# Patient Record
Sex: Male | Born: 1935 | Race: White | Hispanic: No | Marital: Married | Smoking: Never smoker
Health system: Southern US, Community
[De-identification: ages and names within clinical notes are randomized; demographics above are authoritative.]

## PROBLEM LIST (undated history)

## (undated) DIAGNOSIS — I10 Essential (primary) hypertension: Secondary | ICD-10-CM

## (undated) DIAGNOSIS — N400 Enlarged prostate without lower urinary tract symptoms: Secondary | ICD-10-CM

## (undated) DIAGNOSIS — Z9289 Personal history of other medical treatment: Secondary | ICD-10-CM

## (undated) DIAGNOSIS — R972 Elevated prostate specific antigen [PSA]: Secondary | ICD-10-CM

## (undated) DIAGNOSIS — G40909 Epilepsy, unspecified, not intractable, without status epilepticus: Secondary | ICD-10-CM

## (undated) DIAGNOSIS — E785 Hyperlipidemia, unspecified: Secondary | ICD-10-CM

## (undated) DIAGNOSIS — R739 Hyperglycemia, unspecified: Secondary | ICD-10-CM

## (undated) HISTORY — DX: Hyperlipidemia, unspecified: E78.5

## (undated) HISTORY — DX: Hyperglycemia, unspecified: R73.9

## (undated) HISTORY — DX: Benign prostatic hyperplasia without lower urinary tract symptoms: R97.20

## (undated) HISTORY — DX: Epilepsy, unspecified, not intractable, without status epilepticus: G40.909

## (undated) HISTORY — DX: Personal history of other medical treatment: Z92.89

## (undated) HISTORY — PX: TONSILLECTOMY: SUR1361

## (undated) HISTORY — DX: Essential (primary) hypertension: I10

## (undated) HISTORY — DX: Elevated prostate specific antigen (PSA): N40.0

---

## 1979-12-22 HISTORY — PX: OTHER SURGICAL HISTORY: SHX169

## 1983-12-22 HISTORY — PX: OTHER SURGICAL HISTORY: SHX169

## 1990-12-21 DIAGNOSIS — Z9289 Personal history of other medical treatment: Secondary | ICD-10-CM

## 1990-12-21 HISTORY — DX: Personal history of other medical treatment: Z92.89

## 1996-10-21 ENCOUNTER — Encounter: Payer: Self-pay | Admitting: Family Medicine

## 2002-05-21 ENCOUNTER — Encounter: Payer: Self-pay | Admitting: Family Medicine

## 2003-01-21 ENCOUNTER — Encounter: Payer: Self-pay | Admitting: Family Medicine

## 2003-01-21 LAB — CONVERTED CEMR LAB: PSA: 2.4 ng/mL

## 2004-10-27 ENCOUNTER — Ambulatory Visit: Payer: Self-pay | Admitting: Family Medicine

## 2005-01-21 ENCOUNTER — Encounter: Payer: Self-pay | Admitting: Family Medicine

## 2005-02-12 ENCOUNTER — Ambulatory Visit: Payer: Self-pay | Admitting: Family Medicine

## 2005-02-17 ENCOUNTER — Ambulatory Visit: Payer: Self-pay | Admitting: Family Medicine

## 2005-03-10 ENCOUNTER — Ambulatory Visit: Payer: Self-pay | Admitting: Family Medicine

## 2005-05-07 ENCOUNTER — Ambulatory Visit: Payer: Self-pay | Admitting: Ophthalmology

## 2005-05-13 ENCOUNTER — Ambulatory Visit: Payer: Self-pay | Admitting: Ophthalmology

## 2005-05-21 ENCOUNTER — Ambulatory Visit: Payer: Self-pay | Admitting: Family Medicine

## 2005-07-21 ENCOUNTER — Encounter: Payer: Self-pay | Admitting: Family Medicine

## 2005-07-21 LAB — CONVERTED CEMR LAB: PSA: 4.5 ng/mL

## 2005-08-18 ENCOUNTER — Ambulatory Visit: Payer: Self-pay | Admitting: Family Medicine

## 2005-08-20 ENCOUNTER — Ambulatory Visit: Payer: Self-pay | Admitting: Family Medicine

## 2005-10-21 HISTORY — PX: TRANSURETHRAL RESECTION OF PROSTATE: SHX73

## 2005-10-22 ENCOUNTER — Other Ambulatory Visit: Payer: Self-pay

## 2005-10-22 ENCOUNTER — Ambulatory Visit: Payer: Self-pay | Admitting: Urology

## 2005-10-26 ENCOUNTER — Ambulatory Visit: Payer: Self-pay | Admitting: Urology

## 2005-10-31 ENCOUNTER — Inpatient Hospital Stay: Payer: Self-pay | Admitting: Urology

## 2005-10-31 ENCOUNTER — Other Ambulatory Visit: Payer: Self-pay

## 2005-11-09 ENCOUNTER — Ambulatory Visit: Payer: Self-pay | Admitting: Urology

## 2006-01-21 ENCOUNTER — Encounter: Payer: Self-pay | Admitting: Family Medicine

## 2006-01-21 DIAGNOSIS — R739 Hyperglycemia, unspecified: Secondary | ICD-10-CM

## 2006-01-21 HISTORY — DX: Hyperglycemia, unspecified: R73.9

## 2006-01-21 LAB — CONVERTED CEMR LAB: PSA: 2.91 ng/mL

## 2006-02-08 ENCOUNTER — Ambulatory Visit: Payer: Self-pay | Admitting: Family Medicine

## 2006-02-10 ENCOUNTER — Ambulatory Visit: Payer: Self-pay | Admitting: Family Medicine

## 2006-08-10 ENCOUNTER — Ambulatory Visit: Payer: Self-pay | Admitting: Family Medicine

## 2007-02-11 ENCOUNTER — Ambulatory Visit: Payer: Self-pay | Admitting: Family Medicine

## 2007-02-11 LAB — CONVERTED CEMR LAB
ALT: 23 units/L (ref 0–40)
AST: 25 units/L (ref 0–37)
Alkaline Phosphatase: 114 units/L (ref 39–117)
Basophils Relative: 0.4 % (ref 0.0–1.0)
Bilirubin, Direct: 0.1 mg/dL (ref 0.0–0.3)
Calcium: 8.7 mg/dL (ref 8.4–10.5)
Chloride: 105 meq/L (ref 96–112)
Eosinophils Absolute: 0.1 10*3/uL (ref 0.0–0.6)
Eosinophils Relative: 1.3 % (ref 0.0–5.0)
GFR calc non Af Amer: 58 mL/min
Glucose, Bld: 111 mg/dL — ABNORMAL HIGH (ref 70–99)
MCV: 87 fL (ref 78.0–100.0)
PSA: 4.37 ng/mL
Platelets: 176 10*3/uL (ref 150–400)
RBC: 4.47 M/uL (ref 4.22–5.81)
T3, Free: 2.5 pg/mL (ref 2.3–4.2)
TSH: 0.2 microintl units/mL — ABNORMAL LOW (ref 0.35–5.50)
Total CHOL/HDL Ratio: 3.8
Triglycerides: 92 mg/dL (ref 0–149)
VLDL: 18 mg/dL (ref 0–40)
WBC: 5.3 10*3/uL (ref 4.5–10.5)

## 2007-02-15 ENCOUNTER — Ambulatory Visit: Payer: Self-pay | Admitting: Family Medicine

## 2007-03-15 ENCOUNTER — Ambulatory Visit: Payer: Self-pay | Admitting: Family Medicine

## 2007-05-17 ENCOUNTER — Encounter: Payer: Self-pay | Admitting: Family Medicine

## 2007-05-17 DIAGNOSIS — E785 Hyperlipidemia, unspecified: Secondary | ICD-10-CM | POA: Insufficient documentation

## 2007-05-17 DIAGNOSIS — G250 Essential tremor: Secondary | ICD-10-CM | POA: Insufficient documentation

## 2007-05-17 DIAGNOSIS — R972 Elevated prostate specific antigen [PSA]: Secondary | ICD-10-CM

## 2007-05-17 DIAGNOSIS — M4722 Other spondylosis with radiculopathy, cervical region: Secondary | ICD-10-CM

## 2007-05-17 DIAGNOSIS — I10 Essential (primary) hypertension: Secondary | ICD-10-CM | POA: Insufficient documentation

## 2007-05-17 DIAGNOSIS — G252 Other specified forms of tremor: Secondary | ICD-10-CM

## 2007-05-17 DIAGNOSIS — E041 Nontoxic single thyroid nodule: Secondary | ICD-10-CM | POA: Insufficient documentation

## 2007-05-17 DIAGNOSIS — M4712 Other spondylosis with myelopathy, cervical region: Secondary | ICD-10-CM | POA: Insufficient documentation

## 2007-05-17 DIAGNOSIS — N400 Enlarged prostate without lower urinary tract symptoms: Secondary | ICD-10-CM | POA: Insufficient documentation

## 2007-05-17 DIAGNOSIS — E782 Mixed hyperlipidemia: Secondary | ICD-10-CM | POA: Insufficient documentation

## 2007-05-17 DIAGNOSIS — R7989 Other specified abnormal findings of blood chemistry: Secondary | ICD-10-CM | POA: Insufficient documentation

## 2007-05-17 DIAGNOSIS — G40909 Epilepsy, unspecified, not intractable, without status epilepticus: Secondary | ICD-10-CM | POA: Insufficient documentation

## 2007-05-23 ENCOUNTER — Ambulatory Visit: Payer: Self-pay | Admitting: Family Medicine

## 2007-05-23 LAB — CONVERTED CEMR LAB
CO2: 31 meq/L (ref 19–32)
Calcium: 9.2 mg/dL (ref 8.4–10.5)
Chloride: 110 meq/L (ref 96–112)
Creatinine, Ser: 0.9 mg/dL (ref 0.4–1.5)
Glucose, Bld: 92 mg/dL (ref 70–99)

## 2007-05-24 ENCOUNTER — Ambulatory Visit: Payer: Self-pay | Admitting: Family Medicine

## 2008-01-05 ENCOUNTER — Telehealth (INDEPENDENT_AMBULATORY_CARE_PROVIDER_SITE_OTHER): Payer: Self-pay | Admitting: *Deleted

## 2008-01-19 ENCOUNTER — Encounter: Admission: RE | Admit: 2008-01-19 | Discharge: 2008-01-19 | Payer: Self-pay | Admitting: Family Medicine

## 2008-01-19 ENCOUNTER — Ambulatory Visit: Payer: Self-pay | Admitting: Family Medicine

## 2008-01-19 LAB — CONVERTED CEMR LAB
Albumin: 4.2 g/dL (ref 3.5–5.2)
Basophils Absolute: 0 10*3/uL (ref 0.0–0.1)
Bilirubin, Direct: 0.1 mg/dL (ref 0.0–0.3)
Creatinine, Ser: 1.2 mg/dL (ref 0.4–1.5)
Eosinophils Absolute: 0.1 10*3/uL (ref 0.0–0.6)
Eosinophils Relative: 1.1 % (ref 0.0–5.0)
GFR calc non Af Amer: 64 mL/min
Glucose, Bld: 105 mg/dL — ABNORMAL HIGH (ref 70–99)
HCT: 41.6 % (ref 39.0–52.0)
Hemoglobin: 13.9 g/dL (ref 13.0–17.0)
Lipase: 23 units/L (ref 11.0–59.0)
MCHC: 33.5 g/dL (ref 30.0–36.0)
MCV: 88.2 fL (ref 78.0–100.0)
Monocytes Absolute: 0.7 10*3/uL (ref 0.2–0.7)
Neutrophils Relative %: 67.1 % (ref 43.0–77.0)
Potassium: 4 meq/L (ref 3.5–5.1)
Sodium: 141 meq/L (ref 135–145)
Total Bilirubin: 0.8 mg/dL (ref 0.3–1.2)
WBC: 7.6 10*3/uL (ref 4.5–10.5)

## 2008-01-23 ENCOUNTER — Ambulatory Visit: Payer: Self-pay | Admitting: Family Medicine

## 2008-01-23 DIAGNOSIS — B029 Zoster without complications: Secondary | ICD-10-CM | POA: Insufficient documentation

## 2008-01-25 ENCOUNTER — Encounter: Admission: RE | Admit: 2008-01-25 | Discharge: 2008-01-25 | Payer: Self-pay | Admitting: Family Medicine

## 2008-02-22 ENCOUNTER — Ambulatory Visit: Payer: Self-pay | Admitting: Family Medicine

## 2008-02-23 ENCOUNTER — Ambulatory Visit: Payer: Self-pay | Admitting: Family Medicine

## 2008-02-23 LAB — CONVERTED CEMR LAB
ALT: 20 units/L (ref 0–53)
AST: 19 units/L (ref 0–37)
Basophils Relative: 0.6 % (ref 0.0–1.0)
Bilirubin, Direct: 0.1 mg/dL (ref 0.0–0.3)
CO2: 28 meq/L (ref 19–32)
Calcium: 9.3 mg/dL (ref 8.4–10.5)
Chloride: 102 meq/L (ref 96–112)
Cholesterol: 208 mg/dL (ref 0–200)
Direct LDL: 147.4 mg/dL
Eosinophils Relative: 1.4 % (ref 0.0–5.0)
Glucose, Bld: 98 mg/dL (ref 70–99)
HCT: 41.4 % (ref 39.0–52.0)
Hemoglobin: 13.8 g/dL (ref 13.0–17.0)
MCHC: 33.4 g/dL (ref 30.0–36.0)
MCV: 89.8 fL (ref 78.0–100.0)
Monocytes Relative: 9.4 % (ref 3.0–11.0)
Platelets: 198 10*3/uL (ref 150–400)
RBC: 4.6 M/uL (ref 4.22–5.81)
TSH: 0.2 microintl units/mL — ABNORMAL LOW (ref 0.35–5.50)
Total CHOL/HDL Ratio: 5
Total Protein: 6.5 g/dL (ref 6.0–8.3)

## 2008-03-23 ENCOUNTER — Ambulatory Visit: Payer: Self-pay | Admitting: Family Medicine

## 2008-03-23 LAB — CONVERTED CEMR LAB
OCCULT 2: NEGATIVE
OCCULT 3: NEGATIVE

## 2008-03-26 ENCOUNTER — Encounter (INDEPENDENT_AMBULATORY_CARE_PROVIDER_SITE_OTHER): Payer: Self-pay | Admitting: *Deleted

## 2008-12-03 ENCOUNTER — Telehealth: Payer: Self-pay | Admitting: Family Medicine

## 2009-03-01 ENCOUNTER — Ambulatory Visit: Payer: Self-pay | Admitting: Family Medicine

## 2009-03-01 LAB — CONVERTED CEMR LAB
ALT: 22 units/L (ref 0–53)
AST: 29 units/L (ref 0–37)
Albumin: 3.5 g/dL (ref 3.5–5.2)
BUN: 17 mg/dL (ref 6–23)
Basophils Relative: 0.6 % (ref 0.0–3.0)
CO2: 29 meq/L (ref 19–32)
Chloride: 108 meq/L (ref 96–112)
Creatinine, Ser: 0.8 mg/dL (ref 0.4–1.5)
Creatinine,U: 136.7 mg/dL
Eosinophils Absolute: 0.1 10*3/uL (ref 0.0–0.7)
Eosinophils Relative: 1.9 % (ref 0.0–5.0)
GFR calc non Af Amer: 101 mL/min
Glucose, Bld: 93 mg/dL (ref 70–99)
MCV: 87.1 fL (ref 78.0–100.0)
Monocytes Relative: 8.1 % (ref 3.0–12.0)
Neutrophils Relative %: 67 % (ref 43.0–77.0)
RBC: 4.32 M/uL (ref 4.22–5.81)
TSH: 0.23 microintl units/mL — ABNORMAL LOW (ref 0.35–5.50)
Total Protein: 6.1 g/dL (ref 6.0–8.3)
VLDL: 17 mg/dL (ref 0–40)
WBC: 6.7 10*3/uL (ref 4.5–10.5)

## 2009-03-04 ENCOUNTER — Encounter: Payer: Self-pay | Admitting: Family Medicine

## 2009-03-04 LAB — CONVERTED CEMR LAB
PSA, Free Pct: 32 (ref >25)
PSA, Free: 1.8 ng/mL
PSA: 5.68 ng/mL — ABNORMAL HIGH (ref 0.10–4.00)

## 2009-03-05 ENCOUNTER — Ambulatory Visit: Payer: Self-pay | Admitting: Family Medicine

## 2009-03-05 DIAGNOSIS — R972 Elevated prostate specific antigen [PSA]: Secondary | ICD-10-CM | POA: Insufficient documentation

## 2009-04-03 ENCOUNTER — Ambulatory Visit: Payer: Self-pay | Admitting: Family Medicine

## 2009-04-03 ENCOUNTER — Encounter (INDEPENDENT_AMBULATORY_CARE_PROVIDER_SITE_OTHER): Payer: Self-pay | Admitting: *Deleted

## 2009-04-03 LAB — CONVERTED CEMR LAB
OCCULT 1: NEGATIVE
OCCULT 2: NEGATIVE

## 2009-05-29 ENCOUNTER — Ambulatory Visit: Payer: Self-pay | Admitting: Family Medicine

## 2009-06-27 ENCOUNTER — Encounter: Payer: Self-pay | Admitting: Family Medicine

## 2009-07-22 ENCOUNTER — Ambulatory Visit: Payer: Self-pay | Admitting: Family Medicine

## 2009-08-12 ENCOUNTER — Encounter: Payer: Self-pay | Admitting: Family Medicine

## 2009-08-19 ENCOUNTER — Encounter: Payer: Self-pay | Admitting: Family Medicine

## 2010-03-05 ENCOUNTER — Encounter: Payer: Self-pay | Admitting: Family Medicine

## 2010-03-06 ENCOUNTER — Ambulatory Visit: Payer: Self-pay | Admitting: Family Medicine

## 2010-03-06 LAB — CONVERTED CEMR LAB
ALT: 15 units/L (ref 0–53)
Albumin: 3.8 g/dL (ref 3.5–5.2)
Alkaline Phosphatase: 111 units/L (ref 39–117)
Basophils Relative: 0.4 % (ref 0.0–3.0)
Bilirubin, Direct: 0.1 mg/dL (ref 0.0–0.3)
CO2: 29 meq/L (ref 19–32)
Chloride: 107 meq/L (ref 96–112)
Creatinine,U: 109.1 mg/dL
Eosinophils Absolute: 0.2 10*3/uL (ref 0.0–0.7)
Eosinophils Relative: 3.4 % (ref 0.0–5.0)
Free T4: 0.6 ng/dL (ref 0.6–1.6)
Hemoglobin: 12.7 g/dL — ABNORMAL LOW (ref 13.0–17.0)
LDL Cholesterol: 122 mg/dL — ABNORMAL HIGH (ref 0–99)
Lymphocytes Relative: 24.6 % (ref 12.0–46.0)
MCHC: 34.1 g/dL (ref 30.0–36.0)
MCV: 89.1 fL (ref 78.0–100.0)
Microalb Creat Ratio: 9.2 mg/g (ref 0.0–30.0)
Neutro Abs: 4.1 10*3/uL (ref 1.4–7.7)
Neutrophils Relative %: 63.3 % (ref 43.0–77.0)
RBC: 4.2 M/uL — ABNORMAL LOW (ref 4.22–5.81)
Sodium: 141 meq/L (ref 135–145)
Total CHOL/HDL Ratio: 4
Total Protein: 6.6 g/dL (ref 6.0–8.3)
WBC: 6.3 10*3/uL (ref 4.5–10.5)

## 2010-03-09 LAB — CONVERTED CEMR LAB: Vit D, 25-Hydroxy: 26 ng/mL — ABNORMAL LOW (ref 30–89)

## 2010-03-19 ENCOUNTER — Ambulatory Visit: Payer: Self-pay | Admitting: Family Medicine

## 2010-04-04 ENCOUNTER — Ambulatory Visit: Payer: Self-pay | Admitting: Family Medicine

## 2010-04-04 LAB — CONVERTED CEMR LAB
OCCULT 1: NEGATIVE
OCCULT 2: NEGATIVE
OCCULT 3: NEGATIVE

## 2010-04-07 ENCOUNTER — Encounter (INDEPENDENT_AMBULATORY_CARE_PROVIDER_SITE_OTHER): Payer: Self-pay | Admitting: *Deleted

## 2010-07-24 ENCOUNTER — Encounter (INDEPENDENT_AMBULATORY_CARE_PROVIDER_SITE_OTHER): Payer: Self-pay | Admitting: *Deleted

## 2010-09-03 ENCOUNTER — Encounter: Payer: Self-pay | Admitting: Family Medicine

## 2010-12-21 HISTORY — PX: CATARACT EXTRACTION W/ INTRAOCULAR LENS IMPLANT: SHX1309

## 2011-01-21 NOTE — Letter (Signed)
Summary: Dr.Brian Cope,[facility],Note  Dr.Brian Cope,[facility],Note   Imported By: Beau Fanny 03/10/2010 16:51:59  _____________________________________________________________________  External Attachment:    Type:   Image     Comment:   External Document

## 2011-01-21 NOTE — Letter (Signed)
Summary: [facility]  [facility]   Imported By: Sherian Rein 09/09/2010 14:43:34  _____________________________________________________________________  External Attachment:    Type:   Image     Comment:   External Document  Appended Document: [facility]    Clinical Lists Changes  Observations: Added new observation of PAST MED HX: Hyperlipidemia Hypertension Benign prostatic hypertrophy and elevated PSA per Dr. Achilles Dunk Seizure disorder Elevated glucose- 102 (01/2006)  (09/10/2010 15:33)       Past History:  Past Medical History: Hyperlipidemia Hypertension Benign prostatic hypertrophy and elevated PSA per Dr. Achilles Dunk Seizure disorder Elevated glucose- 102 (01/2006)

## 2011-01-21 NOTE — Letter (Signed)
Summary: Nadara Eaton letter  [place] [facility]  [address] [place], [place] [address]   Phone: [phone]  Fax: [phone]       07/24/2010 MRN: [id number]  JAMIEON LANNEN [address] [place], [place]  [address]  Dear Mr. [facility] [place] [place] announce the retirement of Arta Silence, M.D., from full-time practice at the [facility] office effective June 19, 2010 and his plans of returning part-time.  It is important to Dr. Hetty Ely and to our practice that you understand that [facility] has seven physicians in our office for your health care needs.  We will continue to offer the same exceptional care that you have today.    Dr. Hetty Ely has spoken to many of you about his plans for retirement and returning part-time in the fall.   We will continue to work with you through the transition to schedule appointments for you in the office and meet the high standards that [place] is committed to.   Again, it is with great pleasure that we share the news that Dr. Hetty Ely will return to [facility] at [facility] in October of 2011 with a reduced schedule.    If you have any questions, or would like to request an appointment with one of our physicians, please call us at [phone] and press the option for Scheduling an appointment.  We take pleasure in providing you with excellent patient care and look forward to seeing you at your next office visit.  Our [facility] Physicians are:  Tillman Abide, M.D. Laurita Quint, M.D. Roxy Manns, M.D. Kerby Nora, M.D. Hannah Beat, M.D. Ruthe Mannan, M.D. We proudly welcomed Raechel Ache, M.D. and Eustaquio Boyden, M.D. to the practice in July/August 2011.  Sincerely,  [facility]

## 2011-01-21 NOTE — Assessment & Plan Note (Signed)
Summary: cpx/rbh  R/S FROM 03/10/10   Vital Signs:  Patient profile:   76 year old male Weight:      184.75 pounds Temp:     98.4 degrees F oral Pulse rate:   72 / minute Pulse rhythm:   regular BP sitting:   128 / 70  (left arm) Cuff size:   regular  Vitals Entered By: Sydell Axon LPN (Apr 14, 2010 8:15 AM) CC: 30 Minute checkup, hemoccult cards given to patient   History of Present Illness: Pt here for Comp Exam.  Had a dry cough that is improving with no trmt. He helped feed people and thereafter got a cold again, which is what he is now getting over. He feels well and has no complaints. He has done well with Dr Achilles Dunk and is very pleased with hisw trmt. He is currently on Avodart, shrinking his prostate.   Preventive Screening-Counseling & Management  Alcohol-Tobacco     Alcohol drinks/day: 0     Smoking Status: never     Passive Smoke Exposure: no  Caffeine-Diet-Exercise     Caffeine use/day: 4     Caffeine Counseling: decrease use of caffeine     Does Patient Exercise: yes     Type of exercise: lawnwork and stairs.     Times/week: 7  Problems Prior to Update: 1)  Elevated Prostate Specific Antigen  (ICD-790.93) 2)  Special Screening Malig Neoplasms Other Sites  (ICD-V76.49) 3)  Health Maintenance Exam  (ICD-V70.0) 4)  Herpes Zoster  (ICD-053.9) 5)  Hyperglycemia  (ICD-790.6) 6)  Hyperlipidemia  (ICD-272.4) 7)  Hx of Tremor, Essential  (ICD-333.1) 8)  Hx of Radiculopathy  (ICD-729.2) 9)  Seizure Disorder  (ICD-780.39) 10)  Hx of Thyroid Nodule  (ICD-241.0) 11)  Hx of Erectile Dysfunction Resolved  (ICD-302.72) 12)  Benign Prostatic Hypertrophy  (ICD-600.00) 13)  Hypertension  (ICD-401.9)  Medications Prior to Update: 1)  Dilantin 100 Mg Caps (Phenytoin Sodium Extended) .... Take Two By Mouth Twice A Day 2)  Fish Oil  Caps (Omega-3 Fatty Acids Caps) .... Take By Mouth As Directed 3)  Altace 10 Mg  Caps (Ramipril) .... One Tab By Mouth Once Daily 4)  Terazosin  Hcl 10 Mg  Caps (Terazosin Hcl) .... One Tab By Mouth Daily 5)  Hydrochlorothiazide 12.5 Mg Tabs (Hydrochlorothiazide) .... One Tab By Mouth in Am  Allergies: No Known Drug Allergies  Past History:  Past Medical History: Last updated: 05/17/2007 Hyperlipidemia Hypertension Benign prostatic hypertrophy Seizure disorder Elevated glucose- 102 (01/2006)  Past Surgical History: Last updated: 05/17/2007 Tonsillectomy  HOSP Observation for seizure 1981 CT brain- normal (1992) Elevated PSA (2.4)  10/1996 EEG- normal (1985) Laser TURP  (10/2005)  Family History: Last updated: Apr 14, 2010 Father: died age 51- unknown causes Mother: died age 25- natural causes Sister A 30 (Esytranged)  Febrile seizure  Sister dec 57  lung cancer/smoker  Social History: Last updated: 02/23/2008 Marital Status: Married since 1959 Children: 2 daughters Occupation: Software engineer, retired 2001  Risk Factors: Alcohol Use: 0 (2010-04-14) Caffeine Use: 4 (04-14-2010) Exercise: yes (April 14, 2010)  Risk Factors: Smoking Status: never (04/14/2010) Passive Smoke Exposure: no (Apr 14, 2010)  Family History: Father: died age 59- unknown causes Mother: died age 6- natural causes Sister A 12 (Esytranged)  Febrile seizure  Sister dec 57  lung cancer/smoker  Review of Systems General:  Denies chills, fatigue, fever, sweats, weakness, and weight loss. Eyes:  Complains of blurring; denies discharge, double vision, and eye pain. ENT:  Denies  decreased hearing, earache, hoarseness, and ringing in ears. CV:  Complains of fatigue; denies chest pain or discomfort, fainting, palpitations, shortness of breath with exertion, and swelling of feet; mild more than usual.. Resp:  Denies coughing up blood, shortness of breath, and wheezing. GI:  Denies abdominal pain, bloody stools, change in bowel habits, constipation, dark tarry stools, diarrhea, indigestion, loss of appetite, nausea, vomiting, vomiting blood, and  yellowish skin color. GU:  Complains of nocturia; denies discharge, dysuria, and urinary frequency. MS:  Denies joint pain, low back pain, cramps, muscle weakness, and stiffness. Derm:  Denies dryness, itching, and rash. Neuro:  Denies numbness, poor balance, seizures, tingling, and tremors.  Physical Exam  General:  Well-developed,well-nourished,in no acute distress; alert,appropriate and cooperative throughout examination. Head:  Normocephalic and atraumatic without obvious abnormalities. No apparent alopecia or balding. Sinuses NT. Eyes:  Conjunctiva clear bilaterally.  Ears:  External ear exam shows no significant lesions or deformities.  Otoscopic examination reveals clear canals, tympanic membranes are intact bilaterally without bulging, retraction, inflammation or discharge. Hearing is grossly normal bilaterally. Nose:  External nasal examination shows no deformity or inflammation. Nasal mucosa are pink and moist without lesions or exudates. Mouth:  Oral mucosa and oropharynx without lesions or exudates.  Teeth in good repair. Neck:  No deformities, masses, or tenderness noted. Chest Wall:  No deformities, masses, tenderness or gynecomastia noted. Breasts:  No masses or gynecomastia noted Lungs:  Normal respiratory effort, chest expands symmetrically. Lungs are clear to auscultation, no crackles or wheezes. Heart:  Normal rate and regular rhythm. S1 and S2 normal without gallop, murmur, click, rub or other extra sounds. Abdomen:  Bowel sounds positive,abdomen soft and non-tender without masses, organomegaly or hernias noted. Area of Zoster totally healed and difficult to appreciate. Rectal:  Declined as just done by Dr Achilles Dunk. Genitalia:  Testes bilaterally descended without nodularity, tenderness or masses. No scrotal masses or lesions. No penis lesions or urethral discharge. Prostate:  Declined as just done by Dr Achilles Dunk. Msk:  No deformity or scoliosis noted of thoracic or lumbar spine.    Pulses:  R and L carotid,radial,femoral,dorsalis pedis and posterior tibial pulses are full and equal bilaterally Extremities:  No clubbing, cyanosis, edema, or deformity noted with normal full range of motion of all joints.   Neurologic:  No cranial nerve deficits noted. Station and gait are normal. Sensory, motor and coordinative functions appear intact. Minimal tremor of outstretched hands. Skin:  Intact without suspicious lesions or rashes Cervical Nodes:  No lymphadenopathy noted Inguinal Nodes:  No significant adenopathy Psych:  Cognition and judgment appear intact. Alert and cooperative with normal attention span and concentration. No apparent delusions, illusions, hallucinations   Impression & Recommendations:  Problem # 1:  HEALTH MAINTENANCE EXAM (ICD-V70.0)  Discussed getting Zostavax. Given Td and Pneumonia.  Problem # 2:  ELEVATED PROSTATE SPECIFIC ANTIGEN (ICD-790.93) Assessment: Improved Better today but on Avodart.  Problem # 3:  HERPES ZOSTER (ICD-053.9) Assessment: Improved Resolved. Suggested immun.  Problem # 4:  HYPERGLYCEMIA (ICD-790.6) Assessment: Improved Euglycemic today.  Problem # 5:  HYPERLIPIDEMIA (ICD-272.4) Assessment: Unchanged Acceptable but always want LDL as low as poss. Avoid fatty foods. Labs Reviewed: SGOT: 18 (03/06/2010)   SGPT: 15 (03/06/2010)   HDL:46.70 (03/06/2010), 39.5 (03/01/2009)  LDL:122 (03/06/2010), 126 (03/01/2009)  Chol:200 (03/06/2010), 183 (03/01/2009)  Trig:158.0 (03/06/2010), 87 (03/01/2009)  Problem # 6:  Hx of TREMOR, ESSENTIAL (ICD-333.1) Assessment: Unchanged Stable.  Problem # 7:  SEIZURE DISORDER (ICD-780.39) Assessment: Unchanged Stable...no seizures in many  years. Cont Dilantin. His updated medication list for this problem includes:    Dilantin 100 Mg Caps (Phenytoin sodium extended) .Marland Kitchen... Take two by mouth twice a day  Problem # 8:  BENIGN PROSTATIC HYPERTROPHY (ICD-600.00) Assessment: Improved Sxs  improved.  Problem # 9:  HYPERTENSION (ICD-401.9) Assessment: Unchanged Stable. Cont curr meds. His updated medication list for this problem includes:    Altace 10 Mg Caps (Ramipril) ..... One tab by mouth once daily    Terazosin Hcl 10 Mg Caps (Terazosin hcl) ..... One tab by mouth daily    Hydrochlorothiazide 12.5 Mg Tabs (Hydrochlorothiazide) ..... One tab by mouth in am  BP today: 128/70 Prior BP: 130/64 (07/22/2009)  Labs Reviewed: K+: 4.0 (03/06/2010) Creat: : 1.1 (03/06/2010)   Chol: 200 (03/06/2010)   HDL: 46.70 (03/06/2010)   LDL: 122 (03/06/2010)   TG: 158.0 (03/06/2010)  Complete Medication List: 1)  Dilantin 100 Mg Caps (Phenytoin sodium extended) .... Take two by mouth twice a day 2)  Fish Oil Caps (Omega-3 fatty acids caps) .... Take by mouth as directed 3)  Altace 10 Mg Caps (Ramipril) .... One tab by mouth once daily 4)  Terazosin Hcl 10 Mg Caps (Terazosin hcl) .... One tab by mouth daily 5)  Hydrochlorothiazide 12.5 Mg Tabs (Hydrochlorothiazide) .... One tab by mouth in am 6)  Avodart 0.5 Mg Caps (Dutasteride) .... Take one by mouth every other day  Other Orders: TD Toxoids IM 7 YR + (14782) Admin 1st Vaccine (95621) Pneumococcal Vaccine (30865) Admin of Any Addtl Vaccine (78469)  Patient Instructions: 1)  Please ask Shirlee Limerick about an Training and development officer for pt in [place].  Prescriptions: ALTACE 10 MG  CAPS (RAMIPRIL) one tab by mouth once daily  #90 x 03   Entered and Authorized by:   Shaune Leeks MD   Signed by:   Shaune Leeks MD on 03/19/2010   Method used:   Print then Give to Patient   RxID:   [id number] HYDROCHLOROTHIAZIDE 12.5 MG TABS (HYDROCHLOROTHIAZIDE) one tab by mouth in AM  #90 x 3   Entered by:   Sydell Axon LPN   Authorized by:   Shaune Leeks MD   Signed by:   Sydell Axon LPN on 72/53/6644   Method used:   Print then Give to Patient   RxID:   [id number] TERAZOSIN HCL 10 MG  CAPS (TERAZOSIN HCL) one tab by  mouth daily  #90 x 3   Entered by:   Sydell Axon LPN   Authorized by:   Shaune Leeks MD   Signed by:   Sydell Axon LPN on 33/29/5188   Method used:   Print then Give to Patient   RxID:   [id number] DILANTIN 100 MG CAPS (PHENYTOIN SODIUM EXTENDED) take two by mouth twice a day  #360 x 3   Entered by:   Sydell Axon LPN   Authorized by:   Shaune Leeks MD   Signed by:   Sydell Axon LPN on 35/57/3220   Method used:   Print then Give to Patient   RxID:   [id number]   Current Allergies (reviewed today): No known allergies    Tetanus/Td Vaccine    Vaccine Type: Td    Site: left deltoid    Mfr: Sanofi Pasteur    Dose: 0.5 ml    Route: IM    Given by: Sydell Axon LPN    Exp. Date: 11/05/2011    Lot #: [id number]    VIS given: 11/08/07  version given March 19, 2010.  Pneumovax Vaccine    Vaccine Type: Pneumovax (Medicare)    Site: right deltoid    Mfr: Merck    Dose: 0.5 ml    Route: IM    Given by: Sydell Axon LPN    Exp. Date: 08/04/2011    Lot #: [id number]    VIS given: 07/18/96 version given March 19, 2010.

## 2011-01-21 NOTE — Letter (Signed)
Summary: Results Follow up Letter  [place] [facility]  [address] [place], [place] [address]   Phone: [phone]  Fax: [phone]    04/07/2010 MRN: [id number]    Mark Holmes [address] [place], [place]  [address]    Dear Mr. Holderman,  The following are the results of your recent test(s):  Test         Result    Pap Smear:        Normal _____  Not Normal _____ Comments: ______________________________________________________ Cholesterol: LDL(Bad cholesterol):         Your goal is less than:         HDL (Good cholesterol):       Your goal is more than: Comments:  ______________________________________________________ Mammogram:        Normal _____  Not Normal _____ Comments:  ___________________________________________________________________ Hemoccult:        Normal __X___  Not normal _______ Comments:    Yearly follow up is recommended.   _____________________________________________________________________ Other Tests:    We routinely do not discuss normal results over the telephone.  If you desire a copy of the results, or you have any questions about this information we can discuss them at your next office visit.   Sincerely,    Arta Silence, M.D.  RNS:lsf

## 2011-02-27 ENCOUNTER — Telehealth: Payer: Self-pay | Admitting: Family Medicine

## 2011-03-02 ENCOUNTER — Encounter: Payer: Self-pay | Admitting: Family Medicine

## 2011-03-03 NOTE — Progress Notes (Signed)
Summary: needs written script for dilantin  Phone Note Refill Request Call back at Home Phone [phone] Message from:  Patient  Refills Requested: Medication #1:  DILANTIN 100 MG CAPS take two by mouth twice a day Pt is asking for written script for mail order.  He will pick up, please call when ready.  Initial call taken by: Lowella Petties CMA, AAMA,  February 27, 2011 9:04 AM  Follow-up for Phone Call        please give to patient.  Follow-up by: Crawford Givens MD,  February 27, 2011 10:14 AM  Additional Follow-up for Phone Call Additional follow up Details #1::        Patient Advised. Prescription left at front desk.  Additional Follow-up by: Delilah Shan CMA Nagi Furio Dull),  February 27, 2011 10:35 AM    Prescriptions: DILANTIN 100 MG CAPS (PHENYTOIN SODIUM EXTENDED) take two by mouth twice a day  #360 x 3   Entered and Authorized by:   Crawford Givens MD   Signed by:   Crawford Givens MD on 02/27/2011   Method used:   Print then Give to Patient   RxID:   [id number]

## 2011-03-10 NOTE — Letter (Signed)
Summary: [facility]  [facility]   Imported By: Lanelle Bal 03/05/2011 12:41:15  _____________________________________________________________________  External Attachment:    Type:   Image     Comment:   External Document

## 2011-04-11 ENCOUNTER — Encounter: Payer: Self-pay | Admitting: Family Medicine

## 2011-05-07 ENCOUNTER — Other Ambulatory Visit: Payer: Self-pay | Admitting: Family Medicine

## 2011-05-07 DIAGNOSIS — R569 Unspecified convulsions: Secondary | ICD-10-CM

## 2011-05-07 DIAGNOSIS — R7989 Other specified abnormal findings of blood chemistry: Secondary | ICD-10-CM

## 2011-05-07 DIAGNOSIS — E041 Nontoxic single thyroid nodule: Secondary | ICD-10-CM

## 2011-05-07 DIAGNOSIS — E785 Hyperlipidemia, unspecified: Secondary | ICD-10-CM

## 2011-05-07 DIAGNOSIS — R972 Elevated prostate specific antigen [PSA]: Secondary | ICD-10-CM

## 2011-05-07 DIAGNOSIS — I10 Essential (primary) hypertension: Secondary | ICD-10-CM

## 2011-05-11 ENCOUNTER — Other Ambulatory Visit (INDEPENDENT_AMBULATORY_CARE_PROVIDER_SITE_OTHER): Payer: 59 | Admitting: Family Medicine

## 2011-05-11 DIAGNOSIS — E785 Hyperlipidemia, unspecified: Secondary | ICD-10-CM

## 2011-05-11 DIAGNOSIS — I10 Essential (primary) hypertension: Secondary | ICD-10-CM

## 2011-05-11 DIAGNOSIS — E041 Nontoxic single thyroid nodule: Secondary | ICD-10-CM

## 2011-05-11 DIAGNOSIS — R569 Unspecified convulsions: Secondary | ICD-10-CM

## 2011-05-11 DIAGNOSIS — R7989 Other specified abnormal findings of blood chemistry: Secondary | ICD-10-CM

## 2011-05-11 DIAGNOSIS — R972 Elevated prostate specific antigen [PSA]: Secondary | ICD-10-CM

## 2011-05-11 LAB — CBC WITH DIFFERENTIAL/PLATELET
Basophils Relative: 0.4 % (ref 0.0–3.0)
Eosinophils Relative: 1.2 % (ref 0.0–5.0)
HCT: 37.5 % — ABNORMAL LOW (ref 39.0–52.0)
Hemoglobin: 12.9 g/dL — ABNORMAL LOW (ref 13.0–17.0)
Lymphs Abs: 1.4 10*3/uL (ref 0.7–4.0)
MCV: 90.6 fl (ref 78.0–100.0)
Monocytes Absolute: 0.5 10*3/uL (ref 0.1–1.0)
Monocytes Relative: 7.6 % (ref 3.0–12.0)
Platelets: 212 10*3/uL (ref 150.0–400.0)
RBC: 4.14 Mil/uL — ABNORMAL LOW (ref 4.22–5.81)
WBC: 6.9 10*3/uL (ref 4.5–10.5)

## 2011-05-11 LAB — HEPATIC FUNCTION PANEL
Alkaline Phosphatase: 92 U/L (ref 39–117)
Bilirubin, Direct: 0 mg/dL (ref 0.0–0.3)
Total Bilirubin: 0.5 mg/dL (ref 0.3–1.2)
Total Protein: 6.1 g/dL (ref 6.0–8.3)

## 2011-05-11 LAB — LIPID PANEL
LDL Cholesterol: 101 mg/dL — ABNORMAL HIGH (ref 0–99)
Total CHOL/HDL Ratio: 4
VLDL: 29.8 mg/dL (ref 0.0–40.0)

## 2011-05-11 LAB — RENAL FUNCTION PANEL
Albumin: 3.7 g/dL (ref 3.5–5.2)
CO2: 26 mEq/L (ref 19–32)
Calcium: 9.1 mg/dL (ref 8.4–10.5)
Creatinine, Ser: 1 mg/dL (ref 0.4–1.5)
Sodium: 139 mEq/L (ref 135–145)

## 2011-05-12 LAB — DIGOXIN LEVEL: Digoxin Level: 0.2 ng/mL — ABNORMAL LOW (ref 0.8–2.0)

## 2011-05-14 ENCOUNTER — Encounter: Payer: Self-pay | Admitting: Family Medicine

## 2011-05-14 ENCOUNTER — Ambulatory Visit (INDEPENDENT_AMBULATORY_CARE_PROVIDER_SITE_OTHER): Payer: 59 | Admitting: Family Medicine

## 2011-05-14 DIAGNOSIS — E785 Hyperlipidemia, unspecified: Secondary | ICD-10-CM

## 2011-05-14 DIAGNOSIS — G252 Other specified forms of tremor: Secondary | ICD-10-CM

## 2011-05-14 DIAGNOSIS — R7989 Other specified abnormal findings of blood chemistry: Secondary | ICD-10-CM

## 2011-05-14 DIAGNOSIS — R972 Elevated prostate specific antigen [PSA]: Secondary | ICD-10-CM

## 2011-05-14 DIAGNOSIS — E041 Nontoxic single thyroid nodule: Secondary | ICD-10-CM

## 2011-05-14 DIAGNOSIS — I10 Essential (primary) hypertension: Secondary | ICD-10-CM

## 2011-05-14 DIAGNOSIS — G250 Essential tremor: Secondary | ICD-10-CM

## 2011-05-14 DIAGNOSIS — N400 Enlarged prostate without lower urinary tract symptoms: Secondary | ICD-10-CM

## 2011-05-14 MED ORDER — RAMIPRIL 10 MG PO CAPS: 10.0000 mg | ORAL_CAPSULE | Freq: Every day | ORAL | Status: DC

## 2011-05-14 MED ORDER — HYDROCHLOROTHIAZIDE 12.5 MG PO CAPS: 12.5000 mg | ORAL_CAPSULE | ORAL | Status: DC

## 2011-05-14 MED ORDER — TERAZOSIN HCL 10 MG PO CAPS: 10.0000 mg | ORAL_CAPSULE | Freq: Every day | ORAL | Status: DC

## 2011-05-14 NOTE — Assessment & Plan Note (Signed)
Stable

## 2011-05-14 NOTE — Assessment & Plan Note (Signed)
Euglycemic today. 

## 2011-05-14 NOTE — Assessment & Plan Note (Signed)
Followed by Dr Achilles Dunk. PSA doubled is stable.

## 2011-05-14 NOTE — Assessment & Plan Note (Signed)
Mild elevation today. He monitors at home and is typically lower. He has congestion today and has been taking cough medication regularly. Will cont to observe.

## 2011-05-14 NOTE — Assessment & Plan Note (Signed)
Good nos. Cont curr med. Lab Results  Component Value Date   CHOL 171 05/11/2011   CHOL 200 03/06/2010   CHOL 183 03/01/2009   Lab Results  Component Value Date   HDL 40.00 05/11/2011   HDL 16.10 03/06/2010   HDL 96.0 03/01/2009   Lab Results  Component Value Date   LDLCALC 101* 05/11/2011   LDLCALC 122* 03/06/2010   LDLCALC 126* 03/01/2009   Lab Results  Component Value Date   TRIG 149.0 05/11/2011   TRIG 158.0* 03/06/2010   TRIG 87 03/01/2009   Lab Results  Component Value Date   CHOLHDL 4 05/11/2011   CHOLHDL 4 03/06/2010   CHOLHDL 4.6 CALC 03/01/2009   Lab Results  Component Value Date   LDLDIRECT 147.4 02/22/2008

## 2011-05-14 NOTE — Assessment & Plan Note (Signed)
Stable. Will cont to monitor.

## 2011-05-14 NOTE — Progress Notes (Signed)
  Subjective:    Patient ID: Mark Holmes, male    DOB: 19-Oct-1935, 76 y.o.   MRN: [id number]  HPI Pt here for Comp Exam. He feels well and has no complaints. He is congested and has a cough his daughter brought with her.    Review of Systems  Constitutional: Negative for fever, chills, diaphoresis, appetite change, fatigue and unexpected weight change.  HENT: Negative for hearing loss, ear pain, tinnitus and ear discharge.   Eyes: Negative for pain, discharge and visual disturbance.  Respiratory: Negative for cough, shortness of breath and wheezing.   Cardiovascular: Negative for chest pain and palpitations.       No SOB w/ exertion  Gastrointestinal: Negative for nausea, vomiting, abdominal pain, diarrhea, constipation and blood in stool.       No heartburn or swallowing problems.  Genitourinary: Negative for dysuria, frequency and difficulty urinating.       No nocturia Weak stream.  Musculoskeletal: Negative for myalgias, back pain and arthralgias.  Skin: Negative for rash.       No itching or dryness.  Neurological: Negative for tremors and numbness.       No tingling or balance problems.  Hematological: Negative for adenopathy. Does not bruise/bleed easily.  Psychiatric/Behavioral: Negative for dysphoric mood and agitation.       Objective:   Physical Exam  Constitutional: He is oriented to person, place, and time. He appears well-developed and well-nourished. No distress.  HENT:  Head: Normocephalic and atraumatic.  Right Ear: External ear normal.  Left Ear: External ear normal.  Nose: Nose normal.  Mouth/Throat: Oropharynx is clear and moist.  Eyes: Conjunctivae and EOM are normal. Pupils are equal, round, and reactive to light. Right eye exhibits no discharge. Left eye exhibits no discharge. No scleral icterus.  Neck: Normal range of motion. Neck supple. No thyromegaly present.  Cardiovascular: Normal rate, regular rhythm, normal heart sounds and intact distal  pulses.   No murmur heard. Pulmonary/Chest: Effort normal and breath sounds normal. No respiratory distress. He has no wheezes.  Abdominal: Soft. Bowel sounds are normal. He exhibits no distension and no mass. There is no tenderness. There is no rebound and no guarding.  Genitourinary:       Done by Dr Achilles Dunk, [facility].  Musculoskeletal: Normal range of motion. He exhibits no edema.  Lymphadenopathy:    He has no cervical adenopathy.  Neurological: He is alert and oriented to person, place, and time. Coordination normal.  Skin: Skin is warm and dry. No rash noted. He is not diaphoretic.  Psychiatric: He has a normal mood and affect. His behavior is normal. Judgment and thought content normal.          Assessment & Plan:  HMPE  I have personally reviewed the Medicare Annual Wellness questionnaire and have noted 1. The patient's medical and social history 2. Their use of alcohol, tobacco or illicit drugs 3. Their current medications and supplements 4. The patient's functional ability including ADL's, fall risks, home safety risks and hearing or visual             impairment. 5. Diet and physical activities 6. Evidence for depression or mood disorders

## 2011-05-14 NOTE — Patient Instructions (Addendum)
Take Guaifenesin (400mg ), take 11/2 tabs by mouth AM and NOON. Get GUAIFENESIN by  [facility] to [facility] or [facility] and getting MUCOUS RELIEF EXPECTORANT/CONGESTION. DO NOT GET MUCINEX (Timed Release Guaifenesin) RTRC as needed, otherwise one year. Needs iFOB when next seen.

## 2011-05-15 NOTE — Progress Notes (Signed)
Addended by: Baldomero Lamy on: 05/15/2011 07:14 AM   Modules accepted: Orders

## 2012-02-01 ENCOUNTER — Other Ambulatory Visit: Payer: Self-pay | Admitting: *Deleted

## 2012-02-01 MED ORDER — PHENYTOIN SODIUM EXTENDED 100 MG PO CAPS: ORAL_CAPSULE | ORAL | Status: DC

## 2012-02-01 MED ORDER — HYDROCHLOROTHIAZIDE 12.5 MG PO CAPS: 12.5000 mg | ORAL_CAPSULE | ORAL | Status: DC

## 2012-02-01 MED ORDER — TERAZOSIN HCL 10 MG PO CAPS: 10.0000 mg | ORAL_CAPSULE | Freq: Every day | ORAL | Status: DC

## 2012-02-01 MED ORDER — RAMIPRIL 10 MG PO CAPS: 10.0000 mg | ORAL_CAPSULE | Freq: Every day | ORAL | Status: DC

## 2012-02-01 NOTE — Telephone Encounter (Signed)
Ready. Placed in kim's box.

## 2012-02-01 NOTE — Telephone Encounter (Signed)
Patient would like written scripts for Dilantin,Altace,and Terazosin for mail order. He also would like a written script for HCTZ for local fill at [facility]. I advised we could send everything electronically and he still wanted the written scripts. Please notify patient when ready for pickup.

## 2012-02-02 NOTE — Telephone Encounter (Signed)
Patient notified and Rx placed up front for pick up. 

## 2012-03-13 ENCOUNTER — Encounter: Payer: Self-pay | Admitting: Family Medicine

## 2012-05-20 ENCOUNTER — Other Ambulatory Visit: Payer: Self-pay | Admitting: Family Medicine

## 2012-05-20 DIAGNOSIS — R972 Elevated prostate specific antigen [PSA]: Secondary | ICD-10-CM

## 2012-05-20 DIAGNOSIS — E785 Hyperlipidemia, unspecified: Secondary | ICD-10-CM

## 2012-05-20 DIAGNOSIS — R569 Unspecified convulsions: Secondary | ICD-10-CM

## 2012-05-23 ENCOUNTER — Other Ambulatory Visit (INDEPENDENT_AMBULATORY_CARE_PROVIDER_SITE_OTHER): Payer: 59

## 2012-05-23 DIAGNOSIS — R569 Unspecified convulsions: Secondary | ICD-10-CM

## 2012-05-23 DIAGNOSIS — I10 Essential (primary) hypertension: Secondary | ICD-10-CM

## 2012-05-23 DIAGNOSIS — R972 Elevated prostate specific antigen [PSA]: Secondary | ICD-10-CM

## 2012-05-23 DIAGNOSIS — E785 Hyperlipidemia, unspecified: Secondary | ICD-10-CM

## 2012-05-23 LAB — LIPID PANEL
Cholesterol: 173 mg/dL (ref 0–200)
HDL: 38.4 mg/dL — ABNORMAL LOW (ref >39.00)
LDL Cholesterol: 114 mg/dL — ABNORMAL HIGH (ref 0–99)
Total CHOL/HDL Ratio: 5
Triglycerides: 102 mg/dL (ref 0.0–149.0)
VLDL: 20.4 mg/dL (ref 0.0–40.0)

## 2012-05-23 LAB — CBC WITH DIFFERENTIAL/PLATELET
Basophils Absolute: 0 10*3/uL (ref 0.0–0.1)
Eosinophils Absolute: 0.1 10*3/uL (ref 0.0–0.7)
HCT: 37.9 % — ABNORMAL LOW (ref 39.0–52.0)
Hemoglobin: 12.7 g/dL — ABNORMAL LOW (ref 13.0–17.0)
Lymphs Abs: 1.3 10*3/uL (ref 0.7–4.0)
MCHC: 33.4 g/dL (ref 30.0–36.0)
MCV: 89.8 fl (ref 78.0–100.0)
Monocytes Absolute: 0.4 10*3/uL (ref 0.1–1.0)
Monocytes Relative: 7.3 % (ref 3.0–12.0)
Neutro Abs: 3.9 10*3/uL (ref 1.4–7.7)
Platelets: 221 10*3/uL (ref 150.0–400.0)
RDW: 13.7 % (ref 11.5–14.6)

## 2012-05-23 LAB — COMPREHENSIVE METABOLIC PANEL
ALT: 15 U/L (ref 0–53)
AST: 15 U/L (ref 0–37)
Albumin: 3.6 g/dL (ref 3.5–5.2)
Alkaline Phosphatase: 99 U/L (ref 39–117)
BUN: 26 mg/dL — ABNORMAL HIGH (ref 6–23)
CO2: 28 mEq/L (ref 19–32)
Calcium: 9.2 mg/dL (ref 8.4–10.5)
Chloride: 105 mEq/L (ref 96–112)
Creatinine, Ser: 1.2 mg/dL (ref 0.4–1.5)
GFR: 65.97 mL/min (ref >60.00)
Glucose, Bld: 88 mg/dL (ref 70–99)
Potassium: 4.4 mEq/L (ref 3.5–5.1)
Sodium: 140 mEq/L (ref 135–145)
Total Bilirubin: 0.5 mg/dL (ref 0.3–1.2)
Total Protein: 6.3 g/dL (ref 6.0–8.3)

## 2012-05-23 LAB — PSA: PSA: 2.63 ng/mL (ref 0.10–4.00)

## 2012-05-26 ENCOUNTER — Encounter: Payer: 59 | Admitting: Family Medicine

## 2012-05-26 LAB — PHENYTOIN LEVEL, FREE AND TOTAL: Phenytoin Bound: 8.7 mg/L

## 2012-05-31 ENCOUNTER — Encounter: Payer: Self-pay | Admitting: Family Medicine

## 2012-05-31 ENCOUNTER — Ambulatory Visit (INDEPENDENT_AMBULATORY_CARE_PROVIDER_SITE_OTHER): Payer: 59 | Admitting: Family Medicine

## 2012-05-31 VITALS — BP 118/64 | HR 76 | Temp 97.9°F | Ht 71.0 in | Wt 175.0 lb

## 2012-05-31 DIAGNOSIS — R972 Elevated prostate specific antigen [PSA]: Secondary | ICD-10-CM

## 2012-05-31 DIAGNOSIS — R569 Unspecified convulsions: Secondary | ICD-10-CM

## 2012-05-31 DIAGNOSIS — E041 Nontoxic single thyroid nodule: Secondary | ICD-10-CM

## 2012-05-31 DIAGNOSIS — Z1211 Encounter for screening for malignant neoplasm of colon: Secondary | ICD-10-CM

## 2012-05-31 DIAGNOSIS — I10 Essential (primary) hypertension: Secondary | ICD-10-CM

## 2012-05-31 DIAGNOSIS — Z0000 Encounter for general adult medical examination without abnormal findings: Secondary | ICD-10-CM | POA: Insufficient documentation

## 2012-05-31 NOTE — Patient Instructions (Signed)
Stool kit today. If chest discomfort returns, please return to see [place]. Good to see you today, call us with questions.

## 2012-05-31 NOTE — Assessment & Plan Note (Signed)
Stable PSA.  DRE deferred as to see Cope at end of this month.

## 2012-05-31 NOTE — Assessment & Plan Note (Addendum)
I have personally reviewed the Medicare Annual Wellness questionnaire and have noted 1. The patient's medical and social history 2. Their use of alcohol, tobacco or illicit drugs 3. Their current medications and supplements 4. The patient's functional ability including ADL's, fall risks, home safety risks and hearing or visual impairment. 5. Diet and physical activity 6. Evidence for depression or mood disorders The patients weight, height, BMI have been recorded in the chart.  Hearing and vision has been addressed. I have made referrals, counseling and provided education to the patient based review of the above and I have provided the pt with a written personalized care plan for preventive services. See scanned questionairre. Advanced directives discussed: has at home, Mark Holmes is wife  Reviewed preventative protocols and updated unless pt declined. Will consider shingles shot. Discussed healthy diet, lifestyle. rec schedule vision exam, to find new ophthalmologist.  Denies depression, falls. iFOB today.  Has never had colonoscopy.  Encouraged complete iFOB.  Some anemia, longstanding.

## 2012-05-31 NOTE — Assessment & Plan Note (Signed)
Stable on current regimen, continue. 

## 2012-05-31 NOTE — Progress Notes (Signed)
Subjective:    Patient ID: Mark Holmes, male    DOB: 1935-11-30, 77 y.o.   MRN: [id number]  HPI CC: subsequent medicare wellness visit  Here for annual visit. Some trouble with right eye - due for vision screen. Takes excedrin bid pretty regularly.  Body mass index is 24.41 kg/(m^2).   Wt Readings from Last 3 Encounters:  05/31/12 175 lb (79.379 kg)  05/14/11 182 lb 8 oz (82.781 kg)  03/19/10 184 lb 12 oz (83.802 kg)   Chest ache on drive to [place].  Did take frequent breaks.  No problems in last month.  No presure/tightness.  No SOB.  No leg swelling.  No sxs with exhertion (walking, swimming).    Preventative: Prostate - followed by uro (Dr. Achilles Dunk), last seen 02/2012 with PSA 2.7, stable.  H/o BPH on avodart. Colon - has not had colonoscopy.  Wants iFOB. Pneumonia shot 2011 - completed. Tetanus shot Td 2011. Shingles shot - has had shingles.  Will think about it Sunscreen use discussed. Advanced directives: has living will at home.  HCPOA would be wife.  Denies anhedonia, depression.   No falls in last year.  Caffeine: 1 cup/day, 2 excedrin/day Married since 1959 2 daughters Occupation: retired, was Government social research officer) Activity: yardwork, stays active with grandson (swimming) Diet: avoids carbs, good water, fruits/vegetables daily  Medications and allergies reviewed and updated in chart.  Past histories reviewed and updated if relevant as below. Patient Active Problem List  Diagnoses  . HERPES ZOSTER  . THYROID NODULE  . HYPERLIPIDEMIA  . TREMOR, ESSENTIAL  . HYPERTENSION  . BENIGN PROSTATIC HYPERTROPHY  . RADICULOPATHY  . SEIZURE DISORDER  . HYPERGLYCEMIA  . ELEVATED PROSTATE SPECIFIC ANTIGEN   Past Medical History  Diagnosis Date  . Hyperlipemia   . Hypertension   . Benign prostatic hypertrophy with elevated PSA     per Dr. Achilles Dunk  . Seizure disorder   . Elevated glucose 01/2006    102  . History of CT scan of brain 1992    normal    . Elevated PSA 10/1996    2.4 ([facility])   Past Surgical History  Procedure Date  . Tonsillectomy   . Hospitalization 1981    observation for seizuare  . History of eeg 1985    normal  . Transurethral resection of prostate 10/2005    [name]   History  Substance Use Topics  . Smoking status: Never Smoker   . Smokeless tobacco: Never Used  . Alcohol Use: No   Family History  Problem Relation Age of Onset  . Febrile seizures Sister   . Cancer Sister     lung/smoker   Allergies  Allergen Reactions  . Dilaudid (Hydromorphone Hcl) Other (See Comments)    hallucinations   Current Outpatient Prescriptions on File Prior to Visit  Medication Sig Dispense Refill  . Cholecalciferol (VITAMIN D3) 1000 UNITS CAPS Take by mouth daily.        . Cyanocobalamin (B-12) 500 MCG TABS Take by mouth daily.        Marland Kitchen dutasteride (AVODART) 0.5 MG capsule Take one by mouth every three days      . fish oil-omega-3 fatty acids 1000 MG capsule Take 2 g by mouth daily. As directed       . hydrochlorothiazide (MICROZIDE) 12.5 MG capsule Take 1 capsule (12.5 mg total) by mouth every morning.  90 capsule  3  . phenytoin (DILANTIN) 100 MG ER capsule Take two by mouth twice  a day  360 capsule  3  . ramipril (ALTACE) 10 MG capsule Take 1 capsule (10 mg total) by mouth daily.  90 capsule  3  . terazosin (HYTRIN) 10 MG capsule Take 1 capsule (10 mg total) by mouth daily.  90 capsule  3     Review of Systems  Constitutional: Negative for fever, chills, activity change, appetite change, fatigue and unexpected weight change.  HENT: Negative for hearing loss and neck pain.   Eyes: Negative for visual disturbance.  Respiratory: Positive for cough (recent uri). Negative for chest tightness, shortness of breath and wheezing.   Cardiovascular: Negative for chest pain, palpitations and leg swelling.  Gastrointestinal: Negative for nausea, vomiting, abdominal pain, diarrhea, constipation, blood in stool and abdominal  distention.  Genitourinary: Negative for hematuria and difficulty urinating.  Musculoskeletal: Negative for myalgias and arthralgias.  Skin: Negative for rash.  Neurological: Negative for dizziness, seizures, syncope and headaches.  Hematological: Does not bruise/bleed easily.  Psychiatric/Behavioral: Negative for dysphoric mood. The patient is not nervous/anxious.        Objective:   Physical Exam  Nursing note and vitals reviewed. Constitutional: He is oriented to person, place, and time. He appears well-developed and well-nourished. No distress.  HENT:  Head: Normocephalic and atraumatic.  Right Ear: Hearing, tympanic membrane, external ear and ear canal normal.  Left Ear: Hearing, tympanic membrane, external ear and ear canal normal.  Nose: Nose normal.  Mouth/Throat: Oropharynx is clear and moist. No oropharyngeal exudate.  Eyes: Conjunctivae and EOM are normal. Pupils are equal, round, and reactive to light. No scleral icterus.  Neck: Normal range of motion. Neck supple. Carotid bruit is not present. No thyromegaly (L nodule) present.  Cardiovascular: Normal rate, regular rhythm, normal heart sounds and intact distal pulses.   No murmur heard. Pulses:      Radial pulses are 2+ on the right side, and 2+ on the left side.  Pulmonary/Chest: Effort normal and breath sounds normal. No respiratory distress. He has no wheezes. He has no rales.  Abdominal: Soft. Bowel sounds are normal. He exhibits no distension and no mass. There is no tenderness. There is no rebound and no guarding.  Genitourinary:       Deferred (per urology)  Musculoskeletal: Normal range of motion. He exhibits no edema.  Lymphadenopathy:    He has no cervical adenopathy.  Neurological: He is alert and oriented to person, place, and time.       CN grossly intact, station and gait intact  Skin: Skin is warm and dry. No rash noted.  Psychiatric: He has a normal mood and affect. His behavior is normal. Judgment and  thought content normal.       Assessment & Plan:  Discussed if chest ache returned or continued, to return for further eval.  If worsening to seek urgent care.

## 2012-05-31 NOTE — Assessment & Plan Note (Signed)
Free phenytoin normal range.

## 2012-12-09 ENCOUNTER — Other Ambulatory Visit: Payer: Self-pay

## 2012-12-09 MED ORDER — PHENYTOIN SODIUM EXTENDED 100 MG PO CAPS: ORAL_CAPSULE | ORAL | Status: DC

## 2012-12-09 NOTE — Telephone Encounter (Signed)
Patient notified and Rx placed up front for pick up. 

## 2012-12-09 NOTE — Telephone Encounter (Signed)
plz notify ready to pick up - placed in Kim's box. 

## 2012-12-09 NOTE — Telephone Encounter (Signed)
Pt left note requesting 90 day supply with 4 refills written rx for phenytoin. Pt would like to pick up on 12/12/12. Call when ready for pick up.

## 2012-12-28 ENCOUNTER — Ambulatory Visit (INDEPENDENT_AMBULATORY_CARE_PROVIDER_SITE_OTHER): Payer: Medicare Other | Admitting: Family Medicine

## 2012-12-28 ENCOUNTER — Encounter: Payer: Self-pay | Admitting: Family Medicine

## 2012-12-28 ENCOUNTER — Telehealth: Payer: Self-pay | Admitting: Radiology

## 2012-12-28 VITALS — BP 136/80 | HR 76 | Temp 98.1°F | Wt 178.0 lb

## 2012-12-28 DIAGNOSIS — M79609 Pain in unspecified limb: Secondary | ICD-10-CM

## 2012-12-28 DIAGNOSIS — M79602 Pain in left arm: Secondary | ICD-10-CM

## 2012-12-28 DIAGNOSIS — R0789 Other chest pain: Secondary | ICD-10-CM

## 2012-12-28 MED ORDER — NITROGLYCERIN 0.4 MG SL SUBL: 0.4000 mg | SUBLINGUAL_TABLET | SUBLINGUAL | Status: DC | PRN

## 2012-12-28 MED ORDER — ATORVASTATIN CALCIUM 10 MG PO TABS: 10.0000 mg | ORAL_TABLET | Freq: Every day | ORAL | Status: DC

## 2012-12-28 MED ORDER — CYCLOBENZAPRINE HCL 5 MG PO TABS: 5.0000 mg | ORAL_TABLET | Freq: Two times a day (BID) | ORAL | Status: DC | PRN

## 2012-12-28 NOTE — Progress Notes (Signed)
  Subjective:    Patient ID: Mark Holmes, male    DOB: 03/13/1935, 78 y.o.   MRN: [id number]  HPI CC: chest discomfort  Pleasant 77yo with h/o HTN, HLD presents with 1 yr h/o nonspecific chest discomfort described as heavy ache under L axilla and left shoulder blade, also with L arm ache.  Recently pain extending to wrist, as well as numbness of L medial hand.  Occasional ache substernally, pressure in chest.  Over last year very intermittent symptoms, over last 2 wks becoming more frequent.  Chest discomfort not exertional.  No recent exercise.  No chest pain with walking dog.  Not relieved by rest.  No SOB, diaphoresis. No neck pain or back pain.  When turns neck to right, able to reproduce pain down L arm.  No fmhx CAD. Never smoker. No recent EKG in system.  Will pull old chart.  Takes excedrin 4 pills daily (1000mg  aspirin daily).  Takes this for aches/pains.  BP Readings from Last 3 Encounters:  12/28/12 136/80  05/31/12 118/64  05/14/11 142/78    Wt Readings from Last 3 Encounters:  12/28/12 178 lb (80.74 kg)  05/31/12 175 lb (79.379 kg)  05/14/11 182 lb 8 oz (82.781 kg)   Lipid Panel     Component Value Date/Time   CHOL 173 05/23/2012 0859   TRIG 102.0 05/23/2012 0859   HDL 38.40* 05/23/2012 0859   CHOLHDL 5 05/23/2012 0859   VLDL 20.4 05/23/2012 0859   LDLCALC 114* 05/23/2012 0859    Past Medical History  Diagnosis Date  . Hyperlipemia   . Hypertension   . Benign prostatic hypertrophy with elevated PSA     per Dr. Achilles Dunk  . Seizure disorder   . Elevated glucose 01/2006    102  . History of CT scan of brain 1992    normal  . Elevated PSA 10/1996    2.4 ([facility])   Family History  Problem Relation Age of Onset  . Febrile seizures Sister   . Cancer Sister     lung/smoker  . CAD Neg Hx     Review of Systems Per HPI    Objective:   Physical Exam  Nursing note and vitals reviewed. Constitutional: He appears well-developed and well-nourished. No distress.    HENT:  Mouth/Throat: Oropharynx is clear and moist. No oropharyngeal exudate.  Eyes: Conjunctivae normal and EOM are normal. Pupils are equal, round, and reactive to light.  Neck: Normal range of motion. Neck supple.  Cardiovascular: Normal rate, regular rhythm, normal heart sounds and intact distal pulses.   No murmur heard. Pulmonary/Chest: Effort normal and breath sounds normal. No respiratory distress. He has no wheezes. He has no rales.  Musculoskeletal: He exhibits no edema.       No midline spine tenderness  Neurological: He displays no atrophy. A sensory deficit is present. He exhibits abnormal muscle tone.       diminshed strength noted of left 5th digit intrinsics - with abduction/adduction.  5/5 strength rest of BUE. Some decreased sensation endorsed along medial hand. Neg spurling.  Psychiatric: He has a normal mood and affect.       Assessment & Plan:

## 2012-12-28 NOTE — Patient Instructions (Addendum)
EKG today. Pass by Marion's office to expedite referral to heart doctor. Use nitro under tongue if return of chest discomfort while we get you to see heart doctor. Start cholesterol medication while we get you to see heart doctor. May also try muscle relaxant to see if will help left arm symptoms. Blood work today as well. If heart stable, we will further evaluate arm symptoms.

## 2012-12-28 NOTE — Telephone Encounter (Signed)
Opened in error

## 2012-12-28 NOTE — Assessment & Plan Note (Addendum)
Overall atypical chest pain however given significant risk factors in form of age and hyperlipidemia, recommended further cardiac evaluation.  H/o R ankle pain, but thinks would be able to undergo treadmill stress test. Check TnI today. Will start pt on SL nitro and statin while we expedite referral to cardiologist. If any worsening sxs, knows to seek ER evaluation.  EKG today - normal sinus at rate of ~70, normal axis, intervals, nonspecific p/t inversion V2

## 2012-12-28 NOTE — Telephone Encounter (Signed)
[facility] called STAT results -Troponin I <0.01, results given to Dr Para March

## 2012-12-28 NOTE — Telephone Encounter (Signed)
It's neg, so I'll defer to Dr. Reece Agar.

## 2012-12-28 NOTE — Assessment & Plan Note (Addendum)
If cards w/u returns without evidence of ischemia, will further eval L arm sxs - consider NCS to eval neuropathy leading to numbness/weakness of left 5th digit. Neck exam benign today. I also prescribed trial of flexeril for left arm pain - discussed sedation precautions.

## 2012-12-29 NOTE — Telephone Encounter (Signed)
Patient notified

## 2012-12-29 NOTE — Telephone Encounter (Signed)
Plz notify heart blood test returned normal. Will await cardiology eval.

## 2013-01-10 ENCOUNTER — Ambulatory Visit (INDEPENDENT_AMBULATORY_CARE_PROVIDER_SITE_OTHER): Payer: Medicare Other | Admitting: Cardiovascular Disease

## 2013-01-10 VITALS — BP 140/74 | HR 77 | Ht 71.0 in | Wt 175.0 lb

## 2013-01-10 DIAGNOSIS — E785 Hyperlipidemia, unspecified: Secondary | ICD-10-CM

## 2013-01-10 DIAGNOSIS — R079 Chest pain, unspecified: Secondary | ICD-10-CM

## 2013-01-10 DIAGNOSIS — R0789 Other chest pain: Secondary | ICD-10-CM

## 2013-01-10 DIAGNOSIS — I10 Essential (primary) hypertension: Secondary | ICD-10-CM

## 2013-01-10 NOTE — Assessment & Plan Note (Signed)
Well controlled.  Continue current medications and low sodium Dash type diet.    

## 2013-01-10 NOTE — Patient Instructions (Addendum)
Your physician recommends that you schedule a follow-up appointment in: AS NEEDED  Your physician recommends that you continue on your current medications as directed. Please refer to the Current Medication list given to you today.  Your physician has requested that you have a stress echocardiogram. For further information please visit [email or web address] Please follow instruction sheet as given. DX CHEST PAIN

## 2013-01-10 NOTE — Progress Notes (Signed)
Patient ID: Mark Holmes, male   DOB: 1935-07-30, 78 y.o.   MRN: [id number] Pleasant 77yo with h/o HTN, HLD presents with 1 yr h/o nonspecific chest discomfort described as heavy ache under L axilla and left shoulder blade, also with L arm ache. Recently pain extending to wrist, as well as numbness of L medial hand. Occasional ache substernally, pressure in chest. Over last year very intermittent symptoms, over last 2 wks becoming more frequent.  Chest discomfort not exertional. No recent exercise. No chest pain with walking dog. Not relieved by rest. No SOB, diaphoresis.  No neck pain or back pain. When turns neck to right, able to reproduce pain down L arm.   No fmhx CAD.  Never smoker.   Takes excedrin 4 pills daily (1000mg  aspirin daily). Takes this for aches/pains.  Has some left ankle pain but thinks he can walk on treadmill Had pain in the left axilla and pectoral area and ECG had no changes in office today  ROS: Denies fever, malais, weight loss, blurry vision, decreased visual acuity, cough, sputum, SOB, hemoptysis, pleuritic pain, palpitaitons, heartburn, abdominal pain, melena, lower extremity edema, claudication, or rash.  All other systems reviewed and negative   General: Affect appropriate Healthy:  appears stated age HEENT: normal Neck supple with no adenopathy JVP normal no bruits no thyromegaly Lungs clear with no wheezing and good diaphragmatic motion Heart:  S1/S2 no murmur,rub, gallop or click PMI normal Abdomen: benighn, BS positve, no tenderness, no AAA no bruit.  No HSM or HJR Distal pulses intact with no bruits No edema Neuro non-focal Skin warm and dry No muscular weakness  Medications Current Outpatient Prescriptions  Medication Sig Dispense Refill  . aspirin-acetaminophen-caffeine (EXCEDRIN MIGRAINE) 250-250-65 MG per tablet Take 1 tablet by mouth 2 (two) times daily.      Marland Kitchen atorvastatin (LIPITOR) 10 MG tablet Take 1 tablet (10 mg total) by mouth  daily.  30 tablet  3  . Cholecalciferol (VITAMIN D3) 1000 UNITS CAPS Take by mouth daily.        . Cyanocobalamin (B-12) 500 MCG TABS Take by mouth daily.        . cyclobenzaprine (FLEXERIL) 5 MG tablet Take 1 tablet (5 mg total) by mouth 2 (two) times daily as needed for muscle spasms (sedation precautions).  30 tablet  1  . dutasteride (AVODART) 0.5 MG capsule Take one by mouth every three days      . fish oil-omega-3 fatty acids 1000 MG capsule Take 2 g by mouth daily. As directed       . hydrochlorothiazide (MICROZIDE) 12.5 MG capsule Take 1 capsule (12.5 mg total) by mouth every morning.  90 capsule  3  . nitroGLYCERIN (NITROSTAT) 0.4 MG SL tablet Place 1 tablet (0.4 mg total) under the tongue every 5 (five) minutes as needed for chest pain.  50 tablet  3  . phenytoin (DILANTIN) 100 MG ER capsule Take two by mouth twice a day  360 capsule  3  . ramipril (ALTACE) 10 MG capsule Take 1 capsule (10 mg total) by mouth daily.  90 capsule  3  . terazosin (HYTRIN) 10 MG capsule Take 1 capsule (10 mg total) by mouth daily.  90 capsule  3    Allergies Dilaudid  Family History: Family History  Problem Relation Age of Onset  . Febrile seizures Sister   . Cancer Sister     lung/smoker  . CAD Neg Hx     Social History: History  Social History  . Marital Status: Married    Spouse Name: N/A    Number of Children: 2  . Years of Education: N/A   Occupational History  . 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 Smoking status: Never Smoker   . Smokeless tobacco: Never Used  . Alcohol Use: No  . Drug Use: No  . Sexually Active: No   Other Topics Concern  . 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

## 2013-01-10 NOTE — Assessment & Plan Note (Signed)
Very atypical ECG ok F/U stress echo

## 2013-01-10 NOTE — Assessment & Plan Note (Signed)
Cholesterol is at goal.  Continue current dose of statin and diet Rx.  No myalgias or side effects.  F/U  LFT's in 6 months. Lab Results  Component Value Date   LDLCALC 114* 05/23/2012

## 2013-01-18 ENCOUNTER — Other Ambulatory Visit (HOSPITAL_COMMUNITY): Payer: Medicare Other

## 2013-01-18 ENCOUNTER — Ambulatory Visit (HOSPITAL_COMMUNITY): Payer: Medicare Other | Attending: Cardiovascular Disease | Admitting: Radiology

## 2013-01-18 ENCOUNTER — Encounter: Payer: Self-pay | Admitting: Cardiovascular Disease

## 2013-01-18 ENCOUNTER — Ambulatory Visit (HOSPITAL_COMMUNITY): Payer: Medicare Other | Attending: Cardiology

## 2013-01-18 DIAGNOSIS — R079 Chest pain, unspecified: Secondary | ICD-10-CM

## 2013-01-18 DIAGNOSIS — I10 Essential (primary) hypertension: Secondary | ICD-10-CM | POA: Insufficient documentation

## 2013-01-18 DIAGNOSIS — R072 Precordial pain: Secondary | ICD-10-CM | POA: Insufficient documentation

## 2013-01-18 DIAGNOSIS — R0989 Other specified symptoms and signs involving the circulatory and respiratory systems: Secondary | ICD-10-CM

## 2013-01-18 NOTE — Progress Notes (Signed)
Stress Echocardiogram performed.  

## 2013-02-17 ENCOUNTER — Other Ambulatory Visit: Payer: Self-pay | Admitting: Family Medicine

## 2013-02-17 ENCOUNTER — Telehealth: Payer: Self-pay

## 2013-02-17 NOTE — Telephone Encounter (Signed)
Pt request status of HCTZ refill; spoke with [facility] [address] and rx is ready for pick up. Pt notified.

## 2013-03-27 ENCOUNTER — Other Ambulatory Visit: Payer: Self-pay

## 2013-03-27 MED ORDER — RAMIPRIL 10 MG PO CAPS: 10.0000 mg | ORAL_CAPSULE | Freq: Every day | ORAL | Status: DC

## 2013-03-27 MED ORDER — TERAZOSIN HCL 10 MG PO CAPS: 10.0000 mg | ORAL_CAPSULE | Freq: Every day | ORAL | Status: DC

## 2013-03-27 NOTE — Telephone Encounter (Signed)
Printed and placed in Kim's box. 

## 2013-03-27 NOTE — Telephone Encounter (Signed)
Pt left v/m requesting written rx for ramipril and Hytrin for mail order pharmacy.pt request call back when rx ready for pickup.

## 2013-03-28 NOTE — Telephone Encounter (Signed)
Patient notified and Rx's placed up front for pick up. 

## 2013-05-20 ENCOUNTER — Other Ambulatory Visit: Payer: Self-pay | Admitting: Family Medicine

## 2013-05-20 DIAGNOSIS — E785 Hyperlipidemia, unspecified: Secondary | ICD-10-CM

## 2013-05-20 DIAGNOSIS — R972 Elevated prostate specific antigen [PSA]: Secondary | ICD-10-CM

## 2013-05-20 DIAGNOSIS — R569 Unspecified convulsions: Secondary | ICD-10-CM

## 2013-05-20 DIAGNOSIS — I10 Essential (primary) hypertension: Secondary | ICD-10-CM

## 2013-05-20 DIAGNOSIS — N400 Enlarged prostate without lower urinary tract symptoms: Secondary | ICD-10-CM

## 2013-05-20 DIAGNOSIS — E041 Nontoxic single thyroid nodule: Secondary | ICD-10-CM

## 2013-05-20 DIAGNOSIS — E559 Vitamin D deficiency, unspecified: Secondary | ICD-10-CM

## 2013-05-29 ENCOUNTER — Other Ambulatory Visit: Payer: 59

## 2013-06-01 ENCOUNTER — Encounter: Payer: 59 | Admitting: Family Medicine

## 2013-06-05 ENCOUNTER — Other Ambulatory Visit (INDEPENDENT_AMBULATORY_CARE_PROVIDER_SITE_OTHER): Payer: Medicare Other

## 2013-06-05 DIAGNOSIS — E559 Vitamin D deficiency, unspecified: Secondary | ICD-10-CM

## 2013-06-05 DIAGNOSIS — R972 Elevated prostate specific antigen [PSA]: Secondary | ICD-10-CM

## 2013-06-05 DIAGNOSIS — R569 Unspecified convulsions: Secondary | ICD-10-CM

## 2013-06-05 DIAGNOSIS — E785 Hyperlipidemia, unspecified: Secondary | ICD-10-CM

## 2013-06-05 DIAGNOSIS — N400 Enlarged prostate without lower urinary tract symptoms: Secondary | ICD-10-CM

## 2013-06-05 DIAGNOSIS — E041 Nontoxic single thyroid nodule: Secondary | ICD-10-CM

## 2013-06-05 LAB — CBC WITH DIFFERENTIAL/PLATELET
Basophils Absolute: 0 10*3/uL (ref 0.0–0.1)
HCT: 39.2 % (ref 39.0–52.0)
Hemoglobin: 13.4 g/dL (ref 13.0–17.0)
Lymphs Abs: 1.6 10*3/uL (ref 0.7–4.0)
MCHC: 34.1 g/dL (ref 30.0–36.0)
MCV: 90.2 fl (ref 78.0–100.0)
Monocytes Absolute: 0.6 10*3/uL (ref 0.1–1.0)
Monocytes Relative: 6.9 % (ref 3.0–12.0)
Neutro Abs: 5.7 10*3/uL (ref 1.4–7.7)
Platelets: 232 10*3/uL (ref 150.0–400.0)
RDW: 14 % (ref 11.5–14.6)

## 2013-06-05 LAB — COMPREHENSIVE METABOLIC PANEL
ALT: 19 U/L (ref 0–53)
AST: 19 U/L (ref 0–37)
Alkaline Phosphatase: 96 U/L (ref 39–117)
CO2: 27 mEq/L (ref 19–32)
Creatinine, Ser: 1.1 mg/dL (ref 0.4–1.5)
GFR: 73.06 mL/min (ref >60.00)
Sodium: 141 mEq/L (ref 135–145)
Total Bilirubin: 0.4 mg/dL (ref 0.3–1.2)
Total Protein: 6.5 g/dL (ref 6.0–8.3)

## 2013-06-05 LAB — PSA: PSA: 2.53 ng/mL (ref 0.10–4.00)

## 2013-06-05 LAB — LIPID PANEL
Cholesterol: 170 mg/dL (ref 0–200)
LDL Cholesterol: 112 mg/dL — ABNORMAL HIGH (ref 0–99)
Total CHOL/HDL Ratio: 4
VLDL: 14 mg/dL (ref 0.0–40.0)

## 2013-06-05 LAB — TSH: TSH: 0.37 u[IU]/mL (ref 0.35–5.50)

## 2013-06-06 LAB — VITAMIN D 25 HYDROXY (VIT D DEFICIENCY, FRACTURES): Vit D, 25-Hydroxy: 45 ng/mL (ref 30–89)

## 2013-06-07 DIAGNOSIS — R339 Retention of urine, unspecified: Secondary | ICD-10-CM | POA: Insufficient documentation

## 2013-06-08 ENCOUNTER — Ambulatory Visit (INDEPENDENT_AMBULATORY_CARE_PROVIDER_SITE_OTHER): Payer: Medicare Other | Admitting: Family Medicine

## 2013-06-08 ENCOUNTER — Encounter: Payer: Self-pay | Admitting: Family Medicine

## 2013-06-08 VITALS — BP 132/68 | HR 60 | Temp 97.9°F | Ht 71.0 in | Wt 176.2 lb

## 2013-06-08 DIAGNOSIS — Z0000 Encounter for general adult medical examination without abnormal findings: Secondary | ICD-10-CM

## 2013-06-08 DIAGNOSIS — N400 Enlarged prostate without lower urinary tract symptoms: Secondary | ICD-10-CM

## 2013-06-08 DIAGNOSIS — R7989 Other specified abnormal findings of blood chemistry: Secondary | ICD-10-CM

## 2013-06-08 DIAGNOSIS — I10 Essential (primary) hypertension: Secondary | ICD-10-CM

## 2013-06-08 DIAGNOSIS — E785 Hyperlipidemia, unspecified: Secondary | ICD-10-CM

## 2013-06-08 DIAGNOSIS — E041 Nontoxic single thyroid nodule: Secondary | ICD-10-CM

## 2013-06-08 DIAGNOSIS — Z1211 Encounter for screening for malignant neoplasm of colon: Secondary | ICD-10-CM

## 2013-06-08 NOTE — Assessment & Plan Note (Signed)
To see Dr. Achilles Dunk today.

## 2013-06-08 NOTE — Assessment & Plan Note (Signed)
Chronic, stable. Continue meds. 

## 2013-06-08 NOTE — Assessment & Plan Note (Signed)
Borderline low TSH - may need rpt thyroid US in future to monitor nodule.

## 2013-06-08 NOTE — Assessment & Plan Note (Signed)
I have personally reviewed the Medicare Annual Wellness questionnaire and have noted 1. The patient's medical and social history 2. Their use of alcohol, tobacco or illicit drugs 3. Their current medications and supplements 4. The patient's functional ability including ADL's, fall risks, home safety risks and hearing or visual impairment. 5. Diet and physical activity 6. Evidence for depression or mood disorders The patients weight, height, BMI have been recorded in the chart.  Hearing and vision has been addressed. I have made referrals, counseling and provided education to the patient based review of the above and I have provided the pt with a written personalized care plan for preventive services. See scanned questionairre. Advanced directives discussed: packet provided today.  Reviewed preventative protocols and updated unless pt declined. Will check on zostavax. iFOB - encouraged to submit today. PSA/DRE at urologist's

## 2013-06-08 NOTE — Progress Notes (Signed)
Subjective:    Patient ID: Mark Holmes, male    DOB: [birth year], 78 y.o.   MRN: [id number]  HPI CC: medicare wellness  Persistent left arm paresthesias for several years, not recently worsening.  Flexeril did not significantly help. HLD - off lipitor for last several months - ran out. BPH - followed by Dr. Achilles Dunk.  Sees yearly.  On terazosin Had stress echo 12/2012 - overall normal echo, hypertensive response to exercise and below anticipated exercise tolerance capacity.  Preventative:  Prostate - followed by uro (Dr. Achilles Dunk), last seen 02/2012 with PSA 2.7, stable. H/o BPH on avodart. To see Dr. Achilles Dunk today. Colon cancer screening - has not had colonoscopy. Wants iFOB - never returned last year's stool kit.  Pneumonia shot 2011 - completed.  Tetanus shot Td 2011.  Shingles shot - has had shingles. Will think about shot Advanced directives: has living will at home. HCPOA would be wife. Requests packet today.  Sunscreen use discussed.   Passes hearing and vision screens today. Denies anhedonia, depression.  No falls in last year.   Caffeine: 1 cup/day, 2 excedrin/day  Married since 1959  2 daughters  Occupation: retired, was Government social research officer)  Activity: yardwork, walking dog, stays active with grandson (swimming)  Diet: avoids carbs, good water, fruits/vegetables daily  Medications and allergies reviewed and updated in chart.  Past histories reviewed and updated if relevant as below. Patient Active Problem List   Diagnosis Date Noted  . Vitamin D deficiency 05/20/2013  . Chest discomfort 12/28/2012  . Left arm pain 12/28/2012  . Medicare annual wellness visit, subsequent 05/31/2012  . ELEVATED PROSTATE SPECIFIC ANTIGEN 03/05/2009  . HERPES ZOSTER 01/23/2008  . THYROID NODULE 05/17/2007  . HYPERLIPIDEMIA 05/17/2007  . TREMOR, ESSENTIAL 05/17/2007  . HYPERTENSION 05/17/2007  . BENIGN PROSTATIC HYPERTROPHY 05/17/2007  . RADICULOPATHY 05/17/2007  . SEIZURE  DISORDER 05/17/2007  . HYPERGLYCEMIA 05/17/2007   Past Medical History  Diagnosis Date  . Hyperlipemia   . Hypertension   . Benign prostatic hypertrophy with elevated PSA     per Dr. Achilles Dunk  . Seizure disorder   . Elevated glucose 01/2006    102  . History of CT scan of brain 1992    normal  . Elevated PSA 10/1996    2.4 ([facility])   Past Surgical History  Procedure Laterality Date  . Tonsillectomy    . Hospitalization  1981    observation for seizuare  . History of eeg  1985    normal  . Transurethral resection of prostate  10/2005    [name]   History  Substance Use Topics  . Smoking status: Never Smoker   . Smokeless tobacco: Never Used  . Alcohol Use: No   Family History  Problem Relation Age of Onset  . Febrile seizures Sister   . Cancer Sister     lung/smoker  . CAD Neg Hx    Allergies  Allergen Reactions  . Dilaudid (Hydromorphone Hcl) Other (See Comments)    hallucinations   Current Outpatient Prescriptions on File Prior to Visit  Medication Sig Dispense Refill  . aspirin-acetaminophen-caffeine (EXCEDRIN MIGRAINE) 250-250-65 MG per tablet Take 1 tablet by mouth 2 (two) times daily.      . Cholecalciferol (VITAMIN D3) 1000 UNITS CAPS Take by mouth daily.        . Cyanocobalamin (B-12) 500 MCG TABS Take by mouth daily.        Marland Kitchen dutasteride (AVODART) 0.5 MG capsule Take one by mouth every  three days      . fish oil-omega-3 fatty acids 1000 MG capsule Take 2 g by mouth daily. As directed       . hydrochlorothiazide (MICROZIDE) 12.5 MG capsule TAKE ONE CAPSULE BY MOUTH EVERY MORNING  90 capsule  3  . phenytoin (DILANTIN) 100 MG ER capsule Take two by mouth twice a day  360 capsule  3  . ramipril (ALTACE) 10 MG capsule Take 1 capsule (10 mg total) by mouth daily.  90 capsule  3  . terazosin (HYTRIN) 10 MG capsule Take 1 capsule (10 mg total) by mouth daily.  90 capsule  3  . atorvastatin (LIPITOR) 10 MG tablet Take 1 tablet (10 mg total) by mouth daily.  30 tablet  3   . cyclobenzaprine (FLEXERIL) 5 MG tablet Take 1 tablet (5 mg total) by mouth 2 (two) times daily as needed for muscle spasms (sedation precautions).  30 tablet  1  . nitroGLYCERIN (NITROSTAT) 0.4 MG SL tablet Place 1 tablet (0.4 mg total) under the tongue every 5 (five) minutes as needed for chest pain.  50 tablet  3   No current facility-administered medications on file prior to visit.     Review of Systems  Constitutional: Negative for fever, chills, activity change, appetite change, fatigue and unexpected weight change.  HENT: Negative for hearing loss and neck pain.   Eyes: Negative for visual disturbance.  Respiratory: Negative for cough, chest tightness, shortness of breath and wheezing.   Cardiovascular: Negative for chest pain, palpitations and leg swelling.  Gastrointestinal: Negative for nausea, vomiting, abdominal pain, diarrhea, constipation, blood in stool and abdominal distention.  Genitourinary: Negative for hematuria and difficulty urinating.  Musculoskeletal: Negative for myalgias and arthralgias.  Skin: Negative for rash.  Neurological: Negative for dizziness, seizures, syncope and headaches.  Hematological: Negative for adenopathy. Does not bruise/bleed easily.  Psychiatric/Behavioral: Negative for dysphoric mood. The patient is not nervous/anxious.        Objective:   Physical Exam  Nursing note and vitals reviewed. Constitutional: He is oriented to person, place, and time. He appears well-developed and well-nourished. No distress.  HENT:  Head: Normocephalic and atraumatic.  Right Ear: External ear normal.  Left Ear: External ear normal.  Nose: Nose normal.  Mouth/Throat: Oropharynx is clear and moist. No oropharyngeal exudate.  Eyes: Conjunctivae and EOM are normal. Pupils are equal, round, and reactive to light. No scleral icterus.  Neck: Normal range of motion. Neck supple. Carotid bruit is not present.  Cardiovascular: Normal rate, regular rhythm, normal  heart sounds and intact distal pulses.   No murmur heard. Pulses:      Radial pulses are 2+ on the right side, and 2+ on the left side.  Pulmonary/Chest: Effort normal and breath sounds normal. No respiratory distress. He has no wheezes. He has no rales.  Abdominal: Soft. Bowel sounds are normal. He exhibits no distension and no mass. There is no tenderness. There is no rebound and no guarding.  Genitourinary:  Deferred as done at uro  Musculoskeletal: Normal range of motion. He exhibits no edema.  Lymphadenopathy:    He has no cervical adenopathy.  Neurological: He is alert and oriented to person, place, and time.  CN grossly intact, station and gait intact  Skin: Skin is warm and dry. No rash noted.  Psychiatric: He has a normal mood and affect. His behavior is normal. Judgment and thought content normal.       Assessment & Plan:

## 2013-06-08 NOTE — Assessment & Plan Note (Signed)
Chronic, stable.  Pt stopped lipitor.  Desires not to continue. Stress echo WNL

## 2013-06-08 NOTE — Patient Instructions (Addendum)
Stool kit today.  Call your insurance about the shingles shot to see if it is covered or how much it would cost and where is cheaper (here or pharmacy).  If you want to receive here, call for nurse visit.  Bring me copy of your living will. Advanced directive packet provided today. Good to see you today, call us with questions.

## 2013-06-08 NOTE — Assessment & Plan Note (Signed)
Stable today.

## 2013-06-10 LAB — PHENYTOIN LEVEL, FREE AND TOTAL: Phenytoin, Total: 11.7 mg/L (ref 10.0–20.0)

## 2013-06-12 ENCOUNTER — Encounter: Payer: Self-pay | Admitting: *Deleted

## 2013-10-25 ENCOUNTER — Other Ambulatory Visit: Payer: Self-pay

## 2013-10-25 NOTE — Telephone Encounter (Signed)
Printed and placed in Kim's box. 

## 2013-10-25 NOTE — Telephone Encounter (Signed)
Pt left note requesting written rx for Phenytoin 100 mg. Pt request cb when ready for pick up.

## 2013-10-26 MED ORDER — PHENYTOIN SODIUM EXTENDED 100 MG PO CAPS: ORAL_CAPSULE | ORAL | Status: DC

## 2013-10-26 NOTE — Telephone Encounter (Signed)
Patient's wife notified and Rx placed up front for pick up. 

## 2013-10-26 NOTE — Addendum Note (Signed)
Addended by: Eustaquio Boyden on: 10/26/2013 06:40 AM   Modules accepted: Orders

## 2014-02-16 ENCOUNTER — Other Ambulatory Visit: Payer: Self-pay

## 2014-02-16 MED ORDER — HYDROCHLOROTHIAZIDE 12.5 MG PO CAPS: ORAL_CAPSULE | ORAL | Status: DC

## 2014-02-16 NOTE — Telephone Encounter (Signed)
Pt request refill HCTZ to [facility] [address]. Advised pt done.

## 2014-04-12 ENCOUNTER — Ambulatory Visit (INDEPENDENT_AMBULATORY_CARE_PROVIDER_SITE_OTHER): Payer: Medicare Other | Admitting: Internal Medicine

## 2014-04-12 ENCOUNTER — Encounter: Payer: Self-pay | Admitting: Internal Medicine

## 2014-04-12 VITALS — BP 162/88 | HR 74 | Temp 99.5°F | Wt 171.0 lb

## 2014-04-12 DIAGNOSIS — J209 Acute bronchitis, unspecified: Secondary | ICD-10-CM

## 2014-04-12 MED ORDER — AZITHROMYCIN 250 MG PO TABS: ORAL_TABLET | ORAL | Status: DC

## 2014-04-12 MED ORDER — HYDROCODONE-HOMATROPINE 5-1.5 MG/5ML PO SYRP: 5.0000 mL | ORAL_SOLUTION | Freq: Three times a day (TID) | ORAL | Status: DC | PRN

## 2014-04-12 NOTE — Progress Notes (Signed)
Pre visit review using our clinic review tool, if applicable. No additional management support is needed unless otherwise documented below in the visit note. 

## 2014-04-12 NOTE — Progress Notes (Signed)
HPI  Pt presents to the clinic today with c/o cough and chest congestion. This started 5 days ago. The cough is dry, hacking and unproductive. He can not get anything up. He also c/o sore throat. He is unsure if he has had fevers but does reports chills and body aches. He has no history of allergies or asthma. He has had sick contacts. He has tried cough syrup OTC without any relief.  Review of Systems      Past Medical History  Diagnosis Date  . Hyperlipemia   . Hypertension   . Benign prostatic hypertrophy with elevated PSA     per Dr. Jacqlyn Larsen  . Seizure disorder   . Hyperglycemia 01/2006    102  . History of CT scan of brain 1992    normal  . Elevated PSA 10/1996    2.4 ([facility])    Family History  Problem Relation Age of Onset  . Febrile seizures Sister   . Cancer Sister     lung/smoker  . CAD Neg Hx     History   Social History  . Marital Status: Married    Spouse Name: N/A    Number of Children: 2  . Years of Education: N/A   Occupational History  . [employer] 2001     Information systems manager   Social History Main Topics  . Smoking status: Never Smoker   . Smokeless tobacco: Never Used  . Alcohol Use: No  . Drug Use: No  . Sexual Activity: No   Other Topics Concern  . Not on file   Social History Narrative   Caffeine: 1 cup/day, 2 excedrin/day   Married since 1959   2 daughters   Occupation: retired, was Banker)   Activity: yardwork, stays active with grandson (swimming)   Diet: avoids carbs, good water, fruits/vegetables daily    Allergies  Allergen Reactions  . Dilaudid [Hydromorphone Hcl] Other (See Comments)    hallucinations     Constitutional: Positive headache, fatigue and fever. Denies fever or abrupt weight changes.  HEENT:  Positive sore throat. Denies eye redness, eye pain, pressure behind the eyes, facial pain, nasal congestion, ear pain, ringing in the ears, wax buildup, runny nose or bloody nose. Respiratory: Positive  cough. Denies difficulty breathing or shortness of breath.  Cardiovascular: Denies chest pain, chest tightness, palpitations or swelling in the hands or feet.   No other specific complaints in a complete review of systems (except as listed in HPI above).  Objective:   BP 162/88  Pulse 74  Temp(Src) 99.5 F (37.5 C) (Oral)  Wt 171 lb (77.565 kg)  SpO2 95% Wt Readings from Last 3 Encounters:  04/12/14 171 lb (77.565 kg)  06/08/13 176 lb 4 oz (79.946 kg)  01/10/13 175 lb (79.379 kg)     General: Appears his stated age, well developed, well nourished in NAD. HEENT: Head: normal shape and size; Eyes: sclera white, no icterus, conjunctiva pink, PERRLA and EOMs intact; Ears: Tm's gray and intact, normal light reflex; Nose: mucosa pink and moist, septum midline; Throat/Mouth: + PND. Teeth present, mucosa erythematous and moist, no exudate noted, no lesions or ulcerations noted.  Neck: Mild cervical lymphadenopathy. Neck supple, trachea midline. No massses, lumps or thyromegaly present.  Cardiovascular: Normal rate and rhythm. S1,S2 noted.  No murmur, rubs or gallops noted. No JVD or BLE edema. No carotid bruits noted. Pulmonary/Chest: Normal effort and scattered rhonchi throughout. No respiratory distress. No wheezes, rales noted.  Assessment & Plan:   Acute Bronchitis:  Get some rest and drink plenty of water Do salt water gargles for the sore throat eRx for Azithromax x 5 days eRx for Hycodan cough syrup  RTC as needed or if symptoms persist.

## 2014-04-12 NOTE — Patient Instructions (Addendum)
Acute Bronchitis Bronchitis is inflammation of the airways that extend from the windpipe into the lungs (bronchi). The inflammation often causes mucus to develop. This leads to a cough, which is the most common symptom of bronchitis.  In acute bronchitis, the condition usually develops suddenly and goes away over time, usually in a couple weeks. Smoking, allergies, and asthma can make bronchitis worse. Repeated episodes of bronchitis may cause further lung problems.  CAUSES Acute bronchitis is most often caused by the same virus that causes a cold. The virus can spread from person to person (contagious).  SIGNS AND SYMPTOMS   Cough.   Fever.   Coughing up mucus.   Body aches.   Chest congestion.   Chills.   Shortness of breath.   Sore throat.  DIAGNOSIS  Acute bronchitis is usually diagnosed through a physical exam. Tests, such as chest X-rays, are sometimes done to rule out other conditions.  TREATMENT  Acute bronchitis usually goes away in a couple weeks. Often times, no medical treatment is necessary. Medicines are sometimes given for relief of fever or cough. Antibiotics are usually not needed but may be prescribed in certain situations. In some cases, an inhaler may be recommended to help reduce shortness of breath and control the cough. A cool mist vaporizer may also be used to help thin bronchial secretions and make it easier to clear the chest.  HOME CARE INSTRUCTIONS  Get plenty of rest.   Drink enough fluids to keep your urine clear or pale yellow (unless you have a medical condition that requires fluid restriction). Increasing fluids may help thin your secretions and will prevent dehydration.   Only take over-the-counter or prescription medicines as directed by your health care provider.   Avoid smoking and secondhand smoke. Exposure to cigarette smoke or irritating chemicals will make bronchitis worse. If you are a smoker, consider using nicotine gum or skin  patches to help control withdrawal symptoms. Quitting smoking will help your lungs heal faster.   Reduce the chances of another bout of acute bronchitis by washing your hands frequently, avoiding people with cold symptoms, and trying not to touch your hands to your mouth, nose, or eyes.   Follow up with your health care provider as directed.  SEEK MEDICAL CARE IF: Your symptoms do not improve after 1 week of treatment.  SEEK IMMEDIATE MEDICAL CARE IF:  You develop an increased fever or chills.   You have chest pain.   You have severe shortness of breath.  You have bloody sputum.   You develop dehydration.  You develop fainting.  You develop repeated vomiting.  You develop a severe headache. MAKE SURE YOU:   Understand these instructions.  Will watch your condition.  Will get help right away if you are not doing well or get worse. Document Released: 01/14/2005 Document Revised: 08/09/2013 Document Reviewed: 05/30/2013 [employer] Patient Information 2014 [employer].  

## 2014-04-18 ENCOUNTER — Other Ambulatory Visit: Payer: Self-pay

## 2014-04-18 MED ORDER — TERAZOSIN HCL 10 MG PO CAPS: 10.0000 mg | ORAL_CAPSULE | Freq: Every day | ORAL | Status: DC

## 2014-04-18 MED ORDER — RAMIPRIL 10 MG PO CAPS: 10.0000 mg | ORAL_CAPSULE | Freq: Every day | ORAL | Status: DC

## 2014-04-18 NOTE — Telephone Encounter (Signed)
Pt left note [facility] Advised pts wife done. Pt has CPX scheduled 06/18/14.

## 2014-06-06 ENCOUNTER — Other Ambulatory Visit: Payer: Self-pay | Admitting: Family Medicine

## 2014-06-06 DIAGNOSIS — I10 Essential (primary) hypertension: Secondary | ICD-10-CM

## 2014-06-06 DIAGNOSIS — E785 Hyperlipidemia, unspecified: Secondary | ICD-10-CM

## 2014-06-06 DIAGNOSIS — N400 Enlarged prostate without lower urinary tract symptoms: Secondary | ICD-10-CM

## 2014-06-06 DIAGNOSIS — R7989 Other specified abnormal findings of blood chemistry: Secondary | ICD-10-CM

## 2014-06-08 ENCOUNTER — Encounter: Payer: Self-pay | Admitting: *Deleted

## 2014-06-08 ENCOUNTER — Other Ambulatory Visit (INDEPENDENT_AMBULATORY_CARE_PROVIDER_SITE_OTHER): Payer: Medicare Other

## 2014-06-08 DIAGNOSIS — N400 Enlarged prostate without lower urinary tract symptoms: Secondary | ICD-10-CM

## 2014-06-08 DIAGNOSIS — E785 Hyperlipidemia, unspecified: Secondary | ICD-10-CM

## 2014-06-08 DIAGNOSIS — G252 Other specified forms of tremor: Secondary | ICD-10-CM

## 2014-06-08 DIAGNOSIS — I10 Essential (primary) hypertension: Secondary | ICD-10-CM

## 2014-06-08 DIAGNOSIS — R972 Elevated prostate specific antigen [PSA]: Secondary | ICD-10-CM

## 2014-06-08 DIAGNOSIS — G250 Essential tremor: Secondary | ICD-10-CM

## 2014-06-08 DIAGNOSIS — R7989 Other specified abnormal findings of blood chemistry: Secondary | ICD-10-CM

## 2014-06-08 LAB — BASIC METABOLIC PANEL
BUN: 28 mg/dL — ABNORMAL HIGH (ref 6–23)
CALCIUM: 9.1 mg/dL (ref 8.4–10.5)
CO2: 25 mEq/L (ref 19–32)
Chloride: 109 mEq/L (ref 96–112)
Creatinine, Ser: 1 mg/dL (ref 0.4–1.5)
GFR: 77.99 mL/min (ref >60.00)
GLUCOSE: 101 mg/dL — AB (ref 70–99)
POTASSIUM: 3.6 meq/L (ref 3.5–5.1)
SODIUM: 141 meq/L (ref 135–145)

## 2014-06-08 LAB — LIPID PANEL
CHOLESTEROL: 186 mg/dL (ref 0–200)
HDL: 54.9 mg/dL (ref >39.00)
LDL Cholesterol: 119 mg/dL — ABNORMAL HIGH (ref 0–99)
NONHDL: 131.1
Total CHOL/HDL Ratio: 3
Triglycerides: 60 mg/dL (ref 0.0–149.0)
VLDL: 12 mg/dL (ref 0.0–40.0)

## 2014-06-08 LAB — TSH: TSH: 0.49 u[IU]/mL (ref 0.35–4.50)

## 2014-06-08 LAB — PSA: PSA: 3.7 ng/mL (ref 0.10–4.00)

## 2014-06-08 LAB — T4, FREE: FREE T4: 0.57 ng/dL — AB (ref 0.60–1.60)

## 2014-06-18 ENCOUNTER — Ambulatory Visit (INDEPENDENT_AMBULATORY_CARE_PROVIDER_SITE_OTHER)
Admission: RE | Admit: 2014-06-18 | Discharge: 2014-06-18 | Disposition: A | Discharge status: Home / Self Care | Payer: Medicare Other | Source: Ambulatory Visit | Attending: Family Medicine | Admitting: Family Medicine

## 2014-06-18 ENCOUNTER — Ambulatory Visit (INDEPENDENT_AMBULATORY_CARE_PROVIDER_SITE_OTHER): Payer: Medicare Other | Admitting: Family Medicine

## 2014-06-18 ENCOUNTER — Other Ambulatory Visit (INDEPENDENT_AMBULATORY_CARE_PROVIDER_SITE_OTHER): Payer: Medicare Other

## 2014-06-18 ENCOUNTER — Encounter: Payer: Self-pay | Admitting: Family Medicine

## 2014-06-18 VITALS — BP 126/62 | HR 84 | Temp 98.4°F | Ht 71.0 in | Wt 174.8 lb

## 2014-06-18 DIAGNOSIS — G8929 Other chronic pain: Secondary | ICD-10-CM

## 2014-06-18 DIAGNOSIS — M25571 Pain in right ankle and joints of right foot: Secondary | ICD-10-CM

## 2014-06-18 DIAGNOSIS — Z0000 Encounter for general adult medical examination without abnormal findings: Secondary | ICD-10-CM | POA: Insufficient documentation

## 2014-06-18 DIAGNOSIS — M25579 Pain in unspecified ankle and joints of unspecified foot: Secondary | ICD-10-CM

## 2014-06-18 DIAGNOSIS — Z1211 Encounter for screening for malignant neoplasm of colon: Secondary | ICD-10-CM

## 2014-06-18 DIAGNOSIS — I10 Essential (primary) hypertension: Secondary | ICD-10-CM

## 2014-06-18 DIAGNOSIS — Z0001 Encounter for general adult medical examination with abnormal findings: Secondary | ICD-10-CM | POA: Insufficient documentation

## 2014-06-18 DIAGNOSIS — Z23 Encounter for immunization: Secondary | ICD-10-CM

## 2014-06-18 DIAGNOSIS — E785 Hyperlipidemia, unspecified: Secondary | ICD-10-CM

## 2014-06-18 DIAGNOSIS — R972 Elevated prostate specific antigen [PSA]: Secondary | ICD-10-CM

## 2014-06-18 DIAGNOSIS — R569 Unspecified convulsions: Secondary | ICD-10-CM

## 2014-06-18 LAB — FECAL OCCULT BLOOD, GUAIAC: Fecal Occult Blood: NEGATIVE

## 2014-06-18 LAB — FECAL OCCULT BLOOD, IMMUNOCHEMICAL: Fecal Occult Bld: NEGATIVE

## 2014-06-18 MED ORDER — HYDROCHLOROTHIAZIDE 12.5 MG PO CAPS: ORAL_CAPSULE | ORAL | Status: DC

## 2014-06-18 MED ORDER — PHENYTOIN SODIUM EXTENDED 100 MG PO CAPS: ORAL_CAPSULE | ORAL | Status: DC

## 2014-06-18 MED ORDER — TERAZOSIN HCL 10 MG PO CAPS: 10.0000 mg | ORAL_CAPSULE | Freq: Every day | ORAL | Status: DC

## 2014-06-18 MED ORDER — RAMIPRIL 10 MG PO CAPS: 10.0000 mg | ORAL_CAPSULE | Freq: Every day | ORAL | Status: DC

## 2014-06-18 NOTE — Assessment & Plan Note (Signed)
Chronic R ankle pain after suffered fall out of tree 1999. Noted chronic swelling of ankle. Check xrays for baseline - eval for old fracture. Anticipate arthritis. Placed in [place] as pt states this has helped his pain in past.

## 2014-06-18 NOTE — Progress Notes (Addendum)
BP 126/62  Pulse 84  Temp(Src) 98.4 F (36.9 C) (Oral)  Ht 5\' 11"  (1.803 m)  Wt 174 lb 12 oz (79.266 kg)  BMI 24.38 kg/m2   CC: medicare wellness visit  Subjective:    Patient ID: Mark Holmes, male    DOB: 12/25/1934, 79 y.o.   MRN: [id number]  HPI: Mark Holmes is a 79 y.o. male presenting on 06/18/2014 for Annual Exam   Sees Dr. Jacqlyn Larsen every 6 mo for BPH with elevated PSA, on avodart and terazosin.  Had stress echo 12/2012 - overall normal echo, hypertensive response to exercise and below anticipated exercise tolerance capacity.  R ankle pain - longstanding issue since at least 1999 after fall out of [place] tree, landed on his right ankle. Has treated with air cast brace in the past which helped. Requests another air cast brace. Chronic ache since then. Takes excedrin regularly for this.  Sunscreen use discussed. Passes hearing and vision screens today. Denies anhedonia, depression. No falls in last year.  Preventative: Prostate - followed by uro (Dr. Jacqlyn Larsen), last PSA 3.7, stable. H/o BPH on avodart. To see Dr. Jacqlyn Larsen 08/2014. Colon cancer screening - has not had colonoscopy. Turned in last year's ifob today. Flu shot - does not receive. Pneumovax 2011, prevnar today. Tetanus shot Td 2011.  Shingles shot - has had shingles. Will think about shot  Advanced directives: has living will at home. HCPOA would be daughter. Does not want copy in our chart - states daughter will be available.  Caffeine: 1 cup/day, 2 excedrin/day  Married since 1959  2 daughters  Occupation: retired, was Banker)  Activity: yardwork, walking dog, stays active with grandson (swimming)  Diet: avoids carbs, good water, fruits/vegetables daily   Relevant past medical, surgical, family and social history reviewed and updated as indicated.  Allergies and medications reviewed and updated. Current Outpatient Prescriptions on File Prior to Visit  Medication Sig  .  aspirin-acetaminophen-caffeine (EXCEDRIN MIGRAINE) 250-250-65 MG per tablet Take 1 tablet by mouth 2 (two) times daily as needed.   . Cholecalciferol (VITAMIN D3) 1000 UNITS CAPS Take by mouth daily.    . Cyanocobalamin (B-12) 500 MCG TABS Take by mouth daily.    Marland Kitchen dutasteride (AVODART) 0.5 MG capsule Take 0.5 mg by mouth as directed. Every 3 days  . fish oil-omega-3 fatty acids 1000 MG capsule Take 2 g by mouth daily. As directed   . Pyridoxine HCl (VITAMIN B-6 PO) Take 100 mg by mouth daily.  . nitroGLYCERIN (NITROSTAT) 0.4 MG SL tablet Place 1 tablet (0.4 mg total) under the tongue every 5 (five) minutes as needed for chest pain.   No current facility-administered medications on file prior to visit.    Review of Systems  Constitutional: Negative for fever, chills, activity change, appetite change, fatigue and unexpected weight change.  HENT: Negative for hearing loss.   Eyes: Negative for visual disturbance.  Respiratory: Negative for cough, chest tightness, shortness of breath and wheezing.   Cardiovascular: Negative for chest pain, palpitations and leg swelling.  Gastrointestinal: Negative for nausea, vomiting, abdominal pain, diarrhea, constipation, blood in stool and abdominal distention.  Genitourinary: Negative for hematuria and difficulty urinating.  Musculoskeletal: Negative for arthralgias, myalgias and neck pain.  Skin: Negative for rash.  Neurological: Negative for dizziness, seizures, syncope and headaches.  Hematological: Negative for adenopathy. Does not bruise/bleed easily.  Psychiatric/Behavioral: Negative for dysphoric mood. The patient is not nervous/anxious.    Per HPI unless specifically indicated above  Objective:    BP 126/62  Pulse 84  Temp(Src) 98.4 F (36.9 C) (Oral)  Ht 5\' 11"  (1.803 m)  Wt 174 lb 12 oz (79.266 kg)  BMI 24.38 kg/m2  Physical Exam  Nursing note and vitals reviewed. Constitutional: He is oriented to person, place, and time. He  appears well-developed and well-nourished. No distress.  HENT:  Head: Normocephalic and atraumatic.  Right Ear: Hearing, tympanic membrane, external ear and ear canal normal.  Left Ear: Hearing, tympanic membrane, external ear and ear canal normal.  Nose: Nose normal.  Mouth/Throat: Uvula is midline, oropharynx is clear and moist and mucous membranes are normal. No oropharyngeal exudate, posterior oropharyngeal edema or posterior oropharyngeal erythema.  Eyes: Conjunctivae and EOM are normal. Pupils are equal, round, and reactive to light. No scleral icterus.  Neck: Normal range of motion. Neck supple. Carotid bruit is not present. No thyromegaly present.  Cardiovascular: Normal rate, regular rhythm, normal heart sounds and intact distal pulses.   No murmur heard. Pulses:      Radial pulses are 2+ on the right side, and 2+ on the left side.  Pulmonary/Chest: Effort normal and breath sounds normal. No respiratory distress. He has no wheezes. He has no rales.  Abdominal: Soft. Bowel sounds are normal. He exhibits no distension and no mass. There is no tenderness. There is no rebound and no guarding.  Musculoskeletal: Normal range of motion. He exhibits no edema.  2+ DP L, 2+ PT L 1+ DP R, 2+ PT L Mild discomfort to palpation lateral ankle ligaments on right, but no ligament laxity or other pain at ankle landmarks. Evident chronic swelling R ankle compared to left  Lymphadenopathy:    He has no cervical adenopathy.  Neurological: He is alert and oriented to person, place, and time.  CN grossly intact, station and gait intact Recall 3/3 Calculation 5/5 serial 7s  Skin: Skin is warm and dry. No rash noted.  Psychiatric: He has a normal mood and affect. His behavior is normal. Judgment and thought content normal.   Results for orders placed in visit on 06/08/14  PSA      Result Value Ref Range   PSA 3.70  0.10 - 4.00 ng/mL  LIPID PANEL      Result Value Ref Range   Cholesterol 186  0 -  200 mg/dL   Triglycerides 60.0  0.0 - 149.0 mg/dL   HDL 54.90  >39.00 mg/dL   VLDL 12.0  0.0 - 40.0 mg/dL   LDL Cholesterol 119 (*) 0 - 99 mg/dL   Total CHOL/HDL Ratio 3     NonHDL 161.09    BASIC METABOLIC PANEL      Result Value Ref Range   Sodium 141  135 - 145 mEq/L   Potassium 3.6  3.5 - 5.1 mEq/L   Chloride 109  96 - 112 mEq/L   CO2 25  19 - 32 mEq/L   Glucose, Bld 101 (*) 70 - 99 mg/dL   BUN 28 (*) 6 - 23 mg/dL   Creatinine, Ser 1.0  0.4 - 1.5 mg/dL   Calcium 9.1  8.4 - 10.5 mg/dL   GFR 77.99  >60.00 mL/min  TSH      Result Value Ref Range   TSH 0.49  0.35 - 4.50 uIU/mL  T4, FREE      Result Value Ref Range   Free T4 0.57 (*) 0.60 - 1.60 ng/dL      Assessment & Plan:   Problem List Items  Addressed This Visit   SEIZURE DISORDER     Phenytoin refilled.    Relevant Medications      phenytoin (DILANTIN) ER capsule   Medicare annual wellness visit, subsequent - Primary     I have personally reviewed the Medicare Annual Wellness questionnaire and have noted 1. The patient's medical and social history 2. Their use of alcohol, tobacco or illicit drugs 3. Their current medications and supplements 4. The patient's functional ability including ADL's, fall risks, home safety risks and hearing or visual impairment. 5. Diet and physical activity 6. Evidence for depression or mood disorders The patients weight, height, BMI have been recorded in the chart.  Hearing and vision has been addressed. I have made referrals, counseling and provided education to the patient based review of the above and I have provided the pt with a written personalized care plan for preventive services. See scanned questionairre. Advanced directives discussed:   Reviewed preventative protocols and updated unless pt declined.    HYPERTENSION     Chronic, stable. Continue meds.    Relevant Medications      hydrochlorothiazide (MICROZIDE) 12.5 MG capsule      ramipril (ALTACE) capsule       terazosin (HYTRIN) capsule   HYPERLIPIDEMIA     Reviewed #s with patient, off med. Stress echo WNL.    Relevant Medications      hydrochlorothiazide (MICROZIDE) 12.5 MG capsule      ramipril (ALTACE) capsule      terazosin (HYTRIN) capsule   Health maintenance examination     Preventative protocols reviewed and updated unless pt declined. Discussed healthy diet and lifestyle.     ELEVATED PROSTATE SPECIFIC ANTIGEN     Continue f/u with urologist Dr. Jacqlyn Larsen.    Chronic ankle pain     Chronic R ankle pain after suffered fall out of tree 1999. Noted chronic swelling of ankle. Check xrays for baseline - eval for old fracture. Anticipate arthritis. Placed in [place] as pt states this has helped his pain in past.    Relevant Orders      DG Ankle Complete Right       Follow up plan: Return in about 1 year (around 06/19/2015), or as needed, for annual exam, prior fasting for blood work.

## 2014-06-18 NOTE — Assessment & Plan Note (Signed)
Preventative protocols reviewed and updated unless pt declined. Discussed healthy diet and lifestyle.  

## 2014-06-18 NOTE — Assessment & Plan Note (Signed)
Chronic, stable. Continue meds. 

## 2014-06-18 NOTE — Addendum Note (Signed)
Addended by: Royann Shivers A on: 06/18/2014 09:27 AM   Modules accepted: Orders

## 2014-06-18 NOTE — Assessment & Plan Note (Signed)
Continue f/u with urologist Dr. Jacqlyn Larsen.

## 2014-06-18 NOTE — Assessment & Plan Note (Signed)
I have personally reviewed the Medicare Annual Wellness questionnaire and have noted 1. The patient's medical and social history 2. Their use of alcohol, tobacco or illicit drugs 3. Their current medications and supplements 4. The patient's functional ability including ADL's, fall risks, home safety risks and hearing or visual impairment. 5. Diet and physical activity 6. Evidence for depression or mood disorders The patients weight, height, BMI have been recorded in the chart.  Hearing and vision has been addressed. I have made referrals, counseling and provided education to the patient based review of the above and I have provided the pt with a written personalized care plan for preventive services. See scanned questionairre. Advanced directives discussed:   Reviewed preventative protocols and updated unless pt declined.

## 2014-06-18 NOTE — Assessment & Plan Note (Signed)
Reviewed #s with patient, off med. Stress echo WNL.

## 2014-06-18 NOTE — Addendum Note (Signed)
Addended by: Royann Shivers A on: 06/18/2014 03:03 PM   Modules accepted: Orders

## 2014-06-18 NOTE — Patient Instructions (Addendum)
Call your insurance about the shingles shot to see if it is covered or how much it would cost and where is cheaper (here or pharmacy).  If you want to receive here, call for nurse visit. Prevnar today (second pneumonia shot). Xray today. We will place you in aircast brace for right ankle. Good to see you today, call us with questions.

## 2014-06-18 NOTE — Assessment & Plan Note (Signed)
Phenytoin refilled.

## 2014-06-19 ENCOUNTER — Encounter: Payer: Self-pay | Admitting: *Deleted

## 2014-06-19 ENCOUNTER — Telehealth: Payer: Self-pay | Admitting: Family Medicine

## 2014-06-19 NOTE — Telephone Encounter (Signed)
Relevant patient education assigned to patient using Emmi. ° °

## 2015-06-13 ENCOUNTER — Other Ambulatory Visit: Payer: Self-pay | Admitting: Family Medicine

## 2015-06-13 DIAGNOSIS — I10 Essential (primary) hypertension: Secondary | ICD-10-CM

## 2015-06-13 DIAGNOSIS — N400 Enlarged prostate without lower urinary tract symptoms: Secondary | ICD-10-CM

## 2015-06-13 DIAGNOSIS — E041 Nontoxic single thyroid nodule: Secondary | ICD-10-CM

## 2015-06-13 DIAGNOSIS — E785 Hyperlipidemia, unspecified: Secondary | ICD-10-CM

## 2015-06-14 ENCOUNTER — Other Ambulatory Visit (INDEPENDENT_AMBULATORY_CARE_PROVIDER_SITE_OTHER): Payer: Medicare Other

## 2015-06-14 DIAGNOSIS — I10 Essential (primary) hypertension: Secondary | ICD-10-CM | POA: Diagnosis not present

## 2015-06-14 DIAGNOSIS — N400 Enlarged prostate without lower urinary tract symptoms: Secondary | ICD-10-CM

## 2015-06-14 DIAGNOSIS — E041 Nontoxic single thyroid nodule: Secondary | ICD-10-CM | POA: Diagnosis not present

## 2015-06-14 DIAGNOSIS — E785 Hyperlipidemia, unspecified: Secondary | ICD-10-CM

## 2015-06-14 LAB — LIPID PANEL
CHOL/HDL RATIO: 4
CHOLESTEROL: 180 mg/dL (ref 0–200)
HDL: 44.4 mg/dL (ref >39.00)
LDL CALC: 121 mg/dL — AB (ref 0–99)
NonHDL: 135.6
TRIGLYCERIDES: 72 mg/dL (ref 0.0–149.0)
VLDL: 14.4 mg/dL (ref 0.0–40.0)

## 2015-06-14 LAB — T4, FREE: Free T4: 0.69 ng/dL (ref 0.60–1.60)

## 2015-06-14 LAB — BASIC METABOLIC PANEL
BUN: 27 mg/dL — ABNORMAL HIGH (ref 6–23)
CO2: 29 meq/L (ref 19–32)
CREATININE: 1.18 mg/dL (ref 0.40–1.50)
Calcium: 9.1 mg/dL (ref 8.4–10.5)
Chloride: 105 mEq/L (ref 96–112)
GFR: 63.51 mL/min (ref >60.00)
GLUCOSE: 97 mg/dL (ref 70–99)
Potassium: 4.1 mEq/L (ref 3.5–5.1)
Sodium: 140 mEq/L (ref 135–145)

## 2015-06-14 LAB — PSA: PSA: 3.04 ng/mL (ref 0.10–4.00)

## 2015-06-14 LAB — TSH: TSH: 0.58 u[IU]/mL (ref 0.35–4.50)

## 2015-06-21 ENCOUNTER — Encounter: Payer: Self-pay | Admitting: Family Medicine

## 2015-06-21 ENCOUNTER — Ambulatory Visit (INDEPENDENT_AMBULATORY_CARE_PROVIDER_SITE_OTHER): Payer: Medicare Other | Admitting: Family Medicine

## 2015-06-21 VITALS — BP 118/62 | HR 66 | Temp 98.0°F | Ht 68.0 in | Wt 177.8 lb

## 2015-06-21 DIAGNOSIS — Z7189 Other specified counseling: Secondary | ICD-10-CM | POA: Insufficient documentation

## 2015-06-21 DIAGNOSIS — Z0000 Encounter for general adult medical examination without abnormal findings: Secondary | ICD-10-CM

## 2015-06-21 DIAGNOSIS — Z1211 Encounter for screening for malignant neoplasm of colon: Secondary | ICD-10-CM

## 2015-06-21 DIAGNOSIS — G40909 Epilepsy, unspecified, not intractable, without status epilepticus: Secondary | ICD-10-CM

## 2015-06-21 DIAGNOSIS — I10 Essential (primary) hypertension: Secondary | ICD-10-CM

## 2015-06-21 DIAGNOSIS — N400 Enlarged prostate without lower urinary tract symptoms: Secondary | ICD-10-CM

## 2015-06-21 DIAGNOSIS — E785 Hyperlipidemia, unspecified: Secondary | ICD-10-CM

## 2015-06-21 DIAGNOSIS — E041 Nontoxic single thyroid nodule: Secondary | ICD-10-CM

## 2015-06-21 MED ORDER — TERAZOSIN HCL 10 MG PO CAPS: 10.0000 mg | ORAL_CAPSULE | Freq: Every day | ORAL | Status: DC

## 2015-06-21 MED ORDER — RAMIPRIL 10 MG PO CAPS: 10.0000 mg | ORAL_CAPSULE | Freq: Every day | ORAL | Status: DC

## 2015-06-21 MED ORDER — PHENYTOIN SODIUM EXTENDED 100 MG PO CAPS: ORAL_CAPSULE | ORAL | Status: DC

## 2015-06-21 MED ORDER — HYDROCHLOROTHIAZIDE 12.5 MG PO CAPS: ORAL_CAPSULE | ORAL | Status: DC

## 2015-06-21 NOTE — Progress Notes (Signed)
Pre visit review using our clinic review tool, if applicable. No additional management support is needed unless otherwise documented below in the visit note. 

## 2015-06-21 NOTE — Progress Notes (Signed)
BP 118/62 mmHg  Pulse 66  Temp(Src) 98 F (36.7 C) (Oral)  Ht 5\' 8"  (1.727 m)  Wt 177 lb 12.8 oz (80.65 kg)  BMI 27.04 kg/m2  SpO2 98%   CC: medicare wellness visit  Subjective:    Patient ID: Mark Holmes, male    DOB: 10/15/1935, 80 y.o.   MRN: [id number]  HPI: LIN GLAZIER is a 80 y.o. male presenting on 06/21/2015 for Annual Exam   H/o seizure disorder - on phenytoin daily.  Sees Dr Jacqlyn Larsen Q1 yr regularly for BPH/h/o elevated PSA. On terazosin and dutasteride.  Passes hearing and vision screens today. Denies anhedonia, depression. No falls in last year.  Preventative: Prostate - followed by uro (Dr. Jacqlyn Larsen), last PSA 3.0, stable. H/o BPH on avodart. To see Dr. Jacqlyn Larsen 08/2014. Colon cancer screening - has not had colonoscopy. Requests iFOB. Flu shot - does not receive. Pneumovax 2011, prevnar 2015. Tetanus shot Td 2011.  Shingles shot - has had shingles. Declines.  Advanced directives: has living will at home. HCPOA would be daughter. Does not want copy in our chart - states daughter will be available. Would be ok with CPR and temporary trial of life support if reversible condition. Does not want prolonged life support. Seat belt use discussed. Sunscreen use discussed. No changing moles.  Caffeine: 1 cup/day, 2 excedrin/day  Married since 1959  2 daughters  Occupation: retired, was Banker)  Activity: yardwork, walking dog, stays active with grandson (swimming) Diet: avoids carbs, good water, fruits/vegetables daily  Relevant past medical, surgical, family and social history reviewed and updated as indicated. Interim medical history since our last visit reviewed. Allergies and medications reviewed and updated. Current Outpatient Prescriptions on File Prior to Visit  Medication Sig  . aspirin-acetaminophen-caffeine (EXCEDRIN MIGRAINE) 250-250-65 MG per tablet Take 1 tablet by mouth 2 (two) times daily as needed.   . Cholecalciferol  (VITAMIN D3) 1000 UNITS CAPS Take by mouth daily.    . Cyanocobalamin (B-12) 500 MCG TABS Take by mouth daily.    Marland Kitchen dutasteride (AVODART) 0.5 MG capsule Take 0.5 mg by mouth as directed. Every 3 days  . fish oil-omega-3 fatty acids 1000 MG capsule Take 2 g by mouth daily. As directed   . nitroGLYCERIN (NITROSTAT) 0.4 MG SL tablet Place 1 tablet (0.4 mg total) under the tongue every 5 (five) minutes as needed for chest pain.  Marland Kitchen Pyridoxine HCl (VITAMIN B-6 PO) Take 100 mg by mouth daily.   No current facility-administered medications on file prior to visit.    Review of Systems  Constitutional: Negative for fever, chills, activity change, appetite change, fatigue and unexpected weight change.  HENT: Negative for hearing loss.   Eyes: Negative for visual disturbance.  Respiratory: Negative for cough, chest tightness, shortness of breath and wheezing.   Cardiovascular: Negative for chest pain, palpitations and leg swelling.  Gastrointestinal: Negative for nausea, vomiting, abdominal pain, diarrhea, constipation, blood in stool and abdominal distention.  Genitourinary: Negative for hematuria and difficulty urinating.  Musculoskeletal: Negative for myalgias, arthralgias and neck pain.       R sciatica  Skin: Negative for rash.  Neurological: Negative for dizziness, seizures, syncope and headaches.  Hematological: Negative for adenopathy. Does not bruise/bleed easily.  Psychiatric/Behavioral: Negative for dysphoric mood. The patient is not nervous/anxious.    Per HPI unless specifically indicated above     Objective:    BP 118/62 mmHg  Pulse 66  Temp(Src) 98 F (36.7 C) (Oral)  Ht 5\' 8"  (1.727 m)  Wt 177 lb 12.8 oz (80.65 kg)  BMI 27.04 kg/m2  SpO2 98%  Wt Readings from Last 3 Encounters:  06/21/15 177 lb 12.8 oz (80.65 kg)  06/18/14 174 lb 12 oz (79.266 kg)  04/12/14 171 lb (77.565 kg)    Physical Exam  Constitutional: He is oriented to person, place, and time. He appears  well-developed and well-nourished. No distress.  HENT:  Head: Normocephalic and atraumatic.  Right Ear: Hearing, tympanic membrane, external ear and ear canal normal.  Left Ear: Hearing, tympanic membrane, external ear and ear canal normal.  Nose: Nose normal.  Mouth/Throat: Uvula is midline, oropharynx is clear and moist and mucous membranes are normal. No oropharyngeal exudate, posterior oropharyngeal edema or posterior oropharyngeal erythema.  Eyes: Conjunctivae and EOM are normal. Pupils are equal, round, and reactive to light. No scleral icterus.  Neck: Normal range of motion. Neck supple. Carotid bruit is not present. No thyromegaly present.  Cardiovascular: Normal rate, regular rhythm, normal heart sounds and intact distal pulses.   No murmur heard. Pulses:      Radial pulses are 2+ on the right side, and 2+ on the left side.  Pulmonary/Chest: Effort normal and breath sounds normal. No respiratory distress. He has no wheezes. He has no rales.  Abdominal: Soft. Bowel sounds are normal. He exhibits no distension and no mass. There is no tenderness. There is no rebound and no guarding.  Musculoskeletal: Normal range of motion. He exhibits no edema.  Lymphadenopathy:    He has no cervical adenopathy.  Neurological: He is alert and oriented to person, place, and time.  CN grossly intact, station and gait intact Recall 3/3 calculation 4/5 serial 7s  Skin: Skin is warm and dry. No rash noted.  Psychiatric: He has a normal mood and affect. His behavior is normal. Judgment and thought content normal.  Nursing note and vitals reviewed.  Results for orders placed or performed in visit on 06/14/15  T4, free  Result Value Ref Range   Free T4 0.69 0.60 - 1.60 ng/dL  Lipid panel  Result Value Ref Range   Cholesterol 180 0 - 200 mg/dL   Triglycerides 72.0 0.0 - 149.0 mg/dL   HDL 44.40 >39.00 mg/dL   VLDL 14.4 0.0 - 40.0 mg/dL   LDL Cholesterol 121 (H) 0 - 99 mg/dL   Total CHOL/HDL Ratio 4     NonHDL 135.60   TSH  Result Value Ref Range   TSH 0.58 0.35 - 4.50 uIU/mL  Basic metabolic panel  Result Value Ref Range   Sodium 140 135 - 145 mEq/L   Potassium 4.1 3.5 - 5.1 mEq/L   Chloride 105 96 - 112 mEq/L   CO2 29 19 - 32 mEq/L   Glucose, Bld 97 70 - 99 mg/dL   BUN 27 (H) 6 - 23 mg/dL   Creatinine, Ser 1.18 0.40 - 1.50 mg/dL   Calcium 9.1 8.4 - 10.5 mg/dL   GFR 63.51 >60.00 mL/min  PSA  Result Value Ref Range   PSA 3.04 0.10 - 4.00 ng/mL      Assessment & Plan:   Problem List Items Addressed This Visit    Advanced care planning/counseling discussion    Advanced directives: has living will at home. HCPOA would be daughter. Does not want copy in our chart - states daughter will be available. Would be ok with CPR and temporary trial of life support if reversible condition. Does not want prolonged life support.  BPH (benign prostatic hypertrophy)    PSA stable. Followed by Dr Jacqlyn Larsen.      Essential hypertension    Chronic, stable. Continue current regimen.      Relevant Medications   terazosin (HYTRIN) 10 MG capsule   ramipril (ALTACE) 10 MG capsule   hydrochlorothiazide (MICROZIDE) 12.5 MG capsule   Health maintenance examination    Preventative protocols reviewed and updated unless pt declined. Discussed healthy diet and lifestyle.       HLD (hyperlipidemia)    Chronic, stable off meds. Prior stress echo WNL.      Relevant Medications   terazosin (HYTRIN) 10 MG capsule   ramipril (ALTACE) 10 MG capsule   hydrochlorothiazide (MICROZIDE) 12.5 MG capsule   Medicare annual wellness visit, subsequent - Primary    I have personally reviewed the Medicare Annual Wellness questionnaire and have noted 1. The patient's medical and social history 2. Their use of alcohol, tobacco or illicit drugs 3. Their current medications and supplements 4. The patient's functional ability including ADL's, fall risks, home safety risks and hearing or visual impairment.  Cognitive function has been assessed and addressed as indicated.  5. Diet and physical activity 6. Evidence for depression or mood disorders The patients weight, height, BMI have been recorded in the chart. I have made referrals, counseling and provided education to the patient based on review of the above and I have provided the pt with a written personalized care plan for preventive services. Provider list updated.. See scanned questionairre as needed for further documentation. Reviewed preventative protocols and updated unless pt declined.       Seizure disorder    Stable on long term phenytoin. Continue. Check level next blood work.      THYROID NODULE    Not appreciated on exam today.       Other Visit Diagnoses    Special screening for malignant neoplasms, colon        Relevant Orders    Fecal occult blood, imunochemical        Follow up plan: Return in about 1 year (around 06/20/2016), or as needed, for medicare wellness.

## 2015-06-21 NOTE — Assessment & Plan Note (Signed)
Stable on long term phenytoin. Continue. Check level next blood work.

## 2015-06-21 NOTE — Assessment & Plan Note (Signed)
Chronic, stable. Continue current regimen. 

## 2015-06-21 NOTE — Assessment & Plan Note (Signed)
Not appreciated on exam today

## 2015-06-21 NOTE — Assessment & Plan Note (Addendum)
PSA stable. Followed by Dr Jacqlyn Larsen.

## 2015-06-21 NOTE — Assessment & Plan Note (Signed)
Chronic, stable off meds. Prior stress echo WNL.

## 2015-06-21 NOTE — Assessment & Plan Note (Signed)
Preventative protocols reviewed and updated unless pt declined. Discussed healthy diet and lifestyle.  

## 2015-06-21 NOTE — Assessment & Plan Note (Signed)

## 2015-06-21 NOTE — Assessment & Plan Note (Addendum)
Advanced directives: has living will at home. HCPOA would be daughter. Does not want copy in our chart - states daughter will be available. Would be ok with CPR and temporary trial of life support if reversible condition. Does not want prolonged life support.

## 2015-06-21 NOTE — Patient Instructions (Addendum)
Pass by lab to pick up stool kit. Return as needed or in 1 year for next medicare wellness visit. Good to see you today. You are doing well.

## 2015-08-06 ENCOUNTER — Encounter: Payer: Self-pay | Admitting: Family Medicine

## 2015-08-19 ENCOUNTER — Other Ambulatory Visit: Payer: Self-pay | Admitting: Family Medicine

## 2015-12-13 ENCOUNTER — Ambulatory Visit (INDEPENDENT_AMBULATORY_CARE_PROVIDER_SITE_OTHER): Payer: Medicare Other | Admitting: Family Medicine

## 2015-12-13 ENCOUNTER — Encounter: Payer: Self-pay | Admitting: Family Medicine

## 2015-12-13 VITALS — BP 160/82 | HR 76 | Temp 98.0°F | Wt 179.0 lb

## 2015-12-13 DIAGNOSIS — B9789 Other viral agents as the cause of diseases classified elsewhere: Principal | ICD-10-CM

## 2015-12-13 DIAGNOSIS — I10 Essential (primary) hypertension: Secondary | ICD-10-CM

## 2015-12-13 DIAGNOSIS — J069 Acute upper respiratory infection, unspecified: Secondary | ICD-10-CM | POA: Diagnosis not present

## 2015-12-13 MED ORDER — GUAIFENESIN-CODEINE 100-10 MG/5ML PO SYRP: 5.0000 mL | ORAL_SOLUTION | Freq: Every evening | ORAL | Status: DC | PRN

## 2015-12-13 NOTE — Patient Instructions (Addendum)
I think you have upper respiratory infection, possibly early bronchitis but likely just viral. Should improve with time. Cough may be the last to go away and may last a few weeks. Antibiotics are not needed for this.  Viral infections usually take 7-10 days to resolve.  Use medication as prescribed: codeine cough syrup.  Push fluids and plenty of rest. Plain mucinex with plenty of water to help mobilize mucous.  If fever >101, worsening productive cough, or symptoms worsen after initial improvement, let me know. Or if not improving into next week, let me know.  Call clinic with questions.  Good to see you today. I hope you start feeling better soon.

## 2015-12-13 NOTE — Assessment & Plan Note (Signed)
Anticipate viral uri given short duration.  Cheratussin for cough as needed.  Push fluids and rest. No need for abx at this time. Update if not improving with treatment. Pt agrees with plan.

## 2015-12-13 NOTE — Progress Notes (Signed)
BP 160/82 mmHg  Pulse 76  Temp(Src) 98 F (36.7 C) (Oral)  Wt 179 lb (81.194 kg)  SpO2 98%   CC: cough  Subjective:    Patient ID: TAITEN BELISLE, male    DOB: 1935-07-18, 80 y.o.   MRN: [id number]  HPI: DUWAINE JASMIN is a 80 y.o. male presenting on 12/13/2015 for Cough   4d h/o productive cough worse at night time with fits that impede ability to sleep, wake him up at night. + initial ST and head>chest congestion.   No fevers/chills, ear or tooth pain, headache, PNdrainage. No dyspnea.  So far has tried OTC cough syrup, nyquil and dayquil, cough drops.  + grandchildren sick at home. No smokers at home. No h/o asthma.   Relevant past medical, surgical, family and social history reviewed and updated as indicated. Interim medical history since our last visit reviewed. Allergies and medications reviewed and updated. Current Outpatient Prescriptions on File Prior to Visit  Medication Sig  . aspirin-acetaminophen-caffeine (EXCEDRIN MIGRAINE) 250-250-65 MG per tablet Take 1 tablet by mouth 2 (two) times daily as needed.   . Cholecalciferol (VITAMIN D3) 1000 UNITS CAPS Take by mouth daily.    . Cyanocobalamin (B-12) 500 MCG TABS Take by mouth daily.    Marland Kitchen dutasteride (AVODART) 0.5 MG capsule Take 0.5 mg by mouth as directed. Every 3 days  . ferrous fumarate (HEMOCYTE - 106 MG FE) 325 (106 FE) MG TABS tablet Take 1 tablet by mouth daily.  . fish oil-omega-3 fatty acids 1000 MG capsule Take 2 g by mouth daily. As directed   . hydrochlorothiazide (MICROZIDE) 12.5 MG capsule TAKE ONE CAPSULE BY MOUTH EVERY MORNING  . nitroGLYCERIN (NITROSTAT) 0.4 MG SL tablet Place 1 tablet (0.4 mg total) under the tongue every 5 (five) minutes as needed for chest pain.  . phenytoin (DILANTIN) 100 MG ER capsule TAKE 2 CAPSULES TWICE A DAY  . Pyridoxine HCl (VITAMIN B-6 PO) Take 100 mg by mouth daily.  . ramipril (ALTACE) 10 MG capsule Take 1 capsule (10 mg total) by mouth daily.  Marland Kitchen  terazosin (HYTRIN) 10 MG capsule Take 1 capsule (10 mg total) by mouth daily.   No current facility-administered medications on file prior to visit.    Review of Systems Per HPI unless specifically indicated in ROS section     Objective:    BP 160/82 mmHg  Pulse 76  Temp(Src) 98 F (36.7 C) (Oral)  Wt 179 lb (81.194 kg)  SpO2 98%  Wt Readings from Last 3 Encounters:  12/13/15 179 lb (81.194 kg)  06/21/15 177 lb 12.8 oz (80.65 kg)  06/18/14 174 lb 12 oz (79.266 kg)    Physical Exam  Constitutional: He appears well-developed and well-nourished. No distress.  HENT:  Head: Normocephalic and atraumatic.  Right Ear: Hearing, tympanic membrane, external ear and ear canal normal.  Left Ear: Hearing, tympanic membrane, external ear and ear canal normal.  Nose: Mucosal edema (L>R) present. No rhinorrhea. Right sinus exhibits no maxillary sinus tenderness and no frontal sinus tenderness. Left sinus exhibits no maxillary sinus tenderness and no frontal sinus tenderness.  Mouth/Throat: Uvula is midline and mucous membranes are normal. Posterior oropharyngeal erythema present. No oropharyngeal exudate, posterior oropharyngeal edema or tonsillar abscesses.  Eyes: Conjunctivae and EOM are normal. Pupils are equal, round, and reactive to light. No scleral icterus.  Neck: Normal range of motion. Neck supple. No thyromegaly present.  Cardiovascular: Normal rate, regular rhythm, normal heart sounds and intact distal pulses.  No murmur heard. Pulmonary/Chest: Effort normal and breath sounds normal. No respiratory distress. He has no wheezes. He has no rales.  Lymphadenopathy:    He has no cervical adenopathy.  Skin: Skin is warm and dry. No rash noted.  Nursing note and vitals reviewed.     Assessment & Plan:   Problem List Items Addressed This Visit    Viral URI with cough - Primary    Anticipate viral uri given short duration.  Cheratussin for cough as needed.  Push fluids and rest. No  need for abx at this time. Update if not improving with treatment. Pt agrees with plan.      Essential hypertension    Bp elevation noted today - but prior office visits better (when feeling better). Discussed avoiding decongestants (dayquil has some).           Follow up plan: Return if symptoms worsen or fail to improve.

## 2015-12-13 NOTE — Assessment & Plan Note (Signed)
Bp elevation noted today - but prior office visits better (when feeling better). Discussed avoiding decongestants (dayquil has some).

## 2016-04-26 ENCOUNTER — Other Ambulatory Visit: Payer: Self-pay | Admitting: Family Medicine

## 2016-04-27 NOTE — Telephone Encounter (Signed)
Pt called to ck status of dilantin refill; pt advised [facility] ; pt voiced understanding.

## 2016-06-14 ENCOUNTER — Other Ambulatory Visit: Payer: Self-pay | Admitting: Family Medicine

## 2016-06-14 DIAGNOSIS — G40909 Epilepsy, unspecified, not intractable, without status epilepticus: Secondary | ICD-10-CM

## 2016-06-14 DIAGNOSIS — E559 Vitamin D deficiency, unspecified: Secondary | ICD-10-CM

## 2016-06-14 DIAGNOSIS — E041 Nontoxic single thyroid nodule: Secondary | ICD-10-CM

## 2016-06-14 DIAGNOSIS — E785 Hyperlipidemia, unspecified: Secondary | ICD-10-CM

## 2016-06-14 DIAGNOSIS — I10 Essential (primary) hypertension: Secondary | ICD-10-CM

## 2016-06-15 ENCOUNTER — Other Ambulatory Visit (INDEPENDENT_AMBULATORY_CARE_PROVIDER_SITE_OTHER): Payer: Medicare Other

## 2016-06-15 DIAGNOSIS — E785 Hyperlipidemia, unspecified: Secondary | ICD-10-CM

## 2016-06-15 DIAGNOSIS — E559 Vitamin D deficiency, unspecified: Secondary | ICD-10-CM | POA: Diagnosis not present

## 2016-06-15 DIAGNOSIS — G40909 Epilepsy, unspecified, not intractable, without status epilepticus: Secondary | ICD-10-CM | POA: Diagnosis not present

## 2016-06-15 DIAGNOSIS — E041 Nontoxic single thyroid nodule: Secondary | ICD-10-CM | POA: Diagnosis not present

## 2016-06-15 LAB — TSH: TSH: 0.47 u[IU]/mL (ref 0.35–4.50)

## 2016-06-15 LAB — LIPID PANEL
CHOLESTEROL: 159 mg/dL (ref 0–200)
HDL: 39.8 mg/dL (ref >39.00)
LDL CALC: 97 mg/dL (ref 0–99)
NonHDL: 119.25
Total CHOL/HDL Ratio: 4
Triglycerides: 110 mg/dL (ref 0.0–149.0)
VLDL: 22 mg/dL (ref 0.0–40.0)

## 2016-06-15 LAB — CBC WITH DIFFERENTIAL/PLATELET
BASOS ABS: 0 10*3/uL (ref 0.0–0.1)
Basophils Relative: 0.4 % (ref 0.0–3.0)
EOS ABS: 0.2 10*3/uL (ref 0.0–0.7)
Eosinophils Relative: 2.9 % (ref 0.0–5.0)
HCT: 38.3 % — ABNORMAL LOW (ref 39.0–52.0)
HEMOGLOBIN: 12.9 g/dL — AB (ref 13.0–17.0)
LYMPHS PCT: 19 % (ref 12.0–46.0)
Lymphs Abs: 1.3 10*3/uL (ref 0.7–4.0)
MCHC: 33.6 g/dL (ref 30.0–36.0)
MCV: 87.4 fl (ref 78.0–100.0)
MONO ABS: 0.5 10*3/uL (ref 0.1–1.0)
Monocytes Relative: 6.9 % (ref 3.0–12.0)
NEUTROS ABS: 4.9 10*3/uL (ref 1.4–7.7)
Neutrophils Relative %: 70.8 % (ref 43.0–77.0)
PLATELETS: 253 10*3/uL (ref 150.0–400.0)
RBC: 4.39 Mil/uL (ref 4.22–5.81)
RDW: 14 % (ref 11.5–15.5)
WBC: 7 10*3/uL (ref 4.0–10.5)

## 2016-06-15 LAB — VITAMIN D 25 HYDROXY (VIT D DEFICIENCY, FRACTURES): VITD: 41.78 ng/mL (ref 30.00–100.00)

## 2016-06-15 LAB — COMPREHENSIVE METABOLIC PANEL
ALBUMIN: 3.8 g/dL (ref 3.5–5.2)
ALK PHOS: 75 U/L (ref 39–117)
ALT: 13 U/L (ref 0–53)
AST: 13 U/L (ref 0–37)
BUN: 22 mg/dL (ref 6–23)
CALCIUM: 9.2 mg/dL (ref 8.4–10.5)
CHLORIDE: 106 meq/L (ref 96–112)
CO2: 28 mEq/L (ref 19–32)
Creatinine, Ser: 1.13 mg/dL (ref 0.40–1.50)
GFR: 66.6 mL/min (ref >60.00)
Glucose, Bld: 107 mg/dL — ABNORMAL HIGH (ref 70–99)
POTASSIUM: 3.7 meq/L (ref 3.5–5.1)
SODIUM: 141 meq/L (ref 135–145)
TOTAL PROTEIN: 6.3 g/dL (ref 6.0–8.3)
Total Bilirubin: 0.3 mg/dL (ref 0.2–1.2)

## 2016-06-17 LAB — PHENYTOIN LEVEL, FREE: Phenytoin, Free: 1.3 ug/mL (ref 1.0–2.0)

## 2016-06-22 ENCOUNTER — Encounter: Payer: Medicare Other | Admitting: Family Medicine

## 2016-06-29 ENCOUNTER — Encounter: Payer: Self-pay | Admitting: Family Medicine

## 2016-06-29 ENCOUNTER — Ambulatory Visit (INDEPENDENT_AMBULATORY_CARE_PROVIDER_SITE_OTHER): Payer: Medicare Other | Admitting: Family Medicine

## 2016-06-29 VITALS — BP 140/70 | HR 64 | Temp 98.2°F | Ht 67.75 in | Wt 176.2 lb

## 2016-06-29 DIAGNOSIS — M25571 Pain in right ankle and joints of right foot: Secondary | ICD-10-CM | POA: Diagnosis not present

## 2016-06-29 DIAGNOSIS — R972 Elevated prostate specific antigen [PSA]: Secondary | ICD-10-CM

## 2016-06-29 DIAGNOSIS — G8929 Other chronic pain: Secondary | ICD-10-CM | POA: Diagnosis not present

## 2016-06-29 DIAGNOSIS — Z7189 Other specified counseling: Secondary | ICD-10-CM

## 2016-06-29 DIAGNOSIS — E559 Vitamin D deficiency, unspecified: Secondary | ICD-10-CM

## 2016-06-29 DIAGNOSIS — M7918 Myalgia, other site: Secondary | ICD-10-CM | POA: Insufficient documentation

## 2016-06-29 DIAGNOSIS — Z0101 Encounter for examination of eyes and vision with abnormal findings: Secondary | ICD-10-CM | POA: Insufficient documentation

## 2016-06-29 DIAGNOSIS — N400 Enlarged prostate without lower urinary tract symptoms: Secondary | ICD-10-CM

## 2016-06-29 DIAGNOSIS — Z0000 Encounter for general adult medical examination without abnormal findings: Secondary | ICD-10-CM

## 2016-06-29 DIAGNOSIS — E041 Nontoxic single thyroid nodule: Secondary | ICD-10-CM

## 2016-06-29 DIAGNOSIS — G40909 Epilepsy, unspecified, not intractable, without status epilepticus: Secondary | ICD-10-CM

## 2016-06-29 DIAGNOSIS — I10 Essential (primary) hypertension: Secondary | ICD-10-CM

## 2016-06-29 DIAGNOSIS — E785 Hyperlipidemia, unspecified: Secondary | ICD-10-CM

## 2016-06-29 MED ORDER — HYDROCHLOROTHIAZIDE 12.5 MG PO CAPS: ORAL_CAPSULE | ORAL | Status: DC

## 2016-06-29 MED ORDER — PHENYTOIN SODIUM EXTENDED 100 MG PO CAPS: 200.0000 mg | ORAL_CAPSULE | Freq: Two times a day (BID) | ORAL | Status: DC

## 2016-06-29 MED ORDER — RAMIPRIL 10 MG PO CAPS: 10.0000 mg | ORAL_CAPSULE | Freq: Every day | ORAL | Status: DC

## 2016-06-29 MED ORDER — TERAZOSIN HCL 10 MG PO CAPS: 10.0000 mg | ORAL_CAPSULE | Freq: Every day | ORAL | Status: DC

## 2016-06-29 NOTE — Patient Instructions (Addendum)
Pass by lab to pick up stool kit. R aircast brace provided today.  I think you have piriformis syndrome or irritation of sciatic nerve as it crosses the piriformis muscle - treat with exercises provided today.  We will send lab results to Dr Jacqlyn Larsen. Watch added sugars in the diet.  Schedule follow up with eye doctor for vision change. Return as needed or in 1 year for next wellness visit  Health Maintenance, Male A healthy lifestyle and preventative care can promote health and wellness.  Maintain regular health, dental, and eye exams.  Eat a healthy diet. Foods like vegetables, fruits, whole grains, low-fat dairy products, and lean protein foods contain the nutrients you need and are low in calories. Decrease your intake of foods high in solid fats, added sugars, and salt. Get information about a proper diet from your health care provider, if necessary.  Regular physical exercise is one of the most important things you can do for your health. Most adults should get at least 150 minutes of moderate-intensity exercise (any activity that increases your heart rate and causes you to sweat) each week. In addition, most adults need muscle-strengthening exercises on 2 or more days a week.   Maintain a healthy weight. The body mass index (BMI) is a screening tool to identify possible weight problems. It provides an estimate of body fat based on height and weight. Your health care provider can find your BMI and can help you achieve or maintain a healthy weight. For males 20 years and older:  A BMI below 18.5 is considered underweight.  A BMI of 18.5 to 24.9 is normal.  A BMI of 25 to 29.9 is considered overweight.  A BMI of 30 and above is considered obese.  Maintain normal blood lipids and cholesterol by exercising and minimizing your intake of saturated fat. Eat a balanced diet with plenty of fruits and vegetables. Blood tests for lipids and cholesterol should begin at age 45 and be repeated every  5 years. If your lipid or cholesterol levels are high, you are over age 62, or you are at high risk for heart disease, you may need your cholesterol levels checked more frequently.Ongoing high lipid and cholesterol levels should be treated with medicines if diet and exercise are not working.  If you smoke, find out from your health care provider how to quit. If you do not use tobacco, do not start.  Lung cancer screening is recommended for adults aged 39-80 years who are at high risk for developing lung cancer because of a history of smoking. A yearly low-dose CT scan of the lungs is recommended for people who have at least a 30-pack-year history of smoking and are current smokers or have quit within the past 15 years. A pack year of smoking is smoking an average of 1 pack of cigarettes a day for 1 year (for example, a 30-pack-year history of smoking could mean smoking 1 pack a day for 30 years or 2 packs a day for 15 years). Yearly screening should continue until the smoker has stopped smoking for at least 15 years. Yearly screening should be stopped for people who develop a health problem that would prevent them from having lung cancer treatment.  If you choose to drink alcohol, do not have more than 2 drinks per day. One drink is considered to be 12 oz (360 mL) of beer, 5 oz (150 mL) of wine, or 1.5 oz (45 mL) of liquor.  Avoid the use of street  drugs. Do not share needles with anyone. Ask for help if you need support or instructions about stopping the use of drugs.  High blood pressure causes heart disease and increases the risk of stroke. High blood pressure is more likely to develop in:  People who have blood pressure in the end of the normal range (100-139/85-89 mm Hg).  People who are overweight or obese.  People who are African American.  If you are 18-42 years of age, have your blood pressure checked every 3-5 years. If you are 70 years of age or older, have your blood pressure checked  every year. You should have your blood pressure measured twice--once when you are at a hospital or clinic, and once when you are not at a hospital or clinic. Record the average of the two measurements. To check your blood pressure when you are not at a hospital or clinic, you can use:  An automated blood pressure machine at a pharmacy.  A home blood pressure monitor.  If you are 53-75 years old, ask your health care provider if you should take aspirin to prevent heart disease.  Diabetes screening involves taking a blood sample to check your fasting blood sugar level. This should be done once every 3 years after age 64 if you are at a normal weight and without risk factors for diabetes. Testing should be considered at a younger age or be carried out more frequently if you are overweight and have at least 1 risk factor for diabetes.  Colorectal cancer can be detected and often prevented. Most routine colorectal cancer screening begins at the age of 16 and continues through age 9. However, your health care provider may recommend screening at an earlier age if you have risk factors for colon cancer. On a yearly basis, your health care provider may provide home test kits to check for hidden blood in the stool. A small camera at the end of a tube may be used to directly examine the colon (sigmoidoscopy or colonoscopy) to detect the earliest forms of colorectal cancer. Talk to your health care provider about this at age 76 when routine screening begins. A direct exam of the colon should be repeated every 5-10 years through age 80, unless early forms of precancerous polyps or small growths are found.  People who are at an increased risk for hepatitis B should be screened for this virus. You are considered at high risk for hepatitis B if:  You were born in a country where hepatitis B occurs often. Talk with your health care provider about which countries are considered high risk.  Your parents were born in a  high-risk country and you have not received a shot to protect against hepatitis B (hepatitis B vaccine).  You have HIV or AIDS.  You use needles to inject street drugs.  You live with, or have sex with, someone who has hepatitis B.  You are a man who has sex with other men (MSM).  You get hemodialysis treatment.  You take certain medicines for conditions like cancer, organ transplantation, and autoimmune conditions.  Hepatitis C blood testing is recommended for all people born from [birth year] through 1963 and any individual with known risk factors for hepatitis C.  Healthy men should no longer receive prostate-specific antigen (PSA) blood tests as part of routine cancer screening. Talk to your health care provider about prostate cancer screening.  Testicular cancer screening is not recommended for adolescents or adult males who have no symptoms. Screening includes  self-exam, a health care provider exam, and other screening tests. Consult with your health care provider about any symptoms you have or any concerns you have about testicular cancer.  Practice safe sex. Use condoms and avoid high-risk sexual practices to reduce the spread of sexually transmitted infections (STIs).  You should be screened for STIs, including gonorrhea and chlamydia if:  You are sexually active and are younger than 24 years.  You are older than 24 years, and your health care provider tells you that you are at risk for this type of infection.  Your sexual activity has changed since you were last screened, and you are at an increased risk for chlamydia or gonorrhea. Ask your health care provider if you are at risk.  If you are at risk of being infected with HIV, it is recommended that you take a prescription medicine daily to prevent HIV infection. This is called pre-exposure prophylaxis (PrEP). You are considered at risk if:  You are a man who has sex with other men (MSM).  You are a heterosexual man who is  sexually active with multiple partners.  You take drugs by injection.  You are sexually active with a partner who has HIV.  Talk with your health care provider about whether you are at high risk of being infected with HIV. If you choose to begin PrEP, you should first be tested for HIV. You should then be tested every 3 months for as long as you are taking PrEP.  Use sunscreen. Apply sunscreen liberally and repeatedly throughout the day. You should seek shade when your shadow is shorter than you. Protect yourself by wearing long sleeves, pants, a wide-brimmed hat, and sunglasses year round whenever you are outdoors.  Tell your health care provider of new moles or changes in moles, especially if there is a change in shape or color. Also, tell your health care provider if a mole is larger than the size of a pencil eraser.  A one-time screening for abdominal aortic aneurysm (AAA) and surgical repair of large AAAs by ultrasound is recommended for men aged 88-75 years who are current or former smokers.  Stay current with your vaccines (immunizations).   This information is not intended to replace advice given to you by your health care provider. Make sure you discuss any questions you have with your health care provider.   Document Released: 06/04/2008 Document Revised: 12/28/2014 Document Reviewed: 05/04/2011 Elsevier Interactive Patient Education [employer].

## 2016-06-29 NOTE — Assessment & Plan Note (Signed)
Continue phenytoin. No recent seizure.

## 2016-06-29 NOTE — Assessment & Plan Note (Signed)

## 2016-06-29 NOTE — Assessment & Plan Note (Signed)
Not appreciated today.  

## 2016-06-29 NOTE — Assessment & Plan Note (Addendum)
To reschedule appt with ophtho.

## 2016-06-29 NOTE — Assessment & Plan Note (Signed)
Chronic, stable. Continue current regimen. 

## 2016-06-29 NOTE — Progress Notes (Signed)
BP 140/70 mmHg  Pulse 64  Temp(Src) 98.2 F (36.8 C) (Oral)  Ht 5' 7.75" (1.721 m)  Wt 176 lb 4 oz (79.946 kg)  BMI 26.99 kg/m2   CC: medicare wellness visit/CPE Subjective:    Patient ID: Mark Holmes, male    DOB: November 04, 1935, 81 y.o.   MRN: [id number]  HPI: ARIAM LAMPER is a 81 y.o. male presenting on 06/29/2016 for Annual Exam   Sees Dr Jacqlyn Larsen Q1 yr regularly for BPH/h/o elevated PSA. On terazosin and dutasteride.   Using air cast brace for ankle pain. Started after fall out of cherry tree 1999. xrays 11 showed old trauma and degenerative arthritis changes. Requests new air cast brace which is very helpful.   Also with ongoing R hip pain. No shooting pain down leg. No groin pain. No back pain.   Passes hearing and vision screens today (failed R eye). To schedule f/u with ophtho.  Denies anhedonia, depression. No falls in last year.  Preventative: Prostate - followed by uro (Dr. Jacqlyn Larsen), last PSA 3.0, stable. H/o BPH on avodart and terazosin. Does get PSA checked.  Colon cancer screening - has not had colonoscopy. Reassuring iFOB previously. Flu shot - does not receive. Pneumovax 2011, prevnar 2015. Tetanus shot Td 2011.  Shingles shot - has had shingles infection. Declines.  Advanced directives: has living will at home. HCPOA would be daughter. Would be ok with CPR and temporary trial of life support if reversible condition. Does not want prolonged life support. Does not want copy in our chart at this time - states daughter will be available. Seat belt use discussed. Sunscreen use discussed. No changing moles.   Caffeine: 1 cup/day, 2 excedrin/day  Married since 1959  2 daughters  Occupation: retired, was Banker)  Activity: yardwork, walking dog, stays active with grandson (swimming) Diet: avoids carbs, good water, fruits/vegetables daily  Relevant past medical, surgical, family and social history reviewed and updated as  indicated. Interim medical history since our last visit reviewed. Allergies and medications reviewed and updated. Current Outpatient Prescriptions on File Prior to Visit  Medication Sig  . aspirin-acetaminophen-caffeine (EXCEDRIN MIGRAINE) 250-250-65 MG per tablet Take 1 tablet by mouth 2 (two) times daily as needed.   . Cholecalciferol (VITAMIN D3) 1000 UNITS CAPS Take by mouth daily.    . Cyanocobalamin (B-12) 500 MCG TABS Take by mouth daily.    Marland Kitchen dutasteride (AVODART) 0.5 MG capsule Take 0.5 mg by mouth as directed. Every 3 days  . ferrous fumarate (HEMOCYTE - 106 MG FE) 325 (106 FE) MG TABS tablet Take 1 tablet by mouth daily.  . fish oil-omega-3 fatty acids 1000 MG capsule Take 2 g by mouth daily. As directed   . nitroGLYCERIN (NITROSTAT) 0.4 MG SL tablet Place 1 tablet (0.4 mg total) under the tongue every 5 (five) minutes as needed for chest pain.  Marland Kitchen Pyridoxine HCl (VITAMIN B-6 PO) Take 100 mg by mouth daily.   No current facility-administered medications on file prior to visit.    Review of Systems  Constitutional: Negative for fever, chills, activity change, appetite change, fatigue and unexpected weight change.  HENT: Negative for hearing loss.   Eyes: Positive for visual disturbance.  Respiratory: Negative for cough, chest tightness, shortness of breath and wheezing.   Cardiovascular: Negative for chest pain, palpitations and leg swelling.  Gastrointestinal: Negative for nausea, vomiting, abdominal pain, diarrhea, constipation, blood in stool and abdominal distention.  Genitourinary: Negative for hematuria and difficulty urinating.  Musculoskeletal: Negative for myalgias, arthralgias and neck pain.  Skin: Negative for rash.  Neurological: Positive for dizziness. Negative for seizures, syncope and headaches.  Hematological: Negative for adenopathy. Does not bruise/bleed easily.  Psychiatric/Behavioral: Negative for dysphoric mood. The patient is not nervous/anxious.    Per  HPI unless specifically indicated in ROS section     Objective:    BP 140/70 mmHg  Pulse 64  Temp(Src) 98.2 F (36.8 C) (Oral)  Ht 5' 7.75" (1.721 m)  Wt 176 lb 4 oz (79.946 kg)  BMI 26.99 kg/m2  Wt Readings from Last 3 Encounters:  06/29/16 176 lb 4 oz (79.946 kg)  12/13/15 179 lb (81.194 kg)  06/21/15 177 lb 12.8 oz (80.65 kg)    Physical Exam  Constitutional: He is oriented to person, place, and time. He appears well-developed and well-nourished. No distress.  HENT:  Head: Normocephalic and atraumatic.  Right Ear: Hearing, tympanic membrane, external ear and ear canal normal.  Left Ear: Hearing, tympanic membrane, external ear and ear canal normal.  Nose: Nose normal.  Mouth/Throat: Uvula is midline, oropharynx is clear and moist and mucous membranes are normal. No oropharyngeal exudate, posterior oropharyngeal edema or posterior oropharyngeal erythema.  Eyes: Conjunctivae and EOM are normal. Pupils are equal, round, and reactive to light. No scleral icterus.  Neck: Normal range of motion. Neck supple. Carotid bruit is not present. No thyromegaly present.  Cardiovascular: Normal rate, regular rhythm, normal heart sounds and intact distal pulses.   No murmur heard. Pulses:      Radial pulses are 2+ on the right side, and 2+ on the left side.  Pulmonary/Chest: Effort normal and breath sounds normal. No respiratory distress. He has no wheezes. He has no rales.  Abdominal: Soft. Bowel sounds are normal. He exhibits no distension and no mass. There is no tenderness. There is no rebound and no guarding.  Musculoskeletal: Normal range of motion. He exhibits no edema.  Lymphadenopathy:    He has no cervical adenopathy.  Neurological: He is alert and oriented to person, place, and time.  CN grossly intact, station and gait intact Recall 3/3 Calculation 4/5 serial 7s  Skin: Skin is warm and dry. No rash noted.  Psychiatric: He has a normal mood and affect. His behavior is normal.  Judgment and thought content normal.  Nursing note and vitals reviewed.  Results for orders placed or performed in visit on 06/15/16  Lipid panel  Result Value Ref Range   Cholesterol 159 0 - 200 mg/dL   Triglycerides 110.0 0.0 - 149.0 mg/dL   HDL 39.80 >39.00 mg/dL   VLDL 22.0 0.0 - 40.0 mg/dL   LDL Cholesterol 97 0 - 99 mg/dL   Total CHOL/HDL Ratio 4    NonHDL 119.25   Comprehensive metabolic panel  Result Value Ref Range   Sodium 141 135 - 145 mEq/L   Potassium 3.7 3.5 - 5.1 mEq/L   Chloride 106 96 - 112 mEq/L   CO2 28 19 - 32 mEq/L   Glucose, Bld 107 (H) 70 - 99 mg/dL   BUN 22 6 - 23 mg/dL   Creatinine, Ser 1.13 0.40 - 1.50 mg/dL   Total Bilirubin 0.3 0.2 - 1.2 mg/dL   Alkaline Phosphatase 75 39 - 117 U/L   AST 13 0 - 37 U/L   ALT 13 0 - 53 U/L   Total Protein 6.3 6.0 - 8.3 g/dL   Albumin 3.8 3.5 - 5.2 g/dL   Calcium 9.2 8.4 - 10.5 mg/dL  GFR 66.60 >60.00 mL/min  TSH  Result Value Ref Range   TSH 0.47 0.35 - 4.50 uIU/mL  CBC with Differential/Platelet  Result Value Ref Range   WBC 7.0 4.0 - 10.5 K/uL   RBC 4.39 4.22 - 5.81 Mil/uL   Hemoglobin 12.9 (L) 13.0 - 17.0 g/dL   HCT 38.3 (L) 39.0 - 52.0 %   MCV 87.4 78.0 - 100.0 fl   MCHC 33.6 30.0 - 36.0 g/dL   RDW 14.0 11.5 - 15.5 %   Platelets 253.0 150.0 - 400.0 K/uL   Neutrophils Relative % 70.8 43.0 - 77.0 %   Lymphocytes Relative 19.0 12.0 - 46.0 %   Monocytes Relative 6.9 3.0 - 12.0 %   Eosinophils Relative 2.9 0.0 - 5.0 %   Basophils Relative 0.4 0.0 - 3.0 %   Neutro Abs 4.9 1.4 - 7.7 K/uL   Lymphs Abs 1.3 0.7 - 4.0 K/uL   Monocytes Absolute 0.5 0.1 - 1.0 K/uL   Eosinophils Absolute 0.2 0.0 - 0.7 K/uL   Basophils Absolute 0.0 0.0 - 0.1 K/uL  VITAMIN D 25 Hydroxy (Vit-D Deficiency, Fractures)  Result Value Ref Range   VITD 41.78 30.00 - 100.00 ng/mL  Phenytoin level, free  Result Value Ref Range   Phenytoin, Free 1.3 1.0 - 2.0 ug/mL      Assessment & Plan:   Problem List Items Addressed This Visit      THYROID NODULE    Not appreciated today.       HLD (hyperlipidemia)    Chronic, stable off meds.       Relevant Medications   terazosin (HYTRIN) 10 MG capsule   ramipril (ALTACE) 10 MG capsule   hydrochlorothiazide (MICROZIDE) 12.5 MG capsule   Essential hypertension    Chronic, stable. Continue current regimen.       Relevant Medications   terazosin (HYTRIN) 10 MG capsule   ramipril (ALTACE) 10 MG capsule   hydrochlorothiazide (MICROZIDE) 12.5 MG capsule   BPH with elevated PSA    Continue f/u with Dr Jacqlyn Larsen.      Seizure disorder (HCC)    Continue phenytoin. No recent seizure.      Vitamin D deficiency    S/p adequate replacement.       Medicare annual wellness visit, subsequent - Primary    I have personally reviewed the Medicare Annual Wellness questionnaire and have noted 1. The patient's medical and social history 2. Their use of alcohol, tobacco or illicit drugs 3. Their current medications and supplements 4. The patient's functional ability including ADL's, fall risks, home safety risks and hearing or visual impairment. Cognitive function has been assessed and addressed as indicated.  5. Diet and physical activity 6. Evidence for depression or mood disorders The patients weight, height, BMI have been recorded in the chart. I have made referrals, counseling and provided education to the patient based on review of the above and I have provided the pt with a written personalized care plan for preventive services. Provider list updated.. See scanned questionairre as needed for further documentation. Reviewed preventative protocols and updated unless pt declined.       Health maintenance examination    Preventative protocols reviewed and updated unless pt declined. Discussed healthy diet and lifestyle.       Chronic ankle pain    Chronic R ankle pain after remote trauma after fall out of cherry tree. Air cast has been helpful. Will provide with new cast.  Advanced care planning/counseling discussion    Advanced directives: has living will at home. HCPOA would be daughter. Would be ok with CPR and temporary trial of life support if reversible condition. Does not want prolonged life support. Does not want copy in our chart at this time - states daughter will be available.      Right buttock pain    Anticipate piriformis syndrome - treat with stretching exercises provided today      Failed vision screen    To reschedule appt with ophtho.           Follow up plan: Return in about 1 year (around 06/29/2017), or as needed, for medicare wellness visit.  Ria Bush, MD

## 2016-06-29 NOTE — Assessment & Plan Note (Signed)
Continue f/u with Dr Cope. 

## 2016-06-29 NOTE — Assessment & Plan Note (Signed)
Preventative protocols reviewed and updated unless pt declined. Discussed healthy diet and lifestyle.  

## 2016-06-29 NOTE — Assessment & Plan Note (Signed)
Anticipate piriformis syndrome - treat with stretching exercises provided today

## 2016-06-29 NOTE — Assessment & Plan Note (Signed)
Chronic, stable off meds.  

## 2016-06-29 NOTE — Assessment & Plan Note (Signed)
S/p adequate replacement.

## 2016-06-29 NOTE — Progress Notes (Signed)
Pre visit review using our clinic review tool, if applicable. No additional management support is needed unless otherwise documented below in the visit note. 

## 2016-06-29 NOTE — Assessment & Plan Note (Signed)
Advanced directives: has living will at home. HCPOA would be daughter. Would be ok with CPR and temporary trial of life support if reversible condition. Does not want prolonged life support. Does not want copy in our chart at this time - states daughter will be available. 

## 2016-06-29 NOTE — Assessment & Plan Note (Signed)
Chronic R ankle pain after remote trauma after fall out of cherry tree. Air cast has been helpful. Will provide with new cast.

## 2016-07-06 ENCOUNTER — Other Ambulatory Visit: Payer: Medicare Other

## 2016-07-06 DIAGNOSIS — Z1211 Encounter for screening for malignant neoplasm of colon: Secondary | ICD-10-CM

## 2016-07-06 LAB — FECAL OCCULT BLOOD, GUAIAC: Fecal Occult Blood: NEGATIVE

## 2016-07-07 ENCOUNTER — Encounter: Payer: Self-pay | Admitting: *Deleted

## 2016-07-07 LAB — FECAL OCCULT BLOOD, IMMUNOCHEMICAL: Fecal Occult Bld: NEGATIVE

## 2017-06-25 ENCOUNTER — Other Ambulatory Visit: Payer: Self-pay

## 2017-06-25 MED ORDER — PHENYTOIN SODIUM EXTENDED 100 MG PO CAPS: 200.0000 mg | ORAL_CAPSULE | Freq: Two times a day (BID) | ORAL | 3 refills | Status: DC

## 2017-06-25 NOTE — Telephone Encounter (Signed)
Pt left v/m requesting [facility] day refill [facility]pt last seen annual exam and rx last refilled # [phone] x 3 on 06/29/16. Pt request refill to be done 06/25/17. Pt has CPX scheduled no 07/02/17.

## 2017-06-26 ENCOUNTER — Other Ambulatory Visit: Payer: Self-pay | Admitting: Family Medicine

## 2017-06-26 DIAGNOSIS — N400 Enlarged prostate without lower urinary tract symptoms: Secondary | ICD-10-CM

## 2017-06-26 DIAGNOSIS — R972 Elevated prostate specific antigen [PSA]: Secondary | ICD-10-CM

## 2017-06-26 DIAGNOSIS — D649 Anemia, unspecified: Secondary | ICD-10-CM

## 2017-06-26 DIAGNOSIS — I10 Essential (primary) hypertension: Secondary | ICD-10-CM

## 2017-06-26 DIAGNOSIS — E559 Vitamin D deficiency, unspecified: Secondary | ICD-10-CM

## 2017-06-26 DIAGNOSIS — E785 Hyperlipidemia, unspecified: Secondary | ICD-10-CM

## 2017-06-26 DIAGNOSIS — G40909 Epilepsy, unspecified, not intractable, without status epilepticus: Secondary | ICD-10-CM

## 2017-06-28 ENCOUNTER — Other Ambulatory Visit (INDEPENDENT_AMBULATORY_CARE_PROVIDER_SITE_OTHER): Payer: Medicare Other

## 2017-06-28 DIAGNOSIS — E559 Vitamin D deficiency, unspecified: Secondary | ICD-10-CM | POA: Diagnosis not present

## 2017-06-28 DIAGNOSIS — I10 Essential (primary) hypertension: Secondary | ICD-10-CM | POA: Diagnosis not present

## 2017-06-28 DIAGNOSIS — D649 Anemia, unspecified: Secondary | ICD-10-CM | POA: Diagnosis not present

## 2017-06-28 DIAGNOSIS — E785 Hyperlipidemia, unspecified: Secondary | ICD-10-CM | POA: Diagnosis not present

## 2017-06-28 DIAGNOSIS — R972 Elevated prostate specific antigen [PSA]: Secondary | ICD-10-CM

## 2017-06-28 DIAGNOSIS — N400 Enlarged prostate without lower urinary tract symptoms: Secondary | ICD-10-CM | POA: Diagnosis not present

## 2017-06-28 LAB — CBC WITH DIFFERENTIAL/PLATELET
BASOS PCT: 0.9 % (ref 0.0–3.0)
Basophils Absolute: 0.1 10*3/uL (ref 0.0–0.1)
EOS PCT: 2.2 % (ref 0.0–5.0)
Eosinophils Absolute: 0.2 10*3/uL (ref 0.0–0.7)
HCT: 38.8 % — ABNORMAL LOW (ref 39.0–52.0)
Hemoglobin: 13.6 g/dL (ref 13.0–17.0)
LYMPHS ABS: 1.7 10*3/uL (ref 0.7–4.0)
Lymphocytes Relative: 21.7 % (ref 12.0–46.0)
MCHC: 35 g/dL (ref 30.0–36.0)
MCV: 87.5 fl (ref 78.0–100.0)
MONO ABS: 0.6 10*3/uL (ref 0.1–1.0)
MONOS PCT: 7.3 % (ref 3.0–12.0)
NEUTROS PCT: 67.9 % (ref 43.0–77.0)
Neutro Abs: 5.3 10*3/uL (ref 1.4–7.7)
Platelets: 253 10*3/uL (ref 150.0–400.0)
RBC: 4.43 Mil/uL (ref 4.22–5.81)
RDW: 13.9 % (ref 11.5–15.5)
WBC: 7.8 10*3/uL (ref 4.0–10.5)

## 2017-06-28 LAB — LIPID PANEL
CHOLESTEROL: 189 mg/dL (ref 0–200)
HDL: 46.4 mg/dL (ref >39.00)
LDL CALC: 118 mg/dL — AB (ref 0–99)
NONHDL: 142.88
Total CHOL/HDL Ratio: 4
Triglycerides: 125 mg/dL (ref 0.0–149.0)
VLDL: 25 mg/dL (ref 0.0–40.0)

## 2017-06-28 LAB — BASIC METABOLIC PANEL
BUN: 19 mg/dL (ref 6–23)
CALCIUM: 9.7 mg/dL (ref 8.4–10.5)
CO2: 29 mEq/L (ref 19–32)
CREATININE: 1.07 mg/dL (ref 0.40–1.50)
Chloride: 102 mEq/L (ref 96–112)
GFR: 70.73 mL/min (ref >60.00)
GLUCOSE: 98 mg/dL (ref 70–99)
Potassium: 3.7 mEq/L (ref 3.5–5.1)
SODIUM: 138 meq/L (ref 135–145)

## 2017-06-28 LAB — FERRITIN: Ferritin: 97.1 ng/mL (ref 22.0–322.0)

## 2017-06-28 LAB — VITAMIN B12: Vitamin B-12: >1500 pg/mL — ABNORMAL HIGH (ref 211–911)

## 2017-06-28 LAB — VITAMIN D 25 HYDROXY (VIT D DEFICIENCY, FRACTURES): VITD: 31.91 ng/mL (ref 30.00–100.00)

## 2017-06-28 LAB — TSH: TSH: 0.52 u[IU]/mL (ref 0.35–4.50)

## 2017-06-28 LAB — PSA: PSA: 4.55 ng/mL — ABNORMAL HIGH (ref 0.10–4.00)

## 2017-07-02 ENCOUNTER — Ambulatory Visit (INDEPENDENT_AMBULATORY_CARE_PROVIDER_SITE_OTHER): Payer: Medicare Other | Admitting: Family Medicine

## 2017-07-02 ENCOUNTER — Encounter: Payer: Self-pay | Admitting: Family Medicine

## 2017-07-02 VITALS — BP 120/70 | HR 82 | Ht 67.0 in | Wt 169.0 lb

## 2017-07-02 DIAGNOSIS — R972 Elevated prostate specific antigen [PSA]: Secondary | ICD-10-CM

## 2017-07-02 DIAGNOSIS — Z0000 Encounter for general adult medical examination without abnormal findings: Secondary | ICD-10-CM | POA: Diagnosis not present

## 2017-07-02 DIAGNOSIS — E785 Hyperlipidemia, unspecified: Secondary | ICD-10-CM

## 2017-07-02 DIAGNOSIS — G40909 Epilepsy, unspecified, not intractable, without status epilepticus: Secondary | ICD-10-CM

## 2017-07-02 DIAGNOSIS — N400 Enlarged prostate without lower urinary tract symptoms: Secondary | ICD-10-CM

## 2017-07-02 DIAGNOSIS — I10 Essential (primary) hypertension: Secondary | ICD-10-CM

## 2017-07-02 DIAGNOSIS — M7918 Myalgia, other site: Secondary | ICD-10-CM

## 2017-07-02 DIAGNOSIS — Z7189 Other specified counseling: Secondary | ICD-10-CM

## 2017-07-02 MED ORDER — TERAZOSIN HCL 10 MG PO CAPS: 10.0000 mg | ORAL_CAPSULE | Freq: Every day | ORAL | 3 refills | Status: DC

## 2017-07-02 MED ORDER — HYDROCHLOROTHIAZIDE 12.5 MG PO CAPS: ORAL_CAPSULE | ORAL | 3 refills | Status: DC

## 2017-07-02 MED ORDER — RAMIPRIL 10 MG PO CAPS: 10.0000 mg | ORAL_CAPSULE | Freq: Every day | ORAL | 3 refills | Status: DC

## 2017-07-02 NOTE — Assessment & Plan Note (Signed)

## 2017-07-02 NOTE — Assessment & Plan Note (Signed)
Continue dilantin. Will need updated phenytoin levels next labs.

## 2017-07-02 NOTE — Patient Instructions (Addendum)
Pass by lab to pick up stool kit. Return as needed or in 1 year for next physical and medicare wellness visit.  Hip joints look ok as does spine. I wonder if this is dysfunction from weakness of pelvic/hip muscles - start lateral hip raises - both laying down and standing. Let us know if not improving.   Health Maintenance, Male A healthy lifestyle and preventive care is important for your health and wellness. Ask your health care provider about what schedule of regular examinations is right for you. What should I know about weight and diet? Eat a Healthy Diet  Eat plenty of vegetables, fruits, whole grains, low-fat dairy products, and lean protein.  Do not eat a lot of foods high in solid fats, added sugars, or salt.  Maintain a Healthy Weight Regular exercise can help you achieve or maintain a healthy weight. You should:  Do at least 150 minutes of exercise each week. The exercise should increase your heart rate and make you sweat (moderate-intensity exercise).  Do strength-training exercises at least twice a week.  Watch Your Levels of Cholesterol and Blood Lipids  Have your blood tested for lipids and cholesterol every 5 years starting at 80 years of age. If you are at high risk for heart disease, you should start having your blood tested when you are 80 years old. You may need to have your cholesterol levels checked more often if: ? Your lipid or cholesterol levels are high. ? You are older than 80 years of age. ? You are at high risk for heart disease.  What should I know about cancer screening? Many types of cancers can be detected early and may often be prevented. Lung Cancer  You should be screened every year for lung cancer if: ? You are a current smoker who has smoked for at least 30 years. ? You are a former smoker who has quit within the past 15 years.  Talk to your health care provider about your screening options, when you should start screening, and how often you  should be screened.  Colorectal Cancer  Routine colorectal cancer screening usually begins at 80 years of age and should be repeated every 5-10 years until you are 80 years old. You may need to be screened more often if early forms of precancerous polyps or small growths are found. Your health care provider may recommend screening at an earlier age if you have risk factors for colon cancer.  Your health care provider may recommend using home test kits to check for hidden blood in the stool.  A small camera at the end of a tube can be used to examine your colon (sigmoidoscopy or colonoscopy). This checks for the earliest forms of colorectal cancer.  Prostate and Testicular Cancer  Depending on your age and overall health, your health care provider may do certain tests to screen for prostate and testicular cancer.  Talk to your health care provider about any symptoms or concerns you have about testicular or prostate cancer.  Skin Cancer  Check your skin from head to toe regularly.  Tell your health care provider about any new moles or changes in moles, especially if: ? There is a change in a mole's size, shape, or color. ? You have a mole that is larger than a pencil eraser.  Always use sunscreen. Apply sunscreen liberally and repeat throughout the day.  Protect yourself by wearing long sleeves, pants, a wide-brimmed hat, and sunglasses when outside.  What should I  know about heart disease, diabetes, and high blood pressure?  If you are 20-46 years of age, have your blood pressure checked every 3-5 years. If you are 58 years of age or older, have your blood pressure checked every year. You should have your blood pressure measured twice-once when you are at a hospital or clinic, and once when you are not at a hospital or clinic. Record the average of the two measurements. To check your blood pressure when you are not at a hospital or clinic, you can use: ? An automated blood pressure  machine at a pharmacy. ? A home blood pressure monitor.  Talk to your health care provider about your target blood pressure.  If you are between 21-56 years old, ask your health care provider if you should take aspirin to prevent heart disease.  Have regular diabetes screenings by checking your fasting blood sugar level. ? If you are at a normal weight and have a low risk for diabetes, have this test once every three years after the age of 53. ? If you are overweight and have a high risk for diabetes, consider being tested at a younger age or more often.  A one-time screening for abdominal aortic aneurysm (AAA) by ultrasound is recommended for men aged 46-75 years who are current or former smokers. What should I know about preventing infection? Hepatitis B If you have a higher risk for hepatitis B, you should be screened for this virus. Talk with your health care provider to find out if you are at risk for hepatitis B infection. Hepatitis C Blood testing is recommended for:  Everyone born from [birth year] through 1963.  Anyone with known risk factors for hepatitis C.  Sexually Transmitted Diseases (STDs)  You should be screened each year for STDs including gonorrhea and chlamydia if: ? You are sexually active and are younger than 80 years of age. ? You are older than 80 years of age and your health care provider tells you that you are at risk for this type of infection. ? Your sexual activity has changed since you were last screened and you are at an increased risk for chlamydia or gonorrhea. Ask your health care provider if you are at risk.  Talk with your health care provider about whether you are at high risk of being infected with HIV. Your health care provider may recommend a prescription medicine to help prevent HIV infection.  What else can I do?  Schedule regular health, dental, and eye exams.  Stay current with your vaccines (immunizations).  Do not use any tobacco products,  such as cigarettes, chewing tobacco, and e-cigarettes. If you need help quitting, ask your health care provider.  Limit alcohol intake to no more than 2 drinks per day. One drink equals 12 ounces of beer, 5 ounces of wine, or 1 ounces of hard liquor.  Do not use street drugs.  Do not share needles.  Ask your health care provider for help if you need support or information about quitting drugs.  Tell your health care provider if you often feel depressed.  Tell your health care provider if you have ever been abused or do not feel safe at home. This information is not intended to replace advice given to you by your health care provider. Make sure you discuss any questions you have with your health care provider. Document Released: 06/04/2008 Document Revised: 08/05/2016 Document Reviewed: 09/10/2015 Elsevier Interactive Patient Education  [employer].

## 2017-07-02 NOTE — Progress Notes (Signed)
BP 120/70   Pulse 82   Ht 5\' 7"  (1.702 m)   Wt 169 lb (76.7 kg)   SpO2 96%   BMI 26.47 kg/m    CC: medicare wellness visit Subjective:    Patient ID: Mark Holmes, male    DOB: [birth year], 82 y.o.   MRN: [id number]  HPI: Mark Holmes is a 82 y.o. male presenting on 07/02/2017 for Medicare Wellness   Ongoing joint aches, now noticing bilateral hips.   Passed fall screen Depression screen passed Some trouble left hearing but pt has not noticed this Saw eye doctor this year.  Preventative: Prostate - followed by uro (Dr. Jacqlyn Larsen). H/o BPH on avodart and terazosin. Does get PSA checked.  Colon cancer screening - has not had colonoscopy. Reassuring iFOB previously.  Flu shot - does not receive. Pneumovax 2011, prevnar 2015. Tetanus shot Td 2011.  Shingles shot - has had shingles infection. Declines.  Advanced directives: has living will at home. HCPOA would be daughter. Would be ok with CPR and temporary trial of life support if reversible condition. Does not want prolonged life support. Does not want copy in our chart at this time - states daughter will be available. Seat belt use discussed.  Sunscreen use discussed. No changing moles.  Non smoker Alcohol - rare  Caffeine: 1 cup/day, 2 excedrin/day  Married since 1959  2 daughters  Occupation: retired, was Banker)  Activity: walking dog, stays active with grandson (swimming) Diet: avoids carbs, good water, fruits/vegetables daily  Relevant past medical, surgical, family and social history reviewed and updated as indicated. Interim medical history since our last visit reviewed. Allergies and medications reviewed and updated. Outpatient Medications Prior to Visit  Medication Sig Dispense Refill  . aspirin-acetaminophen-caffeine (EXCEDRIN MIGRAINE) 250-250-65 MG per tablet Take 1 tablet by mouth 2 (two) times daily as needed.     . Cholecalciferol (VITAMIN D3) 1000 UNITS CAPS Take by  mouth daily.      . Cyanocobalamin (B-12) 500 MCG TABS Take by mouth daily.      Marland Kitchen dutasteride (AVODART) 0.5 MG capsule Take 0.5 mg by mouth as directed. Every 3 days    . ferrous fumarate (HEMOCYTE - 106 MG FE) 325 (106 FE) MG TABS tablet Take 1 tablet by mouth daily.    . fish oil-omega-3 fatty acids 1000 MG capsule Take 2 g by mouth daily. As directed     . nitroGLYCERIN (NITROSTAT) 0.4 MG SL tablet Place 1 tablet (0.4 mg total) under the tongue every 5 (five) minutes as needed for chest pain. 50 tablet 3  . phenytoin (DILANTIN) 100 MG ER capsule Take 2 capsules (200 mg total) by mouth 2 (two) times daily. 360 capsule 3  . Pyridoxine HCl (VITAMIN B-6 PO) Take 100 mg by mouth daily.    . hydrochlorothiazide (MICROZIDE) 12.5 MG capsule TAKE ONE CAPSULE BY MOUTH EVERY MORNING 90 capsule 3  . ramipril (ALTACE) 10 MG capsule Take 1 capsule (10 mg total) by mouth daily. 90 capsule 3  . terazosin (HYTRIN) 10 MG capsule Take 1 capsule (10 mg total) by mouth daily. 90 capsule 3   No facility-administered medications prior to visit.      Per HPI unless specifically indicated in ROS section below Review of Systems  Constitutional: Negative for activity change, appetite change, chills, fatigue, fever and unexpected weight change.  HENT: Negative for hearing loss.   Eyes: Negative for visual disturbance.  Respiratory: Negative for cough, chest tightness, shortness  of breath and wheezing.   Cardiovascular: Negative for chest pain, palpitations and leg swelling.  Gastrointestinal: Negative for abdominal distention, abdominal pain, blood in stool, constipation, diarrhea, nausea and vomiting.  Genitourinary: Negative for difficulty urinating and hematuria.  Musculoskeletal: Negative for arthralgias, myalgias and neck pain.  Skin: Negative for rash.  Neurological: Negative for dizziness, seizures, syncope and headaches.  Hematological: Negative for adenopathy. Does not bruise/bleed easily.    Psychiatric/Behavioral: Negative for dysphoric mood. The patient is not nervous/anxious.        Objective:    BP 120/70   Pulse 82   Ht 5\' 7"  (1.702 m)   Wt 169 lb (76.7 kg)   SpO2 96%   BMI 26.47 kg/m   Wt Readings from Last 3 Encounters:  07/02/17 169 lb (76.7 kg)  06/29/16 176 lb 4 oz (79.9 kg)  12/13/15 179 lb (81.2 kg)    Physical Exam  Constitutional: He is oriented to person, place, and time. He appears well-developed and well-nourished. No distress.  HENT:  Head: Normocephalic and atraumatic.  Right Ear: Hearing, tympanic membrane, external ear and ear canal normal.  Left Ear: Hearing, tympanic membrane, external ear and ear canal normal.  Nose: Nose normal.  Mouth/Throat: Uvula is midline, oropharynx is clear and moist and mucous membranes are normal. No oropharyngeal exudate, posterior oropharyngeal edema or posterior oropharyngeal erythema.  Eyes: Pupils are equal, round, and reactive to light. Conjunctivae and EOM are normal. No scleral icterus.  Neck: Normal range of motion. Neck supple. Carotid bruit is not present. No thyromegaly present.  Cardiovascular: Normal rate, regular rhythm, normal heart sounds and intact distal pulses.   No murmur heard. Pulses:      Radial pulses are 2+ on the right side, and 2+ on the left side.  Pulmonary/Chest: Effort normal and breath sounds normal. No respiratory distress. He has no wheezes. He has no rales.  Abdominal: Soft. Bowel sounds are normal. He exhibits no distension and no mass. There is no tenderness. There is no rebound and no guarding.  Musculoskeletal: Normal range of motion. He exhibits no edema.  No pain midline spine No paraspinous mm tenderness Neg SLR bilaterally. No pain with int/ext rotation at hip. Neg FABER. No pain at SIJ, GTB or sciatic notch bilaterally.   Lymphadenopathy:    He has no cervical adenopathy.  Neurological: He is alert and oriented to person, place, and time.  CN grossly intact,  station and gait intact Recall 3/3 Calculation 5/5 serial 7s  Skin: Skin is warm and dry. No rash noted.  Psychiatric: He has a normal mood and affect. His behavior is normal. Judgment and thought content normal.  Nursing note and vitals reviewed.  Results for orders placed or performed in visit on 06/28/17  VITAMIN D 25 Hydroxy (Vit-D Deficiency, Fractures)  Result Value Ref Range   VITD 31.91 30.00 - 100.00 ng/mL  CBC with Differential/Platelet  Result Value Ref Range   WBC 7.8 4.0 - 10.5 K/uL   RBC 4.43 4.22 - 5.81 Mil/uL   Hemoglobin 13.6 13.0 - 17.0 g/dL   HCT 38.8 (L) 39.0 - 52.0 %   MCV 87.5 78.0 - 100.0 fl   MCHC 35.0 30.0 - 36.0 g/dL   RDW 13.9 11.5 - 15.5 %   Platelets 253.0 150.0 - 400.0 K/uL   Neutrophils Relative % 67.9 43.0 - 77.0 %   Lymphocytes Relative 21.7 12.0 - 46.0 %   Monocytes Relative 7.3 3.0 - 12.0 %   Eosinophils Relative  2.2 0.0 - 5.0 %   Basophils Relative 0.9 0.0 - 3.0 %   Neutro Abs 5.3 1.4 - 7.7 K/uL   Lymphs Abs 1.7 0.7 - 4.0 K/uL   Monocytes Absolute 0.6 0.1 - 1.0 K/uL   Eosinophils Absolute 0.2 0.0 - 0.7 K/uL   Basophils Absolute 0.1 0.0 - 0.1 K/uL  Basic metabolic panel  Result Value Ref Range   Sodium 138 135 - 145 mEq/L   Potassium 3.7 3.5 - 5.1 mEq/L   Chloride 102 96 - 112 mEq/L   CO2 29 19 - 32 mEq/L   Glucose, Bld 98 70 - 99 mg/dL   BUN 19 6 - 23 mg/dL   Creatinine, Ser 1.07 0.40 - 1.50 mg/dL   Calcium 9.7 8.4 - 10.5 mg/dL   GFR 70.73 >60.00 mL/min  Lipid panel  Result Value Ref Range   Cholesterol 189 0 - 200 mg/dL   Triglycerides 125.0 0.0 - 149.0 mg/dL   HDL 46.40 >39.00 mg/dL   VLDL 25.0 0.0 - 40.0 mg/dL   LDL Cholesterol 118 (H) 0 - 99 mg/dL   Total CHOL/HDL Ratio 4    NonHDL 142.88   TSH  Result Value Ref Range   TSH 0.52 0.35 - 4.50 uIU/mL  PSA  Result Value Ref Range   PSA 4.55 (H) 0.10 - 4.00 ng/mL  Vitamin B12  Result Value Ref Range   Vitamin B-12 >1500 (H) 211 - 911 pg/mL  Ferritin  Result Value Ref  Range   Ferritin 97.1 22.0 - 322.0 ng/mL      Assessment & Plan:   Problem List Items Addressed This Visit    Advanced care planning/counseling discussion    Advanced directives: has living will at home. HCPOA would be daughter. Would be ok with CPR and temporary trial of life support if reversible condition. Does not want prolonged life support. Does not want copy in our chart at this time - states daughter will be available.      Bilateral buttock pain    Not consistent with hip OA or sciatica. ?hip and pelvic muscle weakness and dysfunction. Reviewed lateral leg raise exercises and recommended routine to strengthen hip abductors.       BPH with elevated PSA    Will fax latest labs attn Dr Jacqlyn Larsen      Essential hypertension    Chronic, stable. Continue current regimen.       Relevant Medications   terazosin (HYTRIN) 10 MG capsule   ramipril (ALTACE) 10 MG capsule   hydrochlorothiazide (MICROZIDE) 12.5 MG capsule   Health maintenance examination    Preventative protocols reviewed and updated unless pt declined. Discussed healthy diet and lifestyle.       HLD (hyperlipidemia)    Chronic, only on fish oil. Reviewed lipid levels with patient.  The 10-year ASCVD risk score Mikey Bussing DC Brooke Bonito., et al., 2013) is: 32.4%   Values used to calculate the score:     Age: 38 years     Sex: Male     Is Non-Hispanic African American: No     Diabetic: No     Tobacco smoker: No     Systolic Blood Pressure: 063 mmHg     Is BP treated: Yes     HDL Cholesterol: 46.4 mg/dL     Total Cholesterol: 189 mg/dL       Relevant Medications   terazosin (HYTRIN) 10 MG capsule   ramipril (ALTACE) 10 MG capsule   hydrochlorothiazide (MICROZIDE) 12.5 MG capsule  Medicare annual wellness visit, subsequent - Primary    I have personally reviewed the Medicare Annual Wellness questionnaire and have noted 1. The patient's medical and social history 2. Their use of alcohol, tobacco or illicit drugs 3. Their  current medications and supplements 4. The patient's functional ability including ADL's, fall risks, home safety risks and hearing or visual impairment. Cognitive function has been assessed and addressed as indicated.  5. Diet and physical activity 6. Evidence for depression or mood disorders The patients weight, height, BMI have been recorded in the chart. I have made referrals, counseling and provided education to the patient based on review of the above and I have provided the pt with a written personalized care plan for preventive services. Provider list updated.. See scanned questionairre as needed for further documentation. Reviewed preventative protocols and updated unless pt declined.       Seizure disorder (HCC)    Continue dilantin. Will need updated phenytoin levels next labs.           Follow up plan: Return in about 1 year (around 07/02/2018) for annual exam, prior fasting for blood work, medicare wellness visit.  Ria Bush, MD

## 2017-07-02 NOTE — Assessment & Plan Note (Signed)
Chronic, only on fish oil. Reviewed lipid levels with patient.  The 10-year ASCVD risk score Mikey Bussing DC Brooke Bonito., et al., 2013) is: 32.4%   Values used to calculate the score:     Age: 82 years     Sex: Male     Is Non-Hispanic African American: No     Diabetic: No     Tobacco smoker: No     Systolic Blood Pressure: 116 mmHg     Is BP treated: Yes     HDL Cholesterol: 46.4 mg/dL     Total Cholesterol: 189 mg/dL

## 2017-07-02 NOTE — Assessment & Plan Note (Signed)
Preventative protocols reviewed and updated unless pt declined. Discussed healthy diet and lifestyle.  

## 2017-07-02 NOTE — Assessment & Plan Note (Signed)
Chronic, stable. Continue current regimen. 

## 2017-07-02 NOTE — Addendum Note (Signed)
Addended by: Ria Bush on: 07/02/2017 09:19 AM   Modules accepted: Orders

## 2017-07-02 NOTE — Progress Notes (Signed)
Pre visit review using our clinic review tool, if applicable. No additional management support is needed unless otherwise documented below in the visit note. 

## 2017-07-02 NOTE — Assessment & Plan Note (Signed)
Will fax latest labs attn Dr Jacqlyn Larsen

## 2017-07-02 NOTE — Assessment & Plan Note (Signed)
Advanced directives: has living will at home. HCPOA would be daughter. Would be ok with CPR and temporary trial of life support if reversible condition. Does not want prolonged life support. Does not want copy in our chart at this time - states daughter will be available.

## 2017-07-02 NOTE — Assessment & Plan Note (Signed)
Not consistent with hip OA or sciatica. ?hip and pelvic muscle weakness and dysfunction. Reviewed lateral leg raise exercises and recommended routine to strengthen hip abductors.

## 2017-08-05 ENCOUNTER — Other Ambulatory Visit (INDEPENDENT_AMBULATORY_CARE_PROVIDER_SITE_OTHER): Payer: Medicare Other

## 2017-08-05 DIAGNOSIS — Z0000 Encounter for general adult medical examination without abnormal findings: Secondary | ICD-10-CM | POA: Diagnosis not present

## 2017-08-05 LAB — FECAL OCCULT BLOOD, IMMUNOCHEMICAL: Fecal Occult Bld: NEGATIVE

## 2017-08-06 ENCOUNTER — Encounter: Payer: Self-pay | Admitting: *Deleted

## 2017-08-21 HISTORY — PX: TRANSURETHRAL RESECTION OF PROSTATE: SHX73

## 2017-09-07 ENCOUNTER — Encounter: Payer: Self-pay | Admitting: Family Medicine

## 2017-12-06 ENCOUNTER — Other Ambulatory Visit: Payer: Self-pay

## 2017-12-06 MED ORDER — PHENYTOIN SODIUM EXTENDED 100 MG PO CAPS: 200.0000 mg | ORAL_CAPSULE | Freq: Two times a day (BID) | ORAL | 1 refills | Status: DC

## 2017-12-06 NOTE — Telephone Encounter (Signed)
[facility]; [facility] at [facility] and [facility]/17 rx was transferred to [facility] [address]. Pt no longer wants to get at [facility]; I spoke with April at [facility] [address] and she cancelled refills for dilantin. Will send # [phone] to [facility]. Pt appreciative and voiced understanding.

## 2017-12-07 ENCOUNTER — Other Ambulatory Visit: Payer: Self-pay

## 2018-01-26 ENCOUNTER — Ambulatory Visit: Payer: Medicare Other | Admitting: Family Medicine

## 2018-01-26 ENCOUNTER — Encounter: Payer: Self-pay | Admitting: Family Medicine

## 2018-01-26 VITALS — BP 122/64 | HR 67 | Temp 97.9°F | Wt 174.0 lb

## 2018-01-26 DIAGNOSIS — G629 Polyneuropathy, unspecified: Secondary | ICD-10-CM | POA: Insufficient documentation

## 2018-01-26 DIAGNOSIS — G6289 Other specified polyneuropathies: Secondary | ICD-10-CM

## 2018-01-26 LAB — COMPREHENSIVE METABOLIC PANEL
ALBUMIN: 4.2 g/dL (ref 3.5–5.2)
ALT: 18 U/L (ref 0–53)
AST: 19 U/L (ref 0–37)
Alkaline Phosphatase: 94 U/L (ref 39–117)
BILIRUBIN TOTAL: 0.3 mg/dL (ref 0.2–1.2)
BUN: 21 mg/dL (ref 6–23)
CALCIUM: 9.4 mg/dL (ref 8.4–10.5)
CO2: 32 meq/L (ref 19–32)
CREATININE: 1.08 mg/dL (ref 0.40–1.50)
Chloride: 102 mEq/L (ref 96–112)
GFR: 69.88 mL/min (ref >60.00)
Glucose, Bld: 101 mg/dL — ABNORMAL HIGH (ref 70–99)
Potassium: 4.3 mEq/L (ref 3.5–5.1)
SODIUM: 140 meq/L (ref 135–145)
Total Protein: 7 g/dL (ref 6.0–8.3)

## 2018-01-26 LAB — CBC WITH DIFFERENTIAL/PLATELET
Basophils Absolute: 0.1 10*3/uL (ref 0.0–0.1)
Basophils Relative: 0.7 % (ref 0.0–3.0)
EOS PCT: 1.4 % (ref 0.0–5.0)
Eosinophils Absolute: 0.1 10*3/uL (ref 0.0–0.7)
HCT: 41.2 % (ref 39.0–52.0)
Hemoglobin: 14.1 g/dL (ref 13.0–17.0)
LYMPHS ABS: 1.5 10*3/uL (ref 0.7–4.0)
Lymphocytes Relative: 17.4 % (ref 12.0–46.0)
MCHC: 34.3 g/dL (ref 30.0–36.0)
MCV: 88.4 fl (ref 78.0–100.0)
MONO ABS: 0.7 10*3/uL (ref 0.1–1.0)
Monocytes Relative: 8 % (ref 3.0–12.0)
NEUTROS ABS: 6.1 10*3/uL (ref 1.4–7.7)
NEUTROS PCT: 72.5 % (ref 43.0–77.0)
PLATELETS: 268 10*3/uL (ref 150.0–400.0)
RBC: 4.65 Mil/uL (ref 4.22–5.81)
RDW: 13.4 % (ref 11.5–15.5)
WBC: 8.4 10*3/uL (ref 4.0–10.5)

## 2018-01-26 LAB — VITAMIN B12: Vitamin B-12: 1384 pg/mL — ABNORMAL HIGH (ref 211–911)

## 2018-01-26 LAB — TSH: TSH: 0.65 u[IU]/mL (ref 0.35–4.50)

## 2018-01-26 NOTE — Assessment & Plan Note (Signed)
Progressive R>L upper extremities, denies significant trouble with lower extremity.  Not consistent with CTS. ?cervical etiology.  Check labs, refer for NCS. Further plan pending results.  Pt agrees with plan. For now, continue B12, B6.

## 2018-01-26 NOTE — Progress Notes (Signed)
BP 122/64 (BP Location: Left Arm, Patient Position: Sitting, Cuff Size: Normal)   Pulse 67   Temp 97.9 F (36.6 C) (Oral)   Wt 174 lb (78.9 kg)   SpO2 96%   BMI 27.25 kg/m    CC: numbness Subjective:    Patient ID: Mark Holmes, male    DOB: October 06, 1935, 83 y.o.   MRN: [id number]  HPI: Mark Holmes is a 82 y.o. male presenting on 01/26/2018 for Numbness (Right hand numbness. Started 6-8 mos ago, gradually worsening. Now has some numbness in left hand. 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   Over the past month noticing worsening R hand numbness starting at thumb and progressing throughout entire hand, now L hand numbness is developing. "grainy" sensation with paresthesia. Endorses some heaviness in arms as well - trouble raising arms past 90 degrees. Mild neck ache but no radiculopathy. Also noticing some L foot paresthesias. Chronic R foot numbness in h/o lower back pain. Some dizziness upon standing as well.   Having trouble buttoning shirt with hands.  He regularly takes B12 and B6 vitamins.   Denies fevers/chills, abdominal pain, nausea, chest pain, shortness of breath. No fatigue with upward gaze. No slurred speech or dysarthria.   No alcohol.   Recent successful TURP.   Relevant past medical, surgical, family and social history reviewed and updated as indicated. Interim medical history since our last visit reviewed. Allergies and medications reviewed and updated. Outpatient Medications Prior to Visit  Medication Sig Dispense Refill  . aspirin-acetaminophen-caffeine (EXCEDRIN MIGRAINE) 250-250-65 MG per tablet Take 1 tablet by mouth 2 (two) times daily as needed.     . Cholecalciferol (VITAMIN D3) 1000 UNITS CAPS Take by mouth daily.      . Cyanocobalamin (B-12) 500 MCG TABS Take by mouth daily.      Marland Kitchen dutasteride (AVODART) 0.5 MG capsule Take 0.5 mg by mouth as directed. Every 3 days    . ferrous fumarate  (HEMOCYTE - 106 MG FE) 325 (106 FE) MG TABS tablet Take 1 tablet by mouth daily.    . fish oil-omega-3 fatty acids 1000 MG capsule Take 2 g by mouth daily. As directed     . hydrochlorothiazide (MICROZIDE) 12.5 MG capsule TAKE ONE CAPSULE BY MOUTH EVERY MORNING 90 capsule 3  . nitroGLYCERIN (NITROSTAT) 0.4 MG SL tablet Place 1 tablet (0.4 mg total) under the tongue every 5 (five) minutes as needed for chest pain. 50 tablet 3  . phenytoin (DILANTIN) 100 MG ER capsule Take 2 capsules (200 mg total) by mouth 2 (two) times daily. 360 capsule 1  . Pyridoxine HCl (VITAMIN B-6 PO) Take 100 mg by mouth daily.    . ramipril (ALTACE) 10 MG capsule Take 1 capsule (10 mg total) by mouth daily. 90 capsule 3  . terazosin (HYTRIN) 10 MG capsule Take 1 capsule (10 mg total) by mouth daily. 90 capsule 3   No facility-administered medications prior to visit.      Per HPI unless specifically indicated in ROS section below Review of Systems     Objective:    BP 122/64 (BP Location: Left Arm, Patient Position: Sitting, Cuff Size: Normal)   Pulse 67   Temp 97.9 F (36.6 C) (Oral)   Wt 174 lb (78.9 kg)   SpO2 96%   BMI 27.25 kg/m   Wt Readings from Last 3 Encounters:  01/26/18 174 lb (78.9 kg)  07/02/17 169  lb (76.7 kg)  06/29/16 176 lb 4 oz (79.9 kg)    Physical Exam  Constitutional: He is oriented to person, place, and time. He appears well-developed and well-nourished. No distress.  Musculoskeletal: He exhibits no edema.  FROM at neck No midline cervical tenderness  Neurological: He is alert and oriented to person, place, and time. He has normal strength. No sensory deficit. Coordination and gait normal.  Reflex Scores:      Bicep reflexes are 2+ on the right side and 2+ on the left side.      Brachioradialis reflexes are 2+ on the right side and 2+ on the left side. Neg phalen and tinel Neg spurling Grip strength intact 5/5 strength BUE  Skin: Skin is warm and dry. No rash noted. No  erythema.  Nursing note and vitals reviewed.  Lab Results  Component Value Date   CREATININE 1.07 06/28/2017   BUN 19 06/28/2017   NA 138 06/28/2017   K 3.7 06/28/2017   CL 102 06/28/2017   CO2 29 06/28/2017    Lab Results  Component Value Date   WBC 7.8 06/28/2017   HGB 13.6 06/28/2017   HCT 38.8 (L) 06/28/2017   MCV 87.5 06/28/2017   PLT 253.0 06/28/2017       Assessment & Plan:   Problem List Items Addressed This Visit    Peripheral neuropathy - Primary    Progressive R>L upper extremities, denies significant trouble with lower extremity.  Not consistent with CTS. ?cervical etiology.  Check labs, refer for NCS. Further plan pending results.  Pt agrees with plan. For now, continue B12, B6.       Relevant Orders   Comprehensive metabolic panel   TSH   Vitamin B12   Nerve conduction test   CBC with Differential/Platelet       Follow up plan: No Follow-up on file.  Mark Bush, MD

## 2018-01-26 NOTE — Patient Instructions (Addendum)
For progressive neuropathy let's check labs and refer you for nerve conduction study.  Continue medicines for now.

## 2018-01-26 NOTE — Addendum Note (Signed)
Addended by: Ria Bush on: 01/26/2018 11:17 AM   Modules accepted: Orders

## 2018-02-02 DIAGNOSIS — G5601 Carpal tunnel syndrome, right upper limb: Secondary | ICD-10-CM | POA: Insufficient documentation

## 2018-02-02 DIAGNOSIS — G5622 Lesion of ulnar nerve, left upper limb: Secondary | ICD-10-CM | POA: Insufficient documentation

## 2018-05-16 ENCOUNTER — Encounter: Payer: Self-pay | Admitting: Family Medicine

## 2018-05-19 ENCOUNTER — Telehealth: Payer: Self-pay

## 2018-05-19 NOTE — Telephone Encounter (Signed)
Spoke with pt informing him that [facility] is requesting his medical info.  So we need him to come by the office to sign a medical release form giving [place] permission to fax requested info. Says he will come by tomorrow morning.

## 2018-05-19 NOTE — Telephone Encounter (Signed)
Copied from [place] [id number]. Topic: Quick Communication - See Telephone Encounter >> May 19, 2018 10:15 AM Antonieta Iba C wrote: CRM for notification. See Telephone encounter for: 05/19/18.  Adonis Brook called in [facility]. They are seeing pt next week and would like to have pt's most recent ov notes, labs, EKG    (fax) [phone]   (phone) [phone]

## 2018-05-20 ENCOUNTER — Telehealth: Payer: Self-pay | Admitting: Family Medicine

## 2018-05-20 NOTE — Telephone Encounter (Signed)
Patient wanted to let Dr. Darnell Level know he'll be having surgery done on 05/24/18 at [facility].  Patient said they are doing an ACDF on his C3 and C4.  Patient said he's been having numbness in his hands and weakness.

## 2018-05-20 NOTE — Telephone Encounter (Signed)
Noted  

## 2018-05-20 NOTE — Telephone Encounter (Signed)
Copied from [place] [id number]. Topic: Quick Communication - See Telephone Encounter >> May 19, 2018 10:15 AM Antonieta Iba C wrote: CRM for notification. See Telephone encounter for: 05/19/18.  Adonis Brook called in [facility]. They are seeing pt next week and would like to have pt's most recent ov notes, labs, EKG    (fax) [phone]   (phone) [phone]  >> May 20, 2018  9:04 AM Yvette Rack wrote: Hassan Rowan from [facility]. Calling back to get pt recent labs OV EKG CPE pt has surgery on 05-24-18   (F) [phone]   (P) [phone] need this faxed over today

## 2018-05-20 NOTE — Telephone Encounter (Signed)
Patient came by and signed records release form.  01/26/18 office note,lab report and EKG faxed to [facility].

## 2018-05-21 HISTORY — PX: ANTERIOR CERVICAL DECOMP/DISCECTOMY FUSION: SHX1161

## 2018-05-30 ENCOUNTER — Encounter: Payer: Self-pay | Admitting: Family Medicine

## 2018-05-30 NOTE — Telephone Encounter (Signed)
noted 

## 2018-05-31 ENCOUNTER — Other Ambulatory Visit: Payer: Self-pay | Admitting: Family Medicine

## 2018-05-31 NOTE — Telephone Encounter (Signed)
Copied from [place] [phone]. Topic: Quick Communication - Rx Refill/Question >> May 31, 2018  3:01 PM Carolyn Stare wrote: Medication phenytoin (DILANTIN) 100 MG ER capsule     Pt has no more refills    Has the patient contacted their pharmacy yes    Preferred Pharmacy  [facility]   Agent: Please be advised that RX refills may take up to 3 business days. We ask that you follow-up with your pharmacy.

## 2018-06-01 NOTE — Telephone Encounter (Signed)
Rx refill request: Dilantin 100 mg ER    Last filled: 12/06/17 1 RF  LOV: 01/26/18- next visit- 07/04/18  PCP: [place]: [facility]

## 2018-06-03 MED ORDER — PHENYTOIN SODIUM EXTENDED 100 MG PO CAPS: 200.0000 mg | ORAL_CAPSULE | Freq: Two times a day (BID) | ORAL | 1 refills | Status: DC

## 2018-06-03 NOTE — Telephone Encounter (Signed)
Patient called and states that he is about to run out of his medication soon and would like for Dr. Danise Mina to send the RX phenytoin (DILANTIN) 100 MG ER capsule to [facility] .

## 2018-06-03 NOTE — Telephone Encounter (Signed)
Spoke with pt notifying him Dr. Darnell Level sent a refill to [facility].  States he does not need rx sent to local pharmacy.  Expresses his thanks.

## 2018-06-03 NOTE — Telephone Encounter (Signed)
plz notify this was sent in. Check to see if we need to send in short course to local pharmacy. Thanks.

## 2018-06-12 ENCOUNTER — Encounter: Payer: Self-pay | Admitting: Family Medicine

## 2018-06-30 ENCOUNTER — Other Ambulatory Visit: Payer: Self-pay | Admitting: Family Medicine

## 2018-06-30 DIAGNOSIS — N400 Enlarged prostate without lower urinary tract symptoms: Secondary | ICD-10-CM

## 2018-06-30 DIAGNOSIS — R972 Elevated prostate specific antigen [PSA]: Secondary | ICD-10-CM

## 2018-06-30 DIAGNOSIS — G6289 Other specified polyneuropathies: Secondary | ICD-10-CM

## 2018-06-30 DIAGNOSIS — G40909 Epilepsy, unspecified, not intractable, without status epilepticus: Secondary | ICD-10-CM

## 2018-06-30 DIAGNOSIS — E559 Vitamin D deficiency, unspecified: Secondary | ICD-10-CM

## 2018-06-30 DIAGNOSIS — E785 Hyperlipidemia, unspecified: Secondary | ICD-10-CM

## 2018-06-30 DIAGNOSIS — E041 Nontoxic single thyroid nodule: Secondary | ICD-10-CM

## 2018-07-01 ENCOUNTER — Other Ambulatory Visit (INDEPENDENT_AMBULATORY_CARE_PROVIDER_SITE_OTHER): Payer: Medicare Other

## 2018-07-01 DIAGNOSIS — R972 Elevated prostate specific antigen [PSA]: Secondary | ICD-10-CM | POA: Diagnosis not present

## 2018-07-01 DIAGNOSIS — E559 Vitamin D deficiency, unspecified: Secondary | ICD-10-CM | POA: Diagnosis not present

## 2018-07-01 DIAGNOSIS — N400 Enlarged prostate without lower urinary tract symptoms: Secondary | ICD-10-CM | POA: Diagnosis not present

## 2018-07-01 DIAGNOSIS — G6289 Other specified polyneuropathies: Secondary | ICD-10-CM

## 2018-07-01 DIAGNOSIS — E041 Nontoxic single thyroid nodule: Secondary | ICD-10-CM | POA: Diagnosis not present

## 2018-07-01 DIAGNOSIS — G40909 Epilepsy, unspecified, not intractable, without status epilepticus: Secondary | ICD-10-CM | POA: Diagnosis not present

## 2018-07-01 LAB — LIPID PANEL
Cholesterol: 200 mg/dL (ref 0–200)
HDL: 45.3 mg/dL (ref >39.00)
LDL CALC: 122 mg/dL — AB (ref 0–99)
NONHDL: 155.17
Total CHOL/HDL Ratio: 4
Triglycerides: 166 mg/dL — ABNORMAL HIGH (ref 0.0–149.0)
VLDL: 33.2 mg/dL (ref 0.0–40.0)

## 2018-07-01 LAB — COMPREHENSIVE METABOLIC PANEL
ALT: 18 U/L (ref 0–53)
AST: 17 U/L (ref 0–37)
Albumin: 4.2 g/dL (ref 3.5–5.2)
Alkaline Phosphatase: 98 U/L (ref 39–117)
BUN: 23 mg/dL (ref 6–23)
CO2: 29 meq/L (ref 19–32)
Calcium: 9.4 mg/dL (ref 8.4–10.5)
Chloride: 103 mEq/L (ref 96–112)
Creatinine, Ser: 1.01 mg/dL (ref 0.40–1.50)
GFR: 75.41 mL/min (ref >60.00)
GLUCOSE: 106 mg/dL — AB (ref 70–99)
POTASSIUM: 3.9 meq/L (ref 3.5–5.1)
SODIUM: 141 meq/L (ref 135–145)
Total Bilirubin: 0.4 mg/dL (ref 0.2–1.2)
Total Protein: 6.6 g/dL (ref 6.0–8.3)

## 2018-07-01 LAB — CBC WITH DIFFERENTIAL/PLATELET
BASOS ABS: 0.1 10*3/uL (ref 0.0–0.1)
Basophils Relative: 0.7 % (ref 0.0–3.0)
EOS ABS: 0.2 10*3/uL (ref 0.0–0.7)
Eosinophils Relative: 2.2 % (ref 0.0–5.0)
HCT: 38.8 % — ABNORMAL LOW (ref 39.0–52.0)
Hemoglobin: 13.5 g/dL (ref 13.0–17.0)
LYMPHS PCT: 23.1 % (ref 12.0–46.0)
Lymphs Abs: 1.6 10*3/uL (ref 0.7–4.0)
MCHC: 34.8 g/dL (ref 30.0–36.0)
MCV: 87.1 fl (ref 78.0–100.0)
Monocytes Absolute: 0.6 10*3/uL (ref 0.1–1.0)
Monocytes Relative: 8.4 % (ref 3.0–12.0)
NEUTROS ABS: 4.6 10*3/uL (ref 1.4–7.7)
NEUTROS PCT: 65.6 % (ref 43.0–77.0)
PLATELETS: 246 10*3/uL (ref 150.0–400.0)
RBC: 4.45 Mil/uL (ref 4.22–5.81)
RDW: 13.6 % (ref 11.5–15.5)
WBC: 7 10*3/uL (ref 4.0–10.5)

## 2018-07-01 LAB — TSH: TSH: 0.49 u[IU]/mL (ref 0.35–4.50)

## 2018-07-01 LAB — VITAMIN B12: Vitamin B-12: 1211 pg/mL — ABNORMAL HIGH (ref 211–911)

## 2018-07-01 LAB — PSA: PSA: 0.81 ng/mL (ref 0.10–4.00)

## 2018-07-01 LAB — VITAMIN D 25 HYDROXY (VIT D DEFICIENCY, FRACTURES): VITD: 12.24 ng/mL — AB (ref 30.00–100.00)

## 2018-07-04 ENCOUNTER — Ambulatory Visit (INDEPENDENT_AMBULATORY_CARE_PROVIDER_SITE_OTHER): Payer: Medicare Other | Admitting: Family Medicine

## 2018-07-04 ENCOUNTER — Encounter: Payer: Self-pay | Admitting: Family Medicine

## 2018-07-04 VITALS — BP 130/78 | HR 76 | Temp 98.1°F | Ht 67.0 in | Wt 171.5 lb

## 2018-07-04 DIAGNOSIS — R011 Cardiac murmur, unspecified: Secondary | ICD-10-CM | POA: Insufficient documentation

## 2018-07-04 DIAGNOSIS — Z0000 Encounter for general adult medical examination without abnormal findings: Secondary | ICD-10-CM

## 2018-07-04 DIAGNOSIS — G40909 Epilepsy, unspecified, not intractable, without status epilepticus: Secondary | ICD-10-CM

## 2018-07-04 DIAGNOSIS — M4722 Other spondylosis with radiculopathy, cervical region: Secondary | ICD-10-CM

## 2018-07-04 DIAGNOSIS — I10 Essential (primary) hypertension: Secondary | ICD-10-CM

## 2018-07-04 DIAGNOSIS — E559 Vitamin D deficiency, unspecified: Secondary | ICD-10-CM

## 2018-07-04 DIAGNOSIS — R972 Elevated prostate specific antigen [PSA]: Secondary | ICD-10-CM

## 2018-07-04 DIAGNOSIS — G8929 Other chronic pain: Secondary | ICD-10-CM

## 2018-07-04 DIAGNOSIS — E785 Hyperlipidemia, unspecified: Secondary | ICD-10-CM

## 2018-07-04 DIAGNOSIS — N400 Enlarged prostate without lower urinary tract symptoms: Secondary | ICD-10-CM

## 2018-07-04 DIAGNOSIS — M25571 Pain in right ankle and joints of right foot: Secondary | ICD-10-CM

## 2018-07-04 DIAGNOSIS — Z7189 Other specified counseling: Secondary | ICD-10-CM

## 2018-07-04 DIAGNOSIS — M4712 Other spondylosis with myelopathy, cervical region: Secondary | ICD-10-CM

## 2018-07-04 LAB — PHENYTOIN LEVEL, FREE: Phenytoin, Free: 0.8 ug/mL — ABNORMAL LOW (ref 1.0–2.0)

## 2018-07-04 MED ORDER — RAMIPRIL 10 MG PO CAPS: 10.0000 mg | ORAL_CAPSULE | Freq: Every day | ORAL | 3 refills | Status: DC

## 2018-07-04 MED ORDER — PHENYTOIN SODIUM EXTENDED 100 MG PO CAPS: 200.0000 mg | ORAL_CAPSULE | Freq: Two times a day (BID) | ORAL | 3 refills | Status: DC

## 2018-07-04 MED ORDER — B-12 500 MCG PO TABS: 1.0000 | ORAL_TABLET | ORAL | Status: DC

## 2018-07-04 MED ORDER — HYDROCHLOROTHIAZIDE 12.5 MG PO CAPS: ORAL_CAPSULE | ORAL | 3 refills | Status: DC

## 2018-07-04 MED ORDER — TERAZOSIN HCL 10 MG PO CAPS: 10.0000 mg | ORAL_CAPSULE | Freq: Every day | ORAL | 3 refills | Status: DC

## 2018-07-04 NOTE — Assessment & Plan Note (Signed)
Low off med - encouraged restart vit D 1000 IU daily.

## 2018-07-04 NOTE — Assessment & Plan Note (Signed)
Mild heard today, asxs. Will watch for now.

## 2018-07-04 NOTE — Assessment & Plan Note (Signed)
Chronic, stable off statin. Only on fish oil.  The ASCVD Risk score Mikey Bussing DC Jr., et al., 2013) failed to calculate for the following reasons:   The 2013 ASCVD risk score is only valid for ages 109 to 30

## 2018-07-04 NOTE — Assessment & Plan Note (Addendum)
Chronic, stable. Continue hctz and ramipril.

## 2018-07-04 NOTE — Assessment & Plan Note (Signed)
Seizure free on dilantin.

## 2018-07-04 NOTE — Assessment & Plan Note (Signed)
Preventative protocols reviewed and updated unless pt declined. Discussed healthy diet and lifestyle.  

## 2018-07-04 NOTE — Progress Notes (Signed)
BP 130/78 (BP Location: Right Arm, Patient Position: Sitting, Cuff Size: Normal)   Pulse 76   Temp 98.1 F (36.7 C) (Oral)   Ht 5\' 7"  (1.702 m)   Wt 171 lb 8 oz (77.8 kg)   SpO2 96%   BMI 26.86 kg/m    CC: AMW Subjective:    Patient ID: Mark Holmes, male    DOB: 1935/11/12, 83 y.o.   MRN: [id number]  HPI: Mark Holmes is a 83 y.o. male presenting on 07/04/2018 for Medicare Wellness (Requests printed rx for Dilantan. Also, requests rx for new left ankle brace. )   Passed fall screen Depression screen passed  Hearing Screening   125Hz  250Hz  500Hz  1000Hz  2000Hz  3000Hz  4000Hz  6000Hz  8000Hz   Right ear:   40 40 40  0    Left ear:   25 25 40  0      Visual Acuity Screening   Right eye Left eye Both eyes  Without correction:     With correction: 20/50 20/25 20/30   eyes watering.   Worsening periph neuropathy - last visit we referred for NCS - this was completed through [facility]. Saw ortho instead - s/p ACDF C3/4 05/24/2018 by Mark Holmes. Ongoing hand paresthesias but better ROM at arms/shoulder, and better balance. Wrist brace at night hasn't really helped. R>L symptoms.   Requests new air cast brace.   Preventative: Prostate - has seen uro (Mark. Jacqlyn Holmes at [facility] who has left) - would like to establish locally (Mark Holmes). H/o BPH s/p TURP on avodart and terazosin. Does get PSA checked by them. Requests referral today.  Colon cancer screening - has not had colonoscopy before. Reassuring iFOB previously. Decides to age out.  Flu shot - does not receive. Pneumovax 2011, prevnar 2015. Tetanus shot Td 2011.  Shingrix - h/o shingles. Declines.  Advanced directives: has living will at home. HCPOA would be daughter. Would be ok with CPR and temporary trial of life support if reversible condition. Does not want prolonged life support. Does not want copy in our chart at this time - states daughter will be available. Seat belt use discussed.  Sunscreen use discussed.  No changing moles.  Non smoker Alcohol - rare Dentist - yearly Eye exam - yearly  Caffeine: 1 cup/day, 2 excedrin/day  Married since 1959  2 daughters  Occupation: retired, was Banker)  Activity: walking dog, stays active with grandson (swimming)  Diet: avoids carbs, good water, fruits/vegetables daily   Relevant past medical, surgical, family and social history reviewed and updated as indicated. Interim medical history since our last visit reviewed. Allergies and medications reviewed and updated. Outpatient Medications Prior to Visit  Medication Sig Dispense Refill  . aspirin-acetaminophen-caffeine (EXCEDRIN MIGRAINE) 250-250-65 MG per tablet Take 1 tablet by mouth 2 (two) times daily as needed.     . Cholecalciferol (VITAMIN D3) 1000 UNITS CAPS Take by mouth daily.      Marland Kitchen dutasteride (AVODART) 0.5 MG capsule Take 0.5 mg by mouth as directed. Every 3 days    . fish oil-omega-3 fatty acids 1000 MG capsule Take 2 g by mouth daily. As directed     . nitroGLYCERIN (NITROSTAT) 0.4 MG SL tablet Place 1 tablet (0.4 mg total) under the tongue every 5 (five) minutes as needed for chest pain. 50 tablet 3  . Pyridoxine HCl (VITAMIN B-6 PO) Take 100 mg by mouth daily.    . Cyanocobalamin (B-12) 500 MCG TABS Take by mouth daily.      Marland Kitchen  hydrochlorothiazide (MICROZIDE) 12.5 MG capsule TAKE ONE CAPSULE BY MOUTH EVERY MORNING 90 capsule 3  . phenytoin (DILANTIN) 100 MG ER capsule Take 2 capsules (200 mg total) by mouth 2 (two) times daily. 360 capsule 1  . ramipril (ALTACE) 10 MG capsule Take 1 capsule (10 mg total) by mouth daily. 90 capsule 3  . terazosin (HYTRIN) 10 MG capsule Take 1 capsule (10 mg total) by mouth daily. 90 capsule 3  . ferrous fumarate (HEMOCYTE - 106 MG FE) 325 (106 FE) MG TABS tablet Take 1 tablet by mouth daily.     No facility-administered medications prior to visit.      Per HPI unless specifically indicated in ROS section below Review of Systems    Constitutional: Negative for activity change, appetite change, chills, fatigue, fever and unexpected weight change.  HENT: Negative for hearing loss.   Eyes: Negative for visual disturbance.  Respiratory: Negative for cough, chest tightness, shortness of breath and wheezing.   Cardiovascular: Negative for chest pain, palpitations and leg swelling.  Gastrointestinal: Negative for abdominal distention, abdominal pain, blood in stool, constipation, diarrhea, nausea and vomiting.  Genitourinary: Negative for difficulty urinating and hematuria.  Musculoskeletal: Negative for arthralgias, myalgias and neck pain.  Skin: Negative for rash.  Neurological: Negative for dizziness, seizures, syncope and headaches.  Hematological: Negative for adenopathy. Does not bruise/bleed easily.  Psychiatric/Behavioral: Negative for dysphoric mood. The patient is not nervous/anxious.        Objective:    BP 130/78 (BP Location: Right Arm, Patient Position: Sitting, Cuff Size: Normal)   Pulse 76   Temp 98.1 F (36.7 C) (Oral)   Ht 5\' 7"  (1.702 m)   Wt 171 lb 8 oz (77.8 kg)   SpO2 96%   BMI 26.86 kg/m   Wt Readings from Last 3 Encounters:  07/04/18 171 lb 8 oz (77.8 kg)  01/26/18 174 lb (78.9 kg)  07/02/17 169 lb (76.7 kg)    Physical Exam  Constitutional: He is oriented to person, place, and time. He appears well-developed and well-nourished. No distress.  HENT:  Head: Normocephalic and atraumatic.  Right Ear: Hearing, tympanic membrane, external ear and ear canal normal.  Left Ear: Hearing, tympanic membrane, external ear and ear canal normal.  Nose: Nose normal.  Mouth/Throat: Uvula is midline, oropharynx is clear and moist and mucous membranes are normal. No oropharyngeal exudate, posterior oropharyngeal edema or posterior oropharyngeal erythema.  Eyes: Pupils are equal, round, and reactive to light. Conjunctivae and EOM are normal. No scleral icterus.  Neck: Normal range of motion. Neck supple.  Carotid bruit is not present. No thyromegaly present.  Cardiovascular: Normal rate, regular rhythm and intact distal pulses.  Murmur (2/6 systolic RUSB) heard. Pulses:      Radial pulses are 2+ on the right side, and 2+ on the left side.  Pulmonary/Chest: Effort normal and breath sounds normal. No respiratory distress. He has no wheezes. He has no rales.  Abdominal: Soft. Bowel sounds are normal. He exhibits no distension and no mass. There is no tenderness. There is no rebound and no guarding.  Musculoskeletal: Normal range of motion. He exhibits no edema.  Lymphadenopathy:    He has no cervical adenopathy.  Neurological: He is alert and oriented to person, place, and time.  CN grossly intact, station and gait intact Recall 3/3 Calculation 5/5 serial 3s  Skin: Skin is warm and dry. No rash noted.  Psychiatric: He has a normal mood and affect. His behavior is normal. Judgment and  thought content normal.  Nursing note and vitals reviewed.  Results for orders placed or performed in visit on 07/01/18  VITAMIN D 25 Hydroxy (Vit-D Deficiency, Fractures)  Result Value Ref Range   VITD 12.24 (L) 30.00 - 100.00 ng/mL  Vitamin B12  Result Value Ref Range   Vitamin B-12 1,211 (H) 211 - 911 pg/mL  CBC with Differential/Platelet  Result Value Ref Range   WBC 7.0 4.0 - 10.5 K/uL   RBC 4.45 4.22 - 5.81 Mil/uL   Hemoglobin 13.5 13.0 - 17.0 g/dL   HCT 38.8 (L) 39.0 - 52.0 %   MCV 87.1 78.0 - 100.0 fl   MCHC 34.8 30.0 - 36.0 g/dL   RDW 13.6 11.5 - 15.5 %   Platelets 246.0 150.0 - 400.0 K/uL   Neutrophils Relative % 65.6 43.0 - 77.0 %   Lymphocytes Relative 23.1 12.0 - 46.0 %   Monocytes Relative 8.4 3.0 - 12.0 %   Eosinophils Relative 2.2 0.0 - 5.0 %   Basophils Relative 0.7 0.0 - 3.0 %   Neutro Abs 4.6 1.4 - 7.7 K/uL   Lymphs Abs 1.6 0.7 - 4.0 K/uL   Monocytes Absolute 0.6 0.1 - 1.0 K/uL   Eosinophils Absolute 0.2 0.0 - 0.7 K/uL   Basophils Absolute 0.1 0.0 - 0.1 K/uL  PSA  Result Value  Ref Range   PSA 0.81 0.10 - 4.00 ng/mL  TSH  Result Value Ref Range   TSH 0.49 0.35 - 4.50 uIU/mL  Comprehensive metabolic panel  Result Value Ref Range   Sodium 141 135 - 145 mEq/L   Potassium 3.9 3.5 - 5.1 mEq/L   Chloride 103 96 - 112 mEq/L   CO2 29 19 - 32 mEq/L   Glucose, Bld 106 (H) 70 - 99 mg/dL   BUN 23 6 - 23 mg/dL   Creatinine, Ser 1.01 0.40 - 1.50 mg/dL   Total Bilirubin 0.4 0.2 - 1.2 mg/dL   Alkaline Phosphatase 98 39 - 117 U/L   AST 17 0 - 37 U/L   ALT 18 0 - 53 U/L   Total Protein 6.6 6.0 - 8.3 g/dL   Albumin 4.2 3.5 - 5.2 g/dL   Calcium 9.4 8.4 - 10.5 mg/dL   GFR 75.41 >60.00 mL/min  Lipid panel  Result Value Ref Range   Cholesterol 200 0 - 200 mg/dL   Triglycerides 166.0 (H) 0.0 - 149.0 mg/dL   HDL 45.30 >39.00 mg/dL   VLDL 33.2 0.0 - 40.0 mg/dL   LDL Cholesterol 122 (H) 0 - 99 mg/dL   Total CHOL/HDL Ratio 4    NonHDL 155.17       Assessment & Plan:   Problem List Items Addressed This Visit    Vitamin D deficiency    Low off med - encouraged restart vit D 1000 IU daily.       Systolic murmur    Mild heard today, asxs. Will watch for now.       Seizure disorder ([place])    Seizure free on dilantin.       Relevant Medications   phenytoin (DILANTIN) 100 MG ER capsule   Medicare annual wellness visit, subsequent - Primary    I have personally reviewed the Medicare Annual Wellness questionnaire and have noted 1. The patient's medical and social history 2. Their use of alcohol, tobacco or illicit drugs 3. Their current medications and supplements 4. The patient's functional ability including ADL's, fall risks, home safety risks and hearing or visual impairment.  Cognitive function has been assessed and addressed as indicated.  5. Diet and physical activity 6. Evidence for depression or mood disorders The patients weight, height, BMI have been recorded in the chart. I have made referrals, counseling and provided education to the patient based on review  of the above and I have provided the pt with a written personalized care plan for preventive services. Provider list updated.. See scanned questionairre as needed for further documentation. Reviewed preventative protocols and updated unless pt declined.       HLD (hyperlipidemia)    Chronic, stable off statin. Only on fish oil.  The ASCVD Risk score Mikey Bussing DC Jr., et al., 2013) failed to calculate for the following reasons:   The 2013 ASCVD risk score is only valid for ages 36 to 40       Relevant Medications   ramipril (ALTACE) 10 MG capsule   terazosin (HYTRIN) 10 MG capsule   hydrochlorothiazide (MICROZIDE) 12.5 MG capsule   Health maintenance examination    Preventative protocols reviewed and updated unless pt declined. Discussed healthy diet and lifestyle.       Essential hypertension    Chronic, stable. Continue hctz and ramipril.       Relevant Medications   ramipril (ALTACE) 10 MG capsule   terazosin (HYTRIN) 10 MG capsule   hydrochlorothiazide (MICROZIDE) 12.5 MG capsule   Chronic ankle pain    Ongoing discomfort after fall off cherry tree - air cast brace very helpful - requests new cast, provided today.       Cervical spondylosis with myelopathy and radiculopathy    S/p ACDF, continues f/u with ortho. Appreciate their care.       BPH with elevated PSA    S/p TURP 08/2017 Will refer to local urology per pt request.       Relevant Orders   Ambulatory referral to Urology   Advanced care planning/counseling discussion    Advanced directives: has living will at home. HCPOA would be daughter. Would be ok with CPR and temporary trial of life support if reversible condition. Does not want prolonged life support.           Meds ordered this encounter  Medications  . ramipril (ALTACE) 10 MG capsule    Sig: Take 1 capsule (10 mg total) by mouth daily.    Dispense:  90 capsule    Refill:  3  . terazosin (HYTRIN) 10 MG capsule    Sig: Take 1 capsule (10 mg total)  by mouth daily.    Dispense:  90 capsule    Refill:  3  . hydrochlorothiazide (MICROZIDE) 12.5 MG capsule    Sig: TAKE ONE CAPSULE BY MOUTH EVERY MORNING    Dispense:  90 capsule    Refill:  3  . phenytoin (DILANTIN) 100 MG ER capsule    Sig: Take 2 capsules (200 mg total) by mouth 2 (two) times daily.    Dispense:  360 capsule    Refill:  3  . Cyanocobalamin (B-12) 500 MCG TABS    Sig: Take 1 tablet by mouth every Monday, Wednesday, and Friday.    Dispense:  30 tablet   Orders Placed This Encounter  Procedures  . Ambulatory referral to Urology    Referral Priority:   Routine    Referral Type:   Consultation    Referral Reason:   Specialty Services Required    Requested Specialty:   Urology    Number of Visits Requested:   1  Follow up plan: Return in about 1 year (around 07/05/2019) for annual exam, prior fasting for blood work, medicare wellness visit.  Ria Bush, MD

## 2018-07-04 NOTE — Assessment & Plan Note (Signed)

## 2018-07-04 NOTE — Assessment & Plan Note (Signed)
S/p ACDF, continues f/u with ortho. Appreciate their care.

## 2018-07-04 NOTE — Assessment & Plan Note (Addendum)
Ongoing discomfort after fall off cherry tree - air cast brace very helpful - requests new cast, provided today.

## 2018-07-04 NOTE — Assessment & Plan Note (Addendum)
S/p TURP 08/2017 Will refer to local urology per pt request.

## 2018-07-04 NOTE — Assessment & Plan Note (Signed)
Advanced directives: has living will at home. HCPOA would be daughter. Would be ok with CPR and temporary trial of life support if reversible condition. Does not want prolonged life support.

## 2018-07-04 NOTE — Patient Instructions (Addendum)
Try lubricating eye drops (systane, hypotear, refresh).  If interested, check with pharmacy about new 2 shot shingles series (shingrix).  Change b12 vitamin to M-W-F.  Restart vit D daily.  You are doing well today. Return as needed or in 1 year for next wellness visit.  Health Maintenance, Male A healthy lifestyle and preventive care is important for your health and wellness. Ask your health care provider about what schedule of regular examinations is right for you. What should I know about weight and diet? Eat a Healthy Diet  Eat plenty of vegetables, fruits, whole grains, low-fat dairy products, and lean protein.  Do not eat a lot of foods high in solid fats, added sugars, or salt.  Maintain a Healthy Weight Regular exercise can help you achieve or maintain a healthy weight. You should:  Do at least 150 minutes of exercise each week. The exercise should increase your heart rate and make you sweat (moderate-intensity exercise).  Do strength-training exercises at least twice a week.  Watch Your Levels of Cholesterol and Blood Lipids  Have your blood tested for lipids and cholesterol every 5 years starting at 81 years of age. If you are at high risk for heart disease, you should start having your blood tested when you are 81 years old. You may need to have your cholesterol levels checked more often if: ? Your lipid or cholesterol levels are high. ? You are older than 81 years of age. ? You are at high risk for heart disease.  What should I know about cancer screening? Many types of cancers can be detected early and may often be prevented. Lung Cancer  You should be screened every year for lung cancer if: ? You are a current smoker who has smoked for at least 30 years. ? You are a former smoker who has quit within the past 15 years.  Talk to your health care provider about your screening options, when you should start screening, and how often you should be screened.  Colorectal  Cancer  Routine colorectal cancer screening usually begins at 81 years of age and should be repeated every 5-10 years until you are 81 years old. You may need to be screened more often if early forms of precancerous polyps or small growths are found. Your health care provider may recommend screening at an earlier age if you have risk factors for colon cancer.  Your health care provider may recommend using home test kits to check for hidden blood in the stool.  A small camera at the end of a tube can be used to examine your colon (sigmoidoscopy or colonoscopy). This checks for the earliest forms of colorectal cancer.  Prostate and Testicular Cancer  Depending on your age and overall health, your health care provider may do certain tests to screen for prostate and testicular cancer.  Talk to your health care provider about any symptoms or concerns you have about testicular or prostate cancer.  Skin Cancer  Check your skin from head to toe regularly.  Tell your health care provider about any new moles or changes in moles, especially if: ? There is a change in a mole's size, shape, or color. ? You have a mole that is larger than a pencil eraser.  Always use sunscreen. Apply sunscreen liberally and repeat throughout the day.  Protect yourself by wearing long sleeves, pants, a wide-brimmed hat, and sunglasses when outside.  What should I know about heart disease, diabetes, and high blood pressure?  If  you are 15-63 years of age, have your blood pressure checked every 3-5 years. If you are 3 years of age or older, have your blood pressure checked every year. You should have your blood pressure measured twice-once when you are at a hospital or clinic, and once when you are not at a hospital or clinic. Record the average of the two measurements. To check your blood pressure when you are not at a hospital or clinic, you can use: ? An automated blood pressure machine at a pharmacy. ? A home blood  pressure monitor.  Talk to your health care provider about your target blood pressure.  If you are between 60-73 years old, ask your health care provider if you should take aspirin to prevent heart disease.  Have regular diabetes screenings by checking your fasting blood sugar level. ? If you are at a normal weight and have a low risk for diabetes, have this test once every three years after the age of 79. ? If you are overweight and have a high risk for diabetes, consider being tested at a younger age or more often.  A one-time screening for abdominal aortic aneurysm (AAA) by ultrasound is recommended for men aged 91-75 years who are current or former smokers. What should I know about preventing infection? Hepatitis B If you have a higher risk for hepatitis B, you should be screened for this virus. Talk with your health care provider to find out if you are at risk for hepatitis B infection. Hepatitis C Blood testing is recommended for:  Everyone born from [birth year] through 1963.  Anyone with known risk factors for hepatitis C.  Sexually Transmitted Diseases (STDs)  You should be screened each year for STDs including gonorrhea and chlamydia if: ? You are sexually active and are younger than 81 years of age. ? You are older than 81 years of age and your health care provider tells you that you are at risk for this type of infection. ? Your sexual activity has changed since you were last screened and you are at an increased risk for chlamydia or gonorrhea. Ask your health care provider if you are at risk.  Talk with your health care provider about whether you are at high risk of being infected with HIV. Your health care provider may recommend a prescription medicine to help prevent HIV infection.  What else can I do?  Schedule regular health, dental, and eye exams.  Stay current with your vaccines (immunizations).  Do not use any tobacco products, such as cigarettes, chewing tobacco, and  e-cigarettes. If you need help quitting, ask your health care provider.  Limit alcohol intake to no more than 2 drinks per day. One drink equals 12 ounces of beer, 5 ounces of wine, or 1 ounces of hard liquor.  Do not use street drugs.  Do not share needles.  Ask your health care provider for help if you need support or information about quitting drugs.  Tell your health care provider if you often feel depressed.  Tell your health care provider if you have ever been abused or do not feel safe at home. This information is not intended to replace advice given to you by your health care provider. Make sure you discuss any questions you have with your health care provider. Document Released: 06/04/2008 Document Revised: 08/05/2016 Document Reviewed: 09/10/2015 Elsevier Interactive Patient Education  [employer].

## 2018-07-06 ENCOUNTER — Telehealth: Payer: Self-pay | Admitting: Family Medicine

## 2018-07-06 DIAGNOSIS — M4712 Other spondylosis with myelopathy, cervical region: Secondary | ICD-10-CM

## 2018-07-06 DIAGNOSIS — M4722 Other spondylosis with radiculopathy, cervical region: Principal | ICD-10-CM

## 2018-07-06 NOTE — Telephone Encounter (Signed)
Results given to Dr. Darnell Level.

## 2018-07-06 NOTE — Telephone Encounter (Signed)
Patient stopped by and dropped off a copy of his nerve conduction test results.  Patient said Dr.G didn't get a copy of the results when it was done. Results in rx tower.

## 2018-07-31 NOTE — Telephone Encounter (Addendum)
Reviewed, chart updated NCS: mild R mid cervical radiculopathy, very mild R CTS, mild L ulnar neuropathy (01/2018)

## 2018-08-30 ENCOUNTER — Encounter: Payer: Self-pay | Admitting: Family Medicine

## 2019-01-09 ENCOUNTER — Ambulatory Visit: Payer: Self-pay | Admitting: Urology

## 2019-01-17 ENCOUNTER — Ambulatory Visit (INDEPENDENT_AMBULATORY_CARE_PROVIDER_SITE_OTHER): Payer: Medicare Other | Admitting: Urology

## 2019-01-17 ENCOUNTER — Encounter: Payer: Self-pay | Admitting: Urology

## 2019-01-17 VITALS — BP 150/68 | HR 85 | Ht 67.0 in | Wt 170.3 lb

## 2019-01-17 DIAGNOSIS — R972 Elevated prostate specific antigen [PSA]: Secondary | ICD-10-CM | POA: Diagnosis not present

## 2019-01-17 DIAGNOSIS — N401 Enlarged prostate with lower urinary tract symptoms: Secondary | ICD-10-CM

## 2019-01-17 DIAGNOSIS — N400 Enlarged prostate without lower urinary tract symptoms: Secondary | ICD-10-CM | POA: Insufficient documentation

## 2019-01-17 DIAGNOSIS — Z9889 Other specified postprocedural states: Secondary | ICD-10-CM | POA: Insufficient documentation

## 2019-01-17 DIAGNOSIS — M542 Cervicalgia: Secondary | ICD-10-CM | POA: Insufficient documentation

## 2019-01-17 DIAGNOSIS — R29898 Other symptoms and signs involving the musculoskeletal system: Secondary | ICD-10-CM | POA: Insufficient documentation

## 2019-01-17 DIAGNOSIS — N138 Other obstructive and reflux uropathy: Secondary | ICD-10-CM | POA: Diagnosis not present

## 2019-01-17 LAB — MICROSCOPIC EXAMINATION
Bacteria, UA: NONE SEEN
EPITHELIAL CELLS (NON RENAL): NONE SEEN /HPF (ref 0–10)
WBC UA: NONE SEEN /HPF (ref 0–5)

## 2019-01-17 LAB — URINALYSIS, COMPLETE
Bilirubin, UA: NEGATIVE
Glucose, UA: NEGATIVE
Ketones, UA: NEGATIVE
Leukocytes, UA: NEGATIVE
NITRITE UA: NEGATIVE
PH UA: 5.5 (ref 5.0–7.5)
Protein, UA: NEGATIVE
RBC, UA: NEGATIVE
Specific Gravity, UA: 1.02 (ref 1.005–1.030)
UUROB: 0.2 mg/dL (ref 0.2–1.0)

## 2019-01-17 LAB — BLADDER SCAN AMB NON-IMAGING

## 2019-01-17 NOTE — Patient Instructions (Signed)

## 2019-01-17 NOTE — Progress Notes (Signed)
01/17/2019 1:34 PM   Mark Holmes 1935-04-05 [id number]  Referring provider: Ria Bush, MD [address] [place], [place] [address]  Chief Complaint  Patient presents with  . Benign Prostatic Hypertrophy   Urologic history: 1.  BPH with lower urinary tract symptoms  - PVP 2006 Dr. [name]  -TURP 08/2017 Dr. Jacqlyn Larsen at [facility]  2.  Elevated PSA  -Prior prostate biopsy; benign with 94 cc prostate  -Exact date unknown   HPI: 84 year old male presents to establish local urologic care.  He has most recently been followed by Dr. Jacqlyn Larsen since at least 2014.  TURP performed at [facility] in September 2018.  He states he is currently voiding much better than preoperatively though not as well as immediately after surgery.  He does have some decreased force and caliber of urinary stream with occasional urinary hesitancy.  IPSS completed today was 11/2.  Denies dysuria, gross hematuria or flank/abdominal/pelvic/scrotal pain.  He was not taken off dutasteride and terazosin postoperatively and continues these medications.  He had a PSA in July 2019 with Dr. Danise Mina which was 0.8.   PMH: Past Medical History:  Diagnosis Date  . Benign prostatic hypertrophy with elevated PSA    per Dr. Jacqlyn Larsen  . History of CT scan of brain 1992   normal  . Hyperglycemia 01/2006   102  . Hyperlipemia   . Hypertension   . Seizure disorder [facility])     Surgical History: Past Surgical History:  Procedure Laterality Date  . ANTERIOR CERVICAL DECOMP/DISCECTOMY FUSION  05/2018   for myelopathy -C3/4 ([place] @ [facility])  . CATARACT EXTRACTION W/ INTRAOCULAR LENS IMPLANT Right 2012   [facility]  . History of EEG  1985   normal  . hospitalization  1981   observation for seizuare  . TONSILLECTOMY    . TRANSURETHRAL RESECTION OF PROSTATE  10/2005   [name]  . TRANSURETHRAL RESECTION OF PROSTATE  08/2017   (rpt) [facility]    Home Medications:  Allergies as of 01/17/2019      Reactions   Dilaudid  [hydromorphone Hcl] Other (See Comments)   hallucinations      Medication List       Accurate as of January 17, 2019  1:34 PM. Always use your most recent med list.        aspirin-acetaminophen-caffeine 250-250-65 MG tablet Commonly known as:  EXCEDRIN MIGRAINE Take 1 tablet by mouth 2 (two) times daily as needed.   B-12 500 MCG Tabs Take 1 tablet by mouth every Monday, Wednesday, and Friday.   dutasteride 0.5 MG capsule Commonly known as:  AVODART Take 0.5 mg by mouth as directed. Every 3 days   fish oil-omega-3 fatty acids 1000 MG capsule Take 2 g by mouth daily. As directed   hydrochlorothiazide 12.5 MG capsule Commonly known as:  MICROZIDE TAKE ONE CAPSULE BY MOUTH EVERY MORNING   nitroGLYCERIN 0.4 MG SL tablet Commonly known as:  NITROSTAT Place 1 tablet (0.4 mg total) under the tongue every 5 (five) minutes as needed for chest pain.   oxyCODONE 5 MG immediate release tablet Commonly known as:  Oxy IR/ROXICODONE oxycodone 5 mg tablet   phenytoin 100 MG ER capsule Commonly known as:  DILANTIN Take 2 capsules (200 mg total) by mouth 2 (two) times daily.   ramipril 10 MG capsule Commonly known as:  ALTACE Take 1 capsule (10 mg total) by mouth daily.   terazosin 10 MG capsule Commonly known as:  HYTRIN Take 1 capsule (10  mg total) by mouth daily.   VITAMIN B-6 PO Take 100 mg by mouth daily.   Vitamin D3 25 MCG (1000 UT) Caps Take by mouth daily.       Allergies:  Allergies  Allergen Reactions  . Dilaudid [Hydromorphone Hcl] Other (See Comments)    hallucinations    Family History: Family History  Problem Relation Age of Onset  . Febrile seizures Sister   . Cancer Sister        lung/smoker  . CAD Neg Hx     Social History:  reports that he has never smoked. He has never used smokeless tobacco. He reports that he does not drink alcohol or use drugs.  ROS: UROLOGY Frequent Urination?: No Hard to postpone urination?: No Burning/pain with  urination?: No Get up at night to urinate?: No Leakage of urine?: No Urine stream starts and stops?: No Trouble starting stream?: No Do you have to strain to urinate?: No Blood in urine?: No Urinary tract infection?: No Sexually transmitted disease?: No Injury to kidneys or bladder?: No Painful intercourse?: No Weak stream?: No Erection problems?: No Penile pain?: No  Gastrointestinal Nausea?: No Vomiting?: No Indigestion/heartburn?: No Diarrhea?: No Constipation?: No  Constitutional Fever: No Night sweats?: No Weight loss?: No Fatigue?: No  Skin Skin rash/lesions?: No Itching?: No  Eyes Blurred vision?: No Double vision?: No  Ears/Nose/Throat Sore throat?: No Sinus problems?: No  Hematologic/Lymphatic Swollen glands?: No Easy bruising?: No  Cardiovascular Leg swelling?: No Chest pain?: No  Respiratory Cough?: No Shortness of breath?: No  Endocrine Excessive thirst?: No  Musculoskeletal Back pain?: Yes Joint pain?: Yes  Neurological Headaches?: No Dizziness?: No  Psychologic Depression?: No Anxiety?: No  Physical Exam: BP (!) 150/68 (BP Location: Left Arm, Patient Position: Sitting, Cuff Size: Normal)   Pulse 85   Ht 5\' 7"  (1.702 m)   Wt 170 lb 4.8 oz (77.2 kg)   BMI 26.67 kg/m   Constitutional:  Alert and oriented, No acute distress. HEENT: [place] AT, moist mucus membranes.  Trachea midline, no masses. Cardiovascular: No clubbing, cyanosis, or edema. Respiratory: Normal respiratory effort, no increased work of breathing. Skin: No rashes, bruises or suspicious lesions. Neurologic: Grossly intact, no focal deficits, moving all 4 extremities. Psychiatric: Normal mood and affect.  Laboratory Data:  Urinalysis Dipstick/microscopy negative  Assessment & Plan:   84 year old male with BPH and history of an elevated PSA.  He is currently voiding without problems.  PVR by bladder scan today was 0 mL.  Recommend annual follow-up and he was  instructed to call earlier for any significant change in his voiding pattern.   Abbie Sons, [place] [address], [place], [place] [address] [phone]

## 2019-06-19 ENCOUNTER — Other Ambulatory Visit: Payer: Self-pay

## 2019-06-19 MED ORDER — RAMIPRIL 10 MG PO CAPS: 10.0000 mg | ORAL_CAPSULE | Freq: Every day | ORAL | 3 refills | Status: DC

## 2019-06-19 MED ORDER — TERAZOSIN HCL 10 MG PO CAPS: 10.0000 mg | ORAL_CAPSULE | Freq: Every day | ORAL | 3 refills | Status: DC

## 2019-06-19 MED ORDER — HYDROCHLOROTHIAZIDE 12.5 MG PO CAPS: ORAL_CAPSULE | ORAL | 3 refills | Status: DC

## 2019-06-19 NOTE — Telephone Encounter (Signed)
Pt requesting 1 yr refill on ramipril,terazosin [facility]; pt has CPx scheduled on 07/10/19.Dr Darnell Level oked 1 yr refill on each. Pt voiced understanding and very appreciative.

## 2019-06-21 ENCOUNTER — Encounter: Payer: Self-pay | Admitting: Family Medicine

## 2019-06-21 HISTORY — PX: CARPAL TUNNEL RELEASE: SHX101

## 2019-07-05 ENCOUNTER — Other Ambulatory Visit: Payer: Self-pay | Admitting: Family Medicine

## 2019-07-05 DIAGNOSIS — E041 Nontoxic single thyroid nodule: Secondary | ICD-10-CM

## 2019-07-05 DIAGNOSIS — N138 Other obstructive and reflux uropathy: Secondary | ICD-10-CM

## 2019-07-05 DIAGNOSIS — E559 Vitamin D deficiency, unspecified: Secondary | ICD-10-CM

## 2019-07-05 DIAGNOSIS — G40909 Epilepsy, unspecified, not intractable, without status epilepticus: Secondary | ICD-10-CM

## 2019-07-05 DIAGNOSIS — N401 Enlarged prostate with lower urinary tract symptoms: Secondary | ICD-10-CM

## 2019-07-05 DIAGNOSIS — E785 Hyperlipidemia, unspecified: Secondary | ICD-10-CM

## 2019-07-06 ENCOUNTER — Other Ambulatory Visit: Payer: Self-pay

## 2019-07-06 ENCOUNTER — Other Ambulatory Visit (INDEPENDENT_AMBULATORY_CARE_PROVIDER_SITE_OTHER): Payer: Medicare Other

## 2019-07-06 DIAGNOSIS — N138 Other obstructive and reflux uropathy: Secondary | ICD-10-CM

## 2019-07-06 DIAGNOSIS — N401 Enlarged prostate with lower urinary tract symptoms: Secondary | ICD-10-CM | POA: Diagnosis not present

## 2019-07-06 DIAGNOSIS — E785 Hyperlipidemia, unspecified: Secondary | ICD-10-CM

## 2019-07-06 DIAGNOSIS — E041 Nontoxic single thyroid nodule: Secondary | ICD-10-CM | POA: Diagnosis not present

## 2019-07-06 DIAGNOSIS — E559 Vitamin D deficiency, unspecified: Secondary | ICD-10-CM | POA: Diagnosis not present

## 2019-07-06 DIAGNOSIS — G40909 Epilepsy, unspecified, not intractable, without status epilepticus: Secondary | ICD-10-CM | POA: Diagnosis not present

## 2019-07-06 LAB — COMPREHENSIVE METABOLIC PANEL
ALT: 13 U/L (ref 0–53)
AST: 14 U/L (ref 0–37)
Albumin: 4.2 g/dL (ref 3.5–5.2)
Alkaline Phosphatase: 105 U/L (ref 39–117)
BUN: 24 mg/dL — ABNORMAL HIGH (ref 6–23)
CO2: 27 mEq/L (ref 19–32)
Calcium: 9.2 mg/dL (ref 8.4–10.5)
Chloride: 104 mEq/L (ref 96–112)
Creatinine, Ser: 1.09 mg/dL (ref 0.40–1.50)
GFR: 64.81 mL/min (ref >60.00)
Glucose, Bld: 93 mg/dL (ref 70–99)
Potassium: 3.7 mEq/L (ref 3.5–5.1)
Sodium: 140 mEq/L (ref 135–145)
Total Bilirubin: 0.4 mg/dL (ref 0.2–1.2)
Total Protein: 6.3 g/dL (ref 6.0–8.3)

## 2019-07-06 LAB — LIPID PANEL
Cholesterol: 175 mg/dL (ref 0–200)
HDL: 41.9 mg/dL (ref >39.00)
LDL Cholesterol: 107 mg/dL — ABNORMAL HIGH (ref 0–99)
NonHDL: 133.44
Total CHOL/HDL Ratio: 4
Triglycerides: 132 mg/dL (ref 0.0–149.0)
VLDL: 26.4 mg/dL (ref 0.0–40.0)

## 2019-07-06 LAB — TSH: TSH: 0.67 u[IU]/mL (ref 0.35–4.50)

## 2019-07-06 LAB — PSA: PSA: 0.51 ng/mL (ref 0.10–4.00)

## 2019-07-06 LAB — VITAMIN D 25 HYDROXY (VIT D DEFICIENCY, FRACTURES): VITD: 34.98 ng/mL (ref 30.00–100.00)

## 2019-07-07 LAB — PHENYTOIN LEVEL, FREE: Phenytoin, Free: 0.7 ug/mL — ABNORMAL LOW (ref 1.0–2.0)

## 2019-07-10 ENCOUNTER — Encounter: Payer: Self-pay | Admitting: Family Medicine

## 2019-07-10 ENCOUNTER — Ambulatory Visit (INDEPENDENT_AMBULATORY_CARE_PROVIDER_SITE_OTHER): Payer: Medicare Other | Admitting: Family Medicine

## 2019-07-10 ENCOUNTER — Other Ambulatory Visit: Payer: Self-pay

## 2019-07-10 VITALS — BP 124/62 | HR 75 | Temp 98.3°F | Ht 67.0 in | Wt 169.3 lb

## 2019-07-10 DIAGNOSIS — I10 Essential (primary) hypertension: Secondary | ICD-10-CM

## 2019-07-10 DIAGNOSIS — G40909 Epilepsy, unspecified, not intractable, without status epilepticus: Secondary | ICD-10-CM

## 2019-07-10 DIAGNOSIS — E785 Hyperlipidemia, unspecified: Secondary | ICD-10-CM

## 2019-07-10 DIAGNOSIS — R011 Cardiac murmur, unspecified: Secondary | ICD-10-CM

## 2019-07-10 DIAGNOSIS — G8929 Other chronic pain: Secondary | ICD-10-CM

## 2019-07-10 DIAGNOSIS — N138 Other obstructive and reflux uropathy: Secondary | ICD-10-CM

## 2019-07-10 DIAGNOSIS — N401 Enlarged prostate with lower urinary tract symptoms: Secondary | ICD-10-CM

## 2019-07-10 DIAGNOSIS — E559 Vitamin D deficiency, unspecified: Secondary | ICD-10-CM

## 2019-07-10 DIAGNOSIS — Z7189 Other specified counseling: Secondary | ICD-10-CM

## 2019-07-10 DIAGNOSIS — M4712 Other spondylosis with myelopathy, cervical region: Secondary | ICD-10-CM

## 2019-07-10 DIAGNOSIS — Z0000 Encounter for general adult medical examination without abnormal findings: Secondary | ICD-10-CM

## 2019-07-10 DIAGNOSIS — M25571 Pain in right ankle and joints of right foot: Secondary | ICD-10-CM

## 2019-07-10 MED ORDER — PHENYTOIN SODIUM EXTENDED 100 MG PO CAPS: ORAL_CAPSULE | ORAL | 3 refills | Status: DC

## 2019-07-10 MED ORDER — DUTASTERIDE 0.5 MG PO CAPS: 0.5000 mg | ORAL_CAPSULE | ORAL | 3 refills | Status: DC

## 2019-07-10 MED ORDER — PHENYTOIN SODIUM EXTENDED 100 MG PO CAPS: 200.0000 mg | ORAL_CAPSULE | Freq: Two times a day (BID) | ORAL | 3 refills | Status: DC

## 2019-07-10 NOTE — Assessment & Plan Note (Signed)
Preventative protocols reviewed and updated unless pt declined. Discussed healthy diet and lifestyle.  

## 2019-07-10 NOTE — Progress Notes (Signed)
This visit was conducted in person.  BP 124/62 (BP Location: Left Arm, Patient Position: Sitting, Cuff Size: Normal)    Pulse 75    Temp 98.3 F (36.8 C) (Temporal)    Ht 5\' 7"  (1.702 m)    Wt 169 lb 4.8 oz (76.8 kg)    SpO2 95%    BMI 26.52 kg/m    CC: AMW  Subjective:    Patient ID: Mark Holmes, male    DOB: Aug 18, 1935, 84 y.o.   MRN: [id number]  HPI: Mark Holmes is a 84 y.o. male presenting on 07/10/2019 for Medicare Wellness (no new concerns - Dil[facility]in refill [facility]. Pt is no longer seeing a Urologist, wanting our office to take over Rx refills of his Avodart)   Did not see health coach this year.   No exam data present  Passes depression and fall risk screens  S/p ACDF C3/4 05/24/2018 by Dr Lynford Humphrey S/p R CTS release surgery 06/21/2019 - with some persistent hand paresthesias. Started OT.  Rare amnesia events but no frank seizure. Remote seizures, latest 19.   Preventative: Colon cancer screening - has not had colonoscopy before. Reassuring iFOB previously. Decides to age out.  Prostate - has seen uro (Dr. Jacqlyn Larsen at [facility] who has left) - last year established locally (). H/o BPH with elevated PSA s/p TURP on avodart and terazosin. Does get PSA checked by them. Saw uro - may see another provider next year.  Flu shot - does not receive. Pneumovax 2011, prevnar 2015. Tetanus shot Td 2011.  Shingrix - h/o shingles. Declines.  Advanced directives: has living will at home. HCPOA would be daughter. Would be ok with CPR and temporary trial of life support if reversible condition. Does not want prolonged life support. Does not want copy in our chart at this time - states daughter will be available. Seat belt use discussed.  Sunscreen use discussed. No changing moles. Non smoker Alcohol - rare Dentist - yearly Eye exam - yearly Bowel - no constipation Bladder - no incontinence  Caffeine: 1 cup/day, 2 excedrin/day  Married since 1959  2  daughters  Occupation: retired, was Banker)  Activity: walking dog, stays active with grandson (swimming)  Diet: avoids carbs, good water, fruits/vegetables daily      Relevant past medical, surgical, family and social history reviewed and updated as indicated. Interim medical history since our last visit reviewed. Allergies and medications reviewed and updated. Outpatient Medications Prior to Visit  Medication Sig Dispense Refill   aspirin-acetaminophen-caffeine (EXCEDRIN MIGRAINE) 250-250-65 MG per tablet Take 1 tablet by mouth 2 (two) times daily as needed.      Cholecalciferol (VITAMIN D3) 1000 UNITS CAPS Take by mouth daily.       Cyanocobalamin (B-12) 500 MCG TABS Take 1 tablet by mouth every Monday, Wednesday, and Friday. 30 tablet    fish oil-omega-3 fatty acids 1000 MG capsule Take 2 g by mouth daily. As directed      hydrochlorothiazide (MICROZIDE) 12.5 MG capsule TAKE ONE CAPSULE BY MOUTH EVERY MORNING 90 capsule 3   nitroGLYCERIN (NITROSTAT) 0.4 MG SL tablet Place 1 tablet (0.4 mg total) under the tongue every 5 (five) minutes as needed for chest pain. 50 tablet 3   oxyCODONE (OXY IR/ROXICODONE) 5 MG immediate release tablet oxycodone 5 mg tablet     Pyridoxine HCl (VITAMIN B-6 PO) Take 100 mg by mouth daily.     ramipril (ALTACE) 10 MG capsule Take 1 capsule (10  mg total) by mouth daily. 90 capsule 3   terazosin (HYTRIN) 10 MG capsule Take 1 capsule (10 mg total) by mouth daily. 90 capsule 3   dutasteride (AVODART) 0.5 MG capsule Take 0.5 mg by mouth as directed. Every 3 days     phenytoin (DILANTIN) 100 MG ER capsule Take 2 capsules (200 mg total) by mouth 2 (two) times daily. 360 capsule 3   No facility-administered medications prior to visit.      Per HPI unless specifically indicated in ROS section below Review of Systems  Constitutional: Negative for activity change, appetite change, chills, fatigue, fever and unexpected weight change.    HENT: Negative for hearing loss.   Eyes: Negative for visual disturbance.  Respiratory: Negative for cough, chest tightness, shortness of breath and wheezing.   Cardiovascular: Negative for chest pain, palpitations and leg swelling.  Gastrointestinal: Negative for abdominal distention, abdominal pain, blood in stool, constipation, diarrhea, nausea and vomiting.  Genitourinary: Negative for difficulty urinating and hematuria.  Musculoskeletal: Negative for arthralgias, myalgias and neck pain.  Skin: Negative for rash.  Neurological: Negative for dizziness, seizures, syncope and headaches.  Hematological: Negative for adenopathy. Does not bruise/bleed easily.  Psychiatric/Behavioral: Negative for dysphoric mood. The patient is not nervous/anxious.    Objective:    BP 124/62 (BP Location: Left Arm, Patient Position: Sitting, Cuff Size: Normal)    Pulse 75    Temp 98.3 F (36.8 C) (Temporal)    Ht 5\' 7"  (1.702 m)    Wt 169 lb 4.8 oz (76.8 kg)    SpO2 95%    BMI 26.52 kg/m   Wt Readings from Last 3 Encounters:  07/10/19 169 lb 4.8 oz (76.8 kg)  01/17/19 170 lb 4.8 oz (77.2 kg)  07/04/18 171 lb 8 oz (77.8 kg)     Physical Exam Vitals signs and nursing note reviewed.  Constitutional:      General: He is not in acute distress.    Appearance: Normal appearance. He is well-developed.  HENT:     Head: Normocephalic and atraumatic.     Right Ear: Hearing, tympanic membrane, ear canal and external ear normal.     Left Ear: Hearing, tympanic membrane, ear canal and external ear normal.     Nose: Nose normal.     Mouth/Throat:     Mouth: Mucous membranes are moist.     Pharynx: Uvula midline. No oropharyngeal exudate or posterior oropharyngeal erythema.  Eyes:     General: No scleral icterus.    Extraocular Movements: Extraocular movements intact.     Conjunctiva/sclera: Conjunctivae normal.     Pupils: Pupils are equal, round, and reactive to light.  Neck:     Musculoskeletal: Normal  range of motion and neck supple.  Cardiovascular:     Rate and Rhythm: Normal rate and regular rhythm.     Pulses: Normal pulses.          Radial pulses are 2+ on the right side and 2+ on the left side.     Heart sounds: Normal heart sounds. No murmur.  Pulmonary:     Effort: Pulmonary effort is normal. No respiratory distress.     Breath sounds: Normal breath sounds. No wheezing, rhonchi or rales.  Abdominal:     General: Abdomen is flat. Bowel sounds are normal. There is no distension.     Palpations: Abdomen is soft. There is no mass.     Tenderness: There is no abdominal tenderness. There is no guarding or rebound.  Hernia: No hernia is present.  Lymphadenopathy:     Cervical: No cervical adenopathy.  Skin:    General: Skin is warm and dry.     Findings: No rash.  Neurological:     General: No focal deficit present.     Mental Status: He is alert and oriented to person, place, and time.     Comments:  CN grossly intact, station and gait intact Recall 3/3 Calculation D-L-[id number]-W  Psychiatric:        Mood and Affect: Mood normal.        Behavior: Behavior normal.        Thought Content: Thought content normal.        Judgment: Judgment normal.       Results for orders placed or performed in visit on 07/06/19  Phenytoin level, free  Result Value Ref Range   Phenytoin, Free 0.7 (L) 1.0 - 2.0 ug/mL  VITAMIN D 25 Hydroxy (Vit-D Deficiency, Fractures)  Result Value Ref Range   VITD 34.98 30.00 - 100.00 ng/mL  PSA  Result Value Ref Range   PSA 0.51 0.10 - 4.00 ng/mL  TSH  Result Value Ref Range   TSH 0.67 0.35 - 4.50 uIU/mL  Comprehensive metabolic panel  Result Value Ref Range   Sodium 140 135 - 145 mEq/L   Potassium 3.7 3.5 - 5.1 mEq/L   Chloride 104 96 - 112 mEq/L   CO2 27 19 - 32 mEq/L   Glucose, Bld 93 70 - 99 mg/dL   BUN 24 (H) 6 - 23 mg/dL   Creatinine, Ser 1.09 0.40 - 1.50 mg/dL   Total Bilirubin 0.4 0.2 - 1.2 mg/dL   Alkaline Phosphatase 105 39 - 117 U/L    AST 14 0 - 37 U/L   ALT 13 0 - 53 U/L   Total Protein 6.3 6.0 - 8.3 g/dL   Albumin 4.2 3.5 - 5.2 g/dL   Calcium 9.2 8.4 - 10.5 mg/dL   GFR 64.81 >60.00 mL/min  Lipid panel  Result Value Ref Range   Cholesterol 175 0 - 200 mg/dL   Triglycerides 132.0 0.0 - 149.0 mg/dL   HDL 41.90 >39.00 mg/dL   VLDL 26.4 0.0 - 40.0 mg/dL   LDL Cholesterol 107 (H) 0 - 99 mg/dL   Total CHOL/HDL Ratio 4    NonHDL 133.44    Assessment & Plan:   Problem List Items Addressed This Visit    Vitamin D deficiency    Levels now normal.       Systolic murmur    Not appreciated today.       Seizure disorder [facility])    Denies recent seizures but he has had some "amnestic" events where his wife gives him an extra phenytoin. Levels today low. Declines neurology referral. Reasonable to increase dosing by 1 cap/day. Advised if recurrent amnesia once normal levels, or any repeat seizure, will need neuro f/u.       Relevant Medications   phenytoin (DILANTIN) 100 MG ER capsule   Medicare annual wellness visit, subsequent - Primary    I have personally reviewed the Medicare Annual Wellness questionnaire and have noted 1. The patient's medical and social history 2. Their use of alcohol, tobacco or illicit drugs 3. Their current medications and supplements 4. The patient's functional ability including ADL's, fall risks, home safety risks and hearing or visual impairment. Cognitive function has been assessed and addressed as indicated.  5. Diet and physical activity 6. Evidence for depression or mood  disorders The patients weight, height, BMI have been recorded in the chart. I have made referrals, counseling and provided education to the patient based on review of the above and I have provided the pt with a written personalized care plan for preventive services. Provider list updated.. See scanned questionairre as needed for further documentation. Reviewed preventative protocols and updated unless pt declined.        HLD (hyperlipidemia)    Chronic, off statin. No known vascular disease so will not start at this time given age.  The ASCVD Risk score Mikey Bussing DC Jr., et al., 2013) failed to calculate for the following reasons:   The 2013 ASCVD risk score is only valid for ages 65 to 68       Health maintenance examination    Preventative protocols reviewed and updated unless pt declined. Discussed healthy diet and lifestyle.       Essential hypertension    Chronic, stable. Continue current regimen.       Chronic ankle pain    Regularly uses air cast brace with benefit for chronic pain after remote fall out of cherry tree. Will let me know if desires ortho evaluation.       Relevant Medications   phenytoin (DILANTIN) 100 MG ER capsule   Cervical spondylosis with myelopathy and radiculopathy    Appreciate ortho care.       Benign prostatic hyperplasia with urinary obstruction    avodart refilled today per pt request. Will reschedule uro appt.       Relevant Medications   dutasteride (AVODART) 0.5 MG capsule   Advanced care planning/counseling discussion       Meds ordered this encounter  Medications   dutasteride (AVODART) 0.5 MG capsule    Sig: Take 1 capsule (0.5 mg total) by mouth every other day.    Dispense:  45 capsule    Refill:  3   DISCONTD: phenytoin (DILANTIN) 100 MG ER capsule    Sig: Take 2 capsules (200 mg total) by mouth 2 (two) times daily.    Dispense:  360 capsule    Refill:  3   phenytoin (DILANTIN) 100 MG ER capsule    Sig: Take 2 capsules (200mg ) in the morning and 3 (300mg ) at night    Dispense:  450 capsule    Refill:  3    Note new sig   No orders of the defined types were placed in this encounter.   Follow up plan: Return in about 1 year (around 07/09/2020) for annual exam, prior fasting for blood work, medicare wellness visit.  Ria Bush, MD

## 2019-07-10 NOTE — Patient Instructions (Addendum)
Increase dilantin by 1 capsule daily. Let us know if ongoing trouble. You are doing well today Return in 1 year for next wellness visit.   Health Maintenance After Age 82 After age 48, you are at a higher risk for certain long-term diseases and infections as well as injuries from falls. Falls are a major cause of broken bones and head injuries in people who are older than age 9. Getting regular preventive care can help to keep you healthy and well. Preventive care includes getting regular testing and making lifestyle changes as recommended by your health care provider. Talk with your health care provider about:  Which screenings and tests you should have. A screening is a test that checks for a disease when you have no symptoms.  A diet and exercise plan that is right for you. What should I know about screenings and tests to prevent falls? Screening and testing are the best ways to find a health problem early. Early diagnosis and treatment give you the best chance of managing medical conditions that are common after age 24. Certain conditions and lifestyle choices may make you more likely to have a fall. Your health care provider may recommend:  Regular vision checks. Poor vision and conditions such as cataracts can make you more likely to have a fall. If you wear glasses, make sure to get your prescription updated if your vision changes.  Medicine review. Work with your health care provider to regularly review all of the medicines you are taking, including over-the-counter medicines. Ask your health care provider about any side effects that may make you more likely to have a fall. Tell your health care provider if any medicines that you take make you feel dizzy or sleepy.  Osteoporosis screening. Osteoporosis is a condition that causes the bones to get weaker. This can make the bones weak and cause them to break more easily.  Blood pressure screening. Blood pressure changes and medicines to  control blood pressure can make you feel dizzy.  Strength and balance checks. Your health care provider may recommend certain tests to check your strength and balance while standing, walking, or changing positions.  Foot health exam. Foot pain and numbness, as well as not wearing proper footwear, can make you more likely to have a fall.  Depression screening. You may be more likely to have a fall if you have a fear of falling, feel emotionally low, or feel unable to do activities that you used to do.  Alcohol use screening. Using too much alcohol can affect your balance and may make you more likely to have a fall. What actions can I take to lower my risk of falls? General instructions  Talk with your health care provider about your risks for falling. Tell your health care provider if: ? You fall. Be sure to tell your health care provider about all falls, even ones that seem minor. ? You feel dizzy, sleepy, or off-balance.  Take over-the-counter and prescription medicines only as told by your health care provider. These include any supplements.  Eat a healthy diet and maintain a healthy weight. A healthy diet includes low-fat dairy products, low-fat (lean) meats, and fiber from whole grains, beans, and lots of fruits and vegetables. Home safety  Remove any tripping hazards, such as rugs, cords, and clutter.  Install safety equipment such as grab bars in bathrooms and safety rails on stairs.  Keep rooms and walkways well-lit. Activity   Follow a regular exercise program to stay fit.  This will help you maintain your balance. Ask your health care provider what types of exercise are appropriate for you.  If you need a cane or walker, use it as recommended by your health care provider.  Wear supportive shoes that have nonskid soles. Lifestyle  Do not drink alcohol if your health care provider tells you not to drink.  If you drink alcohol, limit how much you have: ? 0-1 drink a day  for women. ? 0-2 drinks a day for men.  Be aware of how much alcohol is in your drink. In the [place] one drink equals one typical bottle of beer (12 oz), one-half glass of wine (5 oz), or one shot of hard liquor (1 oz).  Do not use any products that contain nicotine or tobacco, such as cigarettes and e-cigarettes. If you need help quitting, ask your health care provider. Summary  Having a healthy lifestyle and getting preventive care can help to protect your health and wellness after age 31.  Screening and testing are the best way to find a health problem early and help you avoid having a fall. Early diagnosis and treatment give you the best chance for managing medical conditions that are more common for people who are older than age 52.  Falls are a major cause of broken bones and head injuries in people who are older than age 58. Take precautions to prevent a fall at home.  Work with your health care provider to learn what changes you can make to improve your health and wellness and to prevent falls. This information is not intended to replace advice given to you by your health care provider. Make sure you discuss any questions you have with your health care provider. Document Released: 10/20/2017 Document Revised: 03/30/2019 Document Reviewed: 10/20/2017 Elsevier Patient Education  2020 [employer].

## 2019-07-10 NOTE — Assessment & Plan Note (Signed)
Regularly uses air cast brace with benefit for chronic pain after remote fall out of cherry tree. Will let me know if desires ortho evaluation.

## 2019-07-10 NOTE — Assessment & Plan Note (Signed)
avodart refilled today per pt request. Will reschedule uro appt.

## 2019-07-10 NOTE — Assessment & Plan Note (Signed)
Appreciate ortho care.

## 2019-07-10 NOTE — Assessment & Plan Note (Signed)
Not appreciated today.  

## 2019-07-10 NOTE — Assessment & Plan Note (Signed)
Chronic, off statin. No known vascular disease so will not start at this time given age.  The ASCVD Risk score Mikey Bussing DC Jr., et al., 2013) failed to calculate for the following reasons:   The 2013 ASCVD risk score is only valid for ages 40 to 36

## 2019-07-10 NOTE — Assessment & Plan Note (Signed)
Chronic, stable. Continue current regimen. 

## 2019-07-10 NOTE — Assessment & Plan Note (Signed)

## 2019-07-10 NOTE — Assessment & Plan Note (Signed)
Levels now normal.

## 2019-07-10 NOTE — Assessment & Plan Note (Signed)
Denies recent seizures but he has had some "amnestic" events where his wife gives him an extra phenytoin. Levels today low. Declines neurology referral. Reasonable to increase dosing by 1 cap/day. Advised if recurrent amnesia once normal levels, or any repeat seizure, will need neuro f/u.

## 2019-10-23 ENCOUNTER — Other Ambulatory Visit: Payer: Self-pay | Admitting: Urology

## 2019-10-23 NOTE — Telephone Encounter (Signed)
Pt needs a new RX for Avodart sent to [facility].  RX was originally sent by Dr Jacqlyn Larsen.  Please call pt and let him know when this is done.  He is out of meds.

## 2019-10-24 MED ORDER — DUTASTERIDE 0.5 MG PO CAPS: 0.5000 mg | ORAL_CAPSULE | Freq: Every day | ORAL | 0 refills | Status: DC

## 2019-11-07 ENCOUNTER — Ambulatory Visit (INDEPENDENT_AMBULATORY_CARE_PROVIDER_SITE_OTHER): Payer: Medicare Other | Admitting: Family Medicine

## 2019-11-07 ENCOUNTER — Encounter: Payer: Self-pay | Admitting: Family Medicine

## 2019-11-07 ENCOUNTER — Other Ambulatory Visit: Payer: Self-pay

## 2019-11-07 VITALS — BP 116/68 | HR 78 | Temp 97.8°F | Ht 67.0 in | Wt 160.6 lb

## 2019-11-07 DIAGNOSIS — R270 Ataxia, unspecified: Secondary | ICD-10-CM

## 2019-11-07 DIAGNOSIS — G6289 Other specified polyneuropathies: Secondary | ICD-10-CM

## 2019-11-07 DIAGNOSIS — M4722 Other spondylosis with radiculopathy, cervical region: Secondary | ICD-10-CM

## 2019-11-07 DIAGNOSIS — M4712 Other spondylosis with myelopathy, cervical region: Secondary | ICD-10-CM | POA: Diagnosis not present

## 2019-11-07 LAB — COMPREHENSIVE METABOLIC PANEL
ALT: 12 U/L (ref 0–53)
AST: 13 U/L (ref 0–37)
Albumin: 4.3 g/dL (ref 3.5–5.2)
Alkaline Phosphatase: 105 U/L (ref 39–117)
BUN: 25 mg/dL — ABNORMAL HIGH (ref 6–23)
CO2: 30 mEq/L (ref 19–32)
Calcium: 9.4 mg/dL (ref 8.4–10.5)
Chloride: 101 mEq/L (ref 96–112)
Creatinine, Ser: 1.16 mg/dL (ref 0.40–1.50)
GFR: 60.27 mL/min (ref >60.00)
Glucose, Bld: 104 mg/dL — ABNORMAL HIGH (ref 70–99)
Potassium: 3.9 mEq/L (ref 3.5–5.1)
Sodium: 139 mEq/L (ref 135–145)
Total Bilirubin: 0.3 mg/dL (ref 0.2–1.2)
Total Protein: 6.7 g/dL (ref 6.0–8.3)

## 2019-11-07 LAB — SEDIMENTATION RATE: Sed Rate: 3 mm/hr (ref 0–20)

## 2019-11-07 LAB — CBC WITH DIFFERENTIAL/PLATELET
Basophils Absolute: 0.1 10*3/uL (ref 0.0–0.1)
Basophils Relative: 0.7 % (ref 0.0–3.0)
Eosinophils Absolute: 0.1 10*3/uL (ref 0.0–0.7)
Eosinophils Relative: 1 % (ref 0.0–5.0)
HCT: 37.6 % — ABNORMAL LOW (ref 39.0–52.0)
Hemoglobin: 13 g/dL (ref 13.0–17.0)
Lymphocytes Relative: 18.7 % (ref 12.0–46.0)
Lymphs Abs: 1.6 10*3/uL (ref 0.7–4.0)
MCHC: 34.7 g/dL (ref 30.0–36.0)
MCV: 87.2 fl (ref 78.0–100.0)
Monocytes Absolute: 0.6 10*3/uL (ref 0.1–1.0)
Monocytes Relative: 7.3 % (ref 3.0–12.0)
Neutro Abs: 6.2 10*3/uL (ref 1.4–7.7)
Neutrophils Relative %: 72.3 % (ref 43.0–77.0)
Platelets: 289 10*3/uL (ref 150.0–400.0)
RBC: 4.31 Mil/uL (ref 4.22–5.81)
RDW: 13.3 % (ref 11.5–15.5)
WBC: 8.6 10*3/uL (ref 4.0–10.5)

## 2019-11-07 LAB — FOLATE: Folate: 8 ng/mL (ref >5.9)

## 2019-11-07 LAB — VITAMIN B12: Vitamin B-12: 506 pg/mL (ref 211–911)

## 2019-11-07 NOTE — Patient Instructions (Addendum)
Labs today  For worsening unsteadiness, we will check neck MRI with contrast and refer you to neurology.  Restart B12 supplement Monday Wednesday and Friday Let us know if any worsening symptoms.

## 2019-11-07 NOTE — Progress Notes (Signed)
This visit was conducted in person.  BP 116/68 (BP Location: Left Arm, Patient Position: Sitting, Cuff Size: Normal)   Pulse 78   Temp 97.8 F (36.6 C) (Temporal)   Ht 5\' 7"  (1.702 m)   Wt 160 lb 9 oz (72.8 kg)   SpO2 96%   BMI 25.15 kg/m    CC: weakness, imbalance Subjective:    Patient ID: Mark Holmes, male    DOB: 1935/05/08, 84 y.o.   MRN: [id number]  HPI: Mark Holmes is a 84 y.o. male presenting on 11/07/2019 for Weakness (C/o bilateral knee weakness radiating up into right shoulder.  Started 3-4 mos ago. States it affects his gait/balance. )   3-4 mo h/o B knee weakness when he stands associated with some some stiffness/tightness but no pain. Sometimes has arm weakness but not as noticeable as leg weakness. Feels this causes balance to be off - has to walk with wider gait to feel balanced. Denies significant lower back pain, denies inciting trauma/injury or falls. He has had some falls at night due to unsteadiness. No new joint pains. Denies significant hand/foot paresthesias. More trouble walking barefoot.   Has had 3 episodes of heat sensation rushing from shoulders down arms - this has happened while watching TV.   S/p R sided CTS release 06/2019 Astrid Divine).  S/p ACDF for myelopathy C3/4 ([facility]) (05/2018) - [facility] in [place].   Off b12 for a while (h/o elevated levels previously).      Relevant past medical, surgical, family and social history reviewed and updated as indicated. Interim medical history since our last visit reviewed. Allergies and medications reviewed and updated. Outpatient Medications Prior to Visit  Medication Sig Dispense Refill  . aspirin-acetaminophen-caffeine (EXCEDRIN MIGRAINE) 250-250-65 MG per tablet Take 1 tablet by mouth 2 (two) times daily as needed.     . Cholecalciferol (VITAMIN D3) 1000 UNITS CAPS Take by mouth daily.      . Cyanocobalamin (B-12) 500 MCG TABS Take 1 tablet by mouth every Monday, Wednesday, and  Friday. 30 tablet   . dutasteride (AVODART) 0.5 MG capsule Take 1 capsule (0.5 mg total) by mouth daily. 90 capsule 0  . fish oil-omega-3 fatty acids 1000 MG capsule Take 2 g by mouth daily. As directed     . hydrochlorothiazide (MICROZIDE) 12.5 MG capsule TAKE ONE CAPSULE BY MOUTH EVERY MORNING 90 capsule 3  . nitroGLYCERIN (NITROSTAT) 0.4 MG SL tablet Place 1 tablet (0.4 mg total) under the tongue every 5 (five) minutes as needed for chest pain. 50 tablet 3  . oxyCODONE (OXY IR/ROXICODONE) 5 MG immediate release tablet oxycodone 5 mg tablet    . phenytoin (DILANTIN) 100 MG ER capsule Take 2 capsules (200mg ) in the morning and 3 (300mg ) at night 450 capsule 3  . Pyridoxine HCl (VITAMIN B-6 PO) Take 100 mg by mouth daily.    . ramipril (ALTACE) 10 MG capsule Take 1 capsule (10 mg total) by mouth daily. 90 capsule 3  . terazosin (HYTRIN) 10 MG capsule Take 1 capsule (10 mg total) by mouth daily. 90 capsule 3   No facility-administered medications prior to visit.     Past Surgical History:  Procedure Laterality Date  . ANTERIOR CERVICAL DECOMP/DISCECTOMY FUSION  05/2018   for myelopathy -C3/4 ([facility] @ [facility])  . CATARACT EXTRACTION W/ INTRAOCULAR LENS IMPLANT Right 2012   [facility]  . History of EEG  1985   normal  . hospitalization  1981   observation for seizuare  .  TONSILLECTOMY    . TRANSURETHRAL RESECTION OF PROSTATE  10/2005   [name]  . TRANSURETHRAL RESECTION OF PROSTATE  08/2017   (rpt) [facility]    Per HPI unless specifically indicated in ROS section below Review of Systems Objective:    BP 116/68 (BP Location: Left Arm, Patient Position: Sitting, Cuff Size: Normal)   Pulse 78   Temp 97.8 F (36.6 C) (Temporal)   Ht 5\' 7"  (1.702 m)   Wt 160 lb 9 oz (72.8 kg)   SpO2 96%   BMI 25.15 kg/m   Wt Readings from Last 3 Encounters:  11/07/19 160 lb 9 oz (72.8 kg)  07/10/19 169 lb 4.8 oz (76.8 kg)  01/17/19 170 lb 4.8 oz (77.2 kg)    Physical Exam Vitals signs and  nursing note reviewed.  Constitutional:      General: He is not in acute distress.    Appearance: Normal appearance. He is not ill-appearing.  Neck:     Musculoskeletal: Normal range of motion and neck supple. No neck rigidity or muscular tenderness.     Comments: No midline cervical or paracervical mm tenderness Musculoskeletal: Normal range of motion.     Right lower leg: No edema.     Left lower leg: No edema.  Skin:    General: Skin is warm and dry.     Capillary Refill: Capillary refill takes less than 2 seconds.  Neurological:     Mental Status: He is alert.     Cranial Nerves: Cranial nerves are intact.     Sensory: Sensation is intact.     Motor: Weakness (mild weakness BLE) present.     Coordination: Romberg sign positive. Coordination abnormal. Finger-Nose-Finger Test normal.     Gait: Gait abnormal and tandem walk abnormal.     Deep Tendon Reflexes:     Reflex Scores:      Bicep reflexes are 3+ on the right side and 3+ on the left side.      Patellar reflexes are 2+ on the right side and 2+ on the left side.    Comments:  5/5 BUE strength  4/5 BLE strength Unsteady with romberg Unable to stand steady with feet together Wide based gait       Results for orders placed or performed in visit on 11/07/19  CBC with Differential  Result Value Ref Range   WBC 8.6 4.0 - 10.5 K/uL   RBC 4.31 4.22 - 5.81 Mil/uL   Hemoglobin 13.0 13.0 - 17.0 g/dL   HCT 37.6 (L) 39.0 - 52.0 %   MCV 87.2 78.0 - 100.0 fl   MCHC 34.7 30.0 - 36.0 g/dL   RDW 13.3 11.5 - 15.5 %   Platelets 289.0 150.0 - 400.0 K/uL   Neutrophils Relative % 72.3 43.0 - 77.0 %   Lymphocytes Relative 18.7 12.0 - 46.0 %   Monocytes Relative 7.3 3.0 - 12.0 %   Eosinophils Relative 1.0 0.0 - 5.0 %   Basophils Relative 0.7 0.0 - 3.0 %   Neutro Abs 6.2 1.4 - 7.7 K/uL   Lymphs Abs 1.6 0.7 - 4.0 K/uL   Monocytes Absolute 0.6 0.1 - 1.0 K/uL   Eosinophils Absolute 0.1 0.0 - 0.7 K/uL   Basophils Absolute 0.1 0.0 - 0.1  K/uL  Vitamin B12  Result Value Ref Range   Vitamin B-12 506 211 - 911 pg/mL  Sedimentation rate  Result Value Ref Range   Sed Rate 3 0 - 20 mm/hr  Folate  Result  Value Ref Range   Folate 8.0 >5.9 ng/mL  Comprehensive metabolic panel  Result Value Ref Range   Sodium 139 135 - 145 mEq/L   Potassium 3.9 3.5 - 5.1 mEq/L   Chloride 101 96 - 112 mEq/L   CO2 30 19 - 32 mEq/L   Glucose, Bld 104 (H) 70 - 99 mg/dL   BUN 25 (H) 6 - 23 mg/dL   Creatinine, Ser 1.16 0.40 - 1.50 mg/dL   Total Bilirubin 0.3 0.2 - 1.2 mg/dL   Alkaline Phosphatase 105 39 - 117 U/L   AST 13 0 - 37 U/L   ALT 12 0 - 53 U/L   Total Protein 6.7 6.0 - 8.3 g/dL   Albumin 4.3 3.5 - 5.2 g/dL   GFR 60.27 >60.00 mL/min   Calcium 9.4 8.4 - 10.5 mg/dL   Lab Results  Component Value Date   VITAMINB12 506 11/07/2019    Assessment & Plan:  Declines flu shot today Problem List Items Addressed This Visit    Peripheral neuropathy   Cervical spondylosis with myelopathy and radiculopathy    S/p ACDF. Concern for recurrence given symptoms - check MRI. If no evidence of compression, will refer to neurology.       Relevant Orders   MR CERVICAL SPINE W CONTRAST   Ataxia - Primary    Subjective lower extremity weakness with abnormal gait/coordination on exam in h/o myelopathy s/p ACDF 2019. Concern for new cervical impingement - will check contrasted cervical MRI. He has been off b12 - update levels, advised restart MWF dosin. Consider neurology referral pending results.       Relevant Orders   CBC with Differential (Completed)   Vitamin B12 (Completed)   Sedimentation rate (Completed)   Folate (Completed)   Comprehensive metabolic panel (Completed)   MR CERVICAL SPINE W CONTRAST       No orders of the defined types were placed in this encounter.  Orders Placed This Encounter  Procedures  . MR CERVICAL SPINE W CONTRAST    Standing Status:   Future    Standing Expiration Date:   01/07/2021    Order Specific  Question:   ** REASON FOR EXAM (FREE TEXT)    Answer:   progressive ataxia in h/o cervical myelopathy and ACDF    Order Specific Question:   If indicated for the ordered procedure, I authorize the administration of contrast media per Radiology protocol    Answer:   Yes    Order Specific Question:   What is the patient's sedation requirement?    Answer:   No Sedation    Order Specific Question:   Does the patient have a pacemaker or implanted devices?    Answer:   No    Order Specific Question:   Radiology Contrast Protocol - do NOT remove file path    Answer:   [email or web address]    Order Specific Question:   Preferred imaging location?    Answer:   [facility] (table limit-550lbs)  . CBC with Differential  . Vitamin B12  . Sedimentation rate  . Folate  . Comprehensive metabolic panel   Patient Instructions  Labs today  For worsening unsteadiness, we will check neck MRI with contrast and refer you to neurology.  Restart B12 supplement Monday Wednesday and Friday Let us know if any worsening symptoms.    Follow up plan: No follow-ups on file.  Ria Bush, MD

## 2019-11-08 ENCOUNTER — Telehealth: Payer: Self-pay | Admitting: Family Medicine

## 2019-11-08 DIAGNOSIS — R2681 Unsteadiness on feet: Secondary | ICD-10-CM | POA: Insufficient documentation

## 2019-11-08 DIAGNOSIS — G959 Disease of spinal cord, unspecified: Secondary | ICD-10-CM

## 2019-11-08 DIAGNOSIS — G3281 Cerebellar ataxia in diseases classified elsewhere: Secondary | ICD-10-CM

## 2019-11-08 DIAGNOSIS — R270 Ataxia, unspecified: Secondary | ICD-10-CM | POA: Insufficient documentation

## 2019-11-08 NOTE — Assessment & Plan Note (Addendum)
S/p ACDF. Concern for recurrence given symptoms - check MRI. If no evidence of compression, will refer to neurology.

## 2019-11-08 NOTE — Telephone Encounter (Signed)
Recommend we start with neck MRI and if unrevealing will proceed with neurology evaluation.

## 2019-11-08 NOTE — Assessment & Plan Note (Signed)
Subjective lower extremity weakness with abnormal gait/coordination on exam in h/o myelopathy s/p ACDF 2019. Concern for new cervical impingement - will check contrasted cervical MRI. He has been off b12 - update levels, advised restart MWF dosin. Consider neurology referral pending results.

## 2019-11-08 NOTE — Telephone Encounter (Signed)
Patient stated that a referral was place for him to have a MRI of his neck He would like to know if he could also have an MRI of his head while he is there having his neck done    Patient is requesting a call back if this can or cannot be done

## 2019-11-08 NOTE — Addendum Note (Signed)
Addended by: Ria Bush on: 11/08/2019 11:16 AM   Modules accepted: Orders

## 2019-11-08 NOTE — Telephone Encounter (Signed)
Spoke with patient and he is ok with just C Spine MRI. MRI order needs to be changed. They either do a C Spine MRI with and without contrast or just a C Spine MRI without. Please change the MRI order and choose [facility] as the location.

## 2019-11-08 NOTE — Telephone Encounter (Signed)
Changed.

## 2019-11-21 ENCOUNTER — Other Ambulatory Visit: Payer: Self-pay

## 2019-11-21 ENCOUNTER — Ambulatory Visit
Admission: RE | Admit: 2019-11-21 | Discharge: 2019-11-21 | Disposition: A | Discharge status: Home / Self Care | Payer: Medicare Other | Source: Ambulatory Visit | Attending: Family Medicine | Admitting: Family Medicine

## 2019-11-21 DIAGNOSIS — G3281 Cerebellar ataxia in diseases classified elsewhere: Secondary | ICD-10-CM | POA: Diagnosis not present

## 2019-11-21 DIAGNOSIS — G959 Disease of spinal cord, unspecified: Secondary | ICD-10-CM | POA: Diagnosis present

## 2019-11-21 MED ORDER — GADOBUTROL 1 MMOL/ML IV SOLN
7.0000 mL | Freq: Once | INTRAVENOUS | Status: AC | PRN
  Administered 2019-11-21: 7 mL via INTRAVENOUS

## 2019-11-22 ENCOUNTER — Encounter: Payer: Self-pay | Admitting: Family Medicine

## 2019-11-22 DIAGNOSIS — D492 Neoplasm of unspecified behavior of bone, soft tissue, and skin: Secondary | ICD-10-CM | POA: Insufficient documentation

## 2019-12-13 ENCOUNTER — Telehealth: Payer: Self-pay

## 2019-12-13 NOTE — Telephone Encounter (Signed)
Spoke with pt's wife, Inez Catalina (on dpr), relaying Dr. Synthia Innocent message.  Verbalizes understanding and will inform pt.

## 2019-12-13 NOTE — Telephone Encounter (Signed)
I sent Dr Lynford Humphrey all the information including latest MRI, didn't hear back. Encourage pt to call [facility]'s office to ensure he received MRI and see if he wants to see patient sooner.

## 2019-12-13 NOTE — Telephone Encounter (Signed)
Pt left v/m wanting to know if Dr Darnell Level and Dr Lynford Humphrey ever discussed MRI results. Pt request cb.

## 2019-12-14 ENCOUNTER — Telehealth: Payer: Self-pay | Admitting: Family Medicine

## 2019-12-14 DIAGNOSIS — D492 Neoplasm of unspecified behavior of bone, soft tissue, and skin: Secondary | ICD-10-CM

## 2019-12-14 MED ORDER — PREDNISONE 20 MG PO TABS: ORAL_TABLET | ORAL | 0 refills | Status: DC

## 2019-12-14 NOTE — Telephone Encounter (Signed)
[facility] Called in regards to the patient . Dr Melven Sartorius stated that the patient did not need an ortho evaluation after the MRI.  Patient needed to be referred over to neurology for a mass on his spine   [facility] # [phone]  If there are any further questions

## 2019-12-14 NOTE — Telephone Encounter (Signed)
Spoke with patient. Ongoing leg weakness most noticeable with prolonged sitting. No leg numbness.  Will Rx course of prednisone Will refer to neurosurgery.

## 2020-01-19 ENCOUNTER — Ambulatory Visit: Payer: Medicare Other | Admitting: Urology

## 2020-01-22 ENCOUNTER — Encounter: Payer: Self-pay | Admitting: Urology

## 2020-01-22 ENCOUNTER — Other Ambulatory Visit: Payer: Self-pay

## 2020-01-22 ENCOUNTER — Ambulatory Visit (INDEPENDENT_AMBULATORY_CARE_PROVIDER_SITE_OTHER): Payer: Medicare Other | Admitting: Urology

## 2020-01-22 VITALS — BP 138/82 | HR 88 | Ht 67.0 in | Wt 170.0 lb

## 2020-01-22 DIAGNOSIS — R339 Retention of urine, unspecified: Secondary | ICD-10-CM | POA: Diagnosis not present

## 2020-01-22 DIAGNOSIS — N401 Enlarged prostate with lower urinary tract symptoms: Secondary | ICD-10-CM

## 2020-01-22 DIAGNOSIS — N138 Other obstructive and reflux uropathy: Secondary | ICD-10-CM | POA: Diagnosis not present

## 2020-01-22 LAB — BLADDER SCAN AMB NON-IMAGING

## 2020-01-22 MED ORDER — DUTASTERIDE 0.5 MG PO CAPS: 0.5000 mg | ORAL_CAPSULE | Freq: Every day | ORAL | 3 refills | Status: DC

## 2020-01-22 NOTE — Progress Notes (Signed)
01/22/2020 9:31 AM   Mark Holmes 1935-07-15 [id number]  Referring provider: Ria Bush, MD [address] [place],  [place] [address]  Chief Complaint  Patient presents with  . Follow-up    HPI: Patient saw Dr. Chauncey Cruel 1 year ago for voiding dysfunction and had a TURP in 2018.  A prior biopsy for elevated PSA was normal.  In July 2019 PSA was 0.8.  BSA was 4.552 years ago and in July 2016 was 0.51  His main complaint is slow flow and he presses down with his abdominal muscles to strain.  He gets up 3 times a night.  He has no ankle edema.  He voids infrequently during the day.  He is still on Avodart.  Clinically not infected  50 g benign prostate with hemorrhoid and mild anal stenosis  PMH: Past Medical History:  Diagnosis Date  . Benign prostatic hypertrophy with elevated PSA    per Dr. Jacqlyn Larsen  . History of CT scan of brain 1992   normal  . Hyperglycemia 01/2006   89  . Hyperlipemia   . Hypertension   . Seizure disorder [facility])     Surgical History: Past Surgical History:  Procedure Laterality Date  . ANTERIOR CERVICAL DECOMP/DISCECTOMY FUSION  05/2018   for myelopathy -C3/4 ([facility] @ [facility])  . CATARACT EXTRACTION W/ INTRAOCULAR LENS IMPLANT Right 2012   [facility]  . History of EEG  1985   normal  . hospitalization  1981   observation for seizuare  . TONSILLECTOMY    . TRANSURETHRAL RESECTION OF PROSTATE  10/2005   [name]  . TRANSURETHRAL RESECTION OF PROSTATE  08/2017   (rpt) [facility]    Home Medications:  Allergies as of 01/22/2020      Reactions   Dilaudid [hydromorphone Hcl] Other (See Comments)   hallucinations      Medication List       Accurate as of January 22, 2020  9:31 AM. If you have any questions, ask your nurse or doctor.        aspirin-acetaminophen-caffeine 250-250-65 MG tablet Commonly known as: EXCEDRIN MIGRAINE Take 1 tablet by mouth 2 (two) times daily as needed.   B-12 500 MCG Tabs Take 1 tablet by mouth  every Monday, Wednesday, and Friday.   dutasteride 0.5 MG capsule Commonly known as: AVODART Take 1 capsule (0.5 mg total) by mouth daily.   fish oil-omega-3 fatty acids 1000 MG capsule Take 2 g by mouth daily. As directed   hydrochlorothiazide 12.5 MG capsule Commonly known as: MICROZIDE TAKE ONE CAPSULE BY MOUTH EVERY MORNING   nitroGLYCERIN 0.4 MG SL tablet Commonly known as: NITROSTAT Place 1 tablet (0.4 mg total) under the tongue every 5 (five) minutes as needed for chest pain.   oxyCODONE 5 MG immediate release tablet Commonly known as: Oxy IR/ROXICODONE oxycodone 5 mg tablet   phenytoin 100 MG ER capsule Commonly known as: DILANTIN Take 2 capsules (200mg ) in the morning and 3 (300mg ) at night   predniSONE 20 MG tablet Commonly known as: DELTASONE Take two tablets daily for 4 days followed by one tablet daily for 4 days   ramipril 10 MG capsule Commonly known as: Altace Take 1 capsule (10 mg total) by mouth daily.   terazosin 10 MG capsule Commonly known as: HYTRIN Take 1 capsule (10 mg total) by mouth daily.   VITAMIN B-6 PO Take 100 mg by mouth daily.   Vitamin D3 25 MCG (1000 UT) Caps Take by mouth daily.  Allergies:  Allergies  Allergen Reactions  . Dilaudid [Hydromorphone Hcl] Other (See Comments)    hallucinations    Family History: Family History  Problem Relation Age of Onset  . Febrile seizures Sister   . Cancer Sister        lung/smoker  . CAD Neg Hx     Social History:  reports that he has never smoked. He has never used smokeless tobacco. He reports that he does not drink alcohol or use drugs.  ROS: UROLOGY Frequent Urination?: Yes Hard to postpone urination?: No Burning/pain with urination?: No Get up at night to urinate?: No Leakage of urine?: No Urine stream starts and stops?: No Trouble starting stream?: Yes Do you have to strain to urinate?: Yes Blood in urine?: No Urinary tract infection?: No Sexually transmitted  disease?: No Injury to kidneys or bladder?: No Painful intercourse?: No Weak stream?: No Erection problems?: No Penile pain?: No  Gastrointestinal Nausea?: No Vomiting?: No Indigestion/heartburn?: No Diarrhea?: No Constipation?: No  Constitutional Fever: No Night sweats?: No Weight loss?: No Fatigue?: No  Skin Skin rash/lesions?: No Itching?: No  Eyes Blurred vision?: No Double vision?: No  Ears/Nose/Throat Sore throat?: No Sinus problems?: No  Hematologic/Lymphatic Swollen glands?: No Easy bruising?: No  Cardiovascular Leg swelling?: No Chest pain?: No  Respiratory Cough?: No Shortness of breath?: No  Endocrine Excessive thirst?: No  Musculoskeletal Back pain?: Yes Joint pain?: No  Neurological Headaches?: No Dizziness?: No  Psychologic Depression?: No Anxiety?: No  Physical Exam: BP 138/82   Pulse 88   Ht 5\' 7"  (1.702 m)   Wt 170 lb (77.1 kg)   BMI 26.63 kg/m     Laboratory Data: Lab Results  Component Value Date   WBC 8.6 11/07/2019   HGB 13.0 11/07/2019   HCT 37.6 (L) 11/07/2019   MCV 87.2 11/07/2019   PLT 289.0 11/07/2019    Lab Results  Component Value Date   CREATININE 1.16 11/07/2019    Lab Results  Component Value Date   PSA 0.51 07/06/2019   PSA 0.81 07/01/2018   PSA 4.55 (H) 06/28/2017    No results found for: TESTOSTERONE  No results found for: HGBA1C  Urinalysis    Component Value Date/Time   APPEARANCEUR Clear 01/17/2019 1320   GLUCOSEU Negative 01/17/2019 1320   BILIRUBINUR Negative 01/17/2019 1320   PROTEINUR Negative 01/17/2019 1320   NITRITE Negative 01/17/2019 1320   LEUKOCYTESUR Negative 01/17/2019 1320    Pertinent Imaging:   Assessment & Plan: Patient will continue to get his PSA by primary care though we talked about its usefulness in his age group.  I will represcribe the Avodart.  There is really nothing else that we can do for his poor flow and unlocked in order urodynamics.  He said  2 prostate procedures over the years.  We talked about the use of an overactive bladder medication for nighttime frequency but it could theoretically worsen his flow  Avodart 90 tablets and 3 refills sent and see in 1 year.  Unfortunately the patient lost his dog Johnny in November  1. Benign prostatic hyperplasia with urinary obstruction  - Bladder Scan (Post Void Residual) in office   No follow-ups on file.  Reece Packer, MD  [facility] [address] [place], [place] [address] [phone]

## 2020-02-14 ENCOUNTER — Encounter: Payer: Self-pay | Admitting: Family Medicine

## 2020-02-19 ENCOUNTER — Other Ambulatory Visit: Payer: Self-pay | Admitting: Family Medicine

## 2020-04-11 ENCOUNTER — Other Ambulatory Visit: Payer: Self-pay | Admitting: Family Medicine

## 2020-04-11 NOTE — Telephone Encounter (Signed)
Patient called today to requesting a new script. He stated that he is now using [facility] for his prescriptions and he is needing phenytoin (DILANTIN) 100 MG ER capsule sent to them  He was not sure if they sent over a requests for this so he wanted to check in Patient would like a call back once this has been sent

## 2020-04-11 NOTE — Telephone Encounter (Signed)
Dilantin Last rx:  07/10/19, #450 Last OV:  11/07/19, acute carpal tunnel, knee weakness Next OV:  07/11/20, AWV prt 2

## 2020-04-14 MED ORDER — PHENYTOIN SODIUM EXTENDED 100 MG PO CAPS: ORAL_CAPSULE | ORAL | 3 refills | Status: DC

## 2020-04-14 NOTE — Telephone Encounter (Signed)
plz notify this was sent in. 

## 2020-04-15 NOTE — Telephone Encounter (Signed)
Pt notified as instructed, by phn.  Expresses his thanks.

## 2020-05-22 ENCOUNTER — Other Ambulatory Visit: Payer: Self-pay | Admitting: Family Medicine

## 2020-05-31 ENCOUNTER — Telehealth: Payer: Self-pay

## 2020-05-31 NOTE — Telephone Encounter (Signed)
Pt called the office wanting to know if he could come up to the office to get another aircast that was given to him from our office about 6 months ago for right ankle... Being that it has been awhile, I wanted to make sure you did not want to have pt come into the office to follow up or if this was approriate...Marland Kitchen please advise

## 2020-05-31 NOTE — Telephone Encounter (Addendum)
He can come if desired for aircast ankle brace.  Don't know if insurance will cover or he will need to pay for it.  Indication - chronic ankle pain M25.579, G89.29.

## 2020-06-03 NOTE — Telephone Encounter (Signed)
Pt left v/m requesting cb about getting another air cast for ankle; pt's v/m said the last air cast has fallen apart and he is going to [place] on 06/05/20 and would like to pick up air cast by 06/04/20.

## 2020-06-04 NOTE — Telephone Encounter (Signed)
See my message from 4 days ago in this phone note.

## 2020-06-04 NOTE — Telephone Encounter (Addendum)
Spoke with pt relaying Dr. Synthia Innocent message.  Pt verbalizes understanding and wants to come by office today to pick up Aircast ankle brace.    After some research, we will not be able to provide pt the Aircast.  Dr. Darnell Level has written an rx for pt to take to a pharmacy to pick up cast.  Tried to call pt to notify him, no answer.

## 2020-06-04 NOTE — Telephone Encounter (Addendum)
Pt came by office to pick up Aircast.  Explained that we would due to his date of service being last year.  And that is the reason a written rx is being provided.  However, pt declines to take the rx at this time due to him leaving going out of town tomorrow.

## 2020-07-02 ENCOUNTER — Other Ambulatory Visit: Payer: Self-pay | Admitting: Family Medicine

## 2020-07-02 DIAGNOSIS — E559 Vitamin D deficiency, unspecified: Secondary | ICD-10-CM

## 2020-07-02 DIAGNOSIS — E785 Hyperlipidemia, unspecified: Secondary | ICD-10-CM

## 2020-07-02 DIAGNOSIS — N138 Other obstructive and reflux uropathy: Secondary | ICD-10-CM

## 2020-07-04 ENCOUNTER — Other Ambulatory Visit (INDEPENDENT_AMBULATORY_CARE_PROVIDER_SITE_OTHER): Payer: Medicare Other

## 2020-07-04 ENCOUNTER — Other Ambulatory Visit: Payer: Self-pay

## 2020-07-04 DIAGNOSIS — E785 Hyperlipidemia, unspecified: Secondary | ICD-10-CM | POA: Diagnosis not present

## 2020-07-04 DIAGNOSIS — N401 Enlarged prostate with lower urinary tract symptoms: Secondary | ICD-10-CM | POA: Diagnosis not present

## 2020-07-04 DIAGNOSIS — E559 Vitamin D deficiency, unspecified: Secondary | ICD-10-CM | POA: Diagnosis not present

## 2020-07-04 DIAGNOSIS — N138 Other obstructive and reflux uropathy: Secondary | ICD-10-CM

## 2020-07-04 LAB — LIPID PANEL
Cholesterol: 182 mg/dL (ref 0–200)
HDL: 38.7 mg/dL — ABNORMAL LOW (ref >39.00)
LDL Cholesterol: 109 mg/dL — ABNORMAL HIGH (ref 0–99)
NonHDL: 143.51
Total CHOL/HDL Ratio: 5
Triglycerides: 175 mg/dL — ABNORMAL HIGH (ref 0.0–149.0)
VLDL: 35 mg/dL (ref 0.0–40.0)

## 2020-07-04 LAB — COMPREHENSIVE METABOLIC PANEL
ALT: 20 U/L (ref 0–53)
AST: 18 U/L (ref 0–37)
Albumin: 4.2 g/dL (ref 3.5–5.2)
Alkaline Phosphatase: 99 U/L (ref 39–117)
BUN: 24 mg/dL — ABNORMAL HIGH (ref 6–23)
CO2: 29 mEq/L (ref 19–32)
Calcium: 9.2 mg/dL (ref 8.4–10.5)
Chloride: 102 mEq/L (ref 96–112)
Creatinine, Ser: 1.17 mg/dL (ref 0.40–1.50)
GFR: 59.58 mL/min — ABNORMAL LOW (ref >60.00)
Glucose, Bld: 98 mg/dL (ref 70–99)
Potassium: 4 mEq/L (ref 3.5–5.1)
Sodium: 139 mEq/L (ref 135–145)
Total Bilirubin: 0.4 mg/dL (ref 0.2–1.2)
Total Protein: 6.4 g/dL (ref 6.0–8.3)

## 2020-07-04 LAB — PSA: PSA: 0.62 ng/mL (ref 0.10–4.00)

## 2020-07-04 LAB — VITAMIN D 25 HYDROXY (VIT D DEFICIENCY, FRACTURES): VITD: 38.05 ng/mL (ref 30.00–100.00)

## 2020-07-10 ENCOUNTER — Telehealth: Payer: Self-pay | Admitting: *Deleted

## 2020-07-10 ENCOUNTER — Emergency Department: Payer: Medicare Other

## 2020-07-10 ENCOUNTER — Observation Stay: Payer: Medicare Other

## 2020-07-10 ENCOUNTER — Other Ambulatory Visit: Payer: Self-pay

## 2020-07-10 ENCOUNTER — Encounter: Payer: Self-pay | Admitting: Emergency Medicine

## 2020-07-10 ENCOUNTER — Observation Stay
Admission: EM | Admit: 2020-07-10 | Discharge: 2020-07-11 | Disposition: A | Discharge status: Home / Self Care | Payer: Medicare Other | Attending: Internal Medicine | Admitting: Internal Medicine

## 2020-07-10 DIAGNOSIS — R0789 Other chest pain: Secondary | ICD-10-CM | POA: Diagnosis not present

## 2020-07-10 DIAGNOSIS — Z79899 Other long term (current) drug therapy: Secondary | ICD-10-CM | POA: Diagnosis not present

## 2020-07-10 DIAGNOSIS — R011 Cardiac murmur, unspecified: Secondary | ICD-10-CM | POA: Diagnosis present

## 2020-07-10 DIAGNOSIS — R270 Ataxia, unspecified: Secondary | ICD-10-CM | POA: Diagnosis not present

## 2020-07-10 DIAGNOSIS — R600 Localized edema: Secondary | ICD-10-CM | POA: Diagnosis not present

## 2020-07-10 DIAGNOSIS — R079 Chest pain, unspecified: Secondary | ICD-10-CM

## 2020-07-10 DIAGNOSIS — N401 Enlarged prostate with lower urinary tract symptoms: Secondary | ICD-10-CM | POA: Diagnosis not present

## 2020-07-10 DIAGNOSIS — Z7982 Long term (current) use of aspirin: Secondary | ICD-10-CM | POA: Insufficient documentation

## 2020-07-10 DIAGNOSIS — G40909 Epilepsy, unspecified, not intractable, without status epilepticus: Secondary | ICD-10-CM

## 2020-07-10 DIAGNOSIS — Z20822 Contact with and (suspected) exposure to covid-19: Secondary | ICD-10-CM | POA: Diagnosis not present

## 2020-07-10 DIAGNOSIS — N138 Other obstructive and reflux uropathy: Secondary | ICD-10-CM | POA: Diagnosis present

## 2020-07-10 DIAGNOSIS — I10 Essential (primary) hypertension: Secondary | ICD-10-CM | POA: Diagnosis not present

## 2020-07-10 DIAGNOSIS — R2681 Unsteadiness on feet: Secondary | ICD-10-CM

## 2020-07-10 DIAGNOSIS — E782 Mixed hyperlipidemia: Secondary | ICD-10-CM | POA: Diagnosis present

## 2020-07-10 DIAGNOSIS — N400 Enlarged prostate without lower urinary tract symptoms: Secondary | ICD-10-CM | POA: Diagnosis present

## 2020-07-10 DIAGNOSIS — R0600 Dyspnea, unspecified: Secondary | ICD-10-CM

## 2020-07-10 DIAGNOSIS — E785 Hyperlipidemia, unspecified: Secondary | ICD-10-CM | POA: Diagnosis present

## 2020-07-10 DIAGNOSIS — R0609 Other forms of dyspnea: Secondary | ICD-10-CM

## 2020-07-10 DIAGNOSIS — R069 Unspecified abnormalities of breathing: Secondary | ICD-10-CM | POA: Diagnosis not present

## 2020-07-10 LAB — BASIC METABOLIC PANEL
Anion gap: 8 (ref 5–15)
BUN: 27 mg/dL — ABNORMAL HIGH (ref 8–23)
CO2: 26 mmol/L (ref 22–32)
Calcium: 9.2 mg/dL (ref 8.9–10.3)
Chloride: 105 mmol/L (ref 98–111)
Creatinine, Ser: 1.17 mg/dL (ref 0.61–1.24)
GFR calc Af Amer: >60 mL/min (ref >60)
GFR calc non Af Amer: 58 mL/min — ABNORMAL LOW (ref >60)
Glucose, Bld: 97 mg/dL (ref 70–99)
Potassium: 3.9 mmol/L (ref 3.5–5.1)
Sodium: 139 mmol/L (ref 135–145)

## 2020-07-10 LAB — CBC
HCT: 34 % — ABNORMAL LOW (ref 39.0–52.0)
Hemoglobin: 11.6 g/dL — ABNORMAL LOW (ref 13.0–17.0)
MCH: 29.8 pg (ref 26.0–34.0)
MCHC: 34.1 g/dL (ref 30.0–36.0)
MCV: 87.4 fL (ref 80.0–100.0)
Platelets: 236 10*3/uL (ref 150–400)
RBC: 3.89 MIL/uL — ABNORMAL LOW (ref 4.22–5.81)
RDW: 13.6 % (ref 11.5–15.5)
WBC: 7.1 10*3/uL (ref 4.0–10.5)
nRBC: 0 % (ref 0.0–0.2)

## 2020-07-10 LAB — HEPATIC FUNCTION PANEL
ALT: 18 U/L (ref 0–44)
AST: 20 U/L (ref 15–41)
Albumin: 3.9 g/dL (ref 3.5–5.0)
Alkaline Phosphatase: 90 U/L (ref 38–126)
Bilirubin, Direct: <0.1 mg/dL (ref 0.0–0.2)
Total Bilirubin: 0.7 mg/dL (ref 0.3–1.2)
Total Protein: 6.7 g/dL (ref 6.5–8.1)

## 2020-07-10 LAB — SARS CORONAVIRUS 2 BY RT PCR (HOSPITAL ORDER, PERFORMED IN ~~LOC~~ HOSPITAL LAB): SARS Coronavirus 2: NEGATIVE

## 2020-07-10 LAB — TROPONIN I (HIGH SENSITIVITY)
Troponin I (High Sensitivity): 4 ng/L (ref <18)
Troponin I (High Sensitivity): 3 ng/L (ref <18)

## 2020-07-10 LAB — BRAIN NATRIURETIC PEPTIDE: B Natriuretic Peptide: 42.8 pg/mL (ref 0.0–100.0)

## 2020-07-10 LAB — FIBRIN DERIVATIVES D-DIMER (ARMC ONLY): Fibrin derivatives D-dimer (ARMC): 751.97 ng/mL (FEU) — ABNORMAL HIGH (ref 0.00–499.00)

## 2020-07-10 MED ORDER — DUTASTERIDE 0.5 MG PO CAPS
0.5000 mg | ORAL_CAPSULE | Freq: Every day | ORAL | Status: DC
  Administered 2020-07-11: 0.5 mg via ORAL
  Filled 2020-07-10 (×3): qty 1

## 2020-07-10 MED ORDER — DOCUSATE SODIUM 100 MG PO CAPS
100.0000 mg | ORAL_CAPSULE | Freq: Two times a day (BID) | ORAL | Status: DC
  Administered 2020-07-10 – 2020-07-11 (×2): 100 mg via ORAL
  Filled 2020-07-10 (×3): qty 1

## 2020-07-10 MED ORDER — ONDANSETRON HCL 4 MG PO TABS: 4.0000 mg | ORAL_TABLET | Freq: Four times a day (QID) | ORAL | Status: DC | PRN

## 2020-07-10 MED ORDER — SODIUM CHLORIDE 0.9% FLUSH: 3.0000 mL | INTRAVENOUS | Status: DC | PRN

## 2020-07-10 MED ORDER — SODIUM CHLORIDE 0.9 % IV SOLN: 250.0000 mL | INTRAVENOUS | Status: DC | PRN

## 2020-07-10 MED ORDER — ACETAMINOPHEN 650 MG RE SUPP: 650.0000 mg | Freq: Four times a day (QID) | RECTAL | Status: DC | PRN

## 2020-07-10 MED ORDER — SODIUM CHLORIDE 0.9% FLUSH
3.0000 mL | Freq: Two times a day (BID) | INTRAVENOUS | Status: DC
  Administered 2020-07-10 – 2020-07-11 (×2): 3 mL via INTRAVENOUS

## 2020-07-10 MED ORDER — PHENYTOIN SODIUM EXTENDED 100 MG PO CAPS
300.0000 mg | ORAL_CAPSULE | Freq: Every day | ORAL | Status: DC
  Administered 2020-07-10: 300 mg via ORAL
  Filled 2020-07-10 (×2): qty 3

## 2020-07-10 MED ORDER — ENOXAPARIN SODIUM 40 MG/0.4ML ~~LOC~~ SOLN
40.0000 mg | SUBCUTANEOUS | Status: DC
  Administered 2020-07-10: 40 mg via SUBCUTANEOUS
  Filled 2020-07-10 (×2): qty 0.4

## 2020-07-10 MED ORDER — PHENYTOIN SODIUM EXTENDED 100 MG PO CAPS
200.0000 mg | ORAL_CAPSULE | Freq: Every day | ORAL | Status: DC
  Administered 2020-07-11: 11:00:00 200 mg via ORAL
  Filled 2020-07-10 (×2): qty 2

## 2020-07-10 MED ORDER — ALUM & MAG HYDROXIDE-SIMETH 200-200-20 MG/5ML PO SUSP
30.0000 mL | Freq: Once | ORAL | Status: AC
  Administered 2020-07-10: 30 mL via ORAL
  Filled 2020-07-10: qty 30

## 2020-07-10 MED ORDER — ATORVASTATIN CALCIUM 20 MG PO TABS
40.0000 mg | ORAL_TABLET | Freq: Every day | ORAL | Status: DC
  Administered 2020-07-11: 40 mg via ORAL
  Filled 2020-07-10: qty 2

## 2020-07-10 MED ORDER — PHENYTOIN SODIUM EXTENDED 100 MG PO CAPS
200.0000 mg | ORAL_CAPSULE | Freq: Two times a day (BID) | ORAL | Status: DC
  Filled 2020-07-10 (×2): qty 3

## 2020-07-10 MED ORDER — RAMIPRIL 10 MG PO CAPS
10.0000 mg | ORAL_CAPSULE | Freq: Every day | ORAL | Status: DC
  Administered 2020-07-11: 11:00:00 10 mg via ORAL
  Filled 2020-07-10 (×3): qty 1

## 2020-07-10 MED ORDER — MORPHINE SULFATE (PF) 2 MG/ML IV SOLN
1.0000 mg | INTRAVENOUS | Status: DC | PRN
  Administered 2020-07-11: 03:00:00 1 mg via INTRAVENOUS
  Filled 2020-07-10: qty 1

## 2020-07-10 MED ORDER — DUTASTERIDE 0.5 MG PO CAPS: 0.5000 mg | ORAL_CAPSULE | Freq: Every day | ORAL | Status: DC

## 2020-07-10 MED ORDER — ONDANSETRON HCL 4 MG/2ML IJ SOLN: 4.0000 mg | Freq: Four times a day (QID) | INTRAMUSCULAR | Status: DC | PRN

## 2020-07-10 MED ORDER — IOHEXOL 350 MG/ML SOLN
75.0000 mL | Freq: Once | INTRAVENOUS | Status: AC | PRN
  Administered 2020-07-10: 75 mL via INTRAVENOUS
  Filled 2020-07-10: qty 75

## 2020-07-10 MED ORDER — TERAZOSIN HCL 5 MG PO CAPS
10.0000 mg | ORAL_CAPSULE | Freq: Every day | ORAL | Status: DC
  Administered 2020-07-11: 10 mg via ORAL
  Filled 2020-07-10 (×2): qty 2

## 2020-07-10 MED ORDER — PANTOPRAZOLE SODIUM 40 MG PO TBEC
40.0000 mg | DELAYED_RELEASE_TABLET | Freq: Every day | ORAL | Status: DC
  Administered 2020-07-11: 13:00:00 40 mg via ORAL
  Filled 2020-07-10: qty 1

## 2020-07-10 MED ORDER — ACETAMINOPHEN 325 MG PO TABS
650.0000 mg | ORAL_TABLET | Freq: Four times a day (QID) | ORAL | Status: DC | PRN
  Administered 2020-07-11: 02:00:00 650 mg via ORAL
  Filled 2020-07-10: qty 2

## 2020-07-10 NOTE — H&P (Signed)
Mark Holmes LKT:[id number] DOB: 1935-03-21 DOA: 07/10/2020     PCP: Ria Bush, MD   Outpatient Specialists:       NEurology  Arvil Chaco, DO [facility]   Patient arrived to ER on 07/10/20 at 1107 Referred by Attending Nena Polio, MD   Patient coming from: home Lives  With family    Chief Complaint:   Chief Complaint  Patient presents with  . Leg Swelling  . Chest Pain    HPI: Mark Holmes is a 85 y.o. 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  He was seen by Neurology fo r routine visit and mentioned CP. Neurology was concerned and rec for patient to come to ER for further cardiac work up  Pain is on the left side of the chest or central non pleuritic, worse when he puts his head forward  SOB progressing over past weeks. No exertional CP    Infectious risk factors:  Reports  shortness of breath,   chest pain,  Has been fully vaccinated against COVID    Initial COVID TEST  NEGATIVE   Lab Results  Component Value Date   [place] NEGATIVE 07/10/2020     Regarding pertinent Chronic problems:     Hyperlipidemia -  Not on statins Lipid Panel     Component Value Date/Time   CHOL 182 07/04/2020 0755   TRIG 175.0 (H) 07/04/2020 0755   HDL 38.70 (L) 07/04/2020 0755   CHOLHDL 5 07/04/2020 0755   VLDL 35.0 07/04/2020 0755   LDLCALC 109 (H) 07/04/2020 0755   LDLDIRECT 147.4 02/22/2008 1144     HTN on HCTZ, ramipril, Hytrin   Seizure Do on Dilantin      BPH - on Avodart?      While in ER: CXR unremarkable ECG unchanged trop wnl   ER Provider Called: cardiology    Dr.McLean  They Recommend admit to medicine   Will see in AM ,cycle CE  Hospitalist was called for  admission for Chest Pain evaluation   The following Work up has been ordered so far:  Orders Placed This Encounter  Procedures  . SARS Coronavirus 2 by RT PCR (hospital order, performed in [facility] hospital lab) Nasopharyngeal Nasopharyngeal Swab  . DG Chest 2 View  . Basic metabolic panel  . CBC  . Brain natriuretic peptide  . Hepatic function panel  . Cardiac monitoring  . Document Height and Actual Weight  . Saline Lock IV, Maintain IV access  . Consult to hospitalist  ALL PATIENTS BEING ADMITTED/HAVING PROCEDURES NEED COVID-19 SCREENING discussed patient with cardiologist, Dr. Aundra Dubin.  He recommends inpatient hobs for chest pain evaluation as the patient is 85 years old.  . Pulse oximetry, continuous  . EKG 12-Lead  . ED EKG     Following Medications were ordered in ER: Medications - No data to display      Consult Orders  (From admission, onward)         Start     Ordered   07/10/20 1751  Consult to hospitalist  ALL PATIENTS BEING ADMITTED/HAVING PROCEDURES NEED COVID-19 SCREENING discussed patient with cardiologist, Dr. Aundra Dubin.  He recommends inpatient hobs for chest pain evaluation as the patient  is 85 years old.  Once       Comments: ALL PATIENTS BEING ADMITTED/HAVING PROCEDURES NEED COVID-19 SCREENING discussed patient with cardiologist, Dr. Aundra Dubin.  He recommends inpatient hobs for chest pain evaluation as the patient is 85 years old.  Provider:  (Not yet assigned)  Question Answer Comment  Place call to: Hospitalist   Reason for Consult Admit   Diagnosis/Clinical Info for Consult: Sent from neurologist office with diagnosis of unstable angina      07/10/20 1751           Significant initial  Findings: Abnormal Labs Reviewed  BASIC METABOLIC PANEL - Abnormal; Notable for the following components:      Result Value   BUN 27 (*)    GFR calc non Af Amer 58 (*)    All other components within normal limits  CBC - Abnormal; Notable for the following  components:   RBC 3.89 (*)    Hemoglobin 11.6 (*)    HCT 34.0 (*)    All other components within normal limits  FIBRIN DERIVATIVES D-DIMER ([facility] ONLY) - Abnormal; Notable for the following components:   Fibrin derivatives D-dimer [facility]) 751.97 (*)    All other components within normal limits    Otherwise labs showing:    Recent Labs  Lab 07/04/20 0755 07/10/20 1152  NA 139 139  K 4.0 3.9  CO2 29 26  GLUCOSE 98 97  BUN 24* 27*  CREATININE 1.17 1.17  CALCIUM 9.2 9.2    Cr    Stable,  Lab Results  Component Value Date   CREATININE 1.17 07/10/2020   CREATININE 1.17 07/04/2020   CREATININE 1.16 11/07/2019    Recent Labs  Lab 07/04/20 0755  AST 18  ALT 20  ALKPHOS 99  BILITOT 0.4  PROT 6.4  ALBUMIN 4.2   Lab Results  Component Value Date   CALCIUM 9.2 07/10/2020   PHOS 2.8 05/11/2011     WBC      Component Value Date/Time   WBC 7.1 07/10/2020 1152   ANC    Component Value Date/Time   NEUTROABS 6.2 11/07/2019 1159   ALC No components found for: LYMPHAB    Plt: Lab Results  Component Value Date   PLT 236 07/10/2020      HG/HCT  stable,      Component Value Date/Time   HGB 11.6 (L) 07/10/2020 1152   HCT 34.0 (L) 07/10/2020 1152    No results for input(s): LIPASE, AMYLASE in the last 168 hours. No results for input(s): AMMONIA in the last 168 hours.    Troponin 4 - 3     ECG: Ordered Personally reviewed by me showing: HR :63 Rhythm:  NSR,   nonspecific T wave flattening unchanged from prior  QTC 473   BNP (last 3 results) Recent Labs    07/10/20 1152  BNP 42.8     UA ordered      Ordered     CXR -  NON acute   CTA chest -  nonacute, no PE, bilateral atelectasis although could not exclude atypical infection Bilateral lower extremity ultrasound negative    ED Triage Vitals  Enc Vitals Group     BP 07/10/20 1139 118/62     Pulse Rate 07/10/20 1139 67     Resp 07/10/20 1139 18     Temp 07/10/20 1139 99 F (37.2 C)      Temp Source 07/10/20 1139 Oral     SpO2 07/10/20 1139 96 %  Weight 07/10/20 1146 169 lb 15.6 oz (77.1 kg)     Height 07/10/20 1146 5\' 7"  (1.702 m)     Head Circumference --      Peak Flow --      Pain Score 07/10/20 1139 3     Pain Loc --      Pain Edu? --      Excl. in [place]? --   TMAX(24)@       Latest  Blood pressure (!) 157/82, pulse 67, temperature 99 F (37.2 C), temperature source Oral, resp. rate 16, height 5\' 7"  (1.702 m), weight 77.1 kg, SpO2 97 %.      Review of Systems:    Pertinent positives include:   chest pain  shortness of breath at rest.  Bilateral lower extremity swelling  Constitutional:  No weight loss, night sweats, Fevers, chills, fatigue, weight loss  HEENT:  No headaches, Difficulty swallowing,Tooth/dental problems,Sore throat,  No sneezing, itching, ear ache, nasal congestion, post nasal drip,  Cardio-vascular:  No , Orthopnea, PND, anasarca, dizziness, palpitations.no GI:  No heartburn, indigestion, abdominal pain, nausea, vomiting, diarrhea, change in bowel habits, loss of appetite, melena, blood in stool, hematemesis Resp:  no No dyspnea on exertion, No excess mucus, no productive cough, No non-productive cough, No coughing up of blood.No change in color of mucus.No wheezing. Skin:  no rash or lesions. No jaundice GU:  no dysuria, change in color of urine, no urgency or frequency. No straining to urinate.  No flank pain.  Musculoskeletal:  No joint pain or no joint swelling. No decreased range of motion. No back pain.  Psych:  No change in mood or affect. No depression or anxiety. No memory loss.  Neuro: no localizing neurological complaints, no tingling, no weakness, no double vision, no gait abnormality, no slurred speech, no confusion  All systems reviewed and apart from [place] all are negative  Past Medical History:   Past Medical History:  Diagnosis Date  . Benign prostatic hypertrophy with elevated PSA    per Dr. Jacqlyn Larsen  . History of  CT scan of brain 1992   normal  . Hyperglycemia 01/2006   102  . Hyperlipemia   . Hypertension   . Seizure disorder [facility])       Past Surgical History:  Procedure Laterality Date  . ANTERIOR CERVICAL DECOMP/DISCECTOMY FUSION  05/2018   for myelopathy -C3/4 ([place] @ [facility])  . CARPAL TUNNEL RELEASE Right 06/2019   ([facility])  . CATARACT EXTRACTION W/ INTRAOCULAR LENS IMPLANT Right 2012   [facility]  . History of EEG  1985   normal  . hospitalization  1981   observation for seizuare  . TONSILLECTOMY    . TRANSURETHRAL RESECTION OF PROSTATE  10/2005   [name]  . TRANSURETHRAL RESECTION OF PROSTATE  08/2017   (rpt) [place]    Social History:  Ambulatory   independently       reports that he has never smoked. He has never used smokeless tobacco. He reports that he does not drink alcohol and does not use drugs.     Family History:   Family History  Problem Relation Age of Onset  . Febrile seizures Sister   . Cancer Sister        lung/smoker  . CAD Neg Hx     Allergies: Allergies  Allergen Reactions  . Dilaudid [Hydromorphone Hcl] Other (See Comments)    hallucinations     Prior to Admission medications   Medication Sig Start Date End  Date Taking? Authorizing Provider  aspirin-acetaminophen-caffeine (EXCEDRIN MIGRAINE) [phone] MG per tablet Take 1 tablet by mouth 2 (two) times daily as needed.     [provider]  docusate sodium (DOK) 100 MG capsule Take 100 mg by mouth 2 (two) times daily.    [provider]  dutasteride (AVODART) 0.5 MG capsule Take 1 capsule (0.5 mg total) by mouth daily. 01/22/20   MacDiarmid, Nicki Reaper, MD  hydrochlorothiazide (MICROZIDE) 12.5 MG capsule TAKE 1 CAPSULE BY MOUTH  EVERY MORNING Patient taking differently: Take 12.5 mg by mouth daily. TAKE 1 CAPSULE BY MOUTH  EVERY MORNING 05/23/20   Ria Bush, MD  phenytoin (DILANTIN) 100 MG ER capsule Take 2 capsules (200mg ) in the morning and 3 (300mg ) at night  04/14/20   Ria Bush, MD  Pyridoxine HCl (VITAMIN B-6 PO) Take 100 mg by mouth daily.    [provider]  ramipril (ALTACE) 10 MG capsule TAKE 1 CAPSULE BY MOUTH  DAILY Patient taking differently: Take 10 mg by mouth daily.  05/23/20   Ria Bush, MD  terazosin (HYTRIN) 10 MG capsule TAKE 1 CAPSULE BY MOUTH  DAILY Patient taking differently: Take 10 mg by mouth daily at 6 (six) AM.  05/23/20   Ria Bush, MD   Physical Exam: Vitals with BMI 07/10/2020 07/10/2020 07/10/2020  Height - 5\' 7"  -  Weight - 170 lbs -  BMI - 54.09 -  Systolic 811 - 914  Diastolic 82 - 62  Pulse 67 - 67     1. General:  in No  Acute distress   Chronically ill  -appearing 2. Psychological: Alert and  Oriented 3. Head/ENT:    Dry Mucous Membranes                          Head Non traumatic, neck supple                           Poor Dentition 4. SKIN: normal   Skin turgor,  Skin clean Dry and intact no rash 5. Heart: Regular rate and rhythm  systolic Murmur, no Rub or gallop 6. Lungs:  no wheezes or crackles   7. Abdomen: Soft, non-tender, Non distended  bowel sounds present 8. Lower extremities: no clubbing, cyanosis,   Trace edema 9. Neurologically Grossly intact, moving all 4 extremities equally  10. MSK: Normal range of motion   All other LABS:     Recent Labs  Lab 07/10/20 1152  WBC 7.1  HGB 11.6*  HCT 34.0*  MCV 87.4  PLT 236     Recent Labs  Lab 07/04/20 0755 07/10/20 1152  NA 139 139  K 4.0 3.9  CL 102 105  CO2 29 26  GLUCOSE 98 97  BUN 24* 27*  CREATININE 1.17 1.17  CALCIUM 9.2 9.2     Recent Labs  Lab 07/04/20 0755  AST 18  ALT 20  ALKPHOS 99  BILITOT 0.4  PROT 6.4  ALBUMIN 4.2       Cultures: No results found for: SDES, SPECREQUEST, CULT, REPTSTATUS   Radiological Exams on Admission: DG Chest 2 View  Result Date: 07/10/2020 CLINICAL DATA:  Chest pain EXAM: CHEST - 2 VIEW COMPARISON:  None. FINDINGS: Cardiomediastinal silhouette within  normal limits. Aortic arch atherosclerotic calcifications. Mildly hypoinflated lungs. Minimal left basilar atelectasis. No pneumothorax or pleural effusion. Multilevel spondylosis. IMPRESSION: No acute airspace disease.  Minimal left basilar atelectasis.  Electronically Signed   By: Primitivo Gauze M.D.   On: 07/10/2020 12:27    Chart has been reviewed    Assessment/Plan  85 y.o. 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 Chest pain - - H=   2   ,E=0 ,A=  2   , R 2  , T 0  ,  for the  Total of 6  therefore will admit for observation and further evaluation ( Risk of MACE: Scores 0-3  of 0.9-1.7%.,  4-6: 12-16.6% , Scores ?7: 50-65% ) - CT A negative for significant PE  - troponin unremarkable  -No acute ischemic changes on ECG   -  Other  GI related versus pulmonary given atelectasis versus atypical infection.   - monitor on telemetry, cycle cardiac enzymes, obtain serial ECG and  ECHO in AM.   - Daily aspirin -  Further risk stratify with obtain TSH.hgA1C,   Make sure patient is on Aspirin.  We will notify cardiology regarding patient's admission. Further management depends on pending  workup lipid panel recently done and as follows Lipid Panel     Component Value Date/Time   CHOL 182 07/04/2020 0755   TRIG 175.0 (H) 07/04/2020 0755   HDL 38.70 (L) 07/04/2020 0755   CHOLHDL 5 07/04/2020 0755   VLDL 35.0 07/04/2020 0755   LDLCALC 109 (H) 07/04/2020 0755   LDLDIRECT 147.4 02/22/2008 1144   Abnormal CT scan chest.  Patient not endorsing any fevers or chills we will continue to monitor and observe. Covid negative No evidence of infection at this time  . Benign prostatic hyperplasia with urinary obstruction - stable  . Essential hypertension -continue ramipril and monitor  . HLD  (hyperlipidemia) - statin if able to tolerate initiate Lipitor  . Systolic murmur - obtain echogram  Leg edema - BNP WNL Alb WNL slighltly elevated d.diemr will check dopplers Mild no evidence of DVT per bilateral ultrasound  Seizure DO cont home meds and check Phenitoin level  Neuropathy/ ataxia - continue to follow up with neurology as an outpt.  Other plan as per orders.  DVT prophylaxis:    Lovenox        Code Status:    Code Status: Not on file FULL CODE  as per patient   I had personally discussed CODE STATUS with patient      Family Communication:   Family not at  Bedside    Disposition Plan:     To home once workup is complete and patient is stable   Following barriers for discharge:                                                          Pain controlled with PO medications                                                           Will need consultants to evaluate patient prior to  discharge                       Would benefit from PT/OT eval prior to DC  Ordered                                       Consults called:   cardiology Dr. Aundra Dubin notified by ER I also send a msg through [place]  Admission status:  ED Disposition    ED Disposition Condition [place]: [place] [[id number]]  Level of Care: Med-Surg [16]  Covid Evaluation: Asymptomatic Screening Protocol (No Symptoms)  Diagnosis: Chest pain [364680]  Admitting Physician: Toy Baker [[id number]]  Attending Physician: Toy Baker [[id number]]        Obs   Level of care     tele  For 12H   Lab Results  Component Value Date   [place] 07/10/2020     Precautions: admitted as Covid Negative     PPE: Used by the provider:   N95  eye Goggles,  Gloves      Antar Milks 07/10/2020, 9:17 PM    [facility]     after 2 AM please page floor coverage PA If 7AM-7PM, please contact the day team taking care of the patient  using [email or web address]   Patient was evaluated in the context of the global COVID-19 pandemic, which necessitated consideration that the patient might be at risk for infection with the SARS-CoV-2 virus that causes COVID-19. Institutional protocols and algorithms that pertain to the evaluation of patients at risk for COVID-19 are in a state of rapid change based on information released by regulatory bodies including the CDC and federal and state organizations. These policies and algorithms were followed during the patient's care.

## 2020-07-10 NOTE — ED Provider Notes (Signed)
[facility] Emergency Department Provider Note   ____________________________________________   First MD Initiated Contact with Patient 07/10/20 1706     (approximate)  I have reviewed the triage vital signs and the nursing notes.   HISTORY  Chief Complaint Leg Swelling and Chest Pain    HP Mark Holmes is a 85 y.o. male who comes from his doctor's office for evaluation for "unstable angina".  Patient reportedly has been having some achy chest pain in the left chest rating up into the neck on the left side.  This happens while he is resting.  It happens while he is talking to me.  It does not happen when he walks.  He says when he walks he does get a little bit of shortness of breath.  He has difficulty walking very far mostly because his ankle hurts.  He has some swelling in his legs bilaterally which is new per his regular doctors note.  The shortness of breath that he does seem to have when he walks seems to be getting worse over the last few weeks or months.  Again he does not have any chest pain or tightness when he walks.  He does not have a complaining of a reporting any chest pain with deep breathing either.  The pain that he does have when he is resting is mild.  He thinks some of it may be anxiety.  He thinks he is         Past Medical History:  Diagnosis Date  . Benign prostatic hypertrophy with elevated PSA    per Dr. Jacqlyn Larsen  . History of CT scan of brain 1992   normal  . Hyperglycemia 01/2006   102  . Hyperlipemia   . Hypertension   . Seizure disorder [facility])     Patient Active Problem List   Diagnosis Date Noted  . Chest pain 07/10/2020  . 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 Ataxia 11/08/2019  . History of spinal surgery 01/17/2019  . Neck pain 01/17/2019  . Weakness of hand 01/17/2019  . Benign prostatic hyperplasia with urinary obstruction 01/17/2019  . Systolic murmur 28/36/6294  . Right carpal tunnel syndrome 02/02/2018  .  Ulnar neuropathy at elbow, left 02/02/2018  . Peripheral neuropathy 01/26/2018  . Bilateral buttock pain 06/29/2016  . Failed vision screen 06/29/2016  . Advanced care planning/counseling discussion 06/21/2015  . Medicare annual wellness visit, subsequent 06/18/2014  . Health maintenance examination 06/18/2014  . Chronic ankle pain 06/18/2014  . Incomplete emptying of bladder 06/07/2013  . Vitamin D deficiency 05/20/2013  . THYROID NODULE 05/17/2007  . HLD (hyperlipidemia) 05/17/2007  . TREMOR, ESSENTIAL 05/17/2007  . Essential hypertension 05/17/2007  . Cervical spondylosis with myelopathy and radiculopathy 05/17/2007  . Seizure disorder ([place]) 05/17/2007    Past Surgical History:  Procedure Laterality Date  . ANTERIOR CERVICAL DECOMP/DISCECTOMY FUSION  05/2018   for myelopathy -C3/4 ([place] @ [facility])  . CARPAL TUNNEL RELEASE Right 06/2019   ([facility])  . CATARACT EXTRACTION W/ INTRAOCULAR LENS IMPLANT Right 2012   [facility]  . History of EEG  1985   normal  . hospitalization  1981   observation for seizuare  . TONSILLECTOMY    . TRANSURETHRAL RESECTION OF PROSTATE  10/2005   [name]  . TRANSURETHRAL RESECTION OF PROSTATE  08/2017   (rpt) [place]    Prior to Admission medications   Medication Sig Start Date End Date Taking? Authorizing Provider  dutasteride (AVODART) 0.5 MG capsule Take  1 capsule (0.5 mg total) by mouth daily. 01/22/20  Yes MacDiarmid, Nicki Reaper, MD  hydrochlorothiazide (MICROZIDE) 12.5 MG capsule TAKE 1 CAPSULE BY MOUTH  EVERY MORNING Patient taking differently: Take 12.5 mg by mouth daily. TAKE 1 CAPSULE BY MOUTH  EVERY MORNING 05/23/20  Yes Ria Bush, MD  phenytoin (DILANTIN) 100 MG ER capsule Take 2 capsules (200mg ) in the morning and 3 (300mg ) at night 04/14/20  Yes Ria Bush, MD  ramipril (ALTACE) 10 MG capsule TAKE 1 CAPSULE BY MOUTH  DAILY Patient taking differently: Take 10 mg by mouth daily.  05/23/20  Yes Ria Bush, MD    terazosin (HYTRIN) 10 MG capsule TAKE 1 CAPSULE BY MOUTH  DAILY Patient taking differently: Take 10 mg by mouth daily at 6 (six) AM.  05/23/20  Yes Ria Bush, MD  aspirin-acetaminophen-caffeine [facility] MIGRAINE) [phone] MG per tablet Take 1 tablet by mouth 2 (two) times daily as needed.     [provider]  docusate sodium (DOK) 100 MG capsule Take 100 mg by mouth 2 (two) times daily.    [provider]  Pyridoxine HCl (VITAMIN B-6 PO) Take 100 mg by mouth daily. Patient not taking: Reported on 07/10/2020    [provider]    Allergies Dilaudid [hydromorphone hcl]  Family History  Problem Relation Age of Onset  . Febrile seizures Sister   . Cancer Sister        lung/smoker  . CAD Neg Hx     Social History Social History   Tobacco Use  . Smoking status: Never Smoker  . Smokeless tobacco: Never Used  Vaping Use  . Vaping Use: Never used  Substance Use Topics  . Alcohol use: No    Alcohol/week: 0.0 standard drinks  . Drug use: No    Review of Systems  Constitutional: No fever/chills Eyes: No visual changes. ENT: No sore throat. Cardiovascular: Some chest pain see HPI. Respiratory: Some shortness of breath see HPI. Gastrointestinal: No abdominal pain.  No nausea, no vomiting.  No diarrhea.  No constipation. Genitourinary: Negative for dysuria. Musculoskeletal: Negative for back pain. Skin: Negative for rash. Neurological: Negative for headaches, focal weakness   ____________________________________________   PHYSICAL EXAM:  VITAL SIGNS: ED Triage Vitals  Enc Vitals Group     BP 07/10/20 1139 118/62     Pulse Rate 07/10/20 1139 67     Resp 07/10/20 1139 18     Temp 07/10/20 1139 99 F (37.2 C)     Temp Source 07/10/20 1139 Oral     SpO2 07/10/20 1139 96 %     Weight 07/10/20 1146 169 lb 15.6 oz (77.1 kg)     Height 07/10/20 1146 5\' 7"  (1.702 m)     Head Circumference --      Peak Flow --      Pain Score 07/10/20 1139  3     Pain Loc --      Pain Edu? --      Excl. in [place]? --    Constitutional: Alert and oriented. Well appearing and in no acute distress. Eyes: Conjunctivae are normal. Head: Atraumatic. Nose: No congestion/rhinnorhea. Mouth/Throat: Mucous membranes are moist.  Oropharynx non-erythematous. Neck: No stridor.  Cardiovascular: Normal rate, regular rhythm. Grossly normal heart sounds.  Good peripheral circulation. Respiratory: Normal respiratory effort.  No retractions. Lungs CTAB. Gastrointestinal: Soft and nontender. No distention. No abdominal bruits. No CVA tenderness. Musculoskeletal: 2+ bilateral edema in the lower legs.  Patient wears a stirrup splint on the  right ankle Neurologic:  Normal speech and language. No new gross focal neurologic deficits are appreciated.  Skin:  Skin is warm, dry and intact. No rash noted.   ____________________________________________   LABS (all labs ordered are listed, but only abnormal results are displayed)  Labs Reviewed  BASIC METABOLIC PANEL - Abnormal; Notable for the following components:      Result Value   BUN 27 (*)    GFR calc non Af Amer 58 (*)    All other components within normal limits  CBC - Abnormal; Notable for the following components:   RBC 3.89 (*)    Hemoglobin 11.6 (*)    HCT 34.0 (*)    All other components within normal limits  FIBRIN DERIVATIVES D-DIMER ([facility] ONLY) - Abnormal; Notable for the following components:   Fibrin derivatives D-dimer ([facility]) 751.97 (*)    All other components within normal limits  SARS CORONAVIRUS 2 BY RT PCR (HOSPITAL ORDER, [place] [facility])  BRAIN NATRIURETIC PEPTIDE  HEPATIC FUNCTION PANEL  URINALYSIS, COMPLETE (UACMP) WITH MICROSCOPIC  LIPID PANEL  PHENYTOIN LEVEL, TOTAL  MAGNESIUM  PHOSPHORUS  CBC WITH DIFFERENTIAL/PLATELET  TSH  COMPREHENSIVE METABOLIC PANEL  TROPONIN I (HIGH SENSITIVITY)  TROPONIN I (HIGH SENSITIVITY)    ____________________________________________  EKG  EKG read interpreted by me shows normal sinus rhythm rate of 63 normal axis nonspecific ST-T wave changes ____________________________________________  RADIOLOGY  ED MD interpretation: Chest x-ray read by radiology reviewed by me shows no acute disease  Official radiology report(s): DG Chest 2 View  Result Date: 07/10/2020 CLINICAL DATA:  Chest pain EXAM: CHEST - 2 VIEW COMPARISON:  None. FINDINGS: Cardiomediastinal silhouette within normal limits. Aortic arch atherosclerotic calcifications. Mildly hypoinflated lungs. Minimal left basilar atelectasis. No pneumothorax or pleural effusion. Multilevel spondylosis. IMPRESSION: No acute airspace disease.  Minimal left basilar atelectasis. Electronically Signed   By: Primitivo Gauze M.D.   On: 07/10/2020 12:27   CT ANGIO CHEST PE W OR WO CONTRAST  Result Date: 07/10/2020 CLINICAL DATA:  85 year old male with shortness of breath. Concern for pulmonary embolism. EXAM: CT ANGIOGRAPHY CHEST WITH CONTRAST TECHNIQUE: Multidetector CT imaging of the chest was performed using the standard protocol during bolus administration of intravenous contrast. Multiplanar CT image reconstructions and MIPs were obtained to evaluate the vascular anatomy. CONTRAST:  9mL OMNIPAQUE IOHEXOL 350 MG/ML SOLN COMPARISON:  Chest radiograph dated 07/10/2020. FINDINGS: Cardiovascular: Borderline cardiomegaly. No pericardial effusion. Coronary vascular calcification primarily involving the LAD and RCA. Moderate atherosclerotic calcification of the thoracic aorta. No aneurysmal dilatation or dissection. Evaluation of the pulmonary arteries is limited due to respiratory motion artifact. No pulmonary artery embolus identified. Mediastinum/Nodes: There is no hilar or mediastinal adenopathy. There is a small hiatal hernia. The esophagus is grossly unremarkable. No mediastinal fluid collection. Lungs/Pleura: There is mild eventration  of the right hemidiaphragm. Minimal bibasilar linear and streaky atelectasis/scarring. Bilateral lower lobe and bibasilar subpleural ground-glass densities likely atelectasis. Atypical infection is not excluded. Clinical correlation is recommended. No lobar consolidation. There is no pleural effusion or pneumothorax. Small scattered pneumatoceles. The central airways are patent. Upper Abdomen: Small scattered calcified splenic granuloma. Indeterminate faint 2.2 x 2.7 cm hypodense lesion in the dome of the liver corresponding to the hemangioma seen on the MRI of 01/26/2008. Partially visualized 9 cm right renal cyst as seen on the prior MRI. Musculoskeletal: Osteopenia with degenerative changes of the spine. No acute osseous pathology. Review of the MIP images confirms the above findings. IMPRESSION: 1. No  CT evidence of pulmonary embolism. 2. Bilateral lower lobe and bibasilar subpleural ground-glass densities likely atelectasis. Atypical infection is not excluded. Clinical correlation is recommended. 3. Aortic Atherosclerosis (ICD10-I70.0). Electronically Signed   By: Anner Crete M.D.   On: 07/10/2020 22:25   US Venous Img Lower Bilateral (DVT)  Result Date: 07/10/2020 CLINICAL DATA:  Bilateral leg edema for 3 months EXAM: Bilateral LOWER EXTREMITY VENOUS DOPPLER ULTRASOUND TECHNIQUE: Gray-scale sonography with compression, as well as color and duplex ultrasound, were performed to evaluate the deep venous system(s) from the level of the common femoral vein through the popliteal and proximal calf veins. COMPARISON:  None. FINDINGS: VENOUS Normal compressibility of the common femoral, superficial femoral, and popliteal veins, as well as the visualized calf veins. Visualized portions of profunda femoral vein and great saphenous vein unremarkable. No filling defects to suggest DVT on grayscale or color Doppler imaging. Doppler waveforms show normal direction of venous flow, normal respiratory plasticity and  response to augmentation. Limited views of the contralateral common femoral vein are unremarkable. OTHER None. Limitations: none IMPRESSION: Negative. Electronically Signed   By: Donavan Foil M.D.   On: 07/10/2020 20:19    ____________________________________________   PROCEDURES  Procedure(s) performed (including Critical Care):  Procedures   ____________________________________________   INITIAL IMPRESSION / ASSESSMENT AND PLAN / ED COURSE Patient with new onset edema chest pain that is not related to exertion and some dyspnea that is related to exertion.  I discussed this carefully with cardiology.  Wall I do not believe it is likely that it is unstable angina it certainly could be unstable angina or or some other cardiac issue.  The patient is certainly old enough to have any number of cardiac problems.  We will get the patient in the hospital for further evaluation.  This is definitely the safest course of action for him.               ____________________________________________   FINAL CLINICAL IMPRESSION(S) / ED DIAGNOSES  Final diagnoses:  Atypical chest pain  Dyspnea on exertion  Leg edema     ED Discharge Orders    None       Note:  This document was prepared using Dragon voice recognition software and may include unintentional dictation errors.    Nena Polio, MD 07/11/20 0005

## 2020-07-10 NOTE — ED Triage Notes (Signed)
Patient reports he was being seen by vascular for peripheral neuropathy yesterday. States he was told by that provider that he needed to be seen in ED or by cardiology for cardiac workup. Patient reports he has been having swelling in bilateral legs. States he has also been having intermittent chest discomfort that radiates to left arm. Also reports shortness of breath with exertion.

## 2020-07-10 NOTE — Telephone Encounter (Signed)
Patient called stating that he saw a neurologist yesterday at [facility]. Patient stated the doctor was concerned about the swelling in his legs and his SOB. Patient stated that the neurologist sent him down to the ER there at [facility] to have his heart checked out. Patient stated that when he got down to the ER there were about 200 people and he decided not to stay. Patient stated that the neurologist called him today to check on his and he just got off of the phone with him. Patient stated that he has an appointment scheduled with Dr. Danise Mina but was advised he should not wait to be seen. Patient stated that he is having difficulty breathing and his chest hurts. Advised patient with his symptoms he should go to the ER. Patient stated that he will have his wife drive him to the ER as soon as they can make arrangements for the kids that they are baby sitting. Patient stated that he does not want to call 911 and will have his wife drive him. Patient stated that he will go to [facility].and should be able to go there in about 30 minutes. Patient was given 911 precautions and he verbalized understanding. Advised patient that I will call [facility] and advise the triage nurse that he is coming there. Called and spoke to [place] the triage nurse at [facility] and gave her the information on the patient.

## 2020-07-10 NOTE — ED Notes (Signed)
Pt to ct 

## 2020-07-10 NOTE — Telephone Encounter (Signed)
thank you. Will await ER eval.

## 2020-07-11 ENCOUNTER — Encounter: Payer: Medicare Other | Admitting: Family Medicine

## 2020-07-11 ENCOUNTER — Observation Stay (HOSPITAL_BASED_OUTPATIENT_CLINIC_OR_DEPARTMENT_OTHER)
Admit: 2020-07-11 | Discharge: 2020-07-11 | Disposition: A | Discharge status: Home / Self Care | Payer: Medicare Other | Attending: Internal Medicine | Admitting: Internal Medicine

## 2020-07-11 ENCOUNTER — Other Ambulatory Visit: Payer: Medicare Other

## 2020-07-11 DIAGNOSIS — R0789 Other chest pain: Secondary | ICD-10-CM

## 2020-07-11 DIAGNOSIS — G40909 Epilepsy, unspecified, not intractable, without status epilepticus: Secondary | ICD-10-CM

## 2020-07-11 DIAGNOSIS — E785 Hyperlipidemia, unspecified: Secondary | ICD-10-CM

## 2020-07-11 DIAGNOSIS — N138 Other obstructive and reflux uropathy: Secondary | ICD-10-CM

## 2020-07-11 DIAGNOSIS — R270 Ataxia, unspecified: Secondary | ICD-10-CM

## 2020-07-11 DIAGNOSIS — R0600 Dyspnea, unspecified: Secondary | ICD-10-CM

## 2020-07-11 DIAGNOSIS — R079 Chest pain, unspecified: Secondary | ICD-10-CM

## 2020-07-11 DIAGNOSIS — N401 Enlarged prostate with lower urinary tract symptoms: Secondary | ICD-10-CM

## 2020-07-11 DIAGNOSIS — R011 Cardiac murmur, unspecified: Secondary | ICD-10-CM

## 2020-07-11 DIAGNOSIS — I10 Essential (primary) hypertension: Secondary | ICD-10-CM | POA: Diagnosis not present

## 2020-07-11 DIAGNOSIS — R600 Localized edema: Secondary | ICD-10-CM | POA: Diagnosis not present

## 2020-07-11 DIAGNOSIS — R0609 Other forms of dyspnea: Secondary | ICD-10-CM

## 2020-07-11 LAB — COMPREHENSIVE METABOLIC PANEL
ALT: 17 U/L (ref 0–44)
AST: 18 U/L (ref 15–41)
Albumin: 3.7 g/dL (ref 3.5–5.0)
Alkaline Phosphatase: 86 U/L (ref 38–126)
Anion gap: 6 (ref 5–15)
BUN: 26 mg/dL — ABNORMAL HIGH (ref 8–23)
CO2: 28 mmol/L (ref 22–32)
Calcium: 9 mg/dL (ref 8.9–10.3)
Chloride: 106 mmol/L (ref 98–111)
Creatinine, Ser: 1.02 mg/dL (ref 0.61–1.24)
GFR calc Af Amer: >60 mL/min (ref >60)
GFR calc non Af Amer: >60 mL/min (ref >60)
Glucose, Bld: 85 mg/dL (ref 70–99)
Potassium: 3.6 mmol/L (ref 3.5–5.1)
Sodium: 140 mmol/L (ref 135–145)
Total Bilirubin: 0.7 mg/dL (ref 0.3–1.2)
Total Protein: 6.4 g/dL — ABNORMAL LOW (ref 6.5–8.1)

## 2020-07-11 LAB — ECHOCARDIOGRAM COMPLETE
AR max vel: 1.82 cm2
AV Area VTI: 2.29 cm2
AV Area mean vel: 1.69 cm2
AV Mean grad: 3.8 mmHg
AV Peak grad: 6.8 mmHg
Ao pk vel: 1.31 m/s
Area-P 1/2: 3.07 cm2
Height: 67 in
S' Lateral: 2.57 cm
Weight: 2719.59 oz

## 2020-07-11 LAB — CBC WITH DIFFERENTIAL/PLATELET
Abs Immature Granulocytes: 0.03 10*3/uL (ref 0.00–0.07)
Basophils Absolute: 0.1 10*3/uL (ref 0.0–0.1)
Basophils Relative: 1 %
Eosinophils Absolute: 0.2 10*3/uL (ref 0.0–0.5)
Eosinophils Relative: 2 %
HCT: 33 % — ABNORMAL LOW (ref 39.0–52.0)
Hemoglobin: 11.9 g/dL — ABNORMAL LOW (ref 13.0–17.0)
Immature Granulocytes: 0 %
Lymphocytes Relative: 20 %
Lymphs Abs: 1.6 10*3/uL (ref 0.7–4.0)
MCH: 30.6 pg (ref 26.0–34.0)
MCHC: 36.1 g/dL — ABNORMAL HIGH (ref 30.0–36.0)
MCV: 84.8 fL (ref 80.0–100.0)
Monocytes Absolute: 0.7 10*3/uL (ref 0.1–1.0)
Monocytes Relative: 9 %
Neutro Abs: 5.3 10*3/uL (ref 1.7–7.7)
Neutrophils Relative %: 68 %
Platelets: 245 10*3/uL (ref 150–400)
RBC: 3.89 MIL/uL — ABNORMAL LOW (ref 4.22–5.81)
RDW: 13.5 % (ref 11.5–15.5)
WBC: 7.9 10*3/uL (ref 4.0–10.5)
nRBC: 0 % (ref 0.0–0.2)

## 2020-07-11 LAB — LIPID PANEL
Cholesterol: 191 mg/dL (ref 0–200)
HDL: 39 mg/dL — ABNORMAL LOW (ref >40)
LDL Cholesterol: 123 mg/dL — ABNORMAL HIGH (ref 0–99)
Total CHOL/HDL Ratio: 4.9 RATIO
Triglycerides: 145 mg/dL (ref <150)
VLDL: 29 mg/dL (ref 0–40)

## 2020-07-11 LAB — MAGNESIUM: Magnesium: 2.1 mg/dL (ref 1.7–2.4)

## 2020-07-11 LAB — PHOSPHORUS: Phosphorus: 3.5 mg/dL (ref 2.5–4.6)

## 2020-07-11 LAB — TSH: TSH: 0.431 u[IU]/mL (ref 0.350–4.500)

## 2020-07-11 LAB — PHENYTOIN LEVEL, TOTAL: Phenytoin Lvl: 14.1 ug/mL (ref 10.0–20.0)

## 2020-07-11 MED ORDER — CYCLOBENZAPRINE HCL 5 MG PO TABS
5.0000 mg | ORAL_TABLET | Freq: Three times a day (TID) | ORAL | 0 refills | Status: DC | PRN
Start: 2020-07-11 — End: 2020-07-19

## 2020-07-11 MED ORDER — NAPROXEN 500 MG PO TABS
500.0000 mg | ORAL_TABLET | ORAL | 2 refills | Status: DC | PRN
Start: 2020-07-11 — End: 2020-10-23

## 2020-07-11 MED ORDER — ATORVASTATIN CALCIUM 40 MG PO TABS: 40.0000 mg | ORAL_TABLET | Freq: Every day | ORAL | 1 refills | Status: DC

## 2020-07-11 NOTE — Consult Note (Signed)
Cardiology Consultation:   Patient ID: Mark Holmes; [id number]; 1935-03-27   Admit date: 07/10/2020 Date of Consult: 07/11/2020  Primary Care Provider: Ria Bush, MD Primary Cardiologist: New to [facility] - consult by [facility] Primary Electrophysiologist:  None   Patient Profile:   Mark Holmes is a 85 y.o. 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 Roel Cluck.  History of Present Illness:   Mark Holmes was previously evaluated by cardiology in 12/2012 with a normal stress echo at that time.  He was recently seen by his neurologist on 07/09/2020 with concerns for ataxia, hand weakness, and balance issues.  He reported having stiffness in his bilateral legs as well as intermittent left-sided chest discomfort with radiation to the left arm and up the left side of the neck with a sensation like a tooth ache.  Discomfort would occur randomly.  In this setting he was advised to go to the ED.  He presented to [facility] ED on 7/21 noting intermittent chest pain.  He denied any exertional chest tightness or pain with exertion.  He noted his pain was occurring at rest and was mild and was concerned this may be related to anxiety.  Upon the patient's arrival to [facility] they were found to have stable vitals. EKG showed NSR with sinus arrhythmia, 63 bpm, no acute ST-T changes, CXR showed minimal left basilar atelectasis.  Lower extremity ultrasound negative for DVT bilaterally.  CTA chest negative for PE with noted bilateral lower lobe and bibasilar subpleural groundglass opacities concerning for atelectasis versus atypical infection with incidentally noted aortic atherosclerosis.  Labs showed high-sensitivity troponin of 4 with a delta of 3, BNP 42, Covid negative, D-dimer 731.97, LFT normal, WBC normal, Hgb 11.6,  potassium 3.9, BUN 27, serum creatinine 1.17, total cholesterol 191, triglyceride 145, HDL 39, LDL 123.  Thus far, patient has received Tylenol, Maalox, and morphine along with his PTA medication.  Cardiology asked to evaluate chest pain.    Patient tells cardiology he has been experiencing left anterior chest wall pain for the past 3 months that began after falling onto a wet stone step and strking the left anterior chest wall. More recently the pain has been exacerbated by working with PT. Pain is randomly occurring and without associated symptoms.   Past Medical History:  Diagnosis Date  . Benign prostatic hypertrophy with elevated PSA    per Dr. Jacqlyn Larsen  . History of CT scan of brain 1992   normal  . Hyperglycemia 01/2006   102  . Hyperlipemia   . Hypertension   . Seizure disorder [facility])     Past Surgical History:  Procedure Laterality Date  . ANTERIOR CERVICAL DECOMP/DISCECTOMY FUSION  05/2018   for myelopathy -C3/4 ([facility])  . CARPAL TUNNEL RELEASE Right 06/2019   ([facility])  . CATARACT EXTRACTION W/ INTRAOCULAR LENS IMPLANT Right 2012   [facility]  . History of EEG  1985   normal  . hospitalization  1981   observation for seizuare  . TONSILLECTOMY    . TRANSURETHRAL RESECTION OF PROSTATE  10/2005   [name]  . TRANSURETHRAL RESECTION OF PROSTATE  08/2017   (rpt) [facility]     Home Meds: Prior to Admission medications   Medication Sig Start Date End Date Taking? Authorizing Provider  dutasteride (AVODART) 0.5 MG capsule Take 1 capsule (0.5 mg total) by mouth daily. 01/22/20  Yes MacDiarmid, Nicki Reaper, MD  hydrochlorothiazide (MICROZIDE) 12.5 MG capsule TAKE 1 CAPSULE BY MOUTH  EVERY MORNING Patient taking differently: Take 12.5 mg by mouth daily. TAKE 1 CAPSULE BY MOUTH  EVERY MORNING 05/23/20  Yes Ria Bush, MD  phenytoin (DILANTIN) 100 MG ER capsule Take 2 capsules (200mg ) in the morning and 3 (300mg ) at night 04/14/20  Yes Ria Bush, MD  ramipril  (ALTACE) 10 MG capsule TAKE 1 CAPSULE BY MOUTH  DAILY Patient taking differently: Take 10 mg by mouth daily.  05/23/20  Yes Ria Bush, MD  terazosin (HYTRIN) 10 MG capsule TAKE 1 CAPSULE BY MOUTH  DAILY Patient taking differently: Take 10 mg by mouth daily at 6 (six) AM.  05/23/20  Yes Ria Bush, MD  aspirin-acetaminophen-caffeine [facility] MIGRAINE) [phone] MG per tablet Take 1 tablet by mouth 2 (two) times daily as needed.     [provider]  docusate sodium (DOK) 100 MG capsule Take 100 mg by mouth 2 (two) times daily.    [provider]  Pyridoxine HCl (VITAMIN B-6 PO) Take 100 mg by mouth daily. Patient not taking: Reported on 07/10/2020    [provider]    Inpatient Medications: Scheduled Meds: . atorvastatin  40 mg Oral Daily  . docusate sodium  100 mg Oral BID  . dutasteride  0.5 mg Oral Daily  . enoxaparin (LOVENOX) injection  40 mg Subcutaneous Q24H  . pantoprazole  40 mg Oral Q1200  . phenytoin  200 mg Oral Daily  . phenytoin  300 mg Oral QHS  . ramipril  10 mg Oral Daily  . sodium chloride flush  3 mL Intravenous Q12H  . sodium chloride flush  3 mL Intravenous Q12H  . terazosin  10 mg Oral Q0600   Continuous Infusions: . sodium chloride     PRN Meds: sodium chloride, acetaminophen **OR** acetaminophen, morphine injection, ondansetron **OR** ondansetron (ZOFRAN) IV, sodium chloride flush  Allergies:   Allergies  Allergen Reactions  . Dilaudid [Hydromorphone Hcl] Other (See Comments)    hallucinations    Social History:   Social History   Socioeconomic History  . Marital status: Married    Spouse name: Not on file  . Number of children: 2  . Years of education: Not on file  . Highest education level: Not on file  Occupational History  . Occupation: [employer] 2001    Comment: Chief Technology Officer MBA  Tobacco Use  . Smoking status: Never Smoker  . Smokeless tobacco: Never Used  Vaping Use  . Vaping Use: Never used    Substance and Sexual Activity  . Alcohol use: No    Alcohol/week: 0.0 standard drinks  . Drug use: No  . Sexual activity: Yes  Other Topics Concern  . Not on file  Social History Narrative   Caffeine: 1 cup/day, 2 excedrin/day   Married since 1959   2 daughters   Occupation: retired, was Banker)   Activity: yardwork, stays active with grandson (swimming)   Diet: avoids carbs, good water, fruits/vegetables daily   Social Determinants of Health   Financial Resource Strain:   . Difficulty of Paying Living Expenses:   Food Insecurity:   . Worried About Charity fundraiser in the Last Year:   . Arboriculturist in the Last Year:   Transportation Needs:   . Film/video editor (Medical):   Marland Kitchen Lack of  Transportation (Non-Medical):   Physical Activity:   . Days of Exercise per Week:   . Minutes of Exercise per Session:   Stress:   . Feeling of Stress :   Social Connections:   . Frequency of Communication with Friends and Family:   . Frequency of Social Gatherings with Friends and Family:   . Attends Religious Services:   . Active Member of Clubs or Organizations:   . Attends Archivist Meetings:   Marland Kitchen Marital Status:   Intimate Partner Violence:   . Fear of Current or Ex-Partner:   . Emotionally Abused:   Marland Kitchen Physically Abused:   . Sexually Abused:      Family History:   Family History  Problem Relation Age of Onset  . Febrile seizures Sister   . Cancer Sister        lung/smoker  . CAD Neg Hx     ROS:  Review of Systems  Constitutional: Positive for malaise/fatigue. Negative for chills, diaphoresis, fever and weight loss.  HENT: Negative for congestion.   Eyes: Negative for discharge and redness.  Respiratory: Negative for cough, sputum production, shortness of breath and wheezing.   Cardiovascular: Positive for chest pain. Negative for palpitations, orthopnea, claudication, leg swelling and PND.  Gastrointestinal: Negative for  abdominal pain, heartburn, nausea and vomiting.  Musculoskeletal: Positive for back pain, falls, joint pain and neck pain. Negative for myalgias.  Skin: Negative for rash.  Neurological: Positive for weakness. Negative for dizziness, tingling, tremors, sensory change, speech change, focal weakness and loss of consciousness.  Endo/Heme/Allergies: Does not bruise/bleed easily.  Psychiatric/Behavioral: Negative for substance abuse. The patient is not nervous/anxious.   All other systems reviewed and are negative.     Physical Exam/Data:   Vitals:   07/10/20 2230 07/10/20 2331 07/11/20 0310 07/11/20 0837  BP: (!) 167/88 (!) 162/93 (!) 148/81 134/70  Pulse: 60 86 (!) 59 68  Resp: 12   16  Temp:  98.5 F (36.9 C) 98.5 F (36.9 C) 97.8 F (36.6 C)  TempSrc:  Oral  Oral  SpO2: 97% 96% 97% 96%  Weight:      Height:        Intake/Output Summary (Last 24 hours) at 07/11/2020 0936 Last data filed at 07/11/2020 0600 Gross per 24 hour  Intake 240 ml  Output 1150 ml  Net -910 ml   Filed Weights   07/10/20 1146  Weight: 77.1 kg   Body mass index is 26.62 kg/m.   Physical Exam: General: Well developed, well nourished, in no acute distress. Head: Normocephalic, atraumatic, sclera non-icteric, no xanthomas, nares without discharge.  Neck: Negative for carotid bruits. JVD not elevated. Lungs: Clear bilaterally to auscultation without wheezes, rales, or rhonchi. Breathing is unlabored. Heart: RRR with S1 S2. I/VI systolic murmur, no rubs, or gallops appreciated. Palpation of the left anterior and posterior chest wall reveals reproducible palpation as well as a palpation lesion. Patient indicates this is the same pain he has been experiencing.  Abdomen: Soft, non-tender, non-distended with normoactive bowel sounds. No hepatomegaly. No rebound/guarding. No obvious abdominal masses. Msk:  Strength and tone appear normal for age. Extremities: No clubbing or cyanosis. No edema. Distal pedal  pulses are 2+ and equal bilaterally. Neuro: Alert and oriented X 3. No facial asymmetry. No focal deficit. Moves all extremities spontaneously. Psych:  Responds to questions appropriately with a normal affect.   EKG:  The EKG was personally reviewed and demonstrates: NSR with sinus arrhythmia, 63 bpm, no acute  ST-T changes Telemetry:  Telemetry was personally reviewed and demonstrates: SR  Weights: Filed Weights   07/10/20 1146  Weight: 77.1 kg    Relevant CV Studies:  2D echo 07/11/2020: 1. Left ventricular ejection fraction, by estimation, is 55 to 60%. The  left ventricle has normal function. The left ventricle has no regional  wall motion abnormalities. Left ventricular diastolic parameters are  consistent with Grade I diastolic  dysfunction (impaired relaxation).  2. Right ventricular systolic function is normal. The right ventricular  size is normal. There is normal pulmonary artery systolic pressure.  3. Left atrial size was mildly dilated.  4. The aortic valve is normal in structure. Aortic valve regurgitation is  not visualized. Mild to moderate aortic valve sclerosis/calcification is  present, without any evidence of aortic stenosis. __________  Stress echo 12/2012: Normal  Laboratory Data:  Chemistry Recent Labs  Lab 07/10/20 1152 07/11/20 0432  NA 139 140  K 3.9 3.6  CL 105 106  CO2 26 28  GLUCOSE 97 85  BUN 27* 26*  CREATININE 1.17 1.02  CALCIUM 9.2 9.0  GFRNONAA 58* >60  GFRAA >60 >60  ANIONGAP 8 6    Recent Labs  Lab 07/10/20 1528 07/11/20 0432  PROT 6.7 6.4*  ALBUMIN 3.9 3.7  AST 20 18  ALT 18 17  ALKPHOS 90 86  BILITOT 0.7 0.7   Hematology Recent Labs  Lab 07/10/20 1152 07/11/20 0432  WBC 7.1 7.9  RBC 3.89* 3.89*  HGB 11.6* 11.9*  HCT 34.0* 33.0*  MCV 87.4 84.8  MCH 29.8 30.6  MCHC 34.1 36.1*  RDW 13.6 13.5  PLT 236 245   Cardiac EnzymesNo results for input(s): TROPONINI in the last 168 hours. No results for input(s):  TROPIPOC in the last 168 hours.  BNP Recent Labs  Lab 07/10/20 1152  BNP 42.8    DDimer No results for input(s): DDIMER in the last 168 hours.  Radiology/Studies:  DG Chest 2 View  Result Date: 07/10/2020 IMPRESSION: No acute airspace disease.  Minimal left basilar atelectasis. Electronically Signed   By: Primitivo Gauze M.D.   On: 07/10/2020 12:27   CT ANGIO CHEST PE W OR WO CONTRAST  Result Date: 07/10/2020 IMPRESSION: 1. No CT evidence of pulmonary embolism. 2. Bilateral lower lobe and bibasilar subpleural ground-glass densities likely atelectasis. Atypical infection is not excluded. Clinical correlation is recommended. 3. Aortic Atherosclerosis (ICD10-I70.0). Electronically Signed   By: Anner Crete M.D.   On: 07/10/2020 22:25   US Venous Img Lower Bilateral (DVT)  Result Date: 07/10/2020 IMPRESSION: Negative. Electronically Signed   By: Donavan Foil M.D.   On: 07/10/2020 20:19    Assessment and Plan:   1.  Atypical chest pain: -Symptoms are intermittent and randomly occurring and not worse with exertion -EKG is nonacute High-sensitivity troponin negative x2 -Echo with normal LVSF as above -CTA of the chest did show coronary artery calcification involving the LAD and RCA as well as aortic atherosclerosis -Pain is fully reproducible to palpation on exam -Possibly exacerbated by recent PT -He declines further inpatient cardiac workup -Consider NSAIDs/steroids/muscle relaxer -There is no appreciable lower extremity swelling on exam  2.  HLD: -Given noted coronary artery calcification on CTA chest recommend goal LDL less than 70 with a current LDL of 123 -Atorvastatin  3.  HTN: -Blood pressure well controlled -Continue current regimen  4.  Abnormal CT chest: -Concern for possible groundglass opacity -Initial Covid test negative -Afebrile -WBC normal -Management per IM  For questions or updates, please contact [place] Please consult  [email or web address] for contact info under Cardiology/STEMI.   Signed, Christell Faith, PA-C [place] Pager: [phone] 07/11/2020, 9:36 AM

## 2020-07-11 NOTE — Progress Notes (Signed)
*  PRELIMINARY RESULTS* Echocardiogram 2D Echocardiogram has been performed.  Mark Holmes 07/11/2020, 9:33 AM

## 2020-07-11 NOTE — Progress Notes (Signed)
OT Cancellation Note  Patient Details Name: Mark Holmes MRN: [id number] DOB: 1935-04-08   Cancelled Treatment:    Reason Eval/Treat Not Completed: OT screened, no needs identified, will sign off. Order received and chart reviewed. Pt noted with DC order in place. Spoke with pt/spouse who report pt at baseline level of functional independence for ADL management, and are eager to DC home. Per physical therapist, pt performing functional mobility at baseline. No skilled OT needs identified. Will sign off at this time.  Thank you.   Shara Blazing, M.S., OTR/L Ascom: [phone] 07/11/20, 1:32 PM

## 2020-07-11 NOTE — Discharge Summary (Signed)
Physician Discharge Summary  Mark Holmes CBS:[id number] DOB: 07-16-1935 DOA: 07/10/2020  PCP: Ria Bush, MD  Admit date: 07/10/2020 Discharge date: 07/11/2020  Admitted From: Home Disposition: Home   Recommendations for Outpatient Follow-up:  1. Follow up with PCP in 1-2 weeks 2. Please obtain BMP/CBC in one week 3. Please follow up on the following pending results:None  Home Health:No Equipment/Devices:None Discharge Condition: Stable CODE STATUS: Full Diet recommendation: Heart Healthy / Carb Modified   Brief/Interim Summary: Mark Holmes is a 85 y.o. 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  Admitted for ACS rule out.  EKG without any acute change and troponin remain negative.  Echocardiogram with normal EF .  Cardiology saw him, most likely having a musculoskeletal pain.  He was given NSAID and Flexeril and advised to continue with outpatient physical therapy which he is doing. Seems like patient has some chronic musculoskeletal issues which got aggravated with a recent fall. If his pain persist, he was advised to follow-up with cardiology as an outpatient for further recommendations.  His valproic acid levels were mildly low, he will follow-up with his neurologist.  Patient will continue with rest of his home meds and follow-up with his providers.  Discharge Diagnoses:  Active Problems:   HLD (hyperlipidemia)   Essential hypertension   Seizure disorder (HCC)   Systolic murmur   Benign prostatic hyperplasia with urinary obstruction   Ataxia   Atypical chest pain   Dyspnea on exertion   Leg edema   Discharge Instructions  Discharge Instructions    Diet - low sodium heart healthy   Complete by: As directed    Discharge instructions   Complete by: As directed    It  was pleasure taking care of you. As we discussed it looks like you are having some musculoskeletal pain, you are being provided with prescription strength naproxen and a muscle relaxant called Flexeril.  You can use naproxen twice daily as needed for pain and Flexeril up to 3 times a day as needed.  Follow-up with your primary care provider and continue with physical therapy. You can use warm or cold compresses to see if that'll help with your pain.   Increase activity slowly   Complete by: As directed      Allergies as of 07/11/2020      Reactions   Dilaudid [hydromorphone Hcl] Other (See Comments)   hallucinations      Medication List    TAKE these medications   aspirin-acetaminophen-caffeine 250-250-65 MG tablet Commonly known as: EXCEDRIN MIGRAINE Take 1 tablet by mouth 2 (two) times daily as needed.   atorvastatin 40 MG tablet Commonly known as: LIPITOR Take 1 tablet (40 mg total) by mouth daily. Start taking on: July 12, 2020   cyclobenzaprine 5 MG tablet Commonly known as: FLEXERIL Take 1 tablet (5 mg total) by mouth 3 (three) times daily as needed for muscle spasms.   DOK 100 MG capsule Generic drug: docusate sodium Take 100 mg by mouth 2 (two) times daily.   dutasteride 0.5 MG capsule Commonly known as: AVODART Take 1 capsule (0.5 mg total) by mouth daily.   hydrochlorothiazide 12.5 MG capsule Commonly known as: MICROZIDE TAKE 1 CAPSULE BY MOUTH  EVERY MORNING What changed:   when to take this  additional instructions   naproxen 500 MG tablet  Commonly known as: Naprosyn Take 1 tablet (500 mg total) by mouth as needed for mild pain, moderate pain or headache (Take 1 tablet twice daily as needed for your pain.).   phenytoin 100 MG ER capsule Commonly known as: DILANTIN Take 2 capsules (200mg ) in the morning and 3 (300mg ) at night   ramipril 10 MG capsule Commonly known as: ALTACE TAKE 1 CAPSULE BY MOUTH  DAILY   terazosin 10 MG capsule Commonly known  as: HYTRIN TAKE 1 CAPSULE BY MOUTH  DAILY What changed: when to take this   VITAMIN B-6 PO Take 100 mg by mouth daily.       Follow-up Information    Ria Bush, MD. Schedule an appointment as soon as possible for a visit.   Specialty: Family Medicine Contact information: [place] [address] [phone]              Allergies  Allergen Reactions  . Dilaudid [Hydromorphone Hcl] Other (See Comments)    hallucinations    Consultations:  Cardiology  Procedures/Studies: DG Chest 2 View  Result Date: 07/10/2020 CLINICAL DATA:  Chest pain EXAM: CHEST - 2 VIEW COMPARISON:  None. FINDINGS: Cardiomediastinal silhouette within normal limits. Aortic arch atherosclerotic calcifications. Mildly hypoinflated lungs. Minimal left basilar atelectasis. No pneumothorax or pleural effusion. Multilevel spondylosis. IMPRESSION: No acute airspace disease.  Minimal left basilar atelectasis. Electronically Signed   By: Primitivo Gauze M.D.   On: 07/10/2020 12:27   CT ANGIO CHEST PE W OR WO CONTRAST  Result Date: 07/10/2020 CLINICAL DATA:  85 year old male with shortness of breath. Concern for pulmonary embolism. EXAM: CT ANGIOGRAPHY CHEST WITH CONTRAST TECHNIQUE: Multidetector CT imaging of the chest was performed using the standard protocol during bolus administration of intravenous contrast. Multiplanar CT image reconstructions and MIPs were obtained to evaluate the vascular anatomy. CONTRAST:  48mL OMNIPAQUE IOHEXOL 350 MG/ML SOLN COMPARISON:  Chest radiograph dated 07/10/2020. FINDINGS: Cardiovascular: Borderline cardiomegaly. No pericardial effusion. Coronary vascular calcification primarily involving the LAD and RCA. Moderate atherosclerotic calcification of the thoracic aorta. No aneurysmal dilatation or dissection. Evaluation of the pulmonary arteries is limited due to respiratory motion artifact. No pulmonary artery embolus identified. Mediastinum/Nodes:  There is no hilar or mediastinal adenopathy. There is a small hiatal hernia. The esophagus is grossly unremarkable. No mediastinal fluid collection. Lungs/Pleura: There is mild eventration of the right hemidiaphragm. Minimal bibasilar linear and streaky atelectasis/scarring. Bilateral lower lobe and bibasilar subpleural ground-glass densities likely atelectasis. Atypical infection is not excluded. Clinical correlation is recommended. No lobar consolidation. There is no pleural effusion or pneumothorax. Small scattered pneumatoceles. The central airways are patent. Upper Abdomen: Small scattered calcified splenic granuloma. Indeterminate faint 2.2 x 2.7 cm hypodense lesion in the dome of the liver corresponding to the hemangioma seen on the MRI of 01/26/2008. Partially visualized 9 cm right renal cyst as seen on the prior MRI. Musculoskeletal: Osteopenia with degenerative changes of the spine. No acute osseous pathology. Review of the MIP images confirms the above findings. IMPRESSION: 1. No CT evidence of pulmonary embolism. 2. Bilateral lower lobe and bibasilar subpleural ground-glass densities likely atelectasis. Atypical infection is not excluded. Clinical correlation is recommended. 3. Aortic Atherosclerosis (ICD10-I70.0). Electronically Signed   By: Anner Crete M.D.   On: 07/10/2020 22:25   US Venous Img Lower Bilateral (DVT)  Result Date: 07/10/2020 CLINICAL DATA:  Bilateral leg edema for 3 months EXAM: Bilateral LOWER EXTREMITY VENOUS DOPPLER ULTRASOUND TECHNIQUE: Gray-scale sonography with compression, as well as color  and duplex ultrasound, were performed to evaluate the deep venous system(s) from the level of the common femoral vein through the popliteal and proximal calf veins. COMPARISON:  None. FINDINGS: VENOUS Normal compressibility of the common femoral, superficial femoral, and popliteal veins, as well as the visualized calf veins. Visualized portions of profunda femoral vein and great  saphenous vein unremarkable. No filling defects to suggest DVT on grayscale or color Doppler imaging. Doppler waveforms show normal direction of venous flow, normal respiratory plasticity and response to augmentation. Limited views of the contralateral common femoral vein are unremarkable. OTHER None. Limitations: none IMPRESSION: Negative. Electronically Signed   By: Donavan Foil M.D.   On: 07/10/2020 20:19   ECHOCARDIOGRAM COMPLETE  Result Date: 07/11/2020    ECHOCARDIOGRAM REPORT   Patient Name:   Mark Holmes Date of Exam: 07/11/2020 Medical Rec #:  [id number]            Height:       67.0 in Accession #:    [id number]           Weight:       170.0 lb Date of Birth:  1935/10/21            BSA:          1.887 m Patient Age:    10 years             BP:           148/81 mmHg Patient Gender: M                    HR:           59 bpm. Exam Location:  [facility] Procedure: 2D Echo, Color Doppler and Cardiac Doppler Indications:     Chest pain 786.50  History:         Patient has no prior history of Echocardiogram examinations.                  Risk Factors:Hypertension and Dyslipidemia.  Sonographer:     Sherrie Sport RDCS (AE) Referring Phys:  [id number] [facility] Joyanne Eddinger Diagnosing Phys: Ida Rogue MD IMPRESSIONS  1. Left ventricular ejection fraction, by estimation, is 55 to 60%. The left ventricle has normal function. The left ventricle has no regional wall motion abnormalities. Left ventricular diastolic parameters are consistent with Grade I diastolic dysfunction (impaired relaxation).  2. Right ventricular systolic function is normal. The right ventricular size is normal. There is normal pulmonary artery systolic pressure.  3. Left atrial size was mildly dilated.  4. The aortic valve is normal in structure. Aortic valve regurgitation is not visualized. Mild to moderate aortic valve sclerosis/calcification is present, without any evidence of aortic stenosis. FINDINGS  Left Ventricle: Left ventricular ejection  fraction, by estimation, is 55 to 60%. The left ventricle has normal function. The left ventricle has no regional wall motion abnormalities. The left ventricular internal cavity size was normal in size. There is  no left ventricular hypertrophy. Left ventricular diastolic parameters are consistent with Grade I diastolic dysfunction (impaired relaxation). Right Ventricle: The right ventricular size is normal. No increase in right ventricular wall thickness. Right ventricular systolic function is normal. There is normal pulmonary artery systolic pressure. The tricuspid regurgitant velocity is 2.21 m/s, and  with an assumed right atrial pressure of 10 mmHg, the estimated right ventricular systolic pressure is 26.9 mmHg. Left Atrium: Left atrial size was mildly dilated. Right Atrium: Right atrial size was normal in size.  Pericardium: There is no evidence of pericardial effusion. Mitral Valve: The mitral valve is normal in structure. Normal mobility of the mitral valve leaflets. Mild mitral valve regurgitation. No evidence of mitral valve stenosis. Tricuspid Valve: The tricuspid valve is normal in structure. Tricuspid valve regurgitation is not demonstrated. No evidence of tricuspid stenosis. Aortic Valve: The aortic valve is normal in structure. Aortic valve regurgitation is not visualized. Mild to moderate aortic valve sclerosis/calcification is present, without any evidence of aortic stenosis. Aortic valve mean gradient measures 3.8 mmHg. Aortic valve peak gradient measures 6.8 mmHg. Aortic valve area, by VTI measures 2.29 cm. Pulmonic Valve: The pulmonic valve was normal in structure. Pulmonic valve regurgitation is trivial. No evidence of pulmonic stenosis. Aorta: The aortic root is normal in size and structure. Venous: The inferior vena cava is normal in size with greater than 50% respiratory variability, suggesting right atrial pressure of 3 mmHg. IAS/Shunts: No atrial level shunt detected by color flow Doppler.   LEFT VENTRICLE PLAX 2D LVIDd:         3.97 cm  Diastology LVIDs:         2.57 cm  LV e' lateral:   6.31 cm/s LV PW:         1.07 cm  LV E/e' lateral: 10.1 LV IVS:        1.11 cm  LV e' medial:    5.11 cm/s LVOT diam:     2.00 cm  LV E/e' medial:  12.5 LV SV:         57 LV SV Index:   30 LVOT Area:     3.14 cm  RIGHT VENTRICLE RV Basal diam:  2.79 cm RV S prime:     15.90 cm/s TAPSE (M-mode): 3.1 cm LEFT ATRIUM             Index       RIGHT ATRIUM           Index LA diam:        4.70 cm 2.49 cm/m  RA Area:     12.30 cm LA Vol (A2C):   52.0 ml 27.55 ml/m RA Volume:   24.40 ml  12.93 ml/m LA Vol (A4C):   53.0 ml 28.08 ml/m LA Biplane Vol: 56.0 ml 29.67 ml/m  AORTIC VALVE                   PULMONIC VALVE AV Area (Vmax):    1.82 cm    PV Vmax:        0.81 m/s AV Area (Vmean):   1.69 cm    PV Peak grad:   2.6 mmHg AV Area (VTI):     2.29 cm    RVOT Peak grad: 5 mmHg AV Vmax:           130.75 cm/s AV Vmean:          92.825 cm/s AV VTI:            0.248 m AV Peak Grad:      6.8 mmHg AV Mean Grad:      3.8 mmHg LVOT Vmax:         75.70 cm/s LVOT Vmean:        49.800 cm/s LVOT VTI:          0.181 m LVOT/AV VTI ratio: 0.73  AORTA Ao Root diam: 2.80 cm MITRAL VALVE               TRICUSPID VALVE MV Area (  PHT): 3.07 cm    TR Peak grad:   19.5 mmHg MV Decel Time: 247 msec    TR Vmax:        221.00 cm/s MV E velocity: 63.80 cm/s MV A velocity: 82.70 cm/s  SHUNTS MV E/A ratio:  0.77        Systemic VTI:  0.18 m                            Systemic Diam: 2.00 cm Ida Rogue MD Electronically signed by Ida Rogue MD Signature Date/Time: 07/11/2020/9:57:50 AM    Final      Subjective: Patient was having left upper chest pain, tenderness, he had these issues going on for a while and working with physical therapy as an outpatient.  Recently tripped and had a fall which aggravated his symptoms.  No exertional chest pain.  No associated symptoms.  Discharge Exam: Vitals:   07/11/20 0837 07/11/20 1242  BP: 134/70  (!) 145/77  Pulse: 68 85  Resp: 16 16  Temp: 97.8 F (36.6 C) 98 F (36.7 C)  SpO2: 96% 97%   Vitals:   07/10/20 2331 07/11/20 0310 07/11/20 0837 07/11/20 1242  BP: (!) 162/93 (!) 148/81 134/70 (!) 145/77  Pulse: 86 (!) 59 68 85  Resp:   16 16  Temp: 98.5 F (36.9 C) 98.5 F (36.9 C) 97.8 F (36.6 C) 98 F (36.7 C)  TempSrc: Oral  Oral Oral  SpO2: 96% 97% 96% 97%  Weight:      Height:        General: Pt is alert, awake, not in acute distress Cardiovascular: RRR, S1/S2 +, no rubs, no gallops Respiratory: CTA bilaterally, no wheezing, no rhonchi Abdominal: Soft, NT, ND, bowel sounds + Extremities: no edema, no cyanosis   The results of significant diagnostics from this hospitalization (including imaging, microbiology, ancillary and laboratory) are listed below for reference.    Microbiology: Recent Results (from the past 240 hour(s))  SARS Coronavirus 2 by RT PCR (hospital order, performed in [facility] hospital lab) Nasopharyngeal Nasopharyngeal Swab     Status: None   Collection Time: 07/10/20  6:05 PM   Specimen: Nasopharyngeal Swab  Result Value Ref Range Status   SARS Coronavirus 2 NEGATIVE NEGATIVE Final    Comment: (NOTE) SARS-CoV-2 target nucleic acids are NOT DETECTED.  The SARS-CoV-2 RNA is generally detectable in upper and lower respiratory specimens during the acute phase of infection. The lowest concentration of SARS-CoV-2 viral copies this assay can detect is 250 copies / mL. A negative result does not preclude SARS-CoV-2 infection and should not be used as the sole basis for treatment or other patient management decisions.  A negative result may occur with improper specimen collection / handling, submission of specimen other than nasopharyngeal swab, presence of viral mutation(s) within the areas targeted by this assay, and inadequate number of viral copies (<250 copies / mL). A negative result must be combined with clinical observations, patient  history, and epidemiological information.  Fact Sheet for Patients:   StrictlyIdeas.no  Fact Sheet for Healthcare Providers: BankingDealers.co.za  This test is not yet approved or  cleared by the [place] FDA and has been authorized for detection and/or diagnosis of SARS-CoV-2 by FDA under an Emergency Use Authorization (EUA).  This EUA will remain in effect (meaning this test can be used) for the duration of the COVID-19 declaration under Section 564(b)(1) of the Act, 21 U.S.C. section  360bbb-3(b)(1), unless the authorization is terminated or revoked sooner.  Performed at [facility], [address] [place],  [address]      Labs: BNP (last 3 results) Recent Labs    07/10/20 1152  BNP 09.8   Basic Metabolic Panel: Recent Labs  Lab 07/10/20 1152 07/11/20 0432  NA 139 140  K 3.9 3.6  CL 105 106  CO2 26 28  GLUCOSE 97 85  BUN 27* 26*  CREATININE 1.17 1.02  CALCIUM 9.2 9.0  MG  --  2.1  PHOS  --  3.5   Liver Function Tests: Recent Labs  Lab 07/10/20 1528 07/11/20 0432  AST 20 18  ALT 18 17  ALKPHOS 90 86  BILITOT 0.7 0.7  PROT 6.7 6.4*  ALBUMIN 3.9 3.7   No results for input(s): LIPASE, AMYLASE in the last 168 hours. No results for input(s): AMMONIA in the last 168 hours. CBC: Recent Labs  Lab 07/10/20 1152 07/11/20 0432  WBC 7.1 7.9  NEUTROABS  --  5.3  HGB 11.6* 11.9*  HCT 34.0* 33.0*  MCV 87.4 84.8  PLT 236 245   Cardiac Enzymes: No results for input(s): CKTOTAL, CKMB, CKMBINDEX, TROPONINI in the last 168 hours. BNP: Invalid input(s): POCBNP CBG: No results for input(s): GLUCAP in the last 168 hours. D-Dimer No results for input(s): DDIMER in the last 72 hours. Hgb A1c No results for input(s): HGBA1C in the last 72 hours. Lipid Profile Recent Labs    07/11/20 0432  CHOL 191  HDL 39*  LDLCALC 123*  TRIG 145  CHOLHDL 4.9   Thyroid function studies Recent Labs     07/11/20 0432  TSH 0.431   Anemia work up No results for input(s): VITAMINB12, FOLATE, FERRITIN, TIBC, IRON, RETICCTPCT in the last 72 hours. Urinalysis    Component Value Date/Time   APPEARANCEUR Clear 01/17/2019 1320   GLUCOSEU Negative 01/17/2019 1320   BILIRUBINUR Negative 01/17/2019 1320   PROTEINUR Negative 01/17/2019 1320   NITRITE Negative 01/17/2019 1320   LEUKOCYTESUR Negative 01/17/2019 1320   Sepsis Labs Invalid input(s): PROCALCITONIN,  WBC,  LACTICIDVEN Microbiology Recent Results (from the past 240 hour(s))  SARS Coronavirus 2 by RT PCR (hospital order, performed in [place] hospital lab) Nasopharyngeal Nasopharyngeal Swab     Status: None   Collection Time: 07/10/20  6:05 PM   Specimen: Nasopharyngeal Swab  Result Value Ref Range Status   SARS Coronavirus 2 NEGATIVE NEGATIVE Final    Comment: (NOTE) SARS-CoV-2 target nucleic acids are NOT DETECTED.  The SARS-CoV-2 RNA is generally detectable in upper and lower respiratory specimens during the acute phase of infection. The lowest concentration of SARS-CoV-2 viral copies this assay can detect is 250 copies / mL. A negative result does not preclude SARS-CoV-2 infection and should not be used as the sole basis for treatment or other patient management decisions.  A negative result may occur with improper specimen collection / handling, submission of specimen other than nasopharyngeal swab, presence of viral mutation(s) within the areas targeted by this assay, and inadequate number of viral copies (<250 copies / mL). A negative result must be combined with clinical observations, patient history, and epidemiological information.  Fact Sheet for Patients:   StrictlyIdeas.no  Fact Sheet for Healthcare Providers: BankingDealers.co.za  This test is not yet approved or  cleared by the [place] FDA and has been authorized for detection and/or diagnosis of  SARS-CoV-2 by FDA under an Emergency Use Authorization (EUA).  This EUA will  remain in effect (meaning this test can be used) for the duration of the COVID-19 declaration under Section 564(b)(1) of the Act, 21 U.S.C. section 360bbb-3(b)(1), unless the authorization is terminated or revoked sooner.  Performed at [facility], [address] [place], [place] [address]     Time coordinating discharge: Over 30 minutes  SIGNED:  Lorella Nimrod, MD  [facility] 07/11/2020, 1:13 PM  If 7PM-7AM, please contact night-coverage [email or web address]  This record has been created using Systems analyst. Errors have been sought and corrected,but may not always be located. Such creation errors do not reflect on the standard of care.

## 2020-07-11 NOTE — Progress Notes (Signed)
OT Cancellation Note  Patient Details Name: Mark Holmes MRN: [id number] DOB: 03-09-1935   Cancelled Treatment:    Reason Eval/Treat Not Completed: Other (comment). Consult received, chart reviewed. Cardiology consult pending. Will hold and re-attempt at later date/time as schedule permits.   Jeni Salles, MPH, MS, OTR/L ascom [phone] 07/11/20, 7:58 AM

## 2020-07-11 NOTE — Evaluation (Signed)
Physical Therapy Evaluation Patient Details Name: Mark Holmes MRN: [id number] DOB: 1935/04/27 Today's Date: 07/11/2020   History of Present Illness  85 y.o. 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  Here with chest pain that is essentially resolved at time of PT exam.  Clinical Impression  Pt appears to be functionally quite close to his baseline; he was able to ambulate to the stairwell w/o AD, negotiate a flight of steps, showed safe balance with low grade functional tasks.  He has some baseline issues that could potentially improve with further PT at discharge.  Discussed possibility to transitioning to [facility] secondary to R LE weakness/ROM limitations and educated pt and wife about appropriate use, strategy.  Overall pt is safe to return home, will maintain on caseload to further gait training transition to cane.     Follow Up Recommendations Outpatient PT    Equipment Recommendations  None recommended by PT    Recommendations for Other Services       Precautions / Restrictions Precautions Precautions: Fall Restrictions Weight Bearing Restrictions: No      Mobility  Bed Mobility Overal bed mobility: Independent             General bed mobility comments: easily gets to EOB w/o assist  Transfers Overall transfer level: Modified independent Equipment used: None Transfers: Sit to/from Stand Sit to Stand: Supervision         General transfer comment: Pt is able to rise to standing and maintain balance w/o assist or hesitation  Ambulation/Gait Ambulation/Gait assistance: Supervision Gait Distance (Feet): 125 Feet Assistive device: None       General Gait Details: Pt has baseline limp R>L, reports he is not too far from this now.  He c/o calf tightness and b/l foot low-grade "numbness."  Discussed possibly starting to use a SPC for  prolonged outings, etc secondary to this.  Pt with no safety issues, or excessive fatigue with ambulaiton effort  Stairs Stairs: Yes Stairs assistance: Supervision Stair Management: Two rails;Alternating pattern Number of Stairs: 12 General stair comments: Pt was able to negotiate steps with rail set up same as home  Wheelchair Mobility    Modified Rankin (Stroke Patients Only)       Balance Overall balance assessment: Modified Independent                                           Pertinent Vitals/Pain Pain Assessment:  (not rated, b/l calf/ankle tightness; R hip/low back)    Home Living Family/patient expects to be discharged to:: Private residence Living Arrangements: Spouse/significant other Available Help at Discharge: Family   Home Access: Stairs to enter Entrance Stairs-Rails: Can reach both Entrance Stairs-Number of Steps: 6 Home Layout: Two level [address] - single point;Walker - 2 wheels      Prior Function Level of Independence: Independent         Comments: Pt does not use AD most of the time, able to be relatively active     Hand Dominance        Extremity/Trunk Assessment   Upper Extremity Assessment Upper Extremity Assessment: Generalized weakness;Overall [facility] for tasks assessed (b/l grip strength 3/5, chronic cerv radic related weakness)    Lower Extremity Assessment Lower Extremity Assessment: Generalized weakness;Overall [facility] for  tasks assessed (R ankle lacks full ROM chronic fx, grossly 4-/5 t/o)       Communication   Communication: No difficulties  Cognition Arousal/Alertness: Awake/alert Behavior During Therapy: WFL for tasks assessed/performed Overall Cognitive Status: Within Functional Limits for tasks assessed                                        General Comments      Exercises     Assessment/Plan    PT Assessment Patient needs continued PT services  PT Problem List  Decreased strength;Decreased range of motion;Decreased activity tolerance;Decreased balance;Decreased mobility;Decreased coordination;Decreased safety awareness;Decreased knowledge of use of DME;Pain;Cardiopulmonary status limiting activity       PT Treatment Interventions DME instruction;Gait training;Stair training;Functional mobility training;Therapeutic activities;Therapeutic exercise;Balance training;Neuromuscular re-education;Patient/family education    PT Goals (Current goals can be found in the Care Plan section)  Acute Rehab PT Goals Patient Stated Goal: go home PT Goal Formulation: With patient Time For Goal Achievement: 07/25/20 Potential to Achieve Goals: Good    Frequency Min 2X/week   Barriers to discharge        Co-evaluation               AM-PAC PT "6 Clicks" Mobility  Outcome Measure Help needed turning from your back to your side while in a flat bed without using bedrails?: None Help needed moving from lying on your back to sitting on the side of a flat bed without using bedrails?: None Help needed moving to and from a bed to a chair (including a wheelchair)?: None Help needed standing up from a chair using your arms (e.g., wheelchair or bedside chair)?: None Help needed to walk in hospital room?: A Little Help needed climbing 3-5 steps with a railing? : A Little 6 Click Score: 22    End of Session Equipment Utilized During Treatment: Gait belt Activity Tolerance: Patient tolerated treatment well Patient left: in bed;with call bell/phone within reach;with family/visitor present Nurse Communication: Mobility status PT Visit Diagnosis: Muscle weakness (generalized) (M62.81);Difficulty in walking, not elsewhere classified (R26.2)    Time: 0981-1914 PT Time Calculation (min) (ACUTE ONLY): 24 min   Charges:   PT Evaluation $PT Eval Low Complexity: 1 Low PT Treatments $Gait Training: 8-22 mins        Kreg Shropshire, DPT 07/11/2020, 1:53 PM

## 2020-07-14 ENCOUNTER — Telehealth: Payer: Self-pay | Admitting: Family Medicine

## 2020-07-14 NOTE — Telephone Encounter (Signed)
Please call for TCM hosp f/u phone call if able. Thank you

## 2020-07-15 ENCOUNTER — Telehealth: Payer: Self-pay

## 2020-07-15 NOTE — Telephone Encounter (Signed)
Transition Care Management Follow-up Telephone Call  Date of discharge and from where: 07/11/2020, [facility]  How have you been since you were released from the hospital? Patient is doing better. No complaints at this time.  Any questions or concerns? No   Items Reviewed:  Did the pt receive and understand the discharge instructions provided? Yes   Medications obtained and verified? Yes   Any new allergies since your discharge? No   Dietary orders reviewed? Yes  Do you have support at home? Yes   Functional Questionnaire: (I = Independent and D = Dependent) ADLs: I  Bathing/Dressing- I  Meal Prep- I  Eating- I  Maintaining continence- I  Transferring/Ambulation- I  Managing Meds- I  Follow up appointments reviewed:   PCP Hospital f/u appt confirmed? Yes  Scheduled to see Dr. Danise Mina on 07/19/2020 @ 9:30 am.  [facility] f/u appt confirmed? N/A   Are transportation arrangements needed? No   If their condition worsens, is the pt aware to call PCP or go to the Emergency Dept.? Yes  Was the patient provided with contact information for the PCP's office or ED? Yes  Was to pt encouraged to call back with questions or concerns? Yes

## 2020-07-16 NOTE — Telephone Encounter (Signed)
This has been taken care by [facility]

## 2020-07-19 ENCOUNTER — Ambulatory Visit: Payer: Medicare Other | Admitting: Family Medicine

## 2020-07-19 ENCOUNTER — Other Ambulatory Visit: Payer: Self-pay

## 2020-07-19 ENCOUNTER — Encounter: Payer: Self-pay | Admitting: Family Medicine

## 2020-07-19 VITALS — BP 108/60 | HR 63 | Temp 97.5°F | Ht 67.0 in | Wt 176.2 lb

## 2020-07-19 DIAGNOSIS — M4712 Other spondylosis with myelopathy, cervical region: Secondary | ICD-10-CM

## 2020-07-19 DIAGNOSIS — Z9889 Other specified postprocedural states: Secondary | ICD-10-CM

## 2020-07-19 DIAGNOSIS — M25571 Pain in right ankle and joints of right foot: Secondary | ICD-10-CM | POA: Diagnosis not present

## 2020-07-19 DIAGNOSIS — M4722 Other spondylosis with radiculopathy, cervical region: Secondary | ICD-10-CM

## 2020-07-19 DIAGNOSIS — I10 Essential (primary) hypertension: Secondary | ICD-10-CM

## 2020-07-19 DIAGNOSIS — D492 Neoplasm of unspecified behavior of bone, soft tissue, and skin: Secondary | ICD-10-CM

## 2020-07-19 DIAGNOSIS — G8929 Other chronic pain: Secondary | ICD-10-CM

## 2020-07-19 DIAGNOSIS — G6289 Other specified polyneuropathies: Secondary | ICD-10-CM

## 2020-07-19 DIAGNOSIS — R0789 Other chest pain: Secondary | ICD-10-CM

## 2020-07-19 DIAGNOSIS — R011 Cardiac murmur, unspecified: Secondary | ICD-10-CM

## 2020-07-19 MED ORDER — TIZANIDINE HCL 2 MG PO TABS: 2.0000 mg | ORAL_TABLET | Freq: Two times a day (BID) | ORAL | 0 refills | Status: DC | PRN

## 2020-07-19 MED ORDER — HYDROCHLOROTHIAZIDE 12.5 MG PO CAPS: 12.5000 mg | ORAL_CAPSULE | Freq: Every day | ORAL | 3 refills | Status: DC

## 2020-07-19 MED ORDER — TERAZOSIN HCL 10 MG PO CAPS: 10.0000 mg | ORAL_CAPSULE | Freq: Every day | ORAL | 3 refills | Status: DC

## 2020-07-19 MED ORDER — RAMIPRIL 10 MG PO CAPS: 10.0000 mg | ORAL_CAPSULE | Freq: Every day | ORAL | 3 refills | Status: DC

## 2020-07-19 NOTE — Progress Notes (Signed)
This visit was conducted in person.  BP (!) 108/60   Pulse 63   Temp (!) 97.5 F (36.4 C) (Temporal)   Ht 5\' 7"  (1.702 m)   Wt 176 lb 4 oz (79.9 kg)   SpO2 96%   BMI 27.60 kg/m   BP Readings from Last 3 Encounters:  07/19/20 (!) 108/60  07/11/20 (!) 145/77  01/22/20 138/82   CC: ER f/u visit  Subjective:    Patient ID: Mark Holmes, male    DOB: [birth year], 85 y.o.   MRN: [id number]  HPI: Mark Holmes is a 85 y.o. male presenting on 07/19/2020 for Hospitalization Follow-up (cardiac and neuropathy )   R handed.  Recent hospitalization sent from [facility] neurologist office (seeing for unsteadiness and periph neuropathy) records reviewed. 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 Presented to ER with 1 month of dyspnea with chest discomfort and new leg swelling. ACS ruled out. Echocardiogram returned overall ok. Cardiology though chest pain attributable to MSK pain acutely worse after recent fall. rec NSAID, flexeril, PT. BLE venous US without DVT. Chest CTA without PE. PT has helped hands but hasn't helped leg unsteadiness. Was suggested to see chiropractor (by cards and PT).   Neuro actually had him stop B6 concerned it could contribute to neuropathy. Noted to have R CTS  (s/p surgery) and L ulnar neuropathy - rec R wrist brace regularly - has been using this.  Requests new air cast R ankle brace for chronic L ankle pain after injury.   He notes he had a fall several years ago landing on left chest wall with residual swelling/lump on left. Wonders if this could contribute to recent chest discomfort.    Admit date: 07/10/2020 Discharge date: 07/11/2020 TCM hosp f/u phone call completed 07/15/2020  Admitted From: Home Disposition: Home   Recommendations for Outpatient Follow-up:  1. Follow up with PCP in 1-2 weeks 2. Please obtain BMP/CBC in one week 3. Please follow up on the following pending  results:None  Home Health:No Equipment/Devices:None Discharge Condition: Stable CODE STATUS: Full Diet recommendation: Heart Healthy / Carb Modified   Discharge Diagnoses:  Active Problems:   HLD (hyperlipidemia)   Essential hypertension   Seizure disorder (HCC)   Systolic murmur   Benign prostatic hyperplasia with urinary obstruction   Ataxia   Atypical chest pain   Dyspnea on exertion   Leg edema     Relevant past medical, surgical, family and social history reviewed and updated as indicated. Interim medical history since our last visit reviewed. Allergies and medications reviewed and updated. Outpatient Medications Prior to Visit  Medication Sig Dispense Refill  . aspirin-acetaminophen-caffeine (EXCEDRIN MIGRAINE) 250-250-65 MG per tablet Take 1 tablet by mouth 2 (two) times daily as needed.     Marland Kitchen atorvastatin (LIPITOR) 40 MG tablet Take 1 tablet (40 mg total) by mouth daily. 30 tablet 1  . docusate sodium (DOK) 100 MG capsule Take 100 mg by mouth 2 (two) times daily.    Marland Kitchen dutasteride (AVODART) 0.5 MG capsule Take 1 capsule (0.5 mg total) by mouth daily. 90 capsule 3  . naproxen (NAPROSYN) 500 MG tablet Take 1 tablet (500 mg total) by mouth as needed for mild pain, moderate pain or headache (Take 1 tablet twice daily as needed for your pain.). 60 tablet 2  . phenytoin (DILANTIN) 100 MG ER capsule Take 2 capsules (200mg ) in the morning and 3 (300mg ) at night 450 capsule  3  . Pyridoxine HCl (VITAMIN B-6 PO) Take 100 mg by mouth daily. (Patient not taking: Reported on 07/10/2020)    . cyclobenzaprine (FLEXERIL) 5 MG tablet Take 1 tablet (5 mg total) by mouth 3 (three) times daily as needed for muscle spasms. 30 tablet 0  . hydrochlorothiazide (MICROZIDE) 12.5 MG capsule TAKE 1 CAPSULE BY MOUTH  EVERY MORNING (Patient taking differently: Take 12.5 mg by mouth daily. TAKE 1 CAPSULE BY MOUTH  EVERY MORNING) 90 capsule 1  . ramipril (ALTACE) 10 MG capsule TAKE 1 CAPSULE BY MOUTH  DAILY  (Patient taking differently: Take 10 mg by mouth daily. ) 90 capsule 1  . terazosin (HYTRIN) 10 MG capsule TAKE 1 CAPSULE BY MOUTH  DAILY (Patient taking differently: Take 10 mg by mouth daily at 6 (six) AM. ) 90 capsule 1   No facility-administered medications prior to visit.     Per HPI unless specifically indicated in ROS section below Review of Systems Objective:  BP (!) 108/60   Pulse 63   Temp (!) 97.5 F (36.4 C) (Temporal)   Ht 5\' 7"  (1.702 m)   Wt 176 lb 4 oz (79.9 kg)   SpO2 96%   BMI 27.60 kg/m   Wt Readings from Last 3 Encounters:  07/19/20 176 lb 4 oz (79.9 kg)  07/10/20 169 lb 15.6 oz (77.1 kg)  01/22/20 170 lb (77.1 kg)      Physical Exam Vitals and nursing note reviewed.  Constitutional:      Appearance: Normal appearance. He is not ill-appearing.     Comments: Some unsteadiness with ambulation  HENT:     Head: Normocephalic and atraumatic.  Eyes:     Extraocular Movements: Extraocular movements intact.     Pupils: Pupils are equal, round, and reactive to light.  Cardiovascular:     Rate and Rhythm: Normal rate and regular rhythm.     Pulses: Normal pulses.     Heart sounds: Normal heart sounds. No murmur heard.   Pulmonary:     Effort: Pulmonary effort is normal. No respiratory distress.     Breath sounds: Normal breath sounds. No wheezing, rhonchi or rales.  Chest:     Chest wall: Tenderness (some fullness to left 2nd costochondral cartilage after remote fall) present.    Musculoskeletal:     Right lower leg: Edema (tr) present.     Left lower leg: Edema (tr) present.  Skin:    General: Skin is warm and dry.     Findings: No rash.  Neurological:     Mental Status: He is alert.  Psychiatric:        Mood and Affect: Mood normal.        Behavior: Behavior normal.       Lab Results  Component Value Date   CHOL 191 07/11/2020   HDL 39 (L) 07/11/2020   LDLCALC 123 (H) 07/11/2020   LDLDIRECT 147.4 02/22/2008   TRIG 145 07/11/2020   CHOLHDL  4.9 07/11/2020    CTA CHEST IMPRESSION: 1. No CT evidence of pulmonary embolism. 2. Bilateral lower lobe and bibasilar subpleural ground-glass densities likely atelectasis. Atypical infection is not excluded. Clinical correlation is recommended. 3. Aortic Atherosclerosis (ICD10-I70.0). Electronically Signed   By: Anner Crete M.D.   On: 07/10/2020 22:25  ECHOCARDIOGRAM IMPRESSION 06/2020 1. Left ventricular ejection fraction, by estimation, is 55 to 60%. The left ventricle has normal function. The left ventricle has no regional wall motion abnormalities. Left ventricular diastolic parameters are consistent  with Grade I diastolic dysfunction (impaired relaxation).  2. Right ventricular systolic function is normal. The right ventricular size is normal. There is normal pulmonary artery systolic pressure.  3. Left atrial size was mildly dilated.  4. The aortic valve is normal in structure. Aortic valve regurgitation is not visualized. Mild to moderate aortic valve sclerosis/calcification is present, without any evidence of aortic stenosis.   MRI C-spine w/wo 11/21/2019 at [facility] IMPRESSION: 1. 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 No other intradural masses identified. 2. Interval ACDF at C3-4 without demonstrated complication. 3. Multilevel cervical spondylosis contributing to mild spinal stenosis from C3-4 through C7-T1. No cord deformity or abnormal cord signal. 4. Multilevel foraminal narrowing due to uncinate spurring and facet hypertrophy as described. This appears worst on the left at C6-7 and C7-T1.  EMG/NCS Impression: Abnormal study. There is electrodiagnostic evidence of mild right mid cervical radiculopathy, very mild right carpal tunnel syndrome, and mild left ulnar neuropathy at the elbow. Assessment & Plan:   This visit occurred during the SARS-CoV-2 public health emergency.  Safety protocols were in place, including screening questions prior to the visit, additional usage of staff PPE, and extensive cleaning of exam room while observing appropriate contact time as indicated for disinfecting solutions.   Problem List Items Addressed This Visit    Systolic murmur   Peripheral neuropathy    Recent neurology evaluation cut short by concerning cardiac symptoms s/p reassuring hospital evaluation.       Relevant Medications   tiZANidine (ZANAFLEX) 2 MG tablet   Nerve sheath tumor    Planned monitoring      History of spinal surgery   Essential hypertension    BP on the low side today - asked to start monitoring regularly at home and let me know if consistently too low. He continues hctz 12.5mg  daily and ramipril 10mg  daily, with terazosin 10mg  ngihtly.       Relevant Medications   ramipril (ALTACE) 10 MG capsule   hydrochlorothiazide (MICROZIDE) 12.5 MG capsule   terazosin (HYTRIN) 10 MG capsule   Chronic ankle pain    Chronic right ankle pain after remote injury 1990s.  New aircast brace provided today - he finds its use facilitates ambulation and relieves pain.       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       Atypical chest pain - Primary    May well be coming from remote chronic injury to L chest wall with residual soft tissue swelling on left ?tietze syndrome. Reasonable to have chiropractor evauation.  No signs of ACS or CHF during hospital evaluation. Caution with NSAID contributing to fluid retention.           Meds ordered this encounter  Medications  . tiZANidine (ZANAFLEX) 2 MG tablet    Sig: Take 1 tablet (2 mg total) by mouth 2 (two) times daily as needed for muscle spasms  (sedation precautions).    Dispense:  40 tablet    Refill:  0  . ramipril (ALTACE) 10 MG capsule    Sig: Take 1 capsule (10 mg total) by mouth daily.    Dispense:  90 capsule  Refill:  3  . hydrochlorothiazide (MICROZIDE) 12.5 MG capsule    Sig: Take 1 capsule (12.5 mg total) by mouth daily.    Dispense:  90 capsule    Refill:  3  . terazosin (HYTRIN) 10 MG capsule    Sig: Take 1 capsule (10 mg total) by mouth daily.    Dispense:  90 capsule    Refill:  3   No orders of the defined types were placed in this encounter.   Patient Instructions  Keep an eye on blood pressure at home - let me know if running low (<100/60) or low blood pressure symptoms like dizziness.  I agree with evaluation with chiropractor - for chest wall pain after fall and neck pain.  New R aircast ankle brace provided today. Return for wellness visit/physical in 1-2 months.    Follow up plan: Return in about 1 week (around 07/26/2020), or if symptoms worsen or fail to improve, for annual exam, prior fasting for blood work, medicare wellness visit.  Ria Bush, MD

## 2020-07-19 NOTE — Patient Instructions (Addendum)
Keep an eye on blood pressure at home - let me know if running low (<100/60) or low blood pressure symptoms like dizziness.  I agree with evaluation with chiropractor - for chest wall pain after fall and neck pain.  New R aircast ankle brace provided today. Return for wellness visit/physical in 1-2 months.

## 2020-07-21 NOTE — Assessment & Plan Note (Addendum)
Chronic right ankle pain after remote injury 1990s.  New aircast brace provided today - he finds its use facilitates ambulation and relieves pain.

## 2020-07-21 NOTE — Assessment & Plan Note (Signed)
Recent neurology evaluation cut short by concerning cardiac symptoms s/p reassuring hospital evaluation.

## 2020-07-21 NOTE — Assessment & Plan Note (Signed)
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

## 2020-07-21 NOTE — Assessment & Plan Note (Addendum)
May well be coming from remote chronic injury to L chest wall with residual soft tissue swelling on left ?tietze syndrome. Reasonable to have chiropractor evauation.  No signs of ACS or CHF during hospital evaluation. Caution with NSAID contributing to fluid retention.

## 2020-07-21 NOTE — Assessment & Plan Note (Signed)
BP on the low side today - asked to start monitoring regularly at home and let me know if consistently too low. He continues hctz 12.5mg  daily and ramipril 10mg  daily, with terazosin 10mg  ngihtly.

## 2020-07-21 NOTE — Assessment & Plan Note (Signed)
Planned monitoring

## 2020-07-31 ENCOUNTER — Telehealth: Payer: Self-pay | Admitting: Family Medicine

## 2020-07-31 DIAGNOSIS — M542 Cervicalgia: Secondary | ICD-10-CM

## 2020-07-31 DIAGNOSIS — M4712 Other spondylosis with myelopathy, cervical region: Secondary | ICD-10-CM

## 2020-07-31 DIAGNOSIS — M7918 Myalgia, other site: Secondary | ICD-10-CM

## 2020-07-31 DIAGNOSIS — G6289 Other specified polyneuropathies: Secondary | ICD-10-CM

## 2020-07-31 DIAGNOSIS — Z9889 Other specified postprocedural states: Secondary | ICD-10-CM

## 2020-07-31 DIAGNOSIS — D492 Neoplasm of unspecified behavior of bone, soft tissue, and skin: Secondary | ICD-10-CM

## 2020-07-31 DIAGNOSIS — G8929 Other chronic pain: Secondary | ICD-10-CM

## 2020-07-31 DIAGNOSIS — M4722 Other spondylosis with radiculopathy, cervical region: Secondary | ICD-10-CM

## 2020-07-31 NOTE — Telephone Encounter (Signed)
Pt called back and said he would like to go to [facility] at [place] [phone]. He would like a call back to confirm Dr. Darnell Level received this message.

## 2020-07-31 NOTE — Telephone Encounter (Signed)
Patient called.  Patient saw Dr Darnell Level on 07/19/20 and discussed neuropathy. Patient wants to know if he can get a referral for neuropathy. Patient said they, also, discussed seeing a Restaurant manager, fast food.  Patient is requesting referral to a doctor that deals with physical neuropathy and referral to Chiropractor for evaluation.  Patient said he can go to [place] or [place].  Patient prefers a morning appointment.

## 2020-08-02 NOTE — Addendum Note (Signed)
Addended by: Ria Bush on: 08/02/2020 05:10 PM   Modules accepted: Orders

## 2020-08-02 NOTE — Telephone Encounter (Addendum)
Spoke with patient. He already is planning to schedule chiro appt - discussed need to be cautious in his neck history.  Encouraged [facility] neuro f/u for neuropathy. He states he wants to stay local in [place] - new referral placed to [facility].

## 2020-08-07 NOTE — Telephone Encounter (Signed)
Noted  

## 2020-08-19 MED FILL — Morphine Sulfate Inj 2 MG/ML: INTRAMUSCULAR | Qty: 0.5 | Status: AC

## 2020-08-19 MED FILL — Acetaminophen Tab 325 MG: ORAL | Qty: 650 | Status: AC

## 2020-08-26 ENCOUNTER — Other Ambulatory Visit: Payer: Self-pay | Admitting: Family Medicine

## 2020-09-11 ENCOUNTER — Other Ambulatory Visit: Payer: Self-pay | Admitting: Family Medicine

## 2020-10-08 ENCOUNTER — Encounter: Payer: Self-pay | Admitting: Diagnostic Neuroimaging

## 2020-10-08 ENCOUNTER — Ambulatory Visit (INDEPENDENT_AMBULATORY_CARE_PROVIDER_SITE_OTHER): Payer: Medicare Other | Admitting: Diagnostic Neuroimaging

## 2020-10-08 ENCOUNTER — Other Ambulatory Visit: Payer: Self-pay

## 2020-10-08 VITALS — BP 123/73 | HR 82 | Ht 71.75 in | Wt 175.6 lb

## 2020-10-08 DIAGNOSIS — R202 Paresthesia of skin: Secondary | ICD-10-CM

## 2020-10-08 DIAGNOSIS — R200 Anesthesia of skin: Secondary | ICD-10-CM

## 2020-10-08 NOTE — Progress Notes (Signed)
[facility]  PATIENT: Mark Holmes DOB: 12-11-1935  REFERRING CLINICIAN: Ria Bush, MD HISTORY FROM: patient  REASON FOR VISIT: new consult    HISTORICAL  CHIEF COMPLAINT:  Chief Complaint  Patient presents with   Neuropathy    rm 6 New Pt  "hands feel like sandpaper; my lower legs get stiff/tight; had work up at [employer]; is long term use of Dilantin the cause?"    HISTORY OF PRESENT ILLNESS:   85 year old male here for evaluation of numbness, weakness, gait difficulty, history of seizure disorder.  February 2019 patient had onset of abnormal sensation in his right hand finger and thumb. He was having some weakness. He was having gait and balance difficulties. He had MRI of the cervical spine which showed severe spinal stenosis and cord compression and multilevel foraminal stenosis. He underwent ACDF at C3-4 level.  He was diagnosed with right carpal tunnel and left cubital tunnel syndromes. He underwent right carpal tunnel surgery.  Also diagnosed with lumbar spinal stenosis. Also had knee arthritis. He had a chronic right ankle fracture from a fall in 1998.  Also has history of seizures x2 in the 1980s. He has been on Dilantin since that time. He has intermittent memory lapses every 2 to 3 years but no convulsive seizures.  Patient has seen neurologists at [facility] as well as [facility].     REVIEW OF SYSTEMS: Full 14 system review of systems performed and negative with exception of: As per HPI.  ALLERGIES: Allergies  Allergen Reactions   Dilaudid [Hydromorphone Hcl] Other (See Comments)    hallucinations    HOME MEDICATIONS: Outpatient Medications Prior to Visit  Medication Sig Dispense Refill   aspirin-acetaminophen-caffeine (EXCEDRIN MIGRAINE) 250-250-65 MG per tablet Take 1 tablet by mouth 2 (two) times daily as needed.      atorvastatin (LIPITOR) 40 MG tablet Take 1 tablet (40 mg total) by mouth daily. 30  tablet 1   docusate sodium (DOK) 100 MG capsule Take 100 mg by mouth 2 (two) times daily.     dutasteride (AVODART) 0.5 MG capsule TAKE 1 CAPSULE BY MOUTH  EVERY OTHER DAY 45 capsule 0   hydrochlorothiazide (MICROZIDE) 12.5 MG capsule Take 1 capsule (12.5 mg total) by mouth daily. 90 capsule 3   naproxen (NAPROSYN) 500 MG tablet Take 1 tablet (500 mg total) by mouth as needed for mild pain, moderate pain or headache (Take 1 tablet twice daily as needed for your pain.). 60 tablet 2   phenytoin (DILANTIN) 100 MG ER capsule Take 2 capsules (200mg ) in the morning and 3 (300mg ) at night 450 capsule 3   Pyridoxine HCl (VITAMIN B-6 PO) Take 100 mg by mouth daily.      ramipril (ALTACE) 10 MG capsule Take 1 capsule (10 mg total) by mouth daily. 90 capsule 3   terazosin (HYTRIN) 10 MG capsule Take 1 capsule (10 mg total) by mouth daily. 90 capsule 3   tiZANidine (ZANAFLEX) 2 MG tablet Take 1 tablet (2 mg total) by mouth 2 (two) times daily as needed for muscle spasms (sedation precautions). 40 tablet 0   No facility-administered medications prior to visit.    PAST MEDICAL HISTORY: Past Medical History:  Diagnosis Date   Benign prostatic hypertrophy with elevated PSA    per Dr. Jacqlyn Larsen   History of CT scan of brain 1992   normal   Hyperglycemia 01/2006   102   Hyperlipemia    Hypertension    Seizure disorder (  [place])     PAST SURGICAL HISTORY: Past Surgical History:  Procedure Laterality Date   ANTERIOR CERVICAL DECOMP/DISCECTOMY FUSION  05/2018   for myelopathy -C3/4 ([facility])   CARPAL TUNNEL RELEASE Right 06/2019   ([facility])   CATARACT EXTRACTION W/ INTRAOCULAR LENS IMPLANT Right 2012   [facility]   History of EEG  1985   normal   hospitalization  1981   observation for seizuare   TONSILLECTOMY     TRANSURETHRAL RESECTION OF PROSTATE  10/2005   [name]   TRANSURETHRAL RESECTION OF PROSTATE  08/2017   (rpt) [facility]    FAMILY HISTORY: Family History   Problem Relation Age of Onset   Febrile seizures Sister    Cancer Sister        lung/smoker   CAD Neg Hx     SOCIAL HISTORY: Social History   Socioeconomic History   Marital status: Married    Spouse name: Sport and exercise psychologist   Number of children: 2   Years of education: Not on file   Highest education level: Master's degree (e.g., MA, MS, MEng, MEd, MSW, MBA)  Occupational History   Occupation: [employer] 2001    Comment: Information systems manager  Tobacco Use   Smoking status: Never Smoker   Smokeless tobacco: Never Used  Scientific laboratory technician Use: Never used  Substance and Sexual Activity   Alcohol use: No    Alcohol/week: 0.0 standard drinks   Drug use: No   Sexual activity: Yes  Other Topics Concern   Not on file  Social History Narrative   Caffeine: 1 cup/day, 2 excedrin/day   Married since 1959   2 daughters   Occupation: retired, was Banker)   Activity: yardwork, stays active with grandson (swimming)   Diet: avoids carbs, good water, fruits/vegetables daily   Social Determinants of Health   Financial Resource Strain:    Difficulty of Paying Living Expenses: Not on file  Food Insecurity:    Worried About Charity fundraiser in the Last Year: Not on file   [employer] of Food in the Last Year: Not on file  Transportation Needs:    Lack of Transportation (Medical): Not on file   Lack of Transportation (Non-Medical): Not on file  Physical Activity:    Days of Exercise per Week: Not on file   Minutes of Exercise per Session: Not on file  Stress:    Feeling of Stress : Not on file  Social Connections:    Frequency of Communication with Friends and Family: Not on file   Frequency of Social Gatherings with Friends and Family: Not on file   Attends Religious Services: Not on file   Active Member of Clubs or Organizations: Not on file   Attends Archivist Meetings: Not on file   Marital Status: Not on file  Intimate Partner  Violence:    Fear of Current or Ex-Partner: Not on file   Emotionally Abused: Not on file   Physically Abused: Not on file   Sexually Abused: Not on file     PHYSICAL EXAM  GENERAL EXAM/CONSTITUTIONAL: Vitals:  Vitals:   10/08/20 1309  BP: 123/73  Pulse: 82  Weight: 175 lb 9.6 oz (79.7 kg)  Height: 5' 11.75" (1.822 m)     Body mass index is 23.98 kg/m. Wt Readings from Last 3 Encounters:  10/08/20 175 lb 9.6 oz (79.7 kg)  07/19/20 176 lb 4 oz (79.9 kg)  07/10/20 169 lb 15.6 oz (  77.1 kg)     Patient is in no distress; well developed, nourished and groomed; neck is supple  CARDIOVASCULAR:  Examination of carotid arteries is normal; no carotid bruits  Regular rate and rhythm, no murmurs  Examination of peripheral vascular system by observation and palpation is normal  EYES:  Ophthalmoscopic exam of optic discs and posterior segments is normal; no papilledema or hemorrhages  No exam data present  MUSCULOSKELETAL:  Gait, strength, tone, movements noted in Neurologic exam below  NEUROLOGIC: MENTAL STATUS:  No flowsheet data found.  awake, alert, oriented to person, place and time  recent and remote memory intact  normal attention and concentration  language fluent, comprehension intact, naming intact  fund of knowledge appropriate  CRANIAL NERVE:   2nd - no papilledema on fundoscopic exam  2nd, 3rd, 4th, 6th - pupils equal and reactive to light, visual fields full to confrontation, extraocular muscles intact, no nystagmus  5th - facial sensation symmetric  7th - facial strength symmetric  8th - hearing intact  9th - palate elevates symmetrically, uvula midline  11th - shoulder shrug symmetric  12th - tongue protrusion midline  MOTOR:   normal bulk and tone, full strength in the BUE; BLE 4 PROX AND 5 DISTAL  SENSORY:   normal and symmetric to light touch, temperature, vibration; DECR IN FINGERS AND FEET / ANKLES /  KNEES  COORDINATION:   finger-nose-finger, fine finger movements --> MILD DYSMETRIA IN BUE  REFLEXES:   deep tendon reflexes --> BUE 2; BLE TRACE  GAIT/STATION:   narrow based gait; SLOW AND CAUTIOUS     DIAGNOSTIC DATA (LABS, IMAGING, TESTING) - I reviewed patient records, labs, notes, testing and imaging myself where available.  Lab Results  Component Value Date   WBC 7.9 07/11/2020   HGB 11.9 (L) 07/11/2020   HCT 33.0 (L) 07/11/2020   MCV 84.8 07/11/2020   PLT 245 07/11/2020      Component Value Date/Time   NA 140 07/11/2020 0432   K 3.6 07/11/2020 0432   CL 106 07/11/2020 0432   CO2 28 07/11/2020 0432   GLUCOSE 85 07/11/2020 0432   BUN 26 (H) 07/11/2020 0432   CREATININE 1.02 07/11/2020 0432   CALCIUM 9.0 07/11/2020 0432   PROT 6.4 (L) 07/11/2020 0432   ALBUMIN 3.7 07/11/2020 0432   AST 18 07/11/2020 0432   ALT 17 07/11/2020 0432   ALKPHOS 86 07/11/2020 0432   BILITOT 0.7 07/11/2020 0432   GFRNONAA >60 07/11/2020 0432   GFRAA >60 07/11/2020 0432   Lab Results  Component Value Date   CHOL 191 07/11/2020   HDL 39 (L) 07/11/2020   LDLCALC 123 (H) 07/11/2020   LDLDIRECT 147.4 02/22/2008   TRIG 145 07/11/2020   CHOLHDL 4.9 07/11/2020   No results found for: HGBA1C Lab Results  Component Value Date   VITAMINB12 506 11/07/2019   Lab Results  Component Value Date   TSH 0.431 07/11/2020    02/02/18 EMG/NCS Abnormal study. There is electrodiagnostic evidence of mild right mid cervical radiculopathy, very mild right carpal tunnel syndrome, and mild left ulnar neuropathy at the elbow.  06/07/19 EMG/NCS - bilateral mild carpal tunnel syndrome  04/22/18 MRI cervical spine [I reviewed printout of single sagittal image brought by patient, and agree with interpretation. -VRP]  - severe spinal stenosis at C3-4 with increased T2 signal - left C2 nerve sheath tumor  05/04/18 MRI thoracic spine  - moderate disc disease at T7-8, T9-10; no high grade canal or  foraminal stenosis   05/04/18 MRI lumbar spine - moderate-severe degenerative dz at L2-3, L3-4, L4-5 - moderate multi-level canal stenosis - severe right foraminal stenosis at L4-5  11/21/19 MRI cervical spine [I reviewed images myself and agree with interpretation. -VRP]  1. 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 No other intradural masses identified. 2. Interval ACDF at C3-4 without demonstrated complication. 3. Multilevel cervical spondylosis contributing to mild spinal stenosis from C3-4 through C7-T1. No cord deformity or abnormal cord signal. 4. Multilevel foraminal narrowing due to uncinate spurring and facet hypertrophy as described. This appears worst on the left at C6-7 and C7-T1.   ASSESSMENT AND PLAN  85 y.o. year old male here with:  Dx:  1. Bilateral hand numbness   2. Numbness and tingling of both feet       PLAN:  BILATERAL UPPER EXT WEAKNESS / NUMBNESS  - due to cervical radiculopathies (s/p ACDF C3-4) + bilateral carpal tunnel syndrome (s/p right CTS surgery) + left cubital tunnel syndrome - consider neurosurgery evaluation  BILATERAL LOWER EXT NUMBNESS / WEAKNESS - lumbar spine disease + neuropathy (dilantin)  - consider MRI lumbar spine  GAIT DIFFICULTY - cervical myelopathy + lumbar spine dz + neuropathy (dilantin) + cerebellar ataxia (dilantin) - recommend PT evaluation; use cane  SEIZURE DISORDER  - 2 seizures in 1980's; some intermittent memory lapses (?TGA vs petit mal seizures) - on long term dilantin since 1980's; consider to wean off vs change to levetiracetam   RIGHT ANKLE PAIN - consider orthopedic or sports medicine evaluation  Return for pending if symptoms worsen or fail to improve.    Penni Bombard, MD 40/97/3532, 9:92 PM Certified in Neurology,  Neurophysiology and Neuroimaging  [facility] [address] [place], [place] [address] [phone]

## 2020-10-08 NOTE — Patient Instructions (Signed)
  BILATERAL UPPER EXT WEAKNESS / NUMBNESS  - due to cervical radiculopathies (s/p ACDF C3-4) + bilateral carpal tunnel syndrome (s/p right CTS surgery) + left cubital tunnel syndrome - consider neurosurgery evaluation  BILATERAL LOWER EXT NUMBNESS / WEAKNESS - lumbar spine disease + neuropathy (dilantin)  - consider MRI lumbar spine  GAIT DIFFICULTY - cervical myelopathy + lumbar spine dz + neuropathy (dilantin) + cerebellar ataxia (dilantin) - recommend PT evaluation; use cane  SEIZURE DISORDER  - 2 seizures in 1980's; some intermittent memory lapses (?TGA vs petit mal seizures) - on long term dilantin since 1980's; consider to wean off vs change to levetiracetam   RIGHT ANKLE PAIN - consider orthopedic or sports medicine evaluation

## 2020-10-13 ENCOUNTER — Other Ambulatory Visit: Payer: Self-pay | Admitting: Family Medicine

## 2020-10-13 DIAGNOSIS — E559 Vitamin D deficiency, unspecified: Secondary | ICD-10-CM

## 2020-10-13 DIAGNOSIS — G40909 Epilepsy, unspecified, not intractable, without status epilepticus: Secondary | ICD-10-CM

## 2020-10-13 DIAGNOSIS — E785 Hyperlipidemia, unspecified: Secondary | ICD-10-CM

## 2020-10-15 ENCOUNTER — Other Ambulatory Visit: Payer: Medicare Other

## 2020-10-22 ENCOUNTER — Ambulatory Visit (INDEPENDENT_AMBULATORY_CARE_PROVIDER_SITE_OTHER): Payer: Medicare Other | Admitting: Family Medicine

## 2020-10-22 ENCOUNTER — Other Ambulatory Visit: Payer: Self-pay

## 2020-10-22 ENCOUNTER — Encounter: Payer: Self-pay | Admitting: Family Medicine

## 2020-10-22 VITALS — BP 146/70 | HR 80 | Temp 97.5°F | Ht 67.0 in | Wt 176.5 lb

## 2020-10-22 DIAGNOSIS — G40909 Epilepsy, unspecified, not intractable, without status epilepticus: Secondary | ICD-10-CM | POA: Diagnosis not present

## 2020-10-22 DIAGNOSIS — M4722 Other spondylosis with radiculopathy, cervical region: Secondary | ICD-10-CM

## 2020-10-22 DIAGNOSIS — E785 Hyperlipidemia, unspecified: Secondary | ICD-10-CM

## 2020-10-22 DIAGNOSIS — M4712 Other spondylosis with myelopathy, cervical region: Secondary | ICD-10-CM | POA: Diagnosis not present

## 2020-10-22 DIAGNOSIS — D492 Neoplasm of unspecified behavior of bone, soft tissue, and skin: Secondary | ICD-10-CM

## 2020-10-22 DIAGNOSIS — R011 Cardiac murmur, unspecified: Secondary | ICD-10-CM

## 2020-10-22 DIAGNOSIS — N401 Enlarged prostate with lower urinary tract symptoms: Secondary | ICD-10-CM

## 2020-10-22 DIAGNOSIS — Z0000 Encounter for general adult medical examination without abnormal findings: Secondary | ICD-10-CM

## 2020-10-22 DIAGNOSIS — G8929 Other chronic pain: Secondary | ICD-10-CM

## 2020-10-22 DIAGNOSIS — R270 Ataxia, unspecified: Secondary | ICD-10-CM

## 2020-10-22 DIAGNOSIS — Z0101 Encounter for examination of eyes and vision with abnormal findings: Secondary | ICD-10-CM

## 2020-10-22 DIAGNOSIS — M25571 Pain in right ankle and joints of right foot: Secondary | ICD-10-CM

## 2020-10-22 DIAGNOSIS — I10 Essential (primary) hypertension: Secondary | ICD-10-CM

## 2020-10-22 DIAGNOSIS — N138 Other obstructive and reflux uropathy: Secondary | ICD-10-CM

## 2020-10-22 DIAGNOSIS — G6289 Other specified polyneuropathies: Secondary | ICD-10-CM

## 2020-10-22 MED ORDER — TERAZOSIN HCL 10 MG PO CAPS: 10.0000 mg | ORAL_CAPSULE | Freq: Every day | ORAL | 3 refills | Status: DC

## 2020-10-22 MED ORDER — RAMIPRIL 10 MG PO CAPS: 10.0000 mg | ORAL_CAPSULE | Freq: Every day | ORAL | 3 refills | Status: DC

## 2020-10-22 MED ORDER — HYDROCHLOROTHIAZIDE 12.5 MG PO CAPS: 12.5000 mg | ORAL_CAPSULE | Freq: Every day | ORAL | 3 refills | Status: DC

## 2020-10-22 MED ORDER — ATORVASTATIN CALCIUM 20 MG PO TABS: 20.0000 mg | ORAL_TABLET | Freq: Every day | ORAL | 3 refills | Status: DC

## 2020-10-22 NOTE — Patient Instructions (Addendum)
Let's drop atorvastatin dose to 20mg  daily - as you did have plaque in your thoracic aorta and calcifications in your coronary arteries - this will help prevent progression of plaque buildup - continue as long as tolerating medicine ok.  Call to schedule appointment with Dr Lorelei Pont our sports medicine.  We will refer you to neurosurgeon in [place].  Happy early birthday!  Return as needed or in 4 months for follow up visit.   Health Maintenance After Age 17 After age 47, you are at a higher risk for certain long-term diseases and infections as well as injuries from falls. Falls are a major cause of broken bones and head injuries in people who are older than age 46. Getting regular preventive care can help to keep you healthy and well. Preventive care includes getting regular testing and making lifestyle changes as recommended by your health care provider. Talk with your health care provider about:  Which screenings and tests you should have. A screening is a test that checks for a disease when you have no symptoms.  A diet and exercise plan that is right for you. What should I know about screenings and tests to prevent falls? Screening and testing are the best ways to find a health problem early. Early diagnosis and treatment give you the best chance of managing medical conditions that are common after age 86. Certain conditions and lifestyle choices may make you more likely to have a fall. Your health care provider may recommend:  Regular vision checks. Poor vision and conditions such as cataracts can make you more likely to have a fall. If you wear glasses, make sure to get your prescription updated if your vision changes.  Medicine review. Work with your health care provider to regularly review all of the medicines you are taking, including over-the-counter medicines. Ask your health care provider about any side effects that may make you more likely to have a fall. Tell your health care  provider if any medicines that you take make you feel dizzy or sleepy.  Osteoporosis screening. Osteoporosis is a condition that causes the bones to get weaker. This can make the bones weak and cause them to break more easily.  Blood pressure screening. Blood pressure changes and medicines to control blood pressure can make you feel dizzy.  Strength and balance checks. Your health care provider may recommend certain tests to check your strength and balance while standing, walking, or changing positions.  Foot health exam. Foot pain and numbness, as well as not wearing proper footwear, can make you more likely to have a fall.  Depression screening. You may be more likely to have a fall if you have a fear of falling, feel emotionally low, or feel unable to do activities that you used to do.  Alcohol use screening. Using too much alcohol can affect your balance and may make you more likely to have a fall. What actions can I take to lower my risk of falls? General instructions  Talk with your health care provider about your risks for falling. Tell your health care provider if: ? You fall. Be sure to tell your health care provider about all falls, even ones that seem minor. ? You feel dizzy, sleepy, or off-balance.  Take over-the-counter and prescription medicines only as told by your health care provider. These include any supplements.  Eat a healthy diet and maintain a healthy weight. A healthy diet includes low-fat dairy products, low-fat (lean) meats, and fiber from whole grains, beans,  and lots of fruits and vegetables. Home safety  Remove any tripping hazards, such as rugs, cords, and clutter.  Install safety equipment such as grab bars in bathrooms and safety rails on stairs.  Keep rooms and walkways well-lit. Activity   Follow a regular exercise program to stay fit. This will help you maintain your balance. Ask your health care provider what types of exercise are appropriate for  you.  If you need a cane or walker, use it as recommended by your health care provider.  Wear supportive shoes that have nonskid soles. Lifestyle  Do not drink alcohol if your health care provider tells you not to drink.  If you drink alcohol, limit how much you have: ? 0-1 drink a day for women. ? 0-2 drinks a day for men.  Be aware of how much alcohol is in your drink. In the [place] one drink equals one typical bottle of beer (12 oz), one-half glass of wine (5 oz), or one shot of hard liquor (1 oz).  Do not use any products that contain nicotine or tobacco, such as cigarettes and e-cigarettes. If you need help quitting, ask your health care provider. Summary  Having a healthy lifestyle and getting preventive care can help to protect your health and wellness after age 4.  Screening and testing are the best way to find a health problem early and help you avoid having a fall. Early diagnosis and treatment give you the best chance for managing medical conditions that are more common for people who are older than age 73.  Falls are a major cause of broken bones and head injuries in people who are older than age 44. Take precautions to prevent a fall at home.  Work with your health care provider to learn what changes you can make to improve your health and wellness and to prevent falls. This information is not intended to replace advice given to you by your health care provider. Make sure you discuss any questions you have with your health care provider. Document Revised: 03/30/2019 Document Reviewed: 10/20/2017 Elsevier Patient Education  2020 [employer].

## 2020-10-22 NOTE — Progress Notes (Signed)
This visit was conducted in person.  BP (!) 146/70 (BP Location: Left Arm, Patient Position: Sitting, Cuff Size: Normal)   Pulse 80   Temp (!) 97.5 F (36.4 C) (Temporal)   Ht 5\' 7"  (1.702 m)   Wt 176 lb 8 oz (80.1 kg)   SpO2 97%   BMI 27.64 kg/m    CC: AMW/CPE Subjective:    Patient ID: Mark Holmes, male    DOB: March 17, 1935, 85 y.o.   MRN: [id number]  HPI: Mark Holmes is a 85 y.o. male presenting on 10/22/2020 for Medicare Wellness (Wants to discuss atorvastatin. )   Did not see health coach this year.    Hearing Screening   125Hz  250Hz  500Hz  1000Hz  2000Hz  3000Hz  4000Hz  6000Hz  8000Hz   Right ear:   40 40 40  0    Left ear:   20 25 0  0      Visual Acuity Screening   Right eye Left eye Both eyes  Without correction:     With correction: 20/50 20/25 20/25   Denies hearing difficulty. Regularly sees eye doctor.    Office Visit from 10/22/2020 in [facility]  PHQ-2 Total Score 1      Fall Risk  10/22/2020 10/08/2020 07/10/2019 07/04/2018 07/02/2017  Falls in the past year? 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  S/p R CTS release surgery 06/21/2019 - with some persistent hand paresthesias.   Saw neurology ([place]) 09/2020 - suggested neurosurgery evaluation for bilateral upper and lower extremity weakness/numbness, gait difficulty possibly coming from dilantin (neuropathy + cerebellar ataxia) - rec PT and cane use, consider change in dilantin. Patient doesn't feel he needs cane.   Was recommended to see sports med for chronic R ankle pain. Uses air cast brace with benefit. H/o remote R ankle fracture.   Seizure disorder on chronic dilantin.   HTN - hctz 12.5mg  daily, ramipril 10mg  daily, terazosin 10mg  nightly.  HLD - atorvastatin started 06/2020 due to mildly elevated cholesterol levels. No fmhx CAD/stroke. Had cardiac eval at hospitalization over  summer.   Preventative: Colon cancer screening - has not had colonoscopybefore. Reassuring iFOBs previously.Decided to age out. Prostate -has seenuro (Dr. Marti Sleigh who has left)- last year established locally ([place]). H/o BPHwith elevated PSA s/p TURPon avodart and terazosin. Does get PSA checkedby them. Sees urology regularly.  Flu shot - does not receive. Pneumovax 2011, prevnar 2015. Tetanus shot Td 2011.  [place] completed 12/2019, 01/2020  Shingrix - h/o shingles. Declines. Advanced directives: has living will at home. HCPOA would be daughter. Would be ok with CPR and temporary trial of life support if reversible condition. Does not want prolonged life support. Does not want copy in our chart at this time - states daughter will be available Seat belt use discussed.  Sunscreen use discussed. No changing moles. Non smoker Alcohol - rare Dentist - yearly Eye exam - yearly Bowel - no constipation Bladder - no incontinence  Caffeine: 1 cup/day, 2 excedrin/day  Married since 1959  2 daughters  Occupation: retired, was Banker)  Activity: walking dog, stays active with grandson (swimming)  Diet: avoids carbs, good water, fruits/vegetables daily     Relevant past medical, surgical, family and social history reviewed and updated as indicated. Interim medical history since our last visit reviewed. Allergies and medications reviewed and updated. Outpatient Medications Prior  to Visit  Medication Sig Dispense Refill  . aspirin-acetaminophen-caffeine (EXCEDRIN MIGRAINE) 250-250-65 MG per tablet Take 1 tablet by mouth 2 (two) times daily as needed.     . docusate sodium (DOK) 100 MG capsule Take 100 mg by mouth 2 (two) times daily.    Marland Kitchen dutasteride (AVODART) 0.5 MG capsule TAKE 1 CAPSULE BY MOUTH  EVERY OTHER DAY 45 capsule 0  . naproxen (NAPROSYN) 500 MG tablet Take 1 tablet (500 mg total) by mouth as needed for mild pain,  moderate pain or headache (Take 1 tablet twice daily as needed for your pain.). 60 tablet 2  . phenytoin (DILANTIN) 100 MG ER capsule Take 2 capsules (200mg ) in the morning and 3 (300mg ) at night 450 capsule 3  . Pyridoxine HCl (VITAMIN B-6 PO) Take 100 mg by mouth daily.     Marland Kitchen tiZANidine (ZANAFLEX) 2 MG tablet Take 1 tablet (2 mg total) by mouth 2 (two) times daily as needed for muscle spasms (sedation precautions). 40 tablet 0  . atorvastatin (LIPITOR) 40 MG tablet Take 1 tablet (40 mg total) by mouth daily. 30 tablet 1  . hydrochlorothiazide (MICROZIDE) 12.5 MG capsule Take 1 capsule (12.5 mg total) by mouth daily. 90 capsule 3  . ramipril (ALTACE) 10 MG capsule Take 1 capsule (10 mg total) by mouth daily. 90 capsule 3  . terazosin (HYTRIN) 10 MG capsule Take 1 capsule (10 mg total) by mouth daily. 90 capsule 3   No facility-administered medications prior to visit.     Per HPI unless specifically indicated in ROS section below Review of Systems  Constitutional: Negative for activity change, appetite change, chills, fatigue, fever and unexpected weight change.  HENT: Negative for hearing loss.   Eyes: Negative for visual disturbance.  Respiratory: Negative for cough, chest tightness, shortness of breath and wheezing.   Cardiovascular: Positive for leg swelling (in mornings). Negative for chest pain and palpitations.  Gastrointestinal: Negative for abdominal distention, abdominal pain, blood in stool, constipation, diarrhea, nausea and vomiting.  Genitourinary: Negative for difficulty urinating and hematuria.  Musculoskeletal: Negative for arthralgias, myalgias and neck pain.  Skin: Negative for rash.  Neurological: Positive for weakness and numbness. Negative for dizziness, seizures, syncope and headaches.  Hematological: Negative for adenopathy. Does not bruise/bleed easily.  Psychiatric/Behavioral: Negative for dysphoric mood. The patient is not nervous/anxious.    Objective:  BP (!)  146/70 (BP Location: Left Arm, Patient Position: Sitting, Cuff Size: Normal)   Pulse 80   Temp (!) 97.5 F (36.4 C) (Temporal)   Ht 5\' 7"  (1.702 m)   Wt 176 lb 8 oz (80.1 kg)   SpO2 97%   BMI 27.64 kg/m   Wt Readings from Last 3 Encounters:  10/22/20 176 lb 8 oz (80.1 kg)  10/08/20 175 lb 9.6 oz (79.7 kg)  07/19/20 176 lb 4 oz (79.9 kg)      Physical Exam Vitals and nursing note reviewed.  Constitutional:      General: He is not in acute distress.    Appearance: Normal appearance. He is well-developed. He is not ill-appearing.  HENT:     Head: Normocephalic and atraumatic.     Right Ear: Hearing, tympanic membrane, ear canal and external ear normal.     Left Ear: Hearing, tympanic membrane, ear canal and external ear normal.  Eyes:     General: No scleral icterus.    Extraocular Movements: Extraocular movements intact.     Conjunctiva/sclera: Conjunctivae normal.     Pupils: Pupils  are equal, round, and reactive to light.  Neck:     Thyroid: No thyroid mass or thyromegaly.     Vascular: No carotid bruit.  Cardiovascular:     Rate and Rhythm: Normal rate and regular rhythm.     Pulses: Normal pulses.          Radial pulses are 2+ on the right side and 2+ on the left side.     Heart sounds: Murmur (USB - known aortic sclerosis without stenosis) heard.   Pulmonary:     Effort: Pulmonary effort is normal. No respiratory distress.     Breath sounds: Normal breath sounds. No wheezing, rhonchi or rales.  Abdominal:     General: Abdomen is flat. Bowel sounds are normal. There is no distension.     Palpations: Abdomen is soft. There is no mass.     Tenderness: There is no abdominal tenderness. There is no guarding or rebound.     Hernia: No hernia is present.  Musculoskeletal:        General: Normal range of motion.     Cervical back: Normal range of motion and neck supple.     Right lower leg: No edema.     Left lower leg: No edema.  Lymphadenopathy:     Cervical: No  cervical adenopathy.  Skin:    General: Skin is warm and dry.     Capillary Refill: Capillary refill takes less than 2 seconds.     Findings: No rash.  Neurological:     General: No focal deficit present.     Mental Status: He is alert and oriented to person, place, and time.     Comments:  CN grossly intact, station and gait intact Recall 3/3  Calculation 5/5 DLROW  Psychiatric:        Mood and Affect: Mood normal.        Behavior: Behavior normal.        Thought Content: Thought content normal.        Judgment: Judgment normal.       Results for orders placed or performed during the hospital encounter of 07/10/20  SARS Coronavirus 2 by RT PCR (hospital order, performed in [place] hospital lab) Nasopharyngeal Nasopharyngeal Swab   Specimen: Nasopharyngeal Swab  Result Value Ref Range   SARS Coronavirus 2 NEGATIVE NEGATIVE  Basic metabolic panel  Result Value Ref Range   Sodium 139 135 - 145 mmol/L   Potassium 3.9 3.5 - 5.1 mmol/L   Chloride 105 98 - 111 mmol/L   CO2 26 22 - 32 mmol/L   Glucose, Bld 97 70 - 99 mg/dL   BUN 27 (H) 8 - 23 mg/dL   Creatinine, Ser 1.17 0.61 - 1.24 mg/dL   Calcium 9.2 8.9 - 10.3 mg/dL   GFR calc non Af Amer 58 (L) >60 mL/min   GFR calc Af Amer >60 >60 mL/min   Anion gap 8 5 - 15  CBC  Result Value Ref Range   WBC 7.1 4.0 - 10.5 K/uL   RBC 3.89 (L) 4.22 - 5.81 MIL/uL   Hemoglobin 11.6 (L) 13.0 - 17.0 g/dL   HCT 34.0 (L) 39 - 52 %   MCV 87.4 80.0 - 100.0 fL   MCH 29.8 26.0 - 34.0 pg   MCHC 34.1 30.0 - 36.0 g/dL   RDW 13.6 11.5 - 15.5 %   Platelets 236 150 - 400 K/uL   nRBC 0.0 0.0 - 0.2 %  Brain natriuretic peptide  Result Value Ref Range   B Natriuretic Peptide 42.8 0.0 - 100.0 pg/mL  Hepatic function panel  Result Value Ref Range   Total Protein 6.7 6.5 - 8.1 g/dL   Albumin 3.9 3.5 - 5.0 g/dL   AST 20 15 - 41 U/L   ALT 18 0 - 44 U/L   Alkaline Phosphatase 90 38 - 126 U/L   Total Bilirubin 0.7 0.3 - 1.2 mg/dL   Bilirubin,  Direct <0.1 0.0 - 0.2 mg/dL   Indirect Bilirubin NOT CALCULATED 0.3 - 0.9 mg/dL  Lipid panel  Result Value Ref Range   Cholesterol 191 0 - 200 mg/dL   Triglycerides 145 <150 mg/dL   HDL 39 (L) >40 mg/dL   Total CHOL/HDL Ratio 4.9 RATIO   VLDL 29 0 - 40 mg/dL   LDL Cholesterol 123 (H) 0 - 99 mg/dL  Fibrin derivatives D-Dimer ([facility] only)  Result Value Ref Range   Fibrin derivatives D-dimer ([facility]) 751.97 (H) 0.00 - 499.00 ng/mL (FEU)  Phenytoin level, total  Result Value Ref Range   Phenytoin Lvl 14.1 10.0 - 20.0 ug/mL  Magnesium  Result Value Ref Range   Magnesium 2.1 1.7 - 2.4 mg/dL  Phosphorus  Result Value Ref Range   Phosphorus 3.5 2.5 - 4.6 mg/dL  CBC WITH DIFFERENTIAL  Result Value Ref Range   WBC 7.9 4.0 - 10.5 K/uL   RBC 3.89 (L) 4.22 - 5.81 MIL/uL   Hemoglobin 11.9 (L) 13.0 - 17.0 g/dL   HCT 33.0 (L) 39 - 52 %   MCV 84.8 80.0 - 100.0 fL   MCH 30.6 26.0 - 34.0 pg   MCHC 36.1 (H) 30.0 - 36.0 g/dL   RDW 13.5 11.5 - 15.5 %   Platelets 245 150 - 400 K/uL   nRBC 0.0 0.0 - 0.2 %   Neutrophils Relative % 68 %   Neutro Abs 5.3 1.7 - 7.7 K/uL   Lymphocytes Relative 20 %   Lymphs Abs 1.6 0.7 - 4.0 K/uL   Monocytes Relative 9 %   Monocytes Absolute 0.7 0.1 - 1.0 K/uL   Eosinophils Relative 2 %   Eosinophils Absolute 0.2 0.0 - 0.5 K/uL   Basophils Relative 1 %   Basophils Absolute 0.1 0.0 - 0.1 K/uL   Immature Granulocytes 0 %   Abs Immature Granulocytes 0.03 0.00 - 0.07 K/uL  TSH  Result Value Ref Range   TSH 0.431 0.350 - 4.500 uIU/mL  Comprehensive metabolic panel  Result Value Ref Range   Sodium 140 135 - 145 mmol/L   Potassium 3.6 3.5 - 5.1 mmol/L   Chloride 106 98 - 111 mmol/L   CO2 28 22 - 32 mmol/L   Glucose, Bld 85 70 - 99 mg/dL   BUN 26 (H) 8 - 23 mg/dL   Creatinine, Ser 1.02 0.61 - 1.24 mg/dL   Calcium 9.0 8.9 - 10.3 mg/dL   Total Protein 6.4 (L) 6.5 - 8.1 g/dL   Albumin 3.7 3.5 - 5.0 g/dL   AST 18 15 - 41 U/L   ALT 17 0 - 44 U/L   Alkaline  Phosphatase 86 38 - 126 U/L   Total Bilirubin 0.7 0.3 - 1.2 mg/dL   GFR calc non Af Amer >60 >60 mL/min   GFR calc Af Amer >60 >60 mL/min   Anion gap 6 5 - 15  ECHOCARDIOGRAM COMPLETE  Result Value Ref Range   Weight 2,719.59 oz   Height 67 in   BP 134/70 mmHg  Ao pk vel 1.31 m/s   AV Area VTI 2.29 cm2   AR max vel 1.82 cm2   AV Mean grad 3.8 mmHg   AV Peak grad 6.8 mmHg   S' Lateral 2.57 cm   AV Area mean vel 1.69 cm2   Area-P 1/2 3.07 cm2  Troponin I (High Sensitivity)  Result Value Ref Range   Troponin I (High Sensitivity) 4 <18 ng/L  Troponin I (High Sensitivity)  Result Value Ref Range   Troponin I (High Sensitivity) 3 <18 ng/L   Lab Results  Component Value Date   VITAMINB12 506 11/07/2019    Assessment & Plan:  This visit occurred during the SARS-CoV-2 public health emergency.  Safety protocols were in place, including screening questions prior to the visit, additional usage of staff PPE, and extensive cleaning of exam room while observing appropriate contact time as indicated for disinfecting solutions.   Problem List Items Addressed This Visit    Systolic murmur    Known aortic sclerosis murmur on echocardiogram 06/2020.       Seizure disorder ([place])    Remote seizures, chronically on phenytoin. Saw neurology - ?contribution of dilantin to neuropathy and cerebellar ataxia - to consider transition to keppra.       Peripheral neuropathy    Appreciate neurology care.  Saw Dr Leta Baptist who questioned dilantin effect on ataxia and neuropathy.  Will first await neurosurgery eval, then consider AED transition through neurology.       Nerve sheath tumor    Patient saw Dr Scarlette Calico at [facility] 12/2019, at that time plan was rpt cervical MRI 6 mo to monitor schwannoma, but it seems pt was lost to f/u. Will refer back to Dr Ronnald Ramp to re establish care. Will see if pt willing to undergo rpt cervical MRI prior to appt.       Medicare annual wellness visit,  subsequent - Primary    I have personally reviewed the Medicare Annual Wellness questionnaire and have noted 1. The patient's medical and social history 2. Their use of alcohol, tobacco or illicit drugs 3. Their current medications and supplements 4. The patient's functional ability including ADL's, fall risks, home safety risks and hearing or visual impairment. Cognitive function has been assessed and addressed as indicated.  5. Diet and physical activity 6. Evidence for depression or mood disorders The patients weight, height, BMI have been recorded in the chart. I have made referrals, counseling and provided education to the patient based on review of the above and I have provided the pt with a written personalized care plan for preventive services. Provider list updated.. See scanned questionairre as needed for further documentation. Reviewed preventative protocols and updated unless pt declined.       HLD (hyperlipidemia)    Mild HLD in the past. However recent imaging and cards consult during last hospitalization recommended LDL goal <70 given thoracic aorta ATH disease and calcifications in CA's. Discussed benefits/risks of statin - will continue at lower dose of 20mg  daily.       Relevant Medications   ramipril (ALTACE) 10 MG capsule   terazosin (HYTRIN) 10 MG capsule   hydrochlorothiazide (MICROZIDE) 12.5 MG capsule   atorvastatin (LIPITOR) 20 MG tablet   Health maintenance examination    Preventative protocols reviewed and updated unless pt declined. Discussed healthy diet and lifestyle.       Failed vision screen    Recently saw eye doctor, still has trouble with R vision.  Essential hypertension    Mildly elevated. Previously low. No changes made today - continue hctz, ramipril, terazosin.       Relevant Medications   ramipril (ALTACE) 10 MG capsule   terazosin (HYTRIN) 10 MG capsule   hydrochlorothiazide (MICROZIDE) 12.5 MG capsule   atorvastatin (LIPITOR)  20 MG tablet   Chronic ankle pain    Chronic pain after fall out of [place] 1993.  Managed with air cast brace which he feels provides further support/stability. Requests sports medicine evaluation to ensure no better treatment options.       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  Saw neurology, reasonable to refer to neurosurg for second opinion.       Benign prostatic hyperplasia with urinary obstruction    Followed by urology, on avodart and terazosin.       Ataxia    ?dilantin vs myelopathy           Meds ordered this encounter  Medications  . ramipril (ALTACE) 10 MG capsule    Sig: Take 1 capsule (10 mg total) by mouth daily.    Dispense:  90 capsule    Refill:  3  . terazosin (HYTRIN) 10 MG capsule    Sig: Take 1 capsule (10 mg total) by mouth daily.    Dispense:  90 capsule    Refill:  3  . hydrochlorothiazide (MICROZIDE) 12.5 MG capsule    Sig: Take 1 capsule (12.5 mg total) by mouth daily.    Dispense:  90 capsule    Refill:  3  . atorvastatin (LIPITOR) 20 MG tablet    Sig: Take 1 tablet (20 mg total) by mouth daily.    Dispense:  90 tablet    Refill:  3   No orders of the defined types were placed in this encounter.   Patient instructions: Let's drop atorvastatin dose to 20mg  daily - as you did have plaque in your thoracic aorta and calcifications in your coronary arteries - this will help prevent progression of plaque buildup - continue as long as tolerating medicine ok.  Call to schedule appointment with Dr Lorelei Pont our sports medicine.  We will refer you to neurosurgeon in [place].  Happy early birthday!  Return as needed or in 4 months for follow up visit.   Follow up plan: Return in about 4 months (around 02/19/2021) for follow up visit.  Ria Bush, MD

## 2020-10-22 NOTE — Assessment & Plan Note (Signed)

## 2020-10-22 NOTE — Assessment & Plan Note (Signed)
Preventative protocols reviewed and updated unless pt declined. Discussed healthy diet and lifestyle.  

## 2020-10-22 NOTE — Assessment & Plan Note (Signed)
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  Saw neurology, reasonable to refer to neurosurg for second opinion.

## 2020-10-22 NOTE — Assessment & Plan Note (Signed)
Remote seizures, chronically on phenytoin. Saw neurology - ?contribution of dilantin to neuropathy and cerebellar ataxia - to consider transition to keppra.

## 2020-10-22 NOTE — Assessment & Plan Note (Signed)
Mildly elevated. Previously low. No changes made today - continue hctz, ramipril, terazosin.

## 2020-10-22 NOTE — Assessment & Plan Note (Addendum)
Mild HLD in the past. However recent imaging and cards consult during last hospitalization recommended LDL goal <70 given thoracic aorta ATH disease and calcifications in CA's. Discussed benefits/risks of statin - will continue at lower dose of 20mg  daily.

## 2020-10-23 ENCOUNTER — Ambulatory Visit: Payer: Medicare Other | Admitting: Family Medicine

## 2020-10-23 ENCOUNTER — Encounter: Payer: Self-pay | Admitting: Family Medicine

## 2020-10-23 ENCOUNTER — Ambulatory Visit (INDEPENDENT_AMBULATORY_CARE_PROVIDER_SITE_OTHER)
Admission: RE | Admit: 2020-10-23 | Discharge: 2020-10-23 | Disposition: A | Discharge status: Home / Self Care | Payer: Medicare Other | Source: Ambulatory Visit | Attending: Family Medicine | Admitting: Family Medicine

## 2020-10-23 ENCOUNTER — Telehealth: Payer: Self-pay | Admitting: Family Medicine

## 2020-10-23 ENCOUNTER — Other Ambulatory Visit: Payer: Self-pay

## 2020-10-23 VITALS — BP 110/60 | HR 61 | Temp 97.9°F | Ht 67.0 in | Wt 178.2 lb

## 2020-10-23 DIAGNOSIS — G6289 Other specified polyneuropathies: Secondary | ICD-10-CM

## 2020-10-23 DIAGNOSIS — M19171 Post-traumatic osteoarthritis, right ankle and foot: Secondary | ICD-10-CM | POA: Diagnosis not present

## 2020-10-23 DIAGNOSIS — D492 Neoplasm of unspecified behavior of bone, soft tissue, and skin: Secondary | ICD-10-CM

## 2020-10-23 DIAGNOSIS — M25671 Stiffness of right ankle, not elsewhere classified: Secondary | ICD-10-CM | POA: Diagnosis not present

## 2020-10-23 DIAGNOSIS — M4722 Other spondylosis with radiculopathy, cervical region: Secondary | ICD-10-CM

## 2020-10-23 DIAGNOSIS — M4712 Other spondylosis with myelopathy, cervical region: Secondary | ICD-10-CM

## 2020-10-23 MED ORDER — NAPROXEN 500 MG PO TABS: 500.0000 mg | ORAL_TABLET | ORAL | 2 refills | Status: AC | PRN

## 2020-10-23 NOTE — Assessment & Plan Note (Signed)
Patient saw Dr Scarlette Calico at [facility] 12/2019, at that time plan was rpt cervical MRI 6 mo to monitor schwannoma, but it seems pt was lost to f/u. Will refer back to Dr Ronnald Ramp to re establish care. Will see if pt willing to undergo rpt cervical MRI prior to appt.

## 2020-10-23 NOTE — Telephone Encounter (Signed)
plz call pt - I reviewed note from Dr Ronnald Ramp neurosurgeon in [place] when he saw him 12/2019. I have referred back to him. Plan at that time was PT and repeat cervical MRI in 6 months to monitor size of nerve sheath tumor. Is he interested in rpt PT course? Also, I'd like to schedule MRI before his neurosurgery appointment. If he agrees, let me know and I will order. Last cervical MRI was 11/2019.

## 2020-10-23 NOTE — Patient Instructions (Signed)
[email or web address]  Do the calf strain rehab, not the ankle rehab  I want you to get a   [place] - you can get them on [email or web address] for about 20 dollars

## 2020-10-23 NOTE — Assessment & Plan Note (Signed)
Chronic pain after fall out of [place] 1993.  Managed with air cast brace which he feels provides further support/stability. Requests sports medicine evaluation to ensure no better treatment options.

## 2020-10-23 NOTE — Telephone Encounter (Signed)
That's the only neurosurgery office in [place].  Recommend he return to see Dr Ronnald Ramp.  PT referral placed to [facility].  rec start with cervical MRI - ordered.  I spoke with patient regarding above at [place] with Dr Lorelei Pont today.

## 2020-10-23 NOTE — Assessment & Plan Note (Addendum)
Appreciate neurology care.  Saw Dr Leta Baptist who questioned dilantin effect on ataxia and neuropathy.  Will first await neurosurgery eval, then consider AED transition through neurology.

## 2020-10-23 NOTE — Assessment & Plan Note (Signed)
?  dilantin vs myelopathy

## 2020-10-23 NOTE — Assessment & Plan Note (Signed)
Recently saw eye doctor, still has trouble with R vision.

## 2020-10-23 NOTE — Assessment & Plan Note (Signed)
Followed by urology, on avodart and terazosin.

## 2020-10-23 NOTE — Telephone Encounter (Signed)
Spoke with pt relaying Dr. Synthia Innocent message.  Pt verbalizes understanding.  Agrees to another course of PT, but with [facility].  And states he did not like [facility].  He's ok to be seen in [place] but somewhere else for MRI and wants to include lower back.

## 2020-10-23 NOTE — Assessment & Plan Note (Signed)
Known aortic sclerosis murmur on echocardiogram 06/2020.

## 2020-10-23 NOTE — Progress Notes (Signed)
Mark Munter T. Fedrick Cefalu, MD, [place]  [facility] [place] at [facility] [place], [address]  Phone: [phone]  FAX: [phone]  MCKINNON GLICK - 85 y.o. male  MRN [id number]  Date of Birth: May 28, 1935  Date: 10/23/2020  PCP: Ria Bush, MD  Referral: Ria Bush, MD  Chief Complaint  Patient presents with  . Ankle Pain    Right-Ankle Injury back in 1998    This visit occurred during the SARS-CoV-2 public health emergency.  Safety protocols were in place, including screening questions prior to the visit, additional usage of staff PPE, and extensive cleaning of exam room while observing appropriate contact time as indicated for disinfecting solutions.   Subjective:   Mark Holmes is a 85 y.o. very pleasant male patient with Body mass index is 27.92 kg/m. who presents with the following:  Very distant ankle fracture in 1998:  Came back from [place], went to [place].  Had a bucket of cherries, and he went up on a ladder.  Hurt really bad and off and on, and [employer] gave a cam walker.   He had delayed treatment and he tells me that he had some significant pain prior to medical evaluation.  He has had some persistent pain off and on for years, and his ankle is quite stiff. He does wear an Aircast for support, and he is actually been wearing this for many years. He wears this even at sleep. Does feel as if it gives him great pressure.  He has not had any recent falls or other additional injuries.  Review of Systems is noted in the HPI, as appropriate   Objective:   BP 110/60   Pulse 61   Temp 97.9 F (36.6 C) (Temporal)   Ht 5\' 7"  (1.702 m)   Wt 178 lb 4 oz (80.9 kg)   SpO2 95%   BMI 27.92 kg/m    ANKLE: R Echymosis: no Edema: no ROM: Full dorsi and plantar flexion, inversion, eversion: Decidedly impaired loss of motion in all directions, worst in inversion and  eversion. Approximate 40% loss. Pain with all terminal motion. Gait: heel toe, antalgic Lateral Mall: NT Medial Mall: NT Talus: Tender Navicular: NT Cuboid: NT Calcaneous: NT Metatarsals: NT 5th MT: NT Phalanges: NT Achilles: NT Plantar Fascia: NT Fat Pad: NT Peroneals: NT Post Tib: NT Great Toe: Mildly decreased motion Ant Drawer: neg Talar Tilt: Some pain ATFL: NT CFL: NT Deltoid: NT Str: 4+ /5 Other Special tests: none Sensation: intact   Radiology: DG Ankle Complete Right  Result Date: 10/24/2020 CLINICAL DATA:  Remote fracture and posttraumatic fracture. Right ankle. EXAM: RIGHT ANKLE - COMPLETE 3+ VIEW COMPARISON:  X-ray right ankle 06/18/2014. FINDINGS: There is no evidence of acute displaced fracture, dislocation, or joint effusion. Redemonstration of old healed bi malleolar fracture. Associated degenerative changes of the right ankle. No aggressive appearing focal bone abnormality. Soft tissues are unremarkable. Vascular calcifications. IMPRESSION: No acute displaced fracture or dislocation. Electronically Signed   By: Iven Finn M.D.   On: 10/24/2020 23:33     Assessment and Plan:     ICD-10-CM   1. Post-traumatic arthritis of ankle, right  M19.171 DG Ankle Complete Right  2. Ankle stiffness, right  M25.671    Total encounter time: 40 minutes. This includes total time spent on the day of encounter. Chart review, but we had extensive conversations about arthritis, posttraumatic arthritis, and ankle anatomy.  He  has extensive posttraumatic changes at the ankle.  He has fairly dramatic loss of motion. His strength is preserved and sensation is preserved. I think that his using an Aircast for years has not helped with his stiffness and is likely created additional loss of motion.  I am going to have the patient work on dedicated motion exercises daily, and titrate out of his Aircast at least partially when he is at sleep or resting at home.  I think he needs  to follow-up with me only on a as needed basis  I appreciate the opportunity to evaluate this very friendly patient. If you have any question regarding his care or prognosis, do not hesitate to ask.   Meds ordered this encounter  Medications  . naproxen (NAPROSYN) 500 MG tablet    Sig: Take 1 tablet (500 mg total) by mouth as needed for mild pain or moderate pain (Take 1 tablet twice daily as needed for your pain.).    Dispense:  60 tablet    Refill:  2   Medications Discontinued During This Encounter  Medication Reason  . naproxen (NAPROSYN) 500 MG tablet Reorder   Orders Placed This Encounter  Procedures  . DG Ankle Complete Right    Follow-up: No follow-ups on file.  Signed,  Maud Deed. Parul Porcelli, MD   Outpatient Encounter Medications as of 10/23/2020  Medication Sig  . aspirin-acetaminophen-caffeine (EXCEDRIN MIGRAINE) 951-884-16 MG per tablet Take 1 tablet by mouth 2 (two) times daily as needed.   Marland Kitchen atorvastatin (LIPITOR) 20 MG tablet Take 1 tablet (20 mg total) by mouth daily.  Marland Kitchen docusate sodium (DOK) 100 MG capsule Take 100 mg by mouth 2 (two) times daily.  Marland Kitchen dutasteride (AVODART) 0.5 MG capsule TAKE 1 CAPSULE BY MOUTH  EVERY OTHER DAY  . hydrochlorothiazide (MICROZIDE) 12.5 MG capsule Take 1 capsule (12.5 mg total) by mouth daily.  . naproxen (NAPROSYN) 500 MG tablet Take 1 tablet (500 mg total) by mouth as needed for mild pain or moderate pain (Take 1 tablet twice daily as needed for your pain.).  Marland Kitchen phenytoin (DILANTIN) 100 MG ER capsule Take 2 capsules (200mg ) in the morning and 3 (300mg ) at night  . Pyridoxine HCl (VITAMIN B-6 PO) Take 100 mg by mouth daily.   . ramipril (ALTACE) 10 MG capsule Take 1 capsule (10 mg total) by mouth daily.  Marland Kitchen terazosin (HYTRIN) 10 MG capsule Take 1 capsule (10 mg total) by mouth daily.  Marland Kitchen tiZANidine (ZANAFLEX) 2 MG tablet Take 1 tablet (2 mg total) by mouth 2 (two) times daily as needed for muscle spasms (sedation precautions).  .  [DISCONTINUED] naproxen (NAPROSYN) 500 MG tablet Take 1 tablet (500 mg total) by mouth as needed for mild pain, moderate pain or headache (Take 1 tablet twice daily as needed for your pain.).   No facility-administered encounter medications on file as of 10/23/2020.

## 2020-10-23 NOTE — Addendum Note (Signed)
Addended by: Ria Bush on: 10/23/2020 03:31 PM   Modules accepted: Orders

## 2020-10-25 ENCOUNTER — Encounter: Payer: Self-pay | Admitting: Family Medicine

## 2020-10-29 ENCOUNTER — Telehealth: Payer: Self-pay | Admitting: Family Medicine

## 2020-10-29 DIAGNOSIS — G6289 Other specified polyneuropathies: Secondary | ICD-10-CM

## 2020-10-29 DIAGNOSIS — R270 Ataxia, unspecified: Secondary | ICD-10-CM

## 2020-10-29 DIAGNOSIS — M4722 Other spondylosis with radiculopathy, cervical region: Secondary | ICD-10-CM

## 2020-10-29 DIAGNOSIS — D492 Neoplasm of unspecified behavior of bone, soft tissue, and skin: Secondary | ICD-10-CM

## 2020-10-29 DIAGNOSIS — M7918 Myalgia, other site: Secondary | ICD-10-CM

## 2020-10-29 DIAGNOSIS — M4712 Other spondylosis with myelopathy, cervical region: Secondary | ICD-10-CM

## 2020-10-29 NOTE — Telephone Encounter (Signed)
Called patient to schedule his MRI Cervical Spine and he is asking about another scan of his mid-lower back. He feels that his pain in his legs is coming from his lower back - he is requesting a scan of his lower back as well.  He wants to know if this is something that can be done as a whole - total back scan rather than two separate scans.   Pt decided to hold off on the MRI Cervical Spine at this time.   Please advise, thanks.

## 2020-10-30 NOTE — Telephone Encounter (Signed)
I am going to send to Dr. Darnell Level who placed these orders.  I saw him for some complex post-traumatic ankle pain for many years.  I barely touched on his spine issues.

## 2020-11-05 NOTE — Telephone Encounter (Signed)
Please advise Dr Darnell Level regarding change to order  Pt was scheduled 11/18/20 for the cervical spine MRI but this was cancelled bc the patient felt that he needed his "total" back scanned d/t neck AND lower back/leg pain

## 2020-11-07 ENCOUNTER — Other Ambulatory Visit: Payer: Self-pay | Admitting: Family Medicine

## 2020-11-08 NOTE — Telephone Encounter (Signed)
Please advise, thanks.

## 2020-11-12 NOTE — Telephone Encounter (Addendum)
There is not an MRI that looks at the entire spine, need to order separately.  I have reordered mri of cervical spine and will add mri of lumbar spine. Will see if insurance will approve both.

## 2020-11-12 NOTE — Addendum Note (Signed)
Addended by: Ria Bush on: 11/12/2020 06:08 PM   Modules accepted: Orders

## 2020-11-18 ENCOUNTER — Other Ambulatory Visit: Payer: Medicare Other

## 2020-11-28 ENCOUNTER — Ambulatory Visit
Admission: RE | Admit: 2020-11-28 | Discharge: 2020-11-28 | Disposition: A | Discharge status: Home / Self Care | Payer: Medicare Other | Source: Ambulatory Visit | Attending: Family Medicine | Admitting: Family Medicine

## 2020-11-28 ENCOUNTER — Ambulatory Visit: Payer: Medicare Other

## 2020-11-28 ENCOUNTER — Other Ambulatory Visit: Payer: Self-pay

## 2020-11-28 DIAGNOSIS — R270 Ataxia, unspecified: Secondary | ICD-10-CM | POA: Diagnosis present

## 2020-11-28 DIAGNOSIS — D492 Neoplasm of unspecified behavior of bone, soft tissue, and skin: Secondary | ICD-10-CM | POA: Diagnosis present

## 2020-11-28 DIAGNOSIS — G6289 Other specified polyneuropathies: Secondary | ICD-10-CM

## 2020-11-28 DIAGNOSIS — M7918 Myalgia, other site: Secondary | ICD-10-CM

## 2020-11-28 DIAGNOSIS — M4722 Other spondylosis with radiculopathy, cervical region: Secondary | ICD-10-CM | POA: Diagnosis present

## 2020-11-28 DIAGNOSIS — M4712 Other spondylosis with myelopathy, cervical region: Secondary | ICD-10-CM | POA: Diagnosis not present

## 2021-01-02 ENCOUNTER — Encounter: Payer: Self-pay | Admitting: Family Medicine

## 2021-01-02 ENCOUNTER — Telehealth: Payer: Self-pay | Admitting: Family Medicine

## 2021-01-02 DIAGNOSIS — M5136 Other intervertebral disc degeneration, lumbar region: Secondary | ICD-10-CM

## 2021-01-02 DIAGNOSIS — M4712 Other spondylosis with myelopathy, cervical region: Secondary | ICD-10-CM

## 2021-01-02 DIAGNOSIS — M7918 Myalgia, other site: Secondary | ICD-10-CM

## 2021-01-02 NOTE — Telephone Encounter (Signed)
Error

## 2021-01-16 MED ORDER — GABAPENTIN 100 MG PO CAPS
ORAL_CAPSULE | ORAL | 1 refills | Status: DC
Start: 2021-01-16 — End: 2021-02-26

## 2021-01-16 NOTE — Addendum Note (Signed)
Addended by: Ria Bush on: 01/16/2021 11:53 AM   Modules accepted: Orders

## 2021-01-27 ENCOUNTER — Other Ambulatory Visit: Payer: Self-pay

## 2021-01-27 ENCOUNTER — Ambulatory Visit (INDEPENDENT_AMBULATORY_CARE_PROVIDER_SITE_OTHER): Payer: Medicare Other | Admitting: Urology

## 2021-01-27 VITALS — BP 148/86 | HR 84

## 2021-01-27 DIAGNOSIS — R350 Frequency of micturition: Secondary | ICD-10-CM | POA: Diagnosis not present

## 2021-01-27 NOTE — Progress Notes (Signed)
01/27/2021 8:58 AM   Mark Holmes 01-Sep-1935 [id number]  Referring provider: Ria Bush, MD [address] [place],  [place] [address]  Chief Complaint  Patient presents with  . Benign Prostatic Hypertrophy    HPI: Patient saw Dr. Chauncey Cruel 1 year ago for voiding dysfunction and had a TURP in 2018.  A prior biopsy for elevated PSA was normal.  In July 2019 PSA was 0.8.  BSA was 4.552 years ago and in July 2016 was 0.51  His main complaint is slow flow and he presses down with his abdominal muscles to strain.  He gets up 3 times a night.  He has no ankle edema.  He voids infrequently during the day.  He is still on Avodart.  Clinically not infected  50 g benign prostate with hemorrhoid and mild anal stenosis   Patient will continue to get his PSA by primary care though we talked about its usefulness in his age group.  I will represcribe the Avodart.  There is really nothing else that we can do for his poor flow and unlocked in order urodynamics.  He said 2 prostate procedures over the years.  We talked about the use of an overactive bladder medication for nighttime frequency but it could theoretically worsen his flow  Avodart 90 tablets and 3 refills sent and see in 1 year.  Unfortunately the patient lost his dog Johnny in November  Today Frequency stable. Stable with good days and bad days no infection or blood 40 g benign prostate with some mild anal stenosis   PMH: Past Medical History:  Diagnosis Date  . Benign prostatic hypertrophy with elevated PSA    per Dr. Jacqlyn Larsen  . History of CT scan of brain 1992   normal  . Hyperglycemia 01/2006   89  . Hyperlipemia   . Hypertension   . Seizure disorder [facility])     Surgical History: Past Surgical History:  Procedure Laterality Date  . ANTERIOR CERVICAL DECOMP/DISCECTOMY FUSION  05/2018   for myelopathy -C3/4 ([facility])  . CARPAL TUNNEL RELEASE Right 06/2019   ([facility])  . CATARACT  EXTRACTION W/ INTRAOCULAR LENS IMPLANT Right 2012   [facility]  . History of EEG  1985   normal  . hospitalization  1981   observation for seizuare  . TONSILLECTOMY    . TRANSURETHRAL RESECTION OF PROSTATE  10/2005   [name]  . TRANSURETHRAL RESECTION OF PROSTATE  08/2017   (rpt) [facility]    Home Medications:  Allergies as of 01/27/2021      Reactions   Dilaudid [hydromorphone Hcl] Other (See Comments)   hallucinations      Medication List       Accurate as of January 27, 2021  8:58 AM. If you have any questions, ask your nurse or doctor.        STOP taking these medications   DOK 100 MG capsule Generic drug: docusate sodium Stopped by: Reece Packer, MD   tiZANidine 2 MG tablet Commonly known as: ZANAFLEX Stopped by: Reece Packer, MD     TAKE these medications   aspirin-acetaminophen-caffeine 250-250-65 MG tablet Commonly known as: EXCEDRIN MIGRAINE Take 1 tablet by mouth 2 (two) times daily as needed.   atorvastatin 20 MG tablet Commonly known as: LIPITOR Take 1 tablet (20 mg total) by mouth daily.   dutasteride 0.5 MG capsule Commonly known as: AVODART TAKE 1 CAPSULE BY MOUTH  EVERY OTHER DAY   gabapentin 100 MG  capsule Commonly known as: NEURONTIN Take 1 capsule (100 mg total) by mouth at bedtime for 2 days, THEN 2 capsules (200 mg total) at bedtime for 2 days, THEN 3 capsules (300 mg total) at bedtime. Start taking on: January 16, 2021   hydrochlorothiazide 12.5 MG capsule Commonly known as: MICROZIDE Take 1 capsule (12.5 mg total) by mouth daily.   naproxen 500 MG tablet Commonly known as: Naprosyn Take 1 tablet (500 mg total) by mouth as needed for mild pain or moderate pain (Take 1 tablet twice daily as needed for your pain.).   phenytoin 100 MG ER capsule Commonly known as: DILANTIN Take 2 capsules (200mg ) in the morning and 3 (300mg ) at night   ramipril 10 MG capsule Commonly known as: ALTACE Take 1 capsule (10 mg total) by mouth  daily.   terazosin 10 MG capsule Commonly known as: HYTRIN Take 1 capsule (10 mg total) by mouth daily.   VITAMIN B-6 PO Take 100 mg by mouth daily.       Allergies:  Allergies  Allergen Reactions  . Dilaudid [Hydromorphone Hcl] Other (See Comments)    hallucinations    Family History: Family History  Problem Relation Age of Onset  . Febrile seizures Sister   . Cancer Sister        lung/smoker  . CAD Neg Hx     Social History:  reports that he has never smoked. He has never used smokeless tobacco. He reports that he does not drink alcohol and does not use drugs.  ROS:                                        Physical Exam: BP (!) 148/86   Pulse 84   Constitutional:  Alert and oriented, No acute distress. HEENT: [place] AT, moist mucus membranes.  Trachea midline, no masses.   Laboratory Data: Lab Results  Component Value Date   WBC 7.9 07/11/2020   HGB 11.9 (L) 07/11/2020   HCT 33.0 (L) 07/11/2020   MCV 84.8 07/11/2020   PLT 245 07/11/2020    Lab Results  Component Value Date   CREATININE 1.02 07/11/2020    Lab Results  Component Value Date   PSA 0.62 07/04/2020   PSA 0.51 07/06/2019   PSA 0.81 07/01/2018    No results found for: TESTOSTERONE  No results found for: HGBA1C  Urinalysis    Component Value Date/Time   APPEARANCEUR Clear 01/17/2019 1320   GLUCOSEU Negative 01/17/2019 1320   BILIRUBINUR Negative 01/17/2019 1320   PROTEINUR Negative 01/17/2019 1320   NITRITE Negative 01/17/2019 1320   LEUKOCYTESUR Negative 01/17/2019 1320    Pertinent Imaging:   Assessment & Plan: 90x3 finasteride sent to pharmacy and I will see in 1 year  There are no diagnoses linked to this encounter.  No follow-ups on file.  Reece Packer, MD  [place] [place] [address] [place], [place] [address] [phone]

## 2021-02-18 ENCOUNTER — Encounter: Payer: Self-pay | Admitting: Family Medicine

## 2021-02-19 ENCOUNTER — Ambulatory Visit: Payer: Medicare Other | Admitting: Family Medicine

## 2021-02-26 ENCOUNTER — Ambulatory Visit: Payer: Medicare Other | Admitting: Family Medicine

## 2021-02-26 ENCOUNTER — Encounter: Payer: Self-pay | Admitting: Family Medicine

## 2021-02-26 ENCOUNTER — Other Ambulatory Visit: Payer: Self-pay

## 2021-02-26 VITALS — BP 122/60 | HR 69 | Temp 97.8°F | Ht 67.0 in | Wt 181.4 lb

## 2021-02-26 DIAGNOSIS — R2681 Unsteadiness on feet: Secondary | ICD-10-CM

## 2021-02-26 DIAGNOSIS — M4712 Other spondylosis with myelopathy, cervical region: Secondary | ICD-10-CM

## 2021-02-26 DIAGNOSIS — M4722 Other spondylosis with radiculopathy, cervical region: Secondary | ICD-10-CM | POA: Diagnosis not present

## 2021-02-26 DIAGNOSIS — H6123 Impacted cerumen, bilateral: Secondary | ICD-10-CM | POA: Diagnosis not present

## 2021-02-26 MED ORDER — GABAPENTIN 100 MG PO CAPS: ORAL_CAPSULE | ORAL | 3 refills | Status: DC

## 2021-02-26 NOTE — Patient Instructions (Addendum)
Keep upcoming appointments for nerve study, physical therapy, and follow up with PM&R office.  Continue gabapentin 100/100/300mg  daily.  Good to see you today.

## 2021-02-26 NOTE — Progress Notes (Addendum)
Patient ID: Mark Holmes, male    DOB: February 04, 1935, 86 y.o.   MRN: [id number]  This visit was conducted in person.  BP 122/60   Pulse 69   Temp 97.8 F (36.6 C) (Temporal)   Ht 5\' 7"  (1.702 m)   Wt 181 lb 6 oz (82.3 kg)   SpO2 95%   BMI 28.41 kg/m    CC: 4 mo f/u visit  Subjective:   HPI: Mark Holmes is a 86 y.o. male presenting on 02/26/2021 for Follow-up (Here for 4 mo f/u.)   See prior note for details.  Saw PM&R pending BLE NCS and balance training PT for periph neuropathy leading to unsteadiness. Note reviewed. Planning to r/o lumbar radiculitis.  Was on gabapentin 300mg  nightly. Was recommended to increase dosing. He's been taking gabapentin dose to 100/100/300.  New puppy at home - he's been walking more regularly.   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  S/p R CTS release surgery 06/21/2019 - with persistent hand paresthesias.   By the way - noted hearing loss to R ear when he woke up this morning. Exam with cerumen impaction bilaterally.      Relevant past medical, surgical, family and social history reviewed and updated as indicated. Interim medical history since our last visit reviewed. Allergies and medications reviewed and updated. Outpatient Medications Prior to Visit  Medication Sig Dispense Refill  . aspirin-acetaminophen-caffeine (EXCEDRIN MIGRAINE) 250-250-65 MG per tablet Take 1 tablet by mouth 2 (two) times daily as needed.     Marland Kitchen atorvastatin (LIPITOR) 20 MG tablet Take 1 tablet (20 mg total) by mouth daily. 90 tablet 3  . dutasteride (AVODART) 0.5 MG capsule TAKE 1 CAPSULE BY MOUTH  EVERY OTHER DAY 45 capsule 3  . hydrochlorothiazide (MICROZIDE) 12.5 MG capsule Take 1 capsule (12.5 mg total) by mouth daily. 90 capsule 3  . naproxen (NAPROSYN) 500 MG tablet Take 1 tablet (500 mg total) by mouth as needed for mild pain or moderate pain (Take 1 tablet twice daily as needed  for your pain.). 60 tablet 2  . phenytoin (DILANTIN) 100 MG ER capsule Take 2 capsules (200mg ) in the morning and 3 (300mg ) at night 450 capsule 3  . Pyridoxine HCl (VITAMIN B-6 PO) Take 100 mg by mouth daily.     . ramipril (ALTACE) 10 MG capsule Take 1 capsule (10 mg total) by mouth daily. 90 capsule 3  . terazosin (HYTRIN) 10 MG capsule Take 1 capsule (10 mg total) by mouth daily. 90 capsule 3  . gabapentin (NEURONTIN) 100 MG capsule Take 1 capsule (100 mg total) by mouth at bedtime for 2 days, THEN 2 capsules (200 mg total) at bedtime for 2 days, THEN 3 capsules (300 mg total) at bedtime. 90 capsule 1   No facility-administered medications prior to visit.     Per HPI unless specifically indicated in ROS section below Review of Systems Objective:  BP 122/60   Pulse 69   Temp 97.8 F (36.6 C) (Temporal)   Ht 5\' 7"  (1.702 m)   Wt 181 lb 6 oz (82.3 kg)   SpO2 95%   BMI 28.41 kg/m   Wt Readings from Last 3 Encounters:  02/26/21 181 lb 6 oz (82.3 kg)  10/23/20 178 lb 4 oz (80.9 kg)  10/22/20 176 lb 8 oz (80.1 kg)      Physical Exam Vitals and nursing note reviewed.  Constitutional:  Appearance: Normal appearance. He is not ill-appearing.  HENT:     Right Ear: Decreased hearing noted. There is impacted cerumen.     Left Ear: Decreased hearing noted. There is impacted cerumen.     Ears:     Comments:  Cerumen impaction bilaterally Cerumen irrigation performed, pt tolerated well. Pearly grey TM visualized on left  Neurological:     Mental Status: He is alert.  Psychiatric:        Mood and Affect: Mood normal.        Behavior: Behavior normal.       Assessment & Plan:  This visit occurred during the SARS-CoV-2 public health emergency.  Safety protocols were in place, including screening questions prior to the visit, additional usage of staff PPE, and extensive cleaning of exam room while observing appropriate contact time as indicated for disinfecting solutions.    Problem List Items Addressed This Visit    Cervical spondylosis with myelopathy and radiculopathy   Relevant Medications   gabapentin (NEURONTIN) 100 MG capsule   Unsteadiness on feet - Primary    Likely periph neuropathy related, r/o lumbar radiculitis. Pending BLE nerve conduction studies and physical therapy balance training course. He continues gabapentin 100/100/300mg  daily. Appreciate PM&R care.       Hearing loss due to cerumen impaction    S/p bilateral irrigation with benefit.           Meds ordered this encounter  Medications  . gabapentin (NEURONTIN) 100 MG capsule    Sig: Take 1 capsule (100 mg total) by mouth 2 (two) times daily AND 3 capsules (300 mg total) at bedtime.    Dispense:  450 capsule    Refill:  3   No orders of the defined types were placed in this encounter.   Patient Instructions  Keep upcoming appointments for nerve study, physical therapy, and follow up with PM&R office.  Continue gabapentin 100/100/300mg  daily.  Good to see you today.    Follow up plan: Return if symptoms worsen or fail to improve.  Ria Bush, MD

## 2021-03-01 DIAGNOSIS — H6120 Impacted cerumen, unspecified ear: Secondary | ICD-10-CM | POA: Insufficient documentation

## 2021-03-01 NOTE — Assessment & Plan Note (Signed)
S/p bilateral irrigation with benefit.

## 2021-03-01 NOTE — Assessment & Plan Note (Addendum)
Likely periph neuropathy related, r/o lumbar radiculitis. Pending BLE nerve conduction studies and physical therapy balance training course. He continues gabapentin 100/100/300mg  daily. Appreciate PM&R care.

## 2021-03-13 ENCOUNTER — Ambulatory Visit: Payer: Medicare Other | Attending: Family Medicine | Admitting: Physical Therapy

## 2021-03-13 ENCOUNTER — Other Ambulatory Visit: Payer: Self-pay

## 2021-03-13 ENCOUNTER — Encounter: Payer: Self-pay | Admitting: Physical Therapy

## 2021-03-13 DIAGNOSIS — Z9181 History of falling: Secondary | ICD-10-CM | POA: Insufficient documentation

## 2021-03-13 DIAGNOSIS — M545 Low back pain, unspecified: Secondary | ICD-10-CM | POA: Insufficient documentation

## 2021-03-13 DIAGNOSIS — R262 Difficulty in walking, not elsewhere classified: Secondary | ICD-10-CM | POA: Diagnosis present

## 2021-03-13 DIAGNOSIS — G8929 Other chronic pain: Secondary | ICD-10-CM | POA: Insufficient documentation

## 2021-03-13 DIAGNOSIS — R2681 Unsteadiness on feet: Secondary | ICD-10-CM | POA: Insufficient documentation

## 2021-03-13 NOTE — Therapy (Signed)
[facility] [address] [place], [address] Phone: [phone]   Fax:  [phone]  Physical Therapy Evaluation  Patient Details  Name: Mark Holmes MRN: [id number] Date of Birth: 08-27-1935 Referring Provider (PT): Allene Dillon, NP   Encounter Date: 03/13/2021   PT End of Session - 03/13/21 2052    Visit Number 1    Number of Visits 24    Date for PT Re-Evaluation 06/05/21    Authorization Type [employer] MEDICARE    Progress Note Due on Visit 10    PT Start Time 0900    PT Stop Time 0945    PT Time Calculation (min) 45 min    Activity Tolerance Patient tolerated treatment well    Behavior During Therapy [facility] for tasks assessed/performed   verbose          Past Medical History:  Diagnosis Date  . Benign prostatic hypertrophy with elevated PSA    per Dr. Jacqlyn Larsen  . History of CT scan of brain 1992   normal  . Hyperglycemia 01/2006   102  . Hyperlipemia   . Hypertension   . Seizure disorder [facility])     Past Surgical History:  Procedure Laterality Date  . ANTERIOR CERVICAL DECOMP/DISCECTOMY FUSION  05/2018   for myelopathy -C3/4 ([facility])  . CARPAL TUNNEL RELEASE Right 06/2019   ([facility])  . CATARACT EXTRACTION W/ INTRAOCULAR LENS IMPLANT Right 2012   [facility]  . History of EEG  1985   normal  . hospitalization  1981   observation for seizuare  . TONSILLECTOMY    . TRANSURETHRAL RESECTION OF PROSTATE  10/2005   [name]  . TRANSURETHRAL RESECTION OF PROSTATE  08/2017   (rpt) [facility]    There were no vitals filed for this visit.    Subjective Assessment - 03/13/21 0924    Subjective Patient states he has pain across his lower back when he is standing (for example shaving). Another example is he was standing in the hallway looking at the thermostat and his right ankle just "gave out" and he went down (first time for a long time). He reports that his legs feel numb from the  knees down when he is sitting and by late afternoon his feel like they are "going to bust out of my shoes." He also feels like his walking is off and he would like to walk "like normal." States his neurologist said he was a "complicated mess."  He has been told to use a cane, which he does, but he can walk some without it but runs into things sometimes. He stated it all started in his left thumb feeling numb in 01/2018. His wife went to a doctor for "rhumatitis." Patient spoke to a doctor about the numbness he had int he thumb They did a radiograph and MRI of neck and it was "all messed up." An ACDF at C3-4 was performed (05/2018) to keep him out of a wheelchair. He still has some difficulty with his arms but he feels like he can deal with this although he has difficulty tying his shoes due to loss of fine motor skills. He now is more concerned about his legs that hurt and feel numb over his lower legs. This has been going on for over a year. He is to do PT and then nerve conduction study for his legs. He is unsure if his legs are getting better, worse, or staying the same.  He has been talking gabapentin (improves his sleeping at night). States his feet feel hot at night. Has a history of R ankle injury (fracture) when he came back from [place]. R LE seems to have started bothering him before his L LE and he feels it may be a bit weaker and more affected. He first thought his low back pain was causing problems with the R foot, but when it started on the left side he felt that it was the same pain he had in the R. He does have a history of sciatica on the R side a long time ago. He wears a brace on the R ankle for several years because it is painful if he walks on it without that. Back pain is just pain across lower back. When he is sitting at a PC for an hour or two he can feel both of his lower legs numb when he goes to get up. In 1982 he had 2 seizures and has been on dilantin since then and he understands this  medication can cause problems with peripheral neuropathy.    Pertinent History Patient is a 86 y.o. male who presents to outpatient physical therapy with a referral for medical diagnosis difficulty balancing, lumbar DDD (degenerative disc disease) with request for balance therapy. This patient's chief complaints consist of discomfort of the B LE below the knee, pain in the low back with standing, and difficulty balancing in the setting of chronic R ankle pain, B hand neuropathy symptoms and history of cervical spine disorder with ACDF at C3-4 for myelopathy leading to the following functional deficits: cannot do what he wants to outside because he is afraid to fall, taking the puppy out, prolonged standing, household chores, community ambulation, grooming, climbing stairs, yard work, sleeping, fall risk, etc. .  Relevant past medical history and comorbidities include seizure disorder (controlled with meds), hx R ankle fracture (wears an air brace to prevent pain for years), ACDF (05/2018 for myelopathy), R  Carpal tunnel release (06/2019), BPH, symptoms of peripheral neuropathy at all limbs (fine motor skills in B UE affected), CTS, perfuse degenerative changes on lumbar and cervical spine imaging (see chart for details).    Limitations Standing;Walking;Sitting;Other (comment);House hold activities   Functional Limitations: cannot do what he wants to outside because he is afraid to fall, taking the puppy out, prolonged standing, household chores, community ambulation, grooming, climbing stairs, yard work, sleeping.   How long can you sit comfortably? "a long time' (but will feelit when he stands up)    How long can you stand comfortably? can finish the task but gets really uncomfortable in the back    Diagnostic tests Cervical spine MRI report 11/29/2020: " IMPRESSION:  1. Neurofibroma of the left C2 nerve as seen previously, maximal  transverse diameter 11-12 mm.  2. Previous ACDF C3-4. Canal stenosis remains at  this level with AP  diameter of 7 mm, without worsening. No actual cord compression.  3. Spondylosis and facet arthropathy with degenerative  anterolisthesis of C5-6 and C7-T1. Canal narrowing but no cord  compression.  4. Foraminal stenosis primarily due to facet osteophytes and  uncovertebral osteophytes that could cause neural compression  bilateral at C2-3, bilateral at C3-4, bilateral at C4-5, bilateral  but left more than right at C5-6, bilateral but left more than right  at C6-7 and bilateral but left more than right at C7-T1." Lumbar spine MRI report 11/29/2020: "IMPRESSION:  1. Degenerative disc disease and degenerative facet disease  throughout  the lumbar region which could be associated with back  pain. Thoracolumbar scoliotic curvature.  2. L2-3: Moderate central canal stenosis. Bilateral lateral recess  and foraminal stenosis left worse than right. Neural compression  could occur at this level, particularly on the left.  3. L3-4: Bilateral lateral recess and foraminal stenosis, right  worse than left. Neural compression could occur on either side,  particularly on the right.  4. L4-5: Bilateral lateral recess and foraminal stenosis, right more  than left. Neural compression could occur on either side,  particularly the right.  5. L5-S1: Bilateral foraminal narrowing with some potential to  affect either exiting L5 nerve."    Patient Stated Goals he would like to walk "like normal."    Currently in Pain? No/denies    Pain Score 0-No pain   sitting in the morning; W: without brace at R ankle 7-8/10, 6-7/10 neuro-type pain, low back 5-6/10   Pain Location Leg   across low back when standing (not down legs), or in legs after prolonged sitting and in afternoon, R ankle (without brace)   Pain Orientation Right;Left    Pain Descriptors / Indicators Other (Comment)   numb, hot, like knees will give out, feels like feet will pop out of shoes.   Pain Radiating Towards has pain/paresthesia in B feet and  hands in apparent stocking/glove pattern. Unclear if there is radiation from the low back at any time.    Pain Onset More than a month ago    Pain Frequency Intermittent    Aggravating Factors  standing up after prolonged sitting, afternoons,(leg problem); standing up (low back)    Pain Relieving Factors back (sitting down), legs (contatct with cool sheets).    Effect of Pain on Daily Activities Functional Limitations: difficulty with activities that require standing balance including outside activities, taking the puppy out, prolonged standing, household chores, community ambulation, grooming, climbing stairs, yard work, sleeping, fall risk, etc. He states he cannot do what he wants to outside because he is afraid to fall. He can do what he wants down on his knees, but not standing up. He has a new puppy and it is hard to keep his balance when she is pulling him.              [facility] PT Assessment - 03/13/21 1422      Assessment   Medical Diagnosis difficulty balancing, lumbar DDD (degenerative disc disease), request for balance therapy    Referring Provider (PT) Allene Dillon, NP    Onset Date/Surgical Date --   sometime since feb 2019   Prior Therapy reports a few visits in the past for low back pain      Precautions   Precautions None    Required Braces or Orthoses --   wears an air splint on R LE for last several years to decrease pain.     Restrictions   Weight Bearing Restrictions No      Balance Screen   Has the patient fallen in the past 6 months Yes    How many times? 2    Has the patient had a decrease in activity level because of a fear of falling?  Yes    Is the patient reluctant to leave their home because of a fear of falling?  No      Home Environment   Living Environment --   no concerns about getting around home safely     Prior Function   Level of Independence Independent  used to do yard work, work on his home, Social research officer, government.   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 Posture: mild forward flexion at the waist, B genuvarum.  . Tremor: none . Muscle bulk: no gross abnormalities or asymmetries.   . Bed mobility: deferred . Transfers: sit <> stand mod I with mild unsteadiness.  . Gait: Ambulates with SPC and mildly ataxic and antalgic gait favoring L LE, wide stance, staggering occasional with increased frontal plane movement, mildly forward flexed.    NEUROLOGICAL UMN screen Babinski and clonus negative bilaterally Sensation . Sensation at bilateral feet decreased to light touch and pin prick with some regions of both feet inconsistently able to locate position or touch/prick with eyes closed.  Myotomes . L2-S2 appears intact Neurodynamic Tests   . Slump negative bilaterally   SPINE MOTION Lumbar AROM *Indicates pain - Flexion: = fingers to toes, no pain - Extension: = 50% pain in back.   PERIPHERAL JOINT MOTION (in degrees) B LE generally WFL for basic mobility with likely limitations in hip extension. Specific testing deferred to later date as needed.  MUSCLE PERFORMANCE (MMT):  *Indicates pain 03/13/2021 Date Date  Joint/Motion R/L R/L R/L  Shoulder     Hip     Flexion 4+/4+ / /  Extension (knee ext) / / /  Abduction / / /  Knee     Extension 5/5 / /  Flexion 4+/5 / /  Ankle/Foot     Dorsiflexion  4+/5 / /  Great toe extension 4+/4+ / /  Eversion 4+/4+ / /  Plantarflexion 4+/4+ / /   SPECIAL TESTS: Slump negative bilaterally  EDUCATION/COGNITION: Patient is alert and oriented X 4.  Objective measurements completed on examination: See above findings.      PT Education - 03/13/21 2052    Education Details exam purpose/form. Education on diagnosis, prognosis, POC, anatomy and physiology of current condition    Person(s)  Educated Patient    Methods Explanation;Demonstration;Tactile cues;Verbal cues    Comprehension Verbalized understanding;Returned demonstration;Verbal cues required;Tactile cues required;Need further instruction            PT Short Term Goals - 03/13/21 2053      PT SHORT TERM GOAL #1   Title Be independent with initial home exercise program for self-management of symptoms.    Baseline Initial HEP to be established at visit 2 as appropriate (03/13/2021);    Time 3    Period Weeks    Status New    Target Date 04/03/21             PT Long Term Goals - 03/13/21 2054      PT LONG TERM GOAL #1   Title Be independent with a long-term home exercise program for self-management of symptoms.    Baseline Initial HEP to be established at visit 2 as appropriate (03/13/2021);    Time 12    Period Weeks    Status New   TARGET DATE FOR ALL LONG TERM GOALS: 06/05/2021     PT LONG TERM GOAL #2   Title Demonstrate improved FOTO score by 10 units to demonstrate improvement in overall condition and self-reported functional ability.    Baseline to be measured vsiti 2 as appropriate (03/13/2021);  Time 12    Period Weeks    Status New    Target Date 06/05/21      PT LONG TERM GOAL #3   Title Patient will score equal or greater than 25/30 on Functional Gait Assessment to demonstrate reduction in fall risk to low fall risk classification.    Baseline to be measured vsiti 2 as appropriate (03/13/2021);    Time 12    Period Weeks    Status New      PT LONG TERM GOAL #4   Title Patient will ambulate equal or greater than 1000 feet during 6 minute walk test to demonstrate improved community mobility.    Baseline to be measured vsiti 2 as appropriate (03/13/2021);    Time 12    Period Weeks    Status New      PT LONG TERM GOAL #5   Title Complete community, work and/or recreational activities with 50% less limitation due to current condition.    Baseline Functional Limitations: difficulty with  activities that require standing balance including outside activities, taking the puppy out, prolonged standing, household chores, community ambulation, grooming, climbing stairs, yard work, sleeping, fall risk, etc. cannot do what he wants to outside because he is afraid to fall (03/13/2021);    Time 12    Period Weeks    Status New                  Plan - 03/13/21 2111    Clinical Impression Statement Patient is a 86 y.o. male referred to outpatient physical therapy with a medical diagnosis of difficulty balancing, lumbar DDD (degenerative disc disease) with request for balance therapy who presents with signs and symptoms consistent with difficulty with balance, high fall risk, low back pain, peripheral neuropathy, difficulty walking in the setting of history of myelopathy, chronic R ankle pain/dysfunction, and possible radicular symptoms from the low back affecting B LE. Patient with complex presentation requiring further testing at subsequent visits to help differentiate whether lumbar or cervical dysfunction is contributing to B LE discomfort and imbalance. Currently B LE discomfort is most consistent with peripheral neuropathy. Patient presents with significant pain, disturbance of sensation, motor control, balance, gait, ROM, muscle tension, joint stiffness, muscle performance (strength/power/endurance) and activity tolerance impairments that are limiting ability to complete his usual activities including activities that require standing balance including outside activities, taking the puppy out, prolonged standing, household chores, community ambulation, grooming, climbing stairs, yard work, sleeping, fall risk, etc, without difficulty. He cannot do what he wants to outside because he is afraid to fall. Patient will benefit from trial of skilled physical therapy intervention to address current body structure impairments and activity limitations to improve function and work towards goals set in  current POC in order to return to prior level of function or maximal functional improvement.    Personal Factors and Comorbidities Age;Comorbidity 3+;Past/Current Experience;Fitness;Time since onset of injury/illness/exacerbation    Comorbidities Relevant past medical history and comorbidities include seizure disorder (controlled with meds), hx R ankle fracture (wears an air brace to prevent pain for years), ACDF (05/2018 for myelopathy), R  Carpal tunnel release (06/2019), BPH, symptoms of peripheral neuropathy at all limbs (fine motor skills in B UE affected), CTS, perfuse degenerative changes on lumbar and cervical spine imaging (see chart for details).    Examination-Activity Limitations Bend;Locomotion Level;Stand;Carry;Sleep;Hygiene/Grooming;Caring for Others;Lift;Stairs;Squat    Examination-Participation Restrictions Shop;Community Activity;Meal Prep;Valla Leaver Work   activities that require standing balance including outside activities, taking the puppy  out, prolonged standing, household chores, community ambulation, grooming, climbing stairs, yard work, sleeping, fall risk, etc.   Stability/Clinical Decision Making Evolving/Moderate complexity    Clinical Decision Making Moderate    Rehab Potential Fair    PT Frequency 2x / week    PT Duration 12 weeks    PT Treatment/Interventions ADLs/Self Care Home Management;Cryotherapy;Traction;Moist Heat;Electrical Stimulation;DME Instruction;Gait training;Stair training;Functional mobility training;Therapeutic activities;Therapeutic exercise;Balance training;Neuromuscular re-education;Patient/family education;Passive range of motion;Dry needling;Manual techniques;Joint Manipulations;Spinal Manipulations    PT Next Visit Plan continue to assess balance    PT Home Exercise Plan TBD    Consulted and Agree with Plan of Care Patient           Patient will benefit from skilled therapeutic intervention in order to improve the following deficits and impairments:   Abnormal gait,Decreased knowledge of use of DME,Impaired sensation,Improper body mechanics,Pain,Decreased coordination,Decreased mobility,Postural dysfunction,Decreased activity tolerance,Decreased endurance,Decreased range of motion,Decreased strength,Hypomobility,Impaired perceived functional ability,Impaired UE functional use,Decreased balance,Difficulty walking,Impaired flexibility  Visit Diagnosis: Unsteadiness on feet  Chronic bilateral low back pain, unspecified whether sciatica present  Difficulty in walking, not elsewhere classified  History of falling     Problem List Patient Active Problem List   Diagnosis Date Noted  . Hearing loss due to cerumen impaction 03/01/2021  . Atypical chest pain 07/10/2020  . 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 Unsteadiness on feet 11/08/2019  . History of spinal surgery 01/17/2019  . Neck pain 01/17/2019  . Weakness of hand 01/17/2019  . Benign prostatic hyperplasia with urinary obstruction 01/17/2019  . Systolic murmur 56/38/7564  . Right carpal tunnel syndrome 02/02/2018  . Ulnar neuropathy at elbow, left 02/02/2018  . Peripheral neuropathy 01/26/2018  . Bilateral buttock pain 06/29/2016  . Failed vision screen 06/29/2016  . Advanced care planning/counseling discussion 06/21/2015  . Medicare annual wellness visit, subsequent 06/18/2014  . Health maintenance examination 06/18/2014  . Chronic ankle pain 06/18/2014  . Incomplete emptying of bladder 06/07/2013  . Vitamin D deficiency 05/20/2013  . THYROID NODULE 05/17/2007  . HLD (hyperlipidemia) 05/17/2007  . TREMOR, ESSENTIAL 05/17/2007  . Essential hypertension 05/17/2007  . Cervical spondylosis with myelopathy and radiculopathy 05/17/2007  . Seizure disorder ([place]) 05/17/2007    Everlean Alstrom. Graylon Good, PT, DPT 03/13/21, 9:14 PM  [facility] [address] [place], [address] Phone: [phone]   Fax:   [phone]  Name: Mark Holmes MRN: [id number] Date of Birth: 03/21/1935

## 2021-03-18 ENCOUNTER — Other Ambulatory Visit: Payer: Self-pay

## 2021-03-18 ENCOUNTER — Encounter: Payer: Self-pay | Admitting: Physical Therapy

## 2021-03-18 ENCOUNTER — Ambulatory Visit: Payer: Medicare Other | Admitting: Physical Therapy

## 2021-03-18 DIAGNOSIS — R2681 Unsteadiness on feet: Secondary | ICD-10-CM

## 2021-03-18 DIAGNOSIS — G8929 Other chronic pain: Secondary | ICD-10-CM

## 2021-03-18 DIAGNOSIS — M545 Low back pain, unspecified: Secondary | ICD-10-CM

## 2021-03-18 DIAGNOSIS — Z9181 History of falling: Secondary | ICD-10-CM

## 2021-03-18 DIAGNOSIS — R262 Difficulty in walking, not elsewhere classified: Secondary | ICD-10-CM

## 2021-03-18 NOTE — Therapy (Signed)
[facility] [address] [place], [address] Phone: [phone]   Fax:  [phone]  Physical Therapy Treatment  Patient Details  Name: Mark Holmes MRN: [id number] Date of Birth: [birth year] Referring Provider (PT): Allene Dillon, NP   Encounter Date: 03/18/2021   PT End of Session - 03/18/21 1127    Visit Number 2    Number of Visits 24    Date for PT Re-Evaluation 06/05/21    Authorization Type [employer] MEDICARE    Progress Note Due on Visit 10    PT Start Time 1122    PT Stop Time 1204    PT Time Calculation (min) 42 min    Equipment Utilized During Treatment Gait belt    Activity Tolerance Patient tolerated treatment well    Behavior During Therapy WFL for tasks assessed/performed   verbose          Past Medical History:  Diagnosis Date  . Benign prostatic hypertrophy with elevated PSA    per Dr. Jacqlyn Larsen  . History of CT scan of brain 1992   normal  . Hyperglycemia 01/2006   102  . Hyperlipemia   . Hypertension   . Seizure disorder [facility])     Past Surgical History:  Procedure Laterality Date  . ANTERIOR CERVICAL DECOMP/DISCECTOMY FUSION  05/2018   for myelopathy -C3/4 ([facility])  . CARPAL TUNNEL RELEASE Right 06/2019   ([facility])  . CATARACT EXTRACTION W/ INTRAOCULAR LENS IMPLANT Right 2012   [facility]  . History of EEG  1985   normal  . hospitalization  1981   observation for seizuare  . TONSILLECTOMY    . TRANSURETHRAL RESECTION OF PROSTATE  10/2005   [name]  . TRANSURETHRAL RESECTION OF PROSTATE  08/2017   (rpt) [facility]    There were no vitals filed for this visit.   Subjective Assessment - 03/18/21 1123    Subjective Patient reports he is "trying to nurse a cold." He states he does not have any pain currently. He states he just notices the pain across his lower back and paresthesia in the lower legs. He fell over the dog but states he wasn't injured. "I know how  to fall"    Pertinent History Patient is a 86 y.o. male who presents to outpatient physical therapy with a referral for medical diagnosis difficulty balancing, lumbar DDD (degenerative disc disease) with request for balance therapy. This patient's chief complaints consist of discomfort of the B LE below the knee, pain in the low back with standing, and difficulty balancing in the setting of chronic R ankle pain, B hand neuropathy symptoms and history of cervical spine disorder with ACDF at C3-4 for myelopathy leading to the following functional deficits: cannot do what he wants to outside because he is afraid to fall, taking the puppy out, prolonged standing, household chores, community ambulation, grooming, climbing stairs, yard work, sleeping, fall risk, etc. .  Relevant past medical history and comorbidities include seizure disorder (controlled with meds), hx R ankle fracture (wears an air brace to prevent pain for years), ACDF (05/2018 for myelopathy), R  Carpal tunnel release (06/2019), BPH, symptoms of peripheral neuropathy at all limbs (fine motor skills in B UE affected), CTS, perfuse degenerative changes on lumbar and cervical spine imaging (see chart for details).    Limitations Standing;Walking;Sitting;Other (comment);House hold activities   Functional Limitations: cannot do what he wants to outside because he is afraid to fall, taking the  puppy out, prolonged standing, household chores, community ambulation, grooming, climbing stairs, yard work, sleeping.   How long can you sit comfortably? "a long time' (but will feelit when he stands up)    How long can you stand comfortably? can finish the task but gets really uncomfortable in the back    Diagnostic tests Cervical spine MRI report 11/29/2020: " IMPRESSION:  1. Neurofibroma of the left C2 nerve as seen previously, maximal  transverse diameter 11-12 mm.  2. Previous ACDF C3-4. Canal stenosis remains at this level with AP  diameter of 7 mm, without  worsening. No actual cord compression.  3. Spondylosis and facet arthropathy with degenerative  anterolisthesis of C5-6 and C7-T1. Canal narrowing but no cord  compression.  4. Foraminal stenosis primarily due to facet osteophytes and  uncovertebral osteophytes that could cause neural compression  bilateral at C2-3, bilateral at C3-4, bilateral at C4-5, bilateral  but left more than right at C5-6, bilateral but left more than right  at C6-7 and bilateral but left more than right at C7-T1." Lumbar spine MRI report 11/29/2020: "IMPRESSION:  1. Degenerative disc disease and degenerative facet disease  throughout the lumbar region which could be associated with back  pain. Thoracolumbar scoliotic curvature.  2. L2-3: Moderate central canal stenosis. Bilateral lateral recess  and foraminal stenosis left worse than right. Neural compression  could occur at this level, particularly on the left.  3. L3-4: Bilateral lateral recess and foraminal stenosis, right  worse than left. Neural compression could occur on either side,  particularly on the right.  4. L4-5: Bilateral lateral recess and foraminal stenosis, right more  than left. Neural compression could occur on either side,  particularly the right.  5. L5-S1: Bilateral foraminal narrowing with some potential to  affect either exiting L5 nerve."    Patient Stated Goals he would like to walk "like normal."    Currently in Pain? No/denies    Pain Onset More than a month ago             [facility] PT Assessment - 03/18/21 0001      Assessment   Medical Diagnosis difficulty balancing, lumbar DDD (degenerative disc disease), request for balance therapy    Referring Provider (PT) Allene Dillon, NP    Onset Date/Surgical Date --   sometime since feb 2019   Prior Therapy reports a few visits in the past for low back pain      Precautions   Precautions None    Required Braces or Orthoses --   wears an air splint on R LE for last several years to decrease pain.      Restrictions   Weight Bearing Restrictions No      Home Environment   Living Environment --   no concerns about getting around home safely     Prior Function   Level of Independence Independent   used to do yard work, work on his home, Social research officer, government.   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   6.2 seconds  Change in Gait Speed Able to change speed, demonstrates mild gait deviations, deviates 6-10 in outside of the 12 in walkway width, or no gait deviations, unable to achieve a major change in velocity, or uses a change in velocity, or uses an assistive device.    Gait with Horizontal Head Turns Performs head turns smoothly with slight change in gait velocity (eg, minor disruption to smooth gait path), deviates 6-10 in outside 12 in walkway width, or uses an assistive device.    Gait with Vertical Head Turns Performs task with slight change in gait velocity (eg, minor disruption to smooth gait path), deviates 6 - 10 in outside 12 in walkway width or uses assistive device    Gait and Pivot Turn Pivot turns safely within 3 sec and stops quickly with no loss of balance.    Step Over Obstacle Is able to step over 2 stacked shoe boxes taped together (9 in total height) without changing gait speed. No evidence of imbalance.    Gait with Narrow Base of Support Ambulates less than 4 steps heel to toe or cannot perform without assistance.    Gait with Eyes Closed Walks 20 ft, uses assistive device, slower speed, mild gait deviations, deviates 6-10 in outside 12 in walkway width. Ambulates 20 ft in less than 9 sec but greater  than 7 sec.    Ambulating Backwards Walks 20 ft, uses assistive device, slower speed, mild gait deviations, deviates 6-10 in outside 12 in walkway width.    Steps Alternating feet, must use rail.   carries SPC with him but does not use for most activites   Total Score 20    FGA comment: 19-24 = medium risk fall          OBJECTIVE  FOTO = 51  FUNCTIONAL BALANCE TESTS  6 Minute Walk Test: 820 feet with SPC in R UE and SBA for safety. Antalgic gait favoring L LE and mildy ataxic overall. Developed pain in the low back, bilateral calves, thought L ankle would give out first. Back pain resolved with sitting.   Functional Gait Assessment: 20/30 medium fall risk (see above)  10MWT = 0.97 m/s  TREATMENT:    Therapeutic exercise: to centralize symptoms and improve ROM, strength, muscular endurance, and activity tolerance required for successful completion of functional activities.  - ambulation around clinic for distance in 6 minutes to assess baseline 6MWT (see above), practice ambulation, improve activity tolerance, and improve balance.  - Education on HEP including handout   Neuromuscular Re-education: to improve, balance, postural strength, muscle activation patterns, and stabilization strength required for functional activities: - completed Functional Gait Assessment to get baseline score (see above).  - standing static balance: feet together eyes closed (30-60 seconds), tandem stance eyes open (1-2x30 seconds each side), half-tandem stance eyes open 1x30 seconds each side.   Additional time (9 min unbilled) spent completing FOTO with clinician supervision to assist with iPad operation.    Pt required multimodal cuing for proper technique and to facilitate improved neuromuscular control, strength, range of motion, and functional ability resulting in improved performance and form.    HOME EXERCISE PROGRAM Access Code: [email or web address] URL: [email or web address] Date:  03/18/2021 Prepared by: Rosita Kea  Exercises Standing Romberg to 1/2 Tandem Stance - 1 x daily - 3 sets - 30 seconds hold    PT Education - 03/18/21 1126    Education Details exercise purpose/form. Education on diagnosis, prognosis, POC, anatomy and physiology of current condition    Person(s)  Educated Patient    Methods Explanation;Demonstration;Verbal cues;Tactile cues    Comprehension Verbalized understanding;Returned demonstration;Verbal cues required;Tactile cues required;Need further instruction            PT Short Term Goals - 03/18/21 2006      PT SHORT TERM GOAL #1   Title Be independent with initial home exercise program for self-management of symptoms.    Baseline Initial HEP to be established at visit 2 as appropriate (03/13/2021); Initial HEP established visit 2 (03/18/2021);    Time 3    Period Weeks    Status Partially Met    Target Date 04/03/21             PT Long Term Goals - 03/18/21 1209      PT LONG TERM GOAL #1   Title Be independent with a long-term home exercise program for self-management of symptoms.    Baseline Initial HEP to be established at visit 2 as appropriate (03/13/2021); Initial HEP provided visit 2 (03/18/2021);    Time 12    Period Weeks    Status Partially Met   TARGET DATE FOR ALL LONG TERM GOALS: 06/05/2021     PT LONG TERM GOAL #2   Title Demonstrate improved FOTO score by 10 units to demonstrate improvement in overall condition and self-reported functional ability.    Baseline to be measured vsiti 2 as appropriate (03/13/2021); 51 (03/18/2021);    Time 12    Period Weeks    Status On-going      PT LONG TERM GOAL #3   Title Patient will score equal or greater than 25/30 on Functional Gait Assessment to demonstrate reduction in fall risk to low fall risk classification.    Baseline to be measured vsiti 2 as appropriate (03/13/2021); 20/30 moderate fall risk (03/18/2021);    Time 12    Period Weeks    Status New      PT LONG TERM  GOAL #4   Title Patient will ambulate equal or greater than 1000 feet during 6 minute walk test to demonstrate improved community mobility.    Baseline to be measured vsiti 2 as appropriate (03/13/2021); 820 feet with SPC(03/18/2021);    Time 12    Period Weeks    Status On-going      PT LONG TERM GOAL #5   Title Complete community, work and/or recreational activities with 50% less limitation due to current condition.    Baseline Functional Limitations: difficulty with activities that require standing balance including outside activities, taking the puppy out, prolonged standing, household chores, community ambulation, grooming, climbing stairs, yard work, sleeping, fall risk, etc. cannot do what he wants to outside because he is afraid to fall (03/13/2021);    Time 12    Period Weeks    Status On-going                 Plan - 03/18/21 2005    Clinical Impression Statement Patient tolerated treatment well overall with some difficulty due to quick fatigue, low back and L ankle pain with prolonged standing. Demonstrated Moderate Fall Risk on FGA and 00.97 m/second on 10MWT which is slower than needed for safe street crossing. Covered 820 feet during 6MWT which is below age predicted norm. Demonstrated inadequate use of SPC by carrying it for most of FGA. Established initial HEP with static balance that patient demonstrated ability to complete safely in the clinic. Patient would benefit from continued management of limiting condition by skilled physical therapist to address  remaining impairments and functional limitations to work towards stated goals and return to PLOF or maximal functional independence    Personal Factors and Comorbidities Age;Comorbidity 3+;Past/Current Experience;Fitness;Time since onset of injury/illness/exacerbation    Comorbidities Relevant past medical history and comorbidities include seizure disorder (controlled with meds), hx R ankle fracture (wears an air brace to  prevent pain for years), ACDF (05/2018 for myelopathy), R  Carpal tunnel release (06/2019), BPH, symptoms of peripheral neuropathy at all limbs (fine motor skills in B UE affected), CTS, perfuse degenerative changes on lumbar and cervical spine imaging (see chart for details).    Examination-Activity Limitations Bend;Locomotion Level;Stand;Carry;Sleep;Hygiene/Grooming;Caring for Others;Lift;Stairs;Squat    Examination-Participation Restrictions Shop;Community Activity;Meal Prep;Valla Leaver Work   activities that require standing balance including outside activities, taking the puppy out, prolonged standing, household chores, community ambulation, grooming, climbing stairs, yard work, sleeping, fall risk, etc.   Stability/Clinical Decision Making Evolving/Moderate complexity    Rehab Potential Fair    PT Frequency 2x / week    PT Duration 12 weeks    PT Treatment/Interventions ADLs/Self Care Home Management;Cryotherapy;Traction;Moist Heat;Electrical Stimulation;DME Instruction;Gait training;Stair training;Functional mobility training;Therapeutic activities;Therapeutic exercise;Balance training;Neuromuscular re-education;Patient/family education;Passive range of motion;Dry needling;Manual techniques;Joint Manipulations;Spinal Manipulations    PT Next Visit Plan balance training, LE strength/power    PT Home Exercise Plan Medbridge Access Code: [email or web address]    Consulted and Agree with Plan of Care Patient           Patient will benefit from skilled therapeutic intervention in order to improve the following deficits and impairments:  Abnormal gait,Decreased knowledge of use of DME,Impaired sensation,Improper body mechanics,Pain,Decreased coordination,Decreased mobility,Postural dysfunction,Decreased activity tolerance,Decreased endurance,Decreased range of motion,Decreased strength,Hypomobility,Impaired perceived functional ability,Impaired UE functional use,Decreased balance,Difficulty walking,Impaired  flexibility  Visit Diagnosis: Unsteadiness on feet  Chronic bilateral low back pain, unspecified whether sciatica present  Difficulty in walking, not elsewhere classified  History of falling     Problem List Patient Active Problem List   Diagnosis Date Noted  . Hearing loss due to cerumen impaction 03/01/2021  . Atypical chest pain 07/10/2020  . 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 Unsteadiness on feet 11/08/2019  . History of spinal surgery 01/17/2019  . Neck pain 01/17/2019  . Weakness of hand 01/17/2019  . Benign prostatic hyperplasia with urinary obstruction 01/17/2019  . Systolic murmur 01/41/0301  . Right carpal tunnel syndrome 02/02/2018  . Ulnar neuropathy at elbow, left 02/02/2018  . Peripheral neuropathy 01/26/2018  . Bilateral buttock pain 06/29/2016  . Failed vision screen 06/29/2016  . Advanced care planning/counseling discussion 06/21/2015  . Medicare annual wellness visit, subsequent 06/18/2014  . Health maintenance examination 06/18/2014  . Chronic ankle pain 06/18/2014  . Incomplete emptying of bladder 06/07/2013  . Vitamin D deficiency 05/20/2013  . THYROID NODULE 05/17/2007  . HLD (hyperlipidemia) 05/17/2007  . TREMOR, ESSENTIAL 05/17/2007  . Essential hypertension 05/17/2007  . Cervical spondylosis with myelopathy and radiculopathy 05/17/2007  . Seizure disorder ([place]) 05/17/2007    Everlean Alstrom. Graylon Good, PT, DPT 03/18/21, 8:12 PM  [facility] [address] [place], [address] Phone: [phone]   Fax:  [phone]  Name: Mark Holmes MRN: [id number] Date of Birth: 1935/11/02

## 2021-03-20 ENCOUNTER — Ambulatory Visit: Payer: Medicare Other | Admitting: Physical Therapy

## 2021-03-20 ENCOUNTER — Other Ambulatory Visit: Payer: Self-pay

## 2021-03-20 ENCOUNTER — Encounter: Payer: Self-pay | Admitting: Physical Therapy

## 2021-03-20 DIAGNOSIS — M545 Low back pain, unspecified: Secondary | ICD-10-CM

## 2021-03-20 DIAGNOSIS — R262 Difficulty in walking, not elsewhere classified: Secondary | ICD-10-CM

## 2021-03-20 DIAGNOSIS — R2681 Unsteadiness on feet: Secondary | ICD-10-CM

## 2021-03-20 DIAGNOSIS — G8929 Other chronic pain: Secondary | ICD-10-CM

## 2021-03-20 DIAGNOSIS — Z9181 History of falling: Secondary | ICD-10-CM

## 2021-03-20 NOTE — Therapy (Signed)
[facility] [address] [place], [address] Phone: [phone]   Fax:  [phone]  Physical Therapy Treatment  Patient Details  Name: Mark Holmes MRN: [id number] Date of Birth: 1935-02-22 Referring Provider (PT): Allene Dillon, NP   Encounter Date: 03/20/2021   PT End of Session - 03/20/21 1122    Visit Number 3    Number of Visits 24    Date for PT Re-Evaluation 06/05/21    Authorization Type [employer] MEDICARE reporting period from 03/13/2021    Progress Note Due on Visit 10    PT Start Time 1118    PT Stop Time 1158    PT Time Calculation (min) 40 min    Equipment Utilized During Treatment Gait belt    Activity Tolerance Patient tolerated treatment well    Behavior During Therapy WFL for tasks assessed/performed   verbose          Past Medical History:  Diagnosis Date  . Benign prostatic hypertrophy with elevated PSA    per Dr. Jacqlyn Larsen  . History of CT scan of brain 1992   normal  . Hyperglycemia 01/2006   102  . Hyperlipemia   . Hypertension   . Seizure disorder [facility])     Past Surgical History:  Procedure Laterality Date  . ANTERIOR CERVICAL DECOMP/DISCECTOMY FUSION  05/2018   for myelopathy -C3/4 ([facility])  . CARPAL TUNNEL RELEASE Right 06/2019   ([facility])  . CATARACT EXTRACTION W/ INTRAOCULAR LENS IMPLANT Right 2012   [facility]  . History of EEG  1985   normal  . hospitalization  1981   observation for seizuare  . TONSILLECTOMY    . TRANSURETHRAL RESECTION OF PROSTATE  10/2005   [name]  . TRANSURETHRAL RESECTION OF PROSTATE  08/2017   (rpt) [facility]    There were no vitals filed for this visit.   Subjective Assessment - 03/20/21 1119    Subjective Patient reports he has pain in his low back when he was shaving today 7/10 and he ended up sitting on the top step of the stairs when he started to go down and felt his back "gave out."    Pertinent History Patient  is a 86 y.o. male who presents to outpatient physical therapy with a referral for medical diagnosis difficulty balancing, lumbar DDD (degenerative disc disease) with request for balance therapy. This patient's chief complaints consist of discomfort of the B LE below the knee, pain in the low back with standing, and difficulty balancing in the setting of chronic R ankle pain, B hand neuropathy symptoms and history of cervical spine disorder with ACDF at C3-4 for myelopathy leading to the following functional deficits: cannot do what he wants to outside because he is afraid to fall, taking the puppy out, prolonged standing, household chores, community ambulation, grooming, climbing stairs, yard work, sleeping, fall risk, etc. .  Relevant past medical history and comorbidities include seizure disorder (controlled with meds), hx R ankle fracture (wears an air brace to prevent pain for years), ACDF (05/2018 for myelopathy), R  Carpal tunnel release (06/2019), BPH, symptoms of peripheral neuropathy at all limbs (fine motor skills in B UE affected), CTS, perfuse degenerative changes on lumbar and cervical spine imaging (see chart for details).    Limitations Standing;Walking;Sitting;Other (comment);House hold activities   Functional Limitations: cannot do what he wants to outside because he is afraid to fall, taking the puppy out, prolonged standing, household chores, community  ambulation, grooming, climbing stairs, yard work, sleeping.   How long can you sit comfortably? "a long time' (but will feelit when he stands up)    How long can you stand comfortably? can finish the task but gets really uncomfortable in the back    Diagnostic tests Cervical spine MRI report 11/29/2020: " IMPRESSION:  1. Neurofibroma of the left C2 nerve as seen previously, maximal  transverse diameter 11-12 mm.  2. Previous ACDF C3-4. Canal stenosis remains at this level with AP  diameter of 7 mm, without worsening. No actual cord compression.   3. Spondylosis and facet arthropathy with degenerative  anterolisthesis of C5-6 and C7-T1. Canal narrowing but no cord  compression.  4. Foraminal stenosis primarily due to facet osteophytes and  uncovertebral osteophytes that could cause neural compression  bilateral at C2-3, bilateral at C3-4, bilateral at C4-5, bilateral  but left more than right at C5-6, bilateral but left more than right  at C6-7 and bilateral but left more than right at C7-T1." Lumbar spine MRI report 11/29/2020: "IMPRESSION:  1. Degenerative disc disease and degenerative facet disease  throughout the lumbar region which could be associated with back  pain. Thoracolumbar scoliotic curvature.  2. L2-3: Moderate central canal stenosis. Bilateral lateral recess  and foraminal stenosis left worse than right. Neural compression  could occur at this level, particularly on the left.  3. L3-4: Bilateral lateral recess and foraminal stenosis, right  worse than left. Neural compression could occur on either side,  particularly on the right.  4. L4-5: Bilateral lateral recess and foraminal stenosis, right more  than left. Neural compression could occur on either side,  particularly the right.  5. L5-S1: Bilateral foraminal narrowing with some potential to  affect either exiting L5 nerve."    Patient Stated Goals he would like to walk "like normal."    Currently in Pain? Yes    Pain Score 7     Pain Location Back   when standing   Pain Onset More than a month ago          OBJECTIVE  SLR positive for posterior thigh pain bilaterally,  FABER: R hip positive for lateral hip pain; L hip positive for groin pain   Hip PROM WFL bilaterally except loss of hip extension bilaterally.   MMT  Hip abduction : R = 4/5; L = 4+/5 Hip extension: R = 4+/5; L = 4+/5  REPEATED MOVEMENT  Prone press up, feels across back. Possibly feels weakness in lower legs (worse than when he arrived but no hip pain, unclear).   Standing lumbar extension (after  prolonged standing). Weak feeling at left hip/back   ACCESSORY MOTION CPA over mid thoracic to lumbar spine hypermobile for age/gender, localized pain, no reproduction of leg or glute pain.   PALPATION TTP in bilateral deep posterior hip rotator/glute region, L > R, no reproduction of leg pain.    TREATMENT:    Neuromuscular Re-education: to improve, balance, postural strength, muscle activation patterns, and stabilization strength required for functional activities (CGA - min A as needed for safety): - standing static balance: half-tandem stance eyes open 3x30 seconds each side. feet together eyes closed (30-60 seconds),    Hurdle activities (6 inch):  - forward step and push back to backward step and push back with intermittent UE support. 1x10 each side.  - side step and push back over hurdle, 1x10 each side  - step on/off airex pad with each foot (stradle/together) with no UE support,  1x10 leading with each foot. - backwards walking with CGA-min A 4x30 feet.   Therapeutic exercise:to centralize symptoms and improve ROM, strength, muscular endurance, and activity tolerance required for successful completion of functional activities.  - seated hamstring ball rolls with 3kg med ball with back unsupported, 1x15-20 each side (states it feels good) - examination to further assess condition (see above) - lower trunk rotation x 10 each side - Education on HEP including handout    Pt required multimodal cuing for proper technique and to facilitate improved neuromuscular control, strength, range of motion, and functional ability resulting in improved performance and form.  Manual therapy: to reduce pain and tissue tension, improve range of motion, neuromodulation, in order to promote improved ability to complete functional activities.  - prone CPA along thoracic and lumbar spine, grade III-IV as tolerated for improved mobility and to assess response. (tender at lower lumbar levels but  feels better with repetition).    HOME EXERCISE PROGRAM Access Code: [email or web address] URL: [email or web address] Date: 03/20/2021 Prepared by: Rosita Kea  Exercises Standing Romberg to 1/2 Tandem Stance - 1 x daily - 3 sets - 30 seconds hold Supine Lower Trunk Rotation - 1-2 x daily - 1 sets - 20 reps - 2-5 seconds hold     PT Education - 03/20/21 1520    Education Details exercise purpose/form    Person(s) Educated Patient    Methods Explanation;Demonstration;Tactile cues;Verbal cues    Comprehension Verbalized understanding;Returned demonstration;Verbal cues required;Tactile cues required;Need further instruction            PT Short Term Goals - 03/20/21 1527      PT SHORT TERM GOAL #1   Title Be independent with initial home exercise program for self-management of symptoms.    Baseline Initial HEP to be established at visit 2 as appropriate (03/13/2021); Initial HEP established visit 2 (03/18/2021);    Time 3    Period Weeks    Status Achieved    Target Date 04/03/21             PT Long Term Goals - 03/18/21 1209      PT LONG TERM GOAL #1   Title Be independent with a long-term home exercise program for self-management of symptoms.    Baseline Initial HEP to be established at visit 2 as appropriate (03/13/2021); Initial HEP provided visit 2 (03/18/2021);    Time 12    Period Weeks    Status Partially Met   TARGET DATE FOR ALL LONG TERM GOALS: 06/05/2021     PT LONG TERM GOAL #2   Title Demonstrate improved FOTO score by 10 units to demonstrate improvement in overall condition and self-reported functional ability.    Baseline to be measured vsiti 2 as appropriate (03/13/2021); 51 (03/18/2021);    Time 12    Period Weeks    Status On-going      PT LONG TERM GOAL #3   Title Patient will score equal or greater than 25/30 on Functional Gait Assessment to demonstrate reduction in fall risk to low fall risk classification.    Baseline to be measured vsiti 2 as  appropriate (03/13/2021); 20/30 moderate fall risk (03/18/2021);    Time 12    Period Weeks    Status New      PT LONG TERM GOAL #4   Title Patient will ambulate equal or greater than 1000 feet during 6 minute walk test to demonstrate improved community mobility.    Baseline to be measured vsiti 2  as appropriate (03/13/2021); 820 feet with SPC(03/18/2021);    Time 12    Period Weeks    Status On-going      PT LONG TERM GOAL #5   Title Complete community, work and/or recreational activities with 50% less limitation due to current condition.    Baseline Functional Limitations: difficulty with activities that require standing balance including outside activities, taking the puppy out, prolonged standing, household chores, community ambulation, grooming, climbing stairs, yard work, sleeping, fall risk, etc. cannot do what he wants to outside because he is afraid to fall (03/13/2021);    Time 12    Period Weeks    Status On-going                 Plan - 03/20/21 1526    Clinical Impression Statement Patient tolerated treatment well overall but continues to complain of increase feeling of instability in back and hips after prolonged standing as well as back pain. Continues to ambulate with mild ataxia. Demonstrates remarkable lumbar extension and lumbar joint mobility for his age/condition/gender. Was challenged by balance exercises and tends to bend forward while concentrating. Does report standing up straight feels better at some points but that standing in general increases low back pian. Patient would benefit from continued management of limiting condition by skilled physical therapist to address remaining impairments and functional limitations to work towards stated goals and return to PLOF or maximal functional independence.    Personal Factors and Comorbidities Age;Comorbidity 3+;Past/Current Experience;Fitness;Time since onset of injury/illness/exacerbation    Comorbidities Relevant past  medical history and comorbidities include seizure disorder (controlled with meds), hx R ankle fracture (wears an air brace to prevent pain for years), ACDF (05/2018 for myelopathy), R  Carpal tunnel release (06/2019), BPH, symptoms of peripheral neuropathy at all limbs (fine motor skills in B UE affected), CTS, perfuse degenerative changes on lumbar and cervical spine imaging (see chart for details).    Examination-Activity Limitations Bend;Locomotion Level;Stand;Carry;Sleep;Hygiene/Grooming;Caring for Others;Lift;Stairs;Squat    Examination-Participation Restrictions Shop;Community Activity;Meal Prep;[employer]aver Work   activities that require standing balance including outside activities, taking the puppy out, prolonged standing, household chores, community ambulation, grooming, climbing stairs, yard work, sleeping, fall risk, etc.   Stability/Clinical Decision Making Evolving/Moderate complexity    Rehab Potential Fair    PT Frequency 2x / week    PT Duration 12 weeks    PT Treatment/Interventions ADLs/Self Care Home Management;Cryotherapy;Traction;Moist Heat;Electrical Stimulation;DME Instruction;Gait training;Stair training;Functional mobility training;Therapeutic activities;Therapeutic exercise;Balance training;Neuromuscular re-education;Patient/family education;Passive range of motion;Dry needling;Manual techniques;Joint Manipulations;Spinal Manipulations    PT Next Visit Plan balance training, LE strength/power    PT Home Exercise Plan Medbridge Access Code: [email or web address]    Consulted and Agree with Plan of Care Patient           Patient will benefit from skilled therapeutic intervention in order to improve the following deficits and impairments:  Abnormal gait,Decreased knowledge of use of DME,Impaired sensation,Improper body mechanics,Pain,Decreased coordination,Decreased mobility,Postural dysfunction,Decreased activity tolerance,Decreased endurance,Decreased range of motion,Decreased  strength,Hypomobility,Impaired perceived functional ability,Impaired UE functional use,Decreased balance,Difficulty walking,Impaired flexibility  Visit Diagnosis: Unsteadiness on feet  Chronic bilateral low back pain, unspecified whether sciatica present  Difficulty in walking, not elsewhere classified  History of falling     Problem List Patient Active Problem List   Diagnosis Date Noted  . Hearing loss due to cerumen impaction 03/01/2021  . Atypical chest pain 07/10/2020  . 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 Unsteadiness on feet 11/08/2019  . History of spinal surgery 01/17/2019  .  Neck pain 01/17/2019  . Weakness of hand 01/17/2019  . Benign prostatic hyperplasia with urinary obstruction 01/17/2019  . Systolic murmur 86/16/1224  . Right carpal tunnel syndrome 02/02/2018  . Ulnar neuropathy at elbow, left 02/02/2018  . Peripheral neuropathy 01/26/2018  . Bilateral buttock pain 06/29/2016  . Failed vision screen 06/29/2016  . Advanced care planning/counseling discussion 06/21/2015  . Medicare annual wellness visit, subsequent 06/18/2014  . Health maintenance examination 06/18/2014  . Chronic ankle pain 06/18/2014  . Incomplete emptying of bladder 06/07/2013  . Vitamin D deficiency 05/20/2013  . THYROID NODULE 05/17/2007  . HLD (hyperlipidemia) 05/17/2007  . TREMOR, ESSENTIAL 05/17/2007  . Essential hypertension 05/17/2007  . Cervical spondylosis with myelopathy and radiculopathy 05/17/2007  . Seizure disorder ([place]) 05/17/2007   Everlean Alstrom. Graylon Good, PT, DPT 03/20/21, 3:29 PM  [facility] [address] [place], [address] Phone: [phone]   Fax:  [phone]  Name: Mark Holmes MRN: [id number] Date of Birth: 03-16-1935

## 2021-03-25 ENCOUNTER — Encounter: Payer: Self-pay | Admitting: Physical Therapy

## 2021-03-25 ENCOUNTER — Other Ambulatory Visit: Payer: Self-pay

## 2021-03-25 ENCOUNTER — Ambulatory Visit: Payer: Medicare Other | Attending: Family Medicine | Admitting: Physical Therapy

## 2021-03-25 DIAGNOSIS — Z9181 History of falling: Secondary | ICD-10-CM | POA: Diagnosis present

## 2021-03-25 DIAGNOSIS — M545 Low back pain, unspecified: Secondary | ICD-10-CM | POA: Insufficient documentation

## 2021-03-25 DIAGNOSIS — R262 Difficulty in walking, not elsewhere classified: Secondary | ICD-10-CM | POA: Diagnosis present

## 2021-03-25 DIAGNOSIS — G8929 Other chronic pain: Secondary | ICD-10-CM | POA: Diagnosis present

## 2021-03-25 DIAGNOSIS — R2681 Unsteadiness on feet: Secondary | ICD-10-CM | POA: Diagnosis not present

## 2021-03-25 NOTE — Therapy (Signed)
[facility] [address], [place], [address] Phone: [phone]   Fax:  [phone]  Physical Therapy Treatment  Patient Details  Name: Mark Holmes MRN: [id number] Date of Birth: [birth year] Referring Provider (PT): Allene Dillon, NP   Encounter Date: 03/25/2021   PT End of Session - 03/25/21 1641    Visit Number 4    Number of Visits 24    Date for PT Re-Evaluation 06/05/21    Authorization Type [employer] MEDICARE reporting period from 03/13/2021    Progress Note Due on Visit 10    PT Start Time 1347    PT Stop Time 1428    PT Time Calculation (min) 41 min    Equipment Utilized During Treatment Gait belt    Activity Tolerance Patient tolerated treatment well    Behavior During Therapy [facility] for tasks assessed/performed   verbose          Past Medical History:  Diagnosis Date  . Benign prostatic hypertrophy with elevated PSA    per Dr. Jacqlyn Larsen  . History of CT scan of brain 1992   normal  . Hyperglycemia 01/2006   102  . Hyperlipemia   . Hypertension   . Seizure disorder [facility])     Past Surgical History:  Procedure Laterality Date  . ANTERIOR CERVICAL DECOMP/DISCECTOMY FUSION  05/2018   for myelopathy -C3/4 ([facility])  . CARPAL TUNNEL RELEASE Right 06/2019   ([facility])  . CATARACT EXTRACTION W/ INTRAOCULAR LENS IMPLANT Right 2012   [facility]  . History of EEG  1985   normal  . hospitalization  1981   observation for seizuare  . TONSILLECTOMY    . TRANSURETHRAL RESECTION OF PROSTATE  10/2005   [name]  . TRANSURETHRAL RESECTION OF PROSTATE  08/2017   (rpt) [facility]    There were no vitals filed for this visit.   Subjective Assessment - 03/25/21 1349    Subjective Pateint reports no pain upon arrival. States he was stiff like he had not done activity in a while following last treatment session. Continues to have lower leg pain in the mornings. HEP is going well at home.     Pertinent History Patient is a 86 y.o. male who presents to outpatient physical therapy with a referral for medical diagnosis difficulty balancing, lumbar DDD (degenerative disc disease) with request for balance therapy. This patient's chief complaints consist of discomfort of the B LE below the knee, pain in the low back with standing, and difficulty balancing in the setting of chronic R ankle pain, B hand neuropathy symptoms and history of cervical spine disorder with ACDF at C3-4 for myelopathy leading to the following functional deficits: cannot do what he wants to outside because he is afraid to fall, taking the puppy out, prolonged standing, household chores, community ambulation, grooming, climbing stairs, yard work, sleeping, fall risk, etc. .  Relevant past medical history and comorbidities include seizure disorder (controlled with meds), hx R ankle fracture (wears an air brace to prevent pain for years), ACDF (05/2018 for myelopathy), R  Carpal tunnel release (06/2019), BPH, symptoms of peripheral neuropathy at all limbs (fine motor skills in B UE affected), CTS, perfuse degenerative changes on lumbar and cervical spine imaging (see chart for details).    Limitations Standing;Walking;Sitting;Other (comment);House hold activities   Functional Limitations: cannot do what he wants to outside because he is afraid to fall, taking the puppy out, prolonged standing, household chores, community ambulation,  grooming, climbing stairs, yard work, sleeping.   How long can you sit comfortably? "a long time' (but will feelit when he stands up)    How long can you stand comfortably? can finish the task but gets really uncomfortable in the back    Diagnostic tests Cervical spine MRI report 11/29/2020: " IMPRESSION:  1. Neurofibroma of the left C2 nerve as seen previously, maximal  transverse diameter 11-12 mm.  2. Previous ACDF C3-4. Canal stenosis remains at this level with AP  diameter of 7 mm, without worsening. No  actual cord compression.  3. Spondylosis and facet arthropathy with degenerative  anterolisthesis of C5-6 and C7-T1. Canal narrowing but no cord  compression.  4. Foraminal stenosis primarily due to facet osteophytes and  uncovertebral osteophytes that could cause neural compression  bilateral at C2-3, bilateral at C3-4, bilateral at C4-5, bilateral  but left more than right at C5-6, bilateral but left more than right  at C6-7 and bilateral but left more than right at C7-T1." Lumbar spine MRI report 11/29/2020: "IMPRESSION:  1. Degenerative disc disease and degenerative facet disease  throughout the lumbar region which could be associated with back  pain. Thoracolumbar scoliotic curvature.  2. L2-3: Moderate central canal stenosis. Bilateral lateral recess  and foraminal stenosis left worse than right. Neural compression  could occur at this level, particularly on the left.  3. L3-4: Bilateral lateral recess and foraminal stenosis, right  worse than left. Neural compression could occur on either side,  particularly on the right.  4. L4-5: Bilateral lateral recess and foraminal stenosis, right more  than left. Neural compression could occur on either side,  particularly the right.  5. L5-S1: Bilateral foraminal narrowing with some potential to  affect either exiting L5 nerve."    Patient Stated Goals he would like to walk "like normal."    Currently in Pain? No/denies    Pain Onset More than a month ago           TREATMENT:   Neuromuscular Re-education:to improve, balance, postural strength, muscle activation patterns, and stabilization strength required for functional activities (CGA - min A as needed for safety): - standing static balance: narrow stance eyes closed 3x30 seconds, intermittent touch down.  Hurdle activities (6 inch):  - forward step and push back to backward step and push back with intermittent UE support. 1x10 each side.  - side step and push back over hurdle, 1x20 each side  (reports it produced LE "weak" feeling equal bilaterally, then says R > L).   - step on/off airex pad with each foot (stradle/together) with no UE support, 1x10 leading with each foot. - agility ladder drills: 2x each direction with forward/backwards pattern and side stepping back and forth. With CGA - min A.  Therapeutic exercise:to centralize symptoms and improve ROM, strength, muscular endurance, and activity tolerance required for successful completion of functional activities. - standing lumbar extension 1 x10 with CGA for safety.  - squats to 17 inch chair with intermittent sit/buttocks tap and intermittant UE support on TM bar. 1x10, 1x6.   - seated hamstring ball rolls with 3kg med ball with back unsupported, x40 each side (states it feels good) - sit <> stand with 3kg med ball toss, 1-2x10   Pt required multimodal cuing for proper technique and to facilitate improved neuromuscular control, strength, range of motion, and functional ability resulting in improved performance and form.  HOME EXERCISE PROGRAM Access Code: [email or web address] URL: [email or web address] Date: 03/20/2021 Prepared by: Rosita Kea  Exercises Standing Romberg to 1/2 Tandem Stance - 1 x daily - 3 sets - 30 seconds hold Supine Lower Trunk Rotation - 1-2 x daily - 1 sets - 20 reps - 2-5 seconds hold   PT Education - 03/25/21 1640    Education Details exercise purpose/form    Person(s) Educated Patient    Methods Explanation;Demonstration;Tactile cues;Verbal cues    Comprehension Verbalized understanding;Returned demonstration;Verbal cues required;Tactile cues required;Need further instruction            PT Short Term Goals - 03/20/21 1527      PT SHORT TERM GOAL #1   Title Be independent with initial home exercise program for self-management of symptoms.    Baseline Initial HEP to be established at visit 2 as appropriate (03/13/2021); Initial HEP established visit 2 (03/18/2021);    Time 3     Period Weeks    Status Achieved    Target Date 04/03/21             PT Long Term Goals - 03/18/21 1209      PT LONG TERM GOAL #1   Title Be independent with a long-term home exercise program for self-management of symptoms.    Baseline Initial HEP to be established at visit 2 as appropriate (03/13/2021); Initial HEP provided visit 2 (03/18/2021);    Time 12    Period Weeks    Status Partially Met   TARGET DATE FOR ALL LONG TERM GOALS: 06/05/2021     PT LONG TERM GOAL #2   Title Demonstrate improved FOTO score by 10 units to demonstrate improvement in overall condition and self-reported functional ability.    Baseline to be measured vsiti 2 as appropriate (03/13/2021); 51 (03/18/2021);    Time 12    Period Weeks    Status On-going      PT LONG TERM GOAL #3   Title Patient will score equal or greater than 25/30 on Functional Gait Assessment to demonstrate reduction in fall risk to low fall risk classification.    Baseline to be measured vsiti 2 as appropriate (03/13/2021); 20/30 moderate fall risk (03/18/2021);    Time 12    Period Weeks    Status New      PT LONG TERM GOAL #4   Title Patient will ambulate equal or greater than 1000 feet during 6 minute walk test to demonstrate improved community mobility.    Baseline to be measured vsiti 2 as appropriate (03/13/2021); 820 feet with SPC(03/18/2021);    Time 12    Period Weeks    Status On-going      PT LONG TERM GOAL #5   Title Complete community, work and/or recreational activities with 50% less limitation due to current condition.    Baseline Functional Limitations: difficulty with activities that require standing balance including outside activities, taking the puppy out, prolonged standing, household chores, community ambulation, grooming, climbing stairs, yard work, sleeping, fall risk, etc. cannot do what he wants to outside because he is afraid to fall (03/13/2021);    Time 12    Period Weeks    Status On-going                  Plan - 03/25/21 1645    Clinical Impression Statement Patient tolerated treatment well overall with some complaint of lower leg discomfort and low back pain. Unclear, but did seem that prolonged standing or weight bearing exercises increased symptoms. Lumbar extension in standing felt good to patient and did not seem  to have a significant impact on LE symptoms. Patient does have considerable ROM in low back extension for his age/gender. Continued to work on balance and functional strengthening.  Patient would benefit from continued management of limiting condition by skilled physical therapist to address remaining impairments and functional limitations to work towards stated goals and return to PLOF or maximal functional independence    Personal Factors and Comorbidities Age;Comorbidity 3+;Past/Current Experience;Fitness;Time since onset of injury/illness/exacerbation    Comorbidities Relevant past medical history and comorbidities include seizure disorder (controlled with meds), hx R ankle fracture (wears an air brace to prevent pain for years), ACDF (05/2018 for myelopathy), R  Carpal tunnel release (06/2019), BPH, symptoms of peripheral neuropathy at all limbs (fine motor skills in B UE affected), CTS, perfuse degenerative changes on lumbar and cervical spine imaging (see chart for details).    Examination-Activity Limitations Bend;Locomotion Level;Stand;Carry;Sleep;Hygiene/Grooming;Caring for Others;Lift;Stairs;Squat    Examination-Participation Restrictions Shop;Community Activity;Meal Prep;Valla Leaver Work   activities that require standing balance including outside activities, taking the puppy out, prolonged standing, household chores, community ambulation, grooming, climbing stairs, yard work, sleeping, fall risk, etc.   Stability/Clinical Decision Making Evolving/Moderate complexity    Rehab Potential Fair    PT Frequency 2x / week    PT Duration 12 weeks    PT Treatment/Interventions  ADLs/Self Care Home Management;Cryotherapy;Traction;Moist Heat;Electrical Stimulation;DME Instruction;Gait training;Stair training;Functional mobility training;Therapeutic activities;Therapeutic exercise;Balance training;Neuromuscular re-education;Patient/family education;Passive range of motion;Dry needling;Manual techniques;Joint Manipulations;Spinal Manipulations    PT Next Visit Plan balance training, LE strength/power    PT Home Exercise Plan Medbridge Access Code: [email or web address]    Consulted and Agree with Plan of Care Patient           Patient will benefit from skilled therapeutic intervention in order to improve the following deficits and impairments:  Abnormal gait,Decreased knowledge of use of DME,Impaired sensation,Improper body mechanics,Pain,Decreased coordination,Decreased mobility,Postural dysfunction,Decreased activity tolerance,Decreased endurance,Decreased range of motion,Decreased strength,Hypomobility,Impaired perceived functional ability,Impaired UE functional use,Decreased balance,Difficulty walking,Impaired flexibility  Visit Diagnosis: Unsteadiness on feet  Chronic bilateral low back pain, unspecified whether sciatica present  Difficulty in walking, not elsewhere classified  History of falling     Problem List Patient Active Problem List   Diagnosis Date Noted  . Hearing loss due to cerumen impaction 03/01/2021  . Atypical chest pain 07/10/2020  . 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 Unsteadiness on feet 11/08/2019  . History of spinal surgery 01/17/2019  . Neck pain 01/17/2019  . Weakness of hand 01/17/2019  . Benign prostatic hyperplasia with urinary obstruction 01/17/2019  . Systolic murmur 01/60/1093  . Right carpal tunnel syndrome 02/02/2018  . Ulnar neuropathy at elbow, left 02/02/2018  . Peripheral neuropathy 01/26/2018  . Bilateral buttock pain 06/29/2016  . Failed vision screen 06/29/2016  . Advanced care planning/counseling discussion 06/21/2015  .  Medicare annual wellness visit, subsequent 06/18/2014  . Health maintenance examination 06/18/2014  . Chronic ankle pain 06/18/2014  . Incomplete emptying of bladder 06/07/2013  . Vitamin D deficiency 05/20/2013  . THYROID NODULE 05/17/2007  . HLD (hyperlipidemia) 05/17/2007  . TREMOR, ESSENTIAL 05/17/2007  . Essential hypertension 05/17/2007  . Cervical spondylosis with myelopathy and radiculopathy 05/17/2007  . Seizure disorder ([place]) 05/17/2007   Everlean Alstrom. Graylon Good, PT, DPT 03/25/21, 4:46 PM  [facility] [address] [place], [address] Phone: [phone]   Fax:  [phone]  Name: CLIFORD SEQUEIRA MRN: [id number] Date of Birth: 11/29/1935

## 2021-03-27 ENCOUNTER — Other Ambulatory Visit: Payer: Self-pay

## 2021-03-27 ENCOUNTER — Ambulatory Visit: Payer: Medicare Other | Admitting: Physical Therapy

## 2021-03-27 ENCOUNTER — Encounter: Payer: Self-pay | Admitting: Physical Therapy

## 2021-03-27 DIAGNOSIS — Z9181 History of falling: Secondary | ICD-10-CM

## 2021-03-27 DIAGNOSIS — R262 Difficulty in walking, not elsewhere classified: Secondary | ICD-10-CM

## 2021-03-27 DIAGNOSIS — G8929 Other chronic pain: Secondary | ICD-10-CM

## 2021-03-27 DIAGNOSIS — M545 Low back pain, unspecified: Secondary | ICD-10-CM

## 2021-03-27 DIAGNOSIS — R2681 Unsteadiness on feet: Secondary | ICD-10-CM | POA: Diagnosis not present

## 2021-03-27 NOTE — Therapy (Signed)
[facility] [address] [place], [address] Phone: [phone]   Fax:  [phone]  Physical Therapy Treatment  Patient Details  Name: Mark Holmes MRN: [id number] Date of Birth: 21-Jun-1935 Referring Provider (PT): Allene Dillon, NP   Encounter Date: 03/27/2021   PT End of Session - 03/27/21 1502    Visit Number 5    Number of Visits 24    Date for PT Re-Evaluation 06/05/21    Authorization Type [employer] MEDICARE reporting period from 03/13/2021    Progress Note Due on Visit 10    PT Start Time 0947    PT Stop Time 1027    PT Time Calculation (min) 40 min    Equipment Utilized During Treatment Gait belt    Activity Tolerance Patient tolerated treatment well    Behavior During Therapy [facility] for tasks assessed/performed   verbose          Past Medical History:  Diagnosis Date  . Benign prostatic hypertrophy with elevated PSA    per Dr. Jacqlyn Larsen  . History of CT scan of brain 1992   normal  . Hyperglycemia 01/2006   102  . Hyperlipemia   . Hypertension   . Seizure disorder [facility])     Past Surgical History:  Procedure Laterality Date  . ANTERIOR CERVICAL DECOMP/DISCECTOMY FUSION  05/2018   for myelopathy -C3/4 ([facility])  . CARPAL TUNNEL RELEASE Right 06/2019   ([facility])  . CATARACT EXTRACTION W/ INTRAOCULAR LENS IMPLANT Right 2012   [facility]  . History of EEG  1985   normal  . hospitalization  1981   observation for seizuare  . TONSILLECTOMY    . TRANSURETHRAL RESECTION OF PROSTATE  10/2005   [name]  . TRANSURETHRAL RESECTION OF PROSTATE  08/2017   (rpt) [facility]    There were no vitals filed for this visit.   Subjective Assessment - 03/27/21 1500    Subjective Pateint reports he is feeling well but continues to have pain in his low back especially in the morning and odd/weak feeling in his lower legs. He wants to know if PT thinks there is something surgical that can be done  for his R ankle or back. Feels his symptoms in his legs may be from his back. States he was sore after last treatment session but would like to continue at similar intensity of exercise. States he must see Dr. Manuella Ghazi the neurologist before he can get nerve conduction testing. States he may have noticed he is walking a bit better, especially on the R LE.    Pertinent History Patient is a 86 y.o. male who presents to outpatient physical therapy with a referral for medical diagnosis difficulty balancing, lumbar DDD (degenerative disc disease) with request for balance therapy. This patient's chief complaints consist of discomfort of the B LE below the knee, pain in the low back with standing, and difficulty balancing in the setting of chronic R ankle pain, B hand neuropathy symptoms and history of cervical spine disorder with ACDF at C3-4 for myelopathy leading to the following functional deficits: cannot do what he wants to outside because he is afraid to fall, taking the puppy out, prolonged standing, household chores, community ambulation, grooming, climbing stairs, yard work, sleeping, fall risk, etc. .  Relevant past medical history and comorbidities include seizure disorder (controlled with meds), hx R ankle fracture (wears an air brace to prevent pain for years), ACDF (05/2018 for myelopathy), R  Carpal tunnel release (06/2019), BPH, symptoms of peripheral neuropathy at all limbs (fine motor skills in B UE affected), CTS, perfuse degenerative changes on lumbar and cervical spine imaging (see chart for details).    Limitations Standing;Walking;Sitting;Other (comment);House hold activities   Functional Limitations: cannot do what he wants to outside because he is afraid to fall, taking the puppy out, prolonged standing, household chores, community ambulation, grooming, climbing stairs, yard work, sleeping.   How long can you sit comfortably? "a long time' (but will feelit when he stands up)    How long can you  stand comfortably? can finish the task but gets really uncomfortable in the back    Diagnostic tests Cervical spine MRI report 11/29/2020: " IMPRESSION:  1. Neurofibroma of the left C2 nerve as seen previously, maximal  transverse diameter 11-12 mm.  2. Previous ACDF C3-4. Canal stenosis remains at this level with AP  diameter of 7 mm, without worsening. No actual cord compression.  3. Spondylosis and facet arthropathy with degenerative  anterolisthesis of C5-6 and C7-T1. Canal narrowing but no cord  compression.  4. Foraminal stenosis primarily due to facet osteophytes and  uncovertebral osteophytes that could cause neural compression  bilateral at C2-3, bilateral at C3-4, bilateral at C4-5, bilateral  but left more than right at C5-6, bilateral but left more than right  at C6-7 and bilateral but left more than right at C7-T1." Lumbar spine MRI report 11/29/2020: "IMPRESSION:  1. Degenerative disc disease and degenerative facet disease  throughout the lumbar region which could be associated with back  pain. Thoracolumbar scoliotic curvature.  2. L2-3: Moderate central canal stenosis. Bilateral lateral recess  and foraminal stenosis left worse than right. Neural compression  could occur at this level, particularly on the left.  3. L3-4: Bilateral lateral recess and foraminal stenosis, right  worse than left. Neural compression could occur on either side,  particularly on the right.  4. L4-5: Bilateral lateral recess and foraminal stenosis, right more  than left. Neural compression could occur on either side,  particularly the right.  5. L5-S1: Bilateral foraminal narrowing with some potential to  affect either exiting L5 nerve."    Patient Stated Goals he would like to walk "like normal."    Currently in Pain? No/denies    Pain Onset More than a month ago           TREATMENT:  Neuromuscular Re-education:to improve, balance, postural strength, muscle activation patterns, and stabilization strength  required for functional activities(CGA - min A as needed for safety): - standing static balance: narrow stance eyes closed 3x30 seconds, intermittent touch down.  Hurdle activities (6 inch):  - forward step and push back to backward step and push back with intermittent UE support. 1x10 each side.  - side step and push back over hurdle, 1x20 each side (reports it produced LE "weak" feeling equal bilaterally, then says R > L).   - step on/off airex pad with each foot (stradle/together) with no UE support, 1x10 leading with each foot. - ambulation 228 feet with 4# AW and CGA for safety, slow/fast speeds.  - backwards ambulation with 4# AW and CGA for safety, 2x20 feet.  - agility ladder drills: 2x each direction with forward/backwards pattern and side stepping back and forth. With CGA - min A. (fast pace second set)  Therapeutic exercise:to centralize symptoms and improve ROM, strength, muscular endurance, and activity tolerance required for successful completion of functional activities. - standing lumbar extension 1 x10 with CGA  for safety.  - standing fingers to toes 1x10.   - seated hamstring ball rolls with 3kg med ball with back unsupported, 2x20 each side (states it feels good) - sit <> stand with 3kg med ball toss, 3x10   Pt required multimodal cuing for proper technique and to facilitate improved neuromuscular control, strength, range of motion, and functional ability resulting in improved performance and form.  HOME EXERCISE PROGRAM Access Code: [email or web address] URL: [email or web address] Date: 03/20/2021 Prepared by: Rosita Kea  Exercises Standing Romberg to 1/2 Tandem Stance - 1 x daily - 3 sets - 30 seconds hold Supine Lower Trunk Rotation - 1-2 x daily - 1 sets - 20 reps - 2-5 seconds hold    PT Education - 03/27/21 1502    Education Details exercise purpose/form    Person(s) Educated Patient    Methods Explanation;Demonstration;Tactile cues;Verbal cues     Comprehension Verbalized understanding;Returned demonstration;Verbal cues required;Tactile cues required;Need further instruction            PT Short Term Goals - 03/20/21 1527      PT SHORT TERM GOAL #1   Title Be independent with initial home exercise program for self-management of symptoms.    Baseline Initial HEP to be established at visit 2 as appropriate (03/13/2021); Initial HEP established visit 2 (03/18/2021);    Time 3    Period Weeks    Status Achieved    Target Date 04/03/21             PT Long Term Goals - 03/18/21 1209      PT LONG TERM GOAL #1   Title Be independent with a long-term home exercise program for self-management of symptoms.    Baseline Initial HEP to be established at visit 2 as appropriate (03/13/2021); Initial HEP provided visit 2 (03/18/2021);    Time 12    Period Weeks    Status Partially Met   TARGET DATE FOR ALL LONG TERM GOALS: 06/05/2021     PT LONG TERM GOAL #2   Title Demonstrate improved FOTO score by 10 units to demonstrate improvement in overall condition and self-reported functional ability.    Baseline to be measured vsiti 2 as appropriate (03/13/2021); 51 (03/18/2021);    Time 12    Period Weeks    Status On-going      PT LONG TERM GOAL #3   Title Patient will score equal or greater than 25/30 on Functional Gait Assessment to demonstrate reduction in fall risk to low fall risk classification.    Baseline to be measured vsiti 2 as appropriate (03/13/2021); 20/30 moderate fall risk (03/18/2021);    Time 12    Period Weeks    Status New      PT LONG TERM GOAL #4   Title Patient will ambulate equal or greater than 1000 feet during 6 minute walk test to demonstrate improved community mobility.    Baseline to be measured vsiti 2 as appropriate (03/13/2021); 820 feet with SPC(03/18/2021);    Time 12    Period Weeks    Status On-going      PT LONG TERM GOAL #5   Title Complete community, work and/or recreational activities with 50% less  limitation due to current condition.    Baseline Functional Limitations: difficulty with activities that require standing balance including outside activities, taking the puppy out, prolonged standing, household chores, community ambulation, grooming, climbing stairs, yard work, sleeping, fall risk, etc. cannot do what he wants to outside because he is afraid  to fall (03/13/2021);    Time 12    Period Weeks    Status On-going                 Plan - 03/27/21 1506    Clinical Impression Statement Patient tolerated treatment well overall but did have a lot of difficulty with foot control and coordination towards the end of the hurdle exercises. Seemed that repeated lumbar extension may have worsened LE symptoms (at least not made them better) and flexion did not seem to improve them either. Continued to focus on balance and LE functional strength and coordination. Patient continues to demonstrate ataxia that is worse with fatigue. Continue to work within tolerance of R ankle pain. Educated patient on importance of improving strength at right ankle to decrease pain and improve function. Patient would benefit from continued management of limiting condition by skilled physical therapist to address remaining impairments and functional limitations to work towards stated goals and return to PLOF or maximal functional independence.    Personal Factors and Comorbidities Age;Comorbidity 3+;Past/Current Experience;Fitness;Time since onset of injury/illness/exacerbation    Comorbidities Relevant past medical history and comorbidities include seizure disorder (controlled with meds), hx R ankle fracture (wears an air brace to prevent pain for years), ACDF (05/2018 for myelopathy), R  Carpal tunnel release (06/2019), BPH, symptoms of peripheral neuropathy at all limbs (fine motor skills in B UE affected), CTS, perfuse degenerative changes on lumbar and cervical spine imaging (see chart for details).     Examination-Activity Limitations Bend;Locomotion Level;Stand;Carry;Sleep;Hygiene/Grooming;Caring for Others;Lift;Stairs;Squat    Examination-Participation Restrictions Shop;Community Activity;Meal Prep;Valla Leaver Work   activities that require standing balance including outside activities, taking the puppy out, prolonged standing, household chores, community ambulation, grooming, climbing stairs, yard work, sleeping, fall risk, etc.   Stability/Clinical Decision Making Evolving/Moderate complexity    Rehab Potential Fair    PT Frequency 2x / week    PT Duration 12 weeks    PT Treatment/Interventions ADLs/Self Care Home Management;Cryotherapy;Traction;Moist Heat;Electrical Stimulation;DME Instruction;Gait training;Stair training;Functional mobility training;Therapeutic activities;Therapeutic exercise;Balance training;Neuromuscular re-education;Patient/family education;Passive range of motion;Dry needling;Manual techniques;Joint Manipulations;Spinal Manipulations    PT Next Visit Plan balance training, LE strength/power    PT Home Exercise Plan Medbridge Access Code: [email or web address]    Consulted and Agree with Plan of Care Patient           Patient will benefit from skilled therapeutic intervention in order to improve the following deficits and impairments:  Abnormal gait,Decreased knowledge of use of DME,Impaired sensation,Improper body mechanics,Pain,Decreased coordination,Decreased mobility,Postural dysfunction,Decreased activity tolerance,Decreased endurance,Decreased range of motion,Decreased strength,Hypomobility,Impaired perceived functional ability,Impaired UE functional use,Decreased balance,Difficulty walking,Impaired flexibility  Visit Diagnosis: Unsteadiness on feet  Chronic bilateral low back pain, unspecified whether sciatica present  Difficulty in walking, not elsewhere classified  History of falling     Problem List Patient Active Problem List   Diagnosis Date Noted  . Hearing loss  due to cerumen impaction 03/01/2021  . Atypical chest pain 07/10/2020  . 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 Unsteadiness on feet 11/08/2019  . History of spinal surgery 01/17/2019  . Neck pain 01/17/2019  . Weakness of hand 01/17/2019  . Benign prostatic hyperplasia with urinary obstruction 01/17/2019  . Systolic murmur 02/63/7858  . Right carpal tunnel syndrome 02/02/2018  . Ulnar neuropathy at elbow, left 02/02/2018  . Peripheral neuropathy 01/26/2018  . Bilateral buttock pain 06/29/2016  . Failed vision screen 06/29/2016  . Advanced care planning/counseling discussion 06/21/2015  . Medicare annual wellness visit, subsequent 06/18/2014  . Health maintenance  examination 06/18/2014  . Chronic ankle pain 06/18/2014  . Incomplete emptying of bladder 06/07/2013  . Vitamin D deficiency 05/20/2013  . THYROID NODULE 05/17/2007  . HLD (hyperlipidemia) 05/17/2007  . TREMOR, ESSENTIAL 05/17/2007  . Essential hypertension 05/17/2007  . Cervical spondylosis with myelopathy and radiculopathy 05/17/2007  . Seizure disorder ([place]) 05/17/2007    Everlean Alstrom. Graylon Good, PT, DPT 03/27/21, 3:07 PM  [facility] [address] [place], [address] Phone: [phone]   Fax:  [phone]  Name: Mark Holmes MRN: [id number] Date of Birth: July 10, 1935

## 2021-03-31 ENCOUNTER — Other Ambulatory Visit: Payer: Self-pay

## 2021-03-31 ENCOUNTER — Ambulatory Visit: Payer: Medicare Other | Admitting: Physical Therapy

## 2021-03-31 DIAGNOSIS — G8929 Other chronic pain: Secondary | ICD-10-CM

## 2021-03-31 DIAGNOSIS — R2681 Unsteadiness on feet: Secondary | ICD-10-CM

## 2021-03-31 DIAGNOSIS — M545 Low back pain, unspecified: Secondary | ICD-10-CM

## 2021-03-31 DIAGNOSIS — Z9181 History of falling: Secondary | ICD-10-CM

## 2021-03-31 DIAGNOSIS — R262 Difficulty in walking, not elsewhere classified: Secondary | ICD-10-CM

## 2021-03-31 NOTE — Therapy (Signed)
[facility] [address] [place], [address] Phone: [phone]   Fax:  [phone]  Physical Therapy Treatment  Patient Details  Name: Mark Holmes MRN: [id number] Date of Birth: 09/09/1935 Referring Provider (PT): Allene Dillon, NP   Encounter Date: 03/31/2021   PT End of Session - 03/31/21 0945    Visit Number 6    Number of Visits 24    Date for PT Re-Evaluation 06/05/21    Authorization Type [employer] MEDICARE reporting period from 03/13/2021    Progress Note Due on Visit 10    PT Start Time 0900    PT Stop Time 0938    PT Time Calculation (min) 38 min    Equipment Utilized During Treatment Gait belt    Activity Tolerance Patient tolerated treatment well    Behavior During Therapy [facility] for tasks assessed/performed   verbose          Past Medical History:  Diagnosis Date  . Benign prostatic hypertrophy with elevated PSA    per Dr. Jacqlyn Larsen  . History of CT scan of brain 1992   normal  . Hyperglycemia 01/2006   102  . Hyperlipemia   . Hypertension   . Seizure disorder [facility])     Past Surgical History:  Procedure Laterality Date  . ANTERIOR CERVICAL DECOMP/DISCECTOMY FUSION  05/2018   for myelopathy -C3/4 ([facility])  . CARPAL TUNNEL RELEASE Right 06/2019   ([facility])  . CATARACT EXTRACTION W/ INTRAOCULAR LENS IMPLANT Right 2012   [facility]  . History of EEG  1985   normal  . hospitalization  1981   observation for seizuare  . TONSILLECTOMY    . TRANSURETHRAL RESECTION OF PROSTATE  10/2005   [name]  . TRANSURETHRAL RESECTION OF PROSTATE  08/2017   (rpt) [facility]    There were no vitals filed for this visit.   Subjective Assessment - 03/31/21 0946    Subjective Patient reports he has no pain upon arrival but states he continues to have odd feeling in lower legs, especially for a while when he gets up in the morning or after prolonged sitting. Also has low back pain at times.  No pain/soreness after last treatment session.    Pertinent History Patient is a 86 y.o. male who presents to outpatient physical therapy with a referral for medical diagnosis difficulty balancing, lumbar DDD (degenerative disc disease) with request for balance therapy. This patient's chief complaints consist of discomfort of the B LE below the knee, pain in the low back with standing, and difficulty balancing in the setting of chronic R ankle pain, B hand neuropathy symptoms and history of cervical spine disorder with ACDF at C3-4 for myelopathy leading to the following functional deficits: cannot do what he wants to outside because he is afraid to fall, taking the puppy out, prolonged standing, household chores, community ambulation, grooming, climbing stairs, yard work, sleeping, fall risk, etc. .  Relevant past medical history and comorbidities include seizure disorder (controlled with meds), hx R ankle fracture (wears an air brace to prevent pain for years), ACDF (05/2018 for myelopathy), R  Carpal tunnel release (06/2019), BPH, symptoms of peripheral neuropathy at all limbs (fine motor skills in B UE affected), CTS, perfuse degenerative changes on lumbar and cervical spine imaging (see chart for details).    Limitations Standing;Walking;Sitting;Other (comment);House hold activities   Functional Limitations: cannot do what he wants to outside because he is afraid to fall, taking  the puppy out, prolonged standing, household chores, community ambulation, grooming, climbing stairs, yard work, sleeping.   How long can you sit comfortably? "a long time' (but will feelit when he stands up)    How long can you stand comfortably? can finish the task but gets really uncomfortable in the back    Diagnostic tests Cervical spine MRI report 11/29/2020: " IMPRESSION:  1. Neurofibroma of the left C2 nerve as seen previously, maximal  transverse diameter 11-12 mm.  2. Previous ACDF C3-4. Canal stenosis remains at this level  with AP  diameter of 7 mm, without worsening. No actual cord compression.  3. Spondylosis and facet arthropathy with degenerative  anterolisthesis of C5-6 and C7-T1. Canal narrowing but no cord  compression.  4. Foraminal stenosis primarily due to facet osteophytes and  uncovertebral osteophytes that could cause neural compression  bilateral at C2-3, bilateral at C3-4, bilateral at C4-5, bilateral  but left more than right at C5-6, bilateral but left more than right  at C6-7 and bilateral but left more than right at C7-T1." Lumbar spine MRI report 11/29/2020: "IMPRESSION:  1. Degenerative disc disease and degenerative facet disease  throughout the lumbar region which could be associated with back  pain. Thoracolumbar scoliotic curvature.  2. L2-3: Moderate central canal stenosis. Bilateral lateral recess  and foraminal stenosis left worse than right. Neural compression  could occur at this level, particularly on the left.  3. L3-4: Bilateral lateral recess and foraminal stenosis, right  worse than left. Neural compression could occur on either side,  particularly on the right.  4. L4-5: Bilateral lateral recess and foraminal stenosis, right more  than left. Neural compression could occur on either side,  particularly the right.  5. L5-S1: Bilateral foraminal narrowing with some potential to  affect either exiting L5 nerve."    Patient Stated Goals he would like to walk "like normal."    Currently in Pain? No/denies    Pain Onset More than a month ago             TREATMENT:  Neuromuscular Re-education:to improve, balance, postural strength, muscle activation patterns, and stabilization strength required for functional activities(CGA - min A as needed for safety): - standing static balance:semi tandem stance eyes closed2x30 seconds, intermittent touch down, eyes closed narrow stance on compliant surface, 2x30 seconds.   Hurdle activities (6 inch):  - forward step and push back to backward step  and push back with intermittent UE support. 1x10 each side.  - side step and push back over hurdle, 1x20each side   - step up and over large dynadisc, leading with same leg, with U UE support, 1x5 each side. (stopped due to R ankle discomfort).   Therapeutic exercise:to centralize symptoms and improve ROM, strength, muscular endurance, and activity tolerance required for successful completion of functional activities. - standing fingers to toes 2x10.  - seated hamstring ball rolls with 3kg med ball with back unsupported, 1x40, 2x20each side - sit <> stand with 3kg med ball toss, 2x10  Pt required multimodal cuing for proper technique and to facilitate improved neuromuscular control, strength, range of motion, and functional ability resulting in improved performance and form.  HOME EXERCISE PROGRAM Access Code: [email or web address] URL: [email or web address] Date: 03/20/2021 Prepared by: Rosita Kea  Exercises Standing Romberg to 1/2 Tandem Stance - 1 x daily - 3 sets - 30 seconds hold Supine Lower Trunk Rotation - 1-2 x daily - 1 sets - 20 reps - 2-5 seconds hold  PT Education - 03/31/21 0945    Education Details exercise purpose/form    Person(s) Educated Patient    Methods Explanation;Demonstration;Tactile cues;Verbal cues    Comprehension Verbalized understanding;Returned demonstration;Verbal cues required;Tactile cues required;Need further instruction            PT Short Term Goals - 03/20/21 1527      PT SHORT TERM GOAL #1   Title Be independent with initial home exercise program for self-management of symptoms.    Baseline Initial HEP to be established at visit 2 as appropriate (03/13/2021); Initial HEP established visit 2 (03/18/2021);    Time 3    Period Weeks    Status Achieved    Target Date 04/03/21             PT Long Term Goals - 03/18/21 1209      PT LONG TERM GOAL #1   Title Be independent with a long-term home exercise program for  self-management of symptoms.    Baseline Initial HEP to be established at visit 2 as appropriate (03/13/2021); Initial HEP provided visit 2 (03/18/2021);    Time 12    Period Weeks    Status Partially Met   TARGET DATE FOR ALL LONG TERM GOALS: 06/05/2021     PT LONG TERM GOAL #2   Title Demonstrate improved FOTO score by 10 units to demonstrate improvement in overall condition and self-reported functional ability.    Baseline to be measured vsiti 2 as appropriate (03/13/2021); 51 (03/18/2021);    Time 12    Period Weeks    Status On-going      PT LONG TERM GOAL #3   Title Patient will score equal or greater than 25/30 on Functional Gait Assessment to demonstrate reduction in fall risk to low fall risk classification.    Baseline to be measured vsiti 2 as appropriate (03/13/2021); 20/30 moderate fall risk (03/18/2021);    Time 12    Period Weeks    Status New      PT LONG TERM GOAL #4   Title Patient will ambulate equal or greater than 1000 feet during 6 minute walk test to demonstrate improved community mobility.    Baseline to be measured vsiti 2 as appropriate (03/13/2021); 820 feet with SPC(03/18/2021);    Time 12    Period Weeks    Status On-going      PT LONG TERM GOAL #5   Title Complete community, work and/or recreational activities with 50% less limitation due to current condition.    Baseline Functional Limitations: difficulty with activities that require standing balance including outside activities, taking the puppy out, prolonged standing, household chores, community ambulation, grooming, climbing stairs, yard work, sleeping, fall risk, etc. cannot do what he wants to outside because he is afraid to fall (03/13/2021);    Time 12    Period Weeks    Status On-going                 Plan - 03/31/21 0944    Clinical Impression Statement Patient tolerated treatment well overall with continued complaint of low back pain with prolonged standing and increased LE clumsiness and  ataxia with fatigue. Does seem to get some relief from back pain with forward flexion in standing and is showing improved endurance and activity tolerance overall. Able to progress to more difficult balance exercises today. Does continue to report R ankle discomfort but is tolerating more complex activities involving stress to the R ankle. Patient would benefit from continued  management of limiting condition by skilled physical therapist to address remaining impairments and functional limitations to work towards stated goals and return to PLOF or maximal functional independence.    Personal Factors and Comorbidities Age;Comorbidity 3+;Past/Current Experience;Fitness;Time since onset of injury/illness/exacerbation    Comorbidities Relevant past medical history and comorbidities include seizure disorder (controlled with meds), hx R ankle fracture (wears an air brace to prevent pain for years), ACDF (05/2018 for myelopathy), R  Carpal tunnel release (06/2019), BPH, symptoms of peripheral neuropathy at all limbs (fine motor skills in B UE affected), CTS, perfuse degenerative changes on lumbar and cervical spine imaging (see chart for details).    Examination-Activity Limitations Bend;Locomotion Level;Stand;Carry;Sleep;Hygiene/Grooming;Caring for Others;Lift;Stairs;Squat    Examination-Participation Restrictions Shop;Community Activity;Meal Prep;Valla Leaver Work   activities that require standing balance including outside activities, taking the puppy out, prolonged standing, household chores, community ambulation, grooming, climbing stairs, yard work, sleeping, fall risk, etc.   Stability/Clinical Decision Making Evolving/Moderate complexity    Rehab Potential Fair    PT Frequency 2x / week    PT Duration 12 weeks    PT Treatment/Interventions ADLs/Self Care Home Management;Cryotherapy;Traction;Moist Heat;Electrical Stimulation;DME Instruction;Gait training;Stair training;Functional mobility training;Therapeutic  activities;Therapeutic exercise;Balance training;Neuromuscular re-education;Patient/family education;Passive range of motion;Dry needling;Manual techniques;Joint Manipulations;Spinal Manipulations    PT Next Visit Plan balance training, LE strength/power    PT Home Exercise Plan Medbridge Access Code: [email or web address]    Consulted and Agree with Plan of Care Patient           Patient will benefit from skilled therapeutic intervention in order to improve the following deficits and impairments:  Abnormal gait,Decreased knowledge of use of DME,Impaired sensation,Improper body mechanics,Pain,Decreased coordination,Decreased mobility,Postural dysfunction,Decreased activity tolerance,Decreased endurance,Decreased range of motion,Decreased strength,Hypomobility,Impaired perceived functional ability,Impaired UE functional use,Decreased balance,Difficulty walking,Impaired flexibility  Visit Diagnosis: Unsteadiness on feet  Chronic bilateral low back pain, unspecified whether sciatica present  Difficulty in walking, not elsewhere classified  History of falling     Problem List Patient Active Problem List   Diagnosis Date Noted  . Hearing loss due to cerumen impaction 03/01/2021  . Atypical chest pain 07/10/2020  . 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 Unsteadiness on feet 11/08/2019  . History of spinal surgery 01/17/2019  . Neck pain 01/17/2019  . Weakness of hand 01/17/2019  . Benign prostatic hyperplasia with urinary obstruction 01/17/2019  . Systolic murmur 49/82/6415  . Right carpal tunnel syndrome 02/02/2018  . Ulnar neuropathy at elbow, left 02/02/2018  . Peripheral neuropathy 01/26/2018  . Bilateral buttock pain 06/29/2016  . Failed vision screen 06/29/2016  . Advanced care planning/counseling discussion 06/21/2015  . Medicare annual wellness visit, subsequent 06/18/2014  . Health maintenance examination 06/18/2014  . Chronic ankle pain 06/18/2014  . Incomplete emptying of bladder  06/07/2013  . Vitamin D deficiency 05/20/2013  . THYROID NODULE 05/17/2007  . HLD (hyperlipidemia) 05/17/2007  . TREMOR, ESSENTIAL 05/17/2007  . Essential hypertension 05/17/2007  . Cervical spondylosis with myelopathy and radiculopathy 05/17/2007  . Seizure disorder ([place]) 05/17/2007    Everlean Alstrom. Graylon Good, PT, DPT 03/31/21, 9:48 AM  [facility] [address], [place], [address] Phone: [phone]   Fax:  [phone]  Name: LANGDON CROSSON MRN: [id number] Date of Birth: 05/17/1935

## 2021-04-02 ENCOUNTER — Ambulatory Visit: Payer: Medicare Other | Admitting: Physical Therapy

## 2021-04-02 ENCOUNTER — Other Ambulatory Visit: Payer: Self-pay

## 2021-04-02 ENCOUNTER — Encounter: Payer: Self-pay | Admitting: Physical Therapy

## 2021-04-02 DIAGNOSIS — R2681 Unsteadiness on feet: Secondary | ICD-10-CM

## 2021-04-02 DIAGNOSIS — M545 Low back pain, unspecified: Secondary | ICD-10-CM

## 2021-04-02 DIAGNOSIS — R262 Difficulty in walking, not elsewhere classified: Secondary | ICD-10-CM

## 2021-04-02 DIAGNOSIS — G8929 Other chronic pain: Secondary | ICD-10-CM

## 2021-04-02 DIAGNOSIS — Z9181 History of falling: Secondary | ICD-10-CM

## 2021-04-02 NOTE — Therapy (Signed)
[facility] [address], [place], [address] Phone: [phone]   Fax:  [phone]  Physical Therapy Treatment  Patient Details  Name: Mark Holmes MRN: [id number] Date of Birth: 1935/04/11 Referring Provider (PT): Allene Dillon, NP   Encounter Date: 04/02/2021   PT End of Session - 04/02/21 1506    Visit Number 7    Number of Visits 24    Date for PT Re-Evaluation 06/05/21    Authorization Type [employer] MEDICARE reporting period from 03/13/2021    Progress Note Due on Visit 10    PT Start Time 0900    PT Stop Time 0940    PT Time Calculation (min) 40 min    Equipment Utilized During Treatment Gait belt    Activity Tolerance Patient tolerated treatment well    Behavior During Therapy [facility] for tasks assessed/performed   verbose          Past Medical History:  Diagnosis Date  . Benign prostatic hypertrophy with elevated PSA    per Dr. Jacqlyn Larsen  . History of CT scan of brain 1992   normal  . Hyperglycemia 01/2006   102  . Hyperlipemia   . Hypertension   . Seizure disorder [facility])     Past Surgical History:  Procedure Laterality Date  . ANTERIOR CERVICAL DECOMP/DISCECTOMY FUSION  05/2018   for myelopathy -C3/4 ([facility])  . CARPAL TUNNEL RELEASE Right 06/2019   ([facility])  . CATARACT EXTRACTION W/ INTRAOCULAR LENS IMPLANT Right 2012   [facility]  . History of EEG  1985   normal  . hospitalization  1981   observation for seizuare  . TONSILLECTOMY    . TRANSURETHRAL RESECTION OF PROSTATE  10/2005   [name]  . TRANSURETHRAL RESECTION OF PROSTATE  08/2017   (rpt) [facility]    There were no vitals filed for this visit.   Subjective Assessment - 04/02/21 1505    Subjective Patient reports he continues to have intermittant back and lower leg pain/paresthesia. Feels good today and is ready to participate in PT.    Pertinent History Patient is a 86 y.o. male who presents to outpatient  physical therapy with a referral for medical diagnosis difficulty balancing, lumbar DDD (degenerative disc disease) with request for balance therapy. This patient's chief complaints consist of discomfort of the B LE below the knee, pain in the low back with standing, and difficulty balancing in the setting of chronic R ankle pain, B hand neuropathy symptoms and history of cervical spine disorder with ACDF at C3-4 for myelopathy leading to the following functional deficits: cannot do what he wants to outside because he is afraid to fall, taking the puppy out, prolonged standing, household chores, community ambulation, grooming, climbing stairs, yard work, sleeping, fall risk, etc. .  Relevant past medical history and comorbidities include seizure disorder (controlled with meds), hx R ankle fracture (wears an air brace to prevent pain for years), ACDF (05/2018 for myelopathy), R  Carpal tunnel release (06/2019), BPH, symptoms of peripheral neuropathy at all limbs (fine motor skills in B UE affected), CTS, perfuse degenerative changes on lumbar and cervical spine imaging (see chart for details).    Limitations Standing;Walking;Sitting;Other (comment);House hold activities   Functional Limitations: cannot do what he wants to outside because he is afraid to fall, taking the puppy out, prolonged standing, household chores, community ambulation, grooming, climbing stairs, yard work, sleeping.   How long can you sit comfortably? "a long  time' (but will feelit when he stands up)    How long can you stand comfortably? can finish the task but gets really uncomfortable in the back    Diagnostic tests Cervical spine MRI report 11/29/2020: " IMPRESSION:  1. Neurofibroma of the left C2 nerve as seen previously, maximal  transverse diameter 11-12 mm.  2. Previous ACDF C3-4. Canal stenosis remains at this level with AP  diameter of 7 mm, without worsening. No actual cord compression.  3. Spondylosis and facet arthropathy with  degenerative  anterolisthesis of C5-6 and C7-T1. Canal narrowing but no cord  compression.  4. Foraminal stenosis primarily due to facet osteophytes and  uncovertebral osteophytes that could cause neural compression  bilateral at C2-3, bilateral at C3-4, bilateral at C4-5, bilateral  but left more than right at C5-6, bilateral but left more than right  at C6-7 and bilateral but left more than right at C7-T1." Lumbar spine MRI report 11/29/2020: "IMPRESSION:  1. Degenerative disc disease and degenerative facet disease  throughout the lumbar region which could be associated with back  pain. Thoracolumbar scoliotic curvature.  2. L2-3: Moderate central canal stenosis. Bilateral lateral recess  and foraminal stenosis left worse than right. Neural compression  could occur at this level, particularly on the left.  3. L3-4: Bilateral lateral recess and foraminal stenosis, right  worse than left. Neural compression could occur on either side,  particularly on the right.  4. L4-5: Bilateral lateral recess and foraminal stenosis, right more  than left. Neural compression could occur on either side,  particularly the right.  5. L5-S1: Bilateral foraminal narrowing with some potential to  affect either exiting L5 nerve."    Patient Stated Goals he would like to walk "like normal."    Currently in Pain? No/denies    Pain Onset More than a month ago             TREATMENT:  Neuromuscular Re-education:to improve, balance, postural strength, muscle activation patterns, and stabilization strength required for functional activities(CGA - min A as needed for safety): - standing static balance:semi tandem stance eyes closed2x30 seconds, intermittent touch down, eyes open narrow stance on compliant surface, rotating head back and forth horizontally, 1x20. Forward sway on airex with self selected stance, 1x20 (very hard to sway and not use hip strategy).   Hurdle activities (6 inch):  - forward step and push back  to backward step and push back with intermittent UE support. 1x10 each side.  - side step and push back over hurdle, 1x20each side   - step on/off airex pad with each foot (stradle/together) with no UE support, 1x10 leading with each foot.  - step up and over large dynadisc, leading with same leg, with U UE support, 1x5 each side. (VERY difficult, needed a seated break before he could continue).   Therapeutic exercise:to centralize symptoms and improve ROM, strength, muscular endurance, and activity tolerance required for successful completion of functional activities. - standing fingers to toes 1x10. - seated hamstring ball rolls with 3kg med ball with back unsupported, 2x20each side  - sit <> stand with 3kg med ball toss,2x10  Pt required multimodal cuing for proper technique and to facilitate improved neuromuscular control, strength, range of motion, and functional ability resulting in improved performance and form.  HOME EXERCISE PROGRAM Access Code: [email or web address] URL: [email or web address] Date: 03/20/2021 Prepared by: Rosita Kea  Exercises Standing Romberg to 1/2 Tandem Stance - 1 x daily - 3 sets - 30 seconds hold Supine Lower Trunk  Rotation - 1-2 x daily - 1 sets - 20 reps - 2-5 seconds hold    PT Education - 04/02/21 1506    Education Details exercise purpose/form    Person(s) Educated Patient    Methods Explanation;Demonstration;Tactile cues;Verbal cues    Comprehension Verbalized understanding;Returned demonstration;Verbal cues required;Tactile cues required;Need further instruction            PT Short Term Goals - 03/20/21 1527      PT SHORT TERM GOAL #1   Title Be independent with initial home exercise program for self-management of symptoms.    Baseline Initial HEP to be established at visit 2 as appropriate (03/13/2021); Initial HEP established visit 2 (03/18/2021);    Time 3    Period Weeks    Status Achieved    Target Date 04/03/21              PT Long Term Goals - 03/18/21 1209      PT LONG TERM GOAL #1   Title Be independent with a long-term home exercise program for self-management of symptoms.    Baseline Initial HEP to be established at visit 2 as appropriate (03/13/2021); Initial HEP provided visit 2 (03/18/2021);    Time 12    Period Weeks    Status Partially Met   TARGET DATE FOR ALL LONG TERM GOALS: 06/05/2021     PT LONG TERM GOAL #2   Title Demonstrate improved FOTO score by 10 units to demonstrate improvement in overall condition and self-reported functional ability.    Baseline to be measured vsiti 2 as appropriate (03/13/2021); 51 (03/18/2021);    Time 12    Period Weeks    Status On-going      PT LONG TERM GOAL #3   Title Patient will score equal or greater than 25/30 on Functional Gait Assessment to demonstrate reduction in fall risk to low fall risk classification.    Baseline to be measured vsiti 2 as appropriate (03/13/2021); 20/30 moderate fall risk (03/18/2021);    Time 12    Period Weeks    Status New      PT LONG TERM GOAL #4   Title Patient will ambulate equal or greater than 1000 feet during 6 minute walk test to demonstrate improved community mobility.    Baseline to be measured vsiti 2 as appropriate (03/13/2021); 820 feet with SPC(03/18/2021);    Time 12    Period Weeks    Status On-going      PT LONG TERM GOAL #5   Title Complete community, work and/or recreational activities with 50% less limitation due to current condition.    Baseline Functional Limitations: difficulty with activities that require standing balance including outside activities, taking the puppy out, prolonged standing, household chores, community ambulation, grooming, climbing stairs, yard work, sleeping, fall risk, etc. cannot do what he wants to outside because he is afraid to fall (03/13/2021);    Time 12    Period Weeks    Status On-going                 Plan - 04/02/21 1507    Clinical Impression  Statement Patient tolerated treatment well overall but had more trouble with step-over exercise with knees seeming like they were going to buckle. Better with rest. Patient would benefit from continued management of limiting condition by skilled physical therapist to address remaining impairments and functional limitations to work towards stated goals and return to PLOF or maximal functional independence.    Personal  Factors and Comorbidities Age;Comorbidity 3+;Past/Current Experience;Fitness;Time since onset of injury/illness/exacerbation    Comorbidities Relevant past medical history and comorbidities include seizure disorder (controlled with meds), hx R ankle fracture (wears an air brace to prevent pain for years), ACDF (05/2018 for myelopathy), R  Carpal tunnel release (06/2019), BPH, symptoms of peripheral neuropathy at all limbs (fine motor skills in B UE affected), CTS, perfuse degenerative changes on lumbar and cervical spine imaging (see chart for details).    Examination-Activity Limitations Bend;Locomotion Level;Stand;Carry;Sleep;Hygiene/Grooming;Caring for Others;Lift;Stairs;Squat    Examination-Participation Restrictions Shop;Community Activity;Meal Prep;Valla Leaver Work   activities that require standing balance including outside activities, taking the puppy out, prolonged standing, household chores, community ambulation, grooming, climbing stairs, yard work, sleeping, fall risk, etc.   Stability/Clinical Decision Making Evolving/Moderate complexity    Rehab Potential Fair    PT Frequency 2x / week    PT Duration 12 weeks    PT Treatment/Interventions ADLs/Self Care Home Management;Cryotherapy;Traction;Moist Heat;Electrical Stimulation;DME Instruction;Gait training;Stair training;Functional mobility training;Therapeutic activities;Therapeutic exercise;Balance training;Neuromuscular re-education;Patient/family education;Passive range of motion;Dry needling;Manual techniques;Joint Manipulations;Spinal  Manipulations    PT Next Visit Plan balance training, LE strength/power    PT Home Exercise Plan Medbridge Access Code: [email or web address]    Consulted and Agree with Plan of Care Patient           Patient will benefit from skilled therapeutic intervention in order to improve the following deficits and impairments:  Abnormal gait,Decreased knowledge of use of DME,Impaired sensation,Improper body mechanics,Pain,Decreased coordination,Decreased mobility,Postural dysfunction,Decreased activity tolerance,Decreased endurance,Decreased range of motion,Decreased strength,Hypomobility,Impaired perceived functional ability,Impaired UE functional use,Decreased balance,Difficulty walking,Impaired flexibility  Visit Diagnosis: Unsteadiness on feet  Chronic bilateral low back pain, unspecified whether sciatica present  Difficulty in walking, not elsewhere classified  History of falling     Problem List Patient Active Problem List   Diagnosis Date Noted  . Hearing loss due to cerumen impaction 03/01/2021  . Atypical chest pain 07/10/2020  . 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 Unsteadiness on feet 11/08/2019  . History of spinal surgery 01/17/2019  . Neck pain 01/17/2019  . Weakness of hand 01/17/2019  . Benign prostatic hyperplasia with urinary obstruction 01/17/2019  . Systolic murmur 32/54/9826  . Right carpal tunnel syndrome 02/02/2018  . Ulnar neuropathy at elbow, left 02/02/2018  . Peripheral neuropathy 01/26/2018  . Bilateral buttock pain 06/29/2016  . Failed vision screen 06/29/2016  . Advanced care planning/counseling discussion 06/21/2015  . Medicare annual wellness visit, subsequent 06/18/2014  . Health maintenance examination 06/18/2014  . Chronic ankle pain 06/18/2014  . Incomplete emptying of bladder 06/07/2013  . Vitamin D deficiency 05/20/2013  . THYROID NODULE 05/17/2007  . HLD (hyperlipidemia) 05/17/2007  . TREMOR, ESSENTIAL 05/17/2007  . Essential hypertension 05/17/2007  .  Cervical spondylosis with myelopathy and radiculopathy 05/17/2007  . Seizure disorder ([place]) 05/17/2007    Everlean Alstrom. Graylon Good, PT, DPT 04/02/21, 3:08 PM  [facility] [address] [place], [address] Phone: [phone]   Fax:  [phone]  Name: BABYBOY LOYA MRN: [id number] Date of Birth: January 05, 1935

## 2021-04-07 ENCOUNTER — Other Ambulatory Visit: Payer: Self-pay

## 2021-04-07 ENCOUNTER — Encounter: Payer: Self-pay | Admitting: Physical Therapy

## 2021-04-07 ENCOUNTER — Ambulatory Visit: Payer: Medicare Other | Admitting: Physical Therapy

## 2021-04-07 DIAGNOSIS — M545 Low back pain, unspecified: Secondary | ICD-10-CM

## 2021-04-07 DIAGNOSIS — R2681 Unsteadiness on feet: Secondary | ICD-10-CM | POA: Diagnosis not present

## 2021-04-07 DIAGNOSIS — Z9181 History of falling: Secondary | ICD-10-CM

## 2021-04-07 DIAGNOSIS — R262 Difficulty in walking, not elsewhere classified: Secondary | ICD-10-CM

## 2021-04-07 NOTE — Therapy (Signed)
[facility] [address] [place], [address] Phone: [phone]   Fax:  [phone]  Physical Therapy Treatment  Patient Details  Name: Mark Holmes MRN: [id number] Date of Birth: 1935/04/19 Referring Provider (PT): Allene Dillon, NP   Encounter Date: 04/07/2021   PT End of Session - 04/07/21 0900    Visit Number 8    Number of Visits 24    Date for PT Re-Evaluation 06/05/21    Authorization Type [employer] MEDICARE reporting period from 03/13/2021    Progress Note Due on Visit 10    PT Start Time 0901    PT Stop Time 0943    PT Time Calculation (min) 42 min    Equipment Utilized During Treatment Gait belt    Activity Tolerance Patient tolerated treatment well    Behavior During Therapy [facility] for tasks assessed/performed   verbose          Past Medical History:  Diagnosis Date  . Benign prostatic hypertrophy with elevated PSA    per Dr. Jacqlyn Larsen  . History of CT scan of brain 1992   normal  . Hyperglycemia 01/2006   102  . Hyperlipemia   . Hypertension   . Seizure disorder [facility])     Past Surgical History:  Procedure Laterality Date  . ANTERIOR CERVICAL DECOMP/DISCECTOMY FUSION  05/2018   for myelopathy -C3/4 ([facility])  . CARPAL TUNNEL RELEASE Right 06/2019   ([facility])  . CATARACT EXTRACTION W/ INTRAOCULAR LENS IMPLANT Right 2012   [facility]  . History of EEG  1985   normal  . hospitalization  1981   observation for seizuare  . TONSILLECTOMY    . TRANSURETHRAL RESECTION OF PROSTATE  10/2005   [name]  . TRANSURETHRAL RESECTION OF PROSTATE  08/2017   (rpt) [facility]    There were no vitals filed for this visit.   Subjective Assessment - 04/07/21 0902    Subjective This morning, both legs still feel weak. Seems like he can walk it off. Slight back pain but nothing he would complain about. Balance, made it to church yesterday. Just careful when walking. States he did better  walking on uneven ground than before when walking outside at church.    Pertinent History Patient is a 86 y.o. male who presents to outpatient physical therapy with a referral for medical diagnosis difficulty balancing, lumbar DDD (degenerative disc disease) with request for balance therapy. This patient's chief complaints consist of discomfort of the B LE below the knee, pain in the low back with standing, and difficulty balancing in the setting of chronic R ankle pain, B hand neuropathy symptoms and history of cervical spine disorder with ACDF at C3-4 for myelopathy leading to the following functional deficits: cannot do what he wants to outside because he is afraid to fall, taking the puppy out, prolonged standing, household chores, community ambulation, grooming, climbing stairs, yard work, sleeping, fall risk, etc. .  Relevant past medical history and comorbidities include seizure disorder (controlled with meds), hx R ankle fracture (wears an air brace to prevent pain for years), ACDF (05/2018 for myelopathy), R  Carpal tunnel release (06/2019), BPH, symptoms of peripheral neuropathy at all limbs (fine motor skills in B UE affected), CTS, perfuse degenerative changes on lumbar and cervical spine imaging (see chart for details).    Limitations Standing;Walking;Sitting;Other (comment);House hold activities   Functional Limitations: cannot do what he wants to outside because he is afraid to fall,  taking the puppy out, prolonged standing, household chores, community ambulation, grooming, climbing stairs, yard work, sleeping.   How long can you sit comfortably? "a long time' (but will feelit when he stands up)    How long can you stand comfortably? can finish the task but gets really uncomfortable in the back    Diagnostic tests Cervical spine MRI report 11/29/2020: " IMPRESSION:  1. Neurofibroma of the left C2 nerve as seen previously, maximal  transverse diameter 11-12 mm.  2. Previous ACDF C3-4. Canal stenosis  remains at this level with AP  diameter of 7 mm, without worsening. No actual cord compression.  3. Spondylosis and facet arthropathy with degenerative  anterolisthesis of C5-6 and C7-T1. Canal narrowing but no cord  compression.  4. Foraminal stenosis primarily due to facet osteophytes and  uncovertebral osteophytes that could cause neural compression  bilateral at C2-3, bilateral at C3-4, bilateral at C4-5, bilateral  but left more than right at C5-6, bilateral but left more than right  at C6-7 and bilateral but left more than right at C7-T1." Lumbar spine MRI report 11/29/2020: "IMPRESSION:  1. Degenerative disc disease and degenerative facet disease  throughout the lumbar region which could be associated with back  pain. Thoracolumbar scoliotic curvature.  2. L2-3: Moderate central canal stenosis. Bilateral lateral recess  and foraminal stenosis left worse than right. Neural compression  could occur at this level, particularly on the left.  3. L3-4: Bilateral lateral recess and foraminal stenosis, right  worse than left. Neural compression could occur on either side,  particularly on the right.  4. L4-5: Bilateral lateral recess and foraminal stenosis, right more  than left. Neural compression could occur on either side,  particularly the right.  5. L5-S1: Bilateral foraminal narrowing with some potential to  affect either exiting L5 nerve."    Patient Stated Goals he would like to walk "like normal."    Currently in Pain? No/denies    Pain Onset More than a month ago           TREATMENT:  Neuromuscular Re-education:to improve, balance, postural strength, muscle activation patterns, and stabilization strength required for functional activities(CGA - min A as needed for safety): - standing static balance:semi tandemstance eyes closed2x30 seconds, intermittent touch down, eyes open narrow stance on compliant surface, rotating head back and forth horizontally, 1x20. Forward sway on airex with  self selected stance, 1x20    Hurdle activities (6 inch):  - forward step and push back to backward step and push back with intermittent UE support. 1x10 each side.  - side step and push back over hurdle, 1x20each side   - step on/off airex pad with each foot (stradle/together) with no UE support, 1x10 leading with each foot. -agility ladder drills: 2x leading each foot forward/backwards pattern. With CGA - min A. (fast pace second set)  Therapeutic exercise:to centralize symptoms and improve ROM, strength, muscular endurance, and activity tolerance required for successful completion of functional activities. - standing lumbar extension1x10. - seated hamstring ball rolls with 3kg med ball with back unsupported, 2x20each side - sit <> stand with 3kg med ball toss,3x12  Pt required multimodal cuing for proper technique and to facilitate improved neuromuscular control, strength, range of motion, and functional ability resulting in improved performance and form.  HOME EXERCISE PROGRAM Access Code: [email or web address] URL: [email or web address] Date: 03/20/2021 Prepared by: Rosita Kea  Exercises Standing Romberg to 1/2 Tandem Stance - 1 x daily - 3 sets - 30 seconds hold Supine Lower Trunk  Rotation - 1-2 x daily - 1 sets - 20 reps - 2-5 seconds hold   PT Education - 04/07/21 1149    Education Details exercise purpose/form    Person(s) Educated Patient    Methods Explanation;Demonstration;Tactile cues;Verbal cues    Comprehension Verbalized understanding;Returned demonstration;Verbal cues required;Tactile cues required;Need further instruction                PT Short Term Goals - 03/20/21 1527      PT SHORT TERM GOAL #1   Title Be independent with initial home exercise program for self-management of symptoms.    Baseline Initial HEP to be established at visit 2 as appropriate (03/13/2021); Initial HEP established visit 2 (03/18/2021);    Time 3    Period  Weeks    Status Achieved    Target Date 04/03/21             PT Long Term Goals - 03/18/21 1209      PT LONG TERM GOAL #1   Title Be independent with a long-term home exercise program for self-management of symptoms.    Baseline Initial HEP to be established at visit 2 as appropriate (03/13/2021); Initial HEP provided visit 2 (03/18/2021);    Time 12    Period Weeks    Status Partially Met   TARGET DATE FOR ALL LONG TERM GOALS: 06/05/2021     PT LONG TERM GOAL #2   Title Demonstrate improved FOTO score by 10 units to demonstrate improvement in overall condition and self-reported functional ability.    Baseline to be measured vsiti 2 as appropriate (03/13/2021); 51 (03/18/2021);    Time 12    Period Weeks    Status On-going      PT LONG TERM GOAL #3   Title Patient will score equal or greater than 25/30 on Functional Gait Assessment to demonstrate reduction in fall risk to low fall risk classification.    Baseline to be measured vsiti 2 as appropriate (03/13/2021); 20/30 moderate fall risk (03/18/2021);    Time 12    Period Weeks    Status New      PT LONG TERM GOAL #4   Title Patient will ambulate equal or greater than 1000 feet during 6 minute walk test to demonstrate improved community mobility.    Baseline to be measured vsiti 2 as appropriate (03/13/2021); 820 feet with SPC(03/18/2021);    Time 12    Period Weeks    Status On-going      PT LONG TERM GOAL #5   Title Complete community, work and/or recreational activities with 50% less limitation due to current condition.    Baseline Functional Limitations: difficulty with activities that require standing balance including outside activities, taking the puppy out, prolonged standing, household chores, community ambulation, grooming, climbing stairs, yard work, sleeping, fall risk, etc. cannot do what he wants to outside because he is afraid to fall (03/13/2021);    Time 12    Period Weeks    Status On-going                  Plan - 04/07/21 1148    Clinical Impression Statement Patient tolerated treatment well overall with some complaint of discomfort at lower back and right ankle that was accommodated with rest or exercise modification. Patient continues to demonstrate ataxia and increased LE clumsiness/imbalance with fatigue and prolonged standing but does appear to have better control when fatigued than when he started PT. Patient would benefit from continued management  of limiting condition by skilled physical therapist to address remaining impairments and functional limitations to work towards stated goals and return to PLOF or maximal functional independence.    Personal Factors and Comorbidities Age;Comorbidity 3+;Past/Current Experience;Fitness;Time since onset of injury/illness/exacerbation    Comorbidities Relevant past medical history and comorbidities include seizure disorder (controlled with meds), hx R ankle fracture (wears an air brace to prevent pain for years), ACDF (05/2018 for myelopathy), R  Carpal tunnel release (06/2019), BPH, symptoms of peripheral neuropathy at all limbs (fine motor skills in B UE affected), CTS, perfuse degenerative changes on lumbar and cervical spine imaging (see chart for details).    Examination-Activity Limitations Bend;Locomotion Level;Stand;Carry;Sleep;Hygiene/Grooming;Caring for Others;Lift;Stairs;Squat    Examination-Participation Restrictions Shop;Community Activity;Meal Prep;Valla Leaver Work   activities that require standing balance including outside activities, taking the puppy out, prolonged standing, household chores, community ambulation, grooming, climbing stairs, yard work, sleeping, fall risk, etc.   Stability/Clinical Decision Making Evolving/Moderate complexity    Rehab Potential Fair    PT Frequency 2x / week    PT Duration 12 weeks    PT Treatment/Interventions ADLs/Self Care Home Management;Cryotherapy;Traction;Moist Heat;Electrical Stimulation;DME  Instruction;Gait training;Stair training;Functional mobility training;Therapeutic activities;Therapeutic exercise;Balance training;Neuromuscular re-education;Patient/family education;Passive range of motion;Dry needling;Manual techniques;Joint Manipulations;Spinal Manipulations    PT Next Visit Plan balance training, LE strength/power    PT Home Exercise Plan Medbridge Access Code: [email or web address]    Consulted and Agree with Plan of Care Patient           Patient will benefit from skilled therapeutic intervention in order to improve the following deficits and impairments:  Abnormal gait,Decreased knowledge of use of DME,Impaired sensation,Improper body mechanics,Pain,Decreased coordination,Decreased mobility,Postural dysfunction,Decreased activity tolerance,Decreased endurance,Decreased range of motion,Decreased strength,Hypomobility,Impaired perceived functional ability,Impaired UE functional use,Decreased balance,Difficulty walking,Impaired flexibility  Visit Diagnosis: Unsteadiness on feet  Chronic bilateral low back pain, unspecified whether sciatica present  Difficulty in walking, not elsewhere classified  History of falling     Problem List Patient Active Problem List   Diagnosis Date Noted  . Hearing loss due to cerumen impaction 03/01/2021  . Atypical chest pain 07/10/2020  . 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 Unsteadiness on feet 11/08/2019  . History of spinal surgery 01/17/2019  . Neck pain 01/17/2019  . Weakness of hand 01/17/2019  . Benign prostatic hyperplasia with urinary obstruction 01/17/2019  . Systolic murmur 23/55/7322  . Right carpal tunnel syndrome 02/02/2018  . Ulnar neuropathy at elbow, left 02/02/2018  . Peripheral neuropathy 01/26/2018  . Bilateral buttock pain 06/29/2016  . Failed vision screen 06/29/2016  . Advanced care planning/counseling discussion 06/21/2015  . Medicare annual wellness visit, subsequent 06/18/2014  . Health maintenance examination  06/18/2014  . Chronic ankle pain 06/18/2014  . Incomplete emptying of bladder 06/07/2013  . Vitamin D deficiency 05/20/2013  . THYROID NODULE 05/17/2007  . HLD (hyperlipidemia) 05/17/2007  . TREMOR, ESSENTIAL 05/17/2007  . Essential hypertension 05/17/2007  . Cervical spondylosis with myelopathy and radiculopathy 05/17/2007  . Seizure disorder ([place]) 05/17/2007    Everlean Alstrom. Graylon Good, PT, DPT 04/07/21, 11:51 AM  [facility] [address] [place], [address] Phone: [phone]   Fax:  [phone]  Name: RUTHERFORD ALARIE MRN: [id number] Date of Birth: 02-19-1935

## 2021-04-10 ENCOUNTER — Other Ambulatory Visit: Payer: Self-pay

## 2021-04-10 ENCOUNTER — Encounter: Payer: Self-pay | Admitting: Physical Therapy

## 2021-04-10 ENCOUNTER — Ambulatory Visit: Payer: Medicare Other | Admitting: Physical Therapy

## 2021-04-10 DIAGNOSIS — Z9181 History of falling: Secondary | ICD-10-CM

## 2021-04-10 DIAGNOSIS — G8929 Other chronic pain: Secondary | ICD-10-CM

## 2021-04-10 DIAGNOSIS — R262 Difficulty in walking, not elsewhere classified: Secondary | ICD-10-CM

## 2021-04-10 DIAGNOSIS — R2681 Unsteadiness on feet: Secondary | ICD-10-CM | POA: Diagnosis not present

## 2021-04-10 DIAGNOSIS — M545 Low back pain, unspecified: Secondary | ICD-10-CM

## 2021-04-10 NOTE — Therapy (Signed)
[facility] [address], [place], [address] Phone: [phone]   Fax:  [phone]  Physical Therapy Treatment  Patient Details  Name: Mark Holmes MRN: [id number] Date of Birth: 1935/01/09 Referring Provider (PT): Allene Dillon, NP   Encounter Date: 04/10/2021   PT End of Session - 04/10/21 2024    Visit Number 9    Number of Visits 24    Date for PT Re-Evaluation 06/05/21    Authorization Type [employer] MEDICARE reporting period from 03/13/2021    Progress Note Due on Visit 10    PT Start Time 0900    PT Stop Time 0943    PT Time Calculation (min) 43 min    Equipment Utilized During Treatment Gait belt    Activity Tolerance Patient tolerated treatment well    Behavior During Therapy [facility] for tasks assessed/performed   verbose          Past Medical History:  Diagnosis Date  . Benign prostatic hypertrophy with elevated PSA    per Dr. Jacqlyn Larsen  . History of CT scan of brain 1992   normal  . Hyperglycemia 01/2006   102  . Hyperlipemia   . Hypertension   . Seizure disorder [facility])     Past Surgical History:  Procedure Laterality Date  . ANTERIOR CERVICAL DECOMP/DISCECTOMY FUSION  05/2018   for myelopathy -C3/4 ([facility])  . CARPAL TUNNEL RELEASE Right 06/2019   ([facility])  . CATARACT EXTRACTION W/ INTRAOCULAR LENS IMPLANT Right 2012   [facility]  . History of EEG  1985   normal  . hospitalization  1981   observation for seizuare  . TONSILLECTOMY    . TRANSURETHRAL RESECTION OF PROSTATE  10/2005   [name]  . TRANSURETHRAL RESECTION OF PROSTATE  08/2017   (rpt) [facility]    There were no vitals filed for this visit.   Subjective Assessment - 04/10/21 2021    Subjective Patient reports he feels well but continues to have pain in his lower legs after prolonged sitting and in the mornings. Also has been bothered by some pain in the left upper trap region that keeps him up at night but  feels better when laying on his back. States this has gotten worse again after finishing his Prednisone taper a few days ago. States he felt fine following last PT session. States he went for his nerve conduction study and the tech said his legs have seen better days. Said he met Dr. Manuella Ghazi and really liked him. Will be meeting with Dr. Manuella Ghazi to discuss results and treatment options in early May.    Pertinent History Patient is a 86 y.o. male who presents to outpatient physical therapy with a referral for medical diagnosis difficulty balancing, lumbar DDD (degenerative disc disease) with request for balance therapy. This patient's chief complaints consist of discomfort of the B LE below the knee, pain in the low back with standing, and difficulty balancing in the setting of chronic R ankle pain, B hand neuropathy symptoms and history of cervical spine disorder with ACDF at C3-4 for myelopathy leading to the following functional deficits: cannot do what he wants to outside because he is afraid to fall, taking the puppy out, prolonged standing, household chores, community ambulation, grooming, climbing stairs, yard work, sleeping, fall risk, etc. .  Relevant past medical history and comorbidities include seizure disorder (controlled with meds), hx R ankle fracture (wears an air brace to prevent pain for  years), ACDF (05/2018 for myelopathy), R  Carpal tunnel release (06/2019), BPH, symptoms of peripheral neuropathy at all limbs (fine motor skills in B UE affected), CTS, perfuse degenerative changes on lumbar and cervical spine imaging (see chart for details).    Limitations Standing;Walking;Sitting;Other (comment);House hold activities   Functional Limitations: cannot do what he wants to outside because he is afraid to fall, taking the puppy out, prolonged standing, household chores, community ambulation, grooming, climbing stairs, yard work, sleeping.   How long can you sit comfortably? "a long time' (but will feelit  when he stands up)    How long can you stand comfortably? can finish the task but gets really uncomfortable in the back    Diagnostic tests Cervical spine MRI report 11/29/2020: " IMPRESSION:  1. Neurofibroma of the left C2 nerve as seen previously, maximal  transverse diameter 11-12 mm.  2. Previous ACDF C3-4. Canal stenosis remains at this level with AP  diameter of 7 mm, without worsening. No actual cord compression.  3. Spondylosis and facet arthropathy with degenerative  anterolisthesis of C5-6 and C7-T1. Canal narrowing but no cord  compression.  4. Foraminal stenosis primarily due to facet osteophytes and  uncovertebral osteophytes that could cause neural compression  bilateral at C2-3, bilateral at C3-4, bilateral at C4-5, bilateral  but left more than right at C5-6, bilateral but left more than right  at C6-7 and bilateral but left more than right at C7-T1." Lumbar spine MRI report 11/29/2020: "IMPRESSION:  1. Degenerative disc disease and degenerative facet disease  throughout the lumbar region which could be associated with back  pain. Thoracolumbar scoliotic curvature.  2. L2-3: Moderate central canal stenosis. Bilateral lateral recess  and foraminal stenosis left worse than right. Neural compression  could occur at this level, particularly on the left.  3. L3-4: Bilateral lateral recess and foraminal stenosis, right  worse than left. Neural compression could occur on either side,  particularly on the right.  4. L4-5: Bilateral lateral recess and foraminal stenosis, right more  than left. Neural compression could occur on either side,  particularly the right.  5. L5-S1: Bilateral foraminal narrowing with some potential to  affect either exiting L5 nerve."    Patient Stated Goals he would like to walk "like normal."    Currently in Pain? Yes    Pain Score 2     Pain Location --   upper trap region   Pain Orientation Left    Pain Onset More than a month ago            TREATMENT:  Neuromuscular Re-education:to improve, balance, postural strength, muscle activation patterns, and stabilization strength required for functional activities(CGA - min A as needed for safety): - standing static balance: eyesopensemi-narrow stance on compliant surface, rotating head back and forth horizontally, 2x20.  Forward sway on airex with self selected stance, 1x20   Hurdle activities (6 inch):  - forward step and push back to backward step and push back with intermittent UE support. 1x10 each side.  - side step and push back over hurdle, 1x20each side   - step on/off airex pad with each foot (stradle/together) with no UE support, 1x10 leading with each foot.  -agility ladder drills: 2x leading each foot forward/backwards pattern and side to side pattern With CGA - min A. (fast pace/to music beat second set)  Therapeutic exercise:to centralize symptoms and improve ROM, strength, muscular endurance, and activity tolerance required for successful completion of functional activities.  - seated hamstring  ball rolls with 3kg med ball with back unsupported,2x20each side - sit <> stand with 3kg med ball toss,3x14  - bouncing on small trampoline (not leaving contact with surface), 3x30 seconds with UE support (discomfort in R ankle but wants to complete).   Pt required multimodal cuing for proper technique and to facilitate improved neuromuscular control, strength, range of motion, and functional ability resulting in improved performance and form.  HOME EXERCISE PROGRAM Access Code: [email or web address] URL: [email or web address] Date: 03/20/2021 Prepared by: Rosita Kea  Exercises Standing Romberg to 1/2 Tandem Stance - 1 x daily - 3 sets - 30 seconds hold Supine Lower Trunk Rotation - 1-2 x daily - 1 sets - 20 reps - 2-5 seconds hold    PT Education - 04/10/21 2022    Education Details exercise purpose/form    Person(s) Educated Patient     Methods Explanation;Demonstration;Tactile cues;Verbal cues    Comprehension Verbalized understanding;Returned demonstration;Verbal cues required;Tactile cues required;Need further instruction            PT Short Term Goals - 03/20/21 1527      PT SHORT TERM GOAL #1   Title Be independent with initial home exercise program for self-management of symptoms.    Baseline Initial HEP to be established at visit 2 as appropriate (03/13/2021); Initial HEP established visit 2 (03/18/2021);    Time 3    Period Weeks    Status Achieved    Target Date 04/03/21             PT Long Term Goals - 03/18/21 1209      PT LONG TERM GOAL #1   Title Be independent with a long-term home exercise program for self-management of symptoms.    Baseline Initial HEP to be established at visit 2 as appropriate (03/13/2021); Initial HEP provided visit 2 (03/18/2021);    Time 12    Period Weeks    Status Partially Met   TARGET DATE FOR ALL LONG TERM GOALS: 06/05/2021     PT LONG TERM GOAL #2   Title Demonstrate improved FOTO score by 10 units to demonstrate improvement in overall condition and self-reported functional ability.    Baseline to be measured vsiti 2 as appropriate (03/13/2021); 51 (03/18/2021);    Time 12    Period Weeks    Status On-going      PT LONG TERM GOAL #3   Title Patient will score equal or greater than 25/30 on Functional Gait Assessment to demonstrate reduction in fall risk to low fall risk classification.    Baseline to be measured vsiti 2 as appropriate (03/13/2021); 20/30 moderate fall risk (03/18/2021);    Time 12    Period Weeks    Status New      PT LONG TERM GOAL #4   Title Patient will ambulate equal or greater than 1000 feet during 6 minute walk test to demonstrate improved community mobility.    Baseline to be measured vsiti 2 as appropriate (03/13/2021); 820 feet with SPC(03/18/2021);    Time 12    Period Weeks    Status On-going      PT LONG TERM GOAL #5   Title  Complete community, work and/or recreational activities with 50% less limitation due to current condition.    Baseline Functional Limitations: difficulty with activities that require standing balance including outside activities, taking the puppy out, prolonged standing, household chores, community ambulation, grooming, climbing stairs, yard work, sleeping, fall risk, etc. cannot do what he wants to outside because  he is afraid to fall (03/13/2021);    Time 12    Period Weeks    Status On-going                 Plan - 04/10/21 2028    Clinical Impression Statement Patient tolerated treatment well overall and is demonstrating improved coordination and and balance during exercises. Complains of feeling weak in the legs less often and does not tire as easily. Still has difficulty with ataxic gait, R ankle pain, and impaired balance that affects his functional ability. Patient would benefit from continued management of limiting condition by skilled physical therapist to address remaining impairments and functional limitations to work towards stated goals and return to PLOF or maximal functional independence.    Personal Factors and Comorbidities Age;Comorbidity 3+;Past/Current Experience;Fitness;Time since onset of injury/illness/exacerbation    Comorbidities Relevant past medical history and comorbidities include seizure disorder (controlled with meds), hx R ankle fracture (wears an air brace to prevent pain for years), ACDF (05/2018 for myelopathy), R  Carpal tunnel release (06/2019), BPH, symptoms of peripheral neuropathy at all limbs (fine motor skills in B UE affected), CTS, perfuse degenerative changes on lumbar and cervical spine imaging (see chart for details).    Examination-Activity Limitations Bend;Locomotion Level;Stand;Carry;Sleep;Hygiene/Grooming;Caring for Others;Lift;Stairs;Squat    Examination-Participation Restrictions Shop;Community Activity;Meal Prep;[employer]aver Work   activities that  require standing balance including outside activities, taking the puppy out, prolonged standing, household chores, community ambulation, grooming, climbing stairs, yard work, sleeping, fall risk, etc.   Stability/Clinical Decision Making Evolving/Moderate complexity    Rehab Potential Fair    PT Frequency 2x / week    PT Duration 12 weeks    PT Treatment/Interventions ADLs/Self Care Home Management;Cryotherapy;Traction;Moist Heat;Electrical Stimulation;DME Instruction;Gait training;Stair training;Functional mobility training;Therapeutic activities;Therapeutic exercise;Balance training;Neuromuscular re-education;Patient/family education;Passive range of motion;Dry needling;Manual techniques;Joint Manipulations;Spinal Manipulations    PT Next Visit Plan balance training, LE strength/power    PT Home Exercise Plan Medbridge Access Code: [email or web address]    Consulted and Agree with Plan of Care Patient           Patient will benefit from skilled therapeutic intervention in order to improve the following deficits and impairments:  Abnormal gait,Decreased knowledge of use of DME,Impaired sensation,Improper body mechanics,Pain,Decreased coordination,Decreased mobility,Postural dysfunction,Decreased activity tolerance,Decreased endurance,Decreased range of motion,Decreased strength,Hypomobility,Impaired perceived functional ability,Impaired UE functional use,Decreased balance,Difficulty walking,Impaired flexibility  Visit Diagnosis: Unsteadiness on feet  Chronic bilateral low back pain, unspecified whether sciatica present  Difficulty in walking, not elsewhere classified  History of falling     Problem List Patient Active Problem List   Diagnosis Date Noted  . Hearing loss due to cerumen impaction 03/01/2021  . Atypical chest pain 07/10/2020  . 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 Unsteadiness on feet 11/08/2019  . History of spinal surgery 01/17/2019  . Neck pain 01/17/2019  . Weakness of hand  01/17/2019  . Benign prostatic hyperplasia with urinary obstruction 01/17/2019  . Systolic murmur 40/09/2724  . Right carpal tunnel syndrome 02/02/2018  . Ulnar neuropathy at elbow, left 02/02/2018  . Peripheral neuropathy 01/26/2018  . Bilateral buttock pain 06/29/2016  . Failed vision screen 06/29/2016  . Advanced care planning/counseling discussion 06/21/2015  . Medicare annual wellness visit, subsequent 06/18/2014  . Health maintenance examination 06/18/2014  . Chronic ankle pain 06/18/2014  . Incomplete emptying of bladder 06/07/2013  . Vitamin D deficiency 05/20/2013  . THYROID NODULE 05/17/2007  . HLD (hyperlipidemia) 05/17/2007  . TREMOR, ESSENTIAL 05/17/2007  . Essential hypertension 05/17/2007  . Cervical spondylosis  with myelopathy and radiculopathy 05/17/2007  . Seizure disorder ([place]) 05/17/2007    Everlean Alstrom. Graylon Good, PT, DPT 04/10/21, 8:29 PM  [facility] [address] [place], [address] Phone: [phone]   Fax:  [phone]  Name: GAELEN BRAGER MRN: [id number] Date of Birth: 1935-07-11

## 2021-04-15 ENCOUNTER — Ambulatory Visit: Payer: Medicare Other

## 2021-04-15 ENCOUNTER — Other Ambulatory Visit: Payer: Self-pay

## 2021-04-15 DIAGNOSIS — M545 Low back pain, unspecified: Secondary | ICD-10-CM

## 2021-04-15 DIAGNOSIS — R2681 Unsteadiness on feet: Secondary | ICD-10-CM

## 2021-04-15 DIAGNOSIS — Z9181 History of falling: Secondary | ICD-10-CM

## 2021-04-15 DIAGNOSIS — R262 Difficulty in walking, not elsewhere classified: Secondary | ICD-10-CM

## 2021-04-15 NOTE — Therapy (Signed)
[facility] [address], [place], [address] Phone: [phone]   Fax:  [phone]  Physical Therapy Treatment Physical Therapy Progress Note   Dates of reporting period  03/13/21   to   04/15/21    Patient Details  Name: Mark Holmes MRN: [id number] Date of Birth: 1935-11-19 Referring Provider (PT): Allene Dillon, NP   Encounter Date: 04/15/2021   PT End of Session - 04/15/21 1014    Visit Number 10    Number of Visits 24    Date for PT Re-Evaluation 06/05/21    Authorization Type [employer] MEDICARE reporting period from 03/13/2021    Authorization Time Period 03/13/21-06/05/21    PT Start Time 0940    PT Stop Time 1020    PT Time Calculation (min) 40 min    Activity Tolerance Patient tolerated treatment well;Patient limited by fatigue;Patient limited by pain    Behavior During Therapy [facility] for tasks assessed/performed           Past Medical History:  Diagnosis Date  . Benign prostatic hypertrophy with elevated PSA    per Dr. Jacqlyn Larsen  . History of CT scan of brain 1992   normal  . Hyperglycemia 01/2006   102  . Hyperlipemia   . Hypertension   . Seizure disorder [facility])     Past Surgical History:  Procedure Laterality Date  . ANTERIOR CERVICAL DECOMP/DISCECTOMY FUSION  05/2018   for myelopathy -C3/4 ([facility])  . CARPAL TUNNEL RELEASE Right 06/2019   ([facility])  . CATARACT EXTRACTION W/ INTRAOCULAR LENS IMPLANT Right 2012   [place] [facility]  . History of EEG  1985   normal  . hospitalization  1981   observation for seizuare  . TONSILLECTOMY    . TRANSURETHRAL RESECTION OF PROSTATE  10/2005   [name]  . TRANSURETHRAL RESECTION OF PROSTATE  08/2017   (rpt) [facility]    There were no vitals filed for this visit.   Subjective Assessment - 04/15/21 0943    Subjective Pt doing well in general, but reports his neck pain with Left referal to left lateral neck, Left shoulder, Left  shoulder/scapula seems to be getting worse (some relief for a while after prednisone therapy in Feb). Reports HEP is going well, but he feels he should be doing more. Reports he still has numbness in his bialt calves upon waking.    Pertinent History Patient is a 86 y.o. male who presents to outpatient physical therapy with a referral for medical diagnosis difficulty balancing, lumbar DDD (degenerative disc disease) with request for balance therapy. This patient's chief complaints consist of discomfort of the B LE below the knee, pain in the low back with standing, and difficulty balancing in the setting of chronic R ankle pain, B hand neuropathy symptoms and history of cervical spine disorder with ACDF at C3-4 for myelopathy leading to the following functional deficits: cannot do what he wants to outside because he is afraid to fall, taking the puppy out, prolonged standing, household chores, community ambulation, grooming, climbing stairs, yard work, sleeping, fall risk, etc. .  Relevant past medical history and comorbidities include seizure disorder (controlled with meds), hx R ankle fracture (wears an air brace to prevent pain for years), ACDF (05/2018 for myelopathy), R  Carpal tunnel release (06/2019), BPH, symptoms of peripheral neuropathy at all limbs (fine motor skills in B UE affected), CTS, perfuse degenerative changes on lumbar and cervical spine imaging (see chart for details).  Limitations Standing;Walking;Sitting;Other (comment);House hold activities    How long can you sit comfortably? "a long time' (but will feelit when he stands up)    How long can you stand comfortably? can finish the task but gets really uncomfortable in the back    Diagnostic tests Cervical spine MRI report 11/29/2020: " IMPRESSION:  1. Neurofibroma of the left C2 nerve as seen previously, maximal  transverse diameter 11-12 mm.  2. Previous ACDF C3-4. Canal stenosis remains at this level with AP  diameter of 7 mm, without  worsening. No actual cord compression.  3. Spondylosis and facet arthropathy with degenerative  anterolisthesis of C5-6 and C7-T1. Canal narrowing but no cord  compression.  4. Foraminal stenosis primarily due to facet osteophytes and  uncovertebral osteophytes that could cause neural compression  bilateral at C2-3, bilateral at C3-4, bilateral at C4-5, bilateral  but left more than right at C5-6, bilateral but left more than right  at C6-7 and bilateral but left more than right at C7-T1." Lumbar spine MRI report 11/29/2020: "IMPRESSION:  1. Degenerative disc disease and degenerative facet disease  throughout the lumbar region which could be associated with back  pain. Thoracolumbar scoliotic curvature.  2. L2-3: Moderate central canal stenosis. Bilateral lateral recess  and foraminal stenosis left worse than right. Neural compression  could occur at this level, particularly on the left.  3. L3-4: Bilateral lateral recess and foraminal stenosis, right  worse than left. Neural compression could occur on either side,  particularly on the right.  4. L4-5: Bilateral lateral recess and foraminal stenosis, right more  than left. Neural compression could occur on either side,  particularly the right.  5. L5-S1: Bilateral foraminal narrowing with some potential to  affect either exiting L5 nerve."    Patient Stated Goals he would like to walk "like normal."    Currently in Pain? No/denies              [facility] PT Assessment - 04/15/21 0001      Observation/Other Assessments   Focus on Therapeutic Outcomes (FOTO)  51   51 at eval         6 Minute Walk Test: 1260f c progressive incresaed low back pain 4/10 and decreased subjective motor control of Right ankle.    Indicates pain 03/13/2021 04/15/2021 Date   Joint/Motion R/L R/L R/L   Shoulder         Hip         Flexion 4+/4+ 4 /4+ /   Extension (knee ext) / / /   Abduction / / /   Knee         Extension 5/5 / /   Flexion 4+/5 5 / 5 /   Ankle/Foot          Dorsiflexion  4+/5 5 / 5 /   Great toe extension 4+/4+ / /   Eversion 4+/4+ 4+/4+ /   Inversion  5 / 5 /   Plantarflexion 4+/4+ / /     TREATMENT:     Neuromuscular Re-education: to improve, balance, postural strength, muscle activation patterns, and stabilization strength required for functional activities (CGA - min A as needed for safety): - standing static balance: eyes open semi-narrow stance on compliant surface, rotating head back and forth horizontally, 2x20.  Forward sway on airex with self selected stance, 1x20     Hurdle activities (6 inch):  - forward step and push back to backward step and push back with intermittent UE support.  1x10 each side.  - side step and push back over hurdle, 1x20 each side    - step on/off airex pad with each foot (stradle/together) with no UE support, 1x10 leading with each foot.     PT Short Term Goals - 04/15/21 1000      PT SHORT TERM GOAL #1   Title Be independent with initial home exercise program for self-management of symptoms.    Baseline Initial HEP to be established at visit 2 as appropriate (03/13/2021); Initial HEP established visit 2 (03/18/2021);    Time 3    Period Weeks    Status Achieved    Target Date 04/03/21             PT Long Term Goals - 04/15/21 1001      PT LONG TERM GOAL #1   Title Be independent with a long-term home exercise program for self-management of symptoms.    Baseline Initial HEP to be established at visit 2 as appropriate (03/13/2021); Initial HEP provided visit 2 (03/18/2021);    Time 12    Period Weeks    Status Partially Met    Target Date 06/01/21      PT LONG TERM GOAL #2   Title Demonstrate improved FOTO score by 10 units to demonstrate improvement in overall condition and self-reported functional ability.    Baseline to be measured vsiti 2 as appropriate (03/13/2021); 51 (03/18/2021); 51 (04/15/21)    Time 12    Period Weeks    Status On-going    Target Date 06/05/21      PT LONG TERM  GOAL #3   Title Patient will score equal or greater than 25/30 on Functional Gait Assessment to demonstrate reduction in fall risk to low fall risk classification.    Baseline to be measured vsiti 2 as appropriate (03/13/2021); 20/30 moderate fall risk (03/18/2021);    Time 12    Period Weeks    Status On-going    Target Date 06/01/21      PT LONG TERM GOAL #4   Title Patient will ambulate equal or greater than 1000 feet during 6 minute walk test to demonstrate improved community mobility.    Baseline to be measured vsiti 2 as appropriate (03/13/2021); 820 feet with SPC(03/18/2021); 1232f 04/15/21 (4/10 back pain peak that resolves upon rest.    Time 12    Period Weeks    Status Achieved    Target Date 06/01/21      PT LONG TERM GOAL #5   Title Complete community, work and/or recreational activities with 50% less limitation due to current condition.    Baseline Functional Limitations: difficulty with activities that require standing balance including outside activities, taking the puppy out, prolonged standing, household chores, community ambulation, grooming, climbing stairs, yard work, sleeping, fall risk, etc. cannot do what he wants to outside because he is afraid to fall (03/13/2021);    Time 12    Period Weeks    Status On-going    Target Date 06/01/21                 Plan - 04/15/21 1017    Clinical Impression Statement Reassessment this date: 6MWT increased form 8016fto [id number] score unchanged, some improvement in MMT, but largely unchanged. Pt continues to take progress toward goals gradually, but is still very much limited by impaired motor control with gross balance activity, particularly stepping strategy. Pt has clear worsening of motor control with prolonged activity in standing,  rest breaks provided. Pt willl continue to benefit from skilled PT intervention to advance progress toward goals of treatment.    Personal Factors and Comorbidities Age;Comorbidity  3+;Past/Current Experience;Fitness;Time since onset of injury/illness/exacerbation    Comorbidities Relevant past medical history and comorbidities include seizure disorder (controlled with meds), hx R ankle fracture (wears an air brace to prevent pain for years), ACDF (05/2018 for myelopathy), R  Carpal tunnel release (06/2019), BPH, symptoms of peripheral neuropathy at all limbs (fine motor skills in B UE affected), CTS, perfuse degenerative changes on lumbar and cervical spine imaging (see chart for details).    Examination-Activity Limitations Bend;Locomotion Level;Stand;Carry;Sleep;Hygiene/Grooming;Caring for Others;Lift;Stairs;Squat    Examination-Participation Restrictions Shop;Community Activity;Meal Prep;Yard Work    Biomedical scientist Low    Rehab Potential Fair    PT Frequency 2x / week    PT Duration 12 weeks    PT Treatment/Interventions ADLs/Self Care Home Management;Cryotherapy;Traction;Moist Heat;Electrical Stimulation;DME Instruction;Gait training;Stair training;Functional mobility training;Therapeutic activities;Therapeutic exercise;Balance training;Neuromuscular re-education;Patient/family education;Passive range of motion;Dry needling;Manual techniques;Joint Manipulations;Spinal Manipulations    PT Next Visit Plan balance training, LE strength/power    PT Home Exercise Plan Med[employer] Access Code: [email or web address]    Consulted and Agree with Plan of Care Patient           Patient will benefit from skilled therapeutic intervention in order to improve the following deficits and impairments:  Abnormal gait,Decreased knowledge of use of DME,Impaired sensation,Improper body mechanics,Pain,Decreased coordination,Decreased mobility,Postural dysfunction,Decreased activity tolerance,Decreased endurance,Decreased range of motion,Decreased strength,Hypomobility,Impaired perceived functional ability,Impaired UE functional  use,Decreased balance,Difficulty walking,Impaired flexibility  Visit Diagnosis: Unsteadiness on feet  Chronic bilateral low back pain, unspecified whether sciatica present  Difficulty in walking, not elsewhere classified  History of falling     Problem List Patient Active Problem List   Diagnosis Date Noted  . Hearing loss due to cerumen impaction 03/01/2021  . Atypical chest pain 07/10/2020  . 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 Unsteadiness on feet 11/08/2019  . History of spinal surgery 01/17/2019  . Neck pain 01/17/2019  . Weakness of hand 01/17/2019  . Benign prostatic hyperplasia with urinary obstruction 01/17/2019  . Systolic murmur 12/23/7251  . Right carpal tunnel syndrome 02/02/2018  . Ulnar neuropathy at elbow, left 02/02/2018  . Peripheral neuropathy 01/26/2018  . Bilateral buttock pain 06/29/2016  . Failed vision screen 06/29/2016  . Advanced care planning/counseling discussion 06/21/2015  . Medicare annual wellness visit, subsequent 06/18/2014  . Health maintenance examination 06/18/2014  . Chronic ankle pain 06/18/2014  . Incomplete emptying of bladder 06/07/2013  . Vitamin D deficiency 05/20/2013  . THYROID NODULE 05/17/2007  . HLD (hyperlipidemia) 05/17/2007  . TREMOR, ESSENTIAL 05/17/2007  . Essential hypertension 05/17/2007  . Cervical spondylosis with myelopathy and radiculopathy 05/17/2007  . Seizure disorder ([place]) 05/17/2007   10:29 AM, 04/15/21 Etta Grandchild, PT, DPT Physical Therapist - [place] [phone] (Office)    Hendel Gatliff C 04/15/2021, 10:27 AM  [facility] [address] [place], [address] Phone: [phone]   Fax:  [phone]  Name: PERCY WINTERROWD MRN: [id number] Date of Birth: 06/29/1935

## 2021-04-17 ENCOUNTER — Encounter: Payer: Self-pay | Admitting: Physical Therapy

## 2021-04-17 ENCOUNTER — Ambulatory Visit: Payer: Medicare Other | Admitting: Physical Therapy

## 2021-04-17 ENCOUNTER — Other Ambulatory Visit: Payer: Self-pay

## 2021-04-17 DIAGNOSIS — R2681 Unsteadiness on feet: Secondary | ICD-10-CM

## 2021-04-17 DIAGNOSIS — Z9181 History of falling: Secondary | ICD-10-CM

## 2021-04-17 DIAGNOSIS — M545 Low back pain, unspecified: Secondary | ICD-10-CM

## 2021-04-17 DIAGNOSIS — R262 Difficulty in walking, not elsewhere classified: Secondary | ICD-10-CM

## 2021-04-17 DIAGNOSIS — G8929 Other chronic pain: Secondary | ICD-10-CM

## 2021-04-17 NOTE — Therapy (Signed)
[facility] [address] [place], [address] Phone: [phone]   Fax:  [phone]  Physical Therapy Treatment  Patient Details  Name: Mark Holmes MRN: [id number] Date of Birth: 06-08-1935 Referring Provider (PT): Allene Dillon, NP   Encounter Date: 04/17/2021   PT End of Session - 04/17/21 1504    Visit Number 11    Number of Visits 24    Date for PT Re-Evaluation 06/05/21    Authorization Type [employer] MEDICARE reporting period from 04/15/2021    Authorization Time Period 03/13/21-06/05/21    Progress Note Due on Visit 20    PT Start Time 0901    PT Stop Time 0941    PT Time Calculation (min) 40 min    Activity Tolerance Patient tolerated treatment well;Patient limited by fatigue;Patient limited by pain    Behavior During Therapy [facility] for tasks assessed/performed           Past Medical History:  Diagnosis Date  . Benign prostatic hypertrophy with elevated PSA    per Dr. Jacqlyn Larsen  . History of CT scan of brain 1992   normal  . Hyperglycemia 01/2006   102  . Hyperlipemia   . Hypertension   . Seizure disorder [facility])     Past Surgical History:  Procedure Laterality Date  . ANTERIOR CERVICAL DECOMP/DISCECTOMY FUSION  05/2018   for myelopathy -C3/4 ([facility])  . CARPAL TUNNEL RELEASE Right 06/2019   ([facility])  . CATARACT EXTRACTION W/ INTRAOCULAR LENS IMPLANT Right 2012    eye  . History of EEG  1985   normal  . hospitalization  1981   observation for seizuare  . TONSILLECTOMY    . TRANSURETHRAL RESECTION OF PROSTATE  10/2005   [name]  . TRANSURETHRAL RESECTION OF PROSTATE  08/2017   (rpt) [facility]    There were no vitals filed for this visit.   Subjective Assessment - 04/17/21 0922    Subjective Patient reports he is doing well today. Saw his nerve conduction report and says he was unable to understand it all but felt encouraged that the report seemed to be pin-pointing  the problem with his nerves. States his left neck/arm continues to bother him but does not feel that PT for his balance is making this worse. Reports 4-5/10 "a little bit" in his left neck upon arrival.    Pertinent History Patient is a 86 y.o. male who presents to outpatient physical therapy with a referral for medical diagnosis difficulty balancing, lumbar DDD (degenerative disc disease) with request for balance therapy. This patient's chief complaints consist of discomfort of the B LE below the knee, pain in the low back with standing, and difficulty balancing in the setting of chronic R ankle pain, B hand neuropathy symptoms and history of cervical spine disorder with ACDF at C3-4 for myelopathy leading to the following functional deficits: cannot do what he wants to outside because he is afraid to fall, taking the puppy out, prolonged standing, household chores, community ambulation, grooming, climbing stairs, yard work, sleeping, fall risk, etc. .  Relevant past medical history and comorbidities include seizure disorder (controlled with meds), hx R ankle fracture (wears an air brace to prevent pain for years), ACDF (05/2018 for myelopathy), R  Carpal tunnel release (06/2019), BPH, symptoms of peripheral neuropathy at all limbs (fine motor skills in B UE affected), CTS, perfuse degenerative changes on lumbar and cervical spine imaging (see chart for details).  Limitations Standing;Walking;Sitting;Other (comment);House hold activities    How long can you sit comfortably? "a long time' (but will feelit when he stands up)    How long can you stand comfortably? can finish the task but gets really uncomfortable in the back    Diagnostic tests Cervical spine MRI report 11/29/2020: " IMPRESSION:  1. Neurofibroma of the left C2 nerve as seen previously, maximal  transverse diameter 11-12 mm.  2. Previous ACDF C3-4. Canal stenosis remains at this level with AP  diameter of 7 mm, without worsening. No actual cord  compression.  3. Spondylosis and facet arthropathy with degenerative  anterolisthesis of C5-6 and C7-T1. Canal narrowing but no cord  compression.  4. Foraminal stenosis primarily due to facet osteophytes and  uncovertebral osteophytes that could cause neural compression  bilateral at C2-3, bilateral at C3-4, bilateral at C4-5, bilateral  but left more than right at C5-6, bilateral but left more than right  at C6-7 and bilateral but left more than right at C7-T1." Lumbar spine MRI report 11/29/2020: "IMPRESSION:  1. Degenerative disc disease and degenerative facet disease  throughout the lumbar region which could be associated with back  pain. Thoracolumbar scoliotic curvature.  2. L2-3: Moderate central canal stenosis. Bilateral lateral recess  and foraminal stenosis left worse than right. Neural compression  could occur at this level, particularly on the left.  3. L3-4: Bilateral lateral recess and foraminal stenosis, right  worse than left. Neural compression could occur on either side,  particularly on the right.  4. L4-5: Bilateral lateral recess and foraminal stenosis, right more  than left. Neural compression could occur on either side,  particularly the right.  5. L5-S1: Bilateral foraminal narrowing with some potential to  affect either exiting L5 nerve."    Patient Stated Goals he would like to walk "like normal."    Currently in Pain? Yes    Pain Score 5     Pain Location Neck    Pain Orientation Left           TREATMENT:  Neuromuscular Re-education:to improve, balance, postural strength, muscle activation patterns, and stabilization strength required for functional activities(CGA - min A as needed for safety):  - standing static balance: eyesopensemi-narrow stance on compliant surface, rotating head back and forth horizontally, 2x20.    Hurdle activities (6 inch):  - forward step and push back to backward step and push back with intermittent UE support. 1x20 each side.  - side  step and push back over hurdle, 1x20each side   - forward step/push off of dome side of BOSO with unilateral UE support, 1x20 each side (patient liked this exercise). To improve push through ankles and balance.  - step on/off airex pad with each foot (stradle/together) with no UE support, 1x10 leading with each foot.  Therapeutic exercise:to centralize symptoms and improve ROM, strength, muscular endurance, and activity tolerance required for successful completion of functional activities.  - seated hamstring ball rolls with 3kg med ball with back unsupported,2x20each side - sit <> stand from 17 inch chair with 3kg med ball toss,3x14  Pt required multimodal cuing for proper technique and to facilitate improved neuromuscular control, strength, range of motion, and functional ability resulting in improved performance and form.  HOME EXERCISE PROGRAM Access Code: [email or web address] URL: [email or web address] Date: 03/20/2021 Prepared by: Rosita Kea  Exercises Standing Romberg to 1/2 Tandem Stance - 1 x daily - 3 sets - 30 seconds hold Supine Lower Trunk Rotation - 1-2 x daily - 1 sets -  20 reps - 2-5 seconds hold     PT Education - 04/17/21 1503    Education Details exercise purpose/form    Person(s) Educated Patient    Methods Explanation;Demonstration;Tactile cues;Verbal cues    Comprehension Verbalized understanding;Returned demonstration;Verbal cues required;Tactile cues required;Need further instruction            PT Short Term Goals - 04/15/21 1000      PT SHORT TERM GOAL #1   Title Be independent with initial home exercise program for self-management of symptoms.    Baseline Initial HEP to be established at visit 2 as appropriate (03/13/2021); Initial HEP established visit 2 (03/18/2021);    Time 3    Period Weeks    Status Achieved    Target Date 04/03/21             PT Long Term Goals - 04/15/21 1001      PT LONG TERM GOAL #1   Title Be  independent with a long-term home exercise program for self-management of symptoms.    Baseline Initial HEP to be established at visit 2 as appropriate (03/13/2021); Initial HEP provided visit 2 (03/18/2021);    Time 12    Period Weeks    Status Partially Met    Target Date 06/01/21      PT LONG TERM GOAL #2   Title Demonstrate improved FOTO score by 10 units to demonstrate improvement in overall condition and self-reported functional ability.    Baseline to be measured vsiti 2 as appropriate (03/13/2021); 51 (03/18/2021); 51 (04/15/21)    Time 12    Period Weeks    Status On-going    Target Date 06/05/21      PT LONG TERM GOAL #3   Title Patient will score equal or greater than 25/30 on Functional Gait Assessment to demonstrate reduction in fall risk to low fall risk classification.    Baseline to be measured vsiti 2 as appropriate (03/13/2021); 20/30 moderate fall risk (03/18/2021);    Time 12    Period Weeks    Status On-going    Target Date 06/01/21      PT LONG TERM GOAL #4   Title Patient will ambulate equal or greater than 1000 feet during 6 minute walk test to demonstrate improved community mobility.    Baseline to be measured vsiti 2 as appropriate (03/13/2021); 820 feet with SPC(03/18/2021); 1253f 04/15/21 (4/10 back pain peak that resolves upon rest.    Time 12    Period Weeks    Status Achieved    Target Date 06/01/21      PT LONG TERM GOAL #5   Title Complete community, work and/or recreational activities with 50% less limitation due to current condition.    Baseline Functional Limitations: difficulty with activities that require standing balance including outside activities, taking the puppy out, prolonged standing, household chores, community ambulation, grooming, climbing stairs, yard work, sleeping, fall risk, etc. cannot do what he wants to outside because he is afraid to fall (03/13/2021);    Time 12    Period Weeks    Status On-going    Target Date 06/01/21                  Plan - 04/17/21 1507    Clinical Impression Statement Patient tolerated treatment well overall and continues to demonstrate improved LE activity tolerance but does continue to fatigue prior to completing all exercises. Had some left hip pain with forward/backwards hurdle exercises when performed at 20  reps. This resolved with seated rest. Pt reports it has been an issue for him for the last 6 years. Patient would benefit from continued management of limiting condition by skilled physical therapist to address remaining impairments and functional limitations to work towards stated goals and return to PLOF or maximal functional independence.    Personal Factors and Comorbidities Age;Comorbidity 3+;Past/Current Experience;Fitness;Time since onset of injury/illness/exacerbation    Comorbidities Relevant past medical history and comorbidities include seizure disorder (controlled with meds), hx R ankle fracture (wears an air brace to prevent pain for years), ACDF (05/2018 for myelopathy), R  Carpal tunnel release (06/2019), BPH, symptoms of peripheral neuropathy at all limbs (fine motor skills in B UE affected), CTS, perfuse degenerative changes on lumbar and cervical spine imaging (see chart for details).    Examination-Activity Limitations Bend;Locomotion Level;Stand;Carry;Sleep;Hygiene/Grooming;Caring for Others;Lift;Stairs;Squat    Examination-Participation Restrictions Shop;Community Activity;Meal Prep;Yard Work    [employer] Evolving/Moderate complexity    Rehab Potential Fair    PT Frequency 2x / week    PT Duration 12 weeks    PT Treatment/Interventions ADLs/Self Care Home Management;Cryotherapy;Traction;Moist Heat;Electrical Stimulation;DME Instruction;Gait training;Stair training;Functional mobility training;Therapeutic activities;Therapeutic exercise;Balance training;Neuromuscular re-education;Patient/family education;Passive range of motion;Dry needling;Manual  techniques;Joint Manipulations;Spinal Manipulations    PT Next Visit Plan balance training, LE strength/power    PT Home Exercise Plan Medbridge Access Code: [email or web address]    Consulted and Agree with Plan of Care Patient           Patient will benefit from skilled therapeutic intervention in order to improve the following deficits and impairments:  Abnormal gait,Decreased knowledge of use of DME,Impaired sensation,Improper body mechanics,Pain,Decreased coordination,Decreased mobility,Postural dysfunction,Decreased activity tolerance,Decreased endurance,Decreased range of motion,Decreased strength,Hypomobility,Impaired perceived functional ability,Impaired UE functional use,Decreased balance,Difficulty walking,Impaired flexibility  Visit Diagnosis: Unsteadiness on feet  Chronic bilateral low back pain, unspecified whether sciatica present  Difficulty in walking, not elsewhere classified  History of falling     Problem List Patient Active Problem List   Diagnosis Date Noted  . Hearing loss due to cerumen impaction 03/01/2021  . Atypical chest pain 07/10/2020  . 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 Unsteadiness on feet 11/08/2019  . History of spinal surgery 01/17/2019  . Neck pain 01/17/2019  . Weakness of hand 01/17/2019  . Benign prostatic hyperplasia with urinary obstruction 01/17/2019  . Systolic murmur 56/38/7564  . Right carpal tunnel syndrome 02/02/2018  . Ulnar neuropathy at elbow, left 02/02/2018  . Peripheral neuropathy 01/26/2018  . Bilateral buttock pain 06/29/2016  . Failed vision screen 06/29/2016  . Advanced care planning/counseling discussion 06/21/2015  . Medicare annual wellness visit, subsequent 06/18/2014  . Health maintenance examination 06/18/2014  . Chronic ankle pain 06/18/2014  . Incomplete emptying of bladder 06/07/2013  . Vitamin D deficiency 05/20/2013  . THYROID NODULE 05/17/2007  . HLD (hyperlipidemia) 05/17/2007  . TREMOR, ESSENTIAL 05/17/2007  .  Essential hypertension 05/17/2007  . Cervical spondylosis with myelopathy and radiculopathy 05/17/2007  . Seizure disorder ([place]) 05/17/2007   Everlean Alstrom. Graylon Good, PT, DPT 04/17/21, 3:09 PM  [facility] [address], [place], [address] Phone: [phone]   Fax:  [phone]  Name: SHIVANSH HARDAWAY MRN: [id number] Date of Birth: [birth year]

## 2021-04-22 ENCOUNTER — Ambulatory Visit: Payer: Medicare Other | Attending: Family Medicine | Admitting: Physical Therapy

## 2021-04-22 ENCOUNTER — Encounter: Payer: Self-pay | Admitting: Physical Therapy

## 2021-04-22 ENCOUNTER — Other Ambulatory Visit: Payer: Self-pay

## 2021-04-22 DIAGNOSIS — M545 Low back pain, unspecified: Secondary | ICD-10-CM | POA: Diagnosis present

## 2021-04-22 DIAGNOSIS — R2681 Unsteadiness on feet: Secondary | ICD-10-CM | POA: Diagnosis not present

## 2021-04-22 DIAGNOSIS — G8929 Other chronic pain: Secondary | ICD-10-CM | POA: Insufficient documentation

## 2021-04-22 DIAGNOSIS — Z9181 History of falling: Secondary | ICD-10-CM | POA: Insufficient documentation

## 2021-04-22 DIAGNOSIS — R262 Difficulty in walking, not elsewhere classified: Secondary | ICD-10-CM | POA: Insufficient documentation

## 2021-04-22 NOTE — Therapy (Signed)
[facility] [address], [place], [address] Phone: [phone]   Fax:  [phone]  Physical Therapy Treatment  Patient Details  Name: Mark Holmes MRN: [id number] Date of Birth: 10-Feb-1935 Referring Provider (PT): Allene Dillon, NP   Encounter Date: 04/22/2021   PT End of Session - 04/22/21 0946    Visit Number 12    Number of Visits 24    Date for PT Re-Evaluation 06/05/21    Authorization Type [employer] MEDICARE reporting period from 04/15/2021    Authorization Time Period 03/13/21-06/05/21    Progress Note Due on Visit 20    PT Start Time 0902    PT Stop Time 0945    PT Time Calculation (min) 43 min    Activity Tolerance Patient tolerated treatment well;Patient limited by fatigue;Patient limited by pain    Behavior During Therapy [facility] for tasks assessed/performed           Past Medical History:  Diagnosis Date  . Benign prostatic hypertrophy with elevated PSA    per Dr. Jacqlyn Larsen  . History of CT scan of brain 1992   normal  . Hyperglycemia 01/2006   102  . Hyperlipemia   . Hypertension   . Seizure disorder [facility])     Past Surgical History:  Procedure Laterality Date  . ANTERIOR CERVICAL DECOMP/DISCECTOMY FUSION  05/2018   for myelopathy -C3/4 ([facility])  . CARPAL TUNNEL RELEASE Right 06/2019   ([facility])  . CATARACT EXTRACTION W/ INTRAOCULAR LENS IMPLANT Right 2012   [facility]  . History of EEG  1985   normal  . hospitalization  1981   observation for seizuare  . TONSILLECTOMY    . TRANSURETHRAL RESECTION OF PROSTATE  10/2005   [name]  . TRANSURETHRAL RESECTION OF PROSTATE  08/2017   (rpt) [facility]    There were no vitals filed for this visit.   Subjective Assessment - 04/22/21 0954    Subjective Patient reports he is feeling well today. Continues to have pain in hi slower legs after prolonged activity. Left neck/arm pain comes and goes. Was not sore following last  treatment session.    Pertinent History Patient is a 86 y.o. male who presents to outpatient physical therapy with a referral for medical diagnosis difficulty balancing, lumbar DDD (degenerative disc disease) with request for balance therapy. This patient's chief complaints consist of discomfort of the B LE below the knee, pain in the low back with standing, and difficulty balancing in the setting of chronic R ankle pain, B hand neuropathy symptoms and history of cervical spine disorder with ACDF at C3-4 for myelopathy leading to the following functional deficits: cannot do what he wants to outside because he is afraid to fall, taking the puppy out, prolonged standing, household chores, community ambulation, grooming, climbing stairs, yard work, sleeping, fall risk, etc. .  Relevant past medical history and comorbidities include seizure disorder (controlled with meds), hx R ankle fracture (wears an air brace to prevent pain for years), ACDF (05/2018 for myelopathy), R  Carpal tunnel release (06/2019), BPH, symptoms of peripheral neuropathy at all limbs (fine motor skills in B UE affected), CTS, perfuse degenerative changes on lumbar and cervical spine imaging (see chart for details).    Limitations Standing;Walking;Sitting;Other (comment);House hold activities    How long can you sit comfortably? "a long time' (but will feelit when he stands up)    How long can you stand comfortably? can finish the task  but gets really uncomfortable in the back    Diagnostic tests Cervical spine MRI report 11/29/2020: " IMPRESSION:  1. Neurofibroma of the left C2 nerve as seen previously, maximal  transverse diameter 11-12 mm.  2. Previous ACDF C3-4. Canal stenosis remains at this level with AP  diameter of 7 mm, without worsening. No actual cord compression.  3. Spondylosis and facet arthropathy with degenerative  anterolisthesis of C5-6 and C7-T1. Canal narrowing but no cord  compression.  4. Foraminal stenosis primarily due to  facet osteophytes and  uncovertebral osteophytes that could cause neural compression  bilateral at C2-3, bilateral at C3-4, bilateral at C4-5, bilateral  but left more than right at C5-6, bilateral but left more than right  at C6-7 and bilateral but left more than right at C7-T1." Lumbar spine MRI report 11/29/2020: "IMPRESSION:  1. Degenerative disc disease and degenerative facet disease  throughout the lumbar region which could be associated with back  pain. Thoracolumbar scoliotic curvature.  2. L2-3: Moderate central canal stenosis. Bilateral lateral recess  and foraminal stenosis left worse than right. Neural compression  could occur at this level, particularly on the left.  3. L3-4: Bilateral lateral recess and foraminal stenosis, right  worse than left. Neural compression could occur on either side,  particularly on the right.  4. L4-5: Bilateral lateral recess and foraminal stenosis, right more  than left. Neural compression could occur on either side,  particularly the right.  5. L5-S1: Bilateral foraminal narrowing with some potential to  affect either exiting L5 nerve."    Patient Stated Goals he would like to walk "like normal."    Currently in Pain? Yes    Pain Score --   no numeric rating provided           TREATMENT:  Neuromuscular Re-education:to improve, balance, postural strength, muscle activation patterns, and stabilization strength required for functional activities(CGA - min A as needed for safety): - standing static balance: eyesopensemi-narrow stance on compliant surface, rotating head back and forth horizontally,2x20.   Hurdle activities (6 inch):  - forward step and push back to backward step and push back with intermittent UE support. 1x15 each side. (with minimal UE support, legs flexing)  - side step and push back over hurdle, 1x20each side    - step on/off airex pad with each foot (stradle/together) with no UE support, 1x10 leading with each foot. -  forward step/push off of dome side of BOSO with unilateral UE support, 1x10 each side (patient liked this exercise). To improve push through ankles and balance. - Bouncing on mini trampoline with touchdown UE support as needed. 2x30 seconds - double leg jumping on mini trampoline with B UE support x 20 - jogging in place on mini trampoline, 1x10 each side with UE support  Therapeutic exercise:to centralize symptoms and improve ROM, strength, muscular endurance, and activity tolerance required for successful completion of functional activities.  - seated hamstring ball rolls with 3kg med ball with back unsupported,3x20each side - sit <> stand from 17 inch chair with 3kg med ball toss,3x15  Pt required multimodal cuing for proper technique and to facilitate improved neuromuscular control, strength, range of motion, and functional ability resulting in improved performance and form.  HOME EXERCISE PROGRAM Access Code: [email or web address] URL: [email or web address] Date: 03/20/2021 Prepared by: Rosita Kea  Exercises Standing Romberg to 1/2 Tandem Stance - 1 x daily - 3 sets - 30 seconds hold Supine Lower Trunk Rotation - 1-2 x daily - 1 sets - 20  reps - 2-5 seconds hold    PT Education - 04/22/21 0946    Education Details exercise purpose/form. self-management technique    Person(s) Educated Patient    Methods Explanation;Demonstration;Verbal cues;Tactile cues    Comprehension Verbalized understanding;Returned demonstration;Verbal cues required;Tactile cues required;Need further instruction            PT Short Term Goals - 04/15/21 1000      PT SHORT TERM GOAL #1   Title Be independent with initial home exercise program for self-management of symptoms.    Baseline Initial HEP to be established at visit 2 as appropriate (03/13/2021); Initial HEP established visit 2 (03/18/2021);    Time 3    Period Weeks    Status Achieved    Target Date 04/03/21              PT Long Term Goals - 04/15/21 1001      PT LONG TERM GOAL #1   Title Be independent with a long-term home exercise program for self-management of symptoms.    Baseline Initial HEP to be established at visit 2 as appropriate (03/13/2021); Initial HEP provided visit 2 (03/18/2021);    Time 12    Period Weeks    Status Partially Met    Target Date 06/01/21      PT LONG TERM GOAL #2   Title Demonstrate improved FOTO score by 10 units to demonstrate improvement in overall condition and self-reported functional ability.    Baseline to be measured vsiti 2 as appropriate (03/13/2021); 51 (03/18/2021); 51 (04/15/21)    Time 12    Period Weeks    Status On-going    Target Date 06/05/21      PT LONG TERM GOAL #3   Title Patient will score equal or greater than 25/30 on Functional Gait Assessment to demonstrate reduction in fall risk to low fall risk classification.    Baseline to be measured vsiti 2 as appropriate (03/13/2021); 20/30 moderate fall risk (03/18/2021);    Time 12    Period Weeks    Status On-going    Target Date 06/01/21      PT LONG TERM GOAL #4   Title Patient will ambulate equal or greater than 1000 feet during 6 minute walk test to demonstrate improved community mobility.    Baseline to be measured vsiti 2 as appropriate (03/13/2021); 820 feet with SPC(03/18/2021); 1261f 04/15/21 (4/10 back pain peak that resolves upon rest.    Time 12    Period Weeks    Status Achieved    Target Date 06/01/21      PT LONG TERM GOAL #5   Title Complete community, work and/or recreational activities with 50% less limitation due to current condition.    Baseline Functional Limitations: difficulty with activities that require standing balance including outside activities, taking the puppy out, prolonged standing, household chores, community ambulation, grooming, climbing stairs, yard work, sleeping, fall risk, etc. cannot do what he wants to outside because he is afraid to fall (03/13/2021);    Time 12     Period Weeks    Status On-going    Target Date 06/01/21                 Plan - 04/22/21 0946    Clinical Impression Statement Patient tolerated treatment well with notable weakness developing on legs (reports feels it in ankles, lower legs, knees, and hips) with prolonged standing activities. Continues to show progress in ability to accept weight through R ankle  and coordinate legs in dynamic balance activities. Patient would benefit from continued management of limiting condition by skilled physical therapist to address remaining impairments and functional limitations to work towards stated goals and return to PLOF or maximal functional independence.    Personal Factors and Comorbidities Age;Comorbidity 3+;Past/Current Experience;Fitness;Time since onset of injury/illness/exacerbation    Comorbidities Relevant past medical history and comorbidities include seizure disorder (controlled with meds), hx R ankle fracture (wears an air brace to prevent pain for years), ACDF (05/2018 for myelopathy), R  Carpal tunnel release (06/2019), BPH, symptoms of peripheral neuropathy at all limbs (fine motor skills in B UE affected), CTS, perfuse degenerative changes on lumbar and cervical spine imaging (see chart for details).    Examination-Activity Limitations Bend;Locomotion Level;Stand;Carry;Sleep;Hygiene/Grooming;Caring for Others;Lift;Stairs;Squat    Examination-Participation Restrictions Shop;Community Activity;Meal Prep;Yard Work    [employer] Evolving/Moderate complexity    Rehab Potential Fair    PT Frequency 2x / week    PT Duration 12 weeks    PT Treatment/Interventions ADLs/Self Care Home Management;Cryotherapy;Traction;Moist Heat;Electrical Stimulation;DME Instruction;Gait training;Stair training;Functional mobility training;Therapeutic activities;Therapeutic exercise;Balance training;Neuromuscular re-education;Patient/family education;Passive range of motion;Dry  needling;Manual techniques;Joint Manipulations;Spinal Manipulations    PT Next Visit Plan balance training, LE strength/power    PT Home Exercise Plan Medbridge Access Code: [email or web address]    Consulted and Agree with Plan of Care Patient           Patient will benefit from skilled therapeutic intervention in order to improve the following deficits and impairments:  Abnormal gait,Decreased knowledge of use of DME,Impaired sensation,Improper body mechanics,Pain,Decreased coordination,Decreased mobility,Postural dysfunction,Decreased activity tolerance,Decreased endurance,Decreased range of motion,Decreased strength,Hypomobility,Impaired perceived functional ability,Impaired UE functional use,Decreased balance,Difficulty walking,Impaired flexibility  Visit Diagnosis: Unsteadiness on feet  Chronic bilateral low back pain, unspecified whether sciatica present  Difficulty in walking, not elsewhere classified  History of falling     Problem List Patient Active Problem List   Diagnosis Date Noted  . Hearing loss due to cerumen impaction 03/01/2021  . Atypical chest pain 07/10/2020  . 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 Unsteadiness on feet 11/08/2019  . History of spinal surgery 01/17/2019  . Neck pain 01/17/2019  . Weakness of hand 01/17/2019  . Benign prostatic hyperplasia with urinary obstruction 01/17/2019  . Systolic murmur 84/13/2440  . Right carpal tunnel syndrome 02/02/2018  . Ulnar neuropathy at elbow, left 02/02/2018  . Peripheral neuropathy 01/26/2018  . Bilateral buttock pain 06/29/2016  . Failed vision screen 06/29/2016  . Advanced care planning/counseling discussion 06/21/2015  . Medicare annual wellness visit, subsequent 06/18/2014  . Health maintenance examination 06/18/2014  . Chronic ankle pain 06/18/2014  . Incomplete emptying of bladder 06/07/2013  . Vitamin D deficiency 05/20/2013  . THYROID NODULE 05/17/2007  . HLD (hyperlipidemia) 05/17/2007  . TREMOR, ESSENTIAL  05/17/2007  . Essential hypertension 05/17/2007  . Cervical spondylosis with myelopathy and radiculopathy 05/17/2007  . Seizure disorder ([place]) 05/17/2007    Everlean Alstrom. Graylon Good, PT, DPT 04/22/21, 9:55 AM  [facility] [address], [place], [address] Phone: [phone]   Fax:  [phone]  Name: Mark Holmes MRN: [id number] Date of Birth: 16-Aug-1935

## 2021-04-24 ENCOUNTER — Ambulatory Visit: Payer: Medicare Other | Admitting: Physical Therapy

## 2021-04-29 ENCOUNTER — Ambulatory Visit: Payer: Medicare Other | Admitting: Physical Therapy

## 2021-04-29 ENCOUNTER — Other Ambulatory Visit: Payer: Self-pay

## 2021-04-29 DIAGNOSIS — G8929 Other chronic pain: Secondary | ICD-10-CM

## 2021-04-29 DIAGNOSIS — R2681 Unsteadiness on feet: Secondary | ICD-10-CM

## 2021-04-29 DIAGNOSIS — R262 Difficulty in walking, not elsewhere classified: Secondary | ICD-10-CM

## 2021-04-29 DIAGNOSIS — Z9181 History of falling: Secondary | ICD-10-CM

## 2021-04-29 DIAGNOSIS — M545 Low back pain, unspecified: Secondary | ICD-10-CM

## 2021-04-29 NOTE — Therapy (Signed)
[facility] [address], [place], [address] Phone: [phone]   Fax:  [phone]  Physical Therapy Treatment  Patient Details  Name: Mark Holmes MRN: [id number] Date of Birth: 1935/09/03 Referring Provider (PT): Allene Dillon, NP   Encounter Date: 04/29/2021   PT End of Session - 04/29/21 0958    Visit Number 13    Number of Visits 24    Date for PT Re-Evaluation 06/05/21    Authorization Type [employer] MEDICARE reporting period from 04/15/2021    Authorization Time Period 03/13/21-06/05/21    Progress Note Due on Visit 20    PT Start Time 0902    PT Stop Time 0947    PT Time Calculation (min) 45 min    Equipment Utilized During Treatment Gait belt    Activity Tolerance Patient tolerated treatment well;Patient limited by fatigue;Patient limited by pain    Behavior During Therapy [facility] for tasks assessed/performed           Past Medical History:  Diagnosis Date  . Benign prostatic hypertrophy with elevated PSA    per Dr. Jacqlyn Larsen  . History of CT scan of brain 1992   normal  . Hyperglycemia 01/2006   102  . Hyperlipemia   . Hypertension   . Seizure disorder [facility])     Past Surgical History:  Procedure Laterality Date  . ANTERIOR CERVICAL DECOMP/DISCECTOMY FUSION  05/2018   for myelopathy -C3/4 ([facility])  . CARPAL TUNNEL RELEASE Right 06/2019   ([facility])  . CATARACT EXTRACTION W/ INTRAOCULAR LENS IMPLANT Right 2012   [facility]  . History of EEG  1985   normal  . hospitalization  1981   observation for seizuare  . TONSILLECTOMY    . TRANSURETHRAL RESECTION OF PROSTATE  10/2005   [name]  . TRANSURETHRAL RESECTION OF PROSTATE  08/2017   (rpt) [facility]    There were no vitals filed for this visit.     TREATMENT:  Neuromuscular Re-education:to improve, balance, postural strength, muscle activation patterns, and stabilization strength required for functional  activities(CGA - min A as needed for safety):  - standing static balance: eyesopensemi-narrow stance on compliant surface, rotating head back and forth horizontally,2x20.   Hurdle activities (6 inch):  - forward step and push back to backward step and push back with intermittent UE support. 1x20 each side.  - side step and push back over hurdle, 1x20each side, no UE support  - step on/off airex pad with each foot (stradle/together) with no UE support, 1x10 leading with each foot. - forward step/push off of dome side of BOSO with unilateral UE support, 1x20 each side (patient liked this exercise). To improve push through ankles and balance.  - step up and over large dynadisc, leading with same leg, with U UE support, 1x5 each side. (VERY difficult, needed min A to prevent fall).  - practice/education walking with SPC: patient prefers holding it in R UE to use in coordination with L LE.   Therapeutic exercise:to centralize symptoms and improve ROM, strength, muscular endurance, and activity tolerance required for successful completion of functional activities.  - double leg hop with B UE support, 1x10 - mini jogging in place with BUE support x10 each side - mini jogging along TM bar 4x5 feet with touchdown UE support as needed.  - trampoline bounces 1x20 intermittent UE support  Circuit: - sit <> stand from 17 inch chair with 3kg med ball toss,3x15 (cuing to  at least start with push throw).  - seated hamstring ball rolls with 3kg med ball with back unsupported,3x20each side  Pt required multimodal cuing for proper technique and to facilitate improved neuromuscular control, strength, range of motion, and functional ability resulting in improved performance and form.  HOME EXERCISE PROGRAM Access Code: [email or web address] URL: [email or web address] Date: 03/20/2021 Prepared by: Rosita Kea  Exercises Standing Romberg to 1/2 Tandem Stance - 1 x daily - 3 sets -  30 seconds hold Supine Lower Trunk Rotation - 1-2 x daily - 1 sets - 20 reps - 2-5 seconds hold     PT Education - 04/29/21 0958    Education Details exercise purpose/form. self-management technique    Person(s) Educated Patient    Methods Explanation;Demonstration;Tactile cues;Verbal cues    Comprehension Verbalized understanding;Returned demonstration;Verbal cues required;Tactile cues required;Need further instruction            PT Short Term Goals - 04/15/21 1000      PT SHORT TERM GOAL #1   Title Be independent with initial home exercise program for self-management of symptoms.    Baseline Initial HEP to be established at visit 2 as appropriate (03/13/2021); Initial HEP established visit 2 (03/18/2021);    Time 3    Period Weeks    Status Achieved    Target Date 04/03/21             PT Long Term Goals - 04/15/21 1001      PT LONG TERM GOAL #1   Title Be independent with a long-term home exercise program for self-management of symptoms.    Baseline Initial HEP to be established at visit 2 as appropriate (03/13/2021); Initial HEP provided visit 2 (03/18/2021);    Time 12    Period Weeks    Status Partially Met    Target Date 06/01/21      PT LONG TERM GOAL #2   Title Demonstrate improved FOTO score by 10 units to demonstrate improvement in overall condition and self-reported functional ability.    Baseline to be measured vsiti 2 as appropriate (03/13/2021); 51 (03/18/2021); 51 (04/15/21)    Time 12    Period Weeks    Status On-going    Target Date 06/05/21      PT LONG TERM GOAL #3   Title Patient will score equal or greater than 25/30 on Functional Gait Assessment to demonstrate reduction in fall risk to low fall risk classification.    Baseline to be measured vsiti 2 as appropriate (03/13/2021); 20/30 moderate fall risk (03/18/2021);    Time 12    Period Weeks    Status On-going    Target Date 06/01/21      PT LONG TERM GOAL #4   Title Patient will ambulate equal  or greater than 1000 feet during 6 minute walk test to demonstrate improved community mobility.    Baseline to be measured vsiti 2 as appropriate (03/13/2021); 820 feet with SPC(03/18/2021); 1225f 04/15/21 (4/10 back pain peak that resolves upon rest.    Time 12    Period Weeks    Status Achieved    Target Date 06/01/21      PT LONG TERM GOAL #5   Title Complete community, work and/or recreational activities with 50% less limitation due to current condition.    Baseline Functional Limitations: difficulty with activities that require standing balance including outside activities, taking the puppy out, prolonged standing, household chores, community ambulation, grooming, climbing stairs, yard work, sleeping, fall risk, etc. cannot do  what he wants to outside because he is afraid to fall (03/13/2021);    Time 12    Period Weeks    Status On-going    Target Date 06/01/21                 Plan - 04/29/21 0958    Clinical Impression Statement Patient tolerated treatment well overall and continues to demonstrate improvements in standing tolerance and dynamic balance. Does have some difficulty with knee buckling and coordination when tired and does complain of R ankle pain with activities requiring increased force through R ankle. Has limited R ankle ROM, strength, and power that decreases his activity tolerance on that side. Patient would benefit from continued management of limiting condition by skilled physical therapist to address remaining impairments and functional limitations to work towards stated goals and return to PLOF or maximal functional independence.    Personal Factors and Comorbidities Age;Comorbidity 3+;Past/Current Experience;Fitness;Time since onset of injury/illness/exacerbation    Comorbidities Relevant past medical history and comorbidities include seizure disorder (controlled with meds), hx R ankle fracture (wears an air brace to prevent pain for years), ACDF (05/2018 for  myelopathy), R  Carpal tunnel release (06/2019), BPH, symptoms of peripheral neuropathy at all limbs (fine motor skills in B UE affected), CTS, perfuse degenerative changes on lumbar and cervical spine imaging (see chart for details).    Examination-Activity Limitations Bend;Locomotion Level;Stand;Carry;Sleep;Hygiene/Grooming;Caring for Others;Lift;Stairs;Squat    Examination-Participation Restrictions Shop;Community Activity;Meal Prep;Yard Work    [employer] Evolving/Moderate complexity    Rehab Potential Fair    PT Frequency 2x / week    PT Duration 12 weeks    PT Treatment/Interventions ADLs/Self Care Home Management;Cryotherapy;Traction;Moist Heat;Electrical Stimulation;DME Instruction;Gait training;Stair training;Functional mobility training;Therapeutic activities;Therapeutic exercise;Balance training;Neuromuscular re-education;Patient/family education;Passive range of motion;Dry needling;Manual techniques;Joint Manipulations;Spinal Manipulations    PT Next Visit Plan balance training, LE strength/power    PT Home Exercise Plan Medbridge Access Code: [email or web address]    Consulted and Agree with Plan of Care Patient           Patient will benefit from skilled therapeutic intervention in order to improve the following deficits and impairments:  Abnormal gait,Decreased knowledge of use of DME,Impaired sensation,Improper body mechanics,Pain,Decreased coordination,Decreased mobility,Postural dysfunction,Decreased activity tolerance,Decreased endurance,Decreased range of motion,Decreased strength,Hypomobility,Impaired perceived functional ability,Impaired UE functional use,Decreased balance,Difficulty walking,Impaired flexibility  Visit Diagnosis: Unsteadiness on feet  Chronic bilateral low back pain, unspecified whether sciatica present  Difficulty in walking, not elsewhere classified  History of falling     Problem List Patient Active Problem List   Diagnosis Date  Noted  . Hearing loss due to cerumen impaction 03/01/2021  . Atypical chest pain 07/10/2020  . 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 Unsteadiness on feet 11/08/2019  . History of spinal surgery 01/17/2019  . Neck pain 01/17/2019  . Weakness of hand 01/17/2019  . Benign prostatic hyperplasia with urinary obstruction 01/17/2019  . Systolic murmur 56/38/7564  . Right carpal tunnel syndrome 02/02/2018  . Ulnar neuropathy at elbow, left 02/02/2018  . Peripheral neuropathy 01/26/2018  . Bilateral buttock pain 06/29/2016  . Failed vision screen 06/29/2016  . Advanced care planning/counseling discussion 06/21/2015  . Medicare annual wellness visit, subsequent 06/18/2014  . Health maintenance examination 06/18/2014  . Chronic ankle pain 06/18/2014  . Incomplete emptying of bladder 06/07/2013  . Vitamin D deficiency 05/20/2013  . THYROID NODULE 05/17/2007  . HLD (hyperlipidemia) 05/17/2007  . TREMOR, ESSENTIAL 05/17/2007  . Essential hypertension 05/17/2007  . Cervical spondylosis with myelopathy and radiculopathy 05/17/2007  .  Seizure disorder ([place]) 05/17/2007    Everlean Alstrom. Graylon Good, PT, DPT 04/29/21, 10:01 AM  [facility] [address], [place], [address] Phone: [phone]   Fax:  [phone]  Name: YAMIR CARIGNAN MRN: [id number] Date of Birth: 03/01/1935

## 2021-05-01 ENCOUNTER — Ambulatory Visit: Payer: Medicare Other | Admitting: Physical Therapy

## 2021-05-01 ENCOUNTER — Encounter: Payer: Self-pay | Admitting: Physical Therapy

## 2021-05-01 ENCOUNTER — Other Ambulatory Visit: Payer: Self-pay

## 2021-05-01 DIAGNOSIS — Z9181 History of falling: Secondary | ICD-10-CM

## 2021-05-01 DIAGNOSIS — M545 Low back pain, unspecified: Secondary | ICD-10-CM

## 2021-05-01 DIAGNOSIS — R2681 Unsteadiness on feet: Secondary | ICD-10-CM | POA: Diagnosis not present

## 2021-05-01 DIAGNOSIS — G8929 Other chronic pain: Secondary | ICD-10-CM

## 2021-05-01 DIAGNOSIS — R262 Difficulty in walking, not elsewhere classified: Secondary | ICD-10-CM

## 2021-05-01 NOTE — Therapy (Signed)
[facility] [address], [place], [address] Phone: [phone]   Fax:  [phone]  Physical Therapy Treatment  Patient Details  Name: Mark Holmes MRN: [id number] Date of Birth: [birth year] Referring Provider (PT): Allene Dillon, NP   Encounter Date: 05/01/2021   PT End of Session - 05/01/21 1218    Visit Number 14    Number of Visits 24    Date for PT Re-Evaluation 06/05/21    Authorization Type [employer] MEDICARE reporting period from 04/15/2021    Authorization Time Period 03/13/21-06/05/21    Progress Note Due on Visit 20    PT Start Time 0900    PT Stop Time 0940    PT Time Calculation (min) 40 min    Equipment Utilized During Treatment Gait belt    Activity Tolerance Patient tolerated treatment well;Patient limited by fatigue;Patient limited by pain    Behavior During Therapy [facility] for tasks assessed/performed           Past Medical History:  Diagnosis Date  . Benign prostatic hypertrophy with elevated PSA    per Dr. Jacqlyn Larsen  . History of CT scan of brain 1992   normal  . Hyperglycemia 01/2006   102  . Hyperlipemia   . Hypertension   . Seizure disorder [facility])     Past Surgical History:  Procedure Laterality Date  . ANTERIOR CERVICAL DECOMP/DISCECTOMY FUSION  05/2018   for myelopathy -C3/4 ([facility])  . CARPAL TUNNEL RELEASE Right 06/2019   ([facility])  . CATARACT EXTRACTION W/ INTRAOCULAR LENS IMPLANT Right 2012   [facility]  . History of EEG  1985   normal  . hospitalization  1981   observation for seizuare  . TONSILLECTOMY    . TRANSURETHRAL RESECTION OF PROSTATE  10/2005   [name]  . TRANSURETHRAL RESECTION OF PROSTATE  08/2017   (rpt) [facility]    There were no vitals filed for this visit.   Subjective Assessment - 05/01/21 1215    Subjective Patient reports he is feeling well today. He said his R ankle was a bit sore after last sesion but did not keep him up at night  and did not last very long. He wrapped it today to see if that would help during his session today. He continues to read his nerve conduction report and would like PT to help him understand it better. Reports continued swollen feeling in his feet after prolonged sitting. Arrives with [facility] as usual. States he was doing some mini-jogging in place at home and is impressed with how hard it is now compared to how he used to do it all the time with ease as a younger man. Has questions about his POC.    Pertinent History Patient is a 86 y.o. male who presents to outpatient physical therapy with a referral for medical diagnosis difficulty balancing, lumbar DDD (degenerative disc disease) with request for balance therapy. This patient's chief complaints consist of discomfort of the B LE below the knee, pain in the low back with standing, and difficulty balancing in the setting of chronic R ankle pain, B hand neuropathy symptoms and history of cervical spine disorder with ACDF at C3-4 for myelopathy leading to the following functional deficits: cannot do what he wants to outside because he is afraid to fall, taking the puppy out, prolonged standing, household chores, community ambulation, grooming, climbing stairs, yard work, sleeping, fall risk, etc. .  Relevant past medical history and  comorbidities include seizure disorder (controlled with meds), hx R ankle fracture (wears an air brace to prevent pain for years), ACDF (05/2018 for myelopathy), R  Carpal tunnel release (06/2019), BPH, symptoms of peripheral neuropathy at all limbs (fine motor skills in B UE affected), CTS, perfuse degenerative changes on lumbar and cervical spine imaging (see chart for details).    Limitations Standing;Walking;Sitting;Other (comment);House hold activities    How long can you sit comfortably? "a long time' (but will feelit when he stands up)    How long can you stand comfortably? can finish the task but gets really uncomfortable in the back     Diagnostic tests Cervical spine MRI report 11/29/2020: " IMPRESSION:  1. Neurofibroma of the left C2 nerve as seen previously, maximal  transverse diameter 11-12 mm.  2. Previous ACDF C3-4. Canal stenosis remains at this level with AP  diameter of 7 mm, without worsening. No actual cord compression.  3. Spondylosis and facet arthropathy with degenerative  anterolisthesis of C5-6 and C7-T1. Canal narrowing but no cord  compression.  4. Foraminal stenosis primarily due to facet osteophytes and  uncovertebral osteophytes that could cause neural compression  bilateral at C2-3, bilateral at C3-4, bilateral at C4-5, bilateral  but left more than right at C5-6, bilateral but left more than right  at C6-7 and bilateral but left more than right at C7-T1." Lumbar spine MRI report 11/29/2020: "IMPRESSION:  1. Degenerative disc disease and degenerative facet disease  throughout the lumbar region which could be associated with back  pain. Thoracolumbar scoliotic curvature.  2. L2-3: Moderate central canal stenosis. Bilateral lateral recess  and foraminal stenosis left worse than right. Neural compression  could occur at this level, particularly on the left.  3. L3-4: Bilateral lateral recess and foraminal stenosis, right  worse than left. Neural compression could occur on either side,  particularly on the right.  4. L4-5: Bilateral lateral recess and foraminal stenosis, right more  than left. Neural compression could occur on either side,  particularly the right.  5. L5-S1: Bilateral foraminal narrowing with some potential to  affect either exiting L5 nerve."    Patient Stated Goals he would like to walk "like normal."    Currently in Pain? No/denies            TREATMENT:  Neuromuscular Re-education:to improve, balance, postural strength, muscle activation patterns, and stabilization strength required for functional activities(CGA - min A as needed for safety):  - standing static balance:  eyesopensemi-narrow stance on compliant surface, rotating head back and forth horizontally,1x20.  - education on nerve conduction test results (within scope of practice)  Hurdle activities (6 inch):  - forward step and push back to backward step and push back with intermittent UE support. 1x20 each side.  - side step and push back over hurdle, 1x20each side, no UE support  - step on/off airex+a2z pad with each foot (stradle/together) with intermittant UE support, 1x10 leading with each foot.  - forward step/push off of dome side of BOSO with unilateral UE support, 1x20 each side (patient liked this exercise). To improve push through ankles and balance.  Standing on FLAT side of BOSU with touchdown UE support as needed:  - balance with self selected stance, 1 minute - forward/backwards ankle pumps x 20 - side to side leg pumps x 20 each side - squats with B UE support, 1x10 (needed mod A a few times to return to standing due to excessively deep squat).   Therapeutic exercise:to centralize  symptoms and improve ROM, strength, muscular endurance, and activity tolerance required for successful completion of functional activities.  - double leg hop with B UE support, 1x20 - mini jogging in place with BUE support x20 each side - mini jogging 4x30 feet with CGA   Pt required multimodal cuing for proper technique and to facilitate improved neuromuscular control, strength, range of motion, and functional ability resulting in improved performance and form.  HOME EXERCISE PROGRAM Access Code: [email or web address] URL: [email or web address] Date: 03/20/2021 Prepared by: Rosita Kea  Exercises Standing Romberg to 1/2 Tandem Stance - 1 x daily - 3 sets - 30 seconds hold Supine Lower Trunk Rotation - 1-2 x daily - 1 sets - 20 reps - 2-5 seconds hold     PT Education - 05/01/21 1217    Education Details exercise purpose/form. self-management technique    Person(s) Educated  Patient    Methods Demonstration;Explanation;Tactile cues;Verbal cues    Comprehension Verbalized understanding;Returned demonstration;Verbal cues required;Tactile cues required;Need further instruction            PT Short Term Goals - 04/15/21 1000      PT SHORT TERM GOAL #1   Title Be independent with initial home exercise program for self-management of symptoms.    Baseline Initial HEP to be established at visit 2 as appropriate (03/13/2021); Initial HEP established visit 2 (03/18/2021);    Time 3    Period Weeks    Status Achieved    Target Date 04/03/21             PT Long Term Goals - 04/15/21 1001      PT LONG TERM GOAL #1   Title Be independent with a long-term home exercise program for self-management of symptoms.    Baseline Initial HEP to be established at visit 2 as appropriate (03/13/2021); Initial HEP provided visit 2 (03/18/2021);    Time 12    Period Weeks    Status Partially Met    Target Date 06/01/21      PT LONG TERM GOAL #2   Title Demonstrate improved FOTO score by 10 units to demonstrate improvement in overall condition and self-reported functional ability.    Baseline to be measured vsiti 2 as appropriate (03/13/2021); 51 (03/18/2021); 51 (04/15/21)    Time 12    Period Weeks    Status On-going    Target Date 06/05/21      PT LONG TERM GOAL #3   Title Patient will score equal or greater than 25/30 on Functional Gait Assessment to demonstrate reduction in fall risk to low fall risk classification.    Baseline to be measured vsiti 2 as appropriate (03/13/2021); 20/30 moderate fall risk (03/18/2021);    Time 12    Period Weeks    Status On-going    Target Date 06/01/21      PT LONG TERM GOAL #4   Title Patient will ambulate equal or greater than 1000 feet during 6 minute walk test to demonstrate improved community mobility.    Baseline to be measured vsiti 2 as appropriate (03/13/2021); 820 feet with SPC(03/18/2021); 1240f 04/15/21 (4/10 back pain peak  that resolves upon rest.    Time 12    Period Weeks    Status Achieved    Target Date 06/01/21      PT LONG TERM GOAL #5   Title Complete community, work and/or recreational activities with 50% less limitation due to current condition.    Baseline Functional Limitations: difficulty with activities that require  standing balance including outside activities, taking the puppy out, prolonged standing, household chores, community ambulation, grooming, climbing stairs, yard work, sleeping, fall risk, etc. cannot do what he wants to outside because he is afraid to fall (03/13/2021);    Time 12    Period Weeks    Status On-going    Target Date 06/01/21                 Plan - 05/01/21 1220    Clinical Impression Statement Patient tolerated treatment well and continues to demonstrate improved functional mobility and balance. Does continue to have more difficulty with LE unsteadiness and knee flexion when tired but appears to be improving ability to perform some of his clinic activities at home. Would benefit from updated HEP next session to move towards more independent management of condition. Patient would benefit from continued management of limiting condition by skilled physical therapist to address remaining impairments and functional limitations to work towards stated goals and return to PLOF or maximal functional independence.    Personal Factors and Comorbidities Age;Comorbidity 3+;Past/Current Experience;Fitness;Time since onset of injury/illness/exacerbation    Comorbidities Relevant past medical history and comorbidities include seizure disorder (controlled with meds), hx R ankle fracture (wears an air brace to prevent pain for years), ACDF (05/2018 for myelopathy), R  Carpal tunnel release (06/2019), BPH, symptoms of peripheral neuropathy at all limbs (fine motor skills in B UE affected), CTS, perfuse degenerative changes on lumbar and cervical spine imaging (see chart for details).     Examination-Activity Limitations Bend;Locomotion Level;Stand;Carry;Sleep;Hygiene/Grooming;Caring for Others;Lift;Stairs;Squat    Examination-Participation Restrictions Shop;Community Activity;Meal Prep;Yard Work    [employer] Evolving/Moderate complexity    Rehab Potential Fair    PT Frequency 2x / week    PT Duration 12 weeks    PT Treatment/Interventions ADLs/Self Care Home Management;Cryotherapy;Traction;Moist Heat;Electrical Stimulation;DME Instruction;Gait training;Stair training;Functional mobility training;Therapeutic activities;Therapeutic exercise;Balance training;Neuromuscular re-education;Patient/family education;Passive range of motion;Dry needling;Manual techniques;Joint Manipulations;Spinal Manipulations    PT Next Visit Plan balance training, LE strength/power    PT Home Exercise Plan Medbridge Access Code: [email or web address]    Consulted and Agree with Plan of Care Patient           Patient will benefit from skilled therapeutic intervention in order to improve the following deficits and impairments:  Abnormal gait,Decreased knowledge of use of DME,Impaired sensation,Improper body mechanics,Pain,Decreased coordination,Decreased mobility,Postural dysfunction,Decreased activity tolerance,Decreased endurance,Decreased range of motion,Decreased strength,Hypomobility,Impaired perceived functional ability,Impaired UE functional use,Decreased balance,Difficulty walking,Impaired flexibility  Visit Diagnosis: Unsteadiness on feet  Chronic bilateral low back pain, unspecified whether sciatica present  Difficulty in walking, not elsewhere classified  History of falling     Problem List Patient Active Problem List   Diagnosis Date Noted  . Hearing loss due to cerumen impaction 03/01/2021  . Atypical chest pain 07/10/2020  . 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 Unsteadiness on feet 11/08/2019  . History of spinal surgery 01/17/2019  . Neck pain 01/17/2019  . Weakness  of hand 01/17/2019  . Benign prostatic hyperplasia with urinary obstruction 01/17/2019  . Systolic murmur 80/99/8338  . Right carpal tunnel syndrome 02/02/2018  . Ulnar neuropathy at elbow, left 02/02/2018  . Peripheral neuropathy 01/26/2018  . Bilateral buttock pain 06/29/2016  . Failed vision screen 06/29/2016  . Advanced care planning/counseling discussion 06/21/2015  . Medicare annual wellness visit, subsequent 06/18/2014  . Health maintenance examination 06/18/2014  . Chronic ankle pain 06/18/2014  . Incomplete emptying of bladder 06/07/2013  . Vitamin D deficiency 05/20/2013  . THYROID NODULE  05/17/2007  . HLD (hyperlipidemia) 05/17/2007  . TREMOR, ESSENTIAL 05/17/2007  . Essential hypertension 05/17/2007  . Cervical spondylosis with myelopathy and radiculopathy 05/17/2007  . Seizure disorder ([place]) 05/17/2007    Everlean Alstrom. Graylon Good, PT, DPT 05/01/21, 12:22 PM  [facility] [address] [place], [address] Phone: [phone]   Fax:  [phone]  Name: IGNATZ DEIS MRN: [id number] Date of Birth: [birth year]

## 2021-05-06 ENCOUNTER — Other Ambulatory Visit: Payer: Self-pay

## 2021-05-06 ENCOUNTER — Ambulatory Visit: Payer: Medicare Other | Admitting: Physical Therapy

## 2021-05-06 DIAGNOSIS — R2681 Unsteadiness on feet: Secondary | ICD-10-CM

## 2021-05-06 DIAGNOSIS — R262 Difficulty in walking, not elsewhere classified: Secondary | ICD-10-CM

## 2021-05-06 DIAGNOSIS — M545 Low back pain, unspecified: Secondary | ICD-10-CM

## 2021-05-06 DIAGNOSIS — Z9181 History of falling: Secondary | ICD-10-CM

## 2021-05-06 DIAGNOSIS — G8929 Other chronic pain: Secondary | ICD-10-CM

## 2021-05-06 NOTE — Therapy (Signed)
[facility] [address] [place], [address] Phone: [phone]   Fax:  [phone]  Physical Therapy Treatment  Patient Details  Name: Mark Holmes MRN: [id number] Date of Birth: 1935/05/30 Referring Provider (PT): Allene Dillon, NP   Encounter Date: 05/06/2021   PT End of Session - 05/06/21 1117    Visit Number 15    Number of Visits 24    Date for PT Re-Evaluation 06/05/21    Authorization Type [employer] MEDICARE reporting period from 04/15/2021    Authorization Time Period 03/13/21-06/05/21    Progress Note Due on Visit 20    PT Start Time 1026    PT Stop Time 1115    PT Time Calculation (min) 49 min    Equipment Utilized During Treatment Gait belt    Activity Tolerance Patient tolerated treatment well;Patient limited by fatigue;Patient limited by pain    Behavior During Therapy [facility] for tasks assessed/performed           Past Medical History:  Diagnosis Date  . Benign prostatic hypertrophy with elevated PSA    per Dr. Jacqlyn Larsen  . History of CT scan of brain 1992   normal  . Hyperglycemia 01/2006   102  . Hyperlipemia   . Hypertension   . Seizure disorder [facility])     Past Surgical History:  Procedure Laterality Date  . ANTERIOR CERVICAL DECOMP/DISCECTOMY FUSION  05/2018   for myelopathy -C3/4 ([facility])  . CARPAL TUNNEL RELEASE Right 06/2019   ([facility])  . CATARACT EXTRACTION W/ INTRAOCULAR LENS IMPLANT Right 2012   [facility]  . History of EEG  1985   normal  . hospitalization  1981   observation for seizuare  . TONSILLECTOMY    . TRANSURETHRAL RESECTION OF PROSTATE  10/2005   [name]  . TRANSURETHRAL RESECTION OF PROSTATE  08/2017   (rpt) [facility]    There were no vitals filed for this visit.   Subjective Assessment - 05/06/21 1026    Subjective Patient reports he is feeling well with no pain upon arrival. He was tired but felt good after last session. He just found out  his son in [place] father passed away this morning in the hospital. Arrives carrying [facility].    Pertinent History Patient is a 86 y.o. male who presents to outpatient physical therapy with a referral for medical diagnosis difficulty balancing, lumbar DDD (degenerative disc disease) with request for balance therapy. This patient's chief complaints consist of discomfort of the B LE below the knee, pain in the low back with standing, and difficulty balancing in the setting of chronic R ankle pain, B hand neuropathy symptoms and history of cervical spine disorder with ACDF at C3-4 for myelopathy leading to the following functional deficits: cannot do what he wants to outside because he is afraid to fall, taking the puppy out, prolonged standing, household chores, community ambulation, grooming, climbing stairs, yard work, sleeping, fall risk, etc. .  Relevant past medical history and comorbidities include seizure disorder (controlled with meds), hx R ankle fracture (wears an air brace to prevent pain for years), ACDF (05/2018 for myelopathy), R  Carpal tunnel release (06/2019), BPH, symptoms of peripheral neuropathy at all limbs (fine motor skills in B UE affected), CTS, perfuse degenerative changes on lumbar and cervical spine imaging (see chart for details).    Limitations Standing;Walking;Sitting;Other (comment);House hold activities    How long can you sit comfortably? "a long time' (but will feelit  when he stands up)    How long can you stand comfortably? can finish the task but gets really uncomfortable in the back    Diagnostic tests Cervical spine MRI report 11/29/2020: " IMPRESSION:  1. Neurofibroma of the left C2 nerve as seen previously, maximal  transverse diameter 11-12 mm.  2. Previous ACDF C3-4. Canal stenosis remains at this level with AP  diameter of 7 mm, without worsening. No actual cord compression.  3. Spondylosis and facet arthropathy with degenerative  anterolisthesis of C5-6 and C7-T1. Canal  narrowing but no cord  compression.  4. Foraminal stenosis primarily due to facet osteophytes and  uncovertebral osteophytes that could cause neural compression  bilateral at C2-3, bilateral at C3-4, bilateral at C4-5, bilateral  but left more than right at C5-6, bilateral but left more than right  at C6-7 and bilateral but left more than right at C7-T1." Lumbar spine MRI report 11/29/2020: "IMPRESSION:  1. Degenerative disc disease and degenerative facet disease  throughout the lumbar region which could be associated with back  pain. Thoracolumbar scoliotic curvature.  2. L2-3: Moderate central canal stenosis. Bilateral lateral recess  and foraminal stenosis left worse than right. Neural compression  could occur at this level, particularly on the left.  3. L3-4: Bilateral lateral recess and foraminal stenosis, right  worse than left. Neural compression could occur on either side,  particularly on the right.  4. L4-5: Bilateral lateral recess and foraminal stenosis, right more  than left. Neural compression could occur on either side,  particularly the right.  5. L5-S1: Bilateral foraminal narrowing with some potential to  affect either exiting L5 nerve."    Patient Stated Goals he would like to walk "like normal."    Currently in Pain? No/denies           TREATMENT:  Neuromuscular Re-education:to improve, balance, postural strength, muscle activation patterns, and stabilization strength required for functional activities(CGA - min A as needed for safety): - standing static balance: eyesopensemi-tandem stance on firm surface, rotating head back and forth horizontally,1x20 each side.  - alternating mini forward lunges with UE support as needed, 2x10 each side.  - alternating mini lateral lunge with B UE as needed, 1x20 each side.  - alternating double toe taps on cone with UE as needed, 1x20 each side.  - forward step/push back over cone with UE support 1x10 each side (does not step all the way  over cone but does lift foot higher).  - side step/push back over cone with UE uspport, 1x10 each side (does not step all the way over cone but does lift foot higher).   Therapeutic exercise: to centralize symptoms and improve ROM, strength, muscular endurance, and activity tolerance required for successful completion of functional activities.  - sit to stand jump 3x10 from 17 inch chair.  - jogging in place x 20 each side - jogging forwards and backwards, 3x25 feet each direction (difficult) - standing B heel raises off edge of 2x4 board with BUE support, 2x10 (cuing for still hips). - Education on HEP including handout and review of each exercise with patient performing/explaining a few reps of each following handout for directions.  Pt required multimodal cuing for proper technique and to facilitate improved neuromuscular control, strength, range of motion, and functional ability resulting in improved performance and form.  HOME EXERCISE PROGRAM Access Code: [email or web address] URL: [email or web address] Date: 05/06/2021 Prepared by: Rosita Kea  Exercises Sit to Stand with Arm Reach and Jump - 2-3 x weekly -  3 sets - 10 reps Half Tandem Stance Balance with Head Rotation - 2-3 x weekly - 1 sets - 20 reps   [email or web address] CG [id number] Home Exercise Program [[id number]]  LUNGE with something to hold onto beside you.  -  Repeat 10 Times, Complete 2 Sets, Perform 3 Times a Week  MINI LATERAL LUNGE -  Repeat 20 Times, Complete 1 Set, Perform 3 Times a Week  CUP - TOE TAP AND HOLD -  Repeat 20 Times, Complete 1 Set, Perform 3 Times a Week  JOGGING IN PLACE -  Duration 20 Seconds, Complete 3 Sets, Perform 3 Times a Week  Incline Heel Raises PF -  Repeat 10 Times, Complete 3 Sets, Perform 3 Times a Week    PT Education - 05/06/21 1113    Education Details exercise purpose/form. self-management technique    Person(s) Educated Patient    Methods Explanation;Demonstration;Tactile  cues;Verbal cues    Comprehension Verbalized understanding;Returned demonstration;Verbal cues required;Tactile cues required;Need further instruction            PT Short Term Goals - 04/15/21 1000      PT SHORT TERM GOAL #1   Title Be independent with initial home exercise program for self-management of symptoms.    Baseline Initial HEP to be established at visit 2 as appropriate (03/13/2021); Initial HEP established visit 2 (03/18/2021);    Time 3    Period Weeks    Status Achieved    Target Date 04/03/21             PT Long Term Goals - 04/15/21 1001      PT LONG TERM GOAL #1   Title Be independent with a long-term home exercise program for self-management of symptoms.    Baseline Initial HEP to be established at visit 2 as appropriate (03/13/2021); Initial HEP provided visit 2 (03/18/2021);    Time 12    Period Weeks    Status Partially Met    Target Date 06/01/21      PT LONG TERM GOAL #2   Title Demonstrate improved FOTO score by 10 units to demonstrate improvement in overall condition and self-reported functional ability.    Baseline to be measured vsiti 2 as appropriate (03/13/2021); 51 (03/18/2021); 51 (04/15/21)    Time 12    Period Weeks    Status On-going    Target Date 06/05/21      PT LONG TERM GOAL #3   Title Patient will score equal or greater than 25/30 on Functional Gait Assessment to demonstrate reduction in fall risk to low fall risk classification.    Baseline to be measured vsiti 2 as appropriate (03/13/2021); 20/30 moderate fall risk (03/18/2021);    Time 12    Period Weeks    Status On-going    Target Date 06/01/21      PT LONG TERM GOAL #4   Title Patient will ambulate equal or greater than 1000 feet during 6 minute walk test to demonstrate improved community mobility.    Baseline to be measured vsiti 2 as appropriate (03/13/2021); 820 feet with SPC(03/18/2021); 1276f 04/15/21 (4/10 back pain peak that resolves upon rest.    Time 12    Period Weeks     Status Achieved    Target Date 06/01/21      PT LONG TERM GOAL #5   Title Complete community, work and/or recreational activities with 50% less limitation due to current condition.    Baseline Functional Limitations: difficulty with activities that require  standing balance including outside activities, taking the puppy out, prolonged standing, household chores, community ambulation, grooming, climbing stairs, yard work, sleeping, fall risk, etc. cannot do what he wants to outside because he is afraid to fall (03/13/2021);    Time 12    Period Weeks    Status On-going    Target Date 06/01/21                 Plan - 05/06/21 1113    Clinical Impression Statement Patient tolerated treatment well overall with some difficulty due to right ankle discomfort, but he continued to want to complete activities. Today's session focused on updating HEP to encourage transition to more independent care. Patient demonstrated ability to complete exercises adequately and voiced understanding about precautions to be taken to prevent falls at home. Patient would benefit from continued management of limiting condition by skilled physical therapist to address remaining impairments and functional limitations to work towards stated goals and return to PLOF or maximal functional independence    Personal Factors and Comorbidities Age;Comorbidity 3+;Past/Current Experience;Fitness;Time since onset of injury/illness/exacerbation    Comorbidities Relevant past medical history and comorbidities include seizure disorder (controlled with meds), hx R ankle fracture (wears an air brace to prevent pain for years), ACDF (05/2018 for myelopathy), R  Carpal tunnel release (06/2019), BPH, symptoms of peripheral neuropathy at all limbs (fine motor skills in B UE affected), CTS, perfuse degenerative changes on lumbar and cervical spine imaging (see chart for details).    Examination-Activity Limitations Bend;Locomotion  Level;Stand;Carry;Sleep;Hygiene/Grooming;Caring for Others;Lift;Stairs;Squat    Examination-Participation Restrictions Shop;Community Activity;Meal Prep;Yard Work    [employer] Evolving/Moderate complexity    Rehab Potential Fair    PT Frequency 2x / week    PT Duration 12 weeks    PT Treatment/Interventions ADLs/Self Care Home Management;Cryotherapy;Traction;Moist Heat;Electrical Stimulation;DME Instruction;Gait training;Stair training;Functional mobility training;Therapeutic activities;Therapeutic exercise;Balance training;Neuromuscular re-education;Patient/family education;Passive range of motion;Dry needling;Manual techniques;Joint Manipulations;Spinal Manipulations    PT Next Visit Plan balance training, LE strength/power    PT Home Exercise Plan Medbridge Access Code: [email or web address]    Consulted and Agree with Plan of Care Patient           Patient will benefit from skilled therapeutic intervention in order to improve the following deficits and impairments:  Abnormal gait,Decreased knowledge of use of DME,Impaired sensation,Improper body mechanics,Pain,Decreased coordination,Decreased mobility,Postural dysfunction,Decreased activity tolerance,Decreased endurance,Decreased range of motion,Decreased strength,Hypomobility,Impaired perceived functional ability,Impaired UE functional use,Decreased balance,Difficulty walking,Impaired flexibility  Visit Diagnosis: Unsteadiness on feet  Chronic bilateral low back pain, unspecified whether sciatica present  Difficulty in walking, not elsewhere classified  History of falling     Problem List Patient Active Problem List   Diagnosis Date Noted  . Hearing loss due to cerumen impaction 03/01/2021  . Atypical chest pain 07/10/2020  . 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 Unsteadiness on feet 11/08/2019  . History of spinal surgery 01/17/2019  . Neck pain 01/17/2019  . Weakness of hand 01/17/2019  . Benign prostatic  hyperplasia with urinary obstruction 01/17/2019  . Systolic murmur 00/37/0488  . Right carpal tunnel syndrome 02/02/2018  . Ulnar neuropathy at elbow, left 02/02/2018  . Peripheral neuropathy 01/26/2018  . Bilateral buttock pain 06/29/2016  . Failed vision screen 06/29/2016  . Advanced care planning/counseling discussion 06/21/2015  . Medicare annual wellness visit, subsequent 06/18/2014  . Health maintenance examination 06/18/2014  . Chronic ankle pain 06/18/2014  . Incomplete emptying of bladder 06/07/2013  . Vitamin D deficiency 05/20/2013  . THYROID NODULE 05/17/2007  .  HLD (hyperlipidemia) 05/17/2007  . TREMOR, ESSENTIAL 05/17/2007  . Essential hypertension 05/17/2007  . Cervical spondylosis with myelopathy and radiculopathy 05/17/2007  . Seizure disorder ([place]) 05/17/2007    Everlean Alstrom. Graylon Good, PT, DPT 05/06/21, 11:25 AM  [facility] [address] [place], [address] Phone: [phone]   Fax:  [phone]  Name: Mark Holmes MRN: [id number] Date of Birth: 1935/02/08

## 2021-05-08 ENCOUNTER — Other Ambulatory Visit: Payer: Self-pay | Admitting: Family Medicine

## 2021-05-08 ENCOUNTER — Ambulatory Visit: Payer: Medicare Other | Admitting: Physical Therapy

## 2021-05-08 ENCOUNTER — Encounter: Payer: Self-pay | Admitting: Physical Therapy

## 2021-05-08 ENCOUNTER — Other Ambulatory Visit: Payer: Self-pay

## 2021-05-08 DIAGNOSIS — R262 Difficulty in walking, not elsewhere classified: Secondary | ICD-10-CM

## 2021-05-08 DIAGNOSIS — G8929 Other chronic pain: Secondary | ICD-10-CM

## 2021-05-08 DIAGNOSIS — Z9181 History of falling: Secondary | ICD-10-CM

## 2021-05-08 DIAGNOSIS — M545 Low back pain, unspecified: Secondary | ICD-10-CM

## 2021-05-08 DIAGNOSIS — R2681 Unsteadiness on feet: Secondary | ICD-10-CM

## 2021-05-08 NOTE — Therapy (Signed)
[facility] [address] [place], [address] Phone: [phone]   Fax:  [phone]  Physical Therapy Treatment  Patient Details  Name: Mark Holmes MRN: [id number] Date of Birth: [birth year] Referring Provider (PT): Allene Dillon, NP   Encounter Date: 05/08/2021   PT End of Session - 05/08/21 0908    Visit Number 16    Number of Visits 24    Date for PT Re-Evaluation 06/05/21    Authorization Type [employer] MEDICARE reporting period from 04/15/2021    Authorization Time Period 03/13/21-06/05/21    Progress Note Due on Visit 20    PT Start Time 0901    PT Stop Time 0943    PT Time Calculation (min) 42 min    Equipment Utilized During Treatment Gait belt    Activity Tolerance Patient tolerated treatment well;Patient limited by fatigue;Patient limited by pain    Behavior During Therapy [facility] for tasks assessed/performed           Past Medical History:  Diagnosis Date  . Benign prostatic hypertrophy with elevated PSA    per Dr. Jacqlyn Larsen  . History of CT scan of brain 1992   normal  . Hyperglycemia 01/2006   102  . Hyperlipemia   . Hypertension   . Seizure disorder [facility])     Past Surgical History:  Procedure Laterality Date  . ANTERIOR CERVICAL DECOMP/DISCECTOMY FUSION  05/2018   for myelopathy -C3/4 ([facility])  . CARPAL TUNNEL RELEASE Right 06/2019   ([facility])  . CATARACT EXTRACTION W/ INTRAOCULAR LENS IMPLANT Right 2012   [facility]  . History of EEG  1985   normal  . hospitalization  1981   observation for seizuare  . TONSILLECTOMY    . TRANSURETHRAL RESECTION OF PROSTATE  10/2005   [name]  . TRANSURETHRAL RESECTION OF PROSTATE  08/2017   (rpt) [facility]    There were no vitals filed for this visit.   Subjective Assessment - 05/08/21 0902    Subjective Pateint reports he noticed this morning that doing firgure 4 hip stretch in bed really brought on his lower leg symptoms, R > L.  It is the same feeling he gets when he sits at his computer for too long and can feel it in his lower legs. Arrives carrying [facility]. Reports no pain currently. He does think physical therapy has helped his back pain when he gets up in the morning. He used to get leg pain at 11 at night but no longer feels this. He was attempting to use the extended trimmer to trim some tree branches and he felt okay like he was not going to fall over. Patient worked on his HEP at home and found the toe tapping exercise very difficult.    Pertinent History Patient is a 86 y.o. male who presents to outpatient physical therapy with a referral for medical diagnosis difficulty balancing, lumbar DDD (degenerative disc disease) with request for balance therapy. This patient's chief complaints consist of discomfort of the B LE below the knee, pain in the low back with standing, and difficulty balancing in the setting of chronic R ankle pain, B hand neuropathy symptoms and history of cervical spine disorder with ACDF at C3-4 for myelopathy leading to the following functional deficits: cannot do what he wants to outside because he is afraid to fall, taking the puppy out, prolonged standing, household chores, community ambulation, grooming, climbing stairs, yard work, sleeping, fall risk, etc. .  Relevant past medical history and comorbidities include seizure disorder (controlled with meds), hx R ankle fracture (wears an air brace to prevent pain for years), ACDF (05/2018 for myelopathy), R  Carpal tunnel release (06/2019), BPH, symptoms of peripheral neuropathy at all limbs (fine motor skills in B UE affected), CTS, perfuse degenerative changes on lumbar and cervical spine imaging (see chart for details).    Limitations Standing;Walking;Sitting;Other (comment);House hold activities    How long can you sit comfortably? "a long time' (but will feelit when he stands up)    How long can you stand comfortably? can finish the task but gets really  uncomfortable in the back    Diagnostic tests Cervical spine MRI report 11/29/2020: " IMPRESSION:  1. Neurofibroma of the left C2 nerve as seen previously, maximal  transverse diameter 11-12 mm.  2. Previous ACDF C3-4. Canal stenosis remains at this level with AP  diameter of 7 mm, without worsening. No actual cord compression.  3. Spondylosis and facet arthropathy with degenerative  anterolisthesis of C5-6 and C7-T1. Canal narrowing but no cord  compression.  4. Foraminal stenosis primarily due to facet osteophytes and  uncovertebral osteophytes that could cause neural compression  bilateral at C2-3, bilateral at C3-4, bilateral at C4-5, bilateral  but left more than right at C5-6, bilateral but left more than right  at C6-7 and bilateral but left more than right at C7-T1." Lumbar spine MRI report 11/29/2020: "IMPRESSION:  1. Degenerative disc disease and degenerative facet disease  throughout the lumbar region which could be associated with back  pain. Thoracolumbar scoliotic curvature.  2. L2-3: Moderate central canal stenosis. Bilateral lateral recess  and foraminal stenosis left worse than right. Neural compression  could occur at this level, particularly on the left.  3. L3-4: Bilateral lateral recess and foraminal stenosis, right  worse than left. Neural compression could occur on either side,  particularly on the right.  4. L4-5: Bilateral lateral recess and foraminal stenosis, right more  than left. Neural compression could occur on either side,  particularly the right.  5. L5-S1: Bilateral foraminal narrowing with some potential to  affect either exiting L5 nerve."    Patient Stated Goals he would like to walk "like normal."    Effect of Pain on Daily Activities Functional Limitations: difficulty with activities that require standing balance including outside activities, taking the puppy out, prolonged standing, household chores, community ambulation, grooming, climbing stairs, yard work, sleeping, fall  risk, etc. He states he cannot do what he wants to outside because he is afraid to fall. He can do what he wants down on his knees, but not standing up. He has a new puppy and it is hard to keep his balance when she is pulling him (03/13/2021); states the dog doesn't pull him off balance any more and he was able to trim trees with the extended trimmer, back pain in the morning is better (05/08/2021);          OBJECTIVE FOTO = 54 (05/08/2021);  TREATMENT:  Neuromuscular Re-education:to improve, balance, postural strength, muscle activation patterns, and stabilization strength required for functional activities(CGA - min A as needed for safety): - standing static balance: eyesopensemi-tandem stance on firm surface, rotating head back and forth horizontally,2x20 each side (feet closer together 2nd set).  - alternating mini forward lunges with UE support as needed, 2x10 each side.  - alternating mini lateral lunge with B UE as needed, 1x20 each side.  - alternating double toe taps on cone with  UE as needed, 2x10 each side.   Therapeutic exercise:to centralize symptoms and improve ROM, strength, muscular endurance, and activity tolerance required for successful completion of functional activities.  - jogging in place 3x 20 seconds - standing B heel raises off edge of 2x4 board with BUE support, 3x10 (cuing for still hips). - sit to stand jump 3x10 from 17 inch chair.  - jogging forwards x 200 feet.  Pt required multimodal cuing for proper technique and to facilitate improved neuromuscular control, strength, range of motion, and functional ability resulting in improved performance and form.  HOME EXERCISE PROGRAM Access Code: [email or web address] URL: [email or web address] Date: 05/06/2021 Prepared by: Rosita Kea  Exercises Sit to Stand with Arm Reach and Jump - 2-3 x weekly - 3 sets - 10 reps Half Tandem Stance Balance with Head Rotation - 2-3 x weekly - 1 sets - 20 reps   [email or web address] CG [id number] Home Exercise Program [[id number]]  LUNGE with something to hold onto beside you.  -  Repeat 10 Times, Complete 2 Sets, Perform 3 Times a Week  MINI LATERAL LUNGE -  Repeat 20 Times, Complete 1 Set, Perform 3 Times a Week  CUP - TOE TAP AND HOLD -  Repeat 20 Times, Complete 1 Set, Perform 3 Times a Week  JOGGING IN PLACE -  Duration 20 Seconds, Complete 3 Sets, Perform 3 Times a Week  Incline Heel Raises PF -  Repeat 10 Times, Complete 3 Sets, Perform 3 Times a Week     PT Education - 05/08/21 0910    Education Details exercise purpose/form. self-management technique    Person(s) Educated Patient    Methods Explanation;Demonstration;Tactile cues;Verbal cues    Comprehension Verbalized understanding;Returned demonstration;Verbal cues required;Tactile cues required;Need further instruction            PT Short Term Goals - 04/15/21 1000      PT SHORT TERM GOAL #1   Title Be independent with initial home exercise program for self-management of symptoms.    Baseline Initial HEP to be established at visit 2 as appropriate (03/13/2021); Initial HEP established visit 2 (03/18/2021);    Time 3    Period Weeks    Status Achieved    Target Date 04/03/21             PT Long Term Goals - 04/15/21 1001      PT LONG TERM GOAL #1   Title Be independent with a long-term home exercise program for self-management of symptoms.    Baseline Initial HEP to be established at visit 2 as appropriate (03/13/2021); Initial HEP provided visit 2 (03/18/2021);    Time 12    Period Weeks    Status Partially Met    Target Date 06/01/21      PT LONG TERM GOAL #2   Title Demonstrate improved FOTO score by 10 units to demonstrate improvement in overall condition and self-reported functional ability.    Baseline to be measured vsiti 2 as appropriate (03/13/2021); 51 (03/18/2021); 51 (04/15/21)    Time 12    Period Weeks    Status On-going    Target Date 06/05/21      PT LONG  TERM GOAL #3   Title Patient will score equal or greater than 25/30 on Functional Gait Assessment to demonstrate reduction in fall risk to low fall risk classification.    Baseline to be measured vsiti 2 as appropriate (03/13/2021); 20/30 moderate fall risk (03/18/2021);    Time 12    Period Weeks  Status On-going    Target Date 06/01/21      PT LONG TERM GOAL #4   Title Patient will ambulate equal or greater than 1000 feet during 6 minute walk test to demonstrate improved community mobility.    Baseline to be measured vsiti 2 as appropriate (03/13/2021); 820 feet with SPC(03/18/2021); 1257f 04/15/21 (4/10 back pain peak that resolves upon rest.    Time 12    Period Weeks    Status Achieved    Target Date 06/01/21      PT LONG TERM GOAL #5   Title Complete community, work and/or recreational activities with 50% less limitation due to current condition.    Baseline Functional Limitations: difficulty with activities that require standing balance including outside activities, taking the puppy out, prolonged standing, household chores, community ambulation, grooming, climbing stairs, yard work, sleeping, fall risk, etc. cannot do what he wants to outside because he is afraid to fall (03/13/2021);    Time 12    Period Weeks    Status On-going    Target Date 06/01/21                 Plan - 05/08/21 0933    Clinical Impression Statement Continued to review HEP for improved independence and practice with improved static and dynamic balance, as well as improved coordination and strength. Continues to demonstrate improvements in activity tolerance and balance but requires cuing and guarding for safety. Patient would benefit from continued management of limiting condition by skilled physical therapist to address remaining impairments and functional limitations to work towards stated goals and return to PLOF or maximal functional independence.    Personal Factors and Comorbidities Age;Comorbidity  3+;Past/Current Experience;Fitness;Time since onset of injury/illness/exacerbation    Comorbidities Relevant past medical history and comorbidities include seizure disorder (controlled with meds), hx R ankle fracture (wears an air brace to prevent pain for years), ACDF (05/2018 for myelopathy), R  Carpal tunnel release (06/2019), BPH, symptoms of peripheral neuropathy at all limbs (fine motor skills in B UE affected), CTS, perfuse degenerative changes on lumbar and cervical spine imaging (see chart for details).    Examination-Activity Limitations Bend;Locomotion Level;Stand;Carry;Sleep;Hygiene/Grooming;Caring for Others;Lift;Stairs;Squat    Examination-Participation Restrictions Shop;Community Activity;Meal Prep;Yard Work    SMerchant navy officerEvolving/Moderate complexity    Rehab Potential Fair    PT Frequency 2x / week    PT Duration 12 weeks    PT Treatment/Interventions ADLs/Self Care Home Management;Cryotherapy;Traction;Moist Heat;Electrical Stimulation;DME Instruction;Gait training;Stair training;Functional mobility training;Therapeutic activities;Therapeutic exercise;Balance training;Neuromuscular re-education;Patient/family education;Passive range of motion;Dry needling;Manual techniques;Joint Manipulations;Spinal Manipulations    PT Next Visit Plan balance training, LE strength/power    PT Home Exercise Plan Medbridge Access Code: [email or web address]    Consulted and Agree with Plan of Care Patient           Patient will benefit from skilled therapeutic intervention in order to improve the following deficits and impairments:  Abnormal gait,Decreased knowledge of use of DME,Impaired sensation,Improper body mechanics,Pain,Decreased coordination,Decreased mobility,Postural dysfunction,Decreased activity tolerance,Decreased endurance,Decreased range of motion,Decreased strength,Hypomobility,Impaired perceived functional ability,Impaired UE functional use,Decreased balance,Difficulty  walking,Impaired flexibility  Visit Diagnosis: Unsteadiness on feet  Chronic bilateral low back pain, unspecified whether sciatica present  Difficulty in walking, not elsewhere classified  History of falling     Problem List Patient Active Problem List   Diagnosis Date Noted  . Hearing loss due to cerumen impaction 03/01/2021  . Atypical chest pain 07/10/2020  . 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 Unsteadiness on feet 11/08/2019  .  History of spinal surgery 01/17/2019  . Neck pain 01/17/2019  . Weakness of hand 01/17/2019  . Benign prostatic hyperplasia with urinary obstruction 01/17/2019  . Systolic murmur 70/17/7939  . Right carpal tunnel syndrome 02/02/2018  . Ulnar neuropathy at elbow, left 02/02/2018  . Peripheral neuropathy 01/26/2018  . Bilateral buttock pain 06/29/2016  . Failed vision screen 06/29/2016  . Advanced care planning/counseling discussion 06/21/2015  . Medicare annual wellness visit, subsequent 06/18/2014  . Health maintenance examination 06/18/2014  . Chronic ankle pain 06/18/2014  . Incomplete emptying of bladder 06/07/2013  . Vitamin D deficiency 05/20/2013  . THYROID NODULE 05/17/2007  . HLD (hyperlipidemia) 05/17/2007  . TREMOR, ESSENTIAL 05/17/2007  . Essential hypertension 05/17/2007  . Cervical spondylosis with myelopathy and radiculopathy 05/17/2007  . Seizure disorder ([place]) 05/17/2007    Mark Holmes 05/08/2021, 9:45 AM  [facility] [address] [place], [address] Phone: [phone]   Fax:  [phone]  Name: Mark Holmes MRN: [id number] Date of Birth: July 29, 1935

## 2021-05-09 NOTE — Telephone Encounter (Signed)
Dilantin Last filled:  03/14/21, #450 Last OV:  02/26/21, 4 mo f/u Next OV:  06/30/21, 4 mo f/u

## 2021-05-12 NOTE — Telephone Encounter (Signed)
ERx 

## 2021-05-15 ENCOUNTER — Encounter: Payer: Self-pay | Admitting: Physical Therapy

## 2021-05-15 ENCOUNTER — Ambulatory Visit: Payer: Medicare Other | Admitting: Physical Therapy

## 2021-05-15 ENCOUNTER — Other Ambulatory Visit: Payer: Self-pay

## 2021-05-15 DIAGNOSIS — M545 Low back pain, unspecified: Secondary | ICD-10-CM

## 2021-05-15 DIAGNOSIS — R262 Difficulty in walking, not elsewhere classified: Secondary | ICD-10-CM

## 2021-05-15 DIAGNOSIS — G8929 Other chronic pain: Secondary | ICD-10-CM

## 2021-05-15 DIAGNOSIS — R2681 Unsteadiness on feet: Secondary | ICD-10-CM | POA: Diagnosis not present

## 2021-05-15 DIAGNOSIS — Z9181 History of falling: Secondary | ICD-10-CM

## 2021-05-15 NOTE — Therapy (Signed)
[facility] [address], [place], [address] Phone: [phone]   Fax:  [phone]  Physical Therapy Treatment  Patient Details  Name: Mark Holmes MRN: [id number] Date of Birth: 07-Dec-1935 Referring Provider (PT): Allene Dillon, NP   Encounter Date: 05/15/2021   PT End of Session - 05/15/21 1024    Visit Number 17    Number of Visits 24    Date for PT Re-Evaluation 06/05/21    Authorization Type [employer] MEDICARE reporting period from 04/15/2021    Authorization Time Period 03/13/21-06/05/21    Progress Note Due on Visit 20    PT Start Time 0900    PT Stop Time 0940    PT Time Calculation (min) 40 min    Equipment Utilized During Treatment Gait belt    Activity Tolerance Patient tolerated treatment well;Patient limited by fatigue;Patient limited by pain    Behavior During Therapy [facility] for tasks assessed/performed           Past Medical History:  Diagnosis Date  . Benign prostatic hypertrophy with elevated PSA    per Dr. Jacqlyn Larsen  . History of CT scan of brain 1992   normal  . Hyperglycemia 01/2006   102  . Hyperlipemia   . Hypertension   . Seizure disorder [facility])     Past Surgical History:  Procedure Laterality Date  . ANTERIOR CERVICAL DECOMP/DISCECTOMY FUSION  05/2018   for myelopathy -C3/4 ([facility])  . CARPAL TUNNEL RELEASE Right 06/2019   ([facility])  . CATARACT EXTRACTION W/ INTRAOCULAR LENS IMPLANT Right 2012   [facility]  . History of EEG  1985   normal  . hospitalization  1981   observation for seizuare  . TONSILLECTOMY    . TRANSURETHRAL RESECTION OF PROSTATE  10/2005   [name]  . TRANSURETHRAL RESECTION OF PROSTATE  08/2017   (rpt) [facility]    There were no vitals filed for this visit.   Subjective Assessment - 05/15/21 0947    Subjective Pateint reports he is feeling okay today. He has been doing his HEP at least 2 times since last session as prescribe     Pertinent History Patient is a 86 y.o. male who presents to outpatient physical therapy with a referral for medical diagnosis difficulty balancing, lumbar DDD (degenerative disc disease) with request for balance therapy. This patient's chief complaints consist of discomfort of the B LE below the knee, pain in the low back with standing, and difficulty balancing in the setting of chronic R ankle pain, B hand neuropathy symptoms and history of cervical spine disorder with ACDF at C3-4 for myelopathy leading to the following functional deficits: cannot do what he wants to outside because he is afraid to fall, taking the puppy out, prolonged standing, household chores, community ambulation, grooming, climbing stairs, yard work, sleeping, fall risk, etc. .  Relevant past medical history and comorbidities include seizure disorder (controlled with meds), hx R ankle fracture (wears an air brace to prevent pain for years), ACDF (05/2018 for myelopathy), R  Carpal tunnel release (06/2019), BPH, symptoms of peripheral neuropathy at all limbs (fine motor skills in B UE affected), CTS, perfuse degenerative changes on lumbar and cervical spine imaging (see chart for details).    Limitations Standing;Walking;Sitting;Other (comment);House hold activities    How long can you sit comfortably? "a long time' (but will feelit when he stands up)    How long can you stand comfortably? can finish the task  but gets really uncomfortable in the back    Diagnostic tests Cervical spine MRI report 11/29/2020: " IMPRESSION:  1. Neurofibroma of the left C2 nerve as seen previously, maximal  transverse diameter 11-12 mm.  2. Previous ACDF C3-4. Canal stenosis remains at this level with AP  diameter of 7 mm, without worsening. No actual cord compression.  3. Spondylosis and facet arthropathy with degenerative  anterolisthesis of C5-6 and C7-T1. Canal narrowing but no cord  compression.  4. Foraminal stenosis primarily due to facet osteophytes and   uncovertebral osteophytes that could cause neural compression  bilateral at C2-3, bilateral at C3-4, bilateral at C4-5, bilateral  but left more than right at C5-6, bilateral but left more than right  at C6-7 and bilateral but left more than right at C7-T1." Lumbar spine MRI report 11/29/2020: "IMPRESSION:  1. Degenerative disc disease and degenerative facet disease  throughout the lumbar region which could be associated with back  pain. Thoracolumbar scoliotic curvature.  2. L2-3: Moderate central canal stenosis. Bilateral lateral recess  and foraminal stenosis left worse than right. Neural compression  could occur at this level, particularly on the left.  3. L3-4: Bilateral lateral recess and foraminal stenosis, right  worse than left. Neural compression could occur on either side,  particularly on the right.  4. L4-5: Bilateral lateral recess and foraminal stenosis, right more  than left. Neural compression could occur on either side,  particularly the right.  5. L5-S1: Bilateral foraminal narrowing with some potential to  affect either exiting L5 nerve."    Patient Stated Goals he would like to walk "like normal."           OBJECTIVE FOTO = 54 (05/08/2021); TREATMENT:  Neuromuscular Re-education:to improve, balance, postural strength, muscle activation patterns, and stabilization strength required for functional activities(CGA - min A as needed for safety): - standing static balance: eyesopensemi-tandemstance on firmsurface, rotating head back and forth horizontally,2x20each side (feet closer together 2nd set).  -alternating mini forward lunges with UE support as needed, 2x10 each side. - alternating mini lateral lunge with B UE as needed, 1x20 each side.  - alternating double toe taps on cone with UE as needed, 2x10 each side.  - education within scope of practice to answer patient's questions about his condition.   Therapeutic exercise:to centralize symptoms and improve ROM,  strength, muscular endurance, and activity tolerance required for successful completion of functional activities. - standing B heel raises off edge of 2x4 board with BUE support, 3x10/15/15 (cuing for still hips). - jogging in place 2x 40/20 seconds - sit to stand jump 3x10 from 17 inch chair.  - jogging forwards x 100 feet, CGA  Pt required multimodal cuing for proper technique and to facilitate improved neuromuscular control, strength, range of motion, and functional ability resulting in improved performance and form.  HOME EXERCISE PROGRAM Access Code: [email or web address] URL: [email or web address] Date: 05/06/2021 Prepared by: Rosita Kea  Exercises Sit to Stand with Arm Reach and Jump - 2-3 x weekly - 3 sets - 10 reps Half Tandem Stance Balance with Head Rotation - 2-3 x weekly - 1 sets - 20 reps [email or web address] CG [id number] Home Exercise Program [[id number]]  LUNGE with something to hold onto beside you. - Repeat 10 Times, Complete 2 Sets, Perform 3 Times a Week  MINI LATERAL LUNGE - Repeat 20 Times, Complete 1 Set, Perform 3 Times a Week  CUP - TOE TAP AND HOLD - Repeat 20 Times, Complete 1 Set, Perform 3 Times a Week  JOGGING IN PLACE - Duration 20 Seconds, Complete 3 Sets, Perform 3 Times a Week  Incline Heel Raises PF - Repeat 10 Times, Complete 3 Sets, Perform 3 Times a Week    PT Education - 05/15/21 1025    Education Details exercise purpose/form. self-management technique    Person(s) Educated Patient    Methods Explanation;Demonstration;Tactile cues;Verbal cues    Comprehension Verbalized understanding;Returned demonstration;Verbal cues required;Tactile cues required;Need further instruction            PT Short Term Goals - 04/15/21 1000      PT SHORT TERM GOAL #1   Title Be independent with initial home exercise program for self-management of symptoms.    Baseline Initial HEP to be established at visit 2 as appropriate (03/13/2021); Initial HEP  established visit 2 (03/18/2021);    Time 3    Period Weeks    Status Achieved    Target Date 04/03/21             PT Long Term Goals - 04/15/21 1001      PT LONG TERM GOAL #1   Title Be independent with a long-term home exercise program for self-management of symptoms.    Baseline Initial HEP to be established at visit 2 as appropriate (03/13/2021); Initial HEP provided visit 2 (03/18/2021);    Time 12    Period Weeks    Status Partially Met    Target Date 06/01/21      PT LONG TERM GOAL #2   Title Demonstrate improved FOTO score by 10 units to demonstrate improvement in overall condition and self-reported functional ability.    Baseline to be measured vsiti 2 as appropriate (03/13/2021); 51 (03/18/2021); 51 (04/15/21)    Time 12    Period Weeks    Status On-going    Target Date 06/05/21      PT LONG TERM GOAL #3   Title Patient will score equal or greater than 25/30 on Functional Gait Assessment to demonstrate reduction in fall risk to low fall risk classification.    Baseline to be measured vsiti 2 as appropriate (03/13/2021); 20/30 moderate fall risk (03/18/2021);    Time 12    Period Weeks    Status On-going    Target Date 06/01/21      PT LONG TERM GOAL #4   Title Patient will ambulate equal or greater than 1000 feet during 6 minute walk test to demonstrate improved community mobility.    Baseline to be measured vsiti 2 as appropriate (03/13/2021); 820 feet with SPC(03/18/2021); 1262f 04/15/21 (4/10 back pain peak that resolves upon rest.    Time 12    Period Weeks    Status Achieved    Target Date 06/01/21      PT LONG TERM GOAL #5   Title Complete community, work and/or recreational activities with 50% less limitation due to current condition.    Baseline Functional Limitations: difficulty with activities that require standing balance including outside activities, taking the puppy out, prolonged standing, household chores, community ambulation, grooming, climbing stairs,  yard work, sleeping, fall risk, etc. cannot do what he wants to outside because he is afraid to fall (03/13/2021);    Time 12    Period Weeks    Status On-going    Target Date 06/01/21                 Plan - 05/15/21 0946    Clinical Impression Statement Patient tolerated treatment well overall with some difficulty due  to fatigue, incoordination, and discomfort at the right ankle. Continues to be appropriately challenged by balance, strength, power, and endurance exercises. Patient would benefit from continued management of limiting condition by skilled physical therapist to address remaining impairments and functional limitations to work towards stated goals and return to PLOF or maximal functional independence. Patient would benefit from continued management of limiting condition by skilled physical therapist to address remaining impairments and functional limitations to work towards stated goals and return to PLOF or maximal functional independence.    Personal Factors and Comorbidities Age;Comorbidity 3+;Past/Current Experience;Fitness;Time since onset of injury/illness/exacerbation    Comorbidities Relevant past medical history and comorbidities include seizure disorder (controlled with meds), hx R ankle fracture (wears an air brace to prevent pain for years), ACDF (05/2018 for myelopathy), R  Carpal tunnel release (06/2019), BPH, symptoms of peripheral neuropathy at all limbs (fine motor skills in B UE affected), CTS, perfuse degenerative changes on lumbar and cervical spine imaging (see chart for details).    Examination-Activity Limitations Bend;Locomotion Level;Stand;Carry;Sleep;Hygiene/Grooming;Caring for Others;Lift;Stairs;Squat    Examination-Participation Restrictions Shop;Community Activity;Meal Prep;Yard Work    [employer] Evolving/Moderate complexity    Rehab Potential Fair    PT Frequency 2x / week    PT Duration 12 weeks    PT Treatment/Interventions  ADLs/Self Care Home Management;Cryotherapy;Traction;Moist Heat;Electrical Stimulation;DME Instruction;Gait training;Stair training;Functional mobility training;Therapeutic activities;Therapeutic exercise;Balance training;Neuromuscular re-education;Patient/family education;Passive range of motion;Dry needling;Manual techniques;Joint Manipulations;Spinal Manipulations    PT Next Visit Plan balance training, LE strength/power    PT Home Exercise Plan Medbridge Access Code: [email or web address]    Consulted and Agree with Plan of Care Patient           Patient will benefit from skilled therapeutic intervention in order to improve the following deficits and impairments:  Abnormal gait,Decreased knowledge of use of DME,Impaired sensation,Improper body mechanics,Pain,Decreased coordination,Decreased mobility,Postural dysfunction,Decreased activity tolerance,Decreased endurance,Decreased range of motion,Decreased strength,Hypomobility,Impaired perceived functional ability,Impaired UE functional use,Decreased balance,Difficulty walking,Impaired flexibility  Visit Diagnosis: Unsteadiness on feet  Chronic bilateral low back pain, unspecified whether sciatica present  Difficulty in walking, not elsewhere classified  History of falling     Problem List Patient Active Problem List   Diagnosis Date Noted  . Hearing loss due to cerumen impaction 03/01/2021  . Atypical chest pain 07/10/2020  . 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 Unsteadiness on feet 11/08/2019  . History of spinal surgery 01/17/2019  . Neck pain 01/17/2019  . Weakness of hand 01/17/2019  . Benign prostatic hyperplasia with urinary obstruction 01/17/2019  . Systolic murmur 22/29/7989  . Right carpal tunnel syndrome 02/02/2018  . Ulnar neuropathy at elbow, left 02/02/2018  . Peripheral neuropathy 01/26/2018  . Bilateral buttock pain 06/29/2016  . Failed vision screen 06/29/2016  . Advanced care planning/counseling discussion 06/21/2015  .  Medicare annual wellness visit, subsequent 06/18/2014  . Health maintenance examination 06/18/2014  . Chronic ankle pain 06/18/2014  . Incomplete emptying of bladder 06/07/2013  . Vitamin D deficiency 05/20/2013  . THYROID NODULE 05/17/2007  . HLD (hyperlipidemia) 05/17/2007  . TREMOR, ESSENTIAL 05/17/2007  . Essential hypertension 05/17/2007  . Cervical spondylosis with myelopathy and radiculopathy 05/17/2007  . Seizure disorder ([facility]) 05/17/2007    Everlean Alstrom. Graylon Good, PT, DPT 05/15/21, 10:26 AM  [facility] [address] [place], [address] Phone: [phone]   Fax:  [phone]  Name: Mark Holmes MRN: [id number] Date of Birth: July 19, 1935

## 2021-05-21 ENCOUNTER — Encounter: Payer: Medicare Other | Admitting: Physical Therapy

## 2021-05-21 ENCOUNTER — Ambulatory Visit: Payer: Medicare Other | Admitting: Physical Therapy

## 2021-05-22 ENCOUNTER — Encounter: Payer: Self-pay | Admitting: Physical Therapy

## 2021-05-22 ENCOUNTER — Other Ambulatory Visit: Payer: Self-pay

## 2021-05-22 ENCOUNTER — Ambulatory Visit: Payer: Medicare Other | Attending: Family Medicine | Admitting: Physical Therapy

## 2021-05-22 ENCOUNTER — Ambulatory Visit: Payer: Medicare Other | Admitting: Physical Therapy

## 2021-05-22 DIAGNOSIS — R262 Difficulty in walking, not elsewhere classified: Secondary | ICD-10-CM

## 2021-05-22 DIAGNOSIS — M545 Low back pain, unspecified: Secondary | ICD-10-CM | POA: Diagnosis present

## 2021-05-22 DIAGNOSIS — Z9181 History of falling: Secondary | ICD-10-CM | POA: Diagnosis present

## 2021-05-22 DIAGNOSIS — G8929 Other chronic pain: Secondary | ICD-10-CM | POA: Insufficient documentation

## 2021-05-22 DIAGNOSIS — R2681 Unsteadiness on feet: Secondary | ICD-10-CM | POA: Insufficient documentation

## 2021-05-22 NOTE — Therapy (Signed)
[facility] [address] [place], [address] Phone: [phone]   Fax:  [phone]  Physical Therapy Treatment  Patient Details  Name: Mark Holmes MRN: [id number] Date of Birth: Jun 13, 1935 Referring Provider (PT): Allene Dillon, NP   Encounter Date: 05/22/2021   PT End of Session - 05/22/21 1307    Visit Number 18    Number of Visits 24    Date for PT Re-Evaluation 06/05/21    Authorization Type [employer] MEDICARE reporting period from 04/15/2021    Authorization Time Period 03/13/21-06/05/21    Progress Note Due on Visit 20    PT Start Time 1300    PT Stop Time 1340    PT Time Calculation (min) 40 min    Equipment Utilized During Treatment Gait belt    Activity Tolerance Patient tolerated treatment well;Patient limited by fatigue;Patient limited by pain    Behavior During Therapy [facility] for tasks assessed/performed           Past Medical History:  Diagnosis Date  . Benign prostatic hypertrophy with elevated PSA    per Dr. Jacqlyn Larsen  . History of CT scan of brain 1992   normal  . Hyperglycemia 01/2006   102  . Hyperlipemia   . Hypertension   . Seizure disorder [facility])     Past Surgical History:  Procedure Laterality Date  . ANTERIOR CERVICAL DECOMP/DISCECTOMY FUSION  05/2018   for myelopathy -C3/4 ([facility])  . CARPAL TUNNEL RELEASE Right 06/2019   ([facility])  . CATARACT EXTRACTION W/ INTRAOCULAR LENS IMPLANT Right 2012   [facility]  . History of EEG  1985   normal  . hospitalization  1981   observation for seizuare  . TONSILLECTOMY    . TRANSURETHRAL RESECTION OF PROSTATE  10/2005   [name]  . TRANSURETHRAL RESECTION OF PROSTATE  08/2017   (rpt) [facility]    There were no vitals filed for this visit.   Subjective Assessment - 05/22/21 1303    Subjective Patient reports his lower legs were really bothering him this morning when he woke up. He felt like his legs didn't want to do  what he wanted them to. He is really hoping something can be done. He was babysitting and the dog was there and had a lot of trouble. No falls since last session. No pain currently.    Pertinent History Patient is a 86 y.o. male who presents to outpatient physical therapy with a referral for medical diagnosis difficulty balancing, lumbar DDD (degenerative disc disease) with request for balance therapy. This patient's chief complaints consist of discomfort of the B LE below the knee, pain in the low back with standing, and difficulty balancing in the setting of chronic R ankle pain, B hand neuropathy symptoms and history of cervical spine disorder with ACDF at C3-4 for myelopathy leading to the following functional deficits: cannot do what he wants to outside because he is afraid to fall, taking the puppy out, prolonged standing, household chores, community ambulation, grooming, climbing stairs, yard work, sleeping, fall risk, etc. .  Relevant past medical history and comorbidities include seizure disorder (controlled with meds), hx R ankle fracture (wears an air brace to prevent pain for years), ACDF (05/2018 for myelopathy), R  Carpal tunnel release (06/2019), BPH, symptoms of peripheral neuropathy at all limbs (fine motor skills in B UE affected), CTS, perfuse degenerative changes on lumbar and cervical spine imaging (see chart for details).  Limitations Standing;Walking;Sitting;Other (comment);House hold activities    How long can you sit comfortably? "a long time' (but will feelit when he stands up)    How long can you stand comfortably? can finish the task but gets really uncomfortable in the back    Diagnostic tests Cervical spine MRI report 11/29/2020: " IMPRESSION:  1. Neurofibroma of the left C2 nerve as seen previously, maximal  transverse diameter 11-12 mm.  2. Previous ACDF C3-4. Canal stenosis remains at this level with AP  diameter of 7 mm, without worsening. No actual cord compression.  3.  Spondylosis and facet arthropathy with degenerative  anterolisthesis of C5-6 and C7-T1. Canal narrowing but no cord  compression.  4. Foraminal stenosis primarily due to facet osteophytes and  uncovertebral osteophytes that could cause neural compression  bilateral at C2-3, bilateral at C3-4, bilateral at C4-5, bilateral  but left more than right at C5-6, bilateral but left more than right  at C6-7 and bilateral but left more than right at C7-T1." Lumbar spine MRI report 11/29/2020: "IMPRESSION:  1. Degenerative disc disease and degenerative facet disease  throughout the lumbar region which could be associated with back  pain. Thoracolumbar scoliotic curvature.  2. L2-3: Moderate central canal stenosis. Bilateral lateral recess  and foraminal stenosis left worse than right. Neural compression  could occur at this level, particularly on the left.  3. L3-4: Bilateral lateral recess and foraminal stenosis, right  worse than left. Neural compression could occur on either side,  particularly on the right.  4. L4-5: Bilateral lateral recess and foraminal stenosis, right more  than left. Neural compression could occur on either side,  particularly the right.  5. L5-S1: Bilateral foraminal narrowing with some potential to  affect either exiting L5 nerve."    Patient Stated Goals he would like to walk "like normal."    Currently in Pain? No/denies           OBJECTIVE FOTO = 54 (05/08/2021); TREATMENT:  Neuromuscular Re-education:to improve, balance, postural strength, muscle activation patterns, and stabilization strength required for functional activities(CGA - min A as needed for safety): - standing static balance: eyesopensemi-narrow stance on compliant surface, rotating head back and forth horizontally,1x20.   Hurdle activities (6 inch):  - forward step and push back to backward step and push back with intermittent UE support. 1x10 each side.  - side step and push back over hurdle, 1x20each side,  no UE support  - step on/off airex+a2z pad with each foot (stradle/together) with intermittant UE support, 1x10 leading with each foot. - forward step/push off of dome side of BOSO with unilateral UE support, 2x10 each side. To improve push through ankles and balance.   - attempted double toe tap on cone with each side then double leg hop laterally, 1x each side. Discontinued due to R ankle pain with hop  Standing on FLAT side of BOSU with touchdown UE support as needed:  - balance with self selected stance, 1 minute - forward/backwards ankle pumps x 10 (discontinued due to pain R ankle)   Therapeutic exercise:to centralize symptoms and improve ROM, strength, muscular endurance, and activity tolerance required for successful completion of functional activities. - standing B heel raises off edge of 2x4 board with BUE support,3x15/11 (cuing for still hips).  Circuit: - sit <> standfrom 18 inch chairwith 3kg med ball toss,3x15  - seated hamstring ball rolls with 3kg med ball with back unsupported,2x20each side  - jogging forwards x 100 feet, CGA  Pt required multimodal  cuing for proper technique and to facilitate improved neuromuscular control, strength, range of motion, and functional ability resulting in improved performance and form.  HOME EXERCISE PROGRAM Access Code: [email or web address] URL: [email or web address] Date: 05/06/2021 Prepared by: Rosita Kea  Exercises Sit to Stand with Arm Reach and Jump - 2-3 x weekly - 3 sets - 10 reps Half Tandem Stance Balance with Head Rotation - 2-3 x weekly - 1 sets - 20 reps [email or web address] CG [id number] Home Exercise Program [[id number]]  LUNGE with something to hold onto beside you. - Repeat 10 Times, Complete 2 Sets, Perform 3 Times a Week  MINI LATERAL LUNGE - Repeat 20 Times, Complete 1 Set, Perform 3 Times a Week  CUP - TOE TAP AND HOLD - Repeat 20 Times, Complete 1 Set, Perform 3 Times a Week  JOGGING IN PLACE -  Duration 20 Seconds, Complete 3 Sets, Perform 3 Times a Week  Incline Heel Raises PF - Repeat 10 Times, Complete 3 Sets, Perform 3 Times a Week    PT Education - 05/22/21 1307    Education Details exercise purpose/form. self-management technique    Person(s) Educated Patient    Methods Explanation;Demonstration;Verbal cues;Tactile cues    Comprehension Verbalized understanding;Returned demonstration;Verbal cues required;Tactile cues required;Need further instruction            PT Short Term Goals - 04/15/21 1000      PT SHORT TERM GOAL #1   Title Be independent with initial home exercise program for self-management of symptoms.    Baseline Initial HEP to be established at visit 2 as appropriate (03/13/2021); Initial HEP established visit 2 (03/18/2021);    Time 3    Period Weeks    Status Achieved    Target Date 04/03/21             PT Long Term Goals - 04/15/21 1001      PT LONG TERM GOAL #1   Title Be independent with a long-term home exercise program for self-management of symptoms.    Baseline Initial HEP to be established at visit 2 as appropriate (03/13/2021); Initial HEP provided visit 2 (03/18/2021);    Time 12    Period Weeks    Status Partially Met    Target Date 06/01/21      PT LONG TERM GOAL #2   Title Demonstrate improved FOTO score by 10 units to demonstrate improvement in overall condition and self-reported functional ability.    Baseline to be measured vsiti 2 as appropriate (03/13/2021); 51 (03/18/2021); 51 (04/15/21)    Time 12    Period Weeks    Status On-going    Target Date 06/05/21      PT LONG TERM GOAL #3   Title Patient will score equal or greater than 25/30 on Functional Gait Assessment to demonstrate reduction in fall risk to low fall risk classification.    Baseline to be measured vsiti 2 as appropriate (03/13/2021); 20/30 moderate fall risk (03/18/2021);    Time 12    Period Weeks    Status On-going    Target Date 06/01/21      PT LONG  TERM GOAL #4   Title Patient will ambulate equal or greater than 1000 feet during 6 minute walk test to demonstrate improved community mobility.    Baseline to be measured vsiti 2 as appropriate (03/13/2021); 820 feet with SPC(03/18/2021); 1223f 04/15/21 (4/10 back pain peak that resolves upon rest.    Time 12    Period Weeks    Status  Achieved    Target Date 06/01/21      PT LONG TERM GOAL #5   Title Complete community, work and/or recreational activities with 50% less limitation due to current condition.    Baseline Functional Limitations: difficulty with activities that require standing balance including outside activities, taking the puppy out, prolonged standing, household chores, community ambulation, grooming, climbing stairs, yard work, sleeping, fall risk, etc. cannot do what he wants to outside because he is afraid to fall (03/13/2021);    Time 12    Period Weeks    Status On-going    Target Date 06/01/21                 Plan - 05/22/21 1626    Clinical Impression Statement Patient tolerated treatment with some limitations due to R ankle pain, so modifications were made in exercises to accommodate. Continued to work on LE balance, strength, power, and agility to improve functional mobility and decrease fall risk. Patient would benefit from continued management of limiting condition by skilled physical therapist to address remaining impairments and functional limitations to work towards stated goals and return to PLOF or maximal functional independence.    Personal Factors and Comorbidities Age;Comorbidity 3+;Past/Current Experience;Fitness;Time since onset of injury/illness/exacerbation    Comorbidities Relevant past medical history and comorbidities include seizure disorder (controlled with meds), hx R ankle fracture (wears an air brace to prevent pain for years), ACDF (05/2018 for myelopathy), R  Carpal tunnel release (06/2019), BPH, symptoms of peripheral neuropathy at all limbs  (fine motor skills in B UE affected), CTS, perfuse degenerative changes on lumbar and cervical spine imaging (see chart for details).    Examination-Activity Limitations Bend;Locomotion Level;Stand;Carry;Sleep;Hygiene/Grooming;Caring for Others;Lift;Stairs;Squat    Examination-Participation Restrictions Shop;Community Activity;Meal Prep;Yard Work    [employer] Evolving/Moderate complexity    Rehab Potential Fair    PT Frequency 2x / week    PT Duration 12 weeks    PT Treatment/Interventions ADLs/Self Care Home Management;Cryotherapy;Traction;Moist Heat;Electrical Stimulation;DME Instruction;Gait training;Stair training;Functional mobility training;Therapeutic activities;Therapeutic exercise;Balance training;Neuromuscular re-education;Patient/family education;Passive range of motion;Dry needling;Manual techniques;Joint Manipulations;Spinal Manipulations    PT Next Visit Plan balance training, LE strength/power    PT Home Exercise Plan Medbridge Access Code: [email or web address]    Consulted and Agree with Plan of Care Patient           Patient will benefit from skilled therapeutic intervention in order to improve the following deficits and impairments:  Abnormal gait,Decreased knowledge of use of DME,Impaired sensation,Improper body mechanics,Pain,Decreased coordination,Decreased mobility,Postural dysfunction,Decreased activity tolerance,Decreased endurance,Decreased range of motion,Decreased strength,Hypomobility,Impaired perceived functional ability,Impaired UE functional use,Decreased balance,Difficulty walking,Impaired flexibility  Visit Diagnosis: Unsteadiness on feet  Chronic bilateral low back pain, unspecified whether sciatica present  Difficulty in walking, not elsewhere classified  History of falling     Problem List Patient Active Problem List   Diagnosis Date Noted  . Hearing loss due to cerumen impaction 03/01/2021  . Atypical chest pain 07/10/2020  . Nerve  sheath tumor 11/22/2019  . Unsteadiness on feet 11/08/2019  . History of spinal surgery 01/17/2019  . Neck pain 01/17/2019  . Weakness of hand 01/17/2019  . Benign prostatic hyperplasia with urinary obstruction 01/17/2019  . Systolic murmur 06/19/1600  . Right carpal tunnel syndrome 02/02/2018  . Ulnar neuropathy at elbow, left 02/02/2018  . Peripheral neuropathy 01/26/2018  . Bilateral buttock pain 06/29/2016  . Failed vision screen 06/29/2016  . Advanced care planning/counseling discussion 06/21/2015  . Medicare annual wellness visit, subsequent 06/18/2014  . Health maintenance  examination 06/18/2014  . Chronic ankle pain 06/18/2014  . Incomplete emptying of bladder 06/07/2013  . Vitamin D deficiency 05/20/2013  . THYROID NODULE 05/17/2007  . HLD (hyperlipidemia) 05/17/2007  . TREMOR, ESSENTIAL 05/17/2007  . Essential hypertension 05/17/2007  . Cervical spondylosis with myelopathy and radiculopathy 05/17/2007  . Seizure disorder ([place]) 05/17/2007    Everlean Alstrom. Graylon Good, PT, DPT 05/22/21, 4:28 PM  [facility] [address] [place], [address] Phone: [phone]   Fax:  [phone]  Name: Mark Holmes MRN: [id number] Date of Birth: [birth year]

## 2021-05-27 ENCOUNTER — Ambulatory Visit: Payer: Medicare Other | Admitting: Physical Therapy

## 2021-05-27 ENCOUNTER — Other Ambulatory Visit: Payer: Self-pay

## 2021-05-27 ENCOUNTER — Encounter: Payer: Self-pay | Admitting: Physical Therapy

## 2021-05-27 DIAGNOSIS — M545 Low back pain, unspecified: Secondary | ICD-10-CM

## 2021-05-27 DIAGNOSIS — R2681 Unsteadiness on feet: Secondary | ICD-10-CM

## 2021-05-27 DIAGNOSIS — Z9181 History of falling: Secondary | ICD-10-CM

## 2021-05-27 DIAGNOSIS — R262 Difficulty in walking, not elsewhere classified: Secondary | ICD-10-CM

## 2021-05-27 DIAGNOSIS — G8929 Other chronic pain: Secondary | ICD-10-CM

## 2021-05-27 NOTE — Therapy (Signed)
[facility] [address], [place], [address] Phone: [phone]   Fax:  [phone]  Physical Therapy Treatment  Patient Details  Name: LENARD KAMPF MRN: [id number] Date of Birth: 10/23/1935 Referring Provider (PT): Allene Dillon, NP   Encounter Date: 05/27/2021   PT End of Session - 05/27/21 1748    Visit Number 19    Number of Visits 24    Date for PT Re-Evaluation 06/05/21    Authorization Type [employer] MEDICARE reporting period from 04/15/2021    Authorization Time Period 03/13/21-06/05/21    Progress Note Due on Visit 20    PT Start Time 1037    PT Stop Time 1115    PT Time Calculation (min) 38 min    Equipment Utilized During Treatment Gait belt    Activity Tolerance Patient tolerated treatment well;Patient limited by fatigue;Patient limited by pain    Behavior During Therapy [facility] for tasks assessed/performed           Past Medical History:  Diagnosis Date  . Benign prostatic hypertrophy with elevated PSA    per Dr. Jacqlyn Larsen  . History of CT scan of brain 1992   normal  . Hyperglycemia 01/2006   102  . Hyperlipemia   . Hypertension   . Seizure disorder [facility])     Past Surgical History:  Procedure Laterality Date  . ANTERIOR CERVICAL DECOMP/DISCECTOMY FUSION  05/2018   for myelopathy -C3/4 ([facility])  . CARPAL TUNNEL RELEASE Right 06/2019   ([facility])  . CATARACT EXTRACTION W/ INTRAOCULAR LENS IMPLANT Right 2012   [facility]  . History of EEG  1985   normal  . hospitalization  1981   observation for seizuare  . TONSILLECTOMY    . TRANSURETHRAL RESECTION OF PROSTATE  10/2005   [name]  . TRANSURETHRAL RESECTION OF PROSTATE  08/2017   (rpt) [facility]    There were no vitals filed for this visit.   Subjective Assessment - 05/27/21 1039    Subjective Patient reports his R ankle continues to bother him. He would like to see if something more can be done about it. He felt great  after his last PT session. Has a little discomfort in his R ankle. The lunges continue to be diffcult at home and the heel raises bother his R ankle a bit.    Pertinent History Patient is a 86 y.o. male who presents to outpatient physical therapy with a referral for medical diagnosis difficulty balancing, lumbar DDD (degenerative disc disease) with request for balance therapy. This patient's chief complaints consist of discomfort of the B LE below the knee, pain in the low back with standing, and difficulty balancing in the setting of chronic R ankle pain, B hand neuropathy symptoms and history of cervical spine disorder with ACDF at C3-4 for myelopathy leading to the following functional deficits: cannot do what he wants to outside because he is afraid to fall, taking the puppy out, prolonged standing, household chores, community ambulation, grooming, climbing stairs, yard work, sleeping, fall risk, etc. .  Relevant past medical history and comorbidities include seizure disorder (controlled with meds), hx R ankle fracture (wears an air brace to prevent pain for years), ACDF (05/2018 for myelopathy), R  Carpal tunnel release (06/2019), BPH, symptoms of peripheral neuropathy at all limbs (fine motor skills in B UE affected), CTS, perfuse degenerative changes on lumbar and cervical spine imaging (see chart for details).    Limitations Standing;Walking;Sitting;Other (comment);House  hold activities    How long can you sit comfortably? "a long time' (but will feelit when he stands up)    How long can you stand comfortably? can finish the task but gets really uncomfortable in the back    Diagnostic tests Cervical spine MRI report 11/29/2020: " IMPRESSION:  1. Neurofibroma of the left C2 nerve as seen previously, maximal  transverse diameter 11-12 mm.  2. Previous ACDF C3-4. Canal stenosis remains at this level with AP  diameter of 7 mm, without worsening. No actual cord compression.  3. Spondylosis and facet  arthropathy with degenerative  anterolisthesis of C5-6 and C7-T1. Canal narrowing but no cord  compression.  4. Foraminal stenosis primarily due to facet osteophytes and  uncovertebral osteophytes that could cause neural compression  bilateral at C2-3, bilateral at C3-4, bilateral at C4-5, bilateral  but left more than right at C5-6, bilateral but left more than right  at C6-7 and bilateral but left more than right at C7-T1." Lumbar spine MRI report 11/29/2020: "IMPRESSION:  1. Degenerative disc disease and degenerative facet disease  throughout the lumbar region which could be associated with back  pain. Thoracolumbar scoliotic curvature.  2. L2-3: Moderate central canal stenosis. Bilateral lateral recess  and foraminal stenosis left worse than right. Neural compression  could occur at this level, particularly on the left.  3. L3-4: Bilateral lateral recess and foraminal stenosis, right  worse than left. Neural compression could occur on either side,  particularly on the right.  4. L4-5: Bilateral lateral recess and foraminal stenosis, right more  than left. Neural compression could occur on either side,  particularly the right.  5. L5-S1: Bilateral foraminal narrowing with some potential to  affect either exiting L5 nerve."    Patient Stated Goals he would like to walk "like normal."    Currently in Pain? No/denies           OBJECTIVE FOTO = 54 (05/08/2021); TREATMENT:  Neuromuscular Re-education:to improve, balance, postural strength, muscle activation patterns, and stabilization strength required for functional activities(CGA - min A as needed for safety): - standing static balance: eyesopensemi-tandem stance on compliant surface, rotating head back and forth horizontally,1x20 each side.  - Balance circuit: 4 double taps on cones each foot, side stepping over 6 inch hurdle each direction, one sit <> stand pushing 10# KB overhead, and one step up per circuit. 1x5 circuits leading with each  foot. CGA for safety. Needed seated rest after each 3 rounds. (patient required hand held min A or U UE support for ~ 50% cone taps with R foot and 95% for cone taps with L foot. Had a lot of difficulty coordinating his LEs and had difficulty with legs periodically slightly buckling, required extended time to complete circuit).  - step up to BOSU (dome up) 1x10 with each LE leading, BUE support.  - step on/off bosu (dome up) with each foot (stradle/together) with intermittantUE support, 1x10 leading with each foot. BUE support  Therapeutic exercise:to centralize symptoms and improve ROM, strength, muscular endurance, and activity tolerance required for successful completion of functional activities. - Total gym single leg heel raises off edge of platform with min A to hold up other leg and find position, 2x10 each side at level 14. - jogging forwards x100 feet, CGA  Pt required multimodal cuing for proper technique and to facilitate improved neuromuscular control, strength, range of motion, and functional ability resulting in improved performance and form.  HOME EXERCISE PROGRAM Access Code: [email or web address] URL:  [email or web address] Date: 05/06/2021 Prepared by: Rosita Kea  Exercises Sit to Stand with Arm Reach and Jump - 2-3 x weekly - 3 sets - 10 reps Half Tandem Stance Balance with Head Rotation - 2-3 x weekly - 1 sets - 20 reps [email or web address] CG [id number] Home Exercise Program [[id number]]  LUNGE with something to hold onto beside you. - Repeat 10 Times, Complete 2 Sets, Perform 3 Times a Week  MINI LATERAL LUNGE - Repeat 20 Times, Complete 1 Set, Perform 3 Times a Week  CUP - TOE TAP AND HOLD - Repeat 20 Times, Complete 1 Set, Perform 3 Times a Week  JOGGING IN PLACE - Duration 20 Seconds, Complete 3 Sets, Perform 3 Times a Week  Incline Heel Raises PF - Repeat 10 Times, Complete 3 Sets, Perform 3 Times a Week    PT Education - 05/27/21 1748    Education  Details exercise purpose/form. self-management technique    Person(s) Educated Patient    Methods Explanation;Demonstration;Tactile cues;Verbal cues    Comprehension Verbalized understanding;Returned demonstration;Verbal cues required;Tactile cues required;Need further instruction            PT Short Term Goals - 04/15/21 1000      PT SHORT TERM GOAL #1   Title Be independent with initial home exercise program for self-management of symptoms.    Baseline Initial HEP to be established at visit 2 as appropriate (03/13/2021); Initial HEP established visit 2 (03/18/2021);    Time 3    Period Weeks    Status Achieved    Target Date 04/03/21             PT Long Term Goals - 04/15/21 1001      PT LONG TERM GOAL #1   Title Be independent with a long-term home exercise program for self-management of symptoms.    Baseline Initial HEP to be established at visit 2 as appropriate (03/13/2021); Initial HEP provided visit 2 (03/18/2021);    Time 12    Period Weeks    Status Partially Met    Target Date 06/01/21      PT LONG TERM GOAL #2   Title Demonstrate improved FOTO score by 10 units to demonstrate improvement in overall condition and self-reported functional ability.    Baseline to be measured vsiti 2 as appropriate (03/13/2021); 51 (03/18/2021); 51 (04/15/21)    Time 12    Period Weeks    Status On-going    Target Date 06/05/21      PT LONG TERM GOAL #3   Title Patient will score equal or greater than 25/30 on Functional Gait Assessment to demonstrate reduction in fall risk to low fall risk classification.    Baseline to be measured vsiti 2 as appropriate (03/13/2021); 20/30 moderate fall risk (03/18/2021);    Time 12    Period Weeks    Status On-going    Target Date 06/01/21      PT LONG TERM GOAL #4   Title Patient will ambulate equal or greater than 1000 feet during 6 minute walk test to demonstrate improved community mobility.    Baseline to be measured vsiti 2 as appropriate  (03/13/2021); 820 feet with SPC(03/18/2021); 1216f 04/15/21 (4/10 back pain peak that resolves upon rest.    Time 12    Period Weeks    Status Achieved    Target Date 06/01/21      PT LONG TERM GOAL #5   Title Complete community, work and/or recreational activities with 50% less limitation due  to current condition.    Baseline Functional Limitations: difficulty with activities that require standing balance including outside activities, taking the puppy out, prolonged standing, household chores, community ambulation, grooming, climbing stairs, yard work, sleeping, fall risk, etc. cannot do what he wants to outside because he is afraid to fall (03/13/2021);    Time 12    Period Weeks    Status On-going    Target Date 06/01/21                 Plan - 05/27/21 1754    Clinical Impression Statement Patient tolerated treatment well overall with some difficulty due to R ankle pain, limited coordination of LE (R > L) and periodic slight giving way of B LE (R >L) that interfered with his balance and strength. Had a lot of trouble learning and performing total gym heel raise and would benefit from reinforcement of exercise in the future. Patient would benefit from continued management of limiting condition by skilled physical therapist to address remaining impairments and functional limitations to work towards stated goals and return to PLOF or maximal functional independence.    Personal Factors and Comorbidities Age;Comorbidity 3+;Past/Current Experience;Fitness;Time since onset of injury/illness/exacerbation    Comorbidities Relevant past medical history and comorbidities include seizure disorder (controlled with meds), hx R ankle fracture (wears an air brace to prevent pain for years), ACDF (05/2018 for myelopathy), R  Carpal tunnel release (06/2019), BPH, symptoms of peripheral neuropathy at all limbs (fine motor skills in B UE affected), CTS, perfuse degenerative changes on lumbar and cervical spine  imaging (see chart for details).    Examination-Activity Limitations Bend;Locomotion Level;Stand;Carry;Sleep;Hygiene/Grooming;Caring for Others;Lift;Stairs;Squat    Examination-Participation Restrictions Shop;Community Activity;Meal Prep;Yard Work    [employer] Evolving/Moderate complexity    Rehab Potential Fair    PT Frequency 2x / week    PT Duration 12 weeks    PT Treatment/Interventions ADLs/Self Care Home Management;Cryotherapy;Traction;Moist Heat;Electrical Stimulation;DME Instruction;Gait training;Stair training;Functional mobility training;Therapeutic activities;Therapeutic exercise;Balance training;Neuromuscular re-education;Patient/family education;Passive range of motion;Dry needling;Manual techniques;Joint Manipulations;Spinal Manipulations    PT Next Visit Plan balance training, LE strength/power    PT Home Exercise Plan Medbridge Access Code: [email or web address]    Consulted and Agree with Plan of Care Patient           Patient will benefit from skilled therapeutic intervention in order to improve the following deficits and impairments:  Abnormal gait,Decreased knowledge of use of DME,Impaired sensation,Improper body mechanics,Pain,Decreased coordination,Decreased mobility,Postural dysfunction,Decreased activity tolerance,Decreased endurance,Decreased range of motion,Decreased strength,Hypomobility,Impaired perceived functional ability,Impaired UE functional use,Decreased balance,Difficulty walking,Impaired flexibility  Visit Diagnosis: Unsteadiness on feet  Chronic bilateral low back pain, unspecified whether sciatica present  Difficulty in walking, not elsewhere classified  History of falling     Problem List Patient Active Problem List   Diagnosis Date Noted  . Hearing loss due to cerumen impaction 03/01/2021  . Atypical chest pain 07/10/2020  . 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 Unsteadiness on feet 11/08/2019  . History of spinal surgery 01/17/2019   . Neck pain 01/17/2019  . Weakness of hand 01/17/2019  . Benign prostatic hyperplasia with urinary obstruction 01/17/2019  . Systolic murmur 09/81/1914  . Right carpal tunnel syndrome 02/02/2018  . Ulnar neuropathy at elbow, left 02/02/2018  . Peripheral neuropathy 01/26/2018  . Bilateral buttock pain 06/29/2016  . Failed vision screen 06/29/2016  . Advanced care planning/counseling discussion 06/21/2015  . Medicare annual wellness visit, subsequent 06/18/2014  . Health maintenance examination 06/18/2014  . Chronic ankle pain 06/18/2014  .  Incomplete emptying of bladder 06/07/2013  . Vitamin D deficiency 05/20/2013  . THYROID NODULE 05/17/2007  . HLD (hyperlipidemia) 05/17/2007  . TREMOR, ESSENTIAL 05/17/2007  . Essential hypertension 05/17/2007  . Cervical spondylosis with myelopathy and radiculopathy 05/17/2007  . Seizure disorder ([facility]) 05/17/2007    Everlean Alstrom. Graylon Good, PT, DPT 05/27/21, 5:55 PM  [facility] [address] [place], [address] Phone: [phone]   Fax:  [phone]  Name: SELAH ZELMAN MRN: [id number] Date of Birth: Dec 13, 1935

## 2021-05-29 ENCOUNTER — Other Ambulatory Visit: Payer: Self-pay

## 2021-05-29 ENCOUNTER — Encounter: Payer: Self-pay | Admitting: Physical Therapy

## 2021-05-29 ENCOUNTER — Ambulatory Visit: Payer: Medicare Other | Admitting: Physical Therapy

## 2021-05-29 DIAGNOSIS — Z9181 History of falling: Secondary | ICD-10-CM

## 2021-05-29 DIAGNOSIS — R262 Difficulty in walking, not elsewhere classified: Secondary | ICD-10-CM

## 2021-05-29 DIAGNOSIS — R2681 Unsteadiness on feet: Secondary | ICD-10-CM | POA: Diagnosis not present

## 2021-05-29 DIAGNOSIS — M545 Low back pain, unspecified: Secondary | ICD-10-CM

## 2021-05-29 NOTE — Therapy (Signed)
[facility] [address] [place], [address] Phone: [phone]   Fax:  [phone]  Physical Therapy Treatment / Progress Note / Re-Certification Dates of reporting: 04/15/2021 - 05/29/2021  Patient Details  Name: Mark Holmes MRN: [id number] Date of Birth: [birth year] Referring Provider (PT): Allene Dillon, NP   Encounter Date: 05/29/2021   PT End of Session - 05/29/21 1106     Visit Number 20    Number of Visits 44    Date for PT Re-Evaluation 08/21/21    Authorization Type [employer] MEDICARE reporting period from 04/15/2021    Authorization Time Period --    Progress Note Due on Visit 20    PT Start Time 1035    PT Stop Time 1115    PT Time Calculation (min) 40 min    Equipment Utilized During Treatment Gait belt    Activity Tolerance Patient tolerated treatment well;Patient limited by fatigue;Patient limited by pain    Behavior During Therapy WFL for tasks assessed/performed             Past Medical History:  Diagnosis Date   Benign prostatic hypertrophy with elevated PSA    per Dr. Jacqlyn Larsen   History of CT scan of brain 1992   normal   Hyperglycemia 01/2006   102   Hyperlipemia    Hypertension    Seizure disorder [facility])     Past Surgical History:  Procedure Laterality Date   ANTERIOR CERVICAL DECOMP/DISCECTOMY FUSION  05/2018   for myelopathy -C3/4 ([facility])   CARPAL TUNNEL RELEASE Right 06/2019   ([facility])   CATARACT EXTRACTION W/ INTRAOCULAR LENS IMPLANT Right 2012   Strawn eye   History of EEG  1985   normal   hospitalization  1981   observation for seizuare   TONSILLECTOMY     TRANSURETHRAL RESECTION OF PROSTATE  10/2005   [place] RESECTION OF PROSTATE  08/2017   (rpt) [facility]    There were no vitals filed for this visit.     Subjective Assessment - 05/29/21 1557     Subjective Patient reports he is feeling well today. He took his right ankle  brace off a little yesterday and  his ankle actually felt a bit better with it off in short periods. thinks PT is also helping his ankle. States he thinks he would still need it for longer distances/times on his feet but is planning to experiment with keeping it off a bit more. No falls since starting PT. Has not had his follow up with the neurologist yet and would really like to continue PT until he sees his neurolgist in mid July. Would like to be able to walk the dog on a trail, be more confident on unven ground, and wean out of his ankle brace.    Pertinent History Patient is a 86 y.o. male who presents to outpatient physical therapy with a referral for medical diagnosis difficulty balancing, lumbar DDD (degenerative disc disease) with request for balance therapy. This patient's chief complaints consist of discomfort of the B LE below the knee, pain in the low back with standing, and difficulty balancing in the setting of chronic R ankle pain, B hand neuropathy symptoms and history of cervical spine disorder with ACDF at C3-4 for myelopathy leading to the following functional deficits: cannot do what he wants to outside because he is afraid to fall, taking the puppy out, prolonged standing, household chores, community ambulation,  grooming, climbing stairs, yard work, sleeping, fall risk, etc. .  Relevant past medical history and comorbidities include seizure disorder (controlled with meds), hx R ankle fracture (wears an air brace to prevent pain for years), ACDF (05/2018 for myelopathy), R  Carpal tunnel release (06/2019), BPH, symptoms of peripheral neuropathy at all limbs (fine motor skills in B UE affected), CTS, perfuse degenerative changes on lumbar and cervical spine imaging (see chart for details).    Limitations Standing;Walking;Sitting;Other (comment);House hold activities    How long can you sit comfortably? "a long time' (but will feelit when he stands up)    How long can you stand comfortably? can  finish the task but gets really uncomfortable in the back    Diagnostic tests Cervical spine MRI report 11/29/2020: " IMPRESSION:  1. Neurofibroma of the left C2 nerve as seen previously, maximal  transverse diameter 11-12 mm.  2. Previous ACDF C3-4. Canal stenosis remains at this level with AP  diameter of 7 mm, without worsening. No actual cord compression.  3. Spondylosis and facet arthropathy with degenerative  anterolisthesis of C5-6 and C7-T1. Canal narrowing but no cord  compression.  4. Foraminal stenosis primarily due to facet osteophytes and  uncovertebral osteophytes that could cause neural compression  bilateral at C2-3, bilateral at C3-4, bilateral at C4-5, bilateral  but left more than right at C5-6, bilateral but left more than right  at C6-7 and bilateral but left more than right at C7-T1." Lumbar spine MRI report 11/29/2020: "IMPRESSION:  1. Degenerative disc disease and degenerative facet disease  throughout the lumbar region which could be associated with back  pain. Thoracolumbar scoliotic curvature.  2. L2-3: Moderate central canal stenosis. Bilateral lateral recess  and foraminal stenosis left worse than right. Neural compression  could occur at this level, particularly on the left.  3. L3-4: Bilateral lateral recess and foraminal stenosis, right  worse than left. Neural compression could occur on either side,  particularly on the right.  4. L4-5: Bilateral lateral recess and foraminal stenosis, right more  than left. Neural compression could occur on either side,  particularly the right.  5. L5-S1: Bilateral foraminal narrowing with some potential to  affect either exiting L5 nerve."    Patient Stated Goals he would like to walk "like normal."    Currently in Pain? No/denies               [facility] PT Assessment - 05/29/21 0001       Assessment   Medical Diagnosis difficulty balancing, lumbar DDD (degenerative disc disease), request for balance therapy    Referring Provider (PT)  Whitney Meeler, NP    Prior Therapy reports a few visits in the past for low back pain      Precautions   Precautions None      Restrictions   Weight Bearing Restrictions No      Prior Function   Level of Independence Independent   used to do yard work, work on his home, Social research officer, government.   Vocation Retired    [email or web address] hobby, puppy, yard work      Charity fundraiser Status Within Functional Limits for tasks assessed      Observation/Other Assessments   Focus on Therapeutic Outcomes (FOTO)  54      Functional Gait  Assessment   Gait assessed  --   took off R ankle brace after walking   Gait Level Surface Walks 20 ft in less than 5.5 sec, no assistive  devices, good speed, no evidence for imbalance, normal gait pattern, deviates no more than 6 in outside of the 12 in walkway width.   4.75 seconds   Change in Gait Speed Able to smoothly change walking speed without loss of balance or gait deviation. Deviate no more than 6 in outside of the 12 in walkway width.    Gait with Horizontal Head Turns Performs head turns smoothly with slight change in gait velocity (eg, minor disruption to smooth gait path), deviates 6-10 in outside 12 in walkway width, or uses an assistive device.    Gait with Vertical Head Turns Performs head turns with no change in gait. Deviates no more than 6 in outside 12 in walkway width.    Gait and Pivot Turn Pivot turns safely within 3 sec and stops quickly with no loss of balance.    Step Over Obstacle Is able to step over 2 stacked shoe boxes taped together (9 in total height) without changing gait speed. No evidence of imbalance.    Gait with Narrow Base of Support Ambulates less than 4 steps heel to toe or cannot perform without assistance.    Gait with Eyes Closed Walks 20 ft, uses assistive device, slower speed, mild gait deviations, deviates 6-10 in outside 12 in walkway width. Ambulates 20 ft in less than 9 sec but greater than 7 sec.    Ambulating  Backwards Walks 20 ft, uses assistive device, slower speed, mild gait deviations, deviates 6-10 in outside 12 in walkway width.    Steps Alternating feet, must use rail.    Total Score 23             OBJECTIVE  FOTO = 54 (05/29/2021);  FUNCTIONAL TESTS 6 Minute Walk Test: 89f c progressive pain near the B SIJ achy to 5/10. Knees feel wobbly. Slightly stooped, wide base of support. Did not use AD but grabbed SPC to carry with him for the last minute.  FGA = 23/30 (see above)   TREATMENT:     Therapeutic exercise: to centralize symptoms and improve ROM, strength, muscular endurance, and activity tolerance required for successful completion of functional activities.  - ambulation around clinic for time in 6 minutes, no AD first 5 min, [place] him during last minute due to feeling unsteady. (See above).  - Education on diagnosis, prognosis, POC, anatomy and physiology of current condition.   Neuromuscular Re-education: to improve, balance, postural strength, muscle activation patterns, and stabilization strength required for functional activities (CGA - supervision  as needed for safety): - functional gait assessment (removed ankle brace; see above)   Pt required multimodal cuing for proper technique and to facilitate improved neuromuscular control, strength, range of motion, and functional ability resulting in improved performance and form.    HOME EXERCISE PROGRAM Access Code: [email or web address] URL: [email or web address] Date: 05/06/2021 Prepared by: SRosita Kea  Exercises Sit to Stand with Arm Reach and Jump - 2-3 x weekly - 3 sets - 10 reps Half Tandem Stance Balance with Head Rotation - 2-3 x weekly - 1 sets - 20 reps  [email or web address] CG [id number] Exercise Program [[[id number]]  LUNGE with something to hold onto beside you.  -  Repeat 10 Times, Complete 2 Sets, Perform 3 Times a Week   MINI LATERAL LUNGE -  Repeat 20 Times, Complete 1 Set, Perform 3  Times a Week   CUP - TOE TAP AND HOLD -  Repeat 20 Times, Complete 1 Set, Perform  3 Times a Week   JOGGING IN PLACE -  Duration 20 Seconds, Complete 3 Sets, Perform 3 Times a Week   Incline Heel Raises PF -  Repeat 10 Times, Complete 3 Sets, Perform 3 Times a Week    PT Education - 05/29/21 1106     Education Details exercise purpose/form. self-management technique, POC, progress    Person(s) Educated Patient    Methods Explanation;Tactile cues;Demonstration;Verbal cues    Comprehension Verbalized understanding;Returned demonstration;Verbal cues required;Tactile cues required              PT Short Term Goals - 04/15/21 1000       PT SHORT TERM GOAL #1   Title Be independent with initial home exercise program for self-management of symptoms.    Baseline Initial HEP to be established at visit 2 as appropriate (03/13/2021); Initial HEP established visit 2 (03/18/2021);    Time 3    Period Weeks    Status Achieved    Target Date 04/03/21               PT Long Term Goals - 05/29/21 1108       PT LONG TERM GOAL #1   Title Be independent with a long-term home exercise program for self-management of symptoms.    Baseline Initial HEP to be established at visit 2 as appropriate (03/13/2021); Initial HEP provided visit 2 (03/18/2021); continues to participate in appropriate HEP (05/29/2021);    Time 12    Period Weeks    Status Partially Met   TARGET DATE FOR ALL LONG TERM GOALS: 06/01/2021, updated to 08/21/2021 for unmet goals   Target Date 08/21/21      PT LONG TERM GOAL #2   Title Demonstrate improved FOTO score by 10 units to demonstrate improvement in overall condition and self-reported functional ability.    Baseline to be measured vsiti 2 as appropriate (03/13/2021); 51 (03/18/2021); 51 (04/15/21): 54 (05/29/2021);    Time 12    Period Weeks    Status Partially Met      PT LONG TERM GOAL #3   Title Patient will score equal or greater than 25/30 on Functional Gait Assessment  to demonstrate reduction in fall risk to low fall risk classification.    Baseline to be measured vsiti 2 as appropriate (03/13/2021); 20/30 moderate fall risk (03/18/2021); 23/30 (05/29/2021);    Time 12    Period Weeks    Status On-going      PT LONG TERM GOAL #4   Title Patient will ambulate equal or greater than 1000 feet during 6 minute walk test to demonstrate improved community mobility.    Baseline to be measured vsiti 2 as appropriate (03/13/2021); 820 feet with SPC(03/18/2021); 1274f, 4/10 back pain peak that resolves upon rest (04/15/21); 1000 feet with increasing B SIJ pain (05/29/2021);    Time 12    Period Weeks    Status Achieved      PT LONG TERM GOAL #5   Title Complete community, work and/or recreational activities with 50% less limitation due to current condition.    Baseline Functional Limitations: difficulty with activities that require standing balance including outside activities, taking the puppy out, prolonged standing, household chores, community ambulation, grooming, climbing stairs, yard work, sleeping, fall risk, etc. cannot do what he wants to outside because he is afraid to fall (03/13/2021); reports improvements in taking the dog in the front lawn with the leach, wants to be able to walk the dog on trails at  the park, has had no falls since he started PT, would like to be able to walk better on uneve ground, has noticed improvements in his function but continues to have some deficits (05/29/2021);    Time 12    Period Weeks    Status Partially Met                   Plan - 05/29/21 1553     Clinical Impression Statement Pateint has attended 20 physical therapy sessions this episode of care and continues to make progress towards goals. Patient reports improved function and confidence at home and has not fallen since starting PT. He demonstrates improvement on FGA from 20/30 to 23/30 suggesting improvement in fall risk. Continues to have difficulty with some ataxia  in his gait and LE function that worsens with fatigued and prolonged weight bearing activity. Patient is motivated to continue working to improve his balance and functional activity tolerance, with particular attention to being able to walk his dog safely on uneven ground.    Personal Factors and Comorbidities Age;Comorbidity 3+;Past/Current Experience;Fitness;Time since onset of injury/illness/exacerbation    Comorbidities Relevant past medical history and comorbidities include seizure disorder (controlled with meds), hx R ankle fracture (wears an air brace to prevent pain for years), ACDF (05/2018 for myelopathy), R  Carpal tunnel release (06/2019), BPH, symptoms of peripheral neuropathy at all limbs (fine motor skills in B UE affected), CTS, perfuse degenerative changes on lumbar and cervical spine imaging (see chart for details).    Examination-Activity Limitations Bend;Locomotion Level;Stand;Carry;Sleep;Hygiene/Grooming;Caring for Others;Lift;Stairs;Squat    Examination-Participation Restrictions Shop;Community Activity;Meal Prep;Yard Work    Merchant navy officer Evolving/Moderate complexity    Rehab Potential Fair    PT Frequency 2x / week    PT Duration 12 weeks    PT Treatment/Interventions ADLs/Self Care Home Management;Cryotherapy;Traction;Moist Heat;Electrical Stimulation;DME Instruction;Gait training;Stair training;Functional mobility training;Therapeutic activities;Therapeutic exercise;Balance training;Neuromuscular re-education;Patient/family education;Passive range of motion;Dry needling;Manual techniques;Joint Manipulations;Spinal Manipulations    PT Next Visit Plan balance training, LE strength/power    PT Home Exercise Plan Medbridge Access Code: [email or web address]    Consulted and Agree with Plan of Care Patient             Patient will benefit from skilled therapeutic intervention in order to improve the following deficits and impairments:  Abnormal gait, Decreased knowledge  of use of DME, Impaired sensation, Improper body mechanics, Pain, Decreased coordination, Decreased mobility, Postural dysfunction, Decreased activity tolerance, Decreased endurance, Decreased range of motion, Decreased strength, Hypomobility, Impaired perceived functional ability, Impaired UE functional use, Decreased balance, Difficulty walking, Impaired flexibility  Visit Diagnosis: Unsteadiness on feet  Chronic bilateral low back pain, unspecified whether sciatica present  Difficulty in walking, not elsewhere classified  History of falling     Problem List Patient Active Problem List   Diagnosis Date Noted   Hearing loss due to cerumen impaction 03/01/2021   Atypical chest pain 07/10/2020   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   Unsteadiness on feet 11/08/2019   History of spinal surgery 01/17/2019   Neck pain 01/17/2019   Weakness of hand 01/17/2019   Benign prostatic hyperplasia with urinary obstruction 37/09/6268   Systolic murmur 48/54/6270   Right carpal tunnel syndrome 02/02/2018   Ulnar neuropathy at elbow, left 02/02/2018   Peripheral neuropathy 01/26/2018   Bilateral buttock pain 06/29/2016   Failed vision screen 06/29/2016   Advanced care planning/counseling discussion 06/21/2015   Medicare annual wellness visit, subsequent 06/18/2014   Health maintenance examination 06/18/2014   Chronic  ankle pain 06/18/2014   Incomplete emptying of bladder 06/07/2013   Vitamin D deficiency 05/20/2013   THYROID NODULE 05/17/2007   HLD (hyperlipidemia) 05/17/2007   TREMOR, ESSENTIAL 05/17/2007   Essential hypertension 05/17/2007   Cervical spondylosis with myelopathy and radiculopathy 05/17/2007   Seizure disorder ([place]) 05/17/2007    Nancy Nordmann 05/29/2021, 4:49 PM  [facility] [address] [place], [address] Phone: [phone]   Fax:  [phone]  Name: Mark Holmes MRN: [id number] Date of  Birth: 07/22/1935

## 2021-06-03 ENCOUNTER — Encounter: Payer: Self-pay | Admitting: Physical Therapy

## 2021-06-03 ENCOUNTER — Ambulatory Visit: Payer: Medicare Other | Admitting: Physical Therapy

## 2021-06-03 ENCOUNTER — Other Ambulatory Visit: Payer: Self-pay

## 2021-06-03 DIAGNOSIS — R2681 Unsteadiness on feet: Secondary | ICD-10-CM

## 2021-06-03 DIAGNOSIS — R262 Difficulty in walking, not elsewhere classified: Secondary | ICD-10-CM

## 2021-06-03 DIAGNOSIS — M545 Low back pain, unspecified: Secondary | ICD-10-CM

## 2021-06-03 DIAGNOSIS — Z9181 History of falling: Secondary | ICD-10-CM

## 2021-06-03 NOTE — Therapy (Signed)
[facility] [address] [place], [address] Phone: [phone]   Fax:  [phone]  Physical Therapy Treatment  Patient Details  Name: Mark Holmes MRN: [id number] Date of Birth: December 09, 1935 Referring Provider (PT): Allene Dillon, NP   Encounter Date: 06/03/2021   PT End of Session - 06/03/21 1505     Visit Number 21    Number of Visits 44    Date for PT Re-Evaluation 08/21/21    Authorization Type [employer] MEDICARE reporting period from 04/15/2021    Progress Note Due on Visit 30    PT Start Time 1035    PT Stop Time 1115    PT Time Calculation (min) 40 min    Equipment Utilized During Treatment Gait belt    Activity Tolerance Patient tolerated treatment well    Behavior During Therapy [facility] for tasks assessed/performed             Past Medical History:  Diagnosis Date   Benign prostatic hypertrophy with elevated PSA    per Dr. Jacqlyn Larsen   History of CT scan of brain 1992   normal   Hyperglycemia 01/2006   102   Hyperlipemia    Hypertension    Seizure disorder [facility])     Past Surgical History:  Procedure Laterality Date   ANTERIOR CERVICAL DECOMP/DISCECTOMY FUSION  05/2018   for myelopathy -C3/4 ([facility])   CARPAL TUNNEL RELEASE Right 06/2019   ([facility])   CATARACT EXTRACTION W/ INTRAOCULAR LENS IMPLANT Right 2012   [facility]   History of EEG  1985   normal   hospitalization  1981   observation for seizuare   TONSILLECTOMY     TRANSURETHRAL RESECTION OF PROSTATE  10/2005   [place] RESECTION OF PROSTATE  08/2017   (rpt) [place]    There were no vitals filed for this visit.   Subjective Assessment - 06/03/21 1504     Subjective Patient reports he is doing well today. States his R ankle hurt a lot on saturday and is unsure why. Has been enjoying going without the brace at times and walking bare foot in the house sometimes.    Pertinent History Patient is  a 86 y.o. male who presents to outpatient physical therapy with a referral for medical diagnosis difficulty balancing, lumbar DDD (degenerative disc disease) with request for balance therapy. This patient's chief complaints consist of discomfort of the B LE below the knee, pain in the low back with standing, and difficulty balancing in the setting of chronic R ankle pain, B hand neuropathy symptoms and history of cervical spine disorder with ACDF at C3-4 for myelopathy leading to the following functional deficits: cannot do what he wants to outside because he is afraid to fall, taking the puppy out, prolonged standing, household chores, community ambulation, grooming, climbing stairs, yard work, sleeping, fall risk, etc. .  Relevant past medical history and comorbidities include seizure disorder (controlled with meds), hx R ankle fracture (wears an air brace to prevent pain for years), ACDF (05/2018 for myelopathy), R  Carpal tunnel release (06/2019), BPH, symptoms of peripheral neuropathy at all limbs (fine motor skills in B UE affected), CTS, perfuse degenerative changes on lumbar and cervical spine imaging (see chart for details).    Limitations Standing;Walking;Sitting;Other (comment);House hold activities    How long can you sit comfortably? "a long time' (but will feelit when he stands up)    How long can you  stand comfortably? can finish the task but gets really uncomfortable in the back    Diagnostic tests Cervical spine MRI report 11/29/2020: " IMPRESSION:  1. Neurofibroma of the left C2 nerve as seen previously, maximal  transverse diameter 11-12 mm.  2. Previous ACDF C3-4. Canal stenosis remains at this level with AP  diameter of 7 mm, without worsening. No actual cord compression.  3. Spondylosis and facet arthropathy with degenerative  anterolisthesis of C5-6 and C7-T1. Canal narrowing but no cord  compression.  4. Foraminal stenosis primarily due to facet osteophytes and  uncovertebral osteophytes  that could cause neural compression  bilateral at C2-3, bilateral at C3-4, bilateral at C4-5, bilateral  but left more than right at C5-6, bilateral but left more than right  at C6-7 and bilateral but left more than right at C7-T1." Lumbar spine MRI report 11/29/2020: "IMPRESSION:  1. Degenerative disc disease and degenerative facet disease  throughout the lumbar region which could be associated with back  pain. Thoracolumbar scoliotic curvature.  2. L2-3: Moderate central canal stenosis. Bilateral lateral recess  and foraminal stenosis left worse than right. Neural compression  could occur at this level, particularly on the left.  3. L3-4: Bilateral lateral recess and foraminal stenosis, right  worse than left. Neural compression could occur on either side,  particularly on the right.  4. L4-5: Bilateral lateral recess and foraminal stenosis, right more  than left. Neural compression could occur on either side,  particularly the right.  5. L5-S1: Bilateral foraminal narrowing with some potential to  affect either exiting L5 nerve."    Patient Stated Goals he would like to walk "like normal."    Currently in Pain? No/denies             OBJECTIVE   FOTO = 54 (05/29/2021);  TREATMENT:  Shoes/R ankle brace doffed until exiting clinic.  Neuromuscular Re-education: to improve, balance, postural strength, muscle activation patterns, and stabilization strength required for functional activities (CGA - min A as needed for safety): - standing static balance: eyes open semi-narrow stance on compliant surface, rotating head back and forth horizontally, 1x20 each side.    - Balance circuit: 4 double taps on cones each foot, side stepping over 6 inch hurdle each direction, one sit <> stand pushing 10# KB overhead, and one step up per circuit. 1x3 circuits leading with each foot. CGA for safety. No need for seated rest. (patient required hand held min A or U UE support for ~ 0% cone taps with R foot and 25% for  cone taps with L foot. Had some difficulty coordinating his LEs) - step up to BOSU (dome up) 1x10 with each LE leading, BUE support.  - step on/off bosu (dome up) with each foot (stradle/together) with intermittant UE support, 1x10 leading with each foot. BUE support   Therapeutic exercise: to centralize symptoms and improve ROM, strength, muscular endurance, and activity tolerance required for successful completion of functional activities.  - toe walking with min A 2x30 feet, heel walking SBA 1x30 feet.  - Total gym single leg heel raises off edge of platform with min A to hold up other leg and find position, 3x20 each side at L at level 14, R at level 6.    HOME EXERCISE PROGRAM Access Code: [email or web address] URL: [email or web address] Date: 05/06/2021 Prepared by: Rosita Kea   Exercises Sit to Stand with Arm Reach and Jump - 2-3 x weekly - 3 sets - 10 reps Half Tandem Stance Balance with Head  Rotation - 2-3 x weekly - 1 sets - 20 reps  [email or web address] CG [id number] Home Exercise Program [[id number]]   LUNGE with something to hold onto beside you.  -  Repeat 10 Times, Complete 2 Sets, Perform 3 Times a Week   MINI LATERAL LUNGE -  Repeat 20 Times, Complete 1 Set, Perform 3 Times a Week   CUP - TOE TAP AND HOLD -  Repeat 20 Times, Complete 1 Set, Perform 3 Times a Week   JOGGING IN PLACE -  Duration 20 Seconds, Complete 3 Sets, Perform 3 Times a Week   Incline Heel Raises PF -  Repeat 10 Times, Complete 3 Sets, Perform 3 Times a Week   PT Education - 06/03/21 1505     Education Details exercise purpose/form. self-management techniques    Person(s) Educated Patient    Methods Explanation;Tactile cues;Demonstration;Verbal cues    Comprehension Verbalized understanding;Returned demonstration;Verbal cues required;Tactile cues required;Need further instruction              PT Short Term Goals - 04/15/21 1000       PT SHORT TERM GOAL #1   Title Be independent with initial home  exercise program for self-management of symptoms.    Baseline Initial HEP to be established at visit 2 as appropriate (03/13/2021); Initial HEP established visit 2 (03/18/2021);    Time 3    Period Weeks    Status Achieved    Target Date 04/03/21               PT Long Term Goals - 05/29/21 1108       PT LONG TERM GOAL #1   Title Be independent with a long-term home exercise program for self-management of symptoms.    Baseline Initial HEP to be established at visit 2 as appropriate (03/13/2021); Initial HEP provided visit 2 (03/18/2021); continues to participate in appropriate HEP (05/29/2021);    Time 12    Period Weeks    Status Partially Met   TARGET DATE FOR ALL LONG TERM GOALS: 06/01/2021, updated to 08/21/2021 for unmet goals   Target Date 08/21/21      PT LONG TERM GOAL #2   Title Demonstrate improved FOTO score by 10 units to demonstrate improvement in overall condition and self-reported functional ability.    Baseline to be measured vsiti 2 as appropriate (03/13/2021); 51 (03/18/2021); 51 (04/15/21): 54 (05/29/2021);    Time 12    Period Weeks    Status Partially Met      PT LONG TERM GOAL #3   Title Patient will score equal or greater than 25/30 on Functional Gait Assessment to demonstrate reduction in fall risk to low fall risk classification.    Baseline to be measured vsiti 2 as appropriate (03/13/2021); 20/30 moderate fall risk (03/18/2021); 23/30 (05/29/2021);    Time 12    Period Weeks    Status On-going      PT LONG TERM GOAL #4   Title Patient will ambulate equal or greater than 1000 feet during 6 minute walk test to demonstrate improved community mobility.    Baseline to be measured vsiti 2 as appropriate (03/13/2021); 820 feet with SPC(03/18/2021); 1264f, 4/10 back pain peak that resolves upon rest (04/15/21); 1000 feet with increasing B SIJ pain (05/29/2021);    Time 12    Period Weeks    Status Achieved      PT LONG TERM GOAL #5   Title Complete community, work and/or  recreational activities with 50% less  limitation due to current condition.    Baseline Functional Limitations: difficulty with activities that require standing balance including outside activities, taking the puppy out, prolonged standing, household chores, community ambulation, grooming, climbing stairs, yard work, sleeping, fall risk, etc. cannot do what he wants to outside because he is afraid to fall (03/13/2021); reports improvements in taking the dog in the front lawn with the leach, wants to be able to walk the dog on trails at the park, has had no falls since he started PT, would like to be able to walk better on uneve ground, has noticed improvements in his function but continues to have some deficits (05/29/2021);    Time 12    Period Weeks    Status Partially Met                   Plan - 06/03/21 1509     Clinical Impression Statement Patient tolerated treatment well and demonstrated improved neuromuscular control today. Wanted to keep shoes/brace off at several points when PT checked if he felt he needed to put it back on. Continued to work on balance and LE strength/coordination/power. Patient would benefit from continued management of limiting condition by skilled physical therapist to address remaining impairments and functional limitations to work towards stated goals and return to PLOF or maximal functional independence.    Personal Factors and Comorbidities Age;Comorbidity 3+;Past/Current Experience;Fitness;Time since onset of injury/illness/exacerbation    Comorbidities Relevant past medical history and comorbidities include seizure disorder (controlled with meds), hx R ankle fracture (wears an air brace to prevent pain for years), ACDF (05/2018 for myelopathy), R  Carpal tunnel release (06/2019), BPH, symptoms of peripheral neuropathy at all limbs (fine motor skills in B UE affected), CTS, perfuse degenerative changes on lumbar and cervical spine imaging (see chart for details).     Examination-Activity Limitations Bend;Locomotion Level;Stand;Carry;Sleep;Hygiene/Grooming;Caring for Others;Lift;Stairs;Squat    Examination-Participation Restrictions Shop;Community Activity;Meal Prep;Yard Work    [employer] Evolving/Moderate complexity    Rehab Potential Fair    PT Frequency 2x / week    PT Duration 12 weeks    PT Treatment/Interventions ADLs/Self Care Home Management;Cryotherapy;Traction;Moist Heat;Electrical Stimulation;DME Instruction;Gait training;Stair training;Functional mobility training;Therapeutic activities;Therapeutic exercise;Balance training;Neuromuscular re-education;Patient/family education;Passive range of motion;Dry needling;Manual techniques;Joint Manipulations;Spinal Manipulations    PT Next Visit Plan balance training, LE strength/power    PT Home Exercise Plan Medbridge Access Code: [email or web address]    Consulted and Agree with Plan of Care Patient             Patient will benefit from skilled therapeutic intervention in order to improve the following deficits and impairments:  Abnormal gait, Decreased knowledge of use of DME, Impaired sensation, Improper body mechanics, Pain, Decreased coordination, Decreased mobility, Postural dysfunction, Decreased activity tolerance, Decreased endurance, Decreased range of motion, Decreased strength, Hypomobility, Impaired perceived functional ability, Impaired UE functional use, Decreased balance, Difficulty walking, Impaired flexibility  Visit Diagnosis: Unsteadiness on feet  Chronic bilateral low back pain, unspecified whether sciatica present  Difficulty in walking, not elsewhere classified  History of falling     Problem List Patient Active Problem List   Diagnosis Date Noted   Hearing loss due to cerumen impaction 03/01/2021   Atypical chest pain 07/10/2020   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   Unsteadiness on feet 11/08/2019   History of spinal surgery 01/17/2019   Neck pain  01/17/2019   Weakness of hand 01/17/2019   Benign prostatic hyperplasia with urinary obstruction 54/49/2010   Systolic murmur 07/01/1974   Right  carpal tunnel syndrome 02/02/2018   Ulnar neuropathy at elbow, left 02/02/2018   Peripheral neuropathy 01/26/2018   Bilateral buttock pain 06/29/2016   Failed vision screen 06/29/2016   Advanced care planning/counseling discussion 06/21/2015   Medicare annual wellness visit, subsequent 06/18/2014   Health maintenance examination 06/18/2014   Chronic ankle pain 06/18/2014   Incomplete emptying of bladder 06/07/2013   Vitamin D deficiency 05/20/2013   THYROID NODULE 05/17/2007   HLD (hyperlipidemia) 05/17/2007   TREMOR, ESSENTIAL 05/17/2007   Essential hypertension 05/17/2007   Cervical spondylosis with myelopathy and radiculopathy 05/17/2007   Seizure disorder ([place]) 05/17/2007    Everlean Alstrom. Graylon Good, PT, DPT 06/03/21, 3:10 PM   [facility] [address] [place], [address] Phone: [phone]   Fax:  [phone]  Name: Mark Holmes MRN: [id number] Date of Birth: 1935-09-16

## 2021-06-05 ENCOUNTER — Encounter: Payer: Self-pay | Admitting: Physical Therapy

## 2021-06-05 ENCOUNTER — Other Ambulatory Visit: Payer: Self-pay

## 2021-06-05 ENCOUNTER — Ambulatory Visit: Payer: Medicare Other | Admitting: Physical Therapy

## 2021-06-05 DIAGNOSIS — R2681 Unsteadiness on feet: Secondary | ICD-10-CM | POA: Diagnosis not present

## 2021-06-05 DIAGNOSIS — R262 Difficulty in walking, not elsewhere classified: Secondary | ICD-10-CM

## 2021-06-05 DIAGNOSIS — Z9181 History of falling: Secondary | ICD-10-CM

## 2021-06-05 DIAGNOSIS — M545 Low back pain, unspecified: Secondary | ICD-10-CM

## 2021-06-05 DIAGNOSIS — G8929 Other chronic pain: Secondary | ICD-10-CM

## 2021-06-05 NOTE — Therapy (Signed)
[facility] [address], [place], [address] Phone: [phone]   Fax:  [phone]  Physical Therapy Treatment  Patient Details  Name: Mark Holmes MRN: [id number] Date of Birth: 1935-06-10 Referring Provider (PT): Allene Dillon, NP   Encounter Date: 06/05/2021   PT End of Session - 06/05/21 0908     Visit Number 22    Number of Visits 44    Date for PT Re-Evaluation 08/21/21    Authorization Type [employer] MEDICARE reporting period from 04/15/2021    Progress Note Due on Visit 30    PT Start Time 0902    PT Stop Time 0942    PT Time Calculation (min) 40 min    Equipment Utilized During Treatment Gait belt    Activity Tolerance Patient tolerated treatment well    Behavior During Therapy [facility] for tasks assessed/performed             Past Medical History:  Diagnosis Date   Benign prostatic hypertrophy with elevated PSA    per Dr. Jacqlyn Larsen   History of CT scan of brain 1992   normal   Hyperglycemia 01/2006   102   Hyperlipemia    Hypertension    Seizure disorder [facility])     Past Surgical History:  Procedure Laterality Date   ANTERIOR CERVICAL DECOMP/DISCECTOMY FUSION  05/2018   for myelopathy -C3/4 ([facility])   CARPAL TUNNEL RELEASE Right 06/2019   ([facility])   CATARACT EXTRACTION W/ INTRAOCULAR LENS IMPLANT Right 2012   [facility]   History of EEG  1985   normal   hospitalization  1981   observation for seizuare   TONSILLECTOMY     TRANSURETHRAL RESECTION OF PROSTATE  10/2005   [place] RESECTION OF PROSTATE  08/2017   (rpt) [facility]    There were no vitals filed for this visit.   Subjective Assessment - 06/05/21 0905     Subjective Patient reports his right ankle was a little sore after last session but it cleared up pretty quick. Unable to provide a specific time frame. State he sat for only a few minutes this morning and then had familiar pain/discomfort in  his lower legs that he guesses is related to neuropathy. No pain currently.    Pertinent History Patient is a 86 y.o. male who presents to outpatient physical therapy with a referral for medical diagnosis difficulty balancing, lumbar DDD (degenerative disc disease) with request for balance therapy. This patient's chief complaints consist of discomfort of the B LE below the knee, pain in the low back with standing, and difficulty balancing in the setting of chronic R ankle pain, B hand neuropathy symptoms and history of cervical spine disorder with ACDF at C3-4 for myelopathy leading to the following functional deficits: cannot do what he wants to outside because he is afraid to fall, taking the puppy out, prolonged standing, household chores, community ambulation, grooming, climbing stairs, yard work, sleeping, fall risk, etc. .  Relevant past medical history and comorbidities include seizure disorder (controlled with meds), hx R ankle fracture (wears an air brace to prevent pain for years), ACDF (05/2018 for myelopathy), R  Carpal tunnel release (06/2019), BPH, symptoms of peripheral neuropathy at all limbs (fine motor skills in B UE affected), CTS, perfuse degenerative changes on lumbar and cervical spine imaging (see chart for details).    Limitations Standing;Walking;Sitting;Other (comment);House hold activities    How long can you sit comfortably? "  a long time' (but will feel it when he stands up)    How long can you stand comfortably? can finish the task but gets really uncomfortable in the back    Diagnostic tests Cervical spine MRI report 11/29/2020: " IMPRESSION:  1. Neurofibroma of the left C2 nerve as seen previously, maximal  transverse diameter 11-12 mm.  2. Previous ACDF C3-4. Canal stenosis remains at this level with AP  diameter of 7 mm, without worsening. No actual cord compression.  3. Spondylosis and facet arthropathy with degenerative  anterolisthesis of C5-6 and C7-T1. Canal narrowing but no  cord  compression.  4. Foraminal stenosis primarily due to facet osteophytes and  uncovertebral osteophytes that could cause neural compression  bilateral at C2-3, bilateral at C3-4, bilateral at C4-5, bilateral  but left more than right at C5-6, bilateral but left more than right  at C6-7 and bilateral but left more than right at C7-T1." Lumbar spine MRI report 11/29/2020: "IMPRESSION:  1. Degenerative disc disease and degenerative facet disease  throughout the lumbar region which could be associated with back  pain. Thoracolumbar scoliotic curvature.  2. L2-3: Moderate central canal stenosis. Bilateral lateral recess  and foraminal stenosis left worse than right. Neural compression  could occur at this level, particularly on the left.  3. L3-4: Bilateral lateral recess and foraminal stenosis, right  worse than left. Neural compression could occur on either side,  particularly on the right.  4. L4-5: Bilateral lateral recess and foraminal stenosis, right more  than left. Neural compression could occur on either side,  particularly the right.  5. L5-S1: Bilateral foraminal narrowing with some potential to  affect either exiting L5 nerve."    Patient Stated Goals he would like to walk "like normal."    Currently in Pain? No/denies             OBJECTIVE   FOTO = 54 (05/29/2021);   TREATMENT:  Shoes/R ankle brace doffed until exiting clinic.   Neuromuscular Re-education: to improve, balance, postural strength, muscle activation patterns, and stabilization strength required for functional activities (CGA - min A as needed for safety): - standing static balance: eyes open semi-narrow stance on compliant surface, rotating head back and forth horizontally, 1x20 each side.   - tandem stance static balance on airex with U UE support as needed, 60 seconds each side.  - Balance circuit: 4 double taps on cones each foot, side stepping over 6 inch hurdle each direction, one sit <> stand pushing 10# KB overhead,  and one step up per circuit. 1x3 circuits leading with each foot. CGA for safety. No need for seated rest. (patient required hand held min A or U UE support for ~ 0% cone taps with R foot and 25% for cone taps with L foot. Had some difficulty coordinating his LEs)   Therapeutic exercise: to centralize symptoms and improve ROM, strength, muscular endurance, and activity tolerance required for successful completion of functional activities.  - Total gym single leg heel raises off edge of platform with min A to hold up other leg and find position, L 3x30/31/30 at level 14/16/18, R 3x20/25/20 at level 6/6/7. - seated R plantar flexion with yellow theraband, 1x10 - seated R ankle eversion against yellow theraband, 1x10 - seated R ankle inversion against yellow theraband in figure 4 position, 1x5 (too hard).  - seated B heel raise with inversion with yellow theraband looped around heels, 1x10    HOME EXERCISE PROGRAM Access Code: [email or web address] URL: [email or web address]  Date: 06/05/2021 Prepared by: Rosita Kea  Exercises Sit to Stand with Arm Reach and Jump - 2-3 x weekly - 3 sets - 10 reps Half Tandem Stance Balance with Head Rotation - 2-3 x weekly - 1 sets - 20 reps Seated Ankle Plantar Flexion with Resistance Loop - 1 x daily - 3 sets - 10 reps Seated Ankle Eversion with Resistance - 1 x daily - 3 sets - 10 reps    [email or web address] CG [id number] Home Exercise Program [[id number]]   LUNGE with something to hold onto beside you.  -  Repeat 10 Times, Complete 2 Sets, Perform 3 Times a Week   MINI LATERAL LUNGE -  Repeat 20 Times, Complete 1 Set, Perform 3 Times a Week   CUP - TOE TAP AND HOLD -  Repeat 20 Times, Complete 1 Set, Perform 3 Times a Week   JOGGING IN PLACE -  Duration 20 Seconds, Complete 3 Sets, Perform 3 Times a Week   Incline Heel Raises PF -  Repeat 10 Times, Complete 3 Sets, Perform 3 Times a [place] [[id number]]  Post Tib Activation -  Repeat 10  Times, Hold 1 Second(s), Complete 3 Sets, Perform 1 Times a Day    PT Education - 06/05/21 0908     Education Details exercise purpose/form. self-management techniques    Person(s) Educated Patient    Methods Explanation;Demonstration;Tactile cues;Verbal cues    Comprehension Verbalized understanding;Returned demonstration;Verbal cues required;Tactile cues required;Need further instruction              PT Short Term Goals - 04/15/21 1000       PT SHORT TERM GOAL #1   Title Be independent with initial home exercise program for self-management of symptoms.    Baseline Initial HEP to be established at visit 2 as appropriate (03/13/2021); Initial HEP established visit 2 (03/18/2021);    Time 3    Period Weeks    Status Achieved    Target Date 04/03/21               PT Long Term Goals - 05/29/21 1108       PT LONG TERM GOAL #1   Title Be independent with a long-term home exercise program for self-management of symptoms.    Baseline Initial HEP to be established at visit 2 as appropriate (03/13/2021); Initial HEP provided visit 2 (03/18/2021); continues to participate in appropriate HEP (05/29/2021);    Time 12    Period Weeks    Status Partially Met   TARGET DATE FOR ALL LONG TERM GOALS: 06/01/2021, updated to 08/21/2021 for unmet goals   Target Date 08/21/21      PT LONG TERM GOAL #2   Title Demonstrate improved FOTO score by 10 units to demonstrate improvement in overall condition and self-reported functional ability.    Baseline to be measured vsiti 2 as appropriate (03/13/2021); 51 (03/18/2021); 51 (04/15/21): 54 (05/29/2021);    Time 12    Period Weeks    Status Partially Met      PT LONG TERM GOAL #3   Title Patient will score equal or greater than 25/30 on Functional Gait Assessment to demonstrate reduction in fall risk to low fall risk classification.    Baseline to be measured vsiti 2 as appropriate (03/13/2021); 20/30 moderate fall risk (03/18/2021); 23/30 (05/29/2021);     Time 12    Period Weeks    Status On-going      PT LONG TERM GOAL #4  Title Patient will ambulate equal or greater than 1000 feet during 6 minute walk test to demonstrate improved community mobility.    Baseline to be measured vsiti 2 as appropriate (03/13/2021); 820 feet with SPC(03/18/2021); 1287f, 4/10 back pain peak that resolves upon rest (04/15/21); 1000 feet with increasing B SIJ pain (05/29/2021);    Time 12    Period Weeks    Status Achieved      PT LONG TERM GOAL #5   Title Complete community, work and/or recreational activities with 50% less limitation due to current condition.    Baseline Functional Limitations: difficulty with activities that require standing balance including outside activities, taking the puppy out, prolonged standing, household chores, community ambulation, grooming, climbing stairs, yard work, sleeping, fall risk, etc. cannot do what he wants to outside because he is afraid to fall (03/13/2021); reports improvements in taking the dog in the front lawn with the leach, wants to be able to walk the dog on trails at the park, has had no falls since he started PT, would like to be able to walk better on uneve ground, has noticed improvements in his function but continues to have some deficits (05/29/2021);    Time 12    Period Weeks    Status Partially Met                   Plan - 06/05/21 1219     Clinical Impression Statement Patient tolerated treatment well overall. Continued to work on balance, coordination, and ankle strength, mobility. Updated HEP for improved R ankle strength to improve his ability to walk on uneven surfaces with his dog. R ankle does clunk with some movements. Patient would benefit from continued management of limiting condition by skilled physical therapist to address remaining impairments and functional limitations to work towards stated goals and return to PLOF or maximal functional independence.    Personal Factors and Comorbidities  Age;Comorbidity 3+;Past/Current Experience;Fitness;Time since onset of injury/illness/exacerbation    Comorbidities Relevant past medical history and comorbidities include seizure disorder (controlled with meds), hx R ankle fracture (wears an air brace to prevent pain for years), ACDF (05/2018 for myelopathy), R  Carpal tunnel release (06/2019), BPH, symptoms of peripheral neuropathy at all limbs (fine motor skills in B UE affected), CTS, perfuse degenerative changes on lumbar and cervical spine imaging (see chart for details).    Examination-Activity Limitations Bend;Locomotion Level;Stand;Carry;Sleep;Hygiene/Grooming;Caring for Others;Lift;Stairs;Squat    Examination-Participation Restrictions Shop;Community Activity;Meal Prep;Yard Work    SMerchant navy officerEvolving/Moderate complexity    Rehab Potential Fair    PT Frequency 2x / week    PT Duration 12 weeks    PT Treatment/Interventions ADLs/Self Care Home Management;Cryotherapy;Traction;Moist Heat;Electrical Stimulation;DME Instruction;Gait training;Stair training;Functional mobility training;Therapeutic activities;Therapeutic exercise;Balance training;Neuromuscular re-education;Patient/family education;Passive range of motion;Dry needling;Manual techniques;Joint Manipulations;Spinal Manipulations    PT Next Visit Plan balance training, LE strength/power    PT Home Exercise Plan Medbridge Access Code: [email or web address]    Consulted and Agree with Plan of Care Patient             Patient will benefit from skilled therapeutic intervention in order to improve the following deficits and impairments:  Abnormal gait, Decreased knowledge of use of DME, Impaired sensation, Improper body mechanics, Pain, Decreased coordination, Decreased mobility, Postural dysfunction, Decreased activity tolerance, Decreased endurance, Decreased range of motion, Decreased strength, Hypomobility, Impaired perceived functional ability, Impaired UE functional use,  Decreased balance, Difficulty walking, Impaired flexibility  Visit Diagnosis: Unsteadiness on feet  Chronic bilateral low back  pain, unspecified whether sciatica present  Difficulty in walking, not elsewhere classified  History of falling     Problem List Patient Active Problem List   Diagnosis Date Noted   Hearing loss due to cerumen impaction 03/01/2021   Atypical chest pain 07/10/2020   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   Unsteadiness on feet 11/08/2019   History of spinal surgery 01/17/2019   Neck pain 01/17/2019   Weakness of hand 01/17/2019   Benign prostatic hyperplasia with urinary obstruction 71/05/2693   Systolic murmur 85/46/2703   Right carpal tunnel syndrome 02/02/2018   Ulnar neuropathy at elbow, left 02/02/2018   Peripheral neuropathy 01/26/2018   Bilateral buttock pain 06/29/2016   Failed vision screen 06/29/2016   Advanced care planning/counseling discussion 06/21/2015   Medicare annual wellness visit, subsequent 06/18/2014   Health maintenance examination 06/18/2014   Chronic ankle pain 06/18/2014   Incomplete emptying of bladder 06/07/2013   Vitamin D deficiency 05/20/2013   THYROID NODULE 05/17/2007   HLD (hyperlipidemia) 05/17/2007   TREMOR, ESSENTIAL 05/17/2007   Essential hypertension 05/17/2007   Cervical spondylosis with myelopathy and radiculopathy 05/17/2007   Seizure disorder ([place]) 05/17/2007    Everlean Alstrom. Graylon Good, PT, DPT 06/05/21, 12:20 PM   [facility] [facility] [address], [place], [address] Phone: [phone]   Fax:  [phone]  Name: Mark Holmes MRN: [id number] Date of Birth: November 01, 1935

## 2021-06-10 ENCOUNTER — Ambulatory Visit: Payer: Medicare Other | Admitting: Physical Therapy

## 2021-06-10 ENCOUNTER — Encounter: Payer: Self-pay | Admitting: Physical Therapy

## 2021-06-10 ENCOUNTER — Other Ambulatory Visit: Payer: Self-pay

## 2021-06-10 DIAGNOSIS — R262 Difficulty in walking, not elsewhere classified: Secondary | ICD-10-CM

## 2021-06-10 DIAGNOSIS — R2681 Unsteadiness on feet: Secondary | ICD-10-CM | POA: Diagnosis not present

## 2021-06-10 DIAGNOSIS — M545 Low back pain, unspecified: Secondary | ICD-10-CM

## 2021-06-10 DIAGNOSIS — Z9181 History of falling: Secondary | ICD-10-CM

## 2021-06-10 NOTE — Therapy (Signed)
[facility] [address], [place], [address] Phone: [phone]   Fax:  [phone]  Physical Therapy Treatment  Patient Details  Name: Mark Holmes MRN: [id number] Date of Birth: 10-31-1935 Referring Provider (PT): Allene Dillon, NP   Encounter Date: 06/10/2021   PT End of Session - 06/10/21 1030     Visit Number 23    Number of Visits 44    Date for PT Re-Evaluation 08/21/21    Authorization Type [employer] MEDICARE reporting period from 04/15/2021    Progress Note Due on Visit 30    PT Start Time 1025    PT Stop Time 1105    PT Time Calculation (min) 40 min    Equipment Utilized During Treatment Gait belt    Activity Tolerance Patient tolerated treatment well    Behavior During Therapy [facility] for tasks assessed/performed             Past Medical History:  Diagnosis Date   Benign prostatic hypertrophy with elevated PSA    per Dr. Jacqlyn Larsen   History of CT scan of brain 1992   normal   Hyperglycemia 01/2006   102   Hyperlipemia    Hypertension    Seizure disorder [facility])     Past Surgical History:  Procedure Laterality Date   ANTERIOR CERVICAL DECOMP/DISCECTOMY FUSION  05/2018   for myelopathy -C3/4 ([facility])   CARPAL TUNNEL RELEASE Right 06/2019   ([facility])   CATARACT EXTRACTION W/ INTRAOCULAR LENS IMPLANT Right 2012   [place] [facility]   History of EEG  1985   normal   hospitalization  1981   observation for seizuare   TONSILLECTOMY     TRANSURETHRAL RESECTION OF PROSTATE  10/2005   [facility] RESECTION OF PROSTATE  08/2017   (rpt) [facility]    There were no vitals filed for this visit.   Subjective Assessment - 06/10/21 1025     Subjective Patient reports he is feeling well today. States he thinks a total ankle replacement is the only thing that will solve his right ankle pain. R ankle feels pretty good today. It hurt for less than a day after last session. No falls  since last session. Continues to walk dog on front lawn. Brought a collection of information he has put together about his nerve conduction test that he has used to usnderstand it better to share with the PT.    Pertinent History Patient is a 86 y.o. male who presents to outpatient physical therapy with a referral for medical diagnosis difficulty balancing, lumbar DDD (degenerative disc disease) with request for balance therapy. This patient's chief complaints consist of discomfort of the B LE below the knee, pain in the low back with standing, and difficulty balancing in the setting of chronic R ankle pain, B hand neuropathy symptoms and history of cervical spine disorder with ACDF at C3-4 for myelopathy leading to the following functional deficits: cannot do what he wants to outside because he is afraid to fall, taking the puppy out, prolonged standing, household chores, community ambulation, grooming, climbing stairs, yard work, sleeping, fall risk, etc. .  Relevant past medical history and comorbidities include seizure disorder (controlled with meds), hx R ankle fracture (wears an air brace to prevent pain for years), ACDF (05/2018 for myelopathy), R  Carpal tunnel release (06/2019), BPH, symptoms of peripheral neuropathy at all limbs (fine motor skills in B UE affected), CTS, perfuse degenerative changes on  lumbar and cervical spine imaging (see chart for details).    Limitations Standing;Walking;Sitting;Other (comment);House hold activities    How long can you sit comfortably? "a long time' (but will feel it when he stands up)    How long can you stand comfortably? can finish the task but gets really uncomfortable in the back    Diagnostic tests Cervical spine MRI report 11/29/2020: " IMPRESSION:  1. Neurofibroma of the left C2 nerve as seen previously, maximal  transverse diameter 11-12 mm.  2. Previous ACDF C3-4. Canal stenosis remains at this level with AP  diameter of 7 mm, without worsening. No actual  cord compression.  3. Spondylosis and facet arthropathy with degenerative  anterolisthesis of C5-6 and C7-T1. Canal narrowing but no cord  compression.  4. Foraminal stenosis primarily due to facet osteophytes and  uncovertebral osteophytes that could cause neural compression  bilateral at C2-3, bilateral at C3-4, bilateral at C4-5, bilateral  but left more than right at C5-6, bilateral but left more than right  at C6-7 and bilateral but left more than right at C7-T1." Lumbar spine MRI report 11/29/2020: "IMPRESSION:  1. Degenerative disc disease and degenerative facet disease  throughout the lumbar region which could be associated with back  pain. Thoracolumbar scoliotic curvature.  2. L2-3: Moderate central canal stenosis. Bilateral lateral recess  and foraminal stenosis left worse than right. Neural compression  could occur at this level, particularly on the left.  3. L3-4: Bilateral lateral recess and foraminal stenosis, right  worse than left. Neural compression could occur on either side,  particularly on the right.  4. L4-5: Bilateral lateral recess and foraminal stenosis, right more  than left. Neural compression could occur on either side,  particularly the right.  5. L5-S1: Bilateral foraminal narrowing with some potential to  affect either exiting L5 nerve."    Patient Stated Goals he would like to walk "like normal."    Currently in Pain? No/denies            OBJECTIVE   FOTO = 54 (05/29/2021);   TREATMENT:  Shoes/R ankle brace doffed until exiting clinic.   Neuromuscular Re-education: to improve, balance, postural strength, muscle activation patterns, and stabilization strength required for functional activities (CGA - min A as needed for safety): - standing static balance: eyes open semi-narrow stance on compliant surface, rotating head back and forth horizontally, 1x20 each side.   - tandem stance static balance on airex with U UE support as needed, 60 seconds each side.  - Balance  circuit: 4 double taps on cones each foot, side stepping over 6 inch hurdle each direction, one sit <> stand pushing 10# KB overhead, and one step up per circuit. x1 circuits leading with each foot. CGA for safety. No need for seated rest. (patient required hand held min A or U UE support for ~ 0% cone taps with R foot and 80% for cone taps with L foot. Increased difficulty today)   Therapeutic exercise: to centralize symptoms and improve ROM, strength, muscular endurance, and activity tolerance required for successful completion of functional activities.  - Total gym single leg heel raises off edge of platform with min A to hold up other leg and find position, L 3x10-30 at level 18, R 3x10-20 at level 6-7. - seated R plantar flexion with black theraband, 2x10 - seated R ankle eversion against green theraband, 1x10 - seated R ankle inversion AROM figure 4 position, 1x10-20 (limited active motion). - seated B heel raise with  inversion with yellow theraband looped around heels, small ball between knees to prevent compensation, 1x20 - step up to 8.5 inch stool with left foot and down with right foot focusing on R side pushing off and R foot eccentric lower touching with forefoot first. L UE support. 1x10.     HOME EXERCISE PROGRAM Access Code: [email or web address] URL: [email or web address] Date: 06/05/2021 Prepared by: Rosita Kea   Exercises Sit to Stand with Arm Reach and Jump - 2-3 x weekly - 3 sets - 10 reps Half Tandem Stance Balance with Head Rotation - 2-3 x weekly - 1 sets - 20 reps Seated Ankle Plantar Flexion with Resistance Loop - 1 x daily - 3 sets - 10 reps Seated Ankle Eversion with Resistance - 1 x daily - 3 sets - 10 reps      [email or web address] CG [id number] Home Exercise Program [[id number]]   LUNGE with something to hold onto beside you.  -  Repeat 10 Times, Complete 2 Sets, Perform 3 Times a Week   MINI LATERAL LUNGE -  Repeat 20 Times, Complete 1 Set, Perform 3 Times a Week   CUP  - TOE TAP AND HOLD -  Repeat 20 Times, Complete 1 Set, Perform 3 Times a Week   JOGGING IN PLACE -  Duration 20 Seconds, Complete 3 Sets, Perform 3 Times a Week   Incline Heel Raises PF -  Repeat 10 Times, Complete 3 Sets, Perform 3 Times a [place] [[id number]]   Post Tib Activation -  Repeat 10 Times, Hold 1 Second(s), Complete 3 Sets, Perform 1 Times a Day      PT Education - 06/10/21 1030     Education Details exercise purpose/form. self-management techniques    Person(s) Educated Patient    Methods Explanation;Demonstration;Tactile cues;Verbal cues    Comprehension Verbalized understanding;Returned demonstration;Verbal cues required;Tactile cues required;Need further instruction              PT Short Term Goals - 04/15/21 1000       PT SHORT TERM GOAL #1   Title Be independent with initial home exercise program for self-management of symptoms.    Baseline Initial HEP to be established at visit 2 as appropriate (03/13/2021); Initial HEP established visit 2 (03/18/2021);    Time 3    Period Weeks    Status Achieved    Target Date 04/03/21               PT Long Term Goals - 05/29/21 1108       PT LONG TERM GOAL #1   Title Be independent with a long-term home exercise program for self-management of symptoms.    Baseline Initial HEP to be established at visit 2 as appropriate (03/13/2021); Initial HEP provided visit 2 (03/18/2021); continues to participate in appropriate HEP (05/29/2021);    Time 12    Period Weeks    Status Partially Met   TARGET DATE FOR ALL LONG TERM GOALS: 06/01/2021, updated to 08/21/2021 for unmet goals   Target Date 08/21/21      PT LONG TERM GOAL #2   Title Demonstrate improved FOTO score by 10 units to demonstrate improvement in overall condition and self-reported functional ability.    Baseline to be measured vsiti 2 as appropriate (03/13/2021); 51 (03/18/2021); 51 (04/15/21): 54 (05/29/2021);    Time 12    Period Weeks     Status Partially Met      PT LONG TERM GOAL #3   Title  Patient will score equal or greater than 25/30 on Functional Gait Assessment to demonstrate reduction in fall risk to low fall risk classification.    Baseline to be measured vsiti 2 as appropriate (03/13/2021); 20/30 moderate fall risk (03/18/2021); 23/30 (05/29/2021);    Time 12    Period Weeks    Status On-going      PT LONG TERM GOAL #4   Title Patient will ambulate equal or greater than 1000 feet during 6 minute walk test to demonstrate improved community mobility.    Baseline to be measured vsiti 2 as appropriate (03/13/2021); 820 feet with SPC(03/18/2021); 1234f, 4/10 back pain peak that resolves upon rest (04/15/21); 1000 feet with increasing B SIJ pain (05/29/2021);    Time 12    Period Weeks    Status Achieved      PT LONG TERM GOAL #5   Title Complete community, work and/or recreational activities with 50% less limitation due to current condition.    Baseline Functional Limitations: difficulty with activities that require standing balance including outside activities, taking the puppy out, prolonged standing, household chores, community ambulation, grooming, climbing stairs, yard work, sleeping, fall risk, etc. cannot do what he wants to outside because he is afraid to fall (03/13/2021); reports improvements in taking the dog in the front lawn with the leach, wants to be able to walk the dog on trails at the park, has had no falls since he started PT, would like to be able to walk better on uneve ground, has noticed improvements in his function but continues to have some deficits (05/29/2021);    Time 12    Period Weeks    Status Partially Met                   Plan - 06/10/21 1145     Clinical Impression Statement Patient tolerated treatment well overall with some difficulty due to ataxia, and LE instability at times. Had a lot more trouble with the balance course today so only completed 2 times. Session concentrated on  interventions addressing balance and ankle strength. Patient able to advance to more difficult bands for eversion and plantarflexion but continues to really struggle with inversion without being able to complete inversion against gravity. Patient would benefit from continued management of limiting condition by skilled physical therapist to address remaining impairments and functional limitations to work towards stated goals and return to PLOF or maximal functional independence.    Personal Factors and Comorbidities Age;Comorbidity 3+;Past/Current Experience;Fitness;Time since onset of injury/illness/exacerbation    Comorbidities Relevant past medical history and comorbidities include seizure disorder (controlled with meds), hx R ankle fracture (wears an air brace to prevent pain for years), ACDF (05/2018 for myelopathy), R  Carpal tunnel release (06/2019), BPH, symptoms of peripheral neuropathy at all limbs (fine motor skills in B UE affected), CTS, perfuse degenerative changes on lumbar and cervical spine imaging (see chart for details).    Examination-Activity Limitations Bend;Locomotion Level;Stand;Carry;Sleep;Hygiene/Grooming;Caring for Others;Lift;Stairs;Squat    Examination-Participation Restrictions Shop;Community Activity;Meal Prep;Yard Work    SMerchant navy officerEvolving/Moderate complexity    Rehab Potential Fair    PT Frequency 2x / week    PT Duration 12 weeks    PT Treatment/Interventions ADLs/Self Care Home Management;Cryotherapy;Traction;Moist Heat;Electrical Stimulation;DME Instruction;Gait training;Stair training;Functional mobility training;Therapeutic activities;Therapeutic exercise;Balance training;Neuromuscular re-education;Patient/family education;Passive range of motion;Dry needling;Manual techniques;Joint Manipulations;Spinal Manipulations    PT Next Visit Plan balance training, LE strength/power    PT Home Exercise Plan Medbridge Access Code: [email or web address]  Consulted  and Agree with Plan of Care Patient             Patient will benefit from skilled therapeutic intervention in order to improve the following deficits and impairments:  Abnormal gait, Decreased knowledge of use of DME, Impaired sensation, Improper body mechanics, Pain, Decreased coordination, Decreased mobility, Postural dysfunction, Decreased activity tolerance, Decreased endurance, Decreased range of motion, Decreased strength, Hypomobility, Impaired perceived functional ability, Impaired UE functional use, Decreased balance, Difficulty walking, Impaired flexibility  Visit Diagnosis: Unsteadiness on feet  Chronic bilateral low back pain, unspecified whether sciatica present  Difficulty in walking, not elsewhere classified  History of falling     Problem List Patient Active Problem List   Diagnosis Date Noted   Hearing loss due to cerumen impaction 03/01/2021   Atypical chest pain 07/10/2020   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   Unsteadiness on feet 11/08/2019   History of spinal surgery 01/17/2019   Neck pain 01/17/2019   Weakness of hand 01/17/2019   Benign prostatic hyperplasia with urinary obstruction 14/60/4799   Systolic murmur 87/21/5872   Right carpal tunnel syndrome 02/02/2018   Ulnar neuropathy at elbow, left 02/02/2018   Peripheral neuropathy 01/26/2018   Bilateral buttock pain 06/29/2016   Failed vision screen 06/29/2016   Advanced care planning/counseling discussion 06/21/2015   Medicare annual wellness visit, subsequent 06/18/2014   Health maintenance examination 06/18/2014   Chronic ankle pain 06/18/2014   Incomplete emptying of bladder 06/07/2013   Vitamin D deficiency 05/20/2013   THYROID NODULE 05/17/2007   HLD (hyperlipidemia) 05/17/2007   TREMOR, ESSENTIAL 05/17/2007   Essential hypertension 05/17/2007   Cervical spondylosis with myelopathy and radiculopathy 05/17/2007   Seizure disorder ([facility]) 05/17/2007    Everlean Alstrom. Graylon Good, PT, DPT 06/10/21, 11:52  AM   [facility] [address] [place], [address] Phone: [phone]   Fax:  [phone]  Name: Mark Holmes MRN: [id number] Date of Birth: 1935-06-11

## 2021-06-12 ENCOUNTER — Encounter: Payer: Self-pay | Admitting: Physical Therapy

## 2021-06-12 ENCOUNTER — Ambulatory Visit: Payer: Medicare Other | Admitting: Physical Therapy

## 2021-06-12 DIAGNOSIS — M545 Low back pain, unspecified: Secondary | ICD-10-CM

## 2021-06-12 DIAGNOSIS — R2681 Unsteadiness on feet: Secondary | ICD-10-CM | POA: Diagnosis not present

## 2021-06-12 DIAGNOSIS — R262 Difficulty in walking, not elsewhere classified: Secondary | ICD-10-CM

## 2021-06-12 DIAGNOSIS — Z9181 History of falling: Secondary | ICD-10-CM

## 2021-06-12 DIAGNOSIS — G8929 Other chronic pain: Secondary | ICD-10-CM

## 2021-06-12 NOTE — Therapy (Signed)
[facility] [address] [place], [address] Phone: [phone]   Fax:  [phone]  Physical Therapy Treatment  Patient Details  Name: Mark Holmes MRN: [id number] Date of Birth: 1935-03-25 Referring Provider (PT): Allene Dillon, NP   Encounter Date: 06/12/2021   PT End of Session - 06/12/21 1045     Visit Number 24    Number of Visits 44    Date for PT Re-Evaluation 08/21/21    Authorization Type [employer] MEDICARE reporting period from 04/15/2021    Progress Note Due on Visit 30    PT Start Time 1035    PT Stop Time 1115    PT Time Calculation (min) 40 min    Equipment Utilized During Treatment Gait belt    Activity Tolerance Patient tolerated treatment well    Behavior During Therapy [facility] for tasks assessed/performed             Past Medical History:  Diagnosis Date   Benign prostatic hypertrophy with elevated PSA    per Dr. Jacqlyn Larsen   History of CT scan of brain 1992   normal   Hyperglycemia 01/2006   102   Hyperlipemia    Hypertension    Seizure disorder [facility])     Past Surgical History:  Procedure Laterality Date   ANTERIOR CERVICAL DECOMP/DISCECTOMY FUSION  05/2018   for myelopathy -C3/4 ([facility])   CARPAL TUNNEL RELEASE Right 06/2019   ([facility])   CATARACT EXTRACTION W/ INTRAOCULAR LENS IMPLANT Right 2012   [facility]   History of EEG  1985   normal   hospitalization  1981   observation for seizuare   TONSILLECTOMY     TRANSURETHRAL RESECTION OF PROSTATE  10/2005   [place] RESECTION OF PROSTATE  08/2017   (rpt) [place]    There were no vitals filed for this visit.   Subjective Assessment - 06/12/21 1037     Subjective Patient reports he is feeling well today. States he was sore for a while after last PT session. Has been continuing to look up information about his condition and feels that the PT exercises he has been prescribed ar the right  ones. States he can make his ankle sore by going up/down stairs without his brace at home. No falls since last time. No pain upon arrival.    Pertinent History Patient is a 86 y.o. male who presents to outpatient physical therapy with a referral for medical diagnosis difficulty balancing, lumbar DDD (degenerative disc disease) with request for balance therapy. This patient's chief complaints consist of discomfort of the B LE below the knee, pain in the low back with standing, and difficulty balancing in the setting of chronic R ankle pain, B hand neuropathy symptoms and history of cervical spine disorder with ACDF at C3-4 for myelopathy leading to the following functional deficits: cannot do what he wants to outside because he is afraid to fall, taking the puppy out, prolonged standing, household chores, community ambulation, grooming, climbing stairs, yard work, sleeping, fall risk, etc. .  Relevant past medical history and comorbidities include seizure disorder (controlled with meds), hx R ankle fracture (wears an air brace to prevent pain for years), ACDF (05/2018 for myelopathy), R  Carpal tunnel release (06/2019), BPH, symptoms of peripheral neuropathy at all limbs (fine motor skills in B UE affected), CTS, perfuse degenerative changes on lumbar and cervical spine imaging (see chart for details).    Limitations  Standing;Walking;Sitting;Other (comment);House hold activities    How long can you sit comfortably? "a long time' (but will feel it when he stands up)    How long can you stand comfortably? can finish the task but gets really uncomfortable in the back    Diagnostic tests Cervical spine MRI report 11/29/2020: " IMPRESSION:  1. Neurofibroma of the left C2 nerve as seen previously, maximal  transverse diameter 11-12 mm.  2. Previous ACDF C3-4. Canal stenosis remains at this level with AP  diameter of 7 mm, without worsening. No actual cord compression.  3. Spondylosis and facet arthropathy with  degenerative  anterolisthesis of C5-6 and C7-T1. Canal narrowing but no cord  compression.  4. Foraminal stenosis primarily due to facet osteophytes and  uncovertebral osteophytes that could cause neural compression  bilateral at C2-3, bilateral at C3-4, bilateral at C4-5, bilateral  but left more than right at C5-6, bilateral but left more than right  at C6-7 and bilateral but left more than right at C7-T1." Lumbar spine MRI report 11/29/2020: "IMPRESSION:  1. Degenerative disc disease and degenerative facet disease  throughout the lumbar region which could be associated with back  pain. Thoracolumbar scoliotic curvature.  2. L2-3: Moderate central canal stenosis. Bilateral lateral recess  and foraminal stenosis left worse than right. Neural compression  could occur at this level, particularly on the left.  3. L3-4: Bilateral lateral recess and foraminal stenosis, right  worse than left. Neural compression could occur on either side,  particularly on the right.  4. L4-5: Bilateral lateral recess and foraminal stenosis, right more  than left. Neural compression could occur on either side,  particularly the right.  5. L5-S1: Bilateral foraminal narrowing with some potential to  affect either exiting L5 nerve."    Patient Stated Goals he would like to walk "like normal."    Currently in Pain? No/denies              OBJECTIVE   FOTO = 54 (05/29/2021);   TREATMENT:  Shoes/R ankle brace doffed    Neuromuscular Re-education: to improve, balance, postural strength, muscle activation patterns, and stabilization strength required for functional activities (CGA - min A as needed for safety): - standing static balance: eyes open semi-narrow stance on compliant surface, rotating head back and forth horizontally, 1x20 each side.   - tandem stance static balance on airex with U UE support as needed, 60 seconds each side.   Hurdle activities (6 inch): - side step and push back over hurdle, alternating sides,  1x20 each side, intermittent UE support, CGA for safety. (Pt stands between two hurdles one placed on each side of patient, steps over hurdle to one side with one foot, then pushes back to stand in the middle of the hurdles, repeat on other side).  - forward step and push back to backward step and push back with intermittent UE support. 1x10 each side (CGA) (pt stands between 2 hurdles, one in front and one behind; steps forward with on leg over hurdle then pushes back to staring position, then steps backwards over back hurdle with same leg and pushes back to starting position). - step on/off airex pad with each foot (stradle/together) with intermittant UE support, 1x10 leading with each foot.   Therapeutic exercise: to centralize symptoms and improve ROM, strength, muscular endurance, and activity tolerance required for successful completion of functional activities.  - seated B heel raise with inversion with yellow/red theraband looped around heels, small ball between knees and yoga  block between forefeet to prevent compensation, 2x20 - step up to 12 inch step focusing on R side pushing off and R foot eccentric lower touching with forefoot first when stepping up with left foot and R LE strength when stepping up with R LE. 2x10 L on step and 1x10 R on step (Discontinued on R due to excessive buckling and instability in R ankle/knee) - Lateral step up to 4 inch step with R LE with R UE support, 1x10 - seated ankle inversion in figure 4 position, several reps on each side to improve understanding of desired motion on R ankle (unable to perform with R ankle).   Pt required multimodal cuing for proper technique and to facilitate improved neuromuscular control, strength, range of motion, and functional ability resulting in improved performance and form. (CGA-MinA for standing activities due to occasional giving way of B LE).     HOME EXERCISE PROGRAM Access Code: [email or web address] URL:  [email or web address] Date: 06/05/2021 Prepared by: Rosita Kea   Exercises Sit to Stand with Arm Reach and Jump - 2-3 x weekly - 3 sets - 10 reps Half Tandem Stance Balance with Head Rotation - 2-3 x weekly - 1 sets - 20 reps Seated Ankle Plantar Flexion with Resistance Loop - 1 x daily - 3 sets - 10 reps Seated Ankle Eversion with Resistance - 1 x daily - 3 sets - 10 reps      [email or web address] CG [id number] Home Exercise Program [[id number]]   LUNGE with something to hold onto beside you.  -  Repeat 10 Times, Complete 2 Sets, Perform 3 Times a Week   MINI LATERAL LUNGE -  Repeat 20 Times, Complete 1 Set, Perform 3 Times a Week   CUP - TOE TAP AND HOLD -  Repeat 20 Times, Complete 1 Set, Perform 3 Times a Week   JOGGING IN PLACE -  Duration 20 Seconds, Complete 3 Sets, Perform 3 Times a Week   Incline Heel Raises PF -  Repeat 10 Times, Complete 3 Sets, Perform 3 Times a [place] [[id number]]   Post Tib Activation -  Repeat 10 Times, Hold 1 Second(s), Complete 3 Sets, Perform 1 Times a Day    PT Education - 06/12/21 1045     Education Details exercise purpose/form. self-management techniques    Person(s) Educated Patient    Methods Explanation;Demonstration;Tactile cues;Verbal cues    Comprehension Verbalized understanding;Returned demonstration;Verbal cues required;Tactile cues required;Need further instruction              PT Short Term Goals - 04/15/21 1000       PT SHORT TERM GOAL #1   Title Be independent with initial home exercise program for self-management of symptoms.    Baseline Initial HEP to be established at visit 2 as appropriate (03/13/2021); Initial HEP established visit 2 (03/18/2021);    Time 3    Period Weeks    Status Achieved    Target Date 04/03/21               PT Long Term Goals - 05/29/21 1108       PT LONG TERM GOAL #1   Title Be independent with a long-term home exercise program for self-management of  symptoms.    Baseline Initial HEP to be established at visit 2 as appropriate (03/13/2021); Initial HEP provided visit 2 (03/18/2021); continues to participate in appropriate HEP (05/29/2021);    Time 12    Period Weeks    Status Partially  Met   TARGET DATE FOR ALL LONG TERM GOALS: 06/01/2021, updated to 08/21/2021 for unmet goals   Target Date 08/21/21      PT LONG TERM GOAL #2   Title Demonstrate improved FOTO score by 10 units to demonstrate improvement in overall condition and self-reported functional ability.    Baseline to be measured vsiti 2 as appropriate (03/13/2021); 51 (03/18/2021); 51 (04/15/21): 54 (05/29/2021);    Time 12    Period Weeks    Status Partially Met      PT LONG TERM GOAL #3   Title Patient will score equal or greater than 25/30 on Functional Gait Assessment to demonstrate reduction in fall risk to low fall risk classification.    Baseline to be measured vsiti 2 as appropriate (03/13/2021); 20/30 moderate fall risk (03/18/2021); 23/30 (05/29/2021);    Time 12    Period Weeks    Status On-going      PT LONG TERM GOAL #4   Title Patient will ambulate equal or greater than 1000 feet during 6 minute walk test to demonstrate improved community mobility.    Baseline to be measured vsiti 2 as appropriate (03/13/2021); 820 feet with SPC(03/18/2021); 1286f, 4/10 back pain peak that resolves upon rest (04/15/21); 1000 feet with increasing B SIJ pain (05/29/2021);    Time 12    Period Weeks    Status Achieved      PT LONG TERM GOAL #5   Title Complete community, work and/or recreational activities with 50% less limitation due to current condition.    Baseline Functional Limitations: difficulty with activities that require standing balance including outside activities, taking the puppy out, prolonged standing, household chores, community ambulation, grooming, climbing stairs, yard work, sleeping, fall risk, etc. cannot do what he wants to outside because he is afraid to fall (03/13/2021);  reports improvements in taking the dog in the front lawn with the leach, wants to be able to walk the dog on trails at the park, has had no falls since he started PT, would like to be able to walk better on uneve ground, has noticed improvements in his function but continues to have some deficits (05/29/2021);    Time 12    Period Weeks    Status Partially Met                   Plan - 06/12/21 1402     Clinical Impression Statement Patient tolerated treatment well overall but continues to limited by R ankle pain and B LE instability with occasional giving way of B LE (R> L). Returned to some hurdle exercises this session with good carry over from when performed last. Required physical assistance at time to improve safety due to giving way. Patient lacks R ankle inversion AROM. Patient would benefit from continued management of limiting condition by skilled physical therapist to address remaining impairments and functional limitations to work towards stated goals and return to PLOF or maximal functional independence.    Personal Factors and Comorbidities Age;Comorbidity 3+;Past/Current Experience;Fitness;Time since onset of injury/illness/exacerbation    Comorbidities Relevant past medical history and comorbidities include seizure disorder (controlled with meds), hx R ankle fracture (wears an air brace to prevent pain for years), ACDF (05/2018 for myelopathy), R  Carpal tunnel release (06/2019), BPH, symptoms of peripheral neuropathy at all limbs (fine motor skills in B UE affected), CTS, perfuse degenerative changes on lumbar and cervical spine imaging (see chart for details).    Examination-Activity Limitations Bend;Locomotion Level;Stand;Carry;Sleep;Hygiene/Grooming;Caring for Others;Lift;Stairs;Squat  Examination-Participation Restrictions Shop;Community Activity;Meal Prep;Yard Work    [employer] Evolving/Moderate complexity    Rehab Potential Fair    PT Frequency 2x  / week    PT Duration 12 weeks    PT Treatment/Interventions ADLs/Self Care Home Management;Cryotherapy;Traction;Moist Heat;Electrical Stimulation;DME Instruction;Gait training;Stair training;Functional mobility training;Therapeutic activities;Therapeutic exercise;Balance training;Neuromuscular re-education;Patient/family education;Passive range of motion;Dry needling;Manual techniques;Joint Manipulations;Spinal Manipulations    PT Next Visit Plan balance training, LE strength/power, R ankle strengthening as tolerated    PT Home Exercise Plan Medbridge Access Code: [email or web address]    Consulted and Agree with Plan of Care Patient             Patient will benefit from skilled therapeutic intervention in order to improve the following deficits and impairments:  Abnormal gait, Decreased knowledge of use of DME, Impaired sensation, Improper body mechanics, Pain, Decreased coordination, Decreased mobility, Postural dysfunction, Decreased activity tolerance, Decreased endurance, Decreased range of motion, Decreased strength, Hypomobility, Impaired perceived functional ability, Impaired UE functional use, Decreased balance, Difficulty walking, Impaired flexibility  Visit Diagnosis: Unsteadiness on feet  Chronic bilateral low back pain, unspecified whether sciatica present  Difficulty in walking, not elsewhere classified  History of falling     Problem List Patient Active Problem List   Diagnosis Date Noted   Hearing loss due to cerumen impaction 03/01/2021   Atypical chest pain 07/10/2020   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   Unsteadiness on feet 11/08/2019   History of spinal surgery 01/17/2019   Neck pain 01/17/2019   Weakness of hand 01/17/2019   Benign prostatic hyperplasia with urinary obstruction 85/01/7740   Systolic murmur 28/78/6767   Right carpal tunnel syndrome 02/02/2018   Ulnar neuropathy at elbow, left 02/02/2018   Peripheral neuropathy 01/26/2018   Bilateral buttock pain  06/29/2016   Failed vision screen 06/29/2016   Advanced care planning/counseling discussion 06/21/2015   Medicare annual wellness visit, subsequent 06/18/2014   Health maintenance examination 06/18/2014   Chronic ankle pain 06/18/2014   Incomplete emptying of bladder 06/07/2013   Vitamin D deficiency 05/20/2013   THYROID NODULE 05/17/2007   HLD (hyperlipidemia) 05/17/2007   TREMOR, ESSENTIAL 05/17/2007   Essential hypertension 05/17/2007   Cervical spondylosis with myelopathy and radiculopathy 05/17/2007   Seizure disorder ([place]) 05/17/2007    Everlean Alstrom. Graylon Good, PT, DPT 06/12/21, 2:03 PM   [facility] [address] [place], [address] Phone: [phone]   Fax:  [phone]  Name: CREE KUNERT MRN: [id number] Date of Birth: 1935/07/01

## 2021-06-17 ENCOUNTER — Ambulatory Visit: Payer: Medicare Other | Admitting: Physical Therapy

## 2021-06-17 DIAGNOSIS — R262 Difficulty in walking, not elsewhere classified: Secondary | ICD-10-CM

## 2021-06-17 DIAGNOSIS — G8929 Other chronic pain: Secondary | ICD-10-CM

## 2021-06-17 DIAGNOSIS — R2681 Unsteadiness on feet: Secondary | ICD-10-CM

## 2021-06-17 DIAGNOSIS — Z9181 History of falling: Secondary | ICD-10-CM

## 2021-06-17 DIAGNOSIS — M545 Low back pain, unspecified: Secondary | ICD-10-CM

## 2021-06-17 NOTE — Therapy (Addendum)
[facility] [address], [place], [address] Phone: [phone]   Fax:  [phone]  Physical Therapy Treatment  Patient Details  Name: Mark Holmes MRN: [id number] Date of Birth: 1935/07/09 Referring Provider (PT): Allene Dillon, NP   Encounter Date: 06/17/2021   PT End of Session - 06/17/21 1034     Visit Number 25    Number of Visits 44    Date for PT Re-Evaluation 08/21/21    Authorization Type [employer] MEDICARE reporting period from 04/15/2021    Progress Note Due on Visit 30    PT Start Time 1031    PT Stop Time 1112    PT Time Calculation (min) 41 min    Equipment Utilized During Treatment Gait belt    Activity Tolerance Patient tolerated treatment well    Behavior During Therapy [facility] for tasks assessed/performed             Past Medical History:  Diagnosis Date   Benign prostatic hypertrophy with elevated PSA    per Dr. Jacqlyn Larsen   History of CT scan of brain 1992   normal   Hyperglycemia 01/2006   102   Hyperlipemia    Hypertension    Seizure disorder [facility])     Past Surgical History:  Procedure Laterality Date   ANTERIOR CERVICAL DECOMP/DISCECTOMY FUSION  05/2018   for myelopathy -C3/4 ([facility])   CARPAL TUNNEL RELEASE Right 06/2019   ([facility])   CATARACT EXTRACTION W/ INTRAOCULAR LENS IMPLANT Right 2012   [facility]   History of EEG  1985   normal   hospitalization  1981   observation for seizuare   TONSILLECTOMY     TRANSURETHRAL RESECTION OF PROSTATE  10/2005   [place] RESECTION OF PROSTATE  08/2017   (rpt) [facility]    There were no vitals filed for this visit.   Subjective Assessment - 06/17/21 1031     Subjective Pt reports he feels as though he may have pulled a muscle in his lower back on the right side.  Pt notes he may have injured it when walking the dog when it ran one way and he was holding her to go the other way.  Pt note that she  is rambunctious at times and just gets excited.    Pertinent History Patient is a 86 y.o. male who presents to outpatient physical therapy with a referral for medical diagnosis difficulty balancing, lumbar DDD (degenerative disc disease) with request for balance therapy. This patient's chief complaints consist of discomfort of the B LE below the knee, pain in the low back with standing, and difficulty balancing in the setting of chronic R ankle pain, B hand neuropathy symptoms and history of cervical spine disorder with ACDF at C3-4 for myelopathy leading to the following functional deficits: cannot do what he wants to outside because he is afraid to fall, taking the puppy out, prolonged standing, household chores, community ambulation, grooming, climbing stairs, yard work, sleeping, fall risk, etc. .  Relevant past medical history and comorbidities include seizure disorder (controlled with meds), hx R ankle fracture (wears an air brace to prevent pain for years), ACDF (05/2018 for myelopathy), R  Carpal tunnel release (06/2019), BPH, symptoms of peripheral neuropathy at all limbs (fine motor skills in B UE affected), CTS, perfuse degenerative changes on lumbar and cervical spine imaging (see chart for details).    Limitations Standing;Walking;Sitting;Other (comment);House hold activities  How long can you sit comfortably? "a long time' (but will feel it when he stands up)    How long can you stand comfortably? can finish the task but gets really uncomfortable in the back    Diagnostic tests Cervical spine MRI report 11/29/2020: " IMPRESSION:  1. Neurofibroma of the left C2 nerve as seen previously, maximal  transverse diameter 11-12 mm.  2. Previous ACDF C3-4. Canal stenosis remains at this level with AP  diameter of 7 mm, without worsening. No actual cord compression.  3. Spondylosis and facet arthropathy with degenerative  anterolisthesis of C5-6 and C7-T1. Canal narrowing but no cord  compression.  4.  Foraminal stenosis primarily due to facet osteophytes and  uncovertebral osteophytes that could cause neural compression  bilateral at C2-3, bilateral at C3-4, bilateral at C4-5, bilateral  but left more than right at C5-6, bilateral but left more than right  at C6-7 and bilateral but left more than right at C7-T1." Lumbar spine MRI report 11/29/2020: "IMPRESSION:  1. Degenerative disc disease and degenerative facet disease  throughout the lumbar region which could be associated with back  pain. Thoracolumbar scoliotic curvature.  2. L2-3: Moderate central canal stenosis. Bilateral lateral recess  and foraminal stenosis left worse than right. Neural compression  could occur at this level, particularly on the left.  3. L3-4: Bilateral lateral recess and foraminal stenosis, right  worse than left. Neural compression could occur on either side,  particularly on the right.  4. L4-5: Bilateral lateral recess and foraminal stenosis, right more  than left. Neural compression could occur on either side,  particularly the right.  5. L5-S1: Bilateral foraminal narrowing with some potential to  affect either exiting L5 nerve."    Patient Stated Goals he would like to walk "like normal."    Currently in Pain? No/denies                     TREATMENT:  Shoes/R ankle brace doffed until exiting clinic.   Neuromuscular Re-education: to improve, balance, postural strength, muscle activation patterns, and stabilization strength required for functional activities (CGA - min A as needed for safety): - standing static balance: eyes open semi-narrow stance on compliant surface, rotating head back and forth horizontally, 2x15 each side.    - tandem stance static balance on airex with U UE support as needed, 60 seconds each side.  - side-stepping on airex balance beam with CGA for safety, x4 each direction (pt with increased difficulty and required assistance from pt in order to prevent descent to floor)    Therapeutic exercise: to centralize symptoms and improve ROM, strength, muscular endurance, and activity tolerance required for successful completion of functional activities.  - Total gym single leg heel raises off edge of platform with min A to hold up other leg and find position, L 3x10-30 at level 18, R 3x10-20 at level 6-7. (not today) - seated R plantar flexion with black theraband, 2x10 - seated R ankle eversion against green theraband, 1x10 - seated R ankle inversion AROM figure 4 position, 1x10-20 (limited active motion, therapist provided overpressure to assist with full ROM) - seated B heel raise with inversion with yellow theraband looped around heels, small ball between knees to prevent compensation, 1x20 (not today) - step up to 8.5 inch stool with left foot and down with right foot focusing on R side pushing off and R foot eccentric lower touching with forefoot first. L UE support. 1x10 (not today)  Manual Therapy: to increase joint mobility of the ankle to support increased overall ROM and provide relief to ankle musculature. - Supine subtalar medial glide in order to increase eversion of the R ankle, Grades III-IV, multiple bouts - Supine subtalar lateral glide in order to increase inversion of the R ankle, Grades III-IV, multiple bouts - Supine subtalar distraction, 4x30 seconds to increase overall ROM - Supine Talocrual posterior glide, 4x30 seconds to increase overall ROM - Supine R Hamstring stretch, 4x30 seconds with additional nerve flossing and education on how to perform                 PT Short Term Goals - 04/15/21 1000       PT SHORT TERM GOAL #1   Title Be independent with initial home exercise program for self-management of symptoms.    Baseline Initial HEP to be established at visit 2 as appropriate (03/13/2021); Initial HEP established visit 2 (03/18/2021);    Time 3    Period Weeks    Status Achieved    Target Date 04/03/21                PT Long Term Goals - 05/29/21 1108       PT LONG TERM GOAL #1   Title Be independent with a long-term home exercise program for self-management of symptoms.    Baseline Initial HEP to be established at visit 2 as appropriate (03/13/2021); Initial HEP provided visit 2 (03/18/2021); continues to participate in appropriate HEP (05/29/2021);    Time 12    Period Weeks    Status Partially Met   TARGET DATE FOR ALL LONG TERM GOALS: 06/01/2021, updated to 08/21/2021 for unmet goals   Target Date 08/21/21      PT LONG TERM GOAL #2   Title Demonstrate improved FOTO score by 10 units to demonstrate improvement in overall condition and self-reported functional ability.    Baseline to be measured vsiti 2 as appropriate (03/13/2021); 51 (03/18/2021); 51 (04/15/21): 54 (05/29/2021);    Time 12    Period Weeks    Status Partially Met      PT LONG TERM GOAL #3   Title Patient will score equal or greater than 25/30 on Functional Gait Assessment to demonstrate reduction in fall risk to low fall risk classification.    Baseline to be measured vsiti 2 as appropriate (03/13/2021); 20/30 moderate fall risk (03/18/2021); 23/30 (05/29/2021);    Time 12    Period Weeks    Status On-going      PT LONG TERM GOAL #4   Title Patient will ambulate equal or greater than 1000 feet during 6 minute walk test to demonstrate improved community mobility.    Baseline to be measured vsiti 2 as appropriate (03/13/2021); 820 feet with SPC(03/18/2021); 1212f, 4/10 back pain peak that resolves upon rest (04/15/21); 1000 feet with increasing B SIJ pain (05/29/2021);    Time 12    Period Weeks    Status Achieved      PT LONG TERM GOAL #5   Title Complete community, work and/or recreational activities with 50% less limitation due to current condition.    Baseline Functional Limitations: difficulty with activities that require standing balance including outside activities, taking the puppy out, prolonged standing, household chores,  community ambulation, grooming, climbing stairs, yard work, sleeping, fall risk, etc. cannot do what he wants to outside because he is afraid to fall (03/13/2021); reports improvements in taking the dog in the front lawn with  the leach, wants to be able to walk the dog on trails at the park, has had no falls since he started PT, would like to be able to walk better on uneve ground, has noticed improvements in his function but continues to have some deficits (05/29/2021);    Time 12    Period Weeks    Status Partially Met                   Plan - 06/17/21 1034     Clinical Impression Statement Pt responded well to exercises and noted an increase in joint mobility with manual therapy techniques.  Pt noted the manual therapy provided increased ROM as well as increased the sensation of his R foot.  Pt also given nerve flossing as part of HEP and was printed out for pt to continue at home.  Will reassess joint mobility at next visit and continue current POC.  Patient would benefit from continued management of limiting condition by skilled physical therapist to address remaining impairments and functional limitations to work towards stated goals and return to PLOF or maximal functional independence.    Personal Factors and Comorbidities Age;Comorbidity 3+;Past/Current Experience;Fitness;Time since onset of injury/illness/exacerbation    Comorbidities Relevant past medical history and comorbidities include seizure disorder (controlled with meds), hx R ankle fracture (wears an air brace to prevent pain for years), ACDF (05/2018 for myelopathy), R  Carpal tunnel release (06/2019), BPH, symptoms of peripheral neuropathy at all limbs (fine motor skills in B UE affected), CTS, perfuse degenerative changes on lumbar and cervical spine imaging (see chart for details).    Examination-Activity Limitations Bend;Locomotion Level;Stand;Carry;Sleep;Hygiene/Grooming;Caring for Others;Lift;Stairs;Squat     Examination-Participation Restrictions Shop;Community Activity;Meal Prep;Yard Work    [employer] Evolving/Moderate complexity    Rehab Potential Fair    PT Frequency 2x / week    PT Duration 12 weeks    PT Treatment/Interventions ADLs/Self Care Home Management;Cryotherapy;Traction;Moist Heat;Electrical Stimulation;DME Instruction;Gait training;Stair training;Functional mobility training;Therapeutic activities;Therapeutic exercise;Balance training;Neuromuscular re-education;Patient/family education;Passive range of motion;Dry needling;Manual techniques;Joint Manipulations;Spinal Manipulations    PT Next Visit Plan balance training, LE strength/power, R ankle strengthening as tolerated    PT Home Exercise Plan Medbridge Access Code: [email or web address]    Consulted and Agree with Plan of Care Patient             Patient will benefit from skilled therapeutic intervention in order to improve the following deficits and impairments:  Abnormal gait, Decreased knowledge of use of DME, Impaired sensation, Improper body mechanics, Pain, Decreased coordination, Decreased mobility, Postural dysfunction, Decreased activity tolerance, Decreased endurance, Decreased range of motion, Decreased strength, Hypomobility, Impaired perceived functional ability, Impaired UE functional use, Decreased balance, Difficulty walking, Impaired flexibility  Visit Diagnosis: Unsteadiness on feet  Chronic bilateral low back pain, unspecified whether sciatica present  Difficulty in walking, not elsewhere classified  History of falling     Problem List Patient Active Problem List   Diagnosis Date Noted   Hearing loss due to cerumen impaction 03/01/2021   Atypical chest pain 07/10/2020   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   Unsteadiness on feet 11/08/2019   History of spinal surgery 01/17/2019   Neck pain 01/17/2019   Weakness of hand 01/17/2019   Benign prostatic hyperplasia with urinary obstruction  00/76/2263   Systolic murmur 33/54/5625   Right carpal tunnel syndrome 02/02/2018   Ulnar neuropathy at elbow, left 02/02/2018   Peripheral neuropathy 01/26/2018   Bilateral buttock pain 06/29/2016   Failed vision screen 06/29/2016  Advanced care planning/counseling discussion 06/21/2015   Medicare annual wellness visit, subsequent 06/18/2014   Health maintenance examination 06/18/2014   Chronic ankle pain 06/18/2014   Incomplete emptying of bladder 06/07/2013   Vitamin D deficiency 05/20/2013   THYROID NODULE 05/17/2007   HLD (hyperlipidemia) 05/17/2007   TREMOR, ESSENTIAL 05/17/2007   Essential hypertension 05/17/2007   Cervical spondylosis with myelopathy and radiculopathy 05/17/2007   Seizure disorder ([place]) 05/17/2007    Gwenlyn Saran, PT, DPT 06/17/21, 3:02 PM   [facility] [address] [place], [address] Phone: [phone]   Fax:  [phone]  Name: Mark Holmes MRN: [id number] Date of Birth: 1935-01-04

## 2021-06-19 ENCOUNTER — Encounter: Payer: Self-pay | Admitting: Physical Therapy

## 2021-06-19 ENCOUNTER — Ambulatory Visit: Payer: Medicare Other | Admitting: Physical Therapy

## 2021-06-19 DIAGNOSIS — R262 Difficulty in walking, not elsewhere classified: Secondary | ICD-10-CM

## 2021-06-19 DIAGNOSIS — M545 Low back pain, unspecified: Secondary | ICD-10-CM

## 2021-06-19 DIAGNOSIS — Z9181 History of falling: Secondary | ICD-10-CM

## 2021-06-19 DIAGNOSIS — R2681 Unsteadiness on feet: Secondary | ICD-10-CM

## 2021-06-19 DIAGNOSIS — G8929 Other chronic pain: Secondary | ICD-10-CM

## 2021-06-19 NOTE — Therapy (Signed)
[facility] [address], [place], [address] Phone: [phone]   Fax:  [phone]  Physical Therapy Treatment  Patient Details  Name: Mark Holmes MRN: [id number] Date of Birth: March 23, 1935 Referring Provider (PT): Allene Dillon, NP   Encounter Date: 06/19/2021   PT End of Session - 06/19/21 1104     Visit Number 26    Number of Visits 44    Date for PT Re-Evaluation 08/21/21    Authorization Type [employer] MEDICARE reporting period from 04/15/2021    Progress Note Due on Visit 30    PT Start Time 0944    PT Stop Time 1032    PT Time Calculation (min) 48 min    Equipment Utilized During Treatment Gait belt    Activity Tolerance Patient tolerated treatment well    Behavior During Therapy [facility] for tasks assessed/performed             Past Medical History:  Diagnosis Date   Benign prostatic hypertrophy with elevated PSA    per Dr. Jacqlyn Larsen   History of CT scan of brain 1992   normal   Hyperglycemia 01/2006   102   Hyperlipemia    Hypertension    Seizure disorder [facility])     Past Surgical History:  Procedure Laterality Date   ANTERIOR CERVICAL DECOMP/DISCECTOMY FUSION  05/2018   for myelopathy -C3/4 ([facility])   CARPAL TUNNEL RELEASE Right 06/2019   ([facility])   CATARACT EXTRACTION W/ INTRAOCULAR LENS IMPLANT Right 2012   [facility]   History of EEG  1985   normal   hospitalization  1981   observation for seizuare   TONSILLECTOMY     TRANSURETHRAL RESECTION OF PROSTATE  10/2005   [place] RESECTION OF PROSTATE  08/2017   (rpt) [place]    There were no vitals filed for this visit.   Subjective Assessment - 06/19/21 0953     Subjective Pt reports he felt good after last session.  He notes yesterday he attempted to perform the nerve glides and he notes that he felt a good stretch on his ankle.  Pt does have some questions regarding the ue of therabands for ankle  stability exercise.    Pertinent History Patient is a 86 y.o. male who presents to outpatient physical therapy with a referral for medical diagnosis difficulty balancing, lumbar DDD (degenerative disc disease) with request for balance therapy. This patient's chief complaints consist of discomfort of the B LE below the knee, pain in the low back with standing, and difficulty balancing in the setting of chronic R ankle pain, B hand neuropathy symptoms and history of cervical spine disorder with ACDF at C3-4 for myelopathy leading to the following functional deficits: cannot do what he wants to outside because he is afraid to fall, taking the puppy out, prolonged standing, household chores, community ambulation, grooming, climbing stairs, yard work, sleeping, fall risk, etc. .  Relevant past medical history and comorbidities include seizure disorder (controlled with meds), hx R ankle fracture (wears an air brace to prevent pain for years), ACDF (05/2018 for myelopathy), R  Carpal tunnel release (06/2019), BPH, symptoms of peripheral neuropathy at all limbs (fine motor skills in B UE affected), CTS, perfuse degenerative changes on lumbar and cervical spine imaging (see chart for details).    Limitations Standing;Walking;Sitting;Other (comment);House hold activities    How long can you sit comfortably? "a long time' (but will feel it when  he stands up)    How long can you stand comfortably? can finish the task but gets really uncomfortable in the back    Diagnostic tests Cervical spine MRI report 11/29/2020: " IMPRESSION:  1. Neurofibroma of the left C2 nerve as seen previously, maximal  transverse diameter 11-12 mm.  2. Previous ACDF C3-4. Canal stenosis remains at this level with AP  diameter of 7 mm, without worsening. No actual cord compression.  3. Spondylosis and facet arthropathy with degenerative  anterolisthesis of C5-6 and C7-T1. Canal narrowing but no cord  compression.  4. Foraminal stenosis primarily due  to facet osteophytes and  uncovertebral osteophytes that could cause neural compression  bilateral at C2-3, bilateral at C3-4, bilateral at C4-5, bilateral  but left more than right at C5-6, bilateral but left more than right  at C6-7 and bilateral but left more than right at C7-T1." Lumbar spine MRI report 11/29/2020: "IMPRESSION:  1. Degenerative disc disease and degenerative facet disease  throughout the lumbar region which could be associated with back  pain. Thoracolumbar scoliotic curvature.  2. L2-3: Moderate central canal stenosis. Bilateral lateral recess  and foraminal stenosis left worse than right. Neural compression  could occur at this level, particularly on the left.  3. L3-4: Bilateral lateral recess and foraminal stenosis, right  worse than left. Neural compression could occur on either side,  particularly on the right.  4. L4-5: Bilateral lateral recess and foraminal stenosis, right more  than left. Neural compression could occur on either side,  particularly the right.  5. L5-S1: Bilateral foraminal narrowing with some potential to  affect either exiting L5 nerve."    Patient Stated Goals he would like to walk "like normal."    Currently in Pain? No/denies                 TREATMENT:  Shoes/R ankle brace doffed until exiting clinic.   Neuromuscular Re-education: to improve, balance, postural strength, muscle activation patterns, and stabilization strength required for functional activities (CGA - min A as needed for safety): - standing static balance: eyes open semi-narrow stance on compliant surface, rotating head back and forth horizontally, 2x15 each side.    - tandem stance static balance on airex with U UE support as needed, 60 seconds each side.  - static stance on blue inflated disc, 30 sec bouts with CGA for assist - side-stepping on airex balance beam with CGA for safety, x10 each direction (pt with increased difficulty and required assistance from pt in order to  prevent descent to floor)  Pt has posterior lean and requires verbal and tactile cuing for keeping knees straight to prevent collapse of knees    Therapeutic exercise: to centralize symptoms and improve ROM, strength, muscular endurance, and activity tolerance required for successful completion of functional activities.   - seated R plantar flexion with black theraband, 2x10 - seated R ankle eversion against green theraband, 2x15 - seated R ankle inversion AROM figure 4 position, 1x10-20 (limited active motion, therapist provided overpressure to assist with full ROM) - seated ankle plantarflexion/dorsiflexion rolls on pro stretch to facilitate mobility of the R ankle. - seated B heel raise with inversion with yellow theraband looped around heels, small ball between knees to prevent compensation, 1x20 (not today) - step up to 8.5 inch stool with left foot and down with right foot focusing on R side pushing off and R foot eccentric lower touching with forefoot first. L UE support. 1x10 (not today)  Manual Therapy: to increase joint mobility of the ankle to support increased overall ROM and provide relief to ankle musculature. - Supine subtalar medial glide in order to increase eversion of the R ankle, Grades III-IV, multiple bouts - Supine subtalar lateral glide in order to increase inversion of the R ankle, Grades III-IV, multiple bouts - Supine subtalar distraction, 4x30 seconds to increase overall ROM - Supine Talocrual posterior glide, 4x30 seconds to increase overall ROM - Supine R Hamstring stretch, 4x30 seconds with additional nerve flossing and education on how to perform Pt notes marked improvement of inversion/eversion following manual therapy and notes it to feel "so good".            PT Education - 06/19/21 1104     Education Details exercise purpose/form. self-management techniques    Person(s) Educated Patient    Methods Explanation;Demonstration;Tactile cues;Verbal  cues    Comprehension Verbalized understanding;Returned demonstration;Verbal cues required;Tactile cues required;Need further instruction              PT Short Term Goals - 04/15/21 1000       PT SHORT TERM GOAL #1   Title Be independent with initial home exercise program for self-management of symptoms.    Baseline Initial HEP to be established at visit 2 as appropriate (03/13/2021); Initial HEP established visit 2 (03/18/2021);    Time 3    Period Weeks    Status Achieved    Target Date 04/03/21               PT Long Term Goals - 05/29/21 1108       PT LONG TERM GOAL #1   Title Be independent with a long-term home exercise program for self-management of symptoms.    Baseline Initial HEP to be established at visit 2 as appropriate (03/13/2021); Initial HEP provided visit 2 (03/18/2021); continues to participate in appropriate HEP (05/29/2021);    Time 12    Period Weeks    Status Partially Met   TARGET DATE FOR ALL LONG TERM GOALS: 06/01/2021, updated to 08/21/2021 for unmet goals   Target Date 08/21/21      PT LONG TERM GOAL #2   Title Demonstrate improved FOTO score by 10 units to demonstrate improvement in overall condition and self-reported functional ability.    Baseline to be measured vsiti 2 as appropriate (03/13/2021); 51 (03/18/2021); 51 (04/15/21): 54 (05/29/2021);    Time 12    Period Weeks    Status Partially Met      PT LONG TERM GOAL #3   Title Patient will score equal or greater than 25/30 on Functional Gait Assessment to demonstrate reduction in fall risk to low fall risk classification.    Baseline to be measured vsiti 2 as appropriate (03/13/2021); 20/30 moderate fall risk (03/18/2021); 23/30 (05/29/2021);    Time 12    Period Weeks    Status On-going      PT LONG TERM GOAL #4   Title Patient will ambulate equal or greater than 1000 feet during 6 minute walk test to demonstrate improved community mobility.    Baseline to be measured vsiti 2 as appropriate  (03/13/2021); 820 feet with SPC(03/18/2021); 1270f, 4/10 back pain peak that resolves upon rest (04/15/21); 1000 feet with increasing B SIJ pain (05/29/2021);    Time 12    Period Weeks    Status Achieved      PT LONG TERM GOAL #5   Title Complete community, work and/or recreational activities with 50% less  limitation due to current condition.    Baseline Functional Limitations: difficulty with activities that require standing balance including outside activities, taking the puppy out, prolonged standing, household chores, community ambulation, grooming, climbing stairs, yard work, sleeping, fall risk, etc. cannot do what he wants to outside because he is afraid to fall (03/13/2021); reports improvements in taking the dog in the front lawn with the leach, wants to be able to walk the dog on trails at the park, has had no falls since he started PT, would like to be able to walk better on uneve ground, has noticed improvements in his function but continues to have some deficits (05/29/2021);    Time 12    Period Weeks    Status Partially Met                   Plan - 06/19/21 1104     Clinical Impression Statement Pt continues to respond well to manual therapy, noting that his ankle feels much more mobile and feels as though he can adequately ambulate without the use of hi brace following therapy.  Pt encouraged to utilize good judgement when ambulating without brace as he has not been ambulating without the support for a good while.  Pt agreed and also requeted more manual therapy in future sessions.  Pt does have marked improvements with inversion and eversion following manual therapy, however has not been assessed long term.  Therapist will continue to monitor going forward.  Pt would benefit from continued management of limiting condition by skilled physical therapist to address remaining impairments and functional limitations to work towards stated goals and return to PLOF or maximal functional  independence.    Personal Factors and Comorbidities Age;Comorbidity 3+;Past/Current Experience;Fitness;Time since onset of injury/illness/exacerbation    Comorbidities Relevant past medical history and comorbidities include seizure disorder (controlled with meds), hx R ankle fracture (wears an air brace to prevent pain for years), ACDF (05/2018 for myelopathy), R  Carpal tunnel release (06/2019), BPH, symptoms of peripheral neuropathy at all limbs (fine motor skills in B UE affected), CTS, perfuse degenerative changes on lumbar and cervical spine imaging (see chart for details).    Examination-Activity Limitations Bend;Locomotion Level;Stand;Carry;Sleep;Hygiene/Grooming;Caring for Others;Lift;Stairs;Squat    Examination-Participation Restrictions Shop;Community Activity;Meal Prep;Yard Work    [employer] Evolving/Moderate complexity    Rehab Potential Fair    PT Frequency 2x / week    PT Duration 12 weeks    PT Treatment/Interventions ADLs/Self Care Home Management;Cryotherapy;Traction;Moist Heat;Electrical Stimulation;DME Instruction;Gait training;Stair training;Functional mobility training;Therapeutic activities;Therapeutic exercise;Balance training;Neuromuscular re-education;Patient/family education;Passive range of motion;Dry needling;Manual techniques;Joint Manipulations;Spinal Manipulations    PT Next Visit Plan balance training, LE strength/power, R ankle strengthening as tolerated    PT Home Exercise Plan Medbridge Access Code: [email or web address]    Consulted and Agree with Plan of Care Patient             Patient will benefit from skilled therapeutic intervention in order to improve the following deficits and impairments:  Abnormal gait, Decreased knowledge of use of DME, Impaired sensation, Improper body mechanics, Pain, Decreased coordination, Decreased mobility, Postural dysfunction, Decreased activity tolerance, Decreased endurance, Decreased range of motion, Decreased  strength, Hypomobility, Impaired perceived functional ability, Impaired UE functional use, Decreased balance, Difficulty walking, Impaired flexibility  Visit Diagnosis: Unsteadiness on feet  Chronic bilateral low back pain, unspecified whether sciatica present  Difficulty in walking, not elsewhere classified  History of falling     Problem List Patient Active Problem List   Diagnosis Date  Noted   Hearing loss due to cerumen impaction 03/01/2021   Atypical chest pain 07/10/2020   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   Unsteadiness on feet 11/08/2019   History of spinal surgery 01/17/2019   Neck pain 01/17/2019   Weakness of hand 01/17/2019   Benign prostatic hyperplasia with urinary obstruction 24/26/8341   Systolic murmur 96/22/2979   Right carpal tunnel syndrome 02/02/2018   Ulnar neuropathy at elbow, left 02/02/2018   Peripheral neuropathy 01/26/2018   Bilateral buttock pain 06/29/2016   Failed vision screen 06/29/2016   Advanced care planning/counseling discussion 06/21/2015   Medicare annual wellness visit, subsequent 06/18/2014   Health maintenance examination 06/18/2014   Chronic ankle pain 06/18/2014   Incomplete emptying of bladder 06/07/2013   Vitamin D deficiency 05/20/2013   THYROID NODULE 05/17/2007   HLD (hyperlipidemia) 05/17/2007   TREMOR, ESSENTIAL 05/17/2007   Essential hypertension 05/17/2007   Cervical spondylosis with myelopathy and radiculopathy 05/17/2007   Seizure disorder ([place]) 05/17/2007    Mark Holmes 06/19/2021, 11:10 AM  [facility] [address] [place], [address] Phone: [phone]   Fax:  [phone]  Name: Mark Holmes MRN: [id number] Date of Birth: [birth year]

## 2021-06-25 ENCOUNTER — Ambulatory Visit: Payer: Medicare Other | Attending: Family Medicine

## 2021-06-25 DIAGNOSIS — M545 Low back pain, unspecified: Secondary | ICD-10-CM | POA: Insufficient documentation

## 2021-06-25 DIAGNOSIS — R2681 Unsteadiness on feet: Secondary | ICD-10-CM | POA: Diagnosis not present

## 2021-06-25 DIAGNOSIS — R262 Difficulty in walking, not elsewhere classified: Secondary | ICD-10-CM

## 2021-06-25 DIAGNOSIS — G8929 Other chronic pain: Secondary | ICD-10-CM | POA: Diagnosis present

## 2021-06-25 DIAGNOSIS — Z9181 History of falling: Secondary | ICD-10-CM | POA: Diagnosis present

## 2021-06-25 NOTE — Therapy (Signed)
[facility] [address] [place], [address] Phone: [phone]   Fax:  [phone]  Physical Therapy Treatment  Patient Details  Name: Mark Holmes MRN: [id number] Date of Birth: April 12, 1935 Referring Provider (PT): Allene Dillon, NP   Encounter Date: 06/25/2021   PT End of Session - 06/25/21 1034     Visit Number 27    Number of Visits 44    Date for PT Re-Evaluation 08/21/21    Authorization Type [employer] MEDICARE reporting period from 04/15/2021    Authorization Time Period 03/13/21-06/05/21    Progress Note Due on Visit 30    PT Start Time 0945    PT Stop Time 1031    PT Time Calculation (min) 46 min    Equipment Utilized During Treatment Gait belt    Activity Tolerance Patient tolerated treatment well    Behavior During Therapy [facility] for tasks assessed/performed             Past Medical History:  Diagnosis Date   Benign prostatic hypertrophy with elevated PSA    per Dr. Jacqlyn Larsen   History of CT scan of brain 1992   normal   Hyperglycemia 01/2006   102   Hyperlipemia    Hypertension    Seizure disorder [facility])     Past Surgical History:  Procedure Laterality Date   ANTERIOR CERVICAL DECOMP/DISCECTOMY FUSION  05/2018   for myelopathy -C3/4 ([facility])   CARPAL TUNNEL RELEASE Right 06/2019   ([facility])   CATARACT EXTRACTION W/ INTRAOCULAR LENS IMPLANT Right 2012   [facility]   History of EEG  1985   normal   hospitalization  1981   observation for seizuare   TONSILLECTOMY     TRANSURETHRAL RESECTION OF PROSTATE  10/2005   [place] RESECTION OF PROSTATE  08/2017   (rpt) Cope    There were no vitals filed for this visit.    Shoes/R ankle brace doffed until exiting clinic.   Neuromuscular Re-education: to improve, balance, postural strength, muscle activation patterns, and stabilization strength required for functional activities (CGA - min A as needed for  safety): - standing static balance: eyes open semi-narrow stance on compliant surface, rotating head back and forth horizontally, 2x15 each side.    - tandem stance static balance on airex with U UE support as needed, 60 seconds each side. (not today) - static stance on blue inflated disc, 30 sec bouts with CGA for assist (not today) - side-stepping on airex balance beam with CGA for safety, x10 each direction (pt with increased difficulty and required assistance from pt in order to prevent descent to floor)    Therapeutic exercise: to centralize symptoms and improve ROM, strength, muscular endurance, and activity tolerance required for successful completion of functional activities.  - seated R plantar flexion with black theraband, 2x10 (not today) - seated R ankle inversion against RTB, 2x15 with fist between knees to reduce compensation from hip - seated R ankle eversion against green theraband, 2x15 (not today) - seated R ankle inversion AROM figure 4 position, 1x10 with 10s hold  - seated ankle plantarflexion/dorsiflexion rolls on pro stretch to facilitate mobility of the R ankle. (Not today) - seated B heel raise with inversion with yellow theraband looped around heels, small ball between knees to prevent compensation, 1x20 (not today) - step up to 8.5 inch stool with left foot and down with right foot focusing on R side pushing off  and R foot eccentric lower touching with forefoot first. L UE support. 1x10 (not today) - education provided regarding benefits of not using brace at home with low level activity, but to don brace for high level activity (chores, outside chores, outside ambulation on uneven ground)     Manual Therapy: to increase joint mobility of the ankle to support increased overall ROM and provide relief to ankle musculature. - test/re-test ankle AROM after below interventions - Supine subtalar medial glide in order to increase eversion of the R ankle, Grades III-IV,  multiple bouts - Supine subtalar lateral glide in order to increase inversion of the R ankle, Grades III-IV, multiple bouts - Supine subtalar distraction, 4x30 seconds to increase overall ROM - Supine Talocrual posterior glide, 4x30 seconds to increase overall ROM - Supine R Hamstring stretch, 4x30 seconds with additional nerve flossing and education on how to perform (Not today)   R ANKLE AROM (PRE/POST): DF: 8 // 10 PF: 32 // 32 IV: 12 // 12 EV: 12 // 12      PT Education - 06/25/21 1033     Education Details exercise purpose/form, self-management techniques, ROM indications, decreasing use of ankle brace    Person(s) Educated Patient    Methods Explanation;Demonstration;Tactile cues;Verbal cues    Comprehension Verbalized understanding;Returned demonstration;Verbal cues required;Tactile cues required;Need further instruction              PT Short Term Goals - 04/15/21 1000       PT SHORT TERM GOAL #1   Title Be independent with initial home exercise program for self-management of symptoms.    Baseline Initial HEP to be established at visit 2 as appropriate (03/13/2021); Initial HEP established visit 2 (03/18/2021);    Time 3    Period Weeks    Status Achieved    Target Date 04/03/21               PT Long Term Goals - 05/29/21 1108       PT LONG TERM GOAL #1   Title Be independent with a long-term home exercise program for self-management of symptoms.    Baseline Initial HEP to be established at visit 2 as appropriate (03/13/2021); Initial HEP provided visit 2 (03/18/2021); continues to participate in appropriate HEP (05/29/2021);    Time 12    Period Weeks    Status Partially Met   TARGET DATE FOR ALL LONG TERM GOALS: 06/01/2021, updated to 08/21/2021 for unmet goals   Target Date 08/21/21      PT LONG TERM GOAL #2   Title Demonstrate improved FOTO score by 10 units to demonstrate improvement in overall condition and self-reported functional ability.    Baseline  to be measured vsiti 2 as appropriate (03/13/2021); 51 (03/18/2021); 51 (04/15/21): 54 (05/29/2021);    Time 12    Period Weeks    Status Partially Met      PT LONG TERM GOAL #3   Title Patient will score equal or greater than 25/30 on Functional Gait Assessment to demonstrate reduction in fall risk to low fall risk classification.    Baseline to be measured vsiti 2 as appropriate (03/13/2021); 20/30 moderate fall risk (03/18/2021); 23/30 (05/29/2021);    Time 12    Period Weeks    Status On-going      PT LONG TERM GOAL #4   Title Patient will ambulate equal or greater than 1000 feet during 6 minute walk test to demonstrate improved community mobility.    Baseline to  be measured vsiti 2 as appropriate (03/13/2021); 820 feet with SPC(03/18/2021); 1220f, 4/10 back pain peak that resolves upon rest (04/15/21); 1000 feet with increasing B SIJ pain (05/29/2021);    Time 12    Period Weeks    Status Achieved      PT LONG TERM GOAL #5   Title Complete community, work and/or recreational activities with 50% less limitation due to current condition.    Baseline Functional Limitations: difficulty with activities that require standing balance including outside activities, taking the puppy out, prolonged standing, household chores, community ambulation, grooming, climbing stairs, yard work, sleeping, fall risk, etc. cannot do what he wants to outside because he is afraid to fall (03/13/2021); reports improvements in taking the dog in the front lawn with the leach, wants to be able to walk the dog on trails at the park, has had no falls since he started PT, would like to be able to walk better on uneve ground, has noticed improvements in his function but continues to have some deficits (05/29/2021);    Time 12    Period Weeks    Status Partially Met                   Plan - 06/25/21 1042     Clinical Impression Statement Pt tolerated treatment well today. Ankle DF demonstrates mild improvement pre/post MT  (see above), however, no improvements noted with PF, IV, and EV. Significant compensation from hip IR/ER noted with ankle IV/EV requiring max cueing. However, pt notes improved stiffness. Interventions continue to focus on balance and strengthening to address ankle stability for reduced reliance on ankle brace. Pt educated to not wear ankle brace as much; he verbalized understanding. Pt will continue to benefit from skilled OPPT services to address deficits for improved safety with functional mobility. Will continue per POC.    Personal Factors and Comorbidities Age;Comorbidity 3+;Past/Current Experience;Fitness;Time since onset of injury/illness/exacerbation    Comorbidities Relevant past medical history and comorbidities include seizure disorder (controlled with meds), hx R ankle fracture (wears an air brace to prevent pain for years), ACDF (05/2018 for myelopathy), R  Carpal tunnel release (06/2019), BPH, symptoms of peripheral neuropathy at all limbs (fine motor skills in B UE affected), CTS, perfuse degenerative changes on lumbar and cervical spine imaging (see chart for details).    Examination-Activity Limitations Bend;Locomotion Level;Stand;Carry;Sleep;Hygiene/Grooming;Caring for Others;Lift;Stairs;Squat    Examination-Participation Restrictions Shop;Community Activity;Meal Prep;Yard Work    SMerchant navy officerEvolving/Moderate complexity    Rehab Potential Fair    PT Frequency 2x / week    PT Duration 12 weeks    PT Treatment/Interventions ADLs/Self Care Home Management;Cryotherapy;Traction;Moist Heat;Electrical Stimulation;DME Instruction;Gait training;Stair training;Functional mobility training;Therapeutic activities;Therapeutic exercise;Balance training;Neuromuscular re-education;Patient/family education;Passive range of motion;Dry needling;Manual techniques;Joint Manipulations;Spinal Manipulations    PT Next Visit Plan balance training, LE strength/power, R ankle strengthening as  tolerated    PT Home Exercise Plan Medbridge Access Code: [email or web address]    Consulted and Agree with Plan of Care Patient             Patient will benefit from skilled therapeutic intervention in order to improve the following deficits and impairments:  Abnormal gait, Decreased knowledge of use of DME, Impaired sensation, Improper body mechanics, Pain, Decreased coordination, Decreased mobility, Postural dysfunction, Decreased activity tolerance, Decreased endurance, Decreased range of motion, Decreased strength, Hypomobility, Impaired perceived functional ability, Impaired UE functional use, Decreased balance, Difficulty walking, Impaired flexibility  Visit Diagnosis: Unsteadiness on feet  Chronic bilateral low back pain, unspecified  whether sciatica present  Difficulty in walking, not elsewhere classified  History of falling     Problem List Patient Active Problem List   Diagnosis Date Noted   Hearing loss due to cerumen impaction 03/01/2021   Atypical chest pain 07/10/2020   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   Unsteadiness on feet 11/08/2019   History of spinal surgery 01/17/2019   Neck pain 01/17/2019   Weakness of hand 01/17/2019   Benign prostatic hyperplasia with urinary obstruction 71/69/6789   Systolic murmur 38/09/1750   Right carpal tunnel syndrome 02/02/2018   Ulnar neuropathy at elbow, left 02/02/2018   Peripheral neuropathy 01/26/2018   Bilateral buttock pain 06/29/2016   Failed vision screen 06/29/2016   Advanced care planning/counseling discussion 06/21/2015   Medicare annual wellness visit, subsequent 06/18/2014   Health maintenance examination 06/18/2014   Chronic ankle pain 06/18/2014   Incomplete emptying of bladder 06/07/2013   Vitamin D deficiency 05/20/2013   THYROID NODULE 05/17/2007   HLD (hyperlipidemia) 05/17/2007   TREMOR, ESSENTIAL 05/17/2007   Essential hypertension 05/17/2007   Cervical spondylosis with myelopathy and radiculopathy 05/17/2007    Seizure disorder ([place]) 05/17/2007   Herminio Commons, PT, DPT 10:46 AM,06/25/21   [facility] [address], [place], [address] Phone: [phone]   Fax:  [phone]  Name: ALEM FAHL MRN: [id number] Date of Birth: 06/11/1935

## 2021-06-30 ENCOUNTER — Encounter: Payer: Self-pay | Admitting: Family Medicine

## 2021-06-30 ENCOUNTER — Ambulatory Visit: Payer: Medicare Other | Admitting: Physical Therapy

## 2021-06-30 ENCOUNTER — Other Ambulatory Visit: Payer: Self-pay

## 2021-06-30 ENCOUNTER — Ambulatory Visit: Payer: Medicare Other | Admitting: Family Medicine

## 2021-06-30 ENCOUNTER — Encounter: Payer: Self-pay | Admitting: Physical Therapy

## 2021-06-30 VITALS — BP 110/60 | HR 68 | Temp 98.0°F | Ht 67.0 in | Wt 171.1 lb

## 2021-06-30 DIAGNOSIS — Z9181 History of falling: Secondary | ICD-10-CM

## 2021-06-30 DIAGNOSIS — G6289 Other specified polyneuropathies: Secondary | ICD-10-CM | POA: Diagnosis not present

## 2021-06-30 DIAGNOSIS — M545 Low back pain, unspecified: Secondary | ICD-10-CM

## 2021-06-30 DIAGNOSIS — R2681 Unsteadiness on feet: Secondary | ICD-10-CM

## 2021-06-30 DIAGNOSIS — G40909 Epilepsy, unspecified, not intractable, without status epilepticus: Secondary | ICD-10-CM | POA: Diagnosis not present

## 2021-06-30 DIAGNOSIS — R262 Difficulty in walking, not elsewhere classified: Secondary | ICD-10-CM

## 2021-06-30 DIAGNOSIS — G8929 Other chronic pain: Secondary | ICD-10-CM

## 2021-06-30 MED ORDER — GABAPENTIN 300 MG PO CAPS: 300.0000 mg | ORAL_CAPSULE | Freq: Three times a day (TID) | ORAL | 3 refills | Status: DC

## 2021-06-30 NOTE — Assessment & Plan Note (Addendum)
Saw [facility] - significant benefit with PT balance training and gabapentin 300mg  TID - continue this. 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

## 2021-06-30 NOTE — Therapy (Addendum)
[place] [facility] [address] [place], [address] Phone: [phone]   Fax:  [phone]  Physical Therapy Treatment  Patient Details  Name: Mark Holmes MRN: [id number] Date of Birth: May 09, 1935 Referring Provider (PT): Allene Dillon, NP   Encounter Date: 06/30/2021   PT End of Session - 06/30/21 1834     Visit Number 28    Number of Visits 44    Date for PT Re-Evaluation 08/21/21    Authorization Type [employer] MEDICARE reporting period from 04/15/2021    Progress Note Due on Visit 30    PT Start Time 1435    PT Stop Time 1515    PT Time Calculation (min) 40 min    Equipment Utilized During Treatment Gait belt    Activity Tolerance Patient tolerated treatment well    Behavior During Therapy [facility] for tasks assessed/performed             Past Medical History:  Diagnosis Date   Benign prostatic hypertrophy with elevated PSA    per Dr. Jacqlyn Larsen   History of CT scan of brain 1992   normal   Hyperglycemia 01/2006   102   Hyperlipemia    Hypertension    Seizure disorder [facility])     Past Surgical History:  Procedure Laterality Date   ANTERIOR CERVICAL DECOMP/DISCECTOMY FUSION  05/2018   for myelopathy -C3/4 ([facility])   CARPAL TUNNEL RELEASE Right 06/2019   ([facility])   CATARACT EXTRACTION W/ INTRAOCULAR LENS IMPLANT Right 2012   [facility]   History of EEG  1985   normal   hospitalization  1981   observation for seizuare   TONSILLECTOMY     TRANSURETHRAL RESECTION OF PROSTATE  10/2005   [place] RESECTION OF PROSTATE  08/2017   (rpt) [place]    There were no vitals filed for this visit.   Subjective Assessment - 06/30/21 1440     Subjective Patient reports that he is doing well and is not in any pain today. Pt. had a doctor appointment this morning. At the appointment the patient's physician stated that he wants the patient to continue with PT.   Pt is looking forward  to his neurologic appointment scheduled this Wednesday. Patient reports slight soreness after the last PT session, but believes the new ankle mobilziation work was very helpful.   Patient reports HEP compliance.    Pertinent History Patient is a 86 y.o. male who presents to outpatient physical therapy with a referral for medical diagnosis difficulty balancing, lumbar DDD (degenerative disc disease) with request for balance therapy. This patient's chief complaints consist of discomfort of the B LE below the knee, pain in the low back with standing, and difficulty balancing in the setting of chronic R ankle pain, B hand neuropathy symptoms and history of cervical spine disorder with ACDF at C3-4 for myelopathy leading to the following functional deficits: cannot do what he wants to outside because he is afraid to fall, taking the puppy out, prolonged standing, household chores, community ambulation, grooming, climbing stairs, yard work, sleeping, fall risk, etc. .  Relevant past medical history and comorbidities include seizure disorder (controlled with meds), hx R ankle fracture (wears an air brace to prevent pain for years), ACDF (05/2018 for myelopathy), R  Carpal tunnel release (06/2019), BPH, symptoms of peripheral neuropathy at all limbs (fine motor skills in B UE affected), CTS, perfuse degenerative changes on lumbar and cervical spine imaging (  see chart for details).    Limitations Standing;Walking;Sitting;Other (comment);House hold activities    How long can you sit comfortably? "a long time' (but will feel it when he stands up)    How long can you stand comfortably? can finish the task but gets really uncomfortable in the back    Diagnostic tests Cervical spine MRI report 11/29/2020: " IMPRESSION:  1. Neurofibroma of the left C2 nerve as seen previously, maximal  transverse diameter 11-12 mm.  2. Previous ACDF C3-4. Canal stenosis remains at this level with AP  diameter of 7 mm, without worsening. No  actual cord compression.  3. Spondylosis and facet arthropathy with degenerative  anterolisthesis of C5-6 and C7-T1. Canal narrowing but no cord  compression.  4. Foraminal stenosis primarily due to facet osteophytes and  uncovertebral osteophytes that could cause neural compression  bilateral at C2-3, bilateral at C3-4, bilateral at C4-5, bilateral  but left more than right at C5-6, bilateral but left more than right  at C6-7 and bilateral but left more than right at C7-T1." Lumbar spine MRI report 11/29/2020: "IMPRESSION:  1. Degenerative disc disease and degenerative facet disease  throughout the lumbar region which could be associated with back  pain. Thoracolumbar scoliotic curvature.  2. L2-3: Moderate central canal stenosis. Bilateral lateral recess  and foraminal stenosis left worse than right. Neural compression  could occur at this level, particularly on the left.  3. L3-4: Bilateral lateral recess and foraminal stenosis, right  worse than left. Neural compression could occur on either side,  particularly on the right.  4. L4-5: Bilateral lateral recess and foraminal stenosis, right more  than left. Neural compression could occur on either side,  particularly the right.  5. L5-S1: Bilateral foraminal narrowing with some potential to  affect either exiting L5 nerve."    Patient Stated Goals he would like to walk "like normal."    Currently in Pain? No/denies            TREATMENT:   Shoes/R ankle brace doffed until exiting clinic.   Neuromuscular Re-education: to improve, balance, postural strength, muscle activation patterns, and stabilization strength required for functional activities (CGA - min A as needed for safety): - standing static balance: eyes open semi-narrow stance on compliant surface, rotating head back and forth horizontally, 2x15 each side.   Hurdle activities (6 inch): - forward step and push back to backward step and push back with intermittent UE support. 1x10 each side  (CGA) (pt stands between 2 hurdles, one in front and one behind; steps forward with on leg over hurdle then pushes back to staring position, then steps backwards over back hurdle with same leg and pushes back to starting position). - side step and push back over hurdle, alternating sides, 1x20 each side, intermittent UE support, CGA for safety. (Pt stands between two hurdles one placed on each side of patient, steps over hurdle to one side with one foot, then pushes back to stand in the middle of the hurdles, repeat on other side).    Therapeutic exercise: to centralize symptoms and improve ROM, strength, muscular endurance, and activity tolerance required for successful completion of functional activities.  - step up to 12 inch stool with U UE support and CGA, 2x10 each side focusing on pushing off/eccentric lower with R foot on the floor and strong hip/knee extension from foot on step (especially right).  - seated R plantar flexion with black/silver theraband, 2x20 with knee flexed/extended   - seated R ankle  inversion against RTB, 1x10-15 with fist between knees to reduce compensation from hip - reviewed R ankle eversion using green band - reviewed R ankle inversion AROM in figure 4 position (unable to complete without assistance from UE).  - standing side stepping with red theraband around ankles, with BUE support with attempts to invert R foot to decrease pronation and improve foot stability. (Discussed application at home).   Pt required multimodal cuing for proper technique and to facilitate improved neuromuscular control, strength, range of motion, and functional ability resulting in improved performance and form. (SBA - CGA for standing activities due to risk of occasional giving way of B LE).     HOME EXERCISE PROGRAM Access Code: [email or web address] URL: [email or web address] Date: 06/05/2021 Prepared by: Rosita Kea   Exercises Sit to Stand with Arm Reach and Jump - 2-3 x weekly -  3 sets - 10 reps Half Tandem Stance Balance with Head Rotation - 2-3 x weekly - 1 sets - 20 reps Seated Ankle Plantar Flexion with Resistance Loop - 1 x daily - 3 sets - 10 reps Seated Ankle Eversion with Resistance - 1 x daily - 3 sets - 10 reps      [email or web address] CG [id number] Home Exercise Program [[id number]]   LUNGE with something to hold onto beside you.  -  Repeat 10 Times, Complete 2 Sets, Perform 3 Times a Week   MINI LATERAL LUNGE -  Repeat 20 Times, Complete 1 Set, Perform 3 Times a Week   CUP - TOE TAP AND HOLD -  Repeat 20 Times, Complete 1 Set, Perform 3 Times a Week   JOGGING IN PLACE -  Duration 20 Seconds, Complete 3 Sets, Perform 3 Times a Week   Incline Heel Raises PF -  Repeat 10 Times, Complete 3 Sets, Perform 3 Times a [place] [[id number]]   Post Tib Activation -  Repeat 10 Times, Hold 1 Second(s), Complete 3 Sets, Perform 1 Times a Day    PT Education - 06/30/21 1832     Education Details exercise purpose/form    Person(s) Educated Patient    Methods Explanation;Tactile cues;Verbal cues;Demonstration    Comprehension Verbalized understanding;Returned demonstration;Verbal cues required;Tactile cues required              PT Short Term Goals - 04/15/21 1000       PT SHORT TERM GOAL #1   Title Be independent with initial home exercise program for self-management of symptoms.    Baseline Initial HEP to be established at visit 2 as appropriate (03/13/2021); Initial HEP established visit 2 (03/18/2021);    Time 3    Period Weeks    Status Achieved    Target Date 04/03/21               PT Long Term Goals - 05/29/21 1108       PT LONG TERM GOAL #1   Title Be independent with a long-term home exercise program for self-management of symptoms.    Baseline Initial HEP to be established at visit 2 as appropriate (03/13/2021); Initial HEP provided visit 2 (03/18/2021); continues to participate in appropriate HEP (05/29/2021);    Time 12     Period Weeks    Status Partially Met   TARGET DATE FOR ALL LONG TERM GOALS: 06/01/2021, updated to 08/21/2021 for unmet goals   Target Date 08/21/21      PT LONG TERM GOAL #2   Title Demonstrate improved FOTO score by 10 units  to demonstrate improvement in overall condition and self-reported functional ability.    Baseline to be measured vsiti 2 as appropriate (03/13/2021); 51 (03/18/2021); 51 (04/15/21): 54 (05/29/2021);    Time 12    Period Weeks    Status Partially Met      PT LONG TERM GOAL #3   Title Patient will score equal or greater than 25/30 on Functional Gait Assessment to demonstrate reduction in fall risk to low fall risk classification.    Baseline to be measured vsiti 2 as appropriate (03/13/2021); 20/30 moderate fall risk (03/18/2021); 23/30 (05/29/2021);    Time 12    Period Weeks    Status On-going      PT LONG TERM GOAL #4   Title Patient will ambulate equal or greater than 1000 feet during 6 minute walk test to demonstrate improved community mobility.    Baseline to be measured vsiti 2 as appropriate (03/13/2021); 820 feet with SPC(03/18/2021); 1263f, 4/10 back pain peak that resolves upon rest (04/15/21); 1000 feet with increasing B SIJ pain (05/29/2021);    Time 12    Period Weeks    Status Achieved      PT LONG TERM GOAL #5   Title Complete community, work and/or recreational activities with 50% less limitation due to current condition.    Baseline Functional Limitations: difficulty with activities that require standing balance including outside activities, taking the puppy out, prolonged standing, household chores, community ambulation, grooming, climbing stairs, yard work, sleeping, fall risk, etc. cannot do what he wants to outside because he is afraid to fall (03/13/2021); reports improvements in taking the dog in the front lawn with the leach, wants to be able to walk the dog on trails at the park, has had no falls since he started PT, would like to be able to walk better on  uneve ground, has noticed improvements in his function but continues to have some deficits (05/29/2021);    Time 12    Period Weeks    Status Partially Met                   Plan - 06/30/21 1838     Clinical Impression Statement Patient continues to tolerate treatment well. Session emphasised increasing R ankle strength, strengthening LE, and addressing balance impairments.   Patient demonstrates improving ankle/LE strength with ability to use stiffer band for plantar flexion exercise and tolerating step ups to 12 inch step without knees buckling. Reported his R ankle felt good by end of session. Continued to require cuing to complete dynamic balance activities and UE support with SBA -CGA for safety during standing exercises.   Patient does continue to demonstrate limitations in ankle stability and difficulty with balance which limits patient safety.  Patient will benefit from continued skilled physical therapy to address impairments and improve safety.    Personal Factors and Comorbidities Age;Comorbidity 3+;Past/Current Experience;Fitness;Time since onset of injury/illness/exacerbation    Comorbidities Relevant past medical history and comorbidities include seizure disorder (controlled with meds), hx R ankle fracture (wears an air brace to prevent pain for years), ACDF (05/2018 for myelopathy), R  Carpal tunnel release (06/2019), BPH, symptoms of peripheral neuropathy at all limbs (fine motor skills in B UE affected), CTS, perfuse degenerative changes on lumbar and cervical spine imaging (see chart for details).    Examination-Activity Limitations Bend;Locomotion Level;Stand;Carry;Sleep;Hygiene/Grooming;Caring for Others;Lift;Stairs;Squat    Examination-Participation Restrictions Shop;Community Activity;Meal Prep;Yard Work    SProducer, television/film/video  PT Frequency 2x / week    PT Duration 12 weeks    PT Treatment/Interventions  ADLs/Self Care Home Management;Cryotherapy;Traction;Moist Heat;Electrical Stimulation;DME Instruction;Gait training;Stair training;Functional mobility training;Therapeutic activities;Therapeutic exercise;Balance training;Neuromuscular re-education;Patient/family education;Passive range of motion;Dry needling;Manual techniques;Joint Manipulations;Spinal Manipulations    PT Next Visit Plan balance training, LE strength/power, R ankle strengthening as tolerated    PT Home Exercise Plan Medbridge Access Code: [email or web address]    Consulted and Agree with Plan of Care Patient             Patient will benefit from skilled therapeutic intervention in order to improve the following deficits and impairments:  Abnormal gait, Decreased knowledge of use of DME, Impaired sensation, Improper body mechanics, Pain, Decreased coordination, Decreased mobility, Postural dysfunction, Decreased activity tolerance, Decreased endurance, Decreased range of motion, Decreased strength, Hypomobility, Impaired perceived functional ability, Impaired UE functional use, Decreased balance, Difficulty walking, Impaired flexibility  Visit Diagnosis: Unsteadiness on feet  Chronic bilateral low back pain, unspecified whether sciatica present  Difficulty in walking, not elsewhere classified  History of falling     Problem List Patient Active Problem List   Diagnosis Date Noted   Hearing loss due to cerumen impaction 03/01/2021   Atypical chest pain 07/10/2020   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   Unsteadiness on feet 11/08/2019   History of spinal surgery 01/17/2019   Neck pain 01/17/2019   Weakness of hand 01/17/2019   Benign prostatic hyperplasia with urinary obstruction 34/19/3790   Systolic murmur 24/08/7352   Right carpal tunnel syndrome 02/02/2018   Ulnar neuropathy at elbow, left 02/02/2018   Peripheral neuropathy 01/26/2018   Bilateral buttock pain 06/29/2016   Failed vision screen 06/29/2016   Advanced care  planning/counseling discussion 06/21/2015   Medicare annual wellness visit, subsequent 06/18/2014   Health maintenance examination 06/18/2014   Chronic ankle pain 06/18/2014   Incomplete emptying of bladder 06/07/2013   Vitamin D deficiency 05/20/2013   THYROID NODULE 05/17/2007   HLD (hyperlipidemia) 05/17/2007   TREMOR, ESSENTIAL 05/17/2007   Essential hypertension 05/17/2007   Cervical spondylosis with myelopathy and radiculopathy 05/17/2007   Seizure disorder ([place]) 05/17/2007   Sherryll Burger, SPT 06/30/21, 7:02 PM  Student physical therapist assisted with session under direct supervision of licensed physical therapists during the entirety of the session.   Everlean Alstrom. Graylon Good, PT, DPT 06/30/21, 7:02 PM     [place] [facility] [address], [place], [address] Phone: [phone]   Fax:  [phone]  Name: Mark Holmes MRN: [id number] Date of Birth: 1935/03/05

## 2021-06-30 NOTE — Assessment & Plan Note (Signed)
Receiving significant benefit from physical therapy course. Continue this. Discussed importance of regularly doing HEP.

## 2021-06-30 NOTE — Assessment & Plan Note (Signed)
Longstanding on dilantin.  Upcoming neuro eval.

## 2021-06-30 NOTE — Progress Notes (Signed)
Patient ID: Mark Holmes, male    DOB: 1935-10-04, 86 y.o.   MRN: [id number]  This visit was conducted in person.  BP 110/60 (BP Location: Left Arm, Patient Position: Sitting, Cuff Size: Normal)   Pulse 68   Temp 98 F (36.7 C) (Temporal)   Ht 5\' 7"  (1.702 m)   Wt 171 lb 1 oz (77.6 kg)   SpO2 95%   BMI 26.79 kg/m    CC: 4 mo f/u visit  Subjective:   HPI: Mark Holmes is a 86 y.o. male presenting on 06/30/2021 for Follow-up (4 Month F/U)   Established with PMR [facility]) - latest referred to neurology for further evaluation of peripheral neuropathy. 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 Pending neurology appt with Dr Manuella Ghazi this week.  Undergoing physical therapy at [facility] this past year for unsteadiness - with significant benefit.  Continues gabapentin 300mg  tid.   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  S/p R CTS release surgery 06/21/2019 - with persistent hand paresthesias.      Relevant past medical, surgical, family and social history reviewed and updated as indicated. Interim medical history since our last visit reviewed. Allergies and medications reviewed and updated. Outpatient Medications Prior to Visit  Medication Sig Dispense Refill   aspirin-acetaminophen-caffeine (EXCEDRIN MIGRAINE) 250-250-65 MG per tablet Take 1 tablet by mouth 2 (two) times daily as needed.      atorvastatin (LIPITOR) 20 MG tablet Take 1 tablet (20 mg total) by mouth daily. 90 tablet 3   dutasteride (AVODART) 0.5 MG capsule TAKE 1 CAPSULE BY MOUTH  EVERY OTHER DAY 45 capsule 3   hydrochlorothiazide (MICROZIDE) 12.5 MG capsule Take 1 capsule (12.5 mg total) by mouth daily. 90 capsule 3   naproxen (NAPROSYN) 500 MG tablet Take 1 tablet (500 mg total) by mouth as needed for mild pain or moderate pain (Take 1 tablet twice daily as needed for your pain.). 60  tablet 2   phenytoin (DILANTIN) 100 MG ER capsule TAKE 2 CAPSULES BY MOUTH IN THE MORNING AND 3 CAPSULES  BY MOUTH AT NIGHT 450 capsule 3   Pyridoxine HCl (VITAMIN B-6 PO) Take 100 mg by mouth daily.      ramipril (ALTACE) 10 MG capsule Take 1 capsule (10 mg total) by mouth daily. 90 capsule 3   terazosin (HYTRIN) 10 MG capsule Take 1 capsule (10 mg total) by mouth daily. 90 capsule 3   gabapentin (NEURONTIN) 100 MG capsule Take 1 capsule (100 mg total) by mouth 2 (two) times daily AND 3 capsules (300 mg total) at bedtime. 450 capsule 3   No facility-administered medications prior to visit.     Per HPI unless specifically indicated in ROS section below Review of Systems  Objective:  BP 110/60 (BP Location: Left Arm, Patient Position: Sitting, Cuff Size: Normal)   Pulse 68   Temp 98 F (36.7 C) (Temporal)   Ht 5\' 7"  (1.702 m)   Wt 171 lb 1 oz (77.6 kg)   SpO2 95%   BMI 26.79 kg/m   Wt Readings from Last 3 Encounters:  06/30/21 171 lb 1 oz (77.6 kg)  02/26/21 181 lb 6 oz (82.3 kg)  10/23/20 178 lb 4 oz (80.9 kg)      Physical Exam Vitals and nursing note reviewed.  Constitutional:      Appearance: Normal appearance. He is not ill-appearing.  Comments:  Walks with cane  Musculoskeletal:     Comments: R wrist brace and ankle brace in place  Skin:    General: Skin is warm and dry.     Findings: No rash.  Neurological:     Mental Status: He is alert.  Psychiatric:        Mood and Affect: Mood normal.        Behavior: Behavior normal.       Assessment & Plan:  This visit occurred during the SARS-CoV-2 public health emergency.  Safety protocols were in place, including screening questions prior to the visit, additional usage of staff PPE, and extensive cleaning of exam room while observing appropriate contact time as indicated for disinfecting solutions.   Problem List Items Addressed This Visit     Seizure disorder ([place])    Longstanding on dilantin.  Upcoming neuro  eval.        Relevant Medications   gabapentin (NEURONTIN) 300 MG capsule   Peripheral neuropathy    Saw [facility] - significant benefit with PT balance training and gabapentin 300mg  TID - continue this. BLE NCS consistent with generalized sensory predominant neuropathy - referred to [facility] neurology Appreciate neuro care.        Relevant Medications   gabapentin (NEURONTIN) 300 MG capsule   Unsteadiness on feet - Primary    Receiving significant benefit from physical therapy course. Continue this. Discussed importance of regularly doing HEP.          Meds ordered this encounter  Medications   gabapentin (NEURONTIN) 300 MG capsule    Sig: Take 1 capsule (300 mg total) by mouth in the morning, at noon, and at bedtime.    Dispense:  90 capsule    Refill:  3    Note new sig    No orders of the defined types were placed in this encounter.   Patient Instructions  Keep follow up appointment already scheduled for a physical in November.  I'm glad physical therapy and gabapentin are helping.  Continue seeing PT as able. Continue working on home exercise program.   Follow up plan: Return if symptoms worsen or fail to improve.  Ria Bush, MD

## 2021-06-30 NOTE — Patient Instructions (Signed)
Keep follow up appointment already scheduled for a physical in November.  I'm glad physical therapy and gabapentin are helping.  Continue seeing PT as able. Continue working on home exercise program.

## 2021-07-02 ENCOUNTER — Encounter: Payer: Medicare Other | Admitting: Physical Therapy

## 2021-07-07 ENCOUNTER — Ambulatory Visit: Payer: Medicare Other | Admitting: Physical Therapy

## 2021-07-07 ENCOUNTER — Encounter: Payer: Self-pay | Admitting: Physical Therapy

## 2021-07-07 DIAGNOSIS — R262 Difficulty in walking, not elsewhere classified: Secondary | ICD-10-CM

## 2021-07-07 DIAGNOSIS — Z9181 History of falling: Secondary | ICD-10-CM

## 2021-07-07 DIAGNOSIS — G8929 Other chronic pain: Secondary | ICD-10-CM

## 2021-07-07 DIAGNOSIS — M545 Low back pain, unspecified: Secondary | ICD-10-CM

## 2021-07-07 DIAGNOSIS — R2681 Unsteadiness on feet: Secondary | ICD-10-CM

## 2021-07-07 NOTE — Therapy (Signed)
[facility] [address] [place], [address] Phone: [phone]   Fax:  [phone]  Physical Therapy Treatment  Patient Details  Name: Mark Holmes MRN: [id number] Date of Birth: [birth year] Referring Provider (PT): Allene Dillon, NP   Encounter Date: 07/07/2021   PT End of Session - 07/07/21 1842     Visit Number 29    Number of Visits 44    Date for PT Re-Evaluation 08/21/21    Authorization Type [employer] MEDICARE reporting period from 04/15/2021    Progress Note Due on Visit 30    PT Start Time 1735    PT Stop Time 1822    PT Time Calculation (min) 47 min    Equipment Utilized During Treatment Gait belt    Activity Tolerance Patient tolerated treatment well    Behavior During Therapy [facility] for tasks assessed/performed             Past Medical History:  Diagnosis Date   Benign prostatic hypertrophy with elevated PSA    per Dr. Jacqlyn Larsen   History of CT scan of brain 1992   normal   Hyperglycemia 01/2006   102   Hyperlipemia    Hypertension    Seizure disorder [facility])     Past Surgical History:  Procedure Laterality Date   ANTERIOR CERVICAL DECOMP/DISCECTOMY FUSION  05/2018   for myelopathy -C3/4 ([facility])   CARPAL TUNNEL RELEASE Right 06/2019   ([facility])   CATARACT EXTRACTION W/ INTRAOCULAR LENS IMPLANT Right 2012   Spray eye   History of EEG  1985   normal   hospitalization  1981   observation for seizuare   TONSILLECTOMY     TRANSURETHRAL RESECTION OF PROSTATE  10/2005   [place] RESECTION OF PROSTATE  08/2017   (rpt) [facility]    There were no vitals filed for this visit.   Subjective Assessment - 07/07/21 1832     Subjective Patient went ot neurologist this week. Patient reportst that the neurologist appointment was dissapointing. He did not get to talk to the doctor as long as he would have liked and states that he did not have the opportunity to ask  them all of his questions, including questions about his medication. He states that the doctor told him that "the nerves are damaged and there is nothing we can do about it."  The patient believes that PT is the only thing left that will help him. Patient believes that physical therapy has been beneficial and noted feeling more stable while walking his dog and getting dressed for the day. He reports a change in his medication schedule. He states that he is now taking one gabapentin in the morning, one in the middle of the day, and one in the evening..    Pertinent History Patient is a 86 y.o. male who presents to outpatient physical therapy with a referral for medical diagnosis difficulty balancing, lumbar DDD (degenerative disc disease) with request for balance therapy. This patient's chief complaints consist of discomfort of the B LE below the knee, pain in the low back with standing, and difficulty balancing in the setting of chronic R ankle pain, B hand neuropathy symptoms and history of cervical spine disorder with ACDF at C3-4 for myelopathy leading to the following functional deficits: cannot do what he wants to outside because he is afraid to fall, taking the puppy out, prolonged standing, household chores, community ambulation, grooming, climbing stairs, yard  work, sleeping, fall risk, etc. .  Relevant past medical history and comorbidities include seizure disorder (controlled with meds), hx R ankle fracture (wears an air brace to prevent pain for years), ACDF (05/2018 for myelopathy), R  Carpal tunnel release (06/2019), BPH, symptoms of peripheral neuropathy at all limbs (fine motor skills in B UE affected), CTS, perfuse degenerative changes on lumbar and cervical spine imaging (see chart for details).    Limitations Standing;Walking;Sitting;Other (comment);House hold activities    How long can you sit comfortably? "a long time' (but will feel it when he stands up)    How long can you stand comfortably?  can finish the task but gets really uncomfortable in the back    Diagnostic tests Cervical spine MRI report 11/29/2020: " IMPRESSION:  1. Neurofibroma of the left C2 nerve as seen previously, maximal  transverse diameter 11-12 mm.  2. Previous ACDF C3-4. Canal stenosis remains at this level with AP  diameter of 7 mm, without worsening. No actual cord compression.  3. Spondylosis and facet arthropathy with degenerative  anterolisthesis of C5-6 and C7-T1. Canal narrowing but no cord  compression.  4. Foraminal stenosis primarily due to facet osteophytes and  uncovertebral osteophytes that could cause neural compression  bilateral at C2-3, bilateral at C3-4, bilateral at C4-5, bilateral  but left more than right at C5-6, bilateral but left more than right  at C6-7 and bilateral but left more than right at C7-T1." Lumbar spine MRI report 11/29/2020: "IMPRESSION:  1. Degenerative disc disease and degenerative facet disease  throughout the lumbar region which could be associated with back  pain. Thoracolumbar scoliotic curvature.  2. L2-3: Moderate central canal stenosis. Bilateral lateral recess  and foraminal stenosis left worse than right. Neural compression  could occur at this level, particularly on the left.  3. L3-4: Bilateral lateral recess and foraminal stenosis, right  worse than left. Neural compression could occur on either side,  particularly on the right.  4. L4-5: Bilateral lateral recess and foraminal stenosis, right more  than left. Neural compression could occur on either side,  particularly the right.  5. L5-S1: Bilateral foraminal narrowing with some potential to  affect either exiting L5 nerve."    Patient Stated Goals he would like to walk "like normal."            TREATMENT:  Shoes/R ankle brace doffed until exiting clinic.   Neuromuscular Re-education: to improve, balance, postural strength, muscle activation patterns, and stabilization strength required for functional activities (CGA -  min A as needed for safety): - standing static balance: eyes open semi-narrow stance on compliant surface, rotating head back and forth horizontally, 2x15 each side.    Hurdle activities (6 inch): - forward step and push back to backward step and push back with intermittent UE support. 1x10 each side (CGA) (pt stands between 2 hurdles, one in front and one behind; steps forward with on leg over hurdle then pushes back to staring position, then steps backwards over back hurdle with same leg and pushes back to starting position). - side step and push back over hurdle, alternating sides, 1x20 each side, intermittent UE support, CGA for safety. (Pt stands between two hurdles one placed on each side of patient, steps over hurdle to one side with one foot, then pushes back to stand in the middle of the hurdles, repeat on other side).  - education on role of PT with peripheral neuropathy   Therapeutic exercise: to centralize symptoms and improve ROM, strength,  muscular endurance, and activity tolerance required for successful completion of functional activities.  - step up to 12 inch stool with U UE support and CGA, 2x10 each side focusing on pushing off/eccentric lower with R foot on the floor and strong hip/knee extension from foot on step (especially right).    Pt required multimodal cuing for proper technique and to facilitate improved neuromuscular control, strength, range of motion, and functional ability resulting in improved performance and form. (SBA - CGA for standing activities due to risk of occasional giving way of B LE).   Manual Therapy: to increase joint mobility of the ankle to support increased overall ROM and provide relief to ankle musculature. SUPINE POSITION: R ankle - Supine subtalar medial glide in order to increase eversion of the R ankle, Grades III-IV, multiple bouts - Supine subtalar lateral glide in order to increase inversion of the R ankle, Grades III-IV, multiple bouts -  Supine subtalar distraction to R ankle, 4x30 seconds to increase overall ROM - Supine Talocrual posterior glide, 4x30 seconds to increase overall ROM - supine talocrual distraction, 1x30 seconds      PT Education - 07/07/21 1841     Education Details exercise purpose/form. Recieved education on the benefits and limitations of PT for the treatment of peripheral neuropathy.    Person(s) Educated Patient    Methods Explanation;Demonstration;Tactile cues;Verbal cues    Comprehension Returned demonstration;Verbalized understanding;Verbal cues required;Tactile cues required              PT Short Term Goals - 04/15/21 1000       PT SHORT TERM GOAL #1   Title Be independent with initial home exercise program for self-management of symptoms.    Baseline Initial HEP to be established at visit 2 as appropriate (03/13/2021); Initial HEP established visit 2 (03/18/2021);    Time 3    Period Weeks    Status Achieved    Target Date 04/03/21               PT Long Term Goals - 05/29/21 1108       PT LONG TERM GOAL #1   Title Be independent with a long-term home exercise program for self-management of symptoms.    Baseline Initial HEP to be established at visit 2 as appropriate (03/13/2021); Initial HEP provided visit 2 (03/18/2021); continues to participate in appropriate HEP (05/29/2021);    Time 12    Period Weeks    Status Partially Met   TARGET DATE FOR ALL LONG TERM GOALS: 06/01/2021, updated to 08/21/2021 for unmet goals   Target Date 08/21/21      PT LONG TERM GOAL #2   Title Demonstrate improved FOTO score by 10 units to demonstrate improvement in overall condition and self-reported functional ability.    Baseline to be measured vsiti 2 as appropriate (03/13/2021); 51 (03/18/2021); 51 (04/15/21): 54 (05/29/2021);    Time 12    Period Weeks    Status Partially Met      PT LONG TERM GOAL #3   Title Patient will score equal or greater than 25/30 on Functional Gait Assessment to  demonstrate reduction in fall risk to low fall risk classification.    Baseline to be measured vsiti 2 as appropriate (03/13/2021); 20/30 moderate fall risk (03/18/2021); 23/30 (05/29/2021);    Time 12    Period Weeks    Status On-going      PT LONG TERM GOAL #4   Title Patient will ambulate equal or greater than 1000  feet during 6 minute walk test to demonstrate improved community mobility.    Baseline to be measured vsiti 2 as appropriate (03/13/2021); 820 feet with SPC(03/18/2021); 125f, 4/10 back pain peak that resolves upon rest (04/15/21); 1000 feet with increasing B SIJ pain (05/29/2021);    Time 12    Period Weeks    Status Achieved      PT LONG TERM GOAL #5   Title Complete community, work and/or recreational activities with 50% less limitation due to current condition.    Baseline Functional Limitations: difficulty with activities that require standing balance including outside activities, taking the puppy out, prolonged standing, household chores, community ambulation, grooming, climbing stairs, yard work, sleeping, fall risk, etc. cannot do what he wants to outside because he is afraid to fall (03/13/2021); reports improvements in taking the dog in the front lawn with the leach, wants to be able to walk the dog on trails at the park, has had no falls since he started PT, would like to be able to walk better on uneve ground, has noticed improvements in his function but continues to have some deficits (05/29/2021);    Time 12    Period Weeks    Status Partially Met                   Plan - 07/07/21 1900     Clinical Impression Statement Patient tolerated treatment well .Neurologist confirmed peripheral neuropathy at recent visit, which is a likely a significant contributor to his balance, sensory, and functional mobility impairments. Additionally, patient continues to demonstrate R ankle weakness and instability which limits their ability to weight bear and push off with the R LE.  Ankle impairments also limit patient balance and functional mobility in addition to increase the patient's fall risk. Patient reports improvements in ankle joint motion. Patient continues to benefit from skilled physical therapy to address impairments, improve patient safety, and maximize functional mobility.    Personal Factors and Comorbidities Age;Comorbidity 3+;Past/Current Experience;Fitness;Time since onset of injury/illness/exacerbation    Comorbidities Relevant past medical history and comorbidities include seizure disorder (controlled with meds), hx R ankle fracture (wears an air brace to prevent pain for years), ACDF (05/2018 for myelopathy), R  Carpal tunnel release (06/2019), BPH, symptoms of peripheral neuropathy at all limbs (fine motor skills in B UE affected), CTS, perfuse degenerative changes on lumbar and cervical spine imaging (see chart for details).    Examination-Activity Limitations Bend;Locomotion Level;Stand;Carry;Sleep;Hygiene/Grooming;Caring for Others;Lift;Stairs;Squat    Examination-Participation Restrictions Shop;Community Activity;Meal Prep;Yard Work    SMerchant navy officerEvolving/Moderate complexity    Rehab Potential Fair    PT Frequency 2x / week    PT Duration 12 weeks    PT Treatment/Interventions ADLs/Self Care Home Management;Cryotherapy;Traction;Moist Heat;Electrical Stimulation;DME Instruction;Gait training;Stair training;Functional mobility training;Therapeutic activities;Therapeutic exercise;Balance training;Neuromuscular re-education;Patient/family education;Passive range of motion;Dry needling;Manual techniques;Joint Manipulations;Spinal Manipulations    PT Next Visit Plan balance training, LE strength/power, R ankle strengthening as tolerated    PT Home Exercise Plan Medbridge Access Code: [email or web address]    Consulted and Agree with Plan of Care Patient             Patient will benefit from skilled therapeutic intervention in order to improve  the following deficits and impairments:  Abnormal gait, Decreased knowledge of use of DME, Impaired sensation, Improper body mechanics, Pain, Decreased coordination, Decreased mobility, Postural dysfunction, Decreased activity tolerance, Decreased endurance, Decreased range of motion, Decreased strength, Hypomobility, Impaired perceived functional ability, Impaired UE functional use, Decreased balance, Difficulty  walking, Impaired flexibility  Visit Diagnosis: Chronic bilateral low back pain, unspecified whether sciatica present  Unsteadiness on feet  Difficulty in walking, not elsewhere classified  History of falling     Problem List Patient Active Problem List   Diagnosis Date Noted   Hearing loss due to cerumen impaction 03/01/2021   Atypical chest pain 07/10/2020   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   Unsteadiness on feet 11/08/2019   History of spinal surgery 01/17/2019   Neck pain 01/17/2019   Weakness of hand 01/17/2019   Benign prostatic hyperplasia with urinary obstruction 64/68/0321   Systolic murmur 22/48/2500   Right carpal tunnel syndrome 02/02/2018   Ulnar neuropathy at elbow, left 02/02/2018   Peripheral neuropathy 01/26/2018   Bilateral buttock pain 06/29/2016   Failed vision screen 06/29/2016   Advanced care planning/counseling discussion 06/21/2015   Medicare annual wellness visit, subsequent 06/18/2014   Health maintenance examination 06/18/2014   Chronic ankle pain 06/18/2014   Incomplete emptying of bladder 06/07/2013   Vitamin D deficiency 05/20/2013   THYROID NODULE 05/17/2007   HLD (hyperlipidemia) 05/17/2007   TREMOR, ESSENTIAL 05/17/2007   Essential hypertension 05/17/2007   Cervical spondylosis with myelopathy and radiculopathy 05/17/2007   Seizure disorder ([place]) 05/17/2007   Sherryll Burger, SPT  Student physical therapist under direct supervision of licensed physical therapists during the entirety of the session.   Everlean Alstrom. Graylon Good, PT,  DPT 07/07/21, 7:09 PM   [facility] [address] [place], [address] Phone: [phone]   Fax:  [phone]  Name: Mark Holmes MRN: [id number] Date of Birth: 1935/05/20

## 2021-07-08 ENCOUNTER — Encounter: Payer: Medicare Other | Admitting: Physical Therapy

## 2021-07-09 ENCOUNTER — Ambulatory Visit: Payer: Medicare Other | Admitting: Physical Therapy

## 2021-07-09 ENCOUNTER — Encounter: Payer: Self-pay | Admitting: Physical Therapy

## 2021-07-09 DIAGNOSIS — R2681 Unsteadiness on feet: Secondary | ICD-10-CM

## 2021-07-09 DIAGNOSIS — Z9181 History of falling: Secondary | ICD-10-CM

## 2021-07-09 DIAGNOSIS — R262 Difficulty in walking, not elsewhere classified: Secondary | ICD-10-CM

## 2021-07-09 DIAGNOSIS — M545 Low back pain, unspecified: Secondary | ICD-10-CM

## 2021-07-09 DIAGNOSIS — G8929 Other chronic pain: Secondary | ICD-10-CM

## 2021-07-09 NOTE — Therapy (Addendum)
[facility] [address] [place], [address] Phone: [phone]   Fax:  [phone]  Physical Therapy Treatment/Progress Note Dates of reporting: 05/29/2021 - 07/09/2021  Patient Details  Name: Mark Holmes MRN: [id number] Date of Birth: 06-08-1935 Referring Provider (PT): Allene Dillon, NP   Encounter Date: 07/09/2021   PT End of Session - 07/09/21 1141     Visit Number 30    Number of Visits 44    Date for PT Re-Evaluation 08/21/21    Authorization Type [employer] MEDICARE reporting period from 07/09/2021    Progress Note Due on Visit 16    PT Start Time 0948    PT Stop Time 1028    PT Time Calculation (min) 40 min    Equipment Utilized During Treatment Gait belt    Activity Tolerance Patient tolerated treatment well    Behavior During Therapy [facility] for tasks assessed/performed             Past Medical History:  Diagnosis Date   Benign prostatic hypertrophy with elevated PSA    per Dr. Jacqlyn Larsen   History of CT scan of brain 1992   normal   Hyperglycemia 01/2006   102   Hyperlipemia    Hypertension    Seizure disorder [facility])     Past Surgical History:  Procedure Laterality Date   ANTERIOR CERVICAL DECOMP/DISCECTOMY FUSION  05/2018   for myelopathy -C3/4 ([facility])   CARPAL TUNNEL RELEASE Right 06/2019   ([facility])   CATARACT EXTRACTION W/ INTRAOCULAR LENS IMPLANT Right 2012   [facility]   History of EEG  1985   normal   hospitalization  1981   observation for seizuare   TONSILLECTOMY     TRANSURETHRAL RESECTION OF PROSTATE  10/2005   [place] RESECTION OF PROSTATE  08/2017   (rpt) [place]    There were no vitals filed for this visit.   Subjective Assessment - 07/09/21 1101     Subjective Patient is doing well today. Patients believe that PT has been helpful and the exercises are beneficial. He state that they look forward to it every week. Patient also states  that his mental outlook has improved with PT. Patient would like to continue to work on LE strength. He reports that his legs feel heavy when walking and going up stairs. Patient reports goal of walking dogs on outside trail. During functional tests patient reports that tireness in his LE prevents him from walking further and faster. Patient consistently reports that his right ankle limits his funciton and he feels he would have significantly less funcitonal limitation and imbalance if his right ankle was stronger and less stiff. He does report his right ankle "gives out" on him still.    Pertinent History Patient is a 86 y.o. male who presents to outpatient physical therapy with a referral for medical diagnosis difficulty balancing, lumbar DDD (degenerative disc disease) with request for balance therapy. This patient's chief complaints consist of discomfort of the B LE below the knee, pain in the low back with standing, and difficulty balancing in the setting of chronic R ankle pain, B hand neuropathy symptoms and history of cervical spine disorder with ACDF at C3-4 for myelopathy leading to the following functional deficits: cannot do what he wants to outside because he is afraid to fall, taking the puppy out, prolonged standing, household chores, community ambulation, grooming, climbing stairs, yard work, sleeping, fall risk, etc. .  Relevant past medical history and comorbidities include seizure disorder (controlled with meds), hx R ankle fracture (wears an air brace to prevent pain for years), ACDF (05/2018 for myelopathy), R  Carpal tunnel release (06/2019), BPH, symptoms of peripheral neuropathy at all limbs (fine motor skills in B UE affected), CTS, perfuse degenerative changes on lumbar and cervical spine imaging (see chart for details).    Limitations Standing;Walking;Sitting;Other (comment);House hold activities    How long can you sit comfortably? "a long time' (but will feel it when he stands up)     How long can you stand comfortably? can finish the task but gets really uncomfortable in the back    Diagnostic tests Cervical spine MRI report 11/29/2020: " IMPRESSION:  1. Neurofibroma of the left C2 nerve as seen previously, maximal  transverse diameter 11-12 mm.  2. Previous ACDF C3-4. Canal stenosis remains at this level with AP  diameter of 7 mm, without worsening. No actual cord compression.  3. Spondylosis and facet arthropathy with degenerative  anterolisthesis of C5-6 and C7-T1. Canal narrowing but no cord  compression.  4. Foraminal stenosis primarily due to facet osteophytes and  uncovertebral osteophytes that could cause neural compression  bilateral at C2-3, bilateral at C3-4, bilateral at C4-5, bilateral  but left more than right at C5-6, bilateral but left more than right  at C6-7 and bilateral but left more than right at C7-T1." Lumbar spine MRI report 11/29/2020: "IMPRESSION:  1. Degenerative disc disease and degenerative facet disease  throughout the lumbar region which could be associated with back  pain. Thoracolumbar scoliotic curvature.  2. L2-3: Moderate central canal stenosis. Bilateral lateral recess  and foraminal stenosis left worse than right. Neural compression  could occur at this level, particularly on the left.  3. L3-4: Bilateral lateral recess and foraminal stenosis, right  worse than left. Neural compression could occur on either side,  particularly on the right.  4. L4-5: Bilateral lateral recess and foraminal stenosis, right more  than left. Neural compression could occur on either side,  particularly the right.  5. L5-S1: Bilateral foraminal narrowing with some potential to  affect either exiting L5 nerve."    Patient Stated Goals he would like to walk "like normal."    Currently in Pain? No/denies    Effect of Pain on Daily Activities Functional Limitations: difficulty with activities that require standing balance including outside activities, taking the puppy out,  prolonged standing, household chores, community ambulation, grooming, climbing stairs, yard work, sleeping, fall risk, etc. He states he cannot do what he wants to outside because he is afraid to fall. He can do what he wants down on his knees, but not standing up. He has a new puppy and it is hard to keep his balance when she is pulling him (03/13/2021); states the dog doesn't pull him off balance any more and he was able to trim trees with the extended trimmer, back pain in the morning is better (05/08/2021); Patient reports difficulty with prolonged walking and going upstairs. States that his legs fatigue limits his ability to performing these tasks. Patient reports goal of walking dogs on outside uneven trails (07/09/2021)               [facility] PT Assessment - 07/09/21 1009       Assessment   Medical Diagnosis difficulty balancing, lumbar DDD (degenerative disc disease), request for balance therapy    Referring Provider (PT) Allene Dillon, NP    Prior Therapy reports a few  visits in the past for low back pain      Precautions   Precautions None      Restrictions   Weight Bearing Restrictions No      Prior Function   Level of Independence Independent   used to do yard work, work on his home, Social research officer, government.   Vocation Retired    [email or web address] hobby, puppy, yard work      Charity fundraiser Status Within Functional Limits for tasks assessed      Observation/Other Assessments   Focus on Therapeutic Outcomes (FOTO)  58      Functional Gait  Assessment   Gait assessed  --   No AD. Shoes and R air brace donned   Gait Level Surface Walks 20 ft in less than 5.5 sec, no assistive devices, good speed, no evidence for imbalance, normal gait pattern, deviates no more than 6 in outside of the 12 in walkway width.   4.13  seconds   Change in Gait Speed Able to change speed, demonstrates mild gait deviations, deviates 6-10 in outside of the 12 in walkway width, or no gait deviations,  unable to achieve a major change in velocity, or uses a change in velocity, or uses an assistive device.    Gait with Horizontal Head Turns Performs head turns smoothly with slight change in gait velocity (eg, minor disruption to smooth gait path), deviates 6-10 in outside 12 in walkway width, or uses an assistive device.    Gait with Vertical Head Turns Performs head turns with no change in gait. Deviates no more than 6 in outside 12 in walkway width.    Gait and Pivot Turn Pivot turns safely within 3 sec and stops quickly with no loss of balance.    Step Over Obstacle Is able to step over 2 stacked shoe boxes taped together (9 in total height) without changing gait speed. No evidence of imbalance.    Gait with Narrow Base of Support Ambulates less than 4 steps heel to toe or cannot perform without assistance.    Gait with Eyes Closed Walks 20 ft, slow speed, abnormal gait pattern, evidence for imbalance, deviates 10-15 in outside 12 in walkway width. Requires more than 9 sec to ambulate 20 ft.    Ambulating Backwards Walks 20 ft, uses assistive device, slower speed, mild gait deviations, deviates 6-10 in outside 12 in walkway width.    Steps Alternating feet, no rail.    Total Score 22    FGA comment: 19-24 = medium risk fall   Performed with shoes and R ankle brace            OBJECTIVE  FOTO =  58  (07/09/2021)  FUNCTIONAL TESTS 6 Minute Walk Test: 1142f c progressive pain near the B low back  6/10 and 4/10 pain in R ankle. Slightly stooped with progressive R trunk lean. Decreased push off with R ankle while ambulating. Wide stance. Did not use AD, but did wear ankle brace.  FGA = 22/30 (see above). Noted for altered gait pattern at times with gingernes or difficulty when weight bearing through R ankle.   TREATMENT:     Therapeutic exercise: to centralize symptoms and improve ROM, strength, muscular endurance, and activity tolerance required for successful completion of functional  activities.  - ambulation around clinic for time in 6 minutes, no AD  (See above). - Education on diagnosis, prognosis, POC, anatomy and physiology of current condition.    Neuromuscular Re-education: to  improve, balance, postural strength, muscle activation patterns, and stabilization strength required for functional activities (CGA - supervision  as needed for safety): - functional gait assessment (see above)    Pt required multimodal cuing for proper technique and to facilitate improved neuromuscular control, strength, range of motion, and functional ability resulting in improved performance and form.    PT Education - 07/09/21 1119     Education Details assessment purpose/form. POC, progress    Person(s) Educated Patient    Methods Explanation;Demonstration;Tactile cues;Verbal cues    Comprehension Verbalized understanding;Returned demonstration;Tactile cues required;Verbal cues required               PT Short Term Goals - 04/15/21 1000       PT SHORT TERM GOAL #1   Title Be independent with initial home exercise program for self-management of symptoms.    Baseline Initial HEP to be established at visit 2 as appropriate (03/13/2021); Initial HEP established visit 2 (03/18/2021);    Time 3    Period Weeks    Status Achieved    Target Date 04/03/21               PT Long Term Goals - 07/09/21 1158       PT LONG TERM GOAL #1   Title Be independent with a long-term home exercise program for self-management of symptoms.    Baseline Initial HEP to be established at visit 2 as appropriate (03/13/2021); Initial HEP provided visit 2 (03/18/2021); continues to participate in appropriate HEP (05/29/2021); continues to participate in HEP (07/09/2021);    Time 12    Period Weeks    Status Partially Met   TARGET DATE FOR ALL LONG TERM GOALS: 06/01/2021, updated to 08/21/2021 for unmet goals     PT LONG TERM GOAL #2   Title Demonstrate improved FOTO score by 10 units to demonstrate  improvement in overall condition and self-reported functional ability.    Baseline to be measured visit 2 as appropriate (03/13/2021); 51 (03/18/2021); 51 (04/15/21): 54 (05/29/2021); 58 (07/09/2021);    Time 12    Period Weeks    Status Partially Met      PT LONG TERM GOAL #3   Title Patient will score equal or greater than 25/30 on Functional Gait Assessment to demonstrate reduction in fall risk to low fall risk classification.    Baseline to be measured vsiti 2 as appropriate (03/13/2021); 20/30 moderate fall risk (03/18/2021); 23/30 (05/29/2021); 22/30 (07/09/2021):    Time 12    Period Weeks    Status On-going      PT LONG TERM GOAL #4   Title Patient will ambulate equal or greater than 1000 feet during 6 minute walk test to demonstrate improved community mobility.    Baseline to be measured vsiti 2 as appropriate (03/13/2021); 820 feet with SPC(03/18/2021); 1213f, 4/10 back pain peak that resolves upon rest (04/15/21); 1000 feet with increasing B SIJ pain (05/29/2021); 1100 feet with increasing B low back and R ankle pain (07/09/2021);    Time 12    Period Weeks    Status Achieved      PT LONG TERM GOAL #5   Title Complete community, work and/or recreational activities with 50% less limitation due to current condition.    Baseline Functional Limitations: difficulty with activities that require standing balance including outside activities, taking the puppy out, prolonged standing, household chores, community ambulation, grooming, climbing stairs, yard work, sleeping, fall risk, etc. He states he cannot do what he  wants to outside because he is afraid to fall. He can do what he wants down on his knees, but not standing up. He has a new puppy and it is hard to keep his balance when she is pulling him (03/13/2021); states the dog doesn't pull him off balance any more and he was able to trim trees with the extended trimmer, back pain in the morning is better (05/08/2021); Patient reports difficulty with  prolonged walking and going upstairs. States that his legs fatigue limits his ability to performing these tasks. Patient reports goal of walking dogs on outside uneven trails (07/09/2021);    Time 12    Period Weeks    Status Partially Met                   Plan - 07/09/21 1235     Clinical Impression Statement Patient has attended 30 physical therapy sessions this episode of care and overall is making progress towards goals. Significant improvement in FOTO score from 54 (05/29/2021) to 58 (07/09/2021) indicate improvement in overall condition and patient-reported functional ability. Patient ambulated 1100 feet during 6 minute walk test as compaired to 1000 feet at last assessment. However, B low back pain, fatigue and decreased LE strength continue to limit patients functional mobility and ability to stand, walk. Additionally, R ankle pain and decreased strength limit patient ability to push off while ambulating and limit patient stability with dynamic balance. Patient still has difficulty with dynamic balance tasks requiring CGA and is at moderate risk for falls as evidence by FGA score (22/30).   Pateint continues to present with signs/symptoms consistent with peripheral neuropathy that is contributing to his funcitonal limitations. His right ankle weakness/stiffness related to prior fracture and history of meyelopathy also contribut to limitations. Patient demonstrates potential to further improve his functional mobility and balance with continued PT incorporating interventions for R ankle, generalized strengthening and balance.  Patient continues to present with significant pain, disturbance of sensation, motor control, balance, gait, ROM, muscle tension, joint stiffness, muscle performance (strength/power/endurance) and activity tolerance impairments that are limiting ability to complete his usual activities including activities that require standing balance including outside activities, walking  his dog, walking on uneven surfaces, prolonged standing, household chores, community ambulation, grooming, climbing stairs, yard work, sleeping, fall risk, etc, without difficulty. He cannot do what he wants to outside including walking his dog on uneven surfaces due to his deficits. Patient would benefit from continued management of limiting condition by skilled physical therapist to address remaining impairments and functional limitations to work towards stated goals and return to PLOF or maximal functional independence.    Personal Factors and Comorbidities Age;Comorbidity 3+;Past/Current Experience;Fitness;Time since onset of injury/illness/exacerbation    Comorbidities Relevant past medical history and comorbidities include seizure disorder (controlled with meds), hx R ankle fracture (wears an air brace to prevent pain for years), ACDF (05/2018 for myelopathy), R  Carpal tunnel release (06/2019), BPH, symptoms of peripheral neuropathy at all limbs (fine motor skills in B UE affected), CTS, perfuse degenerative changes on lumbar and cervical spine imaging (see chart for details).    Examination-Activity Limitations Bend;Locomotion Level;Stand;Carry;Sleep;Hygiene/Grooming;Caring for Others;Lift;Stairs;Squat    Examination-Participation Restrictions Shop;Community Activity;Meal Prep;Yard Work    Merchant navy officer Evolving/Moderate complexity    Rehab Potential Fair    PT Frequency 2x / week    PT Duration 12 weeks    PT Treatment/Interventions ADLs/Self Care Home Management;Cryotherapy;Traction;Moist Heat;Electrical Stimulation;DME Instruction;Gait training;Stair training;Functional mobility training;Therapeutic activities;Therapeutic exercise;Balance training;Neuromuscular re-education;Patient/family education;Passive  range of motion;Dry needling;Manual techniques;Joint Manipulations;Spinal Manipulations    PT Next Visit Plan balance training, LE strength/power, R ankle strengthening as  tolerated    PT Home Exercise Plan Medbridge Access Code: [email or web address]    Consulted and Agree with Plan of Care Patient               Patient will benefit from skilled therapeutic intervention in order to improve the following deficits and impairments:  Abnormal gait, Decreased knowledge of use of DME, Impaired sensation, Improper body mechanics, Pain, Decreased coordination, Decreased mobility, Postural dysfunction, Decreased activity tolerance, Decreased endurance, Decreased range of motion, Decreased strength, Hypomobility, Impaired perceived functional ability, Impaired UE functional use, Decreased balance, Difficulty walking, Impaired flexibility  Visit Diagnosis: Chronic bilateral low back pain, unspecified whether sciatica present  Unsteadiness on feet  History of falling  Difficulty in walking, not elsewhere classified     Problem List Patient Active Problem List   Diagnosis Date Noted   Hearing loss due to cerumen impaction 03/01/2021   Atypical chest pain 07/10/2020   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   Unsteadiness on feet 11/08/2019   History of spinal surgery 01/17/2019   Neck pain 01/17/2019   Weakness of hand 01/17/2019   Benign prostatic hyperplasia with urinary obstruction 38/25/0539   Systolic murmur 76/73/4193   Right carpal tunnel syndrome 02/02/2018   Ulnar neuropathy at elbow, left 02/02/2018   Peripheral neuropathy 01/26/2018   Bilateral buttock pain 06/29/2016   Failed vision screen 06/29/2016   Advanced care planning/counseling discussion 06/21/2015   Medicare annual wellness visit, subsequent 06/18/2014   Health maintenance examination 06/18/2014   Chronic ankle pain 06/18/2014   Incomplete emptying of bladder 06/07/2013   Vitamin D deficiency 05/20/2013   THYROID NODULE 05/17/2007   HLD (hyperlipidemia) 05/17/2007   TREMOR, ESSENTIAL 05/17/2007   Essential hypertension 05/17/2007   Cervical spondylosis with myelopathy and radiculopathy  05/17/2007   Seizure disorder ([place]) 05/17/2007   Sherryll Burger, SPT  Student physical therapist under direct supervision of licensed physical therapists during the entirety of the session.   Everlean Alstrom. Graylon Good, PT, DPT 07/09/21, 4:23 PM  [facility] [address], [place], [address] Phone: [phone]   Fax:  [phone]  Name: MACLAIN COHRON MRN: [id number] Date of Birth: [birth year]

## 2021-07-10 ENCOUNTER — Encounter: Payer: Medicare Other | Admitting: Physical Therapy

## 2021-07-15 ENCOUNTER — Ambulatory Visit: Payer: Medicare Other | Admitting: Physical Therapy

## 2021-07-15 ENCOUNTER — Encounter: Payer: Self-pay | Admitting: Physical Therapy

## 2021-07-15 DIAGNOSIS — R2681 Unsteadiness on feet: Secondary | ICD-10-CM

## 2021-07-15 DIAGNOSIS — Z9181 History of falling: Secondary | ICD-10-CM

## 2021-07-15 DIAGNOSIS — G8929 Other chronic pain: Secondary | ICD-10-CM

## 2021-07-15 DIAGNOSIS — M545 Low back pain, unspecified: Secondary | ICD-10-CM

## 2021-07-15 DIAGNOSIS — R262 Difficulty in walking, not elsewhere classified: Secondary | ICD-10-CM

## 2021-07-15 NOTE — Therapy (Signed)
[facility] [address] [place], [address] Phone: [phone]   Fax:  [phone]  Physical Therapy Treatment  Patient Details  Name: Mark Holmes MRN: [id number] Date of Birth: 25-Aug-1935 Referring Provider (PT): Allene Dillon, NP   Encounter Date: 07/15/2021   PT End of Session - 07/15/21 1539     Visit Number 31    Number of Visits 44    Date for PT Re-Evaluation 08/21/21    Authorization Type [employer] MEDICARE reporting period from 07/09/2021    Progress Note Due on Visit 27    PT Start Time 1435    PT Stop Time 1515    PT Time Calculation (min) 40 min    Equipment Utilized During Treatment Gait belt    Activity Tolerance Patient tolerated treatment well    Behavior During Therapy [facility] for tasks assessed/performed             Past Medical History:  Diagnosis Date   Benign prostatic hypertrophy with elevated PSA    per Dr. Jacqlyn Larsen   History of CT scan of brain 1992   normal   Hyperglycemia 01/2006   102   Hyperlipemia    Hypertension    Seizure disorder [facility])     Past Surgical History:  Procedure Laterality Date   ANTERIOR CERVICAL DECOMP/DISCECTOMY FUSION  05/2018   for myelopathy -C3/4 ([facility])   CARPAL TUNNEL RELEASE Right 06/2019   ([facility])   CATARACT EXTRACTION W/ INTRAOCULAR LENS IMPLANT Right 2012   [facility]   History of EEG  1985   normal   hospitalization  1981   observation for seizuare   TONSILLECTOMY     TRANSURETHRAL RESECTION OF PROSTATE  10/2005   [place] RESECTION OF PROSTATE  08/2017   (rpt) [place]    There were no vitals filed for this visit.   Subjective Assessment - 07/15/21 1537     Subjective Patient reports he is feeling well today upon arrival with no pain. Reports no falls since last PT session and no excessive pain/soreness following last PT session. Reports lower leg discomfort after sitting for a while continues  to bother him or intermittant low back pain.    Pertinent History Patient is a 86 y.o. male who presents to outpatient physical therapy with a referral for medical diagnosis difficulty balancing, lumbar DDD (degenerative disc disease) with request for balance therapy. This patient's chief complaints consist of discomfort of the B LE below the knee, pain in the low back with standing, and difficulty balancing in the setting of chronic R ankle pain, B hand neuropathy symptoms and history of cervical spine disorder with ACDF at C3-4 for myelopathy leading to the following functional deficits: cannot do what he wants to outside because he is afraid to fall, taking the puppy out, prolonged standing, household chores, community ambulation, grooming, climbing stairs, yard work, sleeping, fall risk, etc. .  Relevant past medical history and comorbidities include seizure disorder (controlled with meds), hx R ankle fracture (wears an air brace to prevent pain for years), ACDF (05/2018 for myelopathy), R  Carpal tunnel release (06/2019), BPH, symptoms of peripheral neuropathy at all limbs (fine motor skills in B UE affected), CTS, perfuse degenerative changes on lumbar and cervical spine imaging (see chart for details).    Limitations Standing;Walking;Sitting;Other (comment);House hold activities    How long can you sit comfortably? "a long time' (but will feel it when he  stands up)    How long can you stand comfortably? can finish the task but gets really uncomfortable in the back    Diagnostic tests Cervical spine MRI report 11/29/2020: " IMPRESSION:  1. Neurofibroma of the left C2 nerve as seen previously, maximal  transverse diameter 11-12 mm.  2. Previous ACDF C3-4. Canal stenosis remains at this level with AP  diameter of 7 mm, without worsening. No actual cord compression.  3. Spondylosis and facet arthropathy with degenerative  anterolisthesis of C5-6 and C7-T1. Canal narrowing but no cord  compression.  4.  Foraminal stenosis primarily due to facet osteophytes and  uncovertebral osteophytes that could cause neural compression  bilateral at C2-3, bilateral at C3-4, bilateral at C4-5, bilateral  but left more than right at C5-6, bilateral but left more than right  at C6-7 and bilateral but left more than right at C7-T1." Lumbar spine MRI report 11/29/2020: "IMPRESSION:  1. Degenerative disc disease and degenerative facet disease  throughout the lumbar region which could be associated with back  pain. Thoracolumbar scoliotic curvature.  2. L2-3: Moderate central canal stenosis. Bilateral lateral recess  and foraminal stenosis left worse than right. Neural compression  could occur at this level, particularly on the left.  3. L3-4: Bilateral lateral recess and foraminal stenosis, right  worse than left. Neural compression could occur on either side,  particularly on the right.  4. L4-5: Bilateral lateral recess and foraminal stenosis, right more  than left. Neural compression could occur on either side,  particularly the right.  5. L5-S1: Bilateral foraminal narrowing with some potential to  affect either exiting L5 nerve."    Patient Stated Goals he would like to walk "like normal."    Currently in Pain? No/denies              TREATMENT: Shoes/R ankle brace doffed until exiting clinic.   Neuromuscular Re-education: to improve, balance, postural strength, muscle activation patterns, and stabilization strength required for functional activities (CGA - min A as needed for safety):  - ball toss on airex pad, pink theraball at rebounder, 1x30 each direction forward and each side. CGA- mod A to prevent falls (2 instances of R ankle "giving out" suddenly requiring modA from PT to prevent fall).    Hurdle activities (6 inch): - forward step and push back to backward step and push back with intermittent UE support. 1x10 each side (CGA) (pt stands between 2 hurdles, one in front and one behind; steps forward with  on leg over hurdle then pushes back to staring position, then steps backwards over back hurdle with same leg and pushes back to starting position). - side step and push back over hurdle, alternating sides, 1x20 each side, intermittent UE support, CGA for safety. (Pt stands between two hurdles one placed on each side of patient, steps over hurdle to one side with one foot, then pushes back to stand in the middle of the hurdles, repeat on other side).  - education on role of PT with peripheral neuropathy   Therapeutic exercise: to centralize symptoms and improve ROM, strength, muscular endurance, and activity tolerance required for successful completion of functional activities.  - runner's step up to 12 inch stool with U UE support and CGA, 3x10 each side focusing on pushing off/eccentric lower with R foot on the floor and strong hip/knee extension from foot on step (especially right). CGA for safety.  - ambulation x200 feet focusing on push off from right foot at toe off, CGA  for safety.    Pt required multimodal cuing for proper technique and to facilitate improved neuromuscular control, strength, range of motion, and functional ability resulting in improved performance and form. (SBA - CGA for standing activities due to risk of occasional giving way of B LE).    Manual Therapy: to increase joint mobility of the ankle to support increased overall ROM and provide relief to ankle musculature. SUPINE POSITION: R ankle - Supine subtalar medial glide in order to increase eversion of the R ankle, Grades III-IV, 2x30 seconds.  - Supine subtalar lateral glide in order to increase inversion of the R ankle, Grades III-IV, 1x30 seconds - Supine talocural distraction to R ankle, 2x30 seconds to increase overall ROM    PT Education - 07/15/21 1538     Education Details assessment purpose/form.    Person(s) Educated Patient    Methods Explanation;Demonstration;Tactile cues;Verbal cues    Comprehension  Verbalized understanding;Returned demonstration;Verbal cues required;Tactile cues required;Need further instruction              PT Short Term Goals - 04/15/21 1000       PT SHORT TERM GOAL #1   Title Be independent with initial home exercise program for self-management of symptoms.    Baseline Initial HEP to be established at visit 2 as appropriate (03/13/2021); Initial HEP established visit 2 (03/18/2021);    Time 3    Period Weeks    Status Achieved    Target Date 04/03/21               PT Long Term Goals - 07/09/21 1158       PT LONG TERM GOAL #1   Title Be independent with a long-term home exercise program for self-management of symptoms.    Baseline Initial HEP to be established at visit 2 as appropriate (03/13/2021); Initial HEP provided visit 2 (03/18/2021); continues to participate in appropriate HEP (05/29/2021); continues to participate in HEP (07/09/2021);    Time 12    Period Weeks    Status Partially Met   TARGET DATE FOR ALL LONG TERM GOALS: 06/01/2021, updated to 08/21/2021 for unmet goals     PT LONG TERM GOAL #2   Title Demonstrate improved FOTO score by 10 units to demonstrate improvement in overall condition and self-reported functional ability.    Baseline to be measured visit 2 as appropriate (03/13/2021); 51 (03/18/2021); 51 (04/15/21): 54 (05/29/2021); 58 (07/09/2021);    Time 12    Period Weeks    Status Partially Met      PT LONG TERM GOAL #3   Title Patient will score equal or greater than 25/30 on Functional Gait Assessment to demonstrate reduction in fall risk to low fall risk classification.    Baseline to be measured vsiti 2 as appropriate (03/13/2021); 20/30 moderate fall risk (03/18/2021); 23/30 (05/29/2021); 22/30 (07/09/2021):    Time 12    Period Weeks    Status On-going      PT LONG TERM GOAL #4   Title Patient will ambulate equal or greater than 1000 feet during 6 minute walk test to demonstrate improved community mobility.    Baseline to be  measured vsiti 2 as appropriate (03/13/2021); 820 feet with SPC(03/18/2021); 1263f, 4/10 back pain peak that resolves upon rest (04/15/21); 1000 feet with increasing B SIJ pain (05/29/2021); 1100 feet with increasing B low back and R ankle pain (07/09/2021);    Time 12    Period Weeks    Status Achieved  PT LONG TERM GOAL #5   Title Complete community, work and/or recreational activities with 50% less limitation due to current condition.    Baseline Functional Limitations: difficulty with activities that require standing balance including outside activities, taking the puppy out, prolonged standing, household chores, community ambulation, grooming, climbing stairs, yard work, sleeping, fall risk, etc. He states he cannot do what he wants to outside because he is afraid to fall. He can do what he wants down on his knees, but not standing up. He has a new puppy and it is hard to keep his balance when she is pulling him (03/13/2021); states the dog doesn't pull him off balance any more and he was able to trim trees with the extended trimmer, back pain in the morning is better (05/08/2021); Patient reports difficulty with prolonged walking and going upstairs. States that his legs fatigue limits his ability to performing these tasks. Patient reports goal of walking dogs on outside uneven trails (07/09/2021);    Time 12    Period Weeks    Status Partially Met                   Plan - 07/15/21 1602     Clinical Impression Statement Pateint tolerated treatment well overall with some discomfort in his right ankle that he felt comfortable working through. Continued to focus on LE strength and neuromuscular control for improved balance and functional ability. Patient remains a fall risk and his R LE does buckle at times when his R ankle fatigues. Patient would benefit from continued management of limiting condition by skilled physical therapist to address remaining impairments and functional limitations to  work towards stated goals and return to PLOF or maximal functional independence.    Personal Factors and Comorbidities Age;Comorbidity 3+;Past/Current Experience;Fitness;Time since onset of injury/illness/exacerbation    Comorbidities Relevant past medical history and comorbidities include seizure disorder (controlled with meds), hx R ankle fracture (wears an air brace to prevent pain for years), ACDF (05/2018 for myelopathy), R  Carpal tunnel release (06/2019), BPH, symptoms of peripheral neuropathy at all limbs (fine motor skills in B UE affected), CTS, perfuse degenerative changes on lumbar and cervical spine imaging (see chart for details).    Examination-Activity Limitations Bend;Locomotion Level;Stand;Carry;Sleep;Hygiene/Grooming;Caring for Others;Lift;Stairs;Squat    Examination-Participation Restrictions Shop;Community Activity;Meal Prep;Yard Work    [employer] Evolving/Moderate complexity    Rehab Potential Fair    PT Frequency 2x / week    PT Duration 12 weeks    PT Treatment/Interventions ADLs/Self Care Home Management;Cryotherapy;Traction;Moist Heat;Electrical Stimulation;DME Instruction;Gait training;Stair training;Functional mobility training;Therapeutic activities;Therapeutic exercise;Balance training;Neuromuscular re-education;Patient/family education;Passive range of motion;Dry needling;Manual techniques;Joint Manipulations;Spinal Manipulations    PT Next Visit Plan balance training, LE strength/power, R ankle strengthening as tolerated    PT Home Exercise Plan Medbridge Access Code: [email or web address]    Consulted and Agree with Plan of Care Patient             Patient will benefit from skilled therapeutic intervention in order to improve the following deficits and impairments:  Abnormal gait, Decreased knowledge of use of DME, Impaired sensation, Improper body mechanics, Pain, Decreased coordination, Decreased mobility, Postural dysfunction, Decreased activity  tolerance, Decreased endurance, Decreased range of motion, Decreased strength, Hypomobility, Impaired perceived functional ability, Impaired UE functional use, Decreased balance, Difficulty walking, Impaired flexibility  Visit Diagnosis: Chronic bilateral low back pain, unspecified whether sciatica present  Unsteadiness on feet  History of falling  Difficulty in walking, not elsewhere classified     Problem  List Patient Active Problem List   Diagnosis Date Noted   Hearing loss due to cerumen impaction 03/01/2021   Atypical chest pain 07/10/2020   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   Unsteadiness on feet 11/08/2019   History of spinal surgery 01/17/2019   Neck pain 01/17/2019   Weakness of hand 01/17/2019   Benign prostatic hyperplasia with urinary obstruction 88/26/6664   Systolic murmur 86/16/1224   Right carpal tunnel syndrome 02/02/2018   Ulnar neuropathy at elbow, left 02/02/2018   Peripheral neuropathy 01/26/2018   Bilateral buttock pain 06/29/2016   Failed vision screen 06/29/2016   Advanced care planning/counseling discussion 06/21/2015   Medicare annual wellness visit, subsequent 06/18/2014   Health maintenance examination 06/18/2014   Chronic ankle pain 06/18/2014   Incomplete emptying of bladder 06/07/2013   Vitamin D deficiency 05/20/2013   THYROID NODULE 05/17/2007   HLD (hyperlipidemia) 05/17/2007   TREMOR, ESSENTIAL 05/17/2007   Essential hypertension 05/17/2007   Cervical spondylosis with myelopathy and radiculopathy 05/17/2007   Seizure disorder ([place]) 05/17/2007    Everlean Alstrom. Graylon Good, PT, DPT 07/15/21, 4:03 PM  [facility] [address] [place], [address] Phone: [phone]   Fax:  [phone]  Name: Mark Holmes MRN: [id number] Date of Birth: 1935-04-12

## 2021-07-17 ENCOUNTER — Ambulatory Visit: Payer: Medicare Other | Admitting: Physical Therapy

## 2021-07-17 ENCOUNTER — Encounter: Payer: Self-pay | Admitting: Physical Therapy

## 2021-07-17 DIAGNOSIS — R2681 Unsteadiness on feet: Secondary | ICD-10-CM | POA: Diagnosis not present

## 2021-07-17 DIAGNOSIS — M545 Low back pain, unspecified: Secondary | ICD-10-CM

## 2021-07-17 DIAGNOSIS — G8929 Other chronic pain: Secondary | ICD-10-CM

## 2021-07-17 DIAGNOSIS — R262 Difficulty in walking, not elsewhere classified: Secondary | ICD-10-CM

## 2021-07-17 DIAGNOSIS — Z9181 History of falling: Secondary | ICD-10-CM

## 2021-07-17 NOTE — Therapy (Signed)
[facility] [address], [place], [address] Phone: [phone]   Fax:  [phone]  Physical Therapy Treatment  Patient Details  Name: Mark Holmes MRN: [id number] Date of Birth: 1935/09/05 Referring Provider (PT): Allene Dillon, NP   Encounter Date: 07/17/2021   PT End of Session - 07/17/21 1537     Visit Number 32    Number of Visits 44    Date for PT Re-Evaluation 08/21/21    Authorization Type [employer] MEDICARE reporting period from 07/09/2021    Progress Note Due on Visit 89    PT Start Time 1433    PT Stop Time 1515    PT Time Calculation (min) 42 min    Equipment Utilized During Treatment Gait belt    Activity Tolerance Patient tolerated treatment well    Behavior During Therapy WFL for tasks assessed/performed             Past Medical History:  Diagnosis Date   Benign prostatic hypertrophy with elevated PSA    per Dr. Jacqlyn Larsen   History of CT scan of brain 1992   normal   Hyperglycemia 01/2006   102   Hyperlipemia    Hypertension    Seizure disorder [facility])     Past Surgical History:  Procedure Laterality Date   ANTERIOR CERVICAL DECOMP/DISCECTOMY FUSION  05/2018   for myelopathy -C3/4 ([facility])   CARPAL TUNNEL RELEASE Right 06/2019   ([facility])   CATARACT EXTRACTION W/ INTRAOCULAR LENS IMPLANT Right 2012   [facility]   History of EEG  1985   normal   hospitalization  1981   observation for seizuare   TONSILLECTOMY     TRANSURETHRAL RESECTION OF PROSTATE  10/2005   [facility] RESECTION OF PROSTATE  08/2017   (rpt) [facility]    There were no vitals filed for this visit.   Subjective Assessment - 07/17/21 1536     Subjective Patient arrives carrying [employer] with air cast on the right LE. States he has no pain and felt good after his last PT session.    Pertinent History Patient is a 86 y.o. male who presents to outpatient physical therapy with a referral  for medical diagnosis difficulty balancing, lumbar DDD (degenerative disc disease) with request for balance therapy. This patient's chief complaints consist of discomfort of the B LE below the knee, pain in the low back with standing, and difficulty balancing in the setting of chronic R ankle pain, B hand neuropathy symptoms and history of cervical spine disorder with ACDF at C3-4 for myelopathy leading to the following functional deficits: cannot do what he wants to outside because he is afraid to fall, taking the puppy out, prolonged standing, household chores, community ambulation, grooming, climbing stairs, yard work, sleeping, fall risk, etc. .  Relevant past medical history and comorbidities include seizure disorder (controlled with meds), hx R ankle fracture (wears an air brace to prevent pain for years), ACDF (05/2018 for myelopathy), R  Carpal tunnel release (06/2019), BPH, symptoms of peripheral neuropathy at all limbs (fine motor skills in B UE affected), CTS, perfuse degenerative changes on lumbar and cervical spine imaging (see chart for details).    Limitations Standing;Walking;Sitting;Other (comment);House hold activities    How long can you sit comfortably? "a long time' (but will feel it when he stands up)    How long can you stand comfortably? can finish the task but gets really uncomfortable in the  back    Diagnostic tests Cervical spine MRI report 11/29/2020: " IMPRESSION:  1. Neurofibroma of the left C2 nerve as seen previously, maximal  transverse diameter 11-12 mm.  2. Previous ACDF C3-4. Canal stenosis remains at this level with AP  diameter of 7 mm, without worsening. No actual cord compression.  3. Spondylosis and facet arthropathy with degenerative  anterolisthesis of C5-6 and C7-T1. Canal narrowing but no cord  compression.  4. Foraminal stenosis primarily due to facet osteophytes and  uncovertebral osteophytes that could cause neural compression  bilateral at C2-3, bilateral at C3-4,  bilateral at C4-5, bilateral  but left more than right at C5-6, bilateral but left more than right  at C6-7 and bilateral but left more than right at C7-T1." Lumbar spine MRI report 11/29/2020: "IMPRESSION:  1. Degenerative disc disease and degenerative facet disease  throughout the lumbar region which could be associated with back  pain. Thoracolumbar scoliotic curvature.  2. L2-3: Moderate central canal stenosis. Bilateral lateral recess  and foraminal stenosis left worse than right. Neural compression  could occur at this level, particularly on the left.  3. L3-4: Bilateral lateral recess and foraminal stenosis, right  worse than left. Neural compression could occur on either side,  particularly on the right.  4. L4-5: Bilateral lateral recess and foraminal stenosis, right more  than left. Neural compression could occur on either side,  particularly the right.  5. L5-S1: Bilateral foraminal narrowing with some potential to  affect either exiting L5 nerve."    Patient Stated Goals he would like to walk "like normal."    Currently in Pain? No/denies             TREATMENT: Shoes/R ankle brace doffed until exiting clinic.  Manual Therapy: to increase joint mobility of the ankle to support increased overall ROM and provide relief to ankle musculature. SUPINE POSITION: R ankle - Supine subtalar medial glide in order to increase eversion of the R ankle, Grades III-IV, 2x15-30 seconds.  - Supine subtalar lateral glide in order to increase inversion of the R ankle, Grades III-IV, 2x15-30 seconds. - Supine talocural distraction to R ankle, 2x1530 seconds to increase overall ROM  - supine talocrural posterior and anterior glide, grade III-IV, 2x15-30 seconds each way - Supine R ankle PROM inversion, plantarflexion, eversion, general movement through the foot for improved motion.  (Painful at end range plantarflexion with toe flexion).   Neuromuscular Re-education: to improve, balance, postural strength,  muscle activation patterns, and stabilization strength required for functional activities (CGA - min A as needed for safety): - ball toss on airex pad, pink theraball at rebounder, 1x30 each direction forward and each side. CGA- min A for safety. Has difficulty stepping off airex without falling.  - karaoke stepping, 3x20-30 steps each side with CGA-min A (more difficult when traveling right).  - agility ladder drill: forward/backwards steps with both feet into each square, 1x length of ladder each direction with CGA for safety.    Therapeutic exercise: to centralize symptoms and improve ROM, strength, muscular endurance, and activity tolerance required for successful completion of functional activities.  - runner's step up to 12 inch stool with U UE support and CGA, 3x10 each side focusing on pushing off/eccentric lower with R foot on the floor and strong hip/knee extension from foot on step (especially right). CGA for safety.    Pt required multimodal cuing for proper technique and to facilitate improved neuromuscular control, strength, range of motion, and functional ability resulting in  improved performance and form. (SBA - min A for standing activities due to risk of occasional giving way of B LE).     PT Education - 07/17/21 1537     Education Details assessment purpose/form.    Person(s) Educated Patient    Methods Explanation;Demonstration;Tactile cues;Verbal cues    Comprehension Verbalized understanding;Returned demonstration;Verbal cues required;Tactile cues required              PT Short Term Goals - 04/15/21 1000       PT SHORT TERM GOAL #1   Title Be independent with initial home exercise program for self-management of symptoms.    Baseline Initial HEP to be established at visit 2 as appropriate (03/13/2021); Initial HEP established visit 2 (03/18/2021);    Time 3    Period Weeks    Status Achieved    Target Date 04/03/21               PT Long Term Goals -  07/09/21 1158       PT LONG TERM GOAL #1   Title Be independent with a long-term home exercise program for self-management of symptoms.    Baseline Initial HEP to be established at visit 2 as appropriate (03/13/2021); Initial HEP provided visit 2 (03/18/2021); continues to participate in appropriate HEP (05/29/2021); continues to participate in HEP (07/09/2021);    Time 12    Period Weeks    Status Partially Met   TARGET DATE FOR ALL LONG TERM GOALS: 06/01/2021, updated to 08/21/2021 for unmet goals     PT LONG TERM GOAL #2   Title Demonstrate improved FOTO score by 10 units to demonstrate improvement in overall condition and self-reported functional ability.    Baseline to be measured visit 2 as appropriate (03/13/2021); 51 (03/18/2021); 51 (04/15/21): 54 (05/29/2021); 58 (07/09/2021);    Time 12    Period Weeks    Status Partially Met      PT LONG TERM GOAL #3   Title Patient will score equal or greater than 25/30 on Functional Gait Assessment to demonstrate reduction in fall risk to low fall risk classification.    Baseline to be measured vsiti 2 as appropriate (03/13/2021); 20/30 moderate fall risk (03/18/2021); 23/30 (05/29/2021); 22/30 (07/09/2021):    Time 12    Period Weeks    Status On-going      PT LONG TERM GOAL #4   Title Patient will ambulate equal or greater than 1000 feet during 6 minute walk test to demonstrate improved community mobility.    Baseline to be measured vsiti 2 as appropriate (03/13/2021); 820 feet with SPC(03/18/2021); 1239f, 4/10 back pain peak that resolves upon rest (04/15/21); 1000 feet with increasing B SIJ pain (05/29/2021); 1100 feet with increasing B low back and R ankle pain (07/09/2021);    Time 12    Period Weeks    Status Achieved      PT LONG TERM GOAL #5   Title Complete community, work and/or recreational activities with 50% less limitation due to current condition.    Baseline Functional Limitations: difficulty with activities that require standing balance  including outside activities, taking the puppy out, prolonged standing, household chores, community ambulation, grooming, climbing stairs, yard work, sleeping, fall risk, etc. He states he cannot do what he wants to outside because he is afraid to fall. He can do what he wants down on his knees, but not standing up. He has a new puppy and it is hard to keep his balance when  she is pulling him (03/13/2021); states the dog doesn't pull him off balance any more and he was able to trim trees with the extended trimmer, back pain in the morning is better (05/08/2021); Patient reports difficulty with prolonged walking and going upstairs. States that his legs fatigue limits his ability to performing these tasks. Patient reports goal of walking dogs on outside uneven trails (07/09/2021);    Time 12    Period Weeks    Status Partially Met                   Plan - 07/17/21 1548     Clinical Impression Statement Patient tolerated treatment well overall and continued to work on improved R ankle strength/mobility and functional strength and balance to improve fall risk and activity tolerance. Patient continues to have moments of unsteadiness and requires guarding and cuing for safe completion of activities. Is showing improved motion at the right ankle. Patient would benefit from continued management of limiting condition by skilled physical therapist to address remaining impairments and functional limitations to work towards stated goals and return to PLOF or maximal functional independence.    Personal Factors and Comorbidities Age;Comorbidity 3+;Past/Current Experience;Fitness;Time since onset of injury/illness/exacerbation    Comorbidities Relevant past medical history and comorbidities include seizure disorder (controlled with meds), hx R ankle fracture (wears an air brace to prevent pain for years), ACDF (05/2018 for myelopathy), R  Carpal tunnel release (06/2019), BPH, symptoms of peripheral neuropathy at  all limbs (fine motor skills in B UE affected), CTS, perfuse degenerative changes on lumbar and cervical spine imaging (see chart for details).    Examination-Activity Limitations Bend;Locomotion Level;Stand;Carry;Sleep;Hygiene/Grooming;Caring for Others;Lift;Stairs;Squat    Examination-Participation Restrictions Shop;Community Activity;Meal Prep;Yard Work    [employer] Evolving/Moderate complexity    Rehab Potential Fair    PT Frequency 2x / week    PT Duration 12 weeks    PT Treatment/Interventions ADLs/Self Care Home Management;Cryotherapy;Traction;Moist Heat;Electrical Stimulation;DME Instruction;Gait training;Stair training;Functional mobility training;Therapeutic activities;Therapeutic exercise;Balance training;Neuromuscular re-education;Patient/family education;Passive range of motion;Dry needling;Manual techniques;Joint Manipulations;Spinal Manipulations    PT Next Visit Plan balance training, LE strength/power, R ankle strengthening as tolerated    PT Home Exercise Plan Medbridge Access Code: [email or web address]    Consulted and Agree with Plan of Care Patient             Patient will benefit from skilled therapeutic intervention in order to improve the following deficits and impairments:  Abnormal gait, Decreased knowledge of use of DME, Impaired sensation, Improper body mechanics, Pain, Decreased coordination, Decreased mobility, Postural dysfunction, Decreased activity tolerance, Decreased endurance, Decreased range of motion, Decreased strength, Hypomobility, Impaired perceived functional ability, Impaired UE functional use, Decreased balance, Difficulty walking, Impaired flexibility  Visit Diagnosis: Chronic bilateral low back pain, unspecified whether sciatica present  Unsteadiness on feet  History of falling  Difficulty in walking, not elsewhere classified     Problem List Patient Active Problem List   Diagnosis Date Noted   Hearing loss due to cerumen  impaction 03/01/2021   Atypical chest pain 07/10/2020   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   Unsteadiness on feet 11/08/2019   History of spinal surgery 01/17/2019   Neck pain 01/17/2019   Weakness of hand 01/17/2019   Benign prostatic hyperplasia with urinary obstruction 73/22/0254   Systolic murmur 27/05/2375   Right carpal tunnel syndrome 02/02/2018   Ulnar neuropathy at elbow, left 02/02/2018   Peripheral neuropathy 01/26/2018   Bilateral buttock pain 06/29/2016   Failed vision screen 06/29/2016  Advanced care planning/counseling discussion 06/21/2015   Medicare annual wellness visit, subsequent 06/18/2014   Health maintenance examination 06/18/2014   Chronic ankle pain 06/18/2014   Incomplete emptying of bladder 06/07/2013   Vitamin D deficiency 05/20/2013   THYROID NODULE 05/17/2007   HLD (hyperlipidemia) 05/17/2007   TREMOR, ESSENTIAL 05/17/2007   Essential hypertension 05/17/2007   Cervical spondylosis with myelopathy and radiculopathy 05/17/2007   Seizure disorder ([place]) 05/17/2007   Sherryll Burger, SPT  Student physical therapist under direct supervision of licensed physical therapists during the entirety of the session.   Everlean Alstrom. Graylon Good, PT, DPT 07/17/21, 3:51 PM   [facility] [address] [place], [address] Phone: [phone]   Fax:  [phone]  Name: Mark Holmes MRN: [id number] Date of Birth: [birth year]

## 2021-07-22 ENCOUNTER — Encounter: Payer: Self-pay | Admitting: Physical Therapy

## 2021-07-22 ENCOUNTER — Ambulatory Visit: Payer: Medicare Other | Attending: Family Medicine | Admitting: Physical Therapy

## 2021-07-22 DIAGNOSIS — M545 Low back pain, unspecified: Secondary | ICD-10-CM | POA: Insufficient documentation

## 2021-07-22 DIAGNOSIS — R2681 Unsteadiness on feet: Secondary | ICD-10-CM | POA: Diagnosis present

## 2021-07-22 DIAGNOSIS — R262 Difficulty in walking, not elsewhere classified: Secondary | ICD-10-CM | POA: Insufficient documentation

## 2021-07-22 DIAGNOSIS — G8929 Other chronic pain: Secondary | ICD-10-CM | POA: Diagnosis present

## 2021-07-22 DIAGNOSIS — Z9181 History of falling: Secondary | ICD-10-CM | POA: Diagnosis present

## 2021-07-22 NOTE — Therapy (Signed)
[facility] [address] [place], [address] Phone: [phone]   Fax:  [phone]  Physical Therapy Treatment  Patient Details  Name: OSHAY STRANAHAN MRN: [id number] Date of Birth: [birth year] Referring Provider (PT): Allene Dillon, NP   Encounter Date: 07/22/2021   PT End of Session - 07/22/21 1440     Visit Number 33    Number of Visits 44    Date for PT Re-Evaluation 08/21/21    Authorization Type [employer] MEDICARE reporting period from 07/09/2021    Progress Note Due on Visit 40    PT Start Time 1433    PT Stop Time 1513    PT Time Calculation (min) 40 min    Equipment Utilized During Treatment Gait belt    Activity Tolerance Patient tolerated treatment well    Behavior During Therapy [facility] for tasks assessed/performed             Past Medical History:  Diagnosis Date   Benign prostatic hypertrophy with elevated PSA    per Dr. Jacqlyn Larsen   History of CT scan of brain 1992   normal   Hyperglycemia 01/2006   102   Hyperlipemia    Hypertension    Seizure disorder [facility])     Past Surgical History:  Procedure Laterality Date   ANTERIOR CERVICAL DECOMP/DISCECTOMY FUSION  05/2018   for myelopathy -C3/4 ([facility])   CARPAL TUNNEL RELEASE Right 06/2019   ([facility])   CATARACT EXTRACTION W/ INTRAOCULAR LENS IMPLANT Right 2012   [facility]   History of EEG  1985   normal   hospitalization  1981   observation for seizuare   TONSILLECTOMY     TRANSURETHRAL RESECTION OF PROSTATE  10/2005   [place] RESECTION OF PROSTATE  08/2017   (rpt) [place]    There were no vitals filed for this visit.   Subjective Assessment - 07/22/21 1438     Subjective Patient arrives carrying [facility] with air cast on the right LE. Reports no pain upon arrival. States he got up with the dog last night at 1am and it felt really good to walk barefoot when he took her out. Denies any falls since last PT  session. He is doing his HEP.    Pertinent History Patient is a 86 y.o. male who presents to outpatient physical therapy with a referral for medical diagnosis difficulty balancing, lumbar DDD (degenerative disc disease) with request for balance therapy. This patient's chief complaints consist of discomfort of the B LE below the knee, pain in the low back with standing, and difficulty balancing in the setting of chronic R ankle pain, B hand neuropathy symptoms and history of cervical spine disorder with ACDF at C3-4 for myelopathy leading to the following functional deficits: cannot do what he wants to outside because he is afraid to fall, taking the puppy out, prolonged standing, household chores, community ambulation, grooming, climbing stairs, yard work, sleeping, fall risk, etc. .  Relevant past medical history and comorbidities include seizure disorder (controlled with meds), hx R ankle fracture (wears an air brace to prevent pain for years), ACDF (05/2018 for myelopathy), R  Carpal tunnel release (06/2019), BPH, symptoms of peripheral neuropathy at all limbs (fine motor skills in B UE affected), CTS, perfuse degenerative changes on lumbar and cervical spine imaging (see chart for details).    Limitations Standing;Walking;Sitting;Other (comment);House hold activities    How long can you sit comfortably? "a long  time' (but will feel it when he stands up)    How long can you stand comfortably? can finish the task but gets really uncomfortable in the back    Diagnostic tests Cervical spine MRI report 11/29/2020: " IMPRESSION:  1. Neurofibroma of the left C2 nerve as seen previously, maximal  transverse diameter 11-12 mm.  2. Previous ACDF C3-4. Canal stenosis remains at this level with AP  diameter of 7 mm, without worsening. No actual cord compression.  3. Spondylosis and facet arthropathy with degenerative  anterolisthesis of C5-6 and C7-T1. Canal narrowing but no cord  compression.  4. Foraminal stenosis  primarily due to facet osteophytes and  uncovertebral osteophytes that could cause neural compression  bilateral at C2-3, bilateral at C3-4, bilateral at C4-5, bilateral  but left more than right at C5-6, bilateral but left more than right  at C6-7 and bilateral but left more than right at C7-T1." Lumbar spine MRI report 11/29/2020: "IMPRESSION:  1. Degenerative disc disease and degenerative facet disease  throughout the lumbar region which could be associated with back  pain. Thoracolumbar scoliotic curvature.  2. L2-3: Moderate central canal stenosis. Bilateral lateral recess  and foraminal stenosis left worse than right. Neural compression  could occur at this level, particularly on the left.  3. L3-4: Bilateral lateral recess and foraminal stenosis, right  worse than left. Neural compression could occur on either side,  particularly on the right.  4. L4-5: Bilateral lateral recess and foraminal stenosis, right more  than left. Neural compression could occur on either side,  particularly the right.  5. L5-S1: Bilateral foraminal narrowing with some potential to  affect either exiting L5 nerve."    Patient Stated Goals he would like to walk "like normal."    Currently in Pain? No/denies             TREATMENT: Shoes/R ankle brace doffed until exiting clinic.   Manual Therapy: to increase joint mobility of the ankle to support increased overall ROM and provide relief to ankle musculature. SUPINE POSITION: R ankle - Supine subtalar medial glide in order to increase eversion of the R ankle, Grades III-IV, 2x15, 1x30 seconds.  - Supine subtalar lateral glide in order to increase inversion of the R ankle, Grades III-IV, 2x15, 1x30 seconds. - Supine talocural distraction to R ankle, 2x15, 1x30  seconds to increase overall ROM  - Supine talocrural posterior and anterior glide, grade III-IV, 2x15, 1x30  seconds each way   Neuromuscular Re-education: to improve, balance, postural strength, muscle  activation patterns, and stabilization strength required for functional activities (CGA - min A as needed for safety): - ball toss on airex pad, pink theraball at rebounder, 1x30-50 each direction forward and each side. CGA- min A for safety. One instance of R ankle "giving way" leading to loss of balance requiring PT assistance to recover.  - agility ladder drill: forward/backwards steps with both feet into each square, 2x length of ladder each direction with CGA for safety. Side to side through each square with both feet  with CGA, 4x length of ladder.    Therapeutic exercise: to centralize symptoms and improve ROM, strength, muscular endurance, and activity tolerance required for successful completion of functional activities.  - runner's step up to 12 inch stool with U UE support and CGA, 3x10 each side focusing on pushing off/eccentric lower with R foot on the floor and strong hip/knee extension from foot on step (especially right). CGA for safety.   Pt required multimodal  cuing for proper technique and to facilitate improved neuromuscular control, strength, range of motion, and functional ability resulting in improved performance and form. (SBA - min A for standing activities due to risk of occasional giving way of B LE).    PT Education - 07/22/21 1439     Education Details intervention/exercise purpose/form.    Person(s) Educated Patient    Methods Explanation;Demonstration;Tactile cues;Verbal cues    Comprehension Verbalized understanding;Returned demonstration;Verbal cues required;Tactile cues required;Need further instruction              PT Short Term Goals - 04/15/21 1000       PT SHORT TERM GOAL #1   Title Be independent with initial home exercise program for self-management of symptoms.    Baseline Initial HEP to be established at visit 2 as appropriate (03/13/2021); Initial HEP established visit 2 (03/18/2021);    Time 3    Period Weeks    Status Achieved    Target Date  04/03/21               PT Long Term Goals - 07/09/21 1158       PT LONG TERM GOAL #1   Title Be independent with a long-term home exercise program for self-management of symptoms.    Baseline Initial HEP to be established at visit 2 as appropriate (03/13/2021); Initial HEP provided visit 2 (03/18/2021); continues to participate in appropriate HEP (05/29/2021); continues to participate in HEP (07/09/2021);    Time 12    Period Weeks    Status Partially Met   TARGET DATE FOR ALL LONG TERM GOALS: 06/01/2021, updated to 08/21/2021 for unmet goals     PT LONG TERM GOAL #2   Title Demonstrate improved FOTO score by 10 units to demonstrate improvement in overall condition and self-reported functional ability.    Baseline to be measured visit 2 as appropriate (03/13/2021); 51 (03/18/2021); 51 (04/15/21): 54 (05/29/2021); 58 (07/09/2021);    Time 12    Period Weeks    Status Partially Met      PT LONG TERM GOAL #3   Title Patient will score equal or greater than 25/30 on Functional Gait Assessment to demonstrate reduction in fall risk to low fall risk classification.    Baseline to be measured vsiti 2 as appropriate (03/13/2021); 20/30 moderate fall risk (03/18/2021); 23/30 (05/29/2021); 22/30 (07/09/2021):    Time 12    Period Weeks    Status On-going      PT LONG TERM GOAL #4   Title Patient will ambulate equal or greater than 1000 feet during 6 minute walk test to demonstrate improved community mobility.    Baseline to be measured vsiti 2 as appropriate (03/13/2021); 820 feet with SPC(03/18/2021); 1264f, 4/10 back pain peak that resolves upon rest (04/15/21); 1000 feet with increasing B SIJ pain (05/29/2021); 1100 feet with increasing B low back and R ankle pain (07/09/2021);    Time 12    Period Weeks    Status Achieved      PT LONG TERM GOAL #5   Title Complete community, work and/or recreational activities with 50% less limitation due to current condition.    Baseline Functional Limitations:  difficulty with activities that require standing balance including outside activities, taking the puppy out, prolonged standing, household chores, community ambulation, grooming, climbing stairs, yard work, sleeping, fall risk, etc. He states he cannot do what he wants to outside because he is afraid to fall. He can do what he wants down on  his knees, but not standing up. He has a new puppy and it is hard to keep his balance when she is pulling him (03/13/2021); states the dog doesn't pull him off balance any more and he was able to trim trees with the extended trimmer, back pain in the morning is better (05/08/2021); Patient reports difficulty with prolonged walking and going upstairs. States that his legs fatigue limits his ability to performing these tasks. Patient reports goal of walking dogs on outside uneven trails (07/09/2021);    Time 12    Period Weeks    Status Partially Met                   Plan - 07/22/21 1526     Clinical Impression Statement Patient tolerated treatment well overall but did report some discomfort in the right ankle during ladder activity but wanted to continue with it. Did demonstrate multiple LOB today, one requiring min A from PT to prevent fall when R ankle "gave way" while standing on airex pad. Continues to report improved comfort with ambulation at home. Patient would benefit from continued management of limiting condition by skilled physical therapist to address remaining impairments and functional limitations to work towards stated goals and return to PLOF or maximal functional independence.    Personal Factors and Comorbidities Age;Comorbidity 3+;Past/Current Experience;Fitness;Time since onset of injury/illness/exacerbation    Comorbidities Relevant past medical history and comorbidities include seizure disorder (controlled with meds), hx R ankle fracture (wears an air brace to prevent pain for years), ACDF (05/2018 for myelopathy), R  Carpal tunnel release  (06/2019), BPH, symptoms of peripheral neuropathy at all limbs (fine motor skills in B UE affected), CTS, perfuse degenerative changes on lumbar and cervical spine imaging (see chart for details).    Examination-Activity Limitations Bend;Locomotion Level;Stand;Carry;Sleep;Hygiene/Grooming;Caring for Others;Lift;Stairs;Squat    Examination-Participation Restrictions Shop;Community Activity;Meal Prep;Yard Work    [employer] Evolving/Moderate complexity    Rehab Potential Fair    PT Frequency 2x / week    PT Duration 12 weeks    PT Treatment/Interventions ADLs/Self Care Home Management;Cryotherapy;Traction;Moist Heat;Electrical Stimulation;DME Instruction;Gait training;Stair training;Functional mobility training;Therapeutic activities;Therapeutic exercise;Balance training;Neuromuscular re-education;Patient/family education;Passive range of motion;Dry needling;Manual techniques;Joint Manipulations;Spinal Manipulations    PT Next Visit Plan balance training, LE strength/power, R ankle strengthening as tolerated    PT Home Exercise Plan Medbridge Access Code: [email or web address]    Consulted and Agree with Plan of Care Patient             Patient will benefit from skilled therapeutic intervention in order to improve the following deficits and impairments:  Abnormal gait, Decreased knowledge of use of DME, Impaired sensation, Improper body mechanics, Pain, Decreased coordination, Decreased mobility, Postural dysfunction, Decreased activity tolerance, Decreased endurance, Decreased range of motion, Decreased strength, Hypomobility, Impaired perceived functional ability, Impaired UE functional use, Decreased balance, Difficulty walking, Impaired flexibility  Visit Diagnosis: Chronic bilateral low back pain, unspecified whether sciatica present  Unsteadiness on feet  History of falling  Difficulty in walking, not elsewhere classified     Problem List Patient Active Problem List    Diagnosis Date Noted   Hearing loss due to cerumen impaction 03/01/2021   Atypical chest pain 07/10/2020   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   Unsteadiness on feet 11/08/2019   History of spinal surgery 01/17/2019   Neck pain 01/17/2019   Weakness of hand 01/17/2019   Benign prostatic hyperplasia with urinary obstruction 17/91/5056   Systolic murmur 97/94/8016   Right carpal tunnel syndrome 02/02/2018  Ulnar neuropathy at elbow, left 02/02/2018   Peripheral neuropathy 01/26/2018   Bilateral buttock pain 06/29/2016   Failed vision screen 06/29/2016   Advanced care planning/counseling discussion 06/21/2015   Medicare annual wellness visit, subsequent 06/18/2014   Health maintenance examination 06/18/2014   Chronic ankle pain 06/18/2014   Incomplete emptying of bladder 06/07/2013   Vitamin D deficiency 05/20/2013   THYROID NODULE 05/17/2007   HLD (hyperlipidemia) 05/17/2007   TREMOR, ESSENTIAL 05/17/2007   Essential hypertension 05/17/2007   Cervical spondylosis with myelopathy and radiculopathy 05/17/2007   Seizure disorder ([place]) 05/17/2007    Everlean Alstrom. Graylon Good, PT, DPT 07/22/21, 3:30 PM   [facility] [address], [place], [address] Phone: [phone]   Fax:  [phone]  Name: DUKE WEISENSEL MRN: [id number] Date of Birth: 15-Jun-1935

## 2021-07-24 ENCOUNTER — Ambulatory Visit: Payer: Medicare Other | Admitting: Physical Therapy

## 2021-07-24 ENCOUNTER — Encounter: Payer: Self-pay | Admitting: Physical Therapy

## 2021-07-24 DIAGNOSIS — Z9181 History of falling: Secondary | ICD-10-CM

## 2021-07-24 DIAGNOSIS — M545 Low back pain, unspecified: Secondary | ICD-10-CM

## 2021-07-24 DIAGNOSIS — R2681 Unsteadiness on feet: Secondary | ICD-10-CM

## 2021-07-24 DIAGNOSIS — R262 Difficulty in walking, not elsewhere classified: Secondary | ICD-10-CM

## 2021-07-24 DIAGNOSIS — G8929 Other chronic pain: Secondary | ICD-10-CM

## 2021-07-24 NOTE — Therapy (Signed)
[facility] [address] [place], [address] Phone: [phone]   Fax:  [phone]  Physical Therapy Treatment  Patient Details  Name: Mark Holmes MRN: [id number] Date of Birth: 06/04/1935 Referring Provider (PT): Allene Dillon, NP   Encounter Date: 07/24/2021   PT End of Session - 07/24/21 1102     Visit Number 34    Number of Visits 44    Date for PT Re-Evaluation 08/21/21    Authorization Type [employer] MEDICARE reporting period from 07/09/2021    Progress Note Due on Visit 48    PT Start Time 0945    PT Stop Time 1030    PT Time Calculation (min) 45 min    Equipment Utilized During Treatment Gait belt    Activity Tolerance Patient tolerated treatment well    Behavior During Therapy [facility] for tasks assessed/performed             Past Medical History:  Diagnosis Date   Benign prostatic hypertrophy with elevated PSA    per Dr. Jacqlyn Larsen   History of CT scan of brain 1992   normal   Hyperglycemia 01/2006   102   Hyperlipemia    Hypertension    Seizure disorder [facility])     Past Surgical History:  Procedure Laterality Date   ANTERIOR CERVICAL DECOMP/DISCECTOMY FUSION  05/2018   for myelopathy -C3/4 ([facility])   CARPAL TUNNEL RELEASE Right 06/2019   ([facility])   CATARACT EXTRACTION W/ INTRAOCULAR LENS IMPLANT Right 2012   [facility]   History of EEG  1985   normal   hospitalization  1981   observation for seizuare   TONSILLECTOMY     TRANSURETHRAL RESECTION OF PROSTATE  10/2005   [place] RESECTION OF PROSTATE  08/2017   (rpt) [place]    There were no vitals filed for this visit.   Subjective Assessment - 07/24/21 0952     Subjective Patient reports he is feeling well today with no falls since last PT session. He has the usual discomfort in his lower legs. Arrives carrying [facility] and wearing air cast on R ankle. States he felt fine after last PT session.     Pertinent History Patient is a 86 y.o. male who presents to outpatient physical therapy with a referral for medical diagnosis difficulty balancing, lumbar DDD (degenerative disc disease) with request for balance therapy. This patient's chief complaints consist of discomfort of the B LE below the knee, pain in the low back with standing, and difficulty balancing in the setting of chronic R ankle pain, B hand neuropathy symptoms and history of cervical spine disorder with ACDF at C3-4 for myelopathy leading to the following functional deficits: cannot do what he wants to outside because he is afraid to fall, taking the puppy out, prolonged standing, household chores, community ambulation, grooming, climbing stairs, yard work, sleeping, fall risk, etc. .  Relevant past medical history and comorbidities include seizure disorder (controlled with meds), hx R ankle fracture (wears an air brace to prevent pain for years), ACDF (05/2018 for myelopathy), R  Carpal tunnel release (06/2019), BPH, symptoms of peripheral neuropathy at all limbs (fine motor skills in B UE affected), CTS, perfuse degenerative changes on lumbar and cervical spine imaging (see chart for details).    Limitations Standing;Walking;Sitting;Other (comment);House hold activities    How long can you sit comfortably? "a long time' (but will feel it when he stands up)  How long can you stand comfortably? can finish the task but gets really uncomfortable in the back    Diagnostic tests Cervical spine MRI report 11/29/2020: " IMPRESSION:  1. Neurofibroma of the left C2 nerve as seen previously, maximal  transverse diameter 11-12 mm.  2. Previous ACDF C3-4. Canal stenosis remains at this level with AP  diameter of 7 mm, without worsening. No actual cord compression.  3. Spondylosis and facet arthropathy with degenerative  anterolisthesis of C5-6 and C7-T1. Canal narrowing but no cord  compression.  4. Foraminal stenosis primarily due to facet osteophytes and   uncovertebral osteophytes that could cause neural compression  bilateral at C2-3, bilateral at C3-4, bilateral at C4-5, bilateral  but left more than right at C5-6, bilateral but left more than right  at C6-7 and bilateral but left more than right at C7-T1." Lumbar spine MRI report 11/29/2020: "IMPRESSION:  1. Degenerative disc disease and degenerative facet disease  throughout the lumbar region which could be associated with back  pain. Thoracolumbar scoliotic curvature.  2. L2-3: Moderate central canal stenosis. Bilateral lateral recess  and foraminal stenosis left worse than right. Neural compression  could occur at this level, particularly on the left.  3. L3-4: Bilateral lateral recess and foraminal stenosis, right  worse than left. Neural compression could occur on either side,  particularly on the right.  4. L4-5: Bilateral lateral recess and foraminal stenosis, right more  than left. Neural compression could occur on either side,  particularly the right.  5. L5-S1: Bilateral foraminal narrowing with some potential to  affect either exiting L5 nerve."    Patient Stated Goals he would like to walk "like normal."    Currently in Pain? No/denies             TREATMENT: Shoes/R ankle brace doffed until exiting clinic.   Manual Therapy: to increase joint mobility of the ankle to support increased overall ROM and provide relief to ankle musculature. SUPINE POSITION: R ankle - Supine subtalar medial glide in order to increase eversion of the R ankle, Grades III-IV, 2x30 seconds.  - Supine subtalar lateral glide in order to increase inversion of the R ankle, Grades III-IV, 2x30 seconds. - Supine talocural distraction to R ankle, 2x30 seconds to increase overall ROM  - Supine talocrural posterior and anterior glide, grade III-IV, 2x30 seconds each way   Neuromuscular Re-education: to improve, balance, postural strength, muscle activation patterns, and stabilization strength required for functional  activities (CGA - min A as needed for safety): - ball toss on airex pad, pink theraball at rebounder, 1x30-33 each direction forward and each side. CGA- min A for safety.  - agility ladder drill: forward/backwards steps with both feet into each square, 2x length of ladder each direction with CGA for safety. Improved carry over for patterning but increased difficulty today with ataxia.    Therapeutic exercise: to centralize symptoms and improve ROM, strength, muscular endurance, and activity tolerance required for successful completion of functional activities.  - runner's step up to 12 inch stool with U UE support and CGA, 2x10 each side focusing on pushing off/eccentric lower with R foot on the floor and strong hip/knee extension from foot on step (especially right). CGA for safety. - seated review of banded ankle exercises at home: R ankle eversion with yellow -> blue theraband, R ankle PF with black then silver theraband, B heel raise with inversion against red band around ankles. - Education on HEP including handout, blue theraband, shorter sliver  theraband   Pt required multimodal cuing for proper technique and to facilitate improved neuromuscular control, strength, range of motion, and functional ability resulting in improved performance and form. (SBA - min A for standing activities due to risk of occasional giving way of B LE).   HOME EXERCISE PROGRAM Access Code: [email or web address] URL: [email or web address] Date: 07/24/2021 Prepared by: Rosita Kea  Exercises Sit to Stand with Arm Reach and Jump - 2-3 x weekly - 3 sets - 10 reps Half Tandem Stance Balance with Head Rotation - 2-3 x weekly - 1 sets - 20 reps Seated Ankle Plantar Flexion with Resistance Loop - 1 x daily - 3 sets - 10 reps Seated Ankle Eversion with Resistance - 1 x daily - 3 sets - 10 reps Straight Leg Raise Sciatic Nerve Flossing - 1 x daily - 7 x weekly - 3 sets - 10 reps - 1 hold Lateral Inside Outside with  Agility Ladder Forward Shuffle- Two Feet In, Two Feet Out with Agility Ladder   PT Education - 07/24/21 1102     Education Details intervention/exercise purpose/form.    Person(s) Educated Patient    Methods Explanation;Demonstration;Tactile cues;Verbal cues    Comprehension Verbalized understanding;Returned demonstration;Verbal cues required;Tactile cues required;Need further instruction              PT Short Term Goals - 04/15/21 1000       PT SHORT TERM GOAL #1   Title Be independent with initial home exercise program for self-management of symptoms.    Baseline Initial HEP to be established at visit 2 as appropriate (03/13/2021); Initial HEP established visit 2 (03/18/2021);    Time 3    Period Weeks    Status Achieved    Target Date 04/03/21               PT Long Term Goals - 07/09/21 1158       PT LONG TERM GOAL #1   Title Be independent with a long-term home exercise program for self-management of symptoms.    Baseline Initial HEP to be established at visit 2 as appropriate (03/13/2021); Initial HEP provided visit 2 (03/18/2021); continues to participate in appropriate HEP (05/29/2021); continues to participate in HEP (07/09/2021);    Time 12    Period Weeks    Status Partially Met   TARGET DATE FOR ALL LONG TERM GOALS: 06/01/2021, updated to 08/21/2021 for unmet goals     PT LONG TERM GOAL #2   Title Demonstrate improved FOTO score by 10 units to demonstrate improvement in overall condition and self-reported functional ability.    Baseline to be measured visit 2 as appropriate (03/13/2021); 51 (03/18/2021); 51 (04/15/21): 54 (05/29/2021); 58 (07/09/2021);    Time 12    Period Weeks    Status Partially Met      PT LONG TERM GOAL #3   Title Patient will score equal or greater than 25/30 on Functional Gait Assessment to demonstrate reduction in fall risk to low fall risk classification.    Baseline to be measured vsiti 2 as appropriate (03/13/2021); 20/30 moderate fall risk  (03/18/2021); 23/30 (05/29/2021); 22/30 (07/09/2021):    Time 12    Period Weeks    Status On-going      PT LONG TERM GOAL #4   Title Patient will ambulate equal or greater than 1000 feet during 6 minute walk test to demonstrate improved community mobility.    Baseline to be measured vsiti 2 as appropriate (03/13/2021); 820 feet with SPC(03/18/2021); 1258f,  4/10 back pain peak that resolves upon rest (04/15/21); 1000 feet with increasing B SIJ pain (05/29/2021); 1100 feet with increasing B low back and R ankle pain (07/09/2021);    Time 12    Period Weeks    Status Achieved      PT LONG TERM GOAL #5   Title Complete community, work and/or recreational activities with 50% less limitation due to current condition.    Baseline Functional Limitations: difficulty with activities that require standing balance including outside activities, taking the puppy out, prolonged standing, household chores, community ambulation, grooming, climbing stairs, yard work, sleeping, fall risk, etc. He states he cannot do what he wants to outside because he is afraid to fall. He can do what he wants down on his knees, but not standing up. He has a new puppy and it is hard to keep his balance when she is pulling him (03/13/2021); states the dog doesn't pull him off balance any more and he was able to trim trees with the extended trimmer, back pain in the morning is better (05/08/2021); Patient reports difficulty with prolonged walking and going upstairs. States that his legs fatigue limits his ability to performing these tasks. Patient reports goal of walking dogs on outside uneven trails (07/09/2021);    Time 12    Period Weeks    Status Partially Met                   Plan - 07/24/21 1101     Clinical Impression Statement Patient tolerated treatment with some difficulty today due to increased ataxia and feeling of B knees and R ankle buckling that affected his balance and coordination. Patient demonstrated improved  carry over for sequencing for agility ladder and step up task but required CGA- min A for support to prevent fall. Demonstrated effective stepping strategy several times. Patient would benefit from continued management of limiting condition by skilled physical therapist to address remaining impairments and functional limitations to work towards stated goals and return to PLOF or maximal functional independence.    Personal Factors and Comorbidities Age;Comorbidity 3+;Past/Current Experience;Fitness;Time since onset of injury/illness/exacerbation    Comorbidities Relevant past medical history and comorbidities include seizure disorder (controlled with meds), hx R ankle fracture (wears an air brace to prevent pain for years), ACDF (05/2018 for myelopathy), R  Carpal tunnel release (06/2019), BPH, symptoms of peripheral neuropathy at all limbs (fine motor skills in B UE affected), CTS, perfuse degenerative changes on lumbar and cervical spine imaging (see chart for details).    Examination-Activity Limitations Bend;Locomotion Level;Stand;Carry;Sleep;Hygiene/Grooming;Caring for Others;Lift;Stairs;Squat    Examination-Participation Restrictions Shop;Community Activity;Meal Prep;Yard Work    [employer] Evolving/Moderate complexity    Rehab Potential Fair    PT Frequency 2x / week    PT Duration 12 weeks    PT Treatment/Interventions ADLs/Self Care Home Management;Cryotherapy;Traction;Moist Heat;Electrical Stimulation;DME Instruction;Gait training;Stair training;Functional mobility training;Therapeutic activities;Therapeutic exercise;Balance training;Neuromuscular re-education;Patient/family education;Passive range of motion;Dry needling;Manual techniques;Joint Manipulations;Spinal Manipulations    PT Next Visit Plan balance training, LE strength/power, R ankle strengthening as tolerated    PT Home Exercise Plan Medbridge Access Code: [email or web address]    Consulted and Agree with Plan of Care  Patient             Patient will benefit from skilled therapeutic intervention in order to improve the following deficits and impairments:  Abnormal gait, Decreased knowledge of use of DME, Impaired sensation, Improper body mechanics, Pain, Decreased coordination, Decreased mobility, Postural dysfunction, Decreased activity tolerance, Decreased  endurance, Decreased range of motion, Decreased strength, Hypomobility, Impaired perceived functional ability, Impaired UE functional use, Decreased balance, Difficulty walking, Impaired flexibility  Visit Diagnosis: Chronic bilateral low back pain, unspecified whether sciatica present  Unsteadiness on feet  History of falling  Difficulty in walking, not elsewhere classified     Problem List Patient Active Problem List   Diagnosis Date Noted   Hearing loss due to cerumen impaction 03/01/2021   Atypical chest pain 07/10/2020   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   Unsteadiness on feet 11/08/2019   History of spinal surgery 01/17/2019   Neck pain 01/17/2019   Weakness of hand 01/17/2019   Benign prostatic hyperplasia with urinary obstruction 74/11/8785   Systolic murmur 76/72/0947   Right carpal tunnel syndrome 02/02/2018   Ulnar neuropathy at elbow, left 02/02/2018   Peripheral neuropathy 01/26/2018   Bilateral buttock pain 06/29/2016   Failed vision screen 06/29/2016   Advanced care planning/counseling discussion 06/21/2015   Medicare annual wellness visit, subsequent 06/18/2014   Health maintenance examination 06/18/2014   Chronic ankle pain 06/18/2014   Incomplete emptying of bladder 06/07/2013   Vitamin D deficiency 05/20/2013   THYROID NODULE 05/17/2007   HLD (hyperlipidemia) 05/17/2007   TREMOR, ESSENTIAL 05/17/2007   Essential hypertension 05/17/2007   Cervical spondylosis with myelopathy and radiculopathy 05/17/2007   Seizure disorder ([place]) 05/17/2007   Everlean Alstrom. Graylon Good, PT, DPT 07/24/21, 11:08 AM  [facility] [address] [place], [address] Phone: [phone]   Fax:  [phone]  Name: DONTREZ PETTIS MRN: [id number] Date of Birth: 1935/11/11

## 2021-07-28 ENCOUNTER — Ambulatory Visit: Payer: Medicare Other | Admitting: Physical Therapy

## 2021-07-28 ENCOUNTER — Encounter: Payer: Self-pay | Admitting: Physical Therapy

## 2021-07-28 DIAGNOSIS — R262 Difficulty in walking, not elsewhere classified: Secondary | ICD-10-CM

## 2021-07-28 DIAGNOSIS — M545 Low back pain, unspecified: Secondary | ICD-10-CM

## 2021-07-28 DIAGNOSIS — Z9181 History of falling: Secondary | ICD-10-CM

## 2021-07-28 DIAGNOSIS — R2681 Unsteadiness on feet: Secondary | ICD-10-CM

## 2021-07-28 NOTE — Therapy (Signed)
[facility] [address], [place], [address] Phone: [phone]   Fax:  [phone]  Physical Therapy Treatment  Patient Details  Name: Mark Holmes MRN: [id number] Date of Birth: 1935-12-03 Referring Provider (PT): Allene Dillon, NP   Encounter Date: 07/28/2021   PT End of Session - 07/28/21 0909     Visit Number 35    Number of Visits 44    Date for PT Re-Evaluation 08/21/21    Authorization Type [employer] MEDICARE reporting period from 07/09/2021    Progress Note Due on Visit 6    PT Start Time 0900    PT Stop Time 0940    PT Time Calculation (min) 40 min    Equipment Utilized During Treatment Gait belt    Activity Tolerance Patient tolerated treatment well    Behavior During Therapy [facility] for tasks assessed/performed             Past Medical History:  Diagnosis Date   Benign prostatic hypertrophy with elevated PSA    per Dr. Jacqlyn Larsen   History of CT scan of brain 1992   normal   Hyperglycemia 01/2006   102   Hyperlipemia    Hypertension    Seizure disorder [facility])     Past Surgical History:  Procedure Laterality Date   ANTERIOR CERVICAL DECOMP/DISCECTOMY FUSION  05/2018   for myelopathy -C3/4 ([facility])   CARPAL TUNNEL RELEASE Right 06/2019   ([facility])   CATARACT EXTRACTION W/ INTRAOCULAR LENS IMPLANT Right 2012   [place] [facility]   History of EEG  1985   normal   hospitalization  1981   observation for seizuare   TONSILLECTOMY     TRANSURETHRAL RESECTION OF PROSTATE  10/2005   [place] RESECTION OF PROSTATE  08/2017   (rpt) [place]    There were no vitals filed for this visit.   Subjective Assessment - 07/28/21 0907     Subjective Pateint reports he is feeling well today. He still gets a tight feeling in his bilateral LE but reports no pain upon arrival. [place] he did fall in in the front lawn without injury since last PT session. The dog ran by bast and he  turned too quickly without his legs spread wide enough and he fell. He was able to get up on his own .    Pertinent History Patient is a 86 y.o. male who presents to outpatient physical therapy with a referral for medical diagnosis difficulty balancing, lumbar DDD (degenerative disc disease) with request for balance therapy. This patient's chief complaints consist of discomfort of the B LE below the knee, pain in the low back with standing, and difficulty balancing in the setting of chronic R ankle pain, B hand neuropathy symptoms and history of cervical spine disorder with ACDF at C3-4 for myelopathy leading to the following functional deficits: cannot do what he wants to outside because he is afraid to fall, taking the puppy out, prolonged standing, household chores, community ambulation, grooming, climbing stairs, yard work, sleeping, fall risk, etc. .  Relevant past medical history and comorbidities include seizure disorder (controlled with meds), hx R ankle fracture (wears an air brace to prevent pain for years), ACDF (05/2018 for myelopathy), R  Carpal tunnel release (06/2019), BPH, symptoms of peripheral neuropathy at all limbs (fine motor skills in B UE affected), CTS, perfuse degenerative changes on lumbar and cervical spine imaging (see chart for details).    Limitations  Standing;Walking;Sitting;Other (comment);House hold activities    How long can you sit comfortably? "a long time' (but will feel it when he stands up)    How long can you stand comfortably? can finish the task but gets really uncomfortable in the back    Diagnostic tests Cervical spine MRI report 11/29/2020: " IMPRESSION:  1. Neurofibroma of the left C2 nerve as seen previously, maximal  transverse diameter 11-12 mm.  2. Previous ACDF C3-4. Canal stenosis remains at this level with AP  diameter of 7 mm, without worsening. No actual cord compression.  3. Spondylosis and facet arthropathy with degenerative  anterolisthesis of C5-6 and  C7-T1. Canal narrowing but no cord  compression.  4. Foraminal stenosis primarily due to facet osteophytes and  uncovertebral osteophytes that could cause neural compression  bilateral at C2-3, bilateral at C3-4, bilateral at C4-5, bilateral  but left more than right at C5-6, bilateral but left more than right  at C6-7 and bilateral but left more than right at C7-T1." Lumbar spine MRI report 11/29/2020: "IMPRESSION:  1. Degenerative disc disease and degenerative facet disease  throughout the lumbar region which could be associated with back  pain. Thoracolumbar scoliotic curvature.  2. L2-3: Moderate central canal stenosis. Bilateral lateral recess  and foraminal stenosis left worse than right. Neural compression  could occur at this level, particularly on the left.  3. L3-4: Bilateral lateral recess and foraminal stenosis, right  worse than left. Neural compression could occur on either side,  particularly on the right.  4. L4-5: Bilateral lateral recess and foraminal stenosis, right more  than left. Neural compression could occur on either side,  particularly the right.  5. L5-S1: Bilateral foraminal narrowing with some potential to  affect either exiting L5 nerve."    Patient Stated Goals he would like to walk "like normal."    Currently in Pain? No/denies              TREATMENT: Shoes/R ankle brace doffed until exiting clinic.   Manual Therapy: to increase joint mobility of the ankle to support increased overall ROM and provide relief to ankle musculature. SUPINE POSITION: R ankle - Supine subtalar medial glide in order to increase eversion of the R ankle, Grades III-IV, 2x30 seconds.  - Supine subtalar lateral glide in order to increase inversion of the R ankle, Grades III-IV, 2x30 seconds. - Supine talocural distraction to R ankle, 2x30 seconds to increase overall ROM  - Supine talocrural posterior and anterior glide, grade III-IV, 2x30 seconds each way   Neuromuscular Re-education: to  improve, balance, postural strength, muscle activation patterns, and stabilization strength required for functional activities (CGA - min A as needed for safety): - 6 inch hurdles (8 hurdles): forward step-to x 2x8 hurdles, side stepping 2x8 each way. CGA-min A to prevent falls. One instance of B LE buckle that required mod A to prevent fall.  - backwards walking 4x35 feet with CGA-min A to prevent falls - forward sway on a2z pad with BUE touch down support as needed, 1x13. CGA for safety.    Therapeutic exercise: to centralize symptoms and improve ROM, strength, muscular endurance, and activity tolerance required for successful completion of functional activities.  - runner's step up to 12 inch stool with U UE support and CGA, 2x10 each side focusing on pushing off/eccentric lower with R foot on the floor and strong hip/knee extension from foot on step (especially right). CGA for safety.   Pt required multimodal cuing for proper technique and  to facilitate improved neuromuscular control, strength, range of motion, and functional ability resulting in improved performance and form. (SBA - min A for standing activities due to risk of occasional giving way of B LE).    HOME EXERCISE PROGRAM Access Code: [email or web address] URL: [email or web address] Date: 07/24/2021 Prepared by: Rosita Kea   Exercises Sit to Stand with Arm Reach and Jump - 2-3 x weekly - 3 sets - 10 reps Half Tandem Stance Balance with Head Rotation - 2-3 x weekly - 1 sets - 20 reps Seated Ankle Plantar Flexion with Resistance Loop - 1 x daily - 3 sets - 10 reps Seated Ankle Eversion with Resistance - 1 x daily - 3 sets - 10 reps Straight Leg Raise Sciatic Nerve Flossing - 1 x daily - 7 x weekly - 3 sets - 10 reps - 1 hold Lateral Inside Outside with Agility Ladder Forward Shuffle- Two Feet In, Two Feet Out with Agility Ladder   PT Education - 07/28/21 0909     Education Details intervention/exercise purpose/form.     Person(s) Educated Patient    Methods Explanation;Demonstration;Tactile cues;Verbal cues    Comprehension Verbalized understanding;Returned demonstration;Verbal cues required;Need further instruction              PT Short Term Goals - 04/15/21 1000       PT SHORT TERM GOAL #1   Title Be independent with initial home exercise program for self-management of symptoms.    Baseline Initial HEP to be established at visit 2 as appropriate (03/13/2021); Initial HEP established visit 2 (03/18/2021);    Time 3    Period Weeks    Status Achieved    Target Date 04/03/21               PT Long Term Goals - 07/09/21 1158       PT LONG TERM GOAL #1   Title Be independent with a long-term home exercise program for self-management of symptoms.    Baseline Initial HEP to be established at visit 2 as appropriate (03/13/2021); Initial HEP provided visit 2 (03/18/2021); continues to participate in appropriate HEP (05/29/2021); continues to participate in HEP (07/09/2021);    Time 12    Period Weeks    Status Partially Met   TARGET DATE FOR ALL LONG TERM GOALS: 06/01/2021, updated to 08/21/2021 for unmet goals     PT LONG TERM GOAL #2   Title Demonstrate improved FOTO score by 10 units to demonstrate improvement in overall condition and self-reported functional ability.    Baseline to be measured visit 2 as appropriate (03/13/2021); 51 (03/18/2021); 51 (04/15/21): 54 (05/29/2021); 58 (07/09/2021);    Time 12    Period Weeks    Status Partially Met      PT LONG TERM GOAL #3   Title Patient will score equal or greater than 25/30 on Functional Gait Assessment to demonstrate reduction in fall risk to low fall risk classification.    Baseline to be measured vsiti 2 as appropriate (03/13/2021); 20/30 moderate fall risk (03/18/2021); 23/30 (05/29/2021); 22/30 (07/09/2021):    Time 12    Period Weeks    Status On-going      PT LONG TERM GOAL #4   Title Patient will ambulate equal or greater than 1000 feet during 6  minute walk test to demonstrate improved community mobility.    Baseline to be measured vsiti 2 as appropriate (03/13/2021); 820 feet with SPC(03/18/2021); 1232f, 4/10 back pain peak that resolves upon rest (04/15/21); 1000 feet  with increasing B SIJ pain (05/29/2021); 1100 feet with increasing B low back and R ankle pain (07/09/2021);    Time 12    Period Weeks    Status Achieved      PT LONG TERM GOAL #5   Title Complete community, work and/or recreational activities with 50% less limitation due to current condition.    Baseline Functional Limitations: difficulty with activities that require standing balance including outside activities, taking the puppy out, prolonged standing, household chores, community ambulation, grooming, climbing stairs, yard work, sleeping, fall risk, etc. He states he cannot do what he wants to outside because he is afraid to fall. He can do what he wants down on his knees, but not standing up. He has a new puppy and it is hard to keep his balance when she is pulling him (03/13/2021); states the dog doesn't pull him off balance any more and he was able to trim trees with the extended trimmer, back pain in the morning is better (05/08/2021); Patient reports difficulty with prolonged walking and going upstairs. States that his legs fatigue limits his ability to performing these tasks. Patient reports goal of walking dogs on outside uneven trails (07/09/2021);    Time 12    Period Weeks    Status Partially Met                   Plan - 07/28/21 0943     Clinical Impression Statement Patient tolerated treatment well overall but continues to struggle with LE stability and required min-modA today for knees buckling or unsteadiness/spacticity at the LE during therapeutic interventions. Continues to report unsteadiness on feet that decreases his functional mobility and increases fall risk. Patient would benefit from continued management of limiting condition by skilled physical  therapist to address remaining impairments and functional limitations to work towards stated goals and return to PLOF or maximal functional independence.    Personal Factors and Comorbidities Age;Comorbidity 3+;Past/Current Experience;Fitness;Time since onset of injury/illness/exacerbation    Comorbidities Relevant past medical history and comorbidities include seizure disorder (controlled with meds), hx R ankle fracture (wears an air brace to prevent pain for years), ACDF (05/2018 for myelopathy), R  Carpal tunnel release (06/2019), BPH, symptoms of peripheral neuropathy at all limbs (fine motor skills in B UE affected), CTS, perfuse degenerative changes on lumbar and cervical spine imaging (see chart for details).    Examination-Activity Limitations Bend;Locomotion Level;Stand;Carry;Sleep;Hygiene/Grooming;Caring for Others;Lift;Stairs;Squat    Examination-Participation Restrictions Shop;Community Activity;Meal Prep;Yard Work    [employer] Evolving/Moderate complexity    Rehab Potential Fair    PT Frequency 2x / week    PT Duration 12 weeks    PT Treatment/Interventions ADLs/Self Care Home Management;Cryotherapy;Traction;Moist Heat;Electrical Stimulation;DME Instruction;Gait training;Stair training;Functional mobility training;Therapeutic activities;Therapeutic exercise;Balance training;Neuromuscular re-education;Patient/family education;Passive range of motion;Dry needling;Manual techniques;Joint Manipulations;Spinal Manipulations    PT Next Visit Plan balance training, LE strength/power, R ankle strengthening as tolerated    PT Home Exercise Plan Medbridge Access Code: [email or web address]    Consulted and Agree with Plan of Care Patient             Patient will benefit from skilled therapeutic intervention in order to improve the following deficits and impairments:  Abnormal gait, Decreased knowledge of use of DME, Impaired sensation, Improper body mechanics, Pain, Decreased  coordination, Decreased mobility, Postural dysfunction, Decreased activity tolerance, Decreased endurance, Decreased range of motion, Decreased strength, Hypomobility, Impaired perceived functional ability, Impaired UE functional use, Decreased balance, Difficulty walking, Impaired flexibility  Visit Diagnosis:  Chronic bilateral low back pain, unspecified whether sciatica present  Unsteadiness on feet  History of falling  Difficulty in walking, not elsewhere classified     Problem List Patient Active Problem List   Diagnosis Date Noted   Hearing loss due to cerumen impaction 03/01/2021   Atypical chest pain 07/10/2020   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   Unsteadiness on feet 11/08/2019   History of spinal surgery 01/17/2019   Neck pain 01/17/2019   Weakness of hand 01/17/2019   Benign prostatic hyperplasia with urinary obstruction 19/91/4445   Systolic murmur 84/83/5075   Right carpal tunnel syndrome 02/02/2018   Ulnar neuropathy at elbow, left 02/02/2018   Peripheral neuropathy 01/26/2018   Bilateral buttock pain 06/29/2016   Failed vision screen 06/29/2016   Advanced care planning/counseling discussion 06/21/2015   Medicare annual wellness visit, subsequent 06/18/2014   Health maintenance examination 06/18/2014   Chronic ankle pain 06/18/2014   Incomplete emptying of bladder 06/07/2013   Vitamin D deficiency 05/20/2013   THYROID NODULE 05/17/2007   HLD (hyperlipidemia) 05/17/2007   TREMOR, ESSENTIAL 05/17/2007   Essential hypertension 05/17/2007   Cervical spondylosis with myelopathy and radiculopathy 05/17/2007   Seizure disorder ([place]) 05/17/2007    Everlean Alstrom. Graylon Good, PT, DPT 07/28/21, 10:27 AM   [facility] [address] [place], [address] Phone: [phone]   Fax:  [phone]  Name: FERNIE GRIMM MRN: [id number] Date of Birth: 1935/10/22

## 2021-07-30 ENCOUNTER — Encounter: Payer: Self-pay | Admitting: Physical Therapy

## 2021-07-30 ENCOUNTER — Ambulatory Visit: Payer: Medicare Other | Admitting: Physical Therapy

## 2021-07-30 DIAGNOSIS — G8929 Other chronic pain: Secondary | ICD-10-CM

## 2021-07-30 DIAGNOSIS — Z9181 History of falling: Secondary | ICD-10-CM

## 2021-07-30 DIAGNOSIS — R262 Difficulty in walking, not elsewhere classified: Secondary | ICD-10-CM

## 2021-07-30 DIAGNOSIS — R2681 Unsteadiness on feet: Secondary | ICD-10-CM

## 2021-07-30 DIAGNOSIS — M545 Low back pain, unspecified: Secondary | ICD-10-CM | POA: Diagnosis not present

## 2021-07-30 NOTE — Therapy (Signed)
[facility] [address], [place], [address] Phone: [phone]   Fax:  [phone]  Physical Therapy Treatment  Patient Details  Name: Mark Holmes MRN: [id number] Date of Birth: 1935-08-30 Referring Provider (PT): Allene Dillon, NP   Encounter Date: 07/30/2021   PT End of Session - 07/30/21 1133     Visit Number 36    Number of Visits 44    Date for PT Re-Evaluation 08/21/21    Authorization Type [employer] MEDICARE reporting period from 07/09/2021    Progress Note Due on Visit 56    PT Start Time 0950    PT Stop Time 1030    PT Time Calculation (min) 40 min    Equipment Utilized During Treatment --    Activity Tolerance Patient tolerated treatment well    Behavior During Therapy [facility] for tasks assessed/performed             Past Medical History:  Diagnosis Date   Benign prostatic hypertrophy with elevated PSA    per Dr. Jacqlyn Larsen   History of CT scan of brain 1992   normal   Hyperglycemia 01/2006   102   Hyperlipemia    Hypertension    Seizure disorder [facility])     Past Surgical History:  Procedure Laterality Date   ANTERIOR CERVICAL DECOMP/DISCECTOMY FUSION  05/2018   for myelopathy -C3/4 ([facility])   CARPAL TUNNEL RELEASE Right 06/2019   ([facility])   CATARACT EXTRACTION W/ INTRAOCULAR LENS IMPLANT Right 2012   [place] [facility]   History of EEG  1985   normal   hospitalization  1981   observation for seizuare   TONSILLECTOMY     TRANSURETHRAL RESECTION OF PROSTATE  10/2005   [place] RESECTION OF PROSTATE  08/2017   (rpt) [place]    There were no vitals filed for this visit.   Subjective Assessment - 07/30/21 1130     Subjective Patient reports he is feeling well today and has not had any falls since last PT session. Arrives carrying [facility] with air cast on R ankle. State he notices he continues to have discomfort and sensation of tightness in his B lower legs with  prolonged sitting but this gets better when he get up and walks periodically. Wonders if there is something he can do about this. Also states some of the band exercises seem too easy. he would like re-prints of all of his HEP prior to discharge.    Pertinent History Patient is a 86 y.o. male who presents to outpatient physical therapy with a referral for medical diagnosis difficulty balancing, lumbar DDD (degenerative disc disease) with request for balance therapy. This patient's chief complaints consist of discomfort of the B LE below the knee, pain in the low back with standing, and difficulty balancing in the setting of chronic R ankle pain, B hand neuropathy symptoms and history of cervical spine disorder with ACDF at C3-4 for myelopathy leading to the following functional deficits: cannot do what he wants to outside because he is afraid to fall, taking the puppy out, prolonged standing, household chores, community ambulation, grooming, climbing stairs, yard work, sleeping, fall risk, etc. .  Relevant past medical history and comorbidities include seizure disorder (controlled with meds), hx R ankle fracture (wears an air brace to prevent pain for years), ACDF (05/2018 for myelopathy), R  Carpal tunnel release (06/2019), BPH, symptoms of peripheral neuropathy at all limbs (fine motor skills in  B UE affected), CTS, perfuse degenerative changes on lumbar and cervical spine imaging (see chart for details).    Limitations Standing;Walking;Sitting;Other (comment);House hold activities    How long can you sit comfortably? "a long time' (but will feel it when he stands up)    How long can you stand comfortably? can finish the task but gets really uncomfortable in the back    Diagnostic tests Cervical spine MRI report 11/29/2020: " IMPRESSION:  1. Neurofibroma of the left C2 nerve as seen previously, maximal  transverse diameter 11-12 mm.  2. Previous ACDF C3-4. Canal stenosis remains at this level with AP  diameter  of 7 mm, without worsening. No actual cord compression.  3. Spondylosis and facet arthropathy with degenerative  anterolisthesis of C5-6 and C7-T1. Canal narrowing but no cord  compression.  4. Foraminal stenosis primarily due to facet osteophytes and  uncovertebral osteophytes that could cause neural compression  bilateral at C2-3, bilateral at C3-4, bilateral at C4-5, bilateral  but left more than right at C5-6, bilateral but left more than right  at C6-7 and bilateral but left more than right at C7-T1." Lumbar spine MRI report 11/29/2020: "IMPRESSION:  1. Degenerative disc disease and degenerative facet disease  throughout the lumbar region which could be associated with back  pain. Thoracolumbar scoliotic curvature.  2. L2-3: Moderate central canal stenosis. Bilateral lateral recess  and foraminal stenosis left worse than right. Neural compression  could occur at this level, particularly on the left.  3. L3-4: Bilateral lateral recess and foraminal stenosis, right  worse than left. Neural compression could occur on either side,  particularly on the right.  4. L4-5: Bilateral lateral recess and foraminal stenosis, right more  than left. Neural compression could occur on either side,  particularly the right.  5. L5-S1: Bilateral foraminal narrowing with some potential to  affect either exiting L5 nerve."    Patient Stated Goals he would like to walk "like normal."    Currently in Pain? No/denies            TREATMENT: Shoes/R ankle brace doffed until exiting clinic.   Manual Therapy: to increase joint mobility of the ankle to support increased overall ROM and provide relief to ankle musculature. SUPINE POSITION: R ankle - Supine subtalar medial glide in order to increase eversion of the R ankle, Grades III-IV, 2x30 seconds.  - Supine subtalar lateral glide in order to increase inversion of the R ankle, Grades III-IV, 2x30 seconds. - Supine talocural distraction to R ankle, 2x30 seconds to increase  overall ROM  - Supine talocrural posterior and anterior glide, grade III-IV, 2x30 seconds each way   Neuromuscular Re-education: to improve, balance, postural strength, muscle activation patterns, and stabilization strength required for functional activities (CGA - min A as needed for safety): - 6 inch hurdles (8 hurdles): forward step-to x 2x8 hurdles, side stepping 2x8 each way. CGA-min A to prevent falls. One instance of B LE buckle that required mod A to prevent fall. - backwards walking 4x35 feet with CGA-min A to prevent falls - forward sway on a2z pad with BUE touch down support as needed, 1x13. CGA for safety.    Therapeutic exercise: to centralize symptoms and improve ROM, strength, muscular endurance, and activity tolerance required for successful completion of functional activities.  - supine B ankle inversion isometrics against small ball, 1x20, 5 second hold - seated towel slides into inversion/eversion, 3x10 each side (tried with 3# DB on towel but too hard in inversion).  -  R ankle circles in figure 4 position, 3x20 each direction - seated R ankle PF stretch with to plantarflexed under chair (touching dorsal aspect of toes to the ground), 3x30 seconds - review of PF exercise with band - Attempt to ambulate on tip toes (unable to keep R ankle from collapsing).  - standing LE weight shifts with B heels lifted, alternating knee flexion to take pressure off that foot and shift more weight to contralateral foot. 2x20 (with improving understanding of exercise with heavy cuing, patient proposes name to include "moonwalks").  - standing R gastroc stretch, 3x30 seconds.  - Education on HEP including handout   Pt required multimodal cuing for proper technique and to facilitate improved neuromuscular control, strength, range of motion, and functional ability resulting in improved performance and form.    HOME EXERCISE PROGRAM Access Code: [email or web address] URL:  [email or web address] Date: 07/30/2021 Prepared by: Rosita Kea  Exercises Sit to Stand with Arm Reach and Jump - 2-3 x weekly - 3 sets - 10 reps Half Tandem Stance Balance with Head Rotation - 2-3 x weekly - 1 sets - 20 reps Seated Ankle Plantar Flexion with Resistance Loop - 1 x daily - 3 sets - 10 reps Seated Ankle Eversion with Resistance - 1 x daily - 3 sets - 10 reps Straight Leg Raise Sciatic Nerve Flossing - 1 x daily - 7 x weekly - 3 sets - 10 reps - 1 hold Lateral Inside Outside with Agility Ladder Forward Shuffle- Two Feet In, Two Feet Out with Agility Ladder Isometric Ankle Inversion - 1 sets - 20 reps - 5 seconds hold Seated Anterior Tibialis Stretch - 1 x daily - 3 reps - 30 seconds hold Standing Gastroc Stretch at Counter - 1 x daily - 3 reps - 30 seconds hold   HOME EXERCISE PROGRAM [[id number]]  ANKLE CIRCLES -  Repeat 20 Times, Hold 1 Second(s), Complete 3 Sets, Perform 1 Times a Day    PT Education - 07/30/21 1132     Education Details intervention/exercise purpose/form. self-management techniques    Person(s) Educated Patient    Methods Explanation;Demonstration;Tactile cues;Verbal cues;Handout    Comprehension Verbalized understanding;Returned demonstration;Verbal cues required;Tactile cues required;Need further instruction              PT Short Term Goals - 04/15/21 1000       PT SHORT TERM GOAL #1   Title Be independent with initial home exercise program for self-management of symptoms.    Baseline Initial HEP to be established at visit 2 as appropriate (03/13/2021); Initial HEP established visit 2 (03/18/2021);    Time 3    Period Weeks    Status Achieved    Target Date 04/03/21               PT Long Term Goals - 07/09/21 1158       PT LONG TERM GOAL #1   Title Be independent with a long-term home exercise program for self-management of symptoms.    Baseline Initial HEP to be established at visit 2 as appropriate (03/13/2021);  Initial HEP provided visit 2 (03/18/2021); continues to participate in appropriate HEP (05/29/2021); continues to participate in HEP (07/09/2021);    Time 12    Period Weeks    Status Partially Met   TARGET DATE FOR ALL LONG TERM GOALS: 06/01/2021, updated to 08/21/2021 for unmet goals     PT LONG TERM GOAL #2   Title Demonstrate improved FOTO score by 10 units to demonstrate improvement in  overall condition and self-reported functional ability.    Baseline to be measured visit 2 as appropriate (03/13/2021); 51 (03/18/2021); 51 (04/15/21): 54 (05/29/2021); 58 (07/09/2021);    Time 12    Period Weeks    Status Partially Met      PT LONG TERM GOAL #3   Title Patient will score equal or greater than 25/30 on Functional Gait Assessment to demonstrate reduction in fall risk to low fall risk classification.    Baseline to be measured vsiti 2 as appropriate (03/13/2021); 20/30 moderate fall risk (03/18/2021); 23/30 (05/29/2021); 22/30 (07/09/2021):    Time 12    Period Weeks    Status On-going      PT LONG TERM GOAL #4   Title Patient will ambulate equal or greater than 1000 feet during 6 minute walk test to demonstrate improved community mobility.    Baseline to be measured vsiti 2 as appropriate (03/13/2021); 820 feet with SPC(03/18/2021); 1238f, 4/10 back pain peak that resolves upon rest (04/15/21); 1000 feet with increasing B SIJ pain (05/29/2021); 1100 feet with increasing B low back and R ankle pain (07/09/2021);    Time 12    Period Weeks    Status Achieved      PT LONG TERM GOAL #5   Title Complete community, work and/or recreational activities with 50% less limitation due to current condition.    Baseline Functional Limitations: difficulty with activities that require standing balance including outside activities, taking the puppy out, prolonged standing, household chores, community ambulation, grooming, climbing stairs, yard work, sleeping, fall risk, etc. He states he cannot do what he wants to outside  because he is afraid to fall. He can do what he wants down on his knees, but not standing up. He has a new puppy and it is hard to keep his balance when she is pulling him (03/13/2021); states the dog doesn't pull him off balance any more and he was able to trim trees with the extended trimmer, back pain in the morning is better (05/08/2021); Patient reports difficulty with prolonged walking and going upstairs. States that his legs fatigue limits his ability to performing these tasks. Patient reports goal of walking dogs on outside uneven trails (07/09/2021);    Time 12    Period Weeks    Status Partially Met                   Plan - 07/30/21 1139     Clinical Impression Statement Today's session focused more on R ankle strengthening/mobility to support improved functional mobility and decrease buckling of lower extremities. Reviewed and updated HEP with advice for how to make ankle exercises with band more effective/challenging. Patient continues to have difficulty with R ankle inversion and sudden buckling of LE with ataxia that increases his fall risk. Did get some active inversion at R ankle with ankle circle, towel scooting, and ball squeezing exercises. Patient would benefit from continued management of limiting condition by skilled physical therapist to address remaining impairments and functional limitations to work towards stated goals and return to PLOF or maximal functional independence.    Personal Factors and Comorbidities Age;Comorbidity 3+;Past/Current Experience;Fitness;Time since onset of injury/illness/exacerbation    Comorbidities Relevant past medical history and comorbidities include seizure disorder (controlled with meds), hx R ankle fracture (wears an air brace to prevent pain for years), ACDF (05/2018 for myelopathy), R  Carpal tunnel release (06/2019), BPH, symptoms of peripheral neuropathy at all limbs (fine motor skills in B UE affected), CTS,  perfuse degenerative changes on  lumbar and cervical spine imaging (see chart for details).    Examination-Activity Limitations Bend;Locomotion Level;Stand;Carry;Sleep;Hygiene/Grooming;Caring for Others;Lift;Stairs;Squat    Examination-Participation Restrictions Shop;Community Activity;Meal Prep;Yard Work    [employer] Evolving/Moderate complexity    Rehab Potential Fair    PT Frequency 2x / week    PT Duration 12 weeks    PT Treatment/Interventions ADLs/Self Care Home Management;Cryotherapy;Traction;Moist Heat;Electrical Stimulation;DME Instruction;Gait training;Stair training;Functional mobility training;Therapeutic activities;Therapeutic exercise;Balance training;Neuromuscular re-education;Patient/family education;Passive range of motion;Dry needling;Manual techniques;Joint Manipulations;Spinal Manipulations    PT Next Visit Plan balance training, LE strength/power, R ankle strengthening as tolerated    PT Home Exercise Plan Medbridge Access Code: [email or web address]    Consulted and Agree with Plan of Care Patient             Patient will benefit from skilled therapeutic intervention in order to improve the following deficits and impairments:  Abnormal gait, Decreased knowledge of use of DME, Impaired sensation, Improper body mechanics, Pain, Decreased coordination, Decreased mobility, Postural dysfunction, Decreased activity tolerance, Decreased endurance, Decreased range of motion, Decreased strength, Hypomobility, Impaired perceived functional ability, Impaired UE functional use, Decreased balance, Difficulty walking, Impaired flexibility  Visit Diagnosis: Chronic bilateral low back pain, unspecified whether sciatica present  Unsteadiness on feet  History of falling  Difficulty in walking, not elsewhere classified     Problem List Patient Active Problem List   Diagnosis Date Noted   Hearing loss due to cerumen impaction 03/01/2021   Atypical chest pain 07/10/2020   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   Unsteadiness on feet 11/08/2019   History of spinal surgery 01/17/2019   Neck pain 01/17/2019   Weakness of hand 01/17/2019   Benign prostatic hyperplasia with urinary obstruction 18/59/0931   Systolic murmur 12/05/2445   Right carpal tunnel syndrome 02/02/2018   Ulnar neuropathy at elbow, left 02/02/2018   Peripheral neuropathy 01/26/2018   Bilateral buttock pain 06/29/2016   Failed vision screen 06/29/2016   Advanced care planning/counseling discussion 06/21/2015   Medicare annual wellness visit, subsequent 06/18/2014   Health maintenance examination 06/18/2014   Chronic ankle pain 06/18/2014   Incomplete emptying of bladder 06/07/2013   Vitamin D deficiency 05/20/2013   THYROID NODULE 05/17/2007   HLD (hyperlipidemia) 05/17/2007   TREMOR, ESSENTIAL 05/17/2007   Essential hypertension 05/17/2007   Cervical spondylosis with myelopathy and radiculopathy 05/17/2007   Seizure disorder ([place]) 05/17/2007    Everlean Alstrom. Graylon Good, PT, DPT 07/30/21, 11:41 AM   [facility] [address], [place], [address] Phone: [phone]   Fax:  [phone]  Name: Mark Holmes MRN: [id number] Date of Birth: Aug 19, 1935

## 2021-08-04 ENCOUNTER — Ambulatory Visit: Payer: Medicare Other | Admitting: Physical Therapy

## 2021-08-04 ENCOUNTER — Encounter: Payer: Self-pay | Admitting: Physical Therapy

## 2021-08-04 DIAGNOSIS — M545 Low back pain, unspecified: Secondary | ICD-10-CM

## 2021-08-04 DIAGNOSIS — R262 Difficulty in walking, not elsewhere classified: Secondary | ICD-10-CM

## 2021-08-04 DIAGNOSIS — R2681 Unsteadiness on feet: Secondary | ICD-10-CM

## 2021-08-04 DIAGNOSIS — G8929 Other chronic pain: Secondary | ICD-10-CM

## 2021-08-04 DIAGNOSIS — Z9181 History of falling: Secondary | ICD-10-CM

## 2021-08-04 NOTE — Therapy (Signed)
[facility] [address] [place], [address] Phone: [phone]   Fax:  [phone]  Physical Therapy Treatment  Patient Details  Name: Mark Holmes MRN: [id number] Date of Birth: 04-07-1935 Referring Provider (PT): Allene Dillon, NP   Encounter Date: 08/04/2021   PT End of Session - 08/04/21 1730     Visit Number 37    Number of Visits 44    Date for PT Re-Evaluation 08/21/21    Authorization Type [employer] MEDICARE reporting period from 07/09/2021    Progress Note Due on Visit 77    PT Start Time 1730    PT Stop Time 1810    PT Time Calculation (min) 40 min    Activity Tolerance Patient tolerated treatment well    Behavior During Therapy [facility] for tasks assessed/performed             Past Medical History:  Diagnosis Date   Benign prostatic hypertrophy with elevated PSA    per Dr. Jacqlyn Larsen   History of CT scan of brain 1992   normal   Hyperglycemia 01/2006   102   Hyperlipemia    Hypertension    Seizure disorder [facility])     Past Surgical History:  Procedure Laterality Date   ANTERIOR CERVICAL DECOMP/DISCECTOMY FUSION  05/2018   for myelopathy -C3/4 ([facility])   CARPAL TUNNEL RELEASE Right 06/2019   ([facility])   CATARACT EXTRACTION W/ INTRAOCULAR LENS IMPLANT Right 2012   [facility]   History of EEG  1985   normal   hospitalization  1981   observation for seizuare   TONSILLECTOMY     TRANSURETHRAL RESECTION OF PROSTATE  10/2005   [place] RESECTION OF PROSTATE  08/2017   (rpt) [place]    There were no vitals filed for this visit.   Subjective Assessment - 08/04/21 1732     Subjective Patient reports he is feeling well today. States the standing heel raise exercise is hard but the band exercises are going well. No falls since last PT session. Watched baseball this weekend.    Pertinent History Patient is a 86 y.o. male who presents to outpatient physical  therapy with a referral for medical diagnosis difficulty balancing, lumbar DDD (degenerative disc disease) with request for balance therapy. This patient's chief complaints consist of discomfort of the B LE below the knee, pain in the low back with standing, and difficulty balancing in the setting of chronic R ankle pain, B hand neuropathy symptoms and history of cervical spine disorder with ACDF at C3-4 for myelopathy leading to the following functional deficits: cannot do what he wants to outside because he is afraid to fall, taking the puppy out, prolonged standing, household chores, community ambulation, grooming, climbing stairs, yard work, sleeping, fall risk, etc. .  Relevant past medical history and comorbidities include seizure disorder (controlled with meds), hx R ankle fracture (wears an air brace to prevent pain for years), ACDF (05/2018 for myelopathy), R  Carpal tunnel release (06/2019), BPH, symptoms of peripheral neuropathy at all limbs (fine motor skills in B UE affected), CTS, perfuse degenerative changes on lumbar and cervical spine imaging (see chart for details).    Limitations Standing;Walking;Sitting;Other (comment);House hold activities    How long can you sit comfortably? "a long time' (but will feel it when he stands up)    How long can you stand comfortably? can finish the task but gets really uncomfortable in the back  Diagnostic tests Cervical spine MRI report 11/29/2020: " IMPRESSION:  1. Neurofibroma of the left C2 nerve as seen previously, maximal  transverse diameter 11-12 mm.  2. Previous ACDF C3-4. Canal stenosis remains at this level with AP  diameter of 7 mm, without worsening. No actual cord compression.  3. Spondylosis and facet arthropathy with degenerative  anterolisthesis of C5-6 and C7-T1. Canal narrowing but no cord  compression.  4. Foraminal stenosis primarily due to facet osteophytes and  uncovertebral osteophytes that could cause neural compression  bilateral at  C2-3, bilateral at C3-4, bilateral at C4-5, bilateral  but left more than right at C5-6, bilateral but left more than right  at C6-7 and bilateral but left more than right at C7-T1." Lumbar spine MRI report 11/29/2020: "IMPRESSION:  1. Degenerative disc disease and degenerative facet disease  throughout the lumbar region which could be associated with back  pain. Thoracolumbar scoliotic curvature.  2. L2-3: Moderate central canal stenosis. Bilateral lateral recess  and foraminal stenosis left worse than right. Neural compression  could occur at this level, particularly on the left.  3. L3-4: Bilateral lateral recess and foraminal stenosis, right  worse than left. Neural compression could occur on either side,  particularly on the right.  4. L4-5: Bilateral lateral recess and foraminal stenosis, right more  than left. Neural compression could occur on either side,  particularly the right.  5. L5-S1: Bilateral foraminal narrowing with some potential to  affect either exiting L5 nerve."    Patient Stated Goals he would like to walk "like normal."    Currently in Pain? No/denies             TREATMENT: Shoes/R ankle brace doffed until exiting clinic.   Manual Therapy: to increase joint mobility of the ankle to support increased overall ROM and provide relief to ankle musculature. SUPINE POSITION: R ankle - Supine subtalar medial glide in order to increase eversion of the R ankle, Grades III-IV, 2x30 seconds.  - Supine subtalar lateral glide in order to increase inversion of the R ankle, Grades III-IV, 2x30 seconds. - Supine talocural distraction to R ankle, 2x30 seconds to increase overall ROM  - Supine talocrural posterior and anterior glide, grade III-IV, 2x30 seconds each way   Neuromuscular Re-education: to improve, balance, postural strength, muscle activation patterns, and stabilization strength required for functional activities (CGA - min A as needed for safety): - 6 inch hurdles (8 hurdles):  forward step-to x 2x8 hurdles leading with each leg, CGA-min A to prevent falls. One instance of B LE buckle that required mod A to prevent fall. - forward sway on airex pad with BUE touch down support as needed, 2x10. SBA for safety.    Therapeutic exercise: to centralize symptoms and improve ROM, strength, muscular endurance, and activity tolerance required for successful completion of functional activities.  - R ankle circles in figure 4 position, 1x30 each direction - seated R ankle PF stretch with to plantarflexed under chair (touching dorsal aspect of toes to the ground), 3x30 seconds - standing LE weight shifts with B heels lifted, alternating knee flexion to take pressure off that foot and shift more weight to contralateral foot. 3x10 ("hold heels up and rock side to side") - Treadmill Walking (MOTOR OFF). Backwards pushing back of body into TM handle 1x60 seconds, 1x30 seconds; Forwards pressing with hands on handle (feet very close to back edge of TM! CGA-minA for safety). 1x60 seconds, 1x30 seconds; - Education on HEP including handout  Pt required multimodal cuing for proper technique and to facilitate improved neuromuscular control, strength, range of motion, and functional ability resulting in improved performance and form.    HOME EXERCISE PROGRAM Access Code: [email or web address] URL: [email or web address] Date: 08/05/2021 Prepared by: Rosita Kea  Exercises Sit to Stand with Arm Reach and Jump - 2-3 x weekly - 3 sets - 10 reps Half Tandem Stance Balance with Head Rotation - 2-3 x weekly - 1 sets - 20 reps Seated Ankle Plantar Flexion with Resistance Loop - 1 x daily - 3 sets - 10 reps Seated Ankle Eversion with Resistance - 1 x daily - 3 sets - 10 reps Straight Leg Raise Sciatic Nerve Flossing - 1 x daily - 7 x weekly - 3 sets - 10 reps - 1 hold Lateral Inside Outside with Agility Ladder Forward Shuffle- Two Feet In, Two Feet Out with Agility Ladder Isometric Ankle  Inversion - 1 sets - 20 reps - 5 seconds hold Seated Anterior Tibialis Stretch - 1 x daily - 3 reps - 30 seconds hold Standing Gastroc Stretch at Counter - 1 x daily - 3 reps - 30 seconds hold Standing Heel Raises - 1 x daily - 3 sets - 10 reps    [email or web address] CG [id number] [[id number]]  ANKLE CIRCLES -  Repeat 20 Times, Hold 1 Second(s), Complete 3 Sets, Perform 1 Times a Day   PT Education - 08/04/21 1738     Education Details intervention/exercise purpose/form. self-management techniques    Person(s) Educated Patient    Methods Explanation;Demonstration;Tactile cues;Verbal cues    Comprehension Verbalized understanding;Returned demonstration;Verbal cues required;Tactile cues required;Need further instruction              PT Short Term Goals - 04/15/21 1000       PT SHORT TERM GOAL #1   Title Be independent with initial home exercise program for self-management of symptoms.    Baseline Initial HEP to be established at visit 2 as appropriate (03/13/2021); Initial HEP established visit 2 (03/18/2021);    Time 3    Period Weeks    Status Achieved    Target Date 04/03/21               PT Long Term Goals - 07/09/21 1158       PT LONG TERM GOAL #1   Title Be independent with a long-term home exercise program for self-management of symptoms.    Baseline Initial HEP to be established at visit 2 as appropriate (03/13/2021); Initial HEP provided visit 2 (03/18/2021); continues to participate in appropriate HEP (05/29/2021); continues to participate in HEP (07/09/2021);    Time 12    Period Weeks    Status Partially Met   TARGET DATE FOR ALL LONG TERM GOALS: 06/01/2021, updated to 08/21/2021 for unmet goals     PT LONG TERM GOAL #2   Title Demonstrate improved FOTO score by 10 units to demonstrate improvement in overall condition and self-reported functional ability.    Baseline to be measured visit 2 as appropriate (03/13/2021); 51 (03/18/2021); 51 (04/15/21): 54 (05/29/2021); 58 (07/09/2021);     Time 12    Period Weeks    Status Partially Met      PT LONG TERM GOAL #3   Title Patient will score equal or greater than 25/30 on Functional Gait Assessment to demonstrate reduction in fall risk to low fall risk classification.    Baseline to be measured vsiti 2 as appropriate (03/13/2021); 20/30 moderate fall risk (03/18/2021); 23/30 (05/29/2021);  22/30 (07/09/2021):    Time 12    Period Weeks    Status On-going      PT LONG TERM GOAL #4   Title Patient will ambulate equal or greater than 1000 feet during 6 minute walk test to demonstrate improved community mobility.    Baseline to be measured vsiti 2 as appropriate (03/13/2021); 820 feet with SPC(03/18/2021); 1248ft, 4/10 back pain peak that resolves upon rest (04/15/21); 1000 feet with increasing B SIJ pain (05/29/2021); 1100 feet with increasing B low back and R ankle pain (07/09/2021);    Time 12    Period Weeks    Status Achieved      PT LONG TERM GOAL #5   Title Complete community, work and/or recreational activities with 50% less limitation due to current condition.    Baseline Functional Limitations: difficulty with activities that require standing balance including outside activities, taking the puppy out, prolonged standing, household chores, community ambulation, grooming, climbing stairs, yard work, sleeping, fall risk, etc. He states he cannot do what he wants to outside because he is afraid to fall. He can do what he wants down on his knees, but not standing up. He has a new puppy and it is hard to keep his balance when she is pulling him (03/13/2021); states the dog doesn't pull him off balance any more and he was able to trim trees with the extended trimmer, back pain in the morning is better (05/08/2021); Patient reports difficulty with prolonged walking and going upstairs. States that his legs fatigue limits his ability to performing these tasks. Patient reports goal of walking dogs on outside uneven trails (07/09/2021);    Time 12     Period Weeks    Status Partially Met                   Plan - 08/05/21 0918     Clinical Impression Statement Patient tolerated treatment well overall but continues to have instability at R ankle and it makes abnormal noises with increased loading such as pushing off R ankle when completing hurdles leading with L LE and attempting SLS with R heel raised. Is at risk for falls and requires CGA-min A to prevent falls when one LE or the other "gives way" during more difficult exercises. Continues to work towards improved R ankle strength, balance, functional strength. Patient would benefit from continued management of limiting condition by skilled physical therapist to address remaining impairments and functional limitations to work towards stated goals and return to PLOF or maximal functional independence.    Personal Factors and Comorbidities Age;Comorbidity 3+;Past/Current Experience;Fitness;Time since onset of injury/illness/exacerbation    Comorbidities Relevant past medical history and comorbidities include seizure disorder (controlled with meds), hx R ankle fracture (wears an air brace to prevent pain for years), ACDF (05/2018 for myelopathy), R  Carpal tunnel release (06/2019), BPH, symptoms of peripheral neuropathy at all limbs (fine motor skills in B UE affected), CTS, perfuse degenerative changes on lumbar and cervical spine imaging (see chart for details).    Examination-Activity Limitations Bend;Locomotion Level;Stand;Carry;Sleep;Hygiene/Grooming;Caring for Others;Lift;Stairs;Squat    Examination-Participation Restrictions Shop;Community Activity;Meal Prep;Yard Work    [employer] Evolving/Moderate complexity    Rehab Potential Fair    PT Frequency 2x / week    PT Duration 12 weeks    PT Treatment/Interventions ADLs/Self Care Home Management;Cryotherapy;Traction;Moist Heat;Electrical Stimulation;DME Instruction;Gait training;Stair training;Functional mobility  training;Therapeutic activities;Therapeutic exercise;Balance training;Neuromuscular re-education;Patient/family education;Passive range of motion;Dry needling;Manual techniques;Joint Manipulations;Spinal Manipulations    PT Next  Visit Plan balance training, LE strength/power, R ankle strengthening as tolerated    PT Home Exercise Plan Medbridge Access Code: [email or web address]    Consulted and Agree with Plan of Care Patient             Patient will benefit from skilled therapeutic intervention in order to improve the following deficits and impairments:  Abnormal gait, Decreased knowledge of use of DME, Impaired sensation, Improper body mechanics, Pain, Decreased coordination, Decreased mobility, Postural dysfunction, Decreased activity tolerance, Decreased endurance, Decreased range of motion, Decreased strength, Hypomobility, Impaired perceived functional ability, Impaired UE functional use, Decreased balance, Difficulty walking, Impaired flexibility  Visit Diagnosis: Chronic bilateral low back pain, unspecified whether sciatica present  Unsteadiness on feet  History of falling  Difficulty in walking, not elsewhere classified     Problem List Patient Active Problem List   Diagnosis Date Noted   Hearing loss due to cerumen impaction 03/01/2021   Atypical chest pain 07/10/2020   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   Unsteadiness on feet 11/08/2019   History of spinal surgery 01/17/2019   Neck pain 01/17/2019   Weakness of hand 01/17/2019   Benign prostatic hyperplasia with urinary obstruction 03/75/4360   Systolic murmur 67/70/3403   Right carpal tunnel syndrome 02/02/2018   Ulnar neuropathy at elbow, left 02/02/2018   Peripheral neuropathy 01/26/2018   Bilateral buttock pain 06/29/2016   Failed vision screen 06/29/2016   Advanced care planning/counseling discussion 06/21/2015   Medicare annual wellness visit, subsequent 06/18/2014   Health maintenance examination 06/18/2014   Chronic  ankle pain 06/18/2014   Incomplete emptying of bladder 06/07/2013   Vitamin D deficiency 05/20/2013   THYROID NODULE 05/17/2007   HLD (hyperlipidemia) 05/17/2007   TREMOR, ESSENTIAL 05/17/2007   Essential hypertension 05/17/2007   Cervical spondylosis with myelopathy and radiculopathy 05/17/2007   Seizure disorder ([place]) 05/17/2007   Everlean Alstrom. Graylon Good, PT, DPT 08/05/21, 9:20 AM  [facility] [address] [place], [address] Phone: [phone]   Fax:  [phone]  Name: Mark Holmes MRN: [id number] Date of Birth: 1935-05-18

## 2021-08-06 ENCOUNTER — Ambulatory Visit: Payer: Medicare Other

## 2021-08-06 DIAGNOSIS — Z9181 History of falling: Secondary | ICD-10-CM

## 2021-08-06 DIAGNOSIS — G8929 Other chronic pain: Secondary | ICD-10-CM

## 2021-08-06 DIAGNOSIS — R2681 Unsteadiness on feet: Secondary | ICD-10-CM

## 2021-08-06 DIAGNOSIS — M545 Low back pain, unspecified: Secondary | ICD-10-CM | POA: Diagnosis not present

## 2021-08-06 DIAGNOSIS — R262 Difficulty in walking, not elsewhere classified: Secondary | ICD-10-CM

## 2021-08-06 NOTE — Therapy (Signed)
[facility] [address] [place], [address] Phone: [phone]   Fax:  [phone]  Physical Therapy Treatment  Patient Details  Name: Mark Holmes MRN: [id number] Date of Birth: 1935/06/26 Referring Provider (PT): Allene Dillon, NP   Encounter Date: 08/06/2021   PT End of Session - 08/06/21 0950     Visit Number 38    Number of Visits 44    Date for PT Re-Evaluation 08/21/21    Authorization Type [employer] MEDICARE reporting period from 07/09/2021    Progress Note Due on Visit 10    PT Start Time 0946    PT Stop Time 1029    PT Time Calculation (min) 43 min    Equipment Utilized During Treatment Gait belt    Activity Tolerance Patient tolerated treatment well    Behavior During Therapy [facility] for tasks assessed/performed             Past Medical History:  Diagnosis Date   Benign prostatic hypertrophy with elevated PSA    per Dr. Jacqlyn Larsen   History of CT scan of brain 1992   normal   Hyperglycemia 01/2006   102   Hyperlipemia    Hypertension    Seizure disorder [facility])     Past Surgical History:  Procedure Laterality Date   ANTERIOR CERVICAL DECOMP/DISCECTOMY FUSION  05/2018   for myelopathy -C3/4 ([facility])   CARPAL TUNNEL RELEASE Right 06/2019   ([facility])   CATARACT EXTRACTION W/ INTRAOCULAR LENS IMPLANT Right 2012   [place] [facility]   History of EEG  1985   normal   hospitalization  1981   observation for seizuare   TONSILLECTOMY     TRANSURETHRAL RESECTION OF PROSTATE  10/2005   [place] RESECTION OF PROSTATE  08/2017   (rpt) [place]    There were no vitals filed for this visit.   Subjective Assessment - 08/06/21 0948     Subjective Pt reports feeling good after previous session. No pain and reports focus of PT has been on his balance and R ankle.    Currently in Pain? No/denies             TREATMENT: Shoes/R ankle brace doffed until exiting  clinic.   Manual Therapy: to increase joint mobility of the ankle to support increased overall ROM and provide relief to ankle musculature. SUPINE POSITION: R ankle - Supine subtalar medial glide in order to increase eversion of the R ankle, Grades III-IV, 2x30 seconds.  - Supine subtalar lateral glide in order to increase inversion of the R ankle, Grades III-IV, 2x30 seconds. - Supine talocural distraction to R ankle, 2x30 seconds to increase overall ROM  - Supine talocrural posterior and anterior glide, grade III-IV, 2x30 seconds each way    Neuro Re-Ed:    Forward hurdle steps 6x8 hurdles: x2 leading with LLE first then x4 with RLE. First 2 bouts leading with RLE lead to poor R foot clearance causing pt to lose balance anteriorly requiring PT modA to correct LOB.   Alternating cone taps for improving hip flexion/ankle DF: x10/LE. X20 with 3# AW's / LE. Intermittent finger support for balance. VC's for upright posture to further target hip flexion muscle activation. SBA  Airex Pad narrow BOS: 3x30 sec. Noted increased ankle strategy utilized by pt. Intermittent need for finger support. SBA provided   Narrow BOS ball toss for perturbation to challenge ankle/hip strategy. X3 min with ball tosses overhead  and laterally to challenge outside BOS balance. CGA provided.     There.ex:   Seated figure 4 R ankle circles CW/CCW: 2x30 sec/direction   Eccentric heel raises R ankle: 2x10   Eccentric toe raises R ankle: 2x10   Resisted R ankle DF In sitting: 3.5# AW's on ant foot, 2x12. VC's for focus on eccentric control with descent.   PT Education - 08/06/21 0949     Education Details form/technique with exercise.    Person(s) Educated Patient    Methods Explanation;Demonstration;Tactile cues;Verbal cues    Comprehension Verbalized understanding;Returned demonstration;Verbal cues required;Tactile cues required              PT Short Term Goals - 04/15/21 1000       PT SHORT TERM GOAL #1    Title Be independent with initial home exercise program for self-management of symptoms.    Baseline Initial HEP to be established at visit 2 as appropriate (03/13/2021); Initial HEP established visit 2 (03/18/2021);    Time 3    Period Weeks    Status Achieved    Target Date 04/03/21               PT Long Term Goals - 07/09/21 1158       PT LONG TERM GOAL #1   Title Be independent with a long-term home exercise program for self-management of symptoms.    Baseline Initial HEP to be established at visit 2 as appropriate (03/13/2021); Initial HEP provided visit 2 (03/18/2021); continues to participate in appropriate HEP (05/29/2021); continues to participate in HEP (07/09/2021);    Time 12    Period Weeks    Status Partially Met   TARGET DATE FOR ALL LONG TERM GOALS: 06/01/2021, updated to 08/21/2021 for unmet goals     PT LONG TERM GOAL #2   Title Demonstrate improved FOTO score by 10 units to demonstrate improvement in overall condition and self-reported functional ability.    Baseline to be measured visit 2 as appropriate (03/13/2021); 51 (03/18/2021); 51 (04/15/21): 54 (05/29/2021); 58 (07/09/2021);    Time 12    Period Weeks    Status Partially Met      PT LONG TERM GOAL #3   Title Patient will score equal or greater than 25/30 on Functional Gait Assessment to demonstrate reduction in fall risk to low fall risk classification.    Baseline to be measured vsiti 2 as appropriate (03/13/2021); 20/30 moderate fall risk (03/18/2021); 23/30 (05/29/2021); 22/30 (07/09/2021):    Time 12    Period Weeks    Status On-going      PT LONG TERM GOAL #4   Title Patient will ambulate equal or greater than 1000 feet during 6 minute walk test to demonstrate improved community mobility.    Baseline to be measured vsiti 2 as appropriate (03/13/2021); 820 feet with SPC(03/18/2021); 1264f, 4/10 back pain peak that resolves upon rest (04/15/21); 1000 feet with increasing B SIJ pain (05/29/2021); 1100 feet with  increasing B low back and R ankle pain (07/09/2021);    Time 12    Period Weeks    Status Achieved      PT LONG TERM GOAL #5   Title Complete community, work and/or recreational activities with 50% less limitation due to current condition.    Baseline Functional Limitations: difficulty with activities that require standing balance including outside activities, taking the puppy out, prolonged standing, household chores, community ambulation, grooming, climbing stairs, yard work, sleeping, fall risk, etc. He states he  cannot do what he wants to outside because he is afraid to fall. He can do what he wants down on his knees, but not standing up. He has a new puppy and it is hard to keep his balance when she is pulling him (03/13/2021); states the dog doesn't pull him off balance any more and he was able to trim trees with the extended trimmer, back pain in the morning is better (05/08/2021); Patient reports difficulty with prolonged walking and going upstairs. States that his legs fatigue limits his ability to performing these tasks. Patient reports goal of walking dogs on outside uneven trails (07/09/2021);    Time 12    Period Weeks    Status Partially Met                   Plan - 08/06/21 1054     Clinical Impression Statement Continuing primary PT's POC with focus on R ankle mobility and dynamic balance. Today pt appeared to have difficulty with R foot clearance with hurdle navigation with x3 bouts of LOB requiring Pt modA to correct. Further session with focus on improving R hip flexion/ankle DF to improve foot clearance along with dynamic balance with focus on perturbations and unstable surfaces to challenge vestibular system due to hx of neuropathy in LE's. Overall pt tolerated session well. Pt will continue to benefit from skilled PT services to continue to address R ankle mobility/strength which in turn believe would reduce this pt's risk of falling (along with continuing dynamic balane  interventions).    Personal Factors and Comorbidities Age;Comorbidity 3+;Past/Current Experience;Fitness;Time since onset of injury/illness/exacerbation    Comorbidities Relevant past medical history and comorbidities include seizure disorder (controlled with meds), hx R ankle fracture (wears an air brace to prevent pain for years), ACDF (05/2018 for myelopathy), R  Carpal tunnel release (06/2019), BPH, symptoms of peripheral neuropathy at all limbs (fine motor skills in B UE affected), CTS, perfuse degenerative changes on lumbar and cervical spine imaging (see chart for details).    Examination-Activity Limitations Bend;Locomotion Level;Stand;Carry;Sleep;Hygiene/Grooming;Caring for Others;Lift;Stairs;Squat    Examination-Participation Restrictions Shop;Community Activity;Meal Prep;Yard Work    [employer] Evolving/Moderate complexity    Rehab Potential Fair    PT Frequency 2x / week    PT Duration 12 weeks    PT Treatment/Interventions ADLs/Self Care Home Management;Cryotherapy;Traction;Moist Heat;Electrical Stimulation;DME Instruction;Gait training;Stair training;Functional mobility training;Therapeutic activities;Therapeutic exercise;Balance training;Neuromuscular re-education;Patient/family education;Passive range of motion;Dry needling;Manual techniques;Joint Manipulations;Spinal Manipulations    PT Next Visit Plan balance training, LE strength/power, R ankle strengthening as tolerated    PT Home Exercise Plan Medbridge Access Code: [email or web address]    Consulted and Agree with Plan of Care Patient             Patient will benefit from skilled therapeutic intervention in order to improve the following deficits and impairments:  Abnormal gait, Decreased knowledge of use of DME, Impaired sensation, Improper body mechanics, Pain, Decreased coordination, Decreased mobility, Postural dysfunction, Decreased activity tolerance, Decreased endurance, Decreased range of motion, Decreased  strength, Hypomobility, Impaired perceived functional ability, Impaired UE functional use, Decreased balance, Difficulty walking, Impaired flexibility  Visit Diagnosis: Chronic bilateral low back pain, unspecified whether sciatica present  Unsteadiness on feet  History of falling  Difficulty in walking, not elsewhere classified     Problem List Patient Active Problem List   Diagnosis Date Noted   Hearing loss due to cerumen impaction 03/01/2021   Atypical chest pain 07/10/2020   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   Unsteadiness  on feet 11/08/2019   History of spinal surgery 01/17/2019   Neck pain 01/17/2019   Weakness of hand 01/17/2019   Benign prostatic hyperplasia with urinary obstruction 92/34/1443   Systolic murmur 60/16/5800   Right carpal tunnel syndrome 02/02/2018   Ulnar neuropathy at elbow, left 02/02/2018   Peripheral neuropathy 01/26/2018   Bilateral buttock pain 06/29/2016   Failed vision screen 06/29/2016   Advanced care planning/counseling discussion 06/21/2015   Medicare annual wellness visit, subsequent 06/18/2014   Health maintenance examination 06/18/2014   Chronic ankle pain 06/18/2014   Incomplete emptying of bladder 06/07/2013   Vitamin D deficiency 05/20/2013   THYROID NODULE 05/17/2007   HLD (hyperlipidemia) 05/17/2007   TREMOR, ESSENTIAL 05/17/2007   Essential hypertension 05/17/2007   Cervical spondylosis with myelopathy and radiculopathy 05/17/2007   Seizure disorder ([place]) 05/17/2007    Salem Caster. Fairly IV, PT, DPT Physical Therapist- [facility]  08/06/2021, 11:08 AM  [place] [address], [place], [address] Phone: [phone]   Fax:  [phone]  Name: Mark Holmes MRN: [id number] Date of Birth: [birth year]

## 2021-08-12 ENCOUNTER — Encounter: Payer: Self-pay | Admitting: Physical Therapy

## 2021-08-12 ENCOUNTER — Ambulatory Visit: Payer: Medicare Other | Admitting: Physical Therapy

## 2021-08-12 DIAGNOSIS — Z9181 History of falling: Secondary | ICD-10-CM

## 2021-08-12 DIAGNOSIS — R262 Difficulty in walking, not elsewhere classified: Secondary | ICD-10-CM

## 2021-08-12 DIAGNOSIS — G8929 Other chronic pain: Secondary | ICD-10-CM

## 2021-08-12 DIAGNOSIS — R2681 Unsteadiness on feet: Secondary | ICD-10-CM

## 2021-08-12 DIAGNOSIS — M545 Low back pain, unspecified: Secondary | ICD-10-CM

## 2021-08-12 NOTE — Therapy (Signed)
[facility] [address] [place], [address] Phone: [phone]   Fax:  [phone]  Physical Therapy Treatment  Patient Details  Name: Mark Holmes MRN: [id number] Date of Birth: Apr 24, 1935 Referring Provider (PT): Allene Dillon, NP   Encounter Date: 08/12/2021   PT End of Session - 08/12/21 1038     Visit Number 39    Number of Visits 44    Date for PT Re-Evaluation 08/21/21    Authorization Type [employer] MEDICARE reporting period from 07/09/2021    Progress Note Due on Visit 46    PT Start Time 1033    PT Stop Time 1113    PT Time Calculation (min) 40 min    Equipment Utilized During Treatment Gait belt    Activity Tolerance Patient tolerated treatment well    Behavior During Therapy [facility] for tasks assessed/performed             Past Medical History:  Diagnosis Date   Benign prostatic hypertrophy with elevated PSA    per Dr. Jacqlyn Larsen   History of CT scan of brain 1992   normal   Hyperglycemia 01/2006   102   Hyperlipemia    Hypertension    Seizure disorder [facility])     Past Surgical History:  Procedure Laterality Date   ANTERIOR CERVICAL DECOMP/DISCECTOMY FUSION  05/2018   for myelopathy -C3/4 ([facility])   CARPAL TUNNEL RELEASE Right 06/2019   ([facility])   CATARACT EXTRACTION W/ INTRAOCULAR LENS IMPLANT Right 2012   [facility]   History of EEG  1985   normal   hospitalization  1981   observation for seizuare   TONSILLECTOMY     TRANSURETHRAL RESECTION OF PROSTATE  10/2005   [place] RESECTION OF PROSTATE  08/2017   (rpt) [place]    There were no vitals filed for this visit.   Subjective Assessment - 08/12/21 1035     Subjective Patient states he is feeling well today. His wife is out of town so he is at home with the dog. He reports some achiness in his R ankle that he feels is chronic and will likely never completely go away. He denies any falls since  last PT session. Arrives carrying his SPC.    Pertinent History Patient is a 86 y.o. male who presents to outpatient physical therapy with a referral for medical diagnosis difficulty balancing, lumbar DDD (degenerative disc disease) with request for balance therapy. This patient's chief complaints consist of discomfort of the B LE below the knee, pain in the low back with standing, and difficulty balancing in the setting of chronic R ankle pain, B hand neuropathy symptoms and history of cervical spine disorder with ACDF at C3-4 for myelopathy leading to the following functional deficits: cannot do what he wants to outside because he is afraid to fall, taking the puppy out, prolonged standing, household chores, community ambulation, grooming, climbing stairs, yard work, sleeping, fall risk, etc. .  Relevant past medical history and comorbidities include seizure disorder (controlled with meds), hx R ankle fracture (wears an air brace to prevent pain for years), ACDF (05/2018 for myelopathy), R  Carpal tunnel release (06/2019), BPH, symptoms of peripheral neuropathy at all limbs (fine motor skills in B UE affected), CTS, perfuse degenerative changes on lumbar and cervical spine imaging (see chart for details).    Limitations Standing;Walking;Sitting;Other (comment);House hold activities    Diagnostic tests Cervical spine MRI report 11/29/2020: "  IMPRESSION:  1. Neurofibroma of the left C2 nerve as seen previously, maximal  transverse diameter 11-12 mm.  2. Previous ACDF C3-4. Canal stenosis remains at this level with AP  diameter of 7 mm, without worsening. No actual cord compression.  3. Spondylosis and facet arthropathy with degenerative  anterolisthesis of C5-6 and C7-T1. Canal narrowing but no cord  compression.  4. Foraminal stenosis primarily due to facet osteophytes and  uncovertebral osteophytes that could cause neural compression  bilateral at C2-3, bilateral at C3-4, bilateral at C4-5, bilateral  but left  more than right at C5-6, bilateral but left more than right  at C6-7 and bilateral but left more than right at C7-T1." Lumbar spine MRI report 11/29/2020: "IMPRESSION:  1. Degenerative disc disease and degenerative facet disease  throughout the lumbar region which could be associated with back  pain. Thoracolumbar scoliotic curvature.  2. L2-3: Moderate central canal stenosis. Bilateral lateral recess  and foraminal stenosis left worse than right. Neural compression  could occur at this level, particularly on the left.  3. L3-4: Bilateral lateral recess and foraminal stenosis, right  worse than left. Neural compression could occur on either side,  particularly on the right.  4. L4-5: Bilateral lateral recess and foraminal stenosis, right more  than left. Neural compression could occur on either side,  particularly the right.  5. L5-S1: Bilateral foraminal narrowing with some potential to  affect either exiting L5 nerve."    Patient Stated Goals he would like to walk "like normal."    Currently in Pain? Yes    Pain Score 1    achines in R ankle              TREATMENT: Shoes/R ankle brace doffed until exiting clinic.   Manual Therapy: to increase joint mobility of the ankle to support increased overall ROM and provide relief to ankle musculature. SUPINE POSITION: R ankle - Supine subtalar medial glide in order to increase eversion of the R ankle, Grades III-IV, 2x30 seconds.  - Supine subtalar lateral glide in order to increase inversion of the R ankle, Grades III-IV, 2x30 seconds. - Supine talocural distraction to R ankle, 2x30 seconds to increase overall ROM  - Supine talocrural posterior and anterior glide, grade III-IV, 2x30 seconds each way  Therapeutic exercise: to centralize symptoms and improve ROM, strength, muscular endurance, and activity tolerance required for successful completion of functional activities.  - review of HEP (see below) including re-printing handouts and organizing  them into two sets of exercises to do on different days with input from patient.  - seated eccentric heel raises while leaning on knees for more load and 3# AW donned, 1x20 each side.   Neuromuscular Re-education: to improve, balance, postural strength, muscle activation patterns, and stabilization strength required for functional activities: - Alternating cone taps for improving hip flexion/ankle DF: X20-30 each side with 3# AW's LE. Finger support for balance. VC's for upright posture and lighter hand grip to further target hip flexion muscle activation. CGA with gait belt.    Pt required multimodal cuing for proper technique and to facilitate improved neuromuscular control, strength, range of motion, and functional ability resulting in improved performance and form.    HOME EXERCISE PROGRAM Access Code: [email or web address] URL: [email or web address] Date: 08/12/2021 Prepared by: Rosita Kea  Exercises Half Tandem Stance Balance with Head Rotation - 2-3 x weekly - 1 sets - 20 reps Seated Ankle Plantar Flexion with Resistance Loop - 1 x daily - 3 sets - 10  reps Standing Heel Raises - 1 x daily - 3 sets - 10 reps Standing Gastroc Stretch at Counter - 1 x daily - 3 reps - 30 seconds hold Runner's Climb - 3 sets - 10 reps Straight Leg Raise Sciatic Nerve Flossing - 1 x daily - 7 x weekly - 3 sets - 10 reps - 1 hold Seated Ankle Eversion with Resistance - 1 x daily - 3 sets - 10 reps Isometric Ankle Inversion - 1 sets - 20 reps - 5 seconds hold Seated Anterior Tibialis Stretch - 1 x daily - 3 reps - 30 seconds hold Seated Heel Raise - 3 sets - 10 reps Lateral Inside Outside with Agility Ladder Forward Shuffle- Two Feet In, Two Feet Out with Agility Ladder Sit to Stand with Arm Reach and Jump - 2-3 x weekly - 3 sets - 10 reps   [email or web address] [id number]  [[id number]]  ANKLE CIRCLES -  Repeat 20 Times, Hold 1 Second(s), Complete 3 Sets, Perform 1 Times a Day  CUPS - ALTERNATE TOE TAPS -  Repeat  10 Times, Hold 1 Second(s), Complete 3 Sets, Perform 3 Times a Week   PT Education - 08/12/21 1038     Education Details form/technique with exercise.    Person(s) Educated Patient    Methods Explanation;Demonstration;Tactile cues;Verbal cues    Comprehension Returned demonstration;Verbal cues required;Tactile cues required;Verbalized understanding;Need further instruction              PT Short Term Goals - 04/15/21 1000       PT SHORT TERM GOAL #1   Title Be independent with initial home exercise program for self-management of symptoms.    Baseline Initial HEP to be established at visit 2 as appropriate (03/13/2021); Initial HEP established visit 2 (03/18/2021);    Time 3    Period Weeks    Status Achieved    Target Date 04/03/21               PT Long Term Goals - 07/09/21 1158       PT LONG TERM GOAL #1   Title Be independent with a long-term home exercise program for self-management of symptoms.    Baseline Initial HEP to be established at visit 2 as appropriate (03/13/2021); Initial HEP provided visit 2 (03/18/2021); continues to participate in appropriate HEP (05/29/2021); continues to participate in HEP (07/09/2021);    Time 12    Period Weeks    Status Partially Met   TARGET DATE FOR ALL LONG TERM GOALS: 06/01/2021, updated to 08/21/2021 for unmet goals     PT LONG TERM GOAL #2   Title Demonstrate improved FOTO score by 10 units to demonstrate improvement in overall condition and self-reported functional ability.    Baseline to be measured visit 2 as appropriate (03/13/2021); 51 (03/18/2021); 51 (04/15/21): 54 (05/29/2021); 58 (07/09/2021);    Time 12    Period Weeks    Status Partially Met      PT LONG TERM GOAL #3   Title Patient will score equal or greater than 25/30 on Functional Gait Assessment to demonstrate reduction in fall risk to low fall risk classification.    Baseline to be measured vsiti 2 as appropriate (03/13/2021); 20/30 moderate fall risk (03/18/2021); 23/30  (05/29/2021); 22/30 (07/09/2021):    Time 12    Period Weeks    Status On-going      PT LONG TERM GOAL #4   Title Patient will ambulate equal or greater than 1000 feet during 6  minute walk test to demonstrate improved community mobility.    Baseline to be measured vsiti 2 as appropriate (03/13/2021); 820 feet with SPC(03/18/2021); 1227f, 4/10 back pain peak that resolves upon rest (04/15/21); 1000 feet with increasing B SIJ pain (05/29/2021); 1100 feet with increasing B low back and R ankle pain (07/09/2021);    Time 12    Period Weeks    Status Achieved      PT LONG TERM GOAL #5   Title Complete community, work and/or recreational activities with 50% less limitation due to current condition.    Baseline Functional Limitations: difficulty with activities that require standing balance including outside activities, taking the puppy out, prolonged standing, household chores, community ambulation, grooming, climbing stairs, yard work, sleeping, fall risk, etc. He states he cannot do what he wants to outside because he is afraid to fall. He can do what he wants down on his knees, but not standing up. He has a new puppy and it is hard to keep his balance when she is pulling him (03/13/2021); states the dog doesn't pull him off balance any more and he was able to trim trees with the extended trimmer, back pain in the morning is better (05/08/2021); Patient reports difficulty with prolonged walking and going upstairs. States that his legs fatigue limits his ability to performing these tasks. Patient reports goal of walking dogs on outside uneven trails (07/09/2021);    Time 12    Period Weeks    Status Partially Met                   Plan - 08/12/21 1211     Clinical Impression Statement Patient tolerated treatment well overall. Session focused on updating, reviewing, and organizing HEP as requested by patient in preparation for discharge. Patient provided with list of exercises for separate days to  keep it more manageable. He continues to want to do all of the exercises to improve his ability to progress independently. Also continued working on balance and hip/ankle strength for improved function. Patient is unsteady and requires UE support or CGA for safety but did not require physical assist to maintain balance this session. Patient would benefit from continued management of limiting condition by skilled physical therapist to address remaining impairments and functional limitations to work towards stated goals and return to PLOF or maximal functional independence.    Personal Factors and Comorbidities Age;Comorbidity 3+;Past/Current Experience;Fitness;Time since onset of injury/illness/exacerbation    Comorbidities Relevant past medical history and comorbidities include seizure disorder (controlled with meds), hx R ankle fracture (wears an air brace to prevent pain for years), ACDF (05/2018 for myelopathy), R  Carpal tunnel release (06/2019), BPH, symptoms of peripheral neuropathy at all limbs (fine motor skills in B UE affected), CTS, perfuse degenerative changes on lumbar and cervical spine imaging (see chart for details).    Examination-Activity Limitations Bend;Locomotion Level;Stand;Carry;Sleep;Hygiene/Grooming;Caring for Others;Lift;Stairs;Squat    Examination-Participation Restrictions Shop;Community Activity;Meal Prep;Yard Work    SMerchant navy officerEvolving/Moderate complexity    Rehab Potential Fair    PT Frequency 2x / week    PT Duration 12 weeks    PT Treatment/Interventions ADLs/Self Care Home Management;Cryotherapy;Traction;Moist Heat;Electrical Stimulation;DME Instruction;Gait training;Stair training;Functional mobility training;Therapeutic activities;Therapeutic exercise;Balance training;Neuromuscular re-education;Patient/family education;Passive range of motion;Dry needling;Manual techniques;Joint Manipulations;Spinal Manipulations    PT Next Visit Plan balance  training, LE strength/power, R ankle strengthening as tolerated    PT Home Exercise Plan Medbridge Access Code: [email or web address]    Consulted and Agree with Plan of Care  Patient             Patient will benefit from skilled therapeutic intervention in order to improve the following deficits and impairments:  Abnormal gait, Decreased knowledge of use of DME, Impaired sensation, Improper body mechanics, Pain, Decreased coordination, Decreased mobility, Postural dysfunction, Decreased activity tolerance, Decreased endurance, Decreased range of motion, Decreased strength, Hypomobility, Impaired perceived functional ability, Impaired UE functional use, Decreased balance, Difficulty walking, Impaired flexibility  Visit Diagnosis: Chronic bilateral low back pain, unspecified whether sciatica present  Unsteadiness on feet  History of falling  Difficulty in walking, not elsewhere classified     Problem List Patient Active Problem List   Diagnosis Date Noted   Hearing loss due to cerumen impaction 03/01/2021   Atypical chest pain 07/10/2020   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   Unsteadiness on feet 11/08/2019   History of spinal surgery 01/17/2019   Neck pain 01/17/2019   Weakness of hand 01/17/2019   Benign prostatic hyperplasia with urinary obstruction 44/96/7591   Systolic murmur 63/84/6659   Right carpal tunnel syndrome 02/02/2018   Ulnar neuropathy at elbow, left 02/02/2018   Peripheral neuropathy 01/26/2018   Bilateral buttock pain 06/29/2016   Failed vision screen 06/29/2016   Advanced care planning/counseling discussion 06/21/2015   Medicare annual wellness visit, subsequent 06/18/2014   Health maintenance examination 06/18/2014   Chronic ankle pain 06/18/2014   Incomplete emptying of bladder 06/07/2013   Vitamin D deficiency 05/20/2013   THYROID NODULE 05/17/2007   HLD (hyperlipidemia) 05/17/2007   TREMOR, ESSENTIAL 05/17/2007   Essential hypertension 05/17/2007   Cervical  spondylosis with myelopathy and radiculopathy 05/17/2007   Seizure disorder ([place]) 05/17/2007    Everlean Alstrom. Graylon Good, PT, DPT 08/12/21, 12:12 PM   [facility] [address] [place], [address] Phone: [phone]   Fax:  [phone]  Name: Mark Holmes MRN: [id number] Date of Birth: 11/26/1935

## 2021-08-14 ENCOUNTER — Other Ambulatory Visit: Payer: Self-pay

## 2021-08-14 ENCOUNTER — Encounter: Payer: Self-pay | Admitting: Physical Therapy

## 2021-08-14 ENCOUNTER — Ambulatory Visit: Payer: Medicare Other | Admitting: Physical Therapy

## 2021-08-14 DIAGNOSIS — M545 Low back pain, unspecified: Secondary | ICD-10-CM | POA: Diagnosis not present

## 2021-08-14 DIAGNOSIS — Z9181 History of falling: Secondary | ICD-10-CM

## 2021-08-14 DIAGNOSIS — R2681 Unsteadiness on feet: Secondary | ICD-10-CM

## 2021-08-14 DIAGNOSIS — R262 Difficulty in walking, not elsewhere classified: Secondary | ICD-10-CM

## 2021-08-14 NOTE — Therapy (Signed)
[place] [address], [place], [address] Phone: [phone]   Fax:  [phone]  Physical Therapy Treatment / Progress Note Dates of reporting: 07/09/2021 - 08/14/2021  Patient Details  Name: Mark Holmes MRN: [id number] Date of Birth: 01-24-1935 Referring Provider (PT): Allene Dillon, NP   Encounter Date: 08/14/2021   PT End of Session - 08/14/21 1102     Visit Number 40    Number of Visits 44    Date for PT Re-Evaluation 08/21/21    Authorization Type [employer] MEDICARE reporting period from 07/09/2021    Progress Note Due on Visit 40    PT Start Time 1030    PT Stop Time 1110    PT Time Calculation (min) 40 min    Equipment Utilized During Treatment Gait belt    Activity Tolerance Patient tolerated treatment well    Behavior During Therapy WFL for tasks assessed/performed             Past Medical History:  Diagnosis Date   Benign prostatic hypertrophy with elevated PSA    per Dr. Jacqlyn Larsen   History of CT scan of brain 1992   normal   Hyperglycemia 01/2006   102   Hyperlipemia    Hypertension    Seizure disorder [facility])     Past Surgical History:  Procedure Laterality Date   ANTERIOR CERVICAL DECOMP/DISCECTOMY FUSION  05/2018   for myelopathy -C3/4 ([facility])   CARPAL TUNNEL RELEASE Right 06/2019   ([facility])   CATARACT EXTRACTION W/ INTRAOCULAR LENS IMPLANT Right 2012   [facility]   History of EEG  1985   normal   hospitalization  1981   observation for seizuare   TONSILLECTOMY     TRANSURETHRAL RESECTION OF PROSTATE  10/2005   [place] RESECTION OF PROSTATE  08/2017   (rpt) [facility]    There were no vitals filed for this visit.   Subjective Assessment - 08/14/21 1033     Subjective Patient reports he is a little stiff this morning but has no pain. States he made a spreadsheet of his HEP and brought in a copy for the PT to see. He made this to help him  stay committed to it. He is going to try out the schedule between now and his next appointment. He feels he has benefitted from PT but still has difficulty with his balance. Arrives carrying [facility] and with air cast on R ankle.    Pertinent History Patient is a 86 y.o. male who presents to outpatient physical therapy with a referral for medical diagnosis difficulty balancing, lumbar DDD (degenerative disc disease) with request for balance therapy. This patient's chief complaints consist of discomfort of the B LE below the knee, pain in the low back with standing, and difficulty balancing in the setting of chronic R ankle pain, B hand neuropathy symptoms and history of cervical spine disorder with ACDF at C3-4 for myelopathy leading to the following functional deficits: cannot do what he wants to outside because he is afraid to fall, taking the puppy out, prolonged standing, household chores, community ambulation, grooming, climbing stairs, yard work, sleeping, fall risk, etc. .  Relevant past medical history and comorbidities include seizure disorder (controlled with meds), hx R ankle fracture (wears an air brace to prevent pain for years), ACDF (05/2018 for myelopathy), R  Carpal tunnel release (06/2019), BPH, symptoms of peripheral neuropathy at all limbs (fine motor skills in  B UE affected), CTS, perfuse degenerative changes on lumbar and cervical spine imaging (see chart for details).    Limitations Standing;Walking;Sitting;Other (comment);House hold activities    Diagnostic tests Cervical spine MRI report 11/29/2020: " IMPRESSION:  1. Neurofibroma of the left C2 nerve as seen previously, maximal  transverse diameter 11-12 mm.  2. Previous ACDF C3-4. Canal stenosis remains at this level with AP  diameter of 7 mm, without worsening. No actual cord compression.  3. Spondylosis and facet arthropathy with degenerative  anterolisthesis of C5-6 and C7-T1. Canal narrowing but no cord  compression.  4. Foraminal stenosis  primarily due to facet osteophytes and  uncovertebral osteophytes that could cause neural compression  bilateral at C2-3, bilateral at C3-4, bilateral at C4-5, bilateral  but left more than right at C5-6, bilateral but left more than right  at C6-7 and bilateral but left more than right at C7-T1." Lumbar spine MRI report 11/29/2020: "IMPRESSION:  1. Degenerative disc disease and degenerative facet disease  throughout the lumbar region which could be associated with back  pain. Thoracolumbar scoliotic curvature.  2. L2-3: Moderate central canal stenosis. Bilateral lateral recess  and foraminal stenosis left worse than right. Neural compression  could occur at this level, particularly on the left.  3. L3-4: Bilateral lateral recess and foraminal stenosis, right  worse than left. Neural compression could occur on either side,  particularly on the right.  4. L4-5: Bilateral lateral recess and foraminal stenosis, right more  than left. Neural compression could occur on either side,  particularly the right.  5. L5-S1: Bilateral foraminal narrowing with some potential to  affect either exiting L5 nerve."    Patient Stated Goals he would like to walk "like normal."    Currently in Pain? No/denies                [facility] PT Assessment - 08/14/21 0001       Assessment   Medical Diagnosis difficulty balancing, lumbar DDD (degenerative disc disease), request for balance therapy    Referring Provider (PT) Whitney Meeler, NP    Prior Therapy reports a few visits in the past for low back pain      Precautions   Precautions None      Restrictions   Weight Bearing Restrictions No      Prior Function   Level of Independence Independent   used to do yard work, work on his home, Social research officer, government.   Vocation Retired    [email or web address] hobby, puppy, yard work      Charity fundraiser Status Within Functional Limits for tasks assessed      Observation/Other Assessments   Focus on Therapeutic Outcomes  (FOTO)  53      Functional Gait  Assessment   Gait assessed  --   No AD. Shoes and R air brace donned   Gait Level Surface Walks 20 ft in less than 5.5 sec, no assistive devices, good speed, no evidence for imbalance, normal gait pattern, deviates no more than 6 in outside of the 12 in walkway width.   5.4  seconds   Change in Gait Speed Able to smoothly change walking speed without loss of balance or gait deviation. Deviate no more than 6 in outside of the 12 in walkway width.    Gait with Horizontal Head Turns Performs head turns smoothly with slight change in gait velocity (eg, minor disruption to smooth gait path), deviates 6-10 in outside 12 in walkway width, or uses  an assistive device.    Gait with Vertical Head Turns Performs head turns with no change in gait. Deviates no more than 6 in outside 12 in walkway width.    Gait and Pivot Turn Pivot turns safely within 3 sec and stops quickly with no loss of balance.    Step Over Obstacle Is able to step over 2 stacked shoe boxes taped together (9 in total height) without changing gait speed. No evidence of imbalance.    Gait with Narrow Base of Support Ambulates less than 4 steps heel to toe or cannot perform without assistance.    Gait with Eyes Closed Cannot walk 20 ft without assistance, severe gait deviations or imbalance, deviates greater than 15 in outside 12 in walkway width or will not attempt task.    Ambulating Backwards Walks 20 ft, uses assistive device, slower speed, mild gait deviations, deviates 6-10 in outside 12 in walkway width.    Steps Alternating feet, must use rail.    Total Score 21    FGA comment: 19-24 = medium risk fall   Performed with shoes and R ankle brace           OBJECTIVE   FOTO =  53  (08/14/2021)   FUNCTIONAL TESTS 6 Minute Walk Test: 1163f c progressive pain near the B low back  4/10 and at B anterior ankles. Slightly stooped with progressive R trunk lean. Decreased push off with R ankle while  ambulating. Wide stance. Did not use AD, but did wear ankle brace. Supervision for initial portion but needed CGA last minute due to pain at back and fatigue.  FGA = 21/30 (see above). Noted for altered gait pattern at times with ginger-ness or difficulty when weight bearing through R ankle.    TREATMENT:     Therapeutic exercise: to centralize symptoms and improve ROM, strength, muscular endurance, and activity tolerance required for successful completion of functional activities.  - ambulation around clinic for time in 6 minutes, no AD, CGA for last minute (See above). - Education on diagnosis, prognosis, POC, anatomy and physiology of current condition.    Neuromuscular Re-education: to improve, balance, postural strength, muscle activation patterns, and stabilization strength required for functional activities (CGA - supervision  as needed for safety): - functional gait assessment (see above) - Alternating cone taps for improving hip flexion/ankle DF: 1x10 each side no AW. 1x20 each side with 3# AW's LE. Finger support for balance. VC's for upright posture and lighter hand grip to further target hip flexion muscle activation. SBA with gait belt.      Pt required multimodal cuing for proper technique and to facilitate improved neuromuscular control, strength, range of motion, and functional ability resulting in improved performance and form.     PT Education - 08/14/21 1333     Education Details form/technique with exercise, POC, progress    Person(s) Educated Patient    Methods Explanation;Demonstration;Tactile cues;Verbal cues    Comprehension Verbalized understanding;Returned demonstration;Verbal cues required;Tactile cues required;Need further instruction              PT Short Term Goals - 04/15/21 1000       PT SHORT TERM GOAL #1   Title Be independent with initial home exercise program for self-management of symptoms.    Baseline Initial HEP to be established at visit 2  as appropriate (03/13/2021); Initial HEP established visit 2 (03/18/2021);    Time 3    Period Weeks    Status Achieved  Target Date 04/03/21               PT Long Term Goals - 08/14/21 1341       PT LONG TERM GOAL #1   Title Be independent with a long-term home exercise program for self-management of symptoms.    Baseline Initial HEP to be established at visit 2 as appropriate (03/13/2021); Initial HEP provided visit 2 (03/18/2021); continues to participate in appropriate HEP (05/29/2021); continues to participate in HEP (07/09/2021); continues to participate in HEP, updated long term HEP provided (08/14/2021);    Time 12    Period Weeks    Status Partially Met   TARGET DATE FOR ALL LONG TERM GOALS: 06/01/2021, updated to 08/21/2021 for unmet goals     PT LONG TERM GOAL #2   Title Demonstrate improved FOTO score by 10 units to demonstrate improvement in overall condition and self-reported functional ability.    Baseline to be measured visit 2 as appropriate (03/13/2021); 51 (03/18/2021); 51 (04/15/21): 54 (05/29/2021); 58 (07/09/2021); 53 (08/14/2021);    Time 12    Period Weeks    Status Partially Met      PT LONG TERM GOAL #3   Title Patient will score equal or greater than 25/30 on Functional Gait Assessment to demonstrate reduction in fall risk to low fall risk classification.    Baseline to be measured vsiti 2 as appropriate (03/13/2021); 20/30 moderate fall risk (03/18/2021); 23/30 (05/29/2021); 22/30 (07/09/2021): 21/30 (08/14/2021);    Time 12    Period Weeks    Status On-going      PT LONG TERM GOAL #4   Title Patient will ambulate equal or greater than 1000 feet during 6 minute walk test to demonstrate improved community mobility.    Baseline to be measured vsiti 2 as appropriate (03/13/2021); 820 feet with SPC(03/18/2021); 1260f, 4/10 back pain peak that resolves upon rest (04/15/21); 1000 feet with increasing B SIJ pain (05/29/2021); 1100 feet with increasing B low back and R ankle pain  (07/09/2021); 1162 feet with B low back and ankle pain (05/14/2021);    Time 12    Period Weeks    Status Achieved      PT LONG TERM GOAL #5   Title Complete community, work and/or recreational activities with 50% less limitation due to current condition.    Baseline Functional Limitations: difficulty with activities that require standing balance including outside activities, taking the puppy out, prolonged standing, household chores, community ambulation, grooming, climbing stairs, yard work, sleeping, fall risk, etc. He states he cannot do what he wants to outside because he is afraid to fall. He can do what he wants down on his knees, but not standing up. He has a new puppy and it is hard to keep his balance when she is pulling him (03/13/2021); states the dog doesn't pull him off balance any more and he was able to trim trees with the extended trimmer, back pain in the morning is better (05/08/2021); Patient reports difficulty with prolonged walking and going upstairs. States that his legs fatigue limits his ability to performing these tasks. Patient reports goal of walking dogs on outside uneven trails (07/09/2021); reports some improvement form initial eval, feels stronger and less imbalance, but continues to have dififculty with similar problems (08/14/2021);    Time 12    Period Weeks    Status Partially Met                   Plan -  08/14/21 1340     Clinical Impression Statement Patient has attended 40 physical therapy sessions this episode of care. He made good initial progress towards goals and has improved 6MWT distance (820 feet to 1162 feet) since initial evaluation but more recently his progress has come to a plateau. Patient has significant R ankle dysfunction, peripheral neuropathy, and ataxia that contributes to his condition. Physical therapy interventions have been utilized throughout episode of care to address impairments and functional limitations but it appears he has  reached maximum improvement at this time. Patient has been provided with comprehensive HEP for continued independent management and would benefit from 1-2 more visits to make sure he understands and is able to participate safely. Patient does continue to demonstrate moderate fall risk (FGA 21/30). He would benefit from continued attention from medical team to monitor his condition for any other interventions that would be beneficial. He has been advised to seek further PT in the future if his condition worsens again. Patient would benefit from continued management of limiting condition for 1-2 more visits by skilled physical therapist to address remaining impairments and functional limitations to work towards stated goals and return to PLOF or maximal functional independence.    Personal Factors and Comorbidities Age;Comorbidity 3+;Past/Current Experience;Fitness;Time since onset of injury/illness/exacerbation    Comorbidities Relevant past medical history and comorbidities include seizure disorder (controlled with meds), hx R ankle fracture (wears an air brace to prevent pain for years), ACDF (05/2018 for myelopathy), R  Carpal tunnel release (06/2019), BPH, symptoms of peripheral neuropathy at all limbs (fine motor skills in B UE affected), CTS, perfuse degenerative changes on lumbar and cervical spine imaging (see chart for details).    Examination-Activity Limitations Bend;Locomotion Level;Stand;Carry;Sleep;Hygiene/Grooming;Caring for Others;Lift;Stairs;Squat    Examination-Participation Restrictions Shop;Community Activity;Meal Prep;Yard Work    Merchant navy officer Evolving/Moderate complexity    Rehab Potential Fair    PT Frequency 2x / week    PT Duration 12 weeks    PT Treatment/Interventions ADLs/Self Care Home Management;Cryotherapy;Traction;Moist Heat;Electrical Stimulation;DME Instruction;Gait training;Stair training;Functional mobility training;Therapeutic activities;Therapeutic  exercise;Balance training;Neuromuscular re-education;Patient/family education;Passive range of motion;Dry needling;Manual techniques;Joint Manipulations;Spinal Manipulations    PT Next Visit Plan refine HEP for long term participation and discharge in 1-2 visits from 08/14/2021    PT [place] Access Code: [email or web address]    Consulted and Agree with Plan of Care Patient             Patient will benefit from skilled therapeutic intervention in order to improve the following deficits and impairments:  Abnormal gait, Decreased knowledge of use of DME, Impaired sensation, Improper body mechanics, Pain, Decreased coordination, Decreased mobility, Postural dysfunction, Decreased activity tolerance, Decreased endurance, Decreased range of motion, Decreased strength, Hypomobility, Impaired perceived functional ability, Impaired UE functional use, Decreased balance, Difficulty walking, Impaired flexibility  Visit Diagnosis: Chronic bilateral low back pain, unspecified whether sciatica present  Unsteadiness on feet  History of falling  Difficulty in walking, not elsewhere classified     Problem List Patient Active Problem List   Diagnosis Date Noted   Hearing loss due to cerumen impaction 03/01/2021   Atypical chest pain 07/10/2020   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   Unsteadiness on feet 11/08/2019   History of spinal surgery 01/17/2019   Neck pain 01/17/2019   Weakness of hand 01/17/2019   Benign prostatic hyperplasia with urinary obstruction 96/03/5408   Systolic murmur 81/19/1478   Right carpal tunnel syndrome 02/02/2018   Ulnar neuropathy at elbow, left 02/02/2018   Peripheral  neuropathy 01/26/2018   Bilateral buttock pain 06/29/2016   Failed vision screen 06/29/2016   Advanced care planning/counseling discussion 06/21/2015   Medicare annual wellness visit, subsequent 06/18/2014   Health maintenance examination 06/18/2014   Chronic ankle pain 06/18/2014   Incomplete  emptying of bladder 06/07/2013   Vitamin D deficiency 05/20/2013   THYROID NODULE 05/17/2007   HLD (hyperlipidemia) 05/17/2007   TREMOR, ESSENTIAL 05/17/2007   Essential hypertension 05/17/2007   Cervical spondylosis with myelopathy and radiculopathy 05/17/2007   Seizure disorder ([place]) 05/17/2007    Everlean Alstrom. Graylon Good, PT, DPT 08/14/21, 1:44 PM  [place] [address], [place], [address] Phone: [phone]   Fax:  [phone]  Name: Mark Holmes MRN: [id number] Date of Birth: 11-15-1935

## 2021-08-15 ENCOUNTER — Other Ambulatory Visit: Payer: Self-pay

## 2021-08-15 NOTE — Telephone Encounter (Signed)
Pt called to get refilled switched to [facility]. Pt has [facility] on profile. Pt said if med was recently sent to [facility] he will continue to get med there for now until pt finds out which [facility] is correct on his pharmacy profile. Nothing further needed at this time.

## 2021-08-18 ENCOUNTER — Encounter: Payer: Self-pay | Admitting: Physical Therapy

## 2021-08-18 ENCOUNTER — Ambulatory Visit: Payer: Medicare Other | Admitting: Physical Therapy

## 2021-08-18 DIAGNOSIS — G8929 Other chronic pain: Secondary | ICD-10-CM

## 2021-08-18 DIAGNOSIS — Z9181 History of falling: Secondary | ICD-10-CM

## 2021-08-18 DIAGNOSIS — M545 Low back pain, unspecified: Secondary | ICD-10-CM

## 2021-08-18 DIAGNOSIS — R262 Difficulty in walking, not elsewhere classified: Secondary | ICD-10-CM

## 2021-08-18 DIAGNOSIS — R2681 Unsteadiness on feet: Secondary | ICD-10-CM

## 2021-08-18 NOTE — Therapy (Signed)
[facility] [address] [place], [address] Phone: [phone]   Fax:  [phone]  Physical Therapy Treatment  Patient Details  Name: Mark Holmes MRN: [id number] Date of Birth: Nov 21, 1935 Referring Provider (PT): Allene Dillon, NP   Encounter Date: 08/18/2021   PT End of Session - 08/18/21 1534     Visit Number 41    Number of Visits 44    Date for PT Re-Evaluation 08/21/21    Authorization Type [employer] MEDICARE reporting period from 07/09/2021    Progress Note Due on Visit 42    PT Start Time 1435    PT Stop Time 1515    PT Time Calculation (min) 40 min    Equipment Utilized During Treatment Gait belt    Activity Tolerance Patient tolerated treatment well    Behavior During Therapy [facility] for tasks assessed/performed             Past Medical History:  Diagnosis Date   Benign prostatic hypertrophy with elevated PSA    per Dr. Jacqlyn Larsen   History of CT scan of brain 1992   normal   Hyperglycemia 01/2006   102   Hyperlipemia    Hypertension    Seizure disorder [facility])     Past Surgical History:  Procedure Laterality Date   ANTERIOR CERVICAL DECOMP/DISCECTOMY FUSION  05/2018   for myelopathy -C3/4 ([facility])   CARPAL TUNNEL RELEASE Right 06/2019   ([facility])   CATARACT EXTRACTION W/ INTRAOCULAR LENS IMPLANT Right 2012   [facility]   History of EEG  1985   normal   hospitalization  1981   observation for seizuare   TONSILLECTOMY     TRANSURETHRAL RESECTION OF PROSTATE  10/2005   [place] RESECTION OF PROSTATE  08/2017   (rpt) [facility]    There were no vitals filed for this visit.   Subjective Assessment - 08/18/21 1528     Subjective Patient reports he is feeling well overall but continues to have trouble with his R ankle and mobility. Carrying SPC. Denies falls. States his HEP takes a long time but is going well and he is doing it. He thought of some exercises  from his past HEP that he noticed were not in the current HEP.    Pertinent History Patient is a 86 y.o. male who presents to outpatient physical therapy with a referral for medical diagnosis difficulty balancing, lumbar DDD (degenerative disc disease) with request for balance therapy. This patient's chief complaints consist of discomfort of the B LE below the knee, pain in the low back with standing, and difficulty balancing in the setting of chronic R ankle pain, B hand neuropathy symptoms and history of cervical spine disorder with ACDF at C3-4 for myelopathy leading to the following functional deficits: cannot do what he wants to outside because he is afraid to fall, taking the puppy out, prolonged standing, household chores, community ambulation, grooming, climbing stairs, yard work, sleeping, fall risk, etc. .  Relevant past medical history and comorbidities include seizure disorder (controlled with meds), hx R ankle fracture (wears an air brace to prevent pain for years), ACDF (05/2018 for myelopathy), R  Carpal tunnel release (06/2019), BPH, symptoms of peripheral neuropathy at all limbs (fine motor skills in B UE affected), CTS, perfuse degenerative changes on lumbar and cervical spine imaging (see chart for details).    Limitations Standing;Walking;Sitting;Other (comment);House hold activities    Diagnostic tests Cervical spine  MRI report 11/29/2020: " IMPRESSION:  1. Neurofibroma of the left C2 nerve as seen previously, maximal  transverse diameter 11-12 mm.  2. Previous ACDF C3-4. Canal stenosis remains at this level with AP  diameter of 7 mm, without worsening. No actual cord compression.  3. Spondylosis and facet arthropathy with degenerative  anterolisthesis of C5-6 and C7-T1. Canal narrowing but no cord  compression.  4. Foraminal stenosis primarily due to facet osteophytes and  uncovertebral osteophytes that could cause neural compression  bilateral at C2-3, bilateral at C3-4, bilateral at C4-5,  bilateral  but left more than right at C5-6, bilateral but left more than right  at C6-7 and bilateral but left more than right at C7-T1." Lumbar spine MRI report 11/29/2020: "IMPRESSION:  1. Degenerative disc disease and degenerative facet disease  throughout the lumbar region which could be associated with back  pain. Thoracolumbar scoliotic curvature.  2. L2-3: Moderate central canal stenosis. Bilateral lateral recess  and foraminal stenosis left worse than right. Neural compression  could occur at this level, particularly on the left.  3. L3-4: Bilateral lateral recess and foraminal stenosis, right  worse than left. Neural compression could occur on either side,  particularly on the right.  4. L4-5: Bilateral lateral recess and foraminal stenosis, right more  than left. Neural compression could occur on either side,  particularly the right.  5. L5-S1: Bilateral foraminal narrowing with some potential to  affect either exiting L5 nerve."    Patient Stated Goals he would like to walk "like normal."    Currently in Pain? No/denies             OBJECTIVE   FOTO =  53  (08/14/2021)  TREATMENT: Shoes/R ankle brace doffed until exiting clinic.   Manual Therapy: to increase joint mobility of the ankle to support increased overall ROM and provide relief to ankle musculature. SUPINE POSITION: R ankle - Supine subtalar medial glide in order to increase eversion of the R ankle, Grades III-IV, 2x30 seconds.  - Supine subtalar lateral glide in order to increase inversion of the R ankle, Grades III-IV, 2x30 seconds. - Supine talocural distraction to R ankle, 2x30 seconds to increase overall ROM  - Supine talocrural posterior and anterior glide, grade III-IV, 2x30 seconds each way   Neuromuscular Re-education: to improve, balance, postural strength, muscle activation patterns, and stabilization strength required for functional activities (CGA - min A as needed for safety): - 6 inch hurdles (8 hurdles):  forward step-to 6x8 hurdles with step through/step-to gait as able, CGA for safety.  - forward sway on airex pad with BUE touch down support as needed, 1x10. SBA for safety.  - forward and side mini-lunges/push offs (does not do well with purposeful lunge but is able to do step and push back much better). Practiced many times with cuing for improved form (less lunge) and discussion of how to do safely at home. SBA and intermittent UE support as needed for safety.    Therapeutic exercise: to centralize symptoms and improve ROM, strength, muscular endurance, and activity tolerance required for successful completion of functional activities.  - R ankle circles in figure 4 position, 1x30 each direction    Pt required multimodal cuing for proper technique and to facilitate improved neuromuscular control, strength, range of motion, and functional ability resulting in improved performance and form.   HOME EXERCISE PROGRAM Access Code: [email or web address] URL: [email or web address] Date: 08/18/2021 Prepared by: Rosita Kea  Exercises Half Tandem Stance Balance with Head Rotation -  2-3 x weekly - 1 sets - 20 reps Seated Ankle Plantar Flexion with Resistance Loop - 1 x daily - 3 sets - 10 reps Standing Heel Raises - 1 x daily - 3 sets - 10 reps Standing Gastroc Stretch at Counter - 1 x daily - 3 reps - 30 seconds hold Runner's Climb - 3 sets - 10 reps Straight Leg Raise Sciatic Nerve Flossing - 1 x daily - 7 x weekly - 3 sets - 10 reps - 1 hold Seated Ankle Eversion with Resistance - 1 x daily - 3 sets - 10 reps Isometric Ankle Inversion - 1 sets - 20 reps - 5 seconds hold Seated Anterior Tibialis Stretch - 1 x daily - 3 reps - 30 seconds hold Seated Heel Raise - 3 sets - 10 reps Lateral Inside Outside with Agility Ladder Forward Shuffle- Two Feet In, Two Feet Out with Agility Ladder Sit to Stand with Arm Reach and Jump - 3 x weekly - 3 sets - 10 reps Side Lunge with Counter Support - 3 x  weekly - 2 sets - 20 reps Standing Alternating Partial Lunge - 3 x weekly - 2 sets - 10-20 reps   [email or web address] [id number]  [[id number]]   ANKLE CIRCLES -  Repeat 20 Times, Hold 1 Second(s), Complete 3 Sets, Perform 1 Times a Day   CUPS - ALTERNATE TOE TAPS -  Repeat 10 Times, Hold 1 Second(s), Complete 3 Sets, Perform 3 Times a Week     PT Education - 08/18/21 1533     Education Details form/technique with exercise, POC,    Person(s) Educated Patient    Methods Explanation;Demonstration;Tactile cues;Verbal cues;Handout    Comprehension Verbalized understanding;Returned demonstration;Verbal cues required;Tactile cues required;Need further instruction              PT Short Term Goals - 04/15/21 1000       PT SHORT TERM GOAL #1   Title Be independent with initial home exercise program for self-management of symptoms.    Baseline Initial HEP to be established at visit 2 as appropriate (03/13/2021); Initial HEP established visit 2 (03/18/2021);    Time 3    Period Weeks    Status Achieved    Target Date 04/03/21               PT Long Term Goals - 08/14/21 1341       PT LONG TERM GOAL #1   Title Be independent with a long-term home exercise program for self-management of symptoms.    Baseline Initial HEP to be established at visit 2 as appropriate (03/13/2021); Initial HEP provided visit 2 (03/18/2021); continues to participate in appropriate HEP (05/29/2021); continues to participate in HEP (07/09/2021); continues to participate in HEP, updated long term HEP provided (08/14/2021);    Time 12    Period Weeks    Status Partially Met   TARGET DATE FOR ALL LONG TERM GOALS: 06/01/2021, updated to 08/21/2021 for unmet goals     PT LONG TERM GOAL #2   Title Demonstrate improved FOTO score by 10 units to demonstrate improvement in overall condition and self-reported functional ability.    Baseline to be measured visit 2 as appropriate (03/13/2021); 51 (03/18/2021); 51 (04/15/21): 54 (05/29/2021); 58  (07/09/2021); 53 (08/14/2021);    Time 12    Period Weeks    Status Partially Met      PT LONG TERM GOAL #3   Title Patient will score equal or greater than 25/30 on Functional Gait  Assessment to demonstrate reduction in fall risk to low fall risk classification.    Baseline to be measured vsiti 2 as appropriate (03/13/2021); 20/30 moderate fall risk (03/18/2021); 23/30 (05/29/2021); 22/30 (07/09/2021): 21/30 (08/14/2021);    Time 12    Period Weeks    Status On-going      PT LONG TERM GOAL #4   Title Patient will ambulate equal or greater than 1000 feet during 6 minute walk test to demonstrate improved community mobility.    Baseline to be measured vsiti 2 as appropriate (03/13/2021); 820 feet with SPC(03/18/2021); 1275f, 4/10 back pain peak that resolves upon rest (04/15/21); 1000 feet with increasing B SIJ pain (05/29/2021); 1100 feet with increasing B low back and R ankle pain (07/09/2021); 1162 feet with B low back and ankle pain (05/14/2021);    Time 12    Period Weeks    Status Achieved      PT LONG TERM GOAL #5   Title Complete community, work and/or recreational activities with 50% less limitation due to current condition.    Baseline Functional Limitations: difficulty with activities that require standing balance including outside activities, taking the puppy out, prolonged standing, household chores, community ambulation, grooming, climbing stairs, yard work, sleeping, fall risk, etc. He states he cannot do what he wants to outside because he is afraid to fall. He can do what he wants down on his knees, but not standing up. He has a new puppy and it is hard to keep his balance when she is pulling him (03/13/2021); states the dog doesn't pull him off balance any more and he was able to trim trees with the extended trimmer, back pain in the morning is better (05/08/2021); Patient reports difficulty with prolonged walking and going upstairs. States that his legs fatigue limits his ability to performing  these tasks. Patient reports goal of walking dogs on outside uneven trails (07/09/2021); reports some improvement form initial eval, feels stronger and less imbalance, but continues to have dififculty with similar problems (08/14/2021);    Time 12    Period Weeks    Status Partially Met                   Plan - 08/18/21 1539     Clinical Impression Statement Patient tolerated treatment well overall but continues to have difficulty with dynamic balance and mobility related to unsteadiness, ataxia, and right ankle pain. Session focused on continued balance training and further refinement of HEP. He needed additional cuing for safe step-push back exercises and would benefit from further review at next session. Plan to discharge from PT at next session. Patient would benefit from continued management of limiting condition by skilled physical therapist to address remaining impairments and functional limitations to work towards stated goals and return to PLOF or maximal functional independence.    Personal Factors and Comorbidities Age;Comorbidity 3+;Past/Current Experience;Fitness;Time since onset of injury/illness/exacerbation    Comorbidities Relevant past medical history and comorbidities include seizure disorder (controlled with meds), hx R ankle fracture (wears an air brace to prevent pain for years), ACDF (05/2018 for myelopathy), R  Carpal tunnel release (06/2019), BPH, symptoms of peripheral neuropathy at all limbs (fine motor skills in B UE affected), CTS, perfuse degenerative changes on lumbar and cervical spine imaging (see chart for details).    Examination-Activity Limitations Bend;Locomotion Level;Stand;Carry;Sleep;Hygiene/Grooming;Caring for Others;Lift;Stairs;Squat    Examination-Participation Restrictions Shop;Community Activity;Meal Prep;Yard Work    SProducer, television/film/video  PT Frequency 2x / week    PT Duration 12  weeks    PT Treatment/Interventions ADLs/Self Care Home Management;Cryotherapy;Traction;Moist Heat;Electrical Stimulation;DME Instruction;Gait training;Stair training;Functional mobility training;Therapeutic activities;Therapeutic exercise;Balance training;Neuromuscular re-education;Patient/family education;Passive range of motion;Dry needling;Manual techniques;Joint Manipulations;Spinal Manipulations    PT Next Visit Plan refine HEP for long term participation and discharge next visit    PT [place] Access Code: [email or web address]    Consulted and Agree with Plan of Care Patient             Patient will benefit from skilled therapeutic intervention in order to improve the following deficits and impairments:  Abnormal gait, Decreased knowledge of use of DME, Impaired sensation, Improper body mechanics, Pain, Decreased coordination, Decreased mobility, Postural dysfunction, Decreased activity tolerance, Decreased endurance, Decreased range of motion, Decreased strength, Hypomobility, Impaired perceived functional ability, Impaired UE functional use, Decreased balance, Difficulty walking, Impaired flexibility  Visit Diagnosis: Chronic bilateral low back pain, unspecified whether sciatica present  Unsteadiness on feet  History of falling  Difficulty in walking, not elsewhere classified     Problem List Patient Active Problem List   Diagnosis Date Noted   Hearing loss due to cerumen impaction 03/01/2021   Atypical chest pain 07/10/2020   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   Unsteadiness on feet 11/08/2019   History of spinal surgery 01/17/2019   Neck pain 01/17/2019   Weakness of hand 01/17/2019   Benign prostatic hyperplasia with urinary obstruction 27/05/2375   Systolic murmur 28/31/5176   Right carpal tunnel syndrome 02/02/2018   Ulnar neuropathy at elbow, left 02/02/2018   Peripheral neuropathy 01/26/2018   Bilateral buttock pain 06/29/2016   Failed vision screen  06/29/2016   Advanced care planning/counseling discussion 06/21/2015   Medicare annual wellness visit, subsequent 06/18/2014   Health maintenance examination 06/18/2014   Chronic ankle pain 06/18/2014   Incomplete emptying of bladder 06/07/2013   Vitamin D deficiency 05/20/2013   THYROID NODULE 05/17/2007   HLD (hyperlipidemia) 05/17/2007   TREMOR, ESSENTIAL 05/17/2007   Essential hypertension 05/17/2007   Cervical spondylosis with myelopathy and radiculopathy 05/17/2007   Seizure disorder ([place]) 05/17/2007    Everlean Alstrom. Graylon Good, PT, DPT 08/18/21, 3:40 PM   [facility] [address] [place], [address] Phone: [phone]   Fax:  [phone]  Name: Mark Holmes MRN: [id number] Date of Birth: [birth year]

## 2021-08-20 ENCOUNTER — Ambulatory Visit: Payer: Medicare Other

## 2021-08-20 DIAGNOSIS — G8929 Other chronic pain: Secondary | ICD-10-CM

## 2021-08-20 DIAGNOSIS — M545 Low back pain, unspecified: Secondary | ICD-10-CM

## 2021-08-20 DIAGNOSIS — R2681 Unsteadiness on feet: Secondary | ICD-10-CM

## 2021-08-20 DIAGNOSIS — R262 Difficulty in walking, not elsewhere classified: Secondary | ICD-10-CM

## 2021-08-20 NOTE — Therapy (Signed)
[facility] [address] [place], [address] Phone: [phone]   Fax:  [phone]  Physical Therapy Treatment/Discharge Summary  POC Dates: 03/13/2021 - 08/20/2021  Patient Details  Name: Mark Holmes MRN: [id number] Date of Birth: 1935/12/07 Referring Provider (PT): Allene Dillon, NP   Encounter Date: 08/20/2021   PT End of Session - 08/20/21 1430     Visit Number 42    Number of Visits 44    Date for PT Re-Evaluation 08/21/21    Authorization Type [employer] MEDICARE reporting period from 07/09/2021    Progress Note Due on Visit 63    PT Start Time 1426    PT Stop Time 1501    PT Time Calculation (min) 35 min    Equipment Utilized During Treatment Gait belt    Activity Tolerance Patient tolerated treatment well    Behavior During Therapy WFL for tasks assessed/performed             Past Medical History:  Diagnosis Date   Benign prostatic hypertrophy with elevated PSA    per Dr. Jacqlyn Larsen   History of CT scan of brain 1992   normal   Hyperglycemia 01/2006   102   Hyperlipemia    Hypertension    Seizure disorder [facility])     Past Surgical History:  Procedure Laterality Date   ANTERIOR CERVICAL DECOMP/DISCECTOMY FUSION  05/2018   for myelopathy -C3/4 ([facility])   CARPAL TUNNEL RELEASE Right 06/2019   ([facility])   CATARACT EXTRACTION W/ INTRAOCULAR LENS IMPLANT Right 2012   [facility]   History of EEG  1985   normal   hospitalization  1981   observation for seizuare   TONSILLECTOMY     TRANSURETHRAL RESECTION OF PROSTATE  10/2005   [place] RESECTION OF PROSTATE  08/2017   (rpt) [facility]    There were no vitals filed for this visit.   Subjective Assessment - 08/20/21 1428     Subjective Pt reports feeling very blessed to have had his primary PT. thankful for the help and exercises and is ready for D/c from therapy today. No falls or changes in meds. Feels like R  ankle exercises are improving.    Pertinent History Patient is a 86 y.o. male who presents to outpatient physical therapy with a referral for medical diagnosis difficulty balancing, lumbar DDD (degenerative disc disease) with request for balance therapy. This patient's chief complaints consist of discomfort of the B LE below the knee, pain in the low back with standing, and difficulty balancing in the setting of chronic R ankle pain, B hand neuropathy symptoms and history of cervical spine disorder with ACDF at C3-4 for myelopathy leading to the following functional deficits: cannot do what he wants to outside because he is afraid to fall, taking the puppy out, prolonged standing, household chores, community ambulation, grooming, climbing stairs, yard work, sleeping, fall risk, etc. .  Relevant past medical history and comorbidities include seizure disorder (controlled with meds), hx R ankle fracture (wears an air brace to prevent pain for years), ACDF (05/2018 for myelopathy), R  Carpal tunnel release (06/2019), BPH, symptoms of peripheral neuropathy at all limbs (fine motor skills in B UE affected), CTS, perfuse degenerative changes on lumbar and cervical spine imaging (see chart for details).    Limitations Standing;Walking;Sitting;Other (comment);House hold activities    Diagnostic tests Cervical spine MRI report 11/29/2020: " IMPRESSION:  1. Neurofibroma of the left  C2 nerve as seen previously, maximal  transverse diameter 11-12 mm.  2. Previous ACDF C3-4. Canal stenosis remains at this level with AP  diameter of 7 mm, without worsening. No actual cord compression.  3. Spondylosis and facet arthropathy with degenerative  anterolisthesis of C5-6 and C7-T1. Canal narrowing but no cord  compression.  4. Foraminal stenosis primarily due to facet osteophytes and  uncovertebral osteophytes that could cause neural compression  bilateral at C2-3, bilateral at C3-4, bilateral at C4-5, bilateral  but left more than  right at C5-6, bilateral but left more than right  at C6-7 and bilateral but left more than right at C7-T1." Lumbar spine MRI report 11/29/2020: "IMPRESSION:  1. Degenerative disc disease and degenerative facet disease  throughout the lumbar region which could be associated with back  pain. Thoracolumbar scoliotic curvature.  2. L2-3: Moderate central canal stenosis. Bilateral lateral recess  and foraminal stenosis left worse than right. Neural compression  could occur at this level, particularly on the left.  3. L3-4: Bilateral lateral recess and foraminal stenosis, right  worse than left. Neural compression could occur on either side,  particularly on the right.  4. L4-5: Bilateral lateral recess and foraminal stenosis, right more  than left. Neural compression could occur on either side,  particularly the right.  5. L5-S1: Bilateral foraminal narrowing with some potential to  affect either exiting L5 nerve."    Patient Stated Goals he would like to walk "like normal."    Currently in Pain? No/denies            Review of goals: Primary PT updated all long term goals on 08/14/2021 visit. Covering PT addressed any concerns with plateau towards long term goals. Remainder of session with focus on R ankle and BLE strength and mobility exercises.    There.ex:   Seated resisted hip flexion with RTB: 2x15/LE. Initial PT demo and min VC's to improve form/technique with reducing speed of exercise. Displays it is easier on L > R side. Pt reports LLe is stronger than RLE at baseline.   Standing step and push back lateral R/L and forwards with SUE to BUE support: 1x15/direction. CGA for safety. Pt remains limited stepping forward with LLE due to R ankle mobility dysfunction. PT educated pt on need for UE support to perform safely at home to reduce risk of falls.   Seated heel raises with eccentric lowering, bilat: 1x12  Reviewed R ankle resisted exercises in seated: DF, PF (gastroc biased and soleus biased),  eversion with RTB resisted.  R ankle circles for mobility in all planes: x5, CCW, CW Standing heel raises with R/L lat lean with BUE support for improved ankle strength and AROM: x5    Post session pt reports feeling comfortable with at home self management and being independent with exercises. After initial PT cuing, pt did display independence with good carryover not requiring further PT involvement.    PT Education - 08/20/21 1430     Education Details form/technique with exercise. Final touch ups on HEP exercises. D/c from PT.    Person(s) Educated Patient    Methods Explanation;Demonstration;Tactile cues;Verbal cues    Comprehension Verbalized understanding;Returned demonstration              PT Short Term Goals - 04/15/21 1000       PT SHORT TERM GOAL #1   Title Be independent with initial home exercise program for self-management of symptoms.    Baseline Initial HEP to be established at  visit 2 as appropriate (03/13/2021); Initial HEP established visit 2 (03/18/2021);    Time 3    Period Weeks    Status Achieved    Target Date 04/03/21               PT Long Term Goals - 08/20/21 1431       PT LONG TERM GOAL #1   Title Be independent with a long-term home exercise program for self-management of symptoms.    Baseline Initial HEP to be established at visit 2 as appropriate (03/13/2021); Initial HEP provided visit 2 (03/18/2021); continues to participate in appropriate HEP (05/29/2021); continues to participate in HEP (07/09/2021); continues to participate in HEP, updated long term HEP provided (08/14/2021); Pt independent with HEP.    Time 12    Period Weeks    Status Achieved   TARGET DATE FOR ALL LONG TERM GOALS: 06/01/2021, updated to 08/21/2021 for unmet goals   Target Date 08/20/21      PT LONG TERM GOAL #2   Title Demonstrate improved FOTO score by 10 units to demonstrate improvement in overall condition and self-reported functional ability.    Baseline to be  measured visit 2 as appropriate (03/13/2021); 51 (03/18/2021); 51 (04/15/21): 54 (05/29/2021); 58 (07/09/2021); 53 (08/14/2021);    Time 12    Period Weeks    Status Partially Met    Target Date 08/20/21      PT LONG TERM GOAL #3   Title Patient will score equal or greater than 25/30 on Functional Gait Assessment to demonstrate reduction in fall risk to low fall risk classification.    Baseline to be measured vsiti 2 as appropriate (03/13/2021); 20/30 moderate fall risk (03/18/2021); 23/30 (05/29/2021); 22/30 (07/09/2021): 21/30 (08/14/2021);    Time 12    Period Weeks    Status On-going    Target Date 08/20/21      PT LONG TERM GOAL #4   Title Patient will ambulate equal or greater than 1000 feet during 6 minute walk test to demonstrate improved community mobility.    Baseline to be measured vsiti 2 as appropriate (03/13/2021); 820 feet with SPC(03/18/2021); 1226f, 4/10 back pain peak that resolves upon rest (04/15/21); 1000 feet with increasing B SIJ pain (05/29/2021); 1100 feet with increasing B low back and R ankle pain (07/09/2021); 1162 feet with B low back and ankle pain (05/14/2021);    Time 12    Period Weeks    Status Achieved      PT LONG TERM GOAL #5   Title Complete community, work and/or recreational activities with 50% less limitation due to current condition.    Baseline Functional Limitations: difficulty with activities that require standing balance including outside activities, taking the puppy out, prolonged standing, household chores, community ambulation, grooming, climbing stairs, yard work, sleeping, fall risk, etc. He states he cannot do what he wants to outside because he is afraid to fall. He can do what he wants down on his knees, but not standing up. He has a new puppy and it is hard to keep his balance when she is pulling him (03/13/2021); states the dog doesn't pull him off balance any more and he was able to trim trees with the extended trimmer, back pain in the morning is better  (05/08/2021); Patient reports difficulty with prolonged walking and going upstairs. States that his legs fatigue limits his ability to performing these tasks. Patient reports goal of walking dogs on outside uneven trails (07/09/2021); reports some improvement form initial  eval, feels stronger and less imbalance, but continues to have dififculty with similar problems (08/14/2021);    Time 12    Period Weeks    Status Partially Met    Target Date 08/20/21                   Plan - 08/20/21 1523     Clinical Impression Statement Per documentation on 8/25, all long term goals were updated. Pt remains at a plateau in therapy and remains limited with R ankle dysfunction, ataxia, and peripheral neuropathy which remains the biggest limiter in pt's ability to progress in therapy per primary PT. Primary PT and pt have spent last two sessions with heavy focus on independence with at home management with HEP for self managment due to plateau. Today, PT finalizing exercises with focus on R ankle and BLE strength exercises. Pt displays good understanding of exercises and has no further questions with regards to HEP and being discharged at this time. Pt educated to contact PT for future questions, concerns, or needs and is discharged for PT for current POC.    Personal Factors and Comorbidities Age;Comorbidity 3+;Past/Current Experience;Fitness;Time since onset of injury/illness/exacerbation    Comorbidities Relevant past medical history and comorbidities include seizure disorder (controlled with meds), hx R ankle fracture (wears an air brace to prevent pain for years), ACDF (05/2018 for myelopathy), R  Carpal tunnel release (06/2019), BPH, symptoms of peripheral neuropathy at all limbs (fine motor skills in B UE affected), CTS, perfuse degenerative changes on lumbar and cervical spine imaging (see chart for details).    Examination-Activity Limitations Bend;Locomotion  Level;Stand;Carry;Sleep;Hygiene/Grooming;Caring for Others;Lift;Stairs;Squat    Examination-Participation Restrictions Shop;Community Activity;Meal Prep;Yard Work    Biomedical scientist Low    Rehab Potential Fair    PT Frequency 2x / week    PT Duration 12 weeks    PT Treatment/Interventions ADLs/Self Care Home Management;Cryotherapy;Traction;Moist Heat;Electrical Stimulation;DME Instruction;Gait training;Stair training;Functional mobility training;Therapeutic activities;Therapeutic exercise;Balance training;Neuromuscular re-education;Patient/family education;Passive range of motion;Dry needling;Manual techniques;Joint Manipulations;Spinal Manipulations    PT Next Visit Plan d/c from PT    PT Home Exercise Plan Medbridge Access Code: [email or web address]    Consulted and Agree with Plan of Care Patient             Patient will benefit from skilled therapeutic intervention in order to improve the following deficits and impairments:  Abnormal gait, Decreased knowledge of use of DME, Impaired sensation, Improper body mechanics, Pain, Decreased coordination, Decreased mobility, Postural dysfunction, Decreased activity tolerance, Decreased endurance, Decreased range of motion, Decreased strength, Hypomobility, Impaired perceived functional ability, Impaired UE functional use, Decreased balance, Difficulty walking, Impaired flexibility  Visit Diagnosis: Chronic bilateral low back pain, unspecified whether sciatica present  Unsteadiness on feet  Difficulty in walking, not elsewhere classified     Problem List Patient Active Problem List   Diagnosis Date Noted   Hearing loss due to cerumen impaction 03/01/2021   Atypical chest pain 07/10/2020   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   Unsteadiness on feet 11/08/2019   History of spinal surgery 01/17/2019   Neck pain 01/17/2019   Weakness of hand 01/17/2019   Benign prostatic  hyperplasia with urinary obstruction 27/05/2375   Systolic murmur 28/31/5176   Right carpal tunnel syndrome 02/02/2018   Ulnar neuropathy at elbow, left 02/02/2018   Peripheral neuropathy 01/26/2018   Bilateral buttock pain 06/29/2016   Failed vision screen 06/29/2016   Advanced care planning/counseling discussion 06/21/2015   Medicare  annual wellness visit, subsequent 06/18/2014   Health maintenance examination 06/18/2014   Chronic ankle pain 06/18/2014   Incomplete emptying of bladder 06/07/2013   Vitamin D deficiency 05/20/2013   THYROID NODULE 05/17/2007   HLD (hyperlipidemia) 05/17/2007   TREMOR, ESSENTIAL 05/17/2007   Essential hypertension 05/17/2007   Cervical spondylosis with myelopathy and radiculopathy 05/17/2007   Seizure disorder ([place]) 05/17/2007    Salem Caster. Fairly IV, PT, DPT Physical Therapist- [facility]  08/20/2021, 3:44 PM  [facility] [address], [place], [address] Phone: [phone]   Fax:  [phone]  Name: Mark Holmes MRN: [id number] Date of Birth: July 13, 1935

## 2021-09-18 ENCOUNTER — Other Ambulatory Visit: Payer: Self-pay | Admitting: Family Medicine

## 2021-10-13 ENCOUNTER — Other Ambulatory Visit: Payer: Self-pay | Admitting: Family Medicine

## 2021-10-18 ENCOUNTER — Other Ambulatory Visit: Payer: Self-pay | Admitting: Family Medicine

## 2021-10-18 DIAGNOSIS — G6289 Other specified polyneuropathies: Secondary | ICD-10-CM

## 2021-10-18 DIAGNOSIS — E559 Vitamin D deficiency, unspecified: Secondary | ICD-10-CM

## 2021-10-18 DIAGNOSIS — E785 Hyperlipidemia, unspecified: Secondary | ICD-10-CM

## 2021-10-18 DIAGNOSIS — G40909 Epilepsy, unspecified, not intractable, without status epilepticus: Secondary | ICD-10-CM

## 2021-10-20 ENCOUNTER — Encounter: Payer: Self-pay | Admitting: Family Medicine

## 2021-10-20 NOTE — Addendum Note (Signed)
Addended by: Ria Bush on: 10/20/2021 06:06 PM   Modules accepted: Orders

## 2021-10-22 ENCOUNTER — Telehealth: Payer: Self-pay | Admitting: Family Medicine

## 2021-10-22 MED ORDER — ATORVASTATIN CALCIUM 20 MG PO TABS: 20.0000 mg | ORAL_TABLET | Freq: Every day | ORAL | 0 refills | Status: DC

## 2021-10-22 NOTE — Telephone Encounter (Signed)
  Encourage patient to contact the pharmacy for refills or they can request refills through [facility]:  Please schedule appointment if longer than 1 year  NEXT APPOINTMENT DATE:10/24/21  MEDICATION:atorvastatin  Is the patient out of medication? yes  PHARMACY:[facility]  Let patient know to contact pharmacy at the end of the day to make sure medication is ready.  Please notify patient to allow 48-72 hours to process  CLINICAL FILLS OUT ALL BELOW:   LAST REFILL:  QTY:  REFILL DATE:    OTHER COMMENTS:    Okay for refill?  Please advise

## 2021-10-22 NOTE — Telephone Encounter (Signed)
E-scribed refill 

## 2021-10-24 ENCOUNTER — Other Ambulatory Visit: Payer: Medicare Other

## 2021-10-31 ENCOUNTER — Encounter: Payer: Medicare Other | Admitting: Family Medicine

## 2021-11-17 ENCOUNTER — Encounter: Payer: Self-pay | Admitting: Family Medicine

## 2021-11-17 ENCOUNTER — Other Ambulatory Visit: Payer: Self-pay

## 2021-11-17 ENCOUNTER — Ambulatory Visit (INDEPENDENT_AMBULATORY_CARE_PROVIDER_SITE_OTHER): Payer: Medicare Other | Admitting: Family Medicine

## 2021-11-17 VITALS — BP 118/62 | HR 60 | Temp 97.5°F | Ht 67.0 in | Wt 163.3 lb

## 2021-11-17 DIAGNOSIS — E559 Vitamin D deficiency, unspecified: Secondary | ICD-10-CM

## 2021-11-17 DIAGNOSIS — G250 Essential tremor: Secondary | ICD-10-CM

## 2021-11-17 DIAGNOSIS — I10 Essential (primary) hypertension: Secondary | ICD-10-CM

## 2021-11-17 DIAGNOSIS — G6289 Other specified polyneuropathies: Secondary | ICD-10-CM

## 2021-11-17 DIAGNOSIS — E785 Hyperlipidemia, unspecified: Secondary | ICD-10-CM

## 2021-11-17 DIAGNOSIS — R2681 Unsteadiness on feet: Secondary | ICD-10-CM

## 2021-11-17 DIAGNOSIS — R011 Cardiac murmur, unspecified: Secondary | ICD-10-CM

## 2021-11-17 DIAGNOSIS — Z0000 Encounter for general adult medical examination without abnormal findings: Secondary | ICD-10-CM

## 2021-11-17 DIAGNOSIS — G40909 Epilepsy, unspecified, not intractable, without status epilepticus: Secondary | ICD-10-CM

## 2021-11-17 DIAGNOSIS — N138 Other obstructive and reflux uropathy: Secondary | ICD-10-CM

## 2021-11-17 DIAGNOSIS — Z0101 Encounter for examination of eyes and vision with abnormal findings: Secondary | ICD-10-CM

## 2021-11-17 LAB — COMPREHENSIVE METABOLIC PANEL
ALT: 14 U/L (ref 0–53)
AST: 16 U/L (ref 0–37)
Albumin: 4.1 g/dL (ref 3.5–5.2)
Alkaline Phosphatase: 105 U/L (ref 39–117)
BUN: 28 mg/dL — ABNORMAL HIGH (ref 6–23)
CO2: 30 mEq/L (ref 19–32)
Calcium: 9.2 mg/dL (ref 8.4–10.5)
Chloride: 103 mEq/L (ref 96–112)
Creatinine, Ser: 1.32 mg/dL (ref 0.40–1.50)
GFR: 49.69 mL/min — ABNORMAL LOW (ref >60.00)
Glucose, Bld: 102 mg/dL — ABNORMAL HIGH (ref 70–99)
Potassium: 4.3 mEq/L (ref 3.5–5.1)
Sodium: 140 mEq/L (ref 135–145)
Total Bilirubin: 0.4 mg/dL (ref 0.2–1.2)
Total Protein: 6.6 g/dL (ref 6.0–8.3)

## 2021-11-17 LAB — LIPID PANEL
Cholesterol: 127 mg/dL (ref 0–200)
HDL: 34.3 mg/dL — ABNORMAL LOW (ref >39.00)
LDL Cholesterol: 69 mg/dL (ref 0–99)
NonHDL: 92.7
Total CHOL/HDL Ratio: 4
Triglycerides: 120 mg/dL (ref 0.0–149.0)
VLDL: 24 mg/dL (ref 0.0–40.0)

## 2021-11-17 LAB — VITAMIN D 25 HYDROXY (VIT D DEFICIENCY, FRACTURES): VITD: 34.32 ng/mL (ref 30.00–100.00)

## 2021-11-17 MED ORDER — RAMIPRIL 10 MG PO CAPS: 10.0000 mg | ORAL_CAPSULE | Freq: Every day | ORAL | 3 refills | Status: DC

## 2021-11-17 MED ORDER — ATORVASTATIN CALCIUM 20 MG PO TABS: 20.0000 mg | ORAL_TABLET | Freq: Every day | ORAL | 3 refills | Status: DC

## 2021-11-17 MED ORDER — TERAZOSIN HCL 10 MG PO CAPS: 10.0000 mg | ORAL_CAPSULE | Freq: Every day | ORAL | 3 refills | Status: DC

## 2021-11-17 MED ORDER — GABAPENTIN 300 MG PO CAPS: 300.0000 mg | ORAL_CAPSULE | Freq: Two times a day (BID) | ORAL | 3 refills | Status: DC

## 2021-11-17 MED ORDER — PHENYTOIN SODIUM EXTENDED 100 MG PO CAPS: ORAL_CAPSULE | ORAL | 3 refills | Status: DC

## 2021-11-17 NOTE — Progress Notes (Signed)
Patient ID: Mark Holmes, male    DOB: January 15, 1935, 86 y.o.   MRN: [id number]  This visit was conducted in person.  BP 118/62   Pulse 60   Temp (!) 97.5 F (36.4 C) (Temporal)   Ht 5\' 7"  (1.702 m)   Wt 163 lb 5 oz (74.1 kg)   SpO2 96%   BMI 25.58 kg/m   BP Readings from Last 3 Encounters:  11/17/21 118/62  06/30/21 110/60  02/26/21 122/60   CC: AMW/CPE Subjective:   HPI: Mark Holmes is a 86 y.o. male presenting on 11/17/2021 for Medicare Wellness   Did not see health advisor this year.   Hearing Screening   500Hz  1000Hz  2000Hz  4000Hz   Right ear 20 25 40 0  Left ear 40 25 0 0  Comments: Says wife notices pt's hearing is worsening.   Vision Screening   Right eye Left eye Both eyes  Without correction     With correction 20/70 20/40 20/40   Fails vision screen, wife notes worsening hearing as well - planning to see eye doctor. He doesn't note difficulty with hearing.  [facility] Office Visit from 10/22/2020 in [facility]  PHQ-2 Total Score 1       Fall Risk  11/17/2021 10/22/2020 10/08/2020 07/10/2019 07/04/2018  Falls in the past year? 1 0 0 0 No  Number falls in past yr: 1 - - - -  Injury with Fall? 0 - - - -    Not doing well today - depressed over neuropathy.   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 Also undergoing PT at [facility] with benefit. Continues gabapentin 300mg  bid and alpha lipoic acid - higher gabapentin caused sedation. Received epidural steroid injection. Notes worsening neuropathy.   Last PT 07/2021 - he continues HEP.   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  S/p R CTS release surgery 06/21/2019 - with persistent hand paresthesias.   Worsening mood related to periph neuropathy and associated limitations. + passive SI, but no active  plan, would not actively hurt himself. "Pleasant thoughts"   Preventative: Colon cancer screening - has not had colonoscopy before. Reassuring iFOBs previously. Decided to age out. no blood in stool or bowel changes.  Prostate - has seen uro (Dr. Jacqlyn Larsen at [facility] who has left) - last year established locally [facility] H/o BPH with elevated PSA s/p TURP on avodart and terazosin. Sees Dr Matilde Sprang yearly.  Lung cancer screening - not eligible  Flu shot - does not receive. [place] 12/2019, 01/2020, booster 08/2020  Pneumovax 2011, prevnar-13 2015. Tetanus shot Td 2011.   Shingrix - h/o shingles. Declines.  Advanced directives: has living will at home. HCPOA would be daughter. Would be ok with CPR and temporary trial of life support if reversible condition. Does not want prolonged life support. Does not want copy in our chart at this time - states daughter will be available. Seat belt use discussed.  Sunscreen use discussed. No changing moles.  Non smoker Alcohol - none  Dentist - every few years - due Eye exam - yearly  Bowel - no constipation Bladder - no incontinence  Caffeine: 1 cup/day, 2 excedrin/day   Married since 1959   2 daughters   Occupation: retired, was Banker)   Activity: walking dog, stays active with grandson (swimming)  Diet:  avoids carbs, good water, fruits/vegetables daily      Relevant past medical, surgical, family and social history reviewed and updated as indicated. Interim medical history since our last visit reviewed. Allergies and medications reviewed and updated. Outpatient Medications Prior to Visit  Medication Sig Dispense Refill   aspirin-acetaminophen-caffeine (EXCEDRIN MIGRAINE) 250-250-65 MG per tablet Take 1 tablet by mouth 2 (two) times daily as needed.      dutasteride (AVODART) 0.5 MG capsule TAKE 1 CAPSULE BY MOUTH  EVERY OTHER DAY 45 capsule 0   Pyridoxine HCl (VITAMIN B-6 PO) Take 100 mg by mouth daily.       atorvastatin (LIPITOR) 20 MG tablet Take 1 tablet (20 mg total) by mouth daily. 90 tablet 0   gabapentin (NEURONTIN) 300 MG capsule Take 1 capsule (300 mg total) by mouth 2 (two) times daily.     hydrochlorothiazide (MICROZIDE) 12.5 MG capsule TAKE 1 CAPSULE BY MOUTH  DAILY 90 capsule 0   phenytoin (DILANTIN) 100 MG ER capsule TAKE 2 CAPSULES BY MOUTH IN THE MORNING AND 3 CAPSULES  BY MOUTH AT NIGHT 450 capsule 3   ramipril (ALTACE) 10 MG capsule TAKE 1 CAPSULE BY MOUTH  DAILY 90 capsule 0   terazosin (HYTRIN) 10 MG capsule Take 1 capsule (10 mg total) by mouth daily. 90 capsule 3   No facility-administered medications prior to visit.     Per HPI unless specifically indicated in ROS section below Review of Systems  Constitutional:  Negative for activity change, appetite change, chills, fatigue, fever and unexpected weight change.  HENT:  Negative for hearing loss.   Eyes:  Negative for visual disturbance.  Respiratory:  Negative for cough, chest tightness, shortness of breath and wheezing.   Cardiovascular:  Negative for chest pain, palpitations and leg swelling.  Gastrointestinal:  Negative for abdominal distention, abdominal pain, blood in stool, constipation, diarrhea, nausea and vomiting.  Genitourinary:  Negative for difficulty urinating and hematuria.  Musculoskeletal:  Negative for arthralgias, myalgias and neck pain.  Skin:  Negative for rash.  Neurological:  Positive for light-headedness (orthostatic). Negative for dizziness, seizures, syncope and headaches.  Hematological:  Negative for adenopathy. Does not bruise/bleed easily.  Psychiatric/Behavioral:  Positive for dysphoric mood. The patient is not nervous/anxious.    Objective:  BP 118/62   Pulse 60   Temp (!) 97.5 F (36.4 C) (Temporal)   Ht 5\' 7"  (1.702 m)   Wt 163 lb 5 oz (74.1 kg)   SpO2 96%   BMI 25.58 kg/m   Wt Readings from Last 3 Encounters:  11/17/21 163 lb 5 oz (74.1 kg)  06/30/21 171 lb 1 oz (77.6 kg)   02/26/21 181 lb 6 oz (82.3 kg)      Physical Exam Vitals and nursing note reviewed.  Constitutional:      General: He is not in acute distress.    Appearance: Normal appearance. He is well-developed. He is not ill-appearing.  HENT:     Head: Normocephalic and atraumatic.     Right Ear: Hearing, tympanic membrane, ear canal and external ear normal.     Left Ear: Hearing, tympanic membrane, ear canal and external ear normal.  Eyes:     General: No scleral icterus.    Extraocular Movements: Extraocular movements intact.     Conjunctiva/sclera: Conjunctivae normal.     Pupils: Pupils are equal, round, and reactive to light.  Neck:     Thyroid: No thyroid mass or thyromegaly.     Vascular: No carotid bruit.  Cardiovascular:     Rate and Rhythm: Normal rate and regular rhythm.     Pulses: Normal pulses.          Radial pulses are 2+ on the right side and 2+ on the left side.     Heart sounds: Murmur (mild 2/6 systolic at RUSB) heard.  Pulmonary:     Effort: Pulmonary effort is normal. No respiratory distress.     Breath sounds: Normal breath sounds. No wheezing, rhonchi or rales.  Abdominal:     General: Bowel sounds are normal. There is no distension.     Palpations: Abdomen is soft. There is no mass.     Tenderness: There is no abdominal tenderness. There is no guarding or rebound.     Hernia: No hernia is present.  Musculoskeletal:        General: Normal range of motion.     Cervical back: Normal range of motion and neck supple.     Right lower leg: No edema.     Left lower leg: No edema.  Lymphadenopathy:     Cervical: No cervical adenopathy.  Skin:    General: Skin is warm and dry.     Findings: No rash.  Neurological:     General: No focal deficit present.     Mental Status: He is alert and oriented to person, place, and time.     Comments:  Recall 3/3 Calculation 5/5 DLROW  Psychiatric:        Mood and Affect: Mood normal.        Behavior: Behavior normal.         Thought Content: Thought content normal.        Judgment: Judgment normal.      Results for orders placed or performed during the hospital encounter of 07/10/20  SARS Coronavirus 2 by RT PCR (hospital order, performed in [place] hospital lab) Nasopharyngeal Nasopharyngeal Swab   Specimen: Nasopharyngeal Swab  Result Value Ref Range   SARS Coronavirus 2 NEGATIVE NEGATIVE  Basic metabolic panel  Result Value Ref Range   Sodium 139 135 - 145 mmol/L   Potassium 3.9 3.5 - 5.1 mmol/L   Chloride 105 98 - 111 mmol/L   CO2 26 22 - 32 mmol/L   Glucose, Bld 97 70 - 99 mg/dL   BUN 27 (H) 8 - 23 mg/dL   Creatinine, Ser 1.17 0.61 - 1.24 mg/dL   Calcium 9.2 8.9 - 10.3 mg/dL   GFR calc non Af Amer 58 (L) >60 mL/min   GFR calc Af Amer >60 >60 mL/min   Anion gap 8 5 - 15  CBC  Result Value Ref Range   WBC 7.1 4.0 - 10.5 K/uL   RBC 3.89 (L) 4.22 - 5.81 MIL/uL   Hemoglobin 11.6 (L) 13.0 - 17.0 g/dL   HCT 34.0 (L) 39.0 - 52.0 %   MCV 87.4 80.0 - 100.0 fL   MCH 29.8 26.0 - 34.0 pg   MCHC 34.1 30.0 - 36.0 g/dL   RDW 13.6 11.5 - 15.5 %   Platelets 236 150 - 400 K/uL   nRBC 0.0 0.0 - 0.2 %  Brain natriuretic peptide  Result Value Ref Range   B Natriuretic Peptide 42.8 0.0 - 100.0 pg/mL  Hepatic function panel  Result Value Ref Range   Total Protein 6.7 6.5 - 8.1 g/dL   Albumin 3.9 3.5 - 5.0 g/dL   AST 20 15 - 41 U/L   ALT 18 0 - 44  U/L   Alkaline Phosphatase 90 38 - 126 U/L   Total Bilirubin 0.7 0.3 - 1.2 mg/dL   Bilirubin, Direct <0.1 0.0 - 0.2 mg/dL   Indirect Bilirubin NOT CALCULATED 0.3 - 0.9 mg/dL  Lipid panel  Result Value Ref Range   Cholesterol 191 0 - 200 mg/dL   Triglycerides 145 <150 mg/dL   HDL 39 (L) >40 mg/dL   Total CHOL/HDL Ratio 4.9 RATIO   VLDL 29 0 - 40 mg/dL   LDL Cholesterol 123 (H) 0 - 99 mg/dL  Fibrin derivatives D-Dimer ([facility] only)  Result Value Ref Range   Fibrin derivatives D-dimer ([facility]) 751.97 (H) 0.00 - 499.00 ng/mL (FEU)  Phenytoin level, total   Result Value Ref Range   Phenytoin Lvl 14.1 10.0 - 20.0 ug/mL  Magnesium  Result Value Ref Range   Magnesium 2.1 1.7 - 2.4 mg/dL  Phosphorus  Result Value Ref Range   Phosphorus 3.5 2.5 - 4.6 mg/dL  CBC WITH DIFFERENTIAL  Result Value Ref Range   WBC 7.9 4.0 - 10.5 K/uL   RBC 3.89 (L) 4.22 - 5.81 MIL/uL   Hemoglobin 11.9 (L) 13.0 - 17.0 g/dL   HCT 33.0 (L) 39.0 - 52.0 %   MCV 84.8 80.0 - 100.0 fL   MCH 30.6 26.0 - 34.0 pg   MCHC 36.1 (H) 30.0 - 36.0 g/dL   RDW 13.5 11.5 - 15.5 %   Platelets 245 150 - 400 K/uL   nRBC 0.0 0.0 - 0.2 %   Neutrophils Relative % 68 %   Neutro Abs 5.3 1.7 - 7.7 K/uL   Lymphocytes Relative 20 %   Lymphs Abs 1.6 0.7 - 4.0 K/uL   Monocytes Relative 9 %   Monocytes Absolute 0.7 0.1 - 1.0 K/uL   Eosinophils Relative 2 %   Eosinophils Absolute 0.2 0.0 - 0.5 K/uL   Basophils Relative 1 %   Basophils Absolute 0.1 0.0 - 0.1 K/uL   Immature Granulocytes 0 %   Abs Immature Granulocytes 0.03 0.00 - 0.07 K/uL  TSH  Result Value Ref Range   TSH 0.431 0.350 - 4.500 uIU/mL  Comprehensive metabolic panel  Result Value Ref Range   Sodium 140 135 - 145 mmol/L   Potassium 3.6 3.5 - 5.1 mmol/L   Chloride 106 98 - 111 mmol/L   CO2 28 22 - 32 mmol/L   Glucose, Bld 85 70 - 99 mg/dL   BUN 26 (H) 8 - 23 mg/dL   Creatinine, Ser 1.02 0.61 - 1.24 mg/dL   Calcium 9.0 8.9 - 10.3 mg/dL   Total Protein 6.4 (L) 6.5 - 8.1 g/dL   Albumin 3.7 3.5 - 5.0 g/dL   AST 18 15 - 41 U/L   ALT 17 0 - 44 U/L   Alkaline Phosphatase 86 38 - 126 U/L   Total Bilirubin 0.7 0.3 - 1.2 mg/dL   GFR calc non Af Amer >60 >60 mL/min   GFR calc Af Amer >60 >60 mL/min   Anion gap 6 5 - 15  ECHOCARDIOGRAM COMPLETE  Result Value Ref Range   Weight 2,719.59 oz   Height 67 in   BP 134/70 mmHg   Ao pk vel 1.31 m/s   AV Area VTI 2.29 cm2   AR max vel 1.82 cm2   AV Mean grad 3.8 mmHg   AV Peak grad 6.8 mmHg   S' Lateral 2.57 cm   AV Area mean vel 1.69 cm2   Area-P 1/2 3.07 cm2  Troponin  I  (High Sensitivity)  Result Value Ref Range   Troponin I (High Sensitivity) 4 <18 ng/L  Troponin I (High Sensitivity)  Result Value Ref Range   Troponin I (High Sensitivity) 3 <18 ng/L   Depression screen [facility] 2/9 10/22/2020 07/10/2019 07/04/2018 07/02/2017 06/29/2016  Decreased Interest 1 0 0 0 0  Down, Depressed, Hopeless 0 0 0 0 0  PHQ - 2 Score 1 0 0 0 0   No flowsheet data found.  Assessment & Plan:  This visit occurred during the SARS-CoV-2 public health emergency.  Safety protocols were in place, including screening questions prior to the visit, additional usage of staff PPE, and extensive cleaning of exam room while observing appropriate contact time as indicated for disinfecting solutions.   Problem List Items Addressed This Visit     Medicare annual wellness visit, subsequent - Primary (Chronic)    I have personally reviewed the Medicare Annual Wellness questionnaire and have noted 1. The patient's medical and social history 2. Their use of alcohol, tobacco or illicit drugs 3. Their current medications and supplements 4. The patient's functional ability including ADL's, fall risks, home safety risks and hearing or visual impairment. Cognitive function has been assessed and addressed as indicated.  5. Diet and physical activity 6. Evidence for depression or mood disorders The patients weight, height, BMI have been recorded in the chart. I have made referrals, counseling and provided education to the patient based on review of the above and I have provided the pt with a written personalized care plan for preventive services. Provider list updated.. See scanned questionairre as needed for further documentation. Reviewed preventative protocols and updated unless pt declined.       Health maintenance examination (Chronic)    Preventative protocols reviewed and updated unless pt declined. Discussed healthy diet and lifestyle.       HLD (hyperlipidemia)    Chronic, continue  atorvastatin, update chol levels. The ASCVD Risk score (Arnett DK, et al., 2019) failed to calculate for the following reasons:   The 2019 ASCVD risk score is only valid for ages 84 to 61       Relevant Medications   ramipril (ALTACE) 10 MG capsule   terazosin (HYTRIN) 10 MG capsule   atorvastatin (LIPITOR) 20 MG tablet   Essential tremor   Essential hypertension    Chronic, stable on current regimen - continue Levels very stable and he desires to minimize medications - reasonable to trial off hctz.       Relevant Medications   ramipril (ALTACE) 10 MG capsule   terazosin (HYTRIN) 10 MG capsule   atorvastatin (LIPITOR) 20 MG tablet   Seizure disorder (HCC)    Longstanding on dilantin - to discuss possible change in treatment with neurology      Relevant Medications   gabapentin (NEURONTIN) 300 MG capsule   phenytoin (DILANTIN) 100 MG ER capsule   Vitamin D deficiency    Update levels off replacement.       Failed vision screen    Encouraged f/u with eye doctor.       Peripheral neuropathy    Encouraged continued f/u with neurology.  Though multifactorial with component of dilantin related neuropathy. Denies significant neuropathic pain.       Relevant Medications   gabapentin (NEURONTIN) 300 MG capsule   phenytoin (DILANTIN) 100 MG ER capsule   Systolic murmur    Mild, stable.       Benign prostatic hyperplasia with urinary obstruction  Regularly sees urology.       Unsteadiness on feet     Meds ordered this encounter  Medications   gabapentin (NEURONTIN) 300 MG capsule    Sig: Take 1 capsule (300 mg total) by mouth 2 (two) times daily.    Dispense:  180 capsule    Refill:  3   ramipril (ALTACE) 10 MG capsule    Sig: Take 1 capsule (10 mg total) by mouth daily.    Dispense:  90 capsule    Refill:  3   terazosin (HYTRIN) 10 MG capsule    Sig: Take 1 capsule (10 mg total) by mouth daily.    Dispense:  90 capsule    Refill:  3   phenytoin (DILANTIN)  100 MG ER capsule    Sig: TAKE 2 CAPSULES BY MOUTH IN THE MORNING AND 3 CAPSULES  BY MOUTH AT NIGHT    Dispense:  450 capsule    Refill:  3   atorvastatin (LIPITOR) 20 MG tablet    Sig: Take 1 tablet (20 mg total) by mouth daily.    Dispense:  90 tablet    Refill:  3    No orders of the defined types were placed in this encounter.   Patient instructions: Labs today  Call to schedule eye exam.  Call neurology Dr Manuella Ghazi to schedule follow up 12/2021 - ask about plans for dilantin, and whether there's a chance it will improve the neuropathy.  Try off hydrochlorothiazide, monitor blood pressures at home and let me know if starting to run high. Continue only ramipril for blood pressure.  Return as needed or in 6 months for follow up visit.   Follow up plan: Return in about 6 months (around 05/17/2022) for follow up visit.  Ria Bush, MD

## 2021-11-17 NOTE — Assessment & Plan Note (Addendum)
Encouraged continued f/u with neurology.  Though multifactorial with component of dilantin related neuropathy. Denies significant neuropathic pain.

## 2021-11-17 NOTE — Assessment & Plan Note (Signed)
Regularly sees urology.

## 2021-11-17 NOTE — Assessment & Plan Note (Addendum)
Chronic, stable on current regimen - continue Levels very stable and he desires to minimize medications - reasonable to trial off hctz.

## 2021-11-17 NOTE — Assessment & Plan Note (Signed)
Encouraged f/u with eye doctor.

## 2021-11-17 NOTE — Assessment & Plan Note (Signed)
Preventative protocols reviewed and updated unless pt declined. Discussed healthy diet and lifestyle.  

## 2021-11-17 NOTE — Addendum Note (Signed)
Addended by: Ellamae Sia on: 11/17/2021 11:15 AM   Modules accepted: Orders

## 2021-11-17 NOTE — Assessment & Plan Note (Signed)
Longstanding on dilantin - to discuss possible change in treatment with neurology

## 2021-11-17 NOTE — Assessment & Plan Note (Signed)
Chronic, continue atorvastatin, update chol levels. The ASCVD Risk score (Arnett DK, et al., 2019) failed to calculate for the following reasons:   The 2019 ASCVD risk score is only valid for ages 42 to 32

## 2021-11-17 NOTE — Assessment & Plan Note (Signed)

## 2021-11-17 NOTE — Patient Instructions (Addendum)
Labs today  Call to schedule eye exam.  Call neurology Dr Manuella Ghazi to schedule follow up 12/2021 - ask about plans for dilantin, and whether there's a chance it will improve the neuropathy.  Try off hydrochlorothiazide, monitor blood pressures at home and let me know if starting to run high. Continue only ramipril for blood pressure.  Return as needed or in 6 months for follow up visit.   Health Maintenance After Age 84 After age 4, you are at a higher risk for certain long-term diseases and infections as well as injuries from falls. Falls are a major cause of broken bones and head injuries in people who are older than age 83. Getting regular preventive care can help to keep you healthy and well. Preventive care includes getting regular testing and making lifestyle changes as recommended by your health care provider. Talk with your health care provider about: Which screenings and tests you should have. A screening is a test that checks for a disease when you have no symptoms. A diet and exercise plan that is right for you. What should I know about screenings and tests to prevent falls? Screening and testing are the best ways to find a health problem early. Early diagnosis and treatment give you the best chance of managing medical conditions that are common after age 34. Certain conditions and lifestyle choices may make you more likely to have a fall. Your health care provider may recommend: Regular vision checks. Poor vision and conditions such as cataracts can make you more likely to have a fall. If you wear glasses, make sure to get your prescription updated if your vision changes. Medicine review. Work with your health care provider to regularly review all of the medicines you are taking, including over-the-counter medicines. Ask your health care provider about any side effects that may make you more likely to have a fall. Tell your health care provider if any medicines that you take make you feel dizzy  or sleepy. Strength and balance checks. Your health care provider may recommend certain tests to check your strength and balance while standing, walking, or changing positions. Foot health exam. Foot pain and numbness, as well as not wearing proper footwear, can make you more likely to have a fall. Screenings, including: Osteoporosis screening. Osteoporosis is a condition that causes the bones to get weaker and break more easily. Blood pressure screening. Blood pressure changes and medicines to control blood pressure can make you feel dizzy. Depression screening. You may be more likely to have a fall if you have a fear of falling, feel depressed, or feel unable to do activities that you used to do. Alcohol use screening. Using too much alcohol can affect your balance and may make you more likely to have a fall. Follow these instructions at home: Lifestyle Do not drink alcohol if: Your health care provider tells you not to drink. If you drink alcohol: Limit how much you have to: 0-1 drink a day for women. 0-2 drinks a day for men. Know how much alcohol is in your drink. In the [place] one drink equals one 12 oz bottle of beer (355 mL), one 5 oz glass of wine (148 mL), or one 1 oz glass of hard liquor (44 mL). Do not use any products that contain nicotine or tobacco. These products include cigarettes, chewing tobacco, and vaping devices, such as e-cigarettes. If you need help quitting, ask your health care provider. Activity  Follow a regular exercise program to stay fit. This  will help you maintain your balance. Ask your health care provider what types of exercise are appropriate for you. If you need a cane or walker, use it as recommended by your health care provider. Wear supportive shoes that have nonskid soles. Safety  Remove any tripping hazards, such as rugs, cords, and clutter. Install safety equipment such as grab bars in bathrooms and safety rails on stairs. Keep rooms and walkways  well-lit. General instructions Talk with your health care provider about your risks for falling. Tell your health care provider if: You fall. Be sure to tell your health care provider about all falls, even ones that seem minor. You feel dizzy, tiredness (fatigue), or off-balance. Take over-the-counter and prescription medicines only as told by your health care provider. These include supplements. Eat a healthy diet and maintain a healthy weight. A healthy diet includes low-fat dairy products, low-fat (lean) meats, and fiber from whole grains, beans, and lots of fruits and vegetables. Stay current with your vaccines. Schedule regular health, dental, and eye exams. Summary Having a healthy lifestyle and getting preventive care can help to protect your health and wellness after age 49. Screening and testing are the best way to find a health problem early and help you avoid having a fall. Early diagnosis and treatment give you the best chance for managing medical conditions that are more common for people who are older than age 84. Falls are a major cause of broken bones and head injuries in people who are older than age 66. Take precautions to prevent a fall at home. Work with your health care provider to learn what changes you can make to improve your health and wellness and to prevent falls. This information is not intended to replace advice given to you by your health care provider. Make sure you discuss any questions you have with your health care provider. Document Revised: 04/28/2021 Document Reviewed: 04/28/2021 Elsevier Patient Education  [employer].

## 2021-11-17 NOTE — Assessment & Plan Note (Signed)
Mild, stable.  

## 2021-11-17 NOTE — Assessment & Plan Note (Signed)
Update levels off replacement. 

## 2021-11-18 LAB — PHENYTOIN LEVEL, TOTAL: Phenytoin, Total: 30.3 mg/L — ABNORMAL HIGH (ref 10.0–20.0)

## 2021-11-18 LAB — PSA, TOTAL WITH REFLEX TO PSA, FREE: PSA, Total: 0.6 ng/mL (ref <=4.0)

## 2022-01-06 ENCOUNTER — Other Ambulatory Visit: Payer: Self-pay | Admitting: Family Medicine

## 2022-01-07 ENCOUNTER — Encounter: Payer: Self-pay | Admitting: Ophthalmology

## 2022-01-12 NOTE — Discharge Instructions (Signed)

## 2022-01-14 ENCOUNTER — Other Ambulatory Visit: Payer: Self-pay | Admitting: Family Medicine

## 2022-01-14 ENCOUNTER — Encounter: Payer: Self-pay | Admitting: Ophthalmology

## 2022-01-14 ENCOUNTER — Other Ambulatory Visit: Payer: Self-pay

## 2022-01-14 ENCOUNTER — Ambulatory Visit: Payer: Medicare Other | Admitting: Anesthesiology

## 2022-01-14 ENCOUNTER — Ambulatory Visit
Admission: RE | Admit: 2022-01-14 | Discharge: 2022-01-14 | Disposition: A | Discharge status: Home / Self Care | Payer: Medicare Other | Source: Ambulatory Visit | Attending: Ophthalmology | Admitting: Ophthalmology

## 2022-01-14 ENCOUNTER — Encounter
Admission: RE | Disposition: A | Discharge status: Home / Self Care | Payer: Self-pay | Source: Ambulatory Visit | Attending: Ophthalmology

## 2022-01-14 DIAGNOSIS — G620 Drug-induced polyneuropathy: Secondary | ICD-10-CM

## 2022-01-14 DIAGNOSIS — H2512 Age-related nuclear cataract, left eye: Secondary | ICD-10-CM | POA: Diagnosis present

## 2022-01-14 DIAGNOSIS — Z79899 Other long term (current) drug therapy: Secondary | ICD-10-CM | POA: Insufficient documentation

## 2022-01-14 DIAGNOSIS — G40909 Epilepsy, unspecified, not intractable, without status epilepticus: Secondary | ICD-10-CM | POA: Diagnosis not present

## 2022-01-14 DIAGNOSIS — I10 Essential (primary) hypertension: Secondary | ICD-10-CM | POA: Insufficient documentation

## 2022-01-14 DIAGNOSIS — N400 Enlarged prostate without lower urinary tract symptoms: Secondary | ICD-10-CM | POA: Diagnosis not present

## 2022-01-14 DIAGNOSIS — E785 Hyperlipidemia, unspecified: Secondary | ICD-10-CM | POA: Diagnosis not present

## 2022-01-14 HISTORY — PX: CATARACT EXTRACTION W/PHACO: SHX586

## 2022-01-14 SURGERY — PHACOEMULSIFICATION, CATARACT, WITH IOL INSERTION
Anesthesia: Monitor Anesthesia Care | Site: Eye | Laterality: Left

## 2022-01-14 MED ORDER — ARMC OPHTHALMIC DILATING DROPS
1.0000 "application " | OPHTHALMIC | Status: DC | PRN
  Administered 2022-01-14 (×3): 1 via OPHTHALMIC

## 2022-01-14 MED ORDER — SIGHTPATH DOSE#1 BSS IO SOLN
INTRAOCULAR | Status: DC | PRN
Start: 2022-01-14 — End: 2022-01-14
  Administered 2022-01-14: 15 mL

## 2022-01-14 MED ORDER — MIDAZOLAM HCL 2 MG/2ML IJ SOLN
INTRAMUSCULAR | Status: DC | PRN
  Administered 2022-01-14: .5 mg via INTRAVENOUS

## 2022-01-14 MED ORDER — BRIMONIDINE TARTRATE-TIMOLOL 0.2-0.5 % OP SOLN
OPHTHALMIC | Status: DC | PRN
  Administered 2022-01-14: 1 [drp] via OPHTHALMIC

## 2022-01-14 MED ORDER — MOXIFLOXACIN HCL 0.5 % OP SOLN: OPHTHALMIC | Status: DC | PRN

## 2022-01-14 MED ORDER — SIGHTPATH DOSE#1 NA HYALUR & NA CHOND-NA HYALUR IO KIT
PACK | INTRAOCULAR | Status: DC | PRN
Start: 2022-01-14 — End: 2022-01-14
  Administered 2022-01-14: 1 via OPHTHALMIC

## 2022-01-14 MED ORDER — FENTANYL CITRATE (PF) 100 MCG/2ML IJ SOLN
INTRAMUSCULAR | Status: DC | PRN
Start: 2022-01-14 — End: 2022-01-14
  Administered 2022-01-14: 50 ug via INTRAVENOUS

## 2022-01-14 MED ORDER — TETRACAINE HCL 0.5 % OP SOLN
1.0000 [drp] | OPHTHALMIC | Status: DC | PRN
  Administered 2022-01-14 (×3): 1 [drp] via OPHTHALMIC

## 2022-01-14 MED ORDER — CEFUROXIME OPHTHALMIC INJECTION 1 MG/0.1 ML
INJECTION | OPHTHALMIC | Status: DC | PRN
Start: 2022-01-14 — End: 2022-01-14
  Administered 2022-01-14: 1 mg via OPHTHALMIC

## 2022-01-14 MED ORDER — SIGHTPATH DOSE#1 BSS IO SOLN
INTRAOCULAR | Status: DC | PRN
  Administered 2022-01-14: 1 mL via INTRAMUSCULAR

## 2022-01-14 MED ORDER — SIGHTPATH DOSE#1 BSS IO SOLN
INTRAOCULAR | Status: DC | PRN
  Administered 2022-01-14: 13:00:00 63 mL via OPHTHALMIC

## 2022-01-14 SURGICAL SUPPLY — 12 items
CATARACT SUITE SIGHTPATH (MISCELLANEOUS) ×2 IMPLANT
FEE CATARACT SUITE SIGHTPATH (MISCELLANEOUS) ×1 IMPLANT
GLOVE SRG 8 PF TXTR STRL LF DI (GLOVE) ×1 IMPLANT
GLOVE SURG ENC TEXT LTX SZ7.5 (GLOVE) ×2 IMPLANT
GLOVE SURG UNDER POLY LF SZ8 (GLOVE) ×2
LENS IOL ACRSF IQ ULTRA 17.0 (Intraocular Lens) IMPLANT
LENS IOL ACRYSOF IQ 17.0 (Intraocular Lens) ×2 IMPLANT
NDL FILTER BLUNT 18X1 1/2 (NEEDLE) ×1 IMPLANT
NEEDLE FILTER BLUNT 18X 1/2SAF (NEEDLE) ×1
NEEDLE FILTER BLUNT 18X1 1/2 (NEEDLE) ×1 IMPLANT
SYR 3ML LL SCALE MARK (SYRINGE) ×2 IMPLANT
WATER STERILE IRR 250ML POUR (IV SOLUTION) ×2 IMPLANT

## 2022-01-14 NOTE — Transfer of Care (Signed)
Immediate Anesthesia Transfer of Care Note  Patient: Mark Holmes  Procedure(s) Performed: CATARACT EXTRACTION PHACO AND INTRAOCULAR LENS PLACEMENT (IOC) LEFT (Left: Eye)  Patient Location: PACU  Anesthesia Type: MAC  Level of Consciousness: awake, alert  and patient cooperative  Airway and Oxygen Therapy: Patient Spontanous Breathing and Patient connected to supplemental oxygen  Post-op Assessment: Post-op Vital signs reviewed, Patient's Cardiovascular Status Stable, Respiratory Function Stable, Patent Airway and No signs of Nausea or vomiting  Post-op Vital Signs: Reviewed and stable  Complications: No notable events documented.

## 2022-01-14 NOTE — Op Note (Signed)
°  OPERATIVE NOTE  Mark Holmes [id number] 01/14/2022   PREOPERATIVE DIAGNOSIS:  Nuclear sclerotic cataract left eye. H25.12   POSTOPERATIVE DIAGNOSIS:    Nuclear sclerotic cataract left eye.     PROCEDURE:  Phacoemusification with posterior chamber intraocular lens placement of the left eye  Ultrasound time: Procedure(s) with comments: CATARACT EXTRACTION PHACO AND INTRAOCULAR LENS PLACEMENT (IOC) LEFT (Left) - 7.55 01:03.6  LENS:   Implant Name Type Inv. Item Serial No. Manufacturer Lot No. LRB No. Used Action  LENS IOL ACRYSOF IQ 17.0 - [id number] Intraocular Lens LENS IOL ACRYSOF IQ 17.0 [id number] [employer]  Left 1 Implanted      SURGEON:  Wyonia Hough, MD   ANESTHESIA:  Topical with tetracaine drops and 2% Xylocaine jelly, augmented with 1% preservative-free intracameral lidocaine.    COMPLICATIONS:  None.   DESCRIPTION OF PROCEDURE:  The patient was identified in the holding room and transported to the operating room and placed in the supine position under the operating microscope.  The left eye was identified as the operative eye and it was prepped and draped in the usual sterile ophthalmic fashion.   A 1 millimeter clear-corneal paracentesis was made at the 1:30 position.  0.5 ml of preservative-free 1% lidocaine was injected into the anterior chamber.  The anterior chamber was filled with Viscoat viscoelastic.  A 2.4 millimeter keratome was used to make a near-clear corneal incision at the 10:30 position.  .  A curvilinear capsulorrhexis was made with a cystotome and capsulorrhexis forceps.  Balanced salt solution was used to hydrodissect and hydrodelineate the nucleus.   Phacoemulsification was then used in stop and chop fashion to remove the lens nucleus and epinucleus.  The remaining cortex was then removed using the irrigation and aspiration handpiece. Provisc was then placed into the capsular bag to distend it for lens placement.  A lens was then  injected into the capsular bag.  The remaining viscoelastic was aspirated.   Wounds were hydrated with balanced salt solution.  The anterior chamber was inflated to a physiologic pressure with balanced salt solution.  No wound leaks were noted. Cefuroxime 0.1 ml of a 10mg /ml solution was injected into the anterior chamber for a dose of 1 mg of intracameral antibiotic at the completion of the case.   Timolol and Brimonidine drops were applied to the eye.  The patient was taken to the recovery room in stable condition without complications of anesthesia or surgery.  Mark Holmes 01/14/2022, 12:54 PM

## 2022-01-14 NOTE — H&P (Signed)
[place]   Primary Care Physician:  Ria Bush, MD Ophthalmologist: Dr. Leandrew Koyanagi  Pre-Procedure History & Physical: HPI:  Mark Holmes is a 87 y.o. male here for ophthalmic surgery.   Past Medical History:  Diagnosis Date   Benign prostatic hypertrophy with elevated PSA    per Dr. Jacqlyn Larsen   History of CT scan of brain 1992   normal   Hyperglycemia 01/2006   102   Hyperlipemia    Hypertension    Seizure disorder [facility])     Past Surgical History:  Procedure Laterality Date   ANTERIOR CERVICAL DECOMP/DISCECTOMY FUSION  05/2018   for myelopathy -C3/4 ([facility])   CARPAL TUNNEL RELEASE Right 06/2019   ([facility])   CATARACT EXTRACTION W/ INTRAOCULAR LENS IMPLANT Right 2012   [facility]   History of EEG  1985   normal   hospitalization  1981   observation for seizuare   TONSILLECTOMY     TRANSURETHRAL RESECTION OF PROSTATE  10/2005   [place] RESECTION OF PROSTATE  08/2017   (rpt) [facility]    Prior to Admission medications   Medication Sig Start Date End Date Taking? Authorizing Provider  aspirin-acetaminophen-caffeine (EXCEDRIN MIGRAINE) [phone] MG per tablet Take 1 tablet by mouth 2 (two) times daily as needed.    Yes [provider]  atorvastatin (LIPITOR) 20 MG tablet Take 1 tablet (20 mg total) by mouth daily. 11/17/21  Yes Ria Bush, MD  dutasteride (AVODART) 0.5 MG capsule TAKE 1 CAPSULE BY MOUTH EVERY  OTHER DAY 01/07/22  Yes Ria Bush, MD  gabapentin (NEURONTIN) 300 MG capsule Take 1 capsule (300 mg total) by mouth 2 (two) times daily. 11/17/21  Yes Ria Bush, MD  phenytoin (DILANTIN) 100 MG ER capsule TAKE 2 CAPSULES BY MOUTH IN THE MORNING AND 3 CAPSULES  BY MOUTH AT NIGHT 11/17/21  Yes Ria Bush, MD  Pyridoxine HCl (VITAMIN B-6 PO) Take 100 mg by mouth daily.    Yes [provider]  ramipril (ALTACE) 10 MG capsule Take 1 capsule (10 mg total) by mouth  daily. 11/17/21  Yes Ria Bush, MD  terazosin (HYTRIN) 10 MG capsule Take 1 capsule (10 mg total) by mouth daily. 11/17/21  Yes Ria Bush, MD    Allergies as of 12/12/2021 - Review Complete 11/17/2021  Allergen Reaction Noted   Dilaudid [hydromorphone hcl] Other (See Comments) 03/13/2012    Family History  Problem Relation Age of Onset   Febrile seizures Sister    Cancer Sister        lung/smoker   CAD Neg Hx     Social History   Socioeconomic History   Marital status: Married    Spouse name: Sport and exercise psychologist   Number of children: 2   Years of education: Not on file   Highest education level: Master's degree (e.g., MA, MS, MEng, MEd, MSW, MBA)  Occupational History   Occupation: [employer] 2001    Comment: Information systems manager  Tobacco Use   Smoking status: Never   Smokeless tobacco: Never  Vaping Use   Vaping Use: Never used  Substance and Sexual Activity   Alcohol use: No    Alcohol/week: 0.0 standard drinks   Drug use: No   Sexual activity: Yes  Other Topics Concern   Not on file  Social History Narrative   Caffeine: 1 cup/day, 2 excedrin/day   Married since 1959   2 daughters   Occupation: retired, was Banker)   Activity:  yardwork, stays active with grandson (swimming)   Diet: avoids carbs, good water, fruits/vegetables daily   Social Determinants of Health   Financial Resource Strain: Not on file  Food Insecurity: Not on file  Transportation Needs: Not on file  Physical Activity: Not on file  Stress: Not on file  Social Connections: Not on file  Intimate Partner Violence: Not on file    Review of Systems: See HPI, otherwise negative ROS  Physical Exam: BP 139/78    Pulse 62    Temp 97.7 F (36.5 C) (Temporal)    Resp 12    Ht 5\' 7"  (1.702 m)    Wt 75.4 kg    SpO2 98%    BMI 26.03 kg/m  General:   Alert,  pleasant and cooperative in NAD Head:  Normocephalic and atraumatic. Lungs:  Clear to auscultation.    Heart:  Regular  rate and rhythm.   Impression/Plan: Mark Holmes is here for ophthalmic surgery.  Risks, benefits, limitations, and alternatives regarding ophthalmic surgery have been reviewed with the patient.  Questions have been answered.  All parties agreeable.   Leandrew Koyanagi, MD  01/14/2022, 11:39 AM]

## 2022-01-14 NOTE — Anesthesia Preprocedure Evaluation (Signed)
Anesthesia Evaluation  Patient identified by MRN, date of birth, ID band Patient awake    Reviewed: Allergy & Precautions, H&P , NPO status , Patient's Chart, lab work & pertinent test results  Airway Mallampati: II  TM Distance: >3 FB Neck ROM: full    Dental no notable dental hx.    Pulmonary neg pulmonary ROS,    Pulmonary exam normal        Cardiovascular hypertension, On Medications Normal cardiovascular exam Rhythm:regular Rate:Normal     Neuro/Psych Seizures -, Well Controlled,  negative psych ROS   GI/Hepatic negative GI ROS, Neg liver ROS,   Endo/Other  negative endocrine ROS  Renal/GU negative Renal ROS     Musculoskeletal   Abdominal   Peds  Hematology negative hematology ROS (+)   Anesthesia Other Findings   Reproductive/Obstetrics                             Anesthesia Physical Anesthesia Plan  ASA: 3  Anesthesia Plan: MAC   Post-op Pain Management:    Induction:   PONV Risk Score and Plan: 1 and TIVA, Midazolam and Treatment may vary due to age or medical condition  Airway Management Planned:   Additional Equipment:   Intra-op Plan:   Post-operative Plan:   Informed Consent: I have reviewed the patients History and Physical, chart, labs and discussed the procedure including the risks, benefits and alternatives for the proposed anesthesia with the patient or authorized representative who has indicated his/her understanding and acceptance.       Plan Discussed with:   Anesthesia Plan Comments:         Anesthesia Quick Evaluation

## 2022-01-14 NOTE — Anesthesia Postprocedure Evaluation (Signed)
Anesthesia Post Note  Patient: Mark Holmes  Procedure(s) Performed: CATARACT EXTRACTION PHACO AND INTRAOCULAR LENS PLACEMENT (IOC) LEFT (Left: Eye)     Patient location during evaluation: PACU Anesthesia Type: MAC Level of consciousness: awake and alert Pain management: pain level controlled Vital Signs Assessment: post-procedure vital signs reviewed and stable Respiratory status: spontaneous breathing Cardiovascular status: stable Anesthetic complications: no   No notable events documented.  Gillian Scarce

## 2022-01-15 ENCOUNTER — Encounter: Payer: Self-pay | Admitting: Ophthalmology

## 2022-01-19 ENCOUNTER — Other Ambulatory Visit: Payer: Medicare Other

## 2022-01-20 ENCOUNTER — Other Ambulatory Visit (INDEPENDENT_AMBULATORY_CARE_PROVIDER_SITE_OTHER): Payer: Medicare Other

## 2022-01-20 ENCOUNTER — Other Ambulatory Visit: Payer: Self-pay

## 2022-01-20 DIAGNOSIS — T420X5A Adverse effect of hydantoin derivatives, initial encounter: Secondary | ICD-10-CM

## 2022-01-20 DIAGNOSIS — G620 Drug-induced polyneuropathy: Secondary | ICD-10-CM | POA: Diagnosis not present

## 2022-01-20 LAB — CBC WITH DIFFERENTIAL/PLATELET
Basophils Absolute: 0.1 10*3/uL (ref 0.0–0.1)
Basophils Relative: 0.9 % (ref 0.0–3.0)
Eosinophils Absolute: 0.2 10*3/uL (ref 0.0–0.7)
Eosinophils Relative: 2.7 % (ref 0.0–5.0)
HCT: 34.1 % — ABNORMAL LOW (ref 39.0–52.0)
Hemoglobin: 11.2 g/dL — ABNORMAL LOW (ref 13.0–17.0)
Lymphocytes Relative: 19.7 % (ref 12.0–46.0)
Lymphs Abs: 1.3 10*3/uL (ref 0.7–4.0)
MCHC: 33 g/dL (ref 30.0–36.0)
MCV: 89.1 fl (ref 78.0–100.0)
Monocytes Absolute: 0.6 10*3/uL (ref 0.1–1.0)
Monocytes Relative: 9.2 % (ref 3.0–12.0)
Neutro Abs: 4.3 10*3/uL (ref 1.4–7.7)
Neutrophils Relative %: 67.5 % (ref 43.0–77.0)
Platelets: 221 10*3/uL (ref 150.0–400.0)
RBC: 3.82 Mil/uL — ABNORMAL LOW (ref 4.22–5.81)
RDW: 13.5 % (ref 11.5–15.5)
WBC: 6.4 10*3/uL (ref 4.0–10.5)

## 2022-01-20 LAB — RENAL FUNCTION PANEL
Albumin: 3.7 g/dL (ref 3.5–5.2)
BUN: 22 mg/dL (ref 6–23)
CO2: 31 mEq/L (ref 19–32)
Calcium: 8.8 mg/dL (ref 8.4–10.5)
Chloride: 104 mEq/L (ref 96–112)
Creatinine, Ser: 1.11 mg/dL (ref 0.40–1.50)
GFR: 61.09 mL/min (ref >60.00)
Glucose, Bld: 87 mg/dL (ref 70–99)
Phosphorus: 3.6 mg/dL (ref 2.3–4.6)
Potassium: 4 mEq/L (ref 3.5–5.1)
Sodium: 140 mEq/L (ref 135–145)

## 2022-01-24 LAB — PHENYTOIN LEVEL, TOTAL: Phenytoin, Total: 18.9 mg/L (ref 10.0–20.0)

## 2022-01-24 LAB — TEST AUTHORIZATION

## 2022-01-24 LAB — PHENYTOIN LEVEL, FREE AND TOTAL: PHENYTOIN, FREE: 1.6 mg/L (ref 1.0–2.0)

## 2022-01-30 ENCOUNTER — Encounter: Payer: Self-pay | Admitting: Family Medicine

## 2022-02-02 ENCOUNTER — Encounter: Payer: Self-pay | Admitting: Urology

## 2022-02-02 ENCOUNTER — Ambulatory Visit (INDEPENDENT_AMBULATORY_CARE_PROVIDER_SITE_OTHER): Payer: Medicare Other | Admitting: Urology

## 2022-02-02 ENCOUNTER — Other Ambulatory Visit: Payer: Self-pay

## 2022-02-02 VITALS — BP 157/72 | HR 84 | Ht 67.0 in | Wt 166.0 lb

## 2022-02-02 DIAGNOSIS — R350 Frequency of micturition: Secondary | ICD-10-CM | POA: Diagnosis not present

## 2022-02-02 MED ORDER — FINASTERIDE 5 MG PO TABS: 5.0000 mg | ORAL_TABLET | Freq: Every day | ORAL | 3 refills | Status: DC

## 2022-02-02 NOTE — Progress Notes (Signed)
02/02/2022 8:38 AM   Mark Holmes 03-15-1935 [id number]  Referring provider: Ria Bush, MD [place],  [place] [id number]  No chief complaint on file.   HPI: Patient saw Dr. Chauncey Cruel 1 year ago for voiding dysfunction and had a TURP in 2018.  A prior biopsy for elevated PSA was normal.  In July 2019 PSA was 0.8.  BSA was 4.552 years ago and in July 2016 was 0.51   His main complaint is slow flow and he presses down with his abdominal muscles to strain.  He gets up 3 times a night.  He has no ankle edema.  He voids infrequently during the day.  He is still on Avodart.   Clinically not infected   50 g benign prostate with hemorrhoid and mild anal stenosis    Patient will continue to get his PSA by primary care though we talked about its usefulness in his age group.  I will represcribe the Avodart.  There is really nothing else that we can do for his poor flow and unlocked in order urodynamics.  He said 2 prostate procedures over the years.  We talked about the use of an overactive bladder medication for nighttime frequency but it could theoretically worsen his flow   Avodart 90 tablets and 3 refills sent and see in 1 year.  Unfortunately the patient lost his dog Johnny in November   Stable with good days and bad days no infection or blood 40 g benign prostate with some mild anal stenosis and see in 1 year on finasteride  Today Flow stable but better after a bowel movement.  No infection.  Frequency stable.  No blood in urine 40 to 50 g benign prostate   PMH: Past Medical History:  Diagnosis Date   Benign prostatic hypertrophy with elevated PSA    per Dr. Jacqlyn Larsen   History of CT scan of brain 1992   normal   Hyperglycemia 01/2006   102   Hyperlipemia    Hypertension    Seizure disorder [facility])     Surgical History: Past Surgical History:  Procedure Laterality Date   ANTERIOR CERVICAL DECOMP/DISCECTOMY FUSION  05/2018   for myelopathy -C3/4  ([facility] @ [facility])   CARPAL TUNNEL RELEASE Right 06/2019   ([facility])   CATARACT EXTRACTION W/ INTRAOCULAR LENS IMPLANT Right 2012   [facility]   CATARACT EXTRACTION W/PHACO Left 01/14/2022   Procedure: CATARACT EXTRACTION PHACO AND INTRAOCULAR LENS PLACEMENT ([place]) LEFT;  Surgeon: Leandrew Koyanagi, MD;  Location: [facility];  Service: Ophthalmology;  Laterality: Left;  7.55 01:03.6   History of EEG  1985   normal   hospitalization  1981   observation for seizuare   TONSILLECTOMY     TRANSURETHRAL RESECTION OF PROSTATE  10/2005   [name]   TRANSURETHRAL RESECTION OF PROSTATE  08/2017   (rpt) [facility]    Home Medications:  Allergies as of 02/02/2022       Reactions   Dilaudid [hydromorphone Hcl] Other (See Comments)   hallucinations        Medication List        Accurate as of February 02, 2022  8:38 AM. If you have any questions, ask your nurse or doctor.          aspirin-acetaminophen-caffeine 250-250-65 MG tablet Commonly known as: EXCEDRIN MIGRAINE Take 1 tablet by mouth 2 (two) times daily as needed.   atorvastatin 20 MG tablet Commonly known as: LIPITOR Take 1 tablet (20  mg total) by mouth daily.   dutasteride 0.5 MG capsule Commonly known as: AVODART TAKE 1 CAPSULE BY MOUTH EVERY  OTHER DAY   gabapentin 300 MG capsule Commonly known as: NEURONTIN Take 1 capsule (300 mg total) by mouth 2 (two) times daily.   phenytoin 100 MG ER capsule Commonly known as: DILANTIN TAKE 2 CAPSULES BY MOUTH IN THE MORNING AND 3 CAPSULES  BY MOUTH AT NIGHT   ramipril 10 MG capsule Commonly known as: ALTACE Take 1 capsule (10 mg total) by mouth daily.   terazosin 10 MG capsule Commonly known as: HYTRIN Take 1 capsule (10 mg total) by mouth daily.   VITAMIN B-6 PO Take 100 mg by mouth daily.        Allergies:  Allergies  Allergen Reactions   Dilaudid [Hydromorphone Hcl] Other (See Comments)    hallucinations    Family History: Family History   Problem Relation Age of Onset   Febrile seizures Sister    Cancer Sister        lung/smoker   CAD Neg Hx     Social History:  reports that he has never smoked. He has never used smokeless tobacco. He reports that he does not drink alcohol and does not use drugs.  ROS:                                        Physical Exam: There were no vitals taken for this visit.  Constitutional:  Alert and oriented, No acute distress. HEENT: [place] AT, moist mucus membranes.  Trachea midline, no masses.   Laboratory Data: Lab Results  Component Value Date   WBC 6.4 01/20/2022   HGB 11.2 (L) 01/20/2022   HCT 34.1 (L) 01/20/2022   MCV 89.1 01/20/2022   PLT 221.0 01/20/2022    Lab Results  Component Value Date   CREATININE 1.11 01/20/2022    Lab Results  Component Value Date   PSA 0.62 07/04/2020   PSA 0.51 07/06/2019   PSA 0.81 07/01/2018    No results found for: TESTOSTERONE  No results found for: HGBA1C  Urinalysis    Component Value Date/Time   APPEARANCEUR Clear 01/17/2019 1320   GLUCOSEU Negative 01/17/2019 1320   BILIRUBINUR Negative 01/17/2019 1320   PROTEINUR Negative 01/17/2019 1320   NITRITE Negative 01/17/2019 1320   LEUKOCYTESUR Negative 01/17/2019 1320    Pertinent Imaging:   Assessment & Plan: Finasteride 90x3 sent to pharmacy and I will see in 1 year.  They did get a new dog Bella   There are no diagnoses linked to this encounter.  No follow-ups on file.  Reece Packer, MD  [place] [address] [place], [place] [address] [phone]

## 2022-04-20 IMAGING — MR MR CERVICAL SPINE W/O CM
5 series · 35 of 48 positions shown · non-contrast
Comparison: 11/21/2019.

CLINICAL DATA: Cervical spine fusion 2 years ago. Numbness of both
hands and fingers. C1-2 mass.

EXAM:
MRI CERVICAL SPINE WITHOUT CONTRAST
TECHNIQUE: Multiplanar, multisequence MR imaging of the cervical spine was
performed. No intravenous contrast was administered.

[Series 5: T2 · sagittal · 3.0mm · 0.62mm/px · 6 of 15 slices shown (1 of 2)]
[im 1/15]
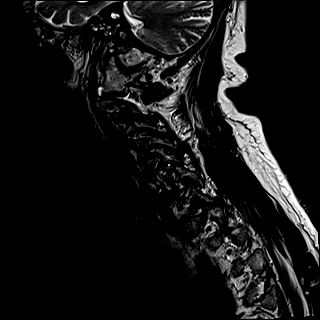
[im 3/15]
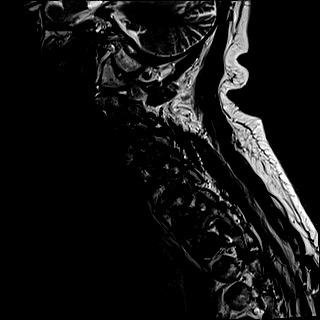
[im 6/15]
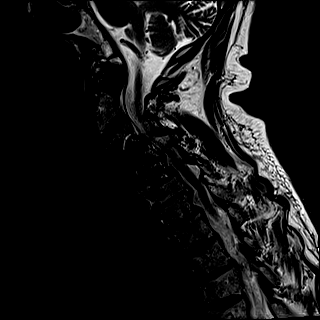
[im 9/15]
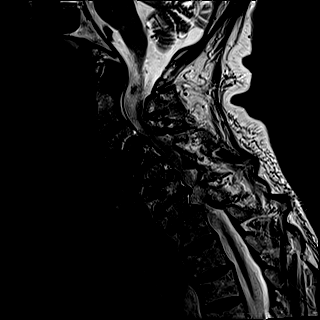
[im 12/15]
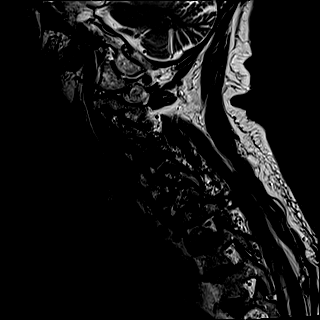
[im 15/15]
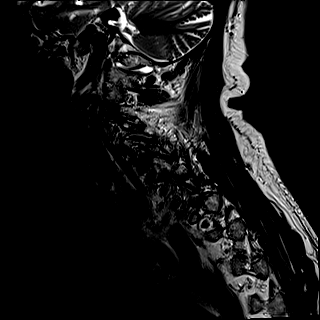

[Series 6: FLAIR · sagittal · 3.0mm · 0.78mm/px · 7 of 15 slices shown]
[im 1/15]
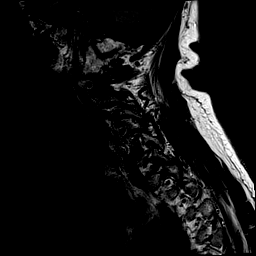
[im 3/15]
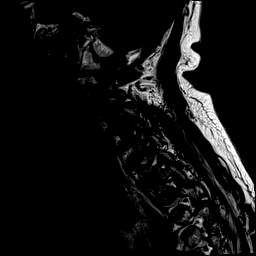
[im 5/15]
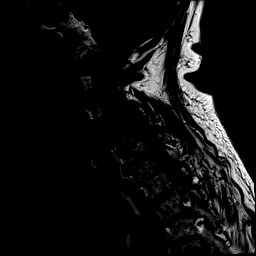
[im 8/15]
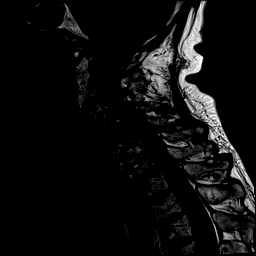
[im 10/15]
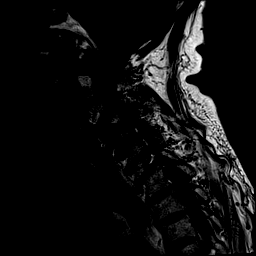
[im 12/15]
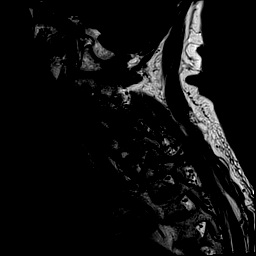
[im 15/15]
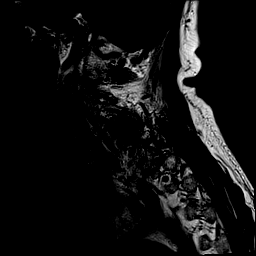

[Series 7: STIR · sagittal · 3.0mm · 0.62mm/px · 7 of 15 slices shown]
[im 1/15]
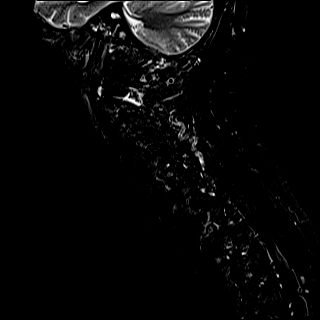
[im 3/15]
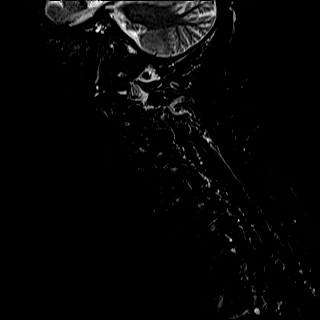
[im 5/15]
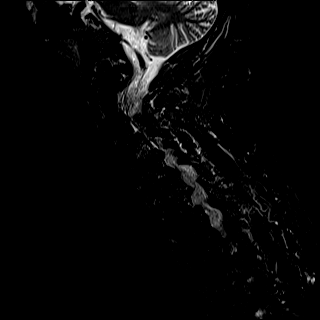
[im 8/15]
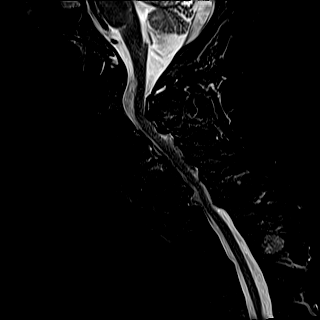
[im 10/15]
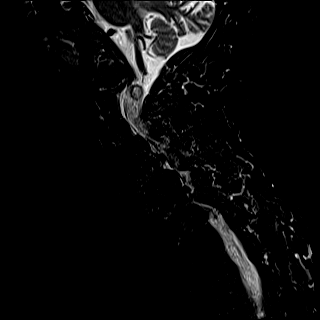
[im 12/15]
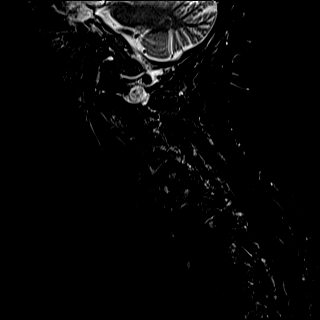
[im 15/15]
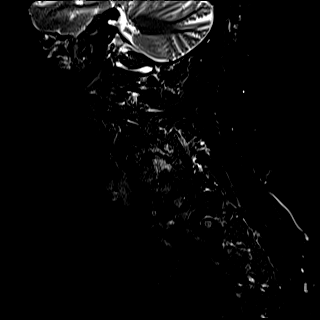

[Series 8: T2 · axial · 3.0mm · 0.70mm/px · z∈[-137,-40]mm · 8 of 31 slices shown (2 of 2)]
[im 1/31]
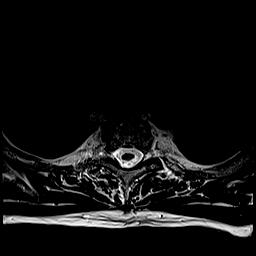
[im 5/31]
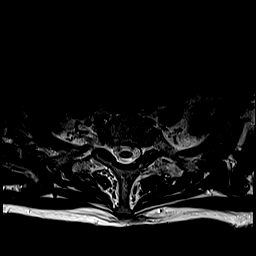
[im 10/31]
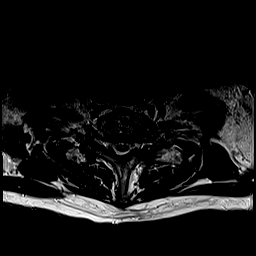
[im 14/31]
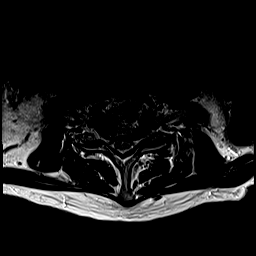
[im 17/31]
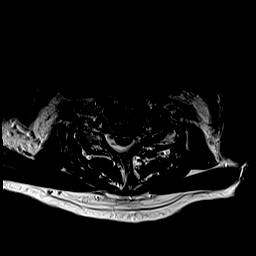
[im 21/31]
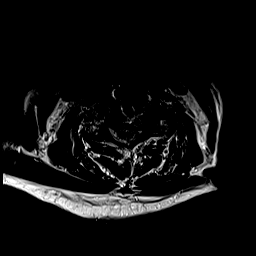
[im 26/31]
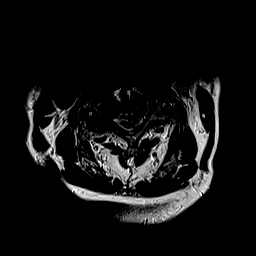
[im 31/31]
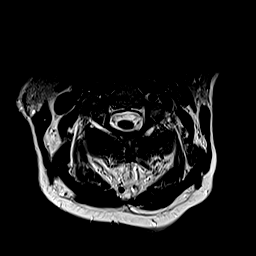

[Series 9: ax mpgr · axial · 3.0mm · 0.35mm/px · z∈[-137,-56]mm · 7 of 31 slices shown]
[im 1/31]
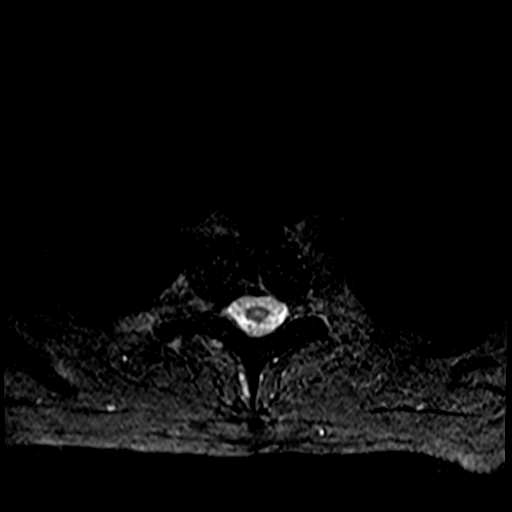
[im 5/31]
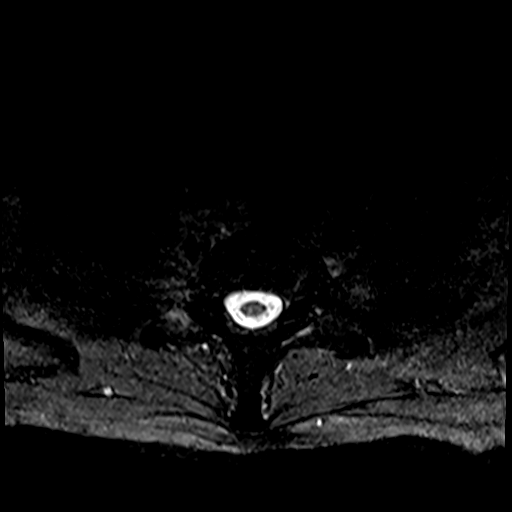
[im 10/31]
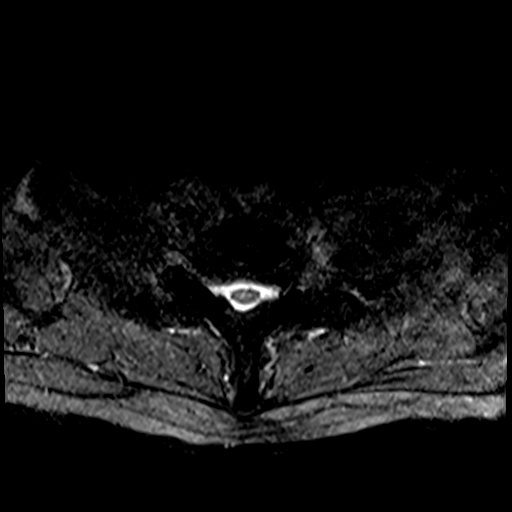
[im 14/31]
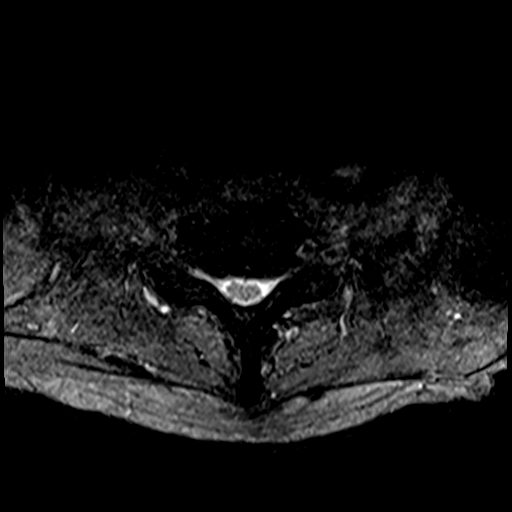
[im 17/31]
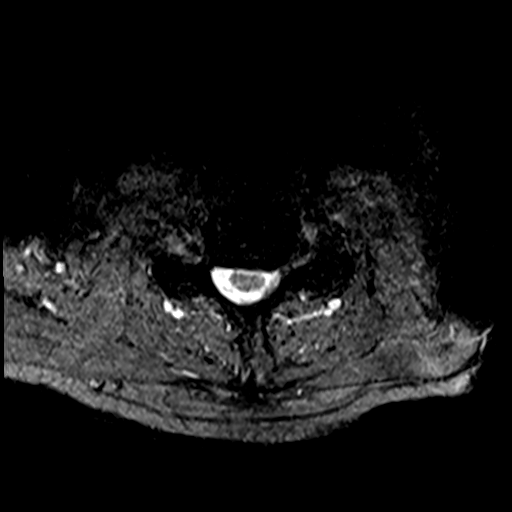
[im 21/31]
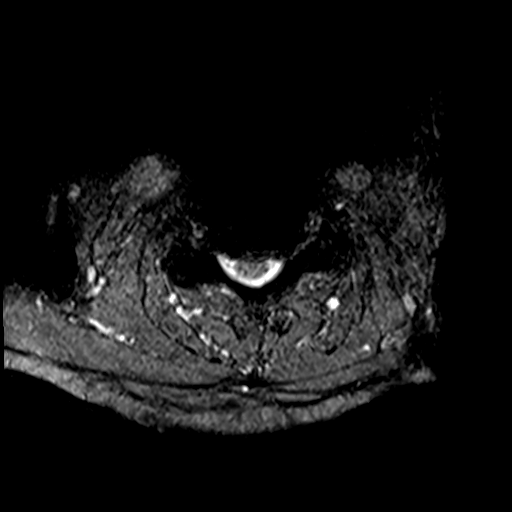
[im 26/31]
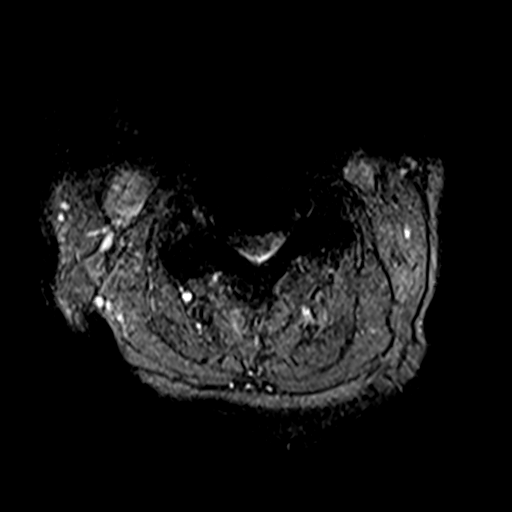

[35 of 48 positions shown; findings below may reference images not displayed]

FINDINGS: Alignment: Straightening of the normal cervical lordosis.
Degenerative anterolisthesis at C5-6 and C7-T1 of 3 mm.

Vertebrae: Distant ACDF C3-4. old minor superior endplate
deformities at T2 and T3.

Cord: No cord compression or primary cord lesion.

Posterior Fossa, vertebral arteries, paraspinal tissues: No soft
tissue neck lesion unassociated with the spine. See below regarding
C1-2 mass.

Disc levels:

Foramen magnum is widely patent. Ordinary osteoarthritis at the C1-2
articulation. Neurofibroma of the left C2 nerve as seen previously,
maximal transverse diameter 11-12 mm.

C2-3: Bilateral facet osteoarthritis. No compressive canal stenosis.
Bony foraminal stenosis right worse than left could affect either C3
nerve.

C3-4: Previous ACDF. No complicating feature identified. Ligamentous
hypertrophy. Canal stenosis persists at this level, with AP diameter
in the midline measuring 7 mm. No actual cord compression. Bilateral
foraminal stenosis because of osteophytic encroachment could affect
either C4 nerve.

C4-5: Disc degeneration with endplate osteophytes, bulging of the
disc. Bilateral facet degeneration and hypertrophy. No compressive
canal stenosis, AP diameter measuring 1 cm. Bilateral bony foraminal
narrowing could affect either C5 nerve.

C5-6: 3 mm degenerative anterolisthesis. Endplate osteophytes and
bulging of the disc. Mild facet and ligamentous degeneration and
hypertrophy on the right and moderate on the left. Canal narrowing
with AP diameter in the midline measuring 9 mm. No cord compression.
Bilateral foraminal stenosis left more than right. Neural
compression could occur on either side, particularly the left.

C6-7: Chronic disc degeneration with endplate osteophytes and
bulging of the disc. Minimal facet hypertrophy on the left. Canal
narrowing with AP diameter in the midline measuring 8.5 mm. No cord
compression. Mild bilateral foraminal narrowing, left more than
right.

C7-T1: Facet osteoarthritis with 3 mm of degenerative
anterolisthesis. Mild bulging of the disc. No compressive central
canal stenosis. Bilateral foraminal narrowing that could affect
either C8 nerve, left worse than right.

No apparent compressive pathology in the upper thoracic region.

No appreciable change since the study of 1 year ago.
IMPRESSION: 1. Neurofibroma of the left C2 nerve as seen previously, maximal
transverse diameter 11-12 mm.
2. Previous ACDF C3-4. Canal stenosis remains at this level with AP
diameter of 7 mm, without worsening. No actual cord compression.
3. Spondylosis and facet arthropathy with degenerative
anterolisthesis of C5-6 and C7-T1. Canal narrowing but no cord
compression.
4. Foraminal stenosis primarily due to facet osteophytes and
uncovertebral osteophytes that could cause neural compression
bilateral at C2-3, bilateral at C3-4, bilateral at C4-5, bilateral
but left more than right at C5-6, bilateral but left more than right
at C6-7 and bilateral but left more than right at C7-T1.

## 2022-04-30 ENCOUNTER — Encounter: Payer: Self-pay | Admitting: Family Medicine

## 2022-05-01 MED ORDER — B COMPLEX VITAMINS PO CAPS: 1.0000 | ORAL_CAPSULE | Freq: Every day | ORAL | Status: DC

## 2022-05-19 ENCOUNTER — Encounter: Payer: Self-pay | Admitting: Family Medicine

## 2022-05-19 ENCOUNTER — Ambulatory Visit: Payer: Medicare Other | Admitting: Family Medicine

## 2022-05-19 VITALS — BP 140/80 | HR 82 | Temp 97.5°F | Ht 67.0 in | Wt 176.5 lb

## 2022-05-19 DIAGNOSIS — R011 Cardiac murmur, unspecified: Secondary | ICD-10-CM | POA: Diagnosis not present

## 2022-05-19 DIAGNOSIS — M25571 Pain in right ankle and joints of right foot: Secondary | ICD-10-CM | POA: Diagnosis not present

## 2022-05-19 DIAGNOSIS — N138 Other obstructive and reflux uropathy: Secondary | ICD-10-CM

## 2022-05-19 DIAGNOSIS — G40909 Epilepsy, unspecified, not intractable, without status epilepticus: Secondary | ICD-10-CM | POA: Diagnosis not present

## 2022-05-19 DIAGNOSIS — N401 Enlarged prostate with lower urinary tract symptoms: Secondary | ICD-10-CM

## 2022-05-19 DIAGNOSIS — G6289 Other specified polyneuropathies: Secondary | ICD-10-CM

## 2022-05-19 DIAGNOSIS — G8929 Other chronic pain: Secondary | ICD-10-CM

## 2022-05-19 LAB — RENAL FUNCTION PANEL
Albumin: 3.2 g/dL — ABNORMAL LOW (ref 3.5–5.2)
BUN: 23 mg/dL (ref 6–23)
CO2: 27 mEq/L (ref 19–32)
Calcium: 8.6 mg/dL (ref 8.4–10.5)
Chloride: 106 mEq/L (ref 96–112)
Creatinine, Ser: 1.29 mg/dL (ref 0.40–1.50)
GFR: 50.9 mL/min — ABNORMAL LOW (ref >60.00)
Glucose, Bld: 98 mg/dL (ref 70–99)
Phosphorus: 3.9 mg/dL (ref 2.3–4.6)
Potassium: 5 mEq/L (ref 3.5–5.1)
Sodium: 141 mEq/L (ref 135–145)

## 2022-05-19 LAB — TSH: TSH: 0.95 u[IU]/mL (ref 0.35–5.50)

## 2022-05-19 LAB — VITAMIN B12: Vitamin B-12: >1504 pg/mL — ABNORMAL HIGH (ref 211–911)

## 2022-05-19 MED ORDER — ALPHA-LIPOIC ACID 600 MG PO TABS: 1.0000 | ORAL_TABLET | Freq: Every day | ORAL | Status: DC

## 2022-05-19 MED ORDER — DESIPRAMINE HCL 25 MG PO TABS
25.0000 mg | ORAL_TABLET | Freq: Every day | ORAL | 6 refills | Status: DC
Start: 2022-05-19 — End: 2022-11-02

## 2022-05-19 MED ORDER — PHENYTOIN SODIUM EXTENDED 100 MG PO CAPS: 200.0000 mg | ORAL_CAPSULE | Freq: Two times a day (BID) | ORAL | 1 refills | Status: DC

## 2022-05-19 NOTE — Progress Notes (Incomplete)
Patient ID: AABAN GRIEP, male    DOB: 09-Mar-1935, 87 y.o.   MRN: [id number]  This visit was conducted in person.  BP 140/80   Pulse 82   Temp (!) 97.5 F (36.4 C) (Temporal)   Ht '5\' 7"'$  (1.702 m)   Wt 176 lb 8 oz (80.1 kg)   SpO2 96%   BMI 27.64 kg/m    CC: 6 mo f/u visit  Subjective:   HPI: Mark Holmes is a 87 y.o. 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  Continues gabapentin 300 mg twice daily and alpha lipoic acid daily.  Gabapentin dose limited by sedation.  He is also received epidural steroid injections.  Last completed physical therapy August 2022.  Latest neurology note reviewed-considering switching Dilantin to lamotrigine as well as using scrambler therapy.  Notes ongoing struggle with periph neuropathy - bilateral legs feel tight/stiff "like leather" below the knees with any prolonged sitting/immobility. Notes burning to feet some nights. He treats this with excedrin and a sleeping pill. He recently started [place] 3-in-1 (B complex multivitamin) 3 weeks ago, no significant change noted.  Asks about rpt PT.   Requests new air-cast brace for chronic R ankle.   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   Saw urology earlier this year, plan continue finasteride with yearly urology follow-up.  He also had L cataract extraction ([place])     Relevant past medical, surgical, family and social history reviewed and updated as indicated. Interim medical history since our last visit reviewed. Allergies and medications reviewed and updated. Outpatient Medications Prior to Visit  Medication Sig Dispense Refill  . aspirin-acetaminophen-caffeine (EXCEDRIN MIGRAINE)  250-250-65 MG per tablet Take 1 tablet by mouth 2 (two) times daily as needed.     Marland Kitchen atorvastatin (LIPITOR) 20 MG tablet Take 1 tablet (20 mg total) by mouth daily. 90 tablet 3  . b complex vitamins capsule Take 1 capsule by mouth daily. [facility] 3 in 1    . finasteride (PROSCAR) 5 MG tablet Take 1 tablet (5 mg total) by mouth daily. 90 tablet 3  . gabapentin (NEURONTIN) 300 MG capsule Take 1 capsule (300 mg total) by mouth 2 (two) times daily. 180 capsule 3  . ramipril (ALTACE) 10 MG capsule Take 1 capsule (10 mg total) by mouth daily. 90 capsule 3  . terazosin (HYTRIN) 10 MG capsule Take 1 capsule (10 mg total) by mouth daily. 90 capsule 3  . Alpha-Lipoic Acid 600 MG TABS     . phenytoin (DILANTIN) 100 MG ER capsule TAKE 2 CAPSULES BY MOUTH IN THE MORNING AND 3 CAPSULES  BY MOUTH AT NIGHT 450 capsule 3   No facility-administered medications prior to visit.     Per HPI unless specifically indicated in ROS section below Review of Systems  Objective:  BP 140/80   Pulse 82   Temp (!) 97.5 F (36.4 C) (Temporal)   Ht '5\' 7"'$  (1.702 m)   Wt 176 lb 8 oz (80.1 kg)   SpO2 96%   BMI 27.64 kg/m   Wt Readings from Last 3 Encounters:  05/19/22 176 lb 8 oz (80.1 kg)  02/02/22 166 lb (75.3 kg)  01/14/22 166 lb 3.2 oz (75.4 kg)  Physical Exam Vitals and nursing note reviewed.  Constitutional:      Appearance: Normal appearance. He is not ill-appearing.  Cardiovascular:     Rate and Rhythm: Normal rate and regular rhythm.     Pulses: Normal pulses.     Heart sounds: Murmur (2/6 systolic murmur) heard.  Pulmonary:     Effort: Pulmonary effort is normal. No respiratory distress.     Breath sounds: Normal breath sounds. No wheezing, rhonchi or rales.  Musculoskeletal:     Right lower leg: No edema.     Left lower leg: No edema.     Comments: Air cast brace in place to R ankle  Skin:    General: Skin is warm and dry.     Findings: No rash.  Neurological:      Mental Status: He is alert.     Comments: 5/5 strength BLE  Psychiatric:        Mood and Affect: Mood normal.        Behavior: Behavior normal.      Results for orders placed or performed in visit on 05/19/22  TSH  Result Value Ref Range   TSH 0.95 0.35 - 5.50 uIU/mL  Renal function panel  Result Value Ref Range   Sodium 141 135 - 145 mEq/L   Potassium 5.0 3.5 - 5.1 mEq/L   Chloride 106 96 - 112 mEq/L   CO2 27 19 - 32 mEq/L   Albumin 3.2 (L) 3.5 - 5.2 g/dL   BUN 23 6 - 23 mg/dL   Creatinine, Ser 1.29 0.40 - 1.50 mg/dL   Glucose, Bld 98 70 - 99 mg/dL   Phosphorus 3.9 2.3 - 4.6 mg/dL   GFR 50.90 (L) >60.00 mL/min   Calcium 8.6 8.4 - 10.5 mg/dL  Vitamin B12  Result Value Ref Range   Vitamin B-12 >1504 (H) 211 - 911 pg/mL    Assessment & Plan:   Problem List Items Addressed This Visit     Peripheral neuropathy - Primary   Relevant Medications   phenytoin (DILANTIN) 100 MG ER capsule   desipramine (NORPRAMIN) 25 MG tablet   Other Relevant Orders   Phenytoin level, free and total   Serum protein electrophoresis with reflex   TSH (Completed)   Renal function panel (Completed)   Vitamin B1   Vitamin B12 (Completed)   Ambulatory referral to Physical Therapy     Meds ordered this encounter  Medications  . Alpha-Lipoic Acid 600 MG TABS    Sig: Take 1 tablet by mouth daily.  . phenytoin (DILANTIN) 100 MG ER capsule    Sig: Take 2 capsules (200 mg total) by mouth 2 (two) times daily.    Dispense:  360 capsule    Refill:  1  . desipramine (NORPRAMIN) 25 MG tablet    Sig: Take 1 tablet (25 mg total) by mouth at bedtime.    Dispense:  30 tablet    Refill:  6   Orders Placed This Encounter  Procedures  . Phenytoin level, free and total  . Serum protein electrophoresis with reflex  . TSH  . Renal function panel  . Vitamin B1  . Vitamin B12  . Ambulatory referral to Physical Therapy    Referral Priority:   Routine    Referral Type:   Physical Medicine    Referral  Reason:   Specialty Services Required    Requested Specialty:   Physical Therapy    Number of Visits Requested:   1  Patient Instructions  Air cast brace for right ankle.  Labs today We will refer you back to physical therapy in [place].  Trial desipramine '25mg'$  nightly for peripheral neuropathy symptoms.  Let us know how you do with this.  Return in 6 months for physical/wellness visit   Follow up plan: Return in about 6 months (around 11/19/2022), or if symptoms worsen or fail to improve, for annual exam, prior fasting for blood work, medicare wellness visit.  Ria Bush, MD

## 2022-05-19 NOTE — Patient Instructions (Addendum)
Air cast brace for right ankle.  Labs today We will refer you back to physical therapy in [place].  Trial desipramine '25mg'$  nightly for peripheral neuropathy symptoms.  Let us know how you do with this.  Return in 6 months for physical/wellness visit

## 2022-05-19 NOTE — Assessment & Plan Note (Signed)
Encouraged consider change in seizure treatment discussed by neuro given concern over dilantin -related neuropathy.

## 2022-05-19 NOTE — Progress Notes (Unsigned)
Patient ID: Mark Holmes, male    DOB: 1935/09/01, 87 y.o.   MRN: [id number]  This visit was conducted in person.  BP 140/80   Pulse 82   Temp (!) 97.5 F (36.4 C) (Temporal)   Ht '5\' 7"'$  (1.702 m)   Wt 176 lb 8 oz (80.1 kg)   SpO2 96%   BMI 27.64 kg/m    CC: 6 mo f/u visit  Subjective:   HPI: Mark Holmes is a 87 y.o. 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  Continues gabapentin 300 mg twice daily and alpha lipoic acid daily.  Gabapentin dose limited by sedation.  He is also received epidural steroid injections.  Last completed physical therapy August 2022.  Latest neurology note reviewed-considering switching Dilantin to lamotrigine as well as using scrambler therapy.  Notes ongoing struggle with periph neuropathy - bilateral legs feel tight/stiff "like leather" below the knees with any prolonged sitting/immobility. Notes burning to feet some nights. He treats this with excedrin and a sleeping pill. He recently started [place] 3-in-1 (B complex multivitamin) 3 weeks ago, no significant change noted.  Asks about rpt PT.   Requests new air-cast brace for chronic R ankle.   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   Saw urology earlier this year, plan continue finasteride with yearly urology follow-up.  He also had L cataract extraction ([place])     Relevant past medical, surgical, family and social history reviewed and updated as indicated. Interim medical history since our last visit reviewed. Allergies and medications reviewed and updated. Outpatient Medications Prior to Visit  Medication Sig Dispense Refill   aspirin-acetaminophen-caffeine (EXCEDRIN MIGRAINE)  250-250-65 MG per tablet Take 1 tablet by mouth 2 (two) times daily as needed.      atorvastatin (LIPITOR) 20 MG tablet Take 1 tablet (20 mg total) by mouth daily. 90 tablet 3   b complex vitamins capsule Take 1 capsule by mouth daily. [facility] 3 in 1     finasteride (PROSCAR) 5 MG tablet Take 1 tablet (5 mg total) by mouth daily. 90 tablet 3   gabapentin (NEURONTIN) 300 MG capsule Take 1 capsule (300 mg total) by mouth 2 (two) times daily. 180 capsule 3   ramipril (ALTACE) 10 MG capsule Take 1 capsule (10 mg total) by mouth daily. 90 capsule 3   terazosin (HYTRIN) 10 MG capsule Take 1 capsule (10 mg total) by mouth daily. 90 capsule 3   Alpha-Lipoic Acid 600 MG TABS      phenytoin (DILANTIN) 100 MG ER capsule TAKE 2 CAPSULES BY MOUTH IN THE MORNING AND 3 CAPSULES  BY MOUTH AT NIGHT 450 capsule 3   No facility-administered medications prior to visit.     Per HPI unless specifically indicated in ROS section below Review of Systems  Objective:  BP 140/80   Pulse 82   Temp (!) 97.5 F (36.4 C) (Temporal)   Ht '5\' 7"'$  (1.702 m)   Wt 176 lb 8 oz (80.1 kg)   SpO2 96%   BMI 27.64 kg/m   Wt Readings from Last 3 Encounters:  05/19/22 176 lb 8 oz (80.1 kg)  02/02/22 166 lb (75.3 kg)  01/14/22 166 lb 3.2 oz (75.4 kg)  Physical Exam Vitals and nursing note reviewed.  Constitutional:      Appearance: Normal appearance. He is not ill-appearing.  Cardiovascular:     Rate and Rhythm: Normal rate and regular rhythm.     Pulses: Normal pulses.     Heart sounds: Murmur (2/6 systolic murmur) heard.  Pulmonary:     Effort: Pulmonary effort is normal. No respiratory distress.     Breath sounds: Normal breath sounds. No wheezing, rhonchi or rales.  Musculoskeletal:     Right lower leg: No edema.     Left lower leg: No edema.     Comments: Air cast brace in place to R ankle  Skin:    General: Skin is warm and dry.     Findings: No rash.  Neurological:     Mental  Status: He is alert.     Comments: 5/5 strength BLE  Psychiatric:        Mood and Affect: Mood normal.        Behavior: Behavior normal.      Results for orders placed or performed in visit on 05/19/22  TSH  Result Value Ref Range   TSH 0.95 0.35 - 5.50 uIU/mL  Renal function panel  Result Value Ref Range   Sodium 141 135 - 145 mEq/L   Potassium 5.0 3.5 - 5.1 mEq/L   Chloride 106 96 - 112 mEq/L   CO2 27 19 - 32 mEq/L   Albumin 3.2 (L) 3.5 - 5.2 g/dL   BUN 23 6 - 23 mg/dL   Creatinine, Ser 1.29 0.40 - 1.50 mg/dL   Glucose, Bld 98 70 - 99 mg/dL   Phosphorus 3.9 2.3 - 4.6 mg/dL   GFR 50.90 (L) >60.00 mL/min   Calcium 8.6 8.4 - 10.5 mg/dL  Vitamin B12  Result Value Ref Range   Vitamin B-12 >1504 (H) 211 - 911 pg/mL    Assessment & Plan:   Problem List Items Addressed This Visit     Seizure disorder [facility])    Encouraged consider change in seizure treatment discussed by neuro given concern over dilantin -related neuropathy.        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       Relevant Medications   phenytoin (DILANTIN) 100 MG ER capsule   desipramine (NORPRAMIN) 25 MG tablet   Peripheral neuropathy - Primary    Chronic, difficult to control, presumed related to long term dilantin use. He is interested in return to outpatient PT (last seen 07/2021 with benefit). Continue ALA, gabapentin, and recent addition of B-complex multivitamin.  Discussed benefit/risks of TCA - will trial least anticholinergic desipramine '25mg'$  nightly, update with effect.        Relevant Medications   phenytoin (DILANTIN) 100 MG ER capsule   desipramine (NORPRAMIN) 25 MG tablet   Other Relevant Orders   Phenytoin level, free and total   Serum protein electrophoresis with reflex   TSH (Completed)   Renal  function panel (Completed)   Vitamin B1   Vitamin B12 (Completed)   Ambulatory referral to Physical Therapy   Systolic murmur    Presumed aortic sclerosis without stenosis (by echo 2021)       Benign prostatic hyperplasia with urinary obstruction    Followed by urology on finasteride and  terazosin.          Meds ordered this encounter  Medications   Alpha-Lipoic Acid 600 MG TABS    Sig: Take 1 tablet by mouth daily.   phenytoin (DILANTIN) 100 MG ER capsule    Sig: Take 2 capsules (200 mg total) by mouth 2 (two) times daily.    Dispense:  360 capsule    Refill:  1   desipramine (NORPRAMIN) 25 MG tablet    Sig: Take 1 tablet (25 mg total) by mouth at bedtime.    Dispense:  30 tablet    Refill:  6   Orders Placed This Encounter  Procedures   Phenytoin level, free and total   Serum protein electrophoresis with reflex   TSH   Renal function panel   Vitamin B1   Vitamin B12   Ambulatory referral to Physical Therapy    Referral Priority:   Routine    Referral Type:   Physical Medicine    Referral Reason:   Specialty Services Required    Requested Specialty:   Physical Therapy    Number of Visits Requested:   1     Patient Instructions  Air cast brace for right ankle.  Labs today We will refer you back to physical therapy in [place].  Trial desipramine '25mg'$  nightly for peripheral neuropathy symptoms.  Let us know how you do with this.  Return in 6 months for physical/wellness visit   Follow up plan: Return in about 6 months (around 11/19/2022), or if symptoms worsen or fail to improve, for annual exam, prior fasting for blood work, medicare wellness visit.  Ria Bush, MD

## 2022-05-20 NOTE — Assessment & Plan Note (Signed)
Followed by urology on finasteride and terazosin.

## 2022-05-20 NOTE — Assessment & Plan Note (Signed)
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

## 2022-05-20 NOTE — Assessment & Plan Note (Addendum)
Chronic, difficult to control, presumed related to long term dilantin use. He is interested in return to outpatient PT (last seen 07/2021 with benefit). Continue ALA, gabapentin, and recent addition of B-complex multivitamin.  Discussed benefit/risks of TCA - will trial least anticholinergic desipramine '25mg'$  nightly, update with effect.  Will also update periph neuropathy labs.

## 2022-05-20 NOTE — Assessment & Plan Note (Signed)
Presumed aortic sclerosis without stenosis (by echo 2021)

## 2022-05-22 ENCOUNTER — Telehealth: Payer: Self-pay

## 2022-05-22 NOTE — Telephone Encounter (Signed)
Started prior auth for Desipramine HCl '25MG'$  tablets. Jarold Song Key: [id number] - [place] Case ID: [id number] - Rx #: [id number] Waiting for determination.

## 2022-05-23 LAB — PROTEIN ELECTROPHORESIS, SERUM, WITH REFLEX
Albumin ELP: 2.8 g/dL — ABNORMAL LOW (ref 3.8–4.8)
Alpha 1: 0.3 g/dL (ref 0.2–0.3)
Alpha 2: 0.9 g/dL (ref 0.5–0.9)
Beta 2: 0.3 g/dL (ref 0.2–0.5)
Beta Globulin: 0.2 g/dL — ABNORMAL LOW (ref 0.4–0.6)
Gamma Globulin: 0.7 g/dL — ABNORMAL LOW (ref 0.8–1.7)
Total Protein: 5.3 g/dL — ABNORMAL LOW (ref 6.1–8.1)

## 2022-05-23 LAB — PHENYTOIN LEVEL, FREE AND TOTAL: PHENYTOIN, FREE: 1.2 mg/L (ref 1.0–2.0)

## 2022-05-23 LAB — VITAMIN B1: Vitamin B1 (Thiamine): 52 nmol/L — ABNORMAL HIGH (ref 8–30)

## 2022-05-25 NOTE — Telephone Encounter (Signed)
Prior auth for Desipramine HCl '25MG'$  tablets has been approved. Approved on June 2 Request Reference Number: [id number]. DESIPRAMINE TAB '25MG'$  is approved through 12/20/2022. Your patient may now fill this prescription and it will be covered.

## 2022-05-26 ENCOUNTER — Encounter: Payer: Self-pay | Admitting: Family Medicine

## 2022-05-26 DIAGNOSIS — E778 Other disorders of glycoprotein metabolism: Secondary | ICD-10-CM

## 2022-06-01 DIAGNOSIS — E778 Other disorders of glycoprotein metabolism: Secondary | ICD-10-CM | POA: Insufficient documentation

## 2022-06-03 ENCOUNTER — Other Ambulatory Visit (INDEPENDENT_AMBULATORY_CARE_PROVIDER_SITE_OTHER): Payer: Medicare Other

## 2022-06-03 DIAGNOSIS — E778 Other disorders of glycoprotein metabolism: Secondary | ICD-10-CM

## 2022-06-03 LAB — URINALYSIS, ROUTINE W REFLEX MICROSCOPIC
Bilirubin Urine: NEGATIVE
Leukocytes,Ua: NEGATIVE
Nitrite: NEGATIVE
Specific Gravity, Urine: 1.02 (ref 1.000–1.030)
Total Protein, Urine: >=300 — AB
Urine Glucose: NEGATIVE
Urobilinogen, UA: 0.2 (ref 0.0–1.0)
pH: 5.5 (ref 5.0–8.0)

## 2022-06-04 ENCOUNTER — Other Ambulatory Visit: Payer: Self-pay | Admitting: Family Medicine

## 2022-06-04 DIAGNOSIS — R809 Proteinuria, unspecified: Secondary | ICD-10-CM | POA: Insufficient documentation

## 2022-06-10 ENCOUNTER — Ambulatory Visit: Payer: Medicare Other | Attending: Family Medicine | Admitting: Physical Therapy

## 2022-06-10 ENCOUNTER — Encounter: Payer: Self-pay | Admitting: Physical Therapy

## 2022-06-10 DIAGNOSIS — R2681 Unsteadiness on feet: Secondary | ICD-10-CM | POA: Insufficient documentation

## 2022-06-10 DIAGNOSIS — Z9181 History of falling: Secondary | ICD-10-CM | POA: Diagnosis present

## 2022-06-10 DIAGNOSIS — G8929 Other chronic pain: Secondary | ICD-10-CM | POA: Diagnosis present

## 2022-06-10 DIAGNOSIS — R208 Other disturbances of skin sensation: Secondary | ICD-10-CM | POA: Insufficient documentation

## 2022-06-10 DIAGNOSIS — M545 Low back pain, unspecified: Secondary | ICD-10-CM | POA: Diagnosis present

## 2022-06-10 DIAGNOSIS — R262 Difficulty in walking, not elsewhere classified: Secondary | ICD-10-CM | POA: Insufficient documentation

## 2022-06-10 DIAGNOSIS — G6289 Other specified polyneuropathies: Secondary | ICD-10-CM | POA: Diagnosis not present

## 2022-06-10 NOTE — Therapy (Signed)
OUTPATIENT PHYSICAL THERAPY EVALUATION   Patient Name: Mark Holmes MRN: [id number] DOB:04-28-1935, 87 y.o., male Today's Date: 06/10/2022   PT End of Session - 06/10/22 2034     Visit Number 1    Number of Visits 24    Date for PT Re-Evaluation 09/02/22    Authorization Type [employer] MEDICARE reporting period from 06/10/2022    Progress Note Due on Visit 10    PT Start Time 1120    PT Stop Time 1200    PT Time Calculation (min) 40 min    Activity Tolerance Patient tolerated treatment well             Past Medical History:  Diagnosis Date   Benign prostatic hypertrophy with elevated PSA    per Dr. Jacqlyn Larsen   History of CT scan of brain 1992   normal   Hyperglycemia 01/2006   102   Hyperlipemia    Hypertension    Seizure disorder [facility])    Past Surgical History:  Procedure Laterality Date   ANTERIOR CERVICAL DECOMP/DISCECTOMY FUSION  05/2018   for myelopathy -C3/4 ([place] [facility])   CARPAL TUNNEL RELEASE Right 06/2019   ([place])   CATARACT EXTRACTION W/ INTRAOCULAR LENS IMPLANT Right 2012   [facility]   CATARACT EXTRACTION W/PHACO Left 01/14/2022   Procedure: CATARACT EXTRACTION PHACO AND INTRAOCULAR LENS PLACEMENT ([place]) LEFT;  Surgeon: Leandrew Koyanagi, MD;  Location: [place];  Service: Ophthalmology;  Laterality: Left;  7.55 01:03.6   History of EEG  1985   normal   hospitalization  1981   observation for seizuare   TONSILLECTOMY     TRANSURETHRAL RESECTION OF PROSTATE  10/2005   [facility]   TRANSURETHRAL RESECTION OF PROSTATE  08/2017   (rpt) [facility]   Patient Active Problem List   Diagnosis Date Noted   Proteinuria 06/04/2022   Hypoproteinemia ([place]) 06/01/2022   Hearing loss due to cerumen impaction 03/01/2021   Atypical chest pain 07/10/2020   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   Unsteadiness on feet 11/08/2019   History of spinal surgery 01/17/2019   Neck pain 01/17/2019   Weakness of hand 01/17/2019   Benign prostatic hyperplasia  with urinary obstruction 75/79/7282   Systolic murmur 05/21/5614   Right carpal tunnel syndrome 02/02/2018   Ulnar neuropathy at elbow, left 02/02/2018   Peripheral neuropathy 01/26/2018   Bilateral buttock pain 06/29/2016   Failed vision screen 06/29/2016   Advanced care planning/counseling discussion 06/21/2015   Medicare annual wellness visit, subsequent 06/18/2014   Health maintenance examination 06/18/2014   Chronic ankle pain 06/18/2014   Incomplete emptying of bladder 06/07/2013   Vitamin D deficiency 05/20/2013   THYROID NODULE 05/17/2007   HLD (hyperlipidemia) 05/17/2007   Essential tremor 05/17/2007   Essential hypertension 05/17/2007   Cervical spondylosis with myelopathy and radiculopathy 05/17/2007   Seizure disorder ([place]) 05/17/2007    PCP: Ria Bush, MD  REFERRING PROVIDER: Ria Bush, MD  REFERRING DIAG: other polyneuropathy  Rationale for Evaluation and Treatment: Rehabilitation  THERAPY DIAG:  Difficulty in walking, not elsewhere classified  Unsteadiness on feet  Other disturbances of skin sensation  ONSET DATE: chronic  SUBJECTIVE:  SUBJECTIVE STATEMENT: Patient is known to this clinic and PT and completed a prior episode of care for similar complaint last year resulting in improved balance and discharge to long term HEP due to reaching max improvement at that time. Patient states he is returning to PT due to continued discomfort from his knees down. He states he feels tightness, and hardness in his calves and lower legs after prolonged sitting (1.5 or more). Despite what his doctors have told him, he is concerned about his low back being the cause of his leg symptoms. He read an article about cauda equina syndrome he found on the Internet a couple of days ago  and he says it describes what he has been experiencing. He printed it and brought it in today along with some other information. He denies having trouble with incontinence or saddle paresthesia. He did get a injection in his back, which did not have any effect on his back or legs. He has back pain when he gets up in the morning but once he gets going he doesn't feel it. Patient has been doing his long term HEP for balance and ankle mobility/strength since he discharged from PT last year. He has noticed improvements in his right ankle mobility and control. He was on his hands and knees weeding and he think he hurt his left ankle slightly but it is getting better. Per chart review Dr. Manuella Ghazi (Neurology) has discussed switching seizure medications because neuropathy is a side effect of long term dilantin. Patient's PCP has also suggested changing his seizure medication for the same reason. Patient states his wife did not want him to switch seizure meds due to fear he may have another seizure.    PERTINENT HISTORY:  Patient is a 87 y.o. male who presents to outpatient physical therapy with a referral for medical diagnosis other polyneuropathy. This patient's chief complaints consist of altered sensation, feeling of tightness in bilateral LE below the knees, R ankle plain, low back pain leading to the following functional deficits: difficulty with balance, household and community mobility, ADLs, transfers, and usual mobility without falling, sleeping. 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    PAIN:  Are you having pain? Yes: NPRS scale: not provided Pain location: right ankle, bilateral lower extremity below knees, low back.  Pain description: low back in morning, R ankle when standing and walking on it,  B lower legs feel like something is squeezing them, or like they are tight like leather.  Aggravating factors: lower legs: prolonged sitting; R ankle: prolonged weight bearing and use; low back: mornings.  Relieving factors: legs and back: getting up and moving around, ankle: resting or not overusing it.   FUNCTIONAL LIMITATIONS: difficulty with balance, household and community mobility, ADLs, transfers, and usual mobility without falling, sleeping.   PRECAUTIONS: Fall  WEIGHT BEARING RESTRICTIONS No  FALLS:  Has patient fallen in last 6 months? No  LEISURE: gardening  PLOF: Independent  PATIENT GOALS: patient would like his legs to stop hurting, improve his balance, and to  run with the PT.    OBJECTIVE  DIAGNOSTIC FINDINGS:  NCS/EMG lower extremity 04/08/2021: "This is an abnormal electrodiagnostic study consistent with a generalized sensory predeominant polyneuropathy" per neurology notes.   OBSERVATION/INSPECTION Posture Posture (standing): bilateral genuvarum, L > R, weight shifted towards right LE, wide stance.  Anthropometrics Tremor: none Body composition: WFL Edema: pitting edema bilateral ankles and lower legs, lateral > medial.  Functional Mobility Bed mobility: supine <> sit and rolling mod I due to increased time and effort. Groaning.  Transfers: sit <> stand I with B UE support.  Gait: ambulates with SPC in R UE, wide stance, unsteady at times.   SPINE MOTION LUMBAR SPINE AROM *Indicates pain Flexion: fingers on the floor, no pain  Extension: 50%, feels good Side Flexion:   R WNL  L 75% pain in left  back Rotation:  R WNL L WNL  NEUROLOGICAL Sensation Diminished to light touch at left lower leg, absent to light touch on R lower leg except at achilles. Intact to light touch B anterior thighs, diminished to light touch B proximal lower legs.    PERIPHERAL JOINT MOTION (in degrees) PASSIVE RANGE OF MOTION (PROM) Comments: B LE appears WFL except right ankle restriction in all directions compared to left, and noted for stiffness in B hip extension (~10 degrees available).   MUSCLE PERFORMANCE (MMT):  *Indicates pain 06/10/22 Date Date  Joint/Motion R/L R/L R/L  Hip     Flexion (L1, L2) 4+/5 / /  Extension (knee ext) 4+4+/ / /  Extension (knee flex) / / /  Abduction 4+/4+ / /  Knee     Extension (L3) 4+/4+ / /  Flexion (S2) 4/4 / /  Ankle/Foot     Dorsiflexion (L4) 5/4 / /  Great toe extension (L5) 4/4 / /  Eversion (S1) 5/5 / /  Comments:  06/10/2022: able to toe walk on the left and heel walk on both LE with UE support.   SPECIAL TESTS:  LOWER LIMB NEURODYNAMIC TESTS Straight Leg Raise (Sciatic nerve)  R  = positive for neural tension at back of thigh, not concordant.   L  = positive for neural tension at back of thigh, not concordant.    FUNCTIONAL/BALANCE TESTS:  Romberg test: -Narrow stance, eyes open: >30 seconds -Narrow stance, eyes closed: 8 seconds  -Tandem stance, eyes open: R front = 5 seconds, L front = <1 second.   TODAY'S TREATMENT  education   PATIENT EDUCATION:  Education details: education about cauda equina syndrome, radiculopathy, low back pain, neuropathy, nerve conduction results in chart, Education on diagnosis, prognosis, POC, anatomy and physiology of current condition.  Person educated: Patient Education method: Customer service manager Education comprehension: verbalized understanding and needs further education   HOME EXERCISE PROGRAM: TBD  ASSESSMENT:  CLINICAL IMPRESSION: Patient is a 87 y.o. male referred to outpatient  physical therapy with a medical diagnosis of other polyneuropathy who presents with signs and symptoms consistent with imbalance, difficulty with motor control and ambulation consistent with peripheral neuropathy and possible contribution from history of cervical myelopathy. Patient is negative for concordant symptoms with lumbar AROM and lower extremity neurodynamic testing, significantly decreasing likelihood that primary symptoms are from lumbar radiculopathy or sciatica. Patient presents with significant balance, sensory, motor control, postural control, pain, ROM, joint stiffness, muscle performance (strength/power/endurance), knowledge, and activity tolerance impairments that are limiting ability to complete his usual activities such as balance, household and community mobility, ADLs, transfers, and usual mobility without falling, sleeping without  difficulty. Patient will benefit from skilled physical therapy intervention to address current body structure impairments and activity limitations to improve function and work towards goals set in current POC in order to return to prior level of function or maximal functional improvement.    OBJECTIVE IMPAIRMENTS Abnormal gait, decreased activity tolerance, decreased balance, decreased coordination, decreased endurance, decreased knowledge of condition, decreased mobility, difficulty walking, decreased ROM, decreased strength, increased edema, impaired sensation, and pain.   ACTIVITY LIMITATIONS carrying, lifting, bending, sitting, standing, squatting, sleeping, stairs, transfers, bed mobility, dressing, and locomotion level  PARTICIPATION LIMITATIONS: shopping, community activity, yard work, and   Insurance underwriter, household and community mobility, ADLs, transfers, and usual mobility without falling, sleeping.  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   REHAB POTENTIAL: Fair patient previously exhausted improvements in PT and has continued doing HEP regularly since then. Underlying cause of issue (neuropathy) unlikely to be able to be reversed by PT  CLINICAL DECISION MAKING: Stable/uncomplicated  EVALUATION COMPLEXITY: Low   GOALS: Goals reviewed with patient? No  SHORT TERM GOALS: Target date: 06/24/2022  Patient will be independent with initial home exercise program for self-management of symptoms. Baseline: Initial HEP to be provided at visit 2 as appropriate (06/10/22); Goal status: INITIAL   LONG TERM GOALS: Target date: 09/02/2022  Patient will be independent with a long-term home exercise program for self-management of symptoms.  Baseline: Initial HEP to be provided at visit 2 as appropriate (06/10/22); Goal status: INITIAL  2.  Patient will demonstrate improved FOTO by equal or greater than 10 points to demonstrate improvement in overall condition and self-reported functional ability.  Baseline: to be measured at visit 2 as appropriate (06/10/22); Goal status: INITIAL  3.  Patient will score equal or greater than 25/30 on Functional Gait Assessment to demonstrate low fall risk.  Baseline: to be tested at visit 2 as appropriate (06/10/22); Goal status: INITIAL  4.  Patient will balance in tandem stance equal or greater than 15 seconds with each foot front to  show improved static balance and decreased fall risk.  Baseline: R front = 5 seconds, L front = <1 second.  (06/10/22); Goal status: INITIAL  5.  Patient will complete community, work and/or recreational activities without limitation due to current condition.  Baseline: balance, household and community mobility, ADLs, transfers, and usual mobility without falling, sleeping (06/10/22); Goal status: INITIAL    PLAN: PT FREQUENCY: 1-2x/week  PT DURATION: 12 weeks  PLANNED INTERVENTIONS: Therapeutic exercises, Therapeutic activity, Neuromuscular re-education, Balance training, Gait training, Patient/Family education, Joint mobilization, Stair training, DME instructions, Dry Needling, Electrical stimulation, Spinal mobilization, Cryotherapy, Moist heat, Manual therapy, and Re-evaluation.  PLAN FOR NEXT SESSION: FGA, FOTO, balance exercises, update HEP as appropriate.    Everlean Alstrom. Graylon Good, PT, DPT 06/10/22, 8:40 PM  [facility] [address] [place], [place] [address] P: [phone] I F: [phone]

## 2022-06-13 ENCOUNTER — Encounter: Payer: Self-pay | Admitting: Family Medicine

## 2022-06-15 ENCOUNTER — Other Ambulatory Visit: Payer: Medicare Other

## 2022-06-16 ENCOUNTER — Ambulatory Visit: Payer: Medicare Other

## 2022-06-16 ENCOUNTER — Encounter: Payer: Self-pay | Admitting: Physical Therapy

## 2022-06-16 DIAGNOSIS — Z9181 History of falling: Secondary | ICD-10-CM

## 2022-06-16 DIAGNOSIS — R262 Difficulty in walking, not elsewhere classified: Secondary | ICD-10-CM

## 2022-06-16 DIAGNOSIS — R2681 Unsteadiness on feet: Secondary | ICD-10-CM

## 2022-06-16 DIAGNOSIS — R208 Other disturbances of skin sensation: Secondary | ICD-10-CM

## 2022-06-16 DIAGNOSIS — M545 Low back pain, unspecified: Secondary | ICD-10-CM

## 2022-06-17 ENCOUNTER — Encounter: Payer: Self-pay | Admitting: Urology

## 2022-06-17 ENCOUNTER — Other Ambulatory Visit: Payer: Self-pay

## 2022-06-17 DIAGNOSIS — R809 Proteinuria, unspecified: Secondary | ICD-10-CM

## 2022-06-18 LAB — EXTRA URINE SPECIMEN

## 2022-06-18 LAB — PROTEIN, URINE, 24 HOUR: Protein, 24H Urine: 6510 mg/24 h — ABNORMAL HIGH (ref 0–149)

## 2022-06-19 ENCOUNTER — Encounter: Payer: Self-pay | Admitting: Family Medicine

## 2022-06-20 ENCOUNTER — Observation Stay
Admission: EM | Admit: 2022-06-20 | Discharge: 2022-06-22 | Disposition: A | Discharge status: Home / Self Care | Payer: Medicare Other | Attending: Internal Medicine | Admitting: Internal Medicine

## 2022-06-20 ENCOUNTER — Emergency Department: Payer: Medicare Other

## 2022-06-20 ENCOUNTER — Encounter: Payer: Self-pay | Admitting: Emergency Medicine

## 2022-06-20 ENCOUNTER — Other Ambulatory Visit: Payer: Self-pay

## 2022-06-20 DIAGNOSIS — R809 Proteinuria, unspecified: Secondary | ICD-10-CM | POA: Diagnosis not present

## 2022-06-20 DIAGNOSIS — R2689 Other abnormalities of gait and mobility: Secondary | ICD-10-CM | POA: Insufficient documentation

## 2022-06-20 DIAGNOSIS — I10 Essential (primary) hypertension: Secondary | ICD-10-CM | POA: Diagnosis not present

## 2022-06-20 DIAGNOSIS — N400 Enlarged prostate without lower urinary tract symptoms: Secondary | ICD-10-CM | POA: Diagnosis present

## 2022-06-20 DIAGNOSIS — G620 Drug-induced polyneuropathy: Secondary | ICD-10-CM

## 2022-06-20 DIAGNOSIS — H532 Diplopia: Secondary | ICD-10-CM | POA: Diagnosis present

## 2022-06-20 DIAGNOSIS — Z79899 Other long term (current) drug therapy: Secondary | ICD-10-CM | POA: Insufficient documentation

## 2022-06-20 DIAGNOSIS — Z7982 Long term (current) use of aspirin: Secondary | ICD-10-CM | POA: Insufficient documentation

## 2022-06-20 DIAGNOSIS — I639 Cerebral infarction, unspecified: Principal | ICD-10-CM | POA: Insufficient documentation

## 2022-06-20 DIAGNOSIS — N049 Nephrotic syndrome with unspecified morphologic changes: Secondary | ICD-10-CM | POA: Diagnosis not present

## 2022-06-20 DIAGNOSIS — E782 Mixed hyperlipidemia: Secondary | ICD-10-CM | POA: Diagnosis present

## 2022-06-20 DIAGNOSIS — I671 Cerebral aneurysm, nonruptured: Secondary | ICD-10-CM | POA: Insufficient documentation

## 2022-06-20 DIAGNOSIS — G40909 Epilepsy, unspecified, not intractable, without status epilepticus: Secondary | ICD-10-CM

## 2022-06-20 DIAGNOSIS — G629 Polyneuropathy, unspecified: Secondary | ICD-10-CM

## 2022-06-20 DIAGNOSIS — Z8673 Personal history of transient ischemic attack (TIA), and cerebral infarction without residual deficits: Secondary | ICD-10-CM | POA: Diagnosis present

## 2022-06-20 LAB — DIFFERENTIAL
Abs Immature Granulocytes: 0.01 10*3/uL (ref 0.00–0.07)
Basophils Absolute: 0.1 10*3/uL (ref 0.0–0.1)
Basophils Relative: 1 %
Eosinophils Absolute: 0.2 10*3/uL (ref 0.0–0.5)
Eosinophils Relative: 3 %
Immature Granulocytes: 0 %
Lymphocytes Relative: 20 %
Lymphs Abs: 1.4 10*3/uL (ref 0.7–4.0)
Monocytes Absolute: 0.5 10*3/uL (ref 0.1–1.0)
Monocytes Relative: 8 %
Neutro Abs: 4.5 10*3/uL (ref 1.7–7.7)
Neutrophils Relative %: 68 %

## 2022-06-20 LAB — CBC
HCT: 32.7 % — ABNORMAL LOW (ref 39.0–52.0)
Hemoglobin: 10.8 g/dL — ABNORMAL LOW (ref 13.0–17.0)
MCH: 29.1 pg (ref 26.0–34.0)
MCHC: 33 g/dL (ref 30.0–36.0)
MCV: 88.1 fL (ref 80.0–100.0)
Platelets: 257 10*3/uL (ref 150–400)
RBC: 3.71 MIL/uL — ABNORMAL LOW (ref 4.22–5.81)
RDW: 13.4 % (ref 11.5–15.5)
WBC: 6.6 10*3/uL (ref 4.0–10.5)
nRBC: 0 % (ref 0.0–0.2)

## 2022-06-20 LAB — COMPREHENSIVE METABOLIC PANEL
ALT: 17 U/L (ref 0–44)
AST: 21 U/L (ref 15–41)
Albumin: 2.5 g/dL — ABNORMAL LOW (ref 3.5–5.0)
Alkaline Phosphatase: 122 U/L (ref 38–126)
Anion gap: 2 — ABNORMAL LOW (ref 5–15)
BUN: 25 mg/dL — ABNORMAL HIGH (ref 8–23)
CO2: 26 mmol/L (ref 22–32)
Calcium: 7.9 mg/dL — ABNORMAL LOW (ref 8.9–10.3)
Chloride: 109 mmol/L (ref 98–111)
Creatinine, Ser: 1.09 mg/dL (ref 0.61–1.24)
GFR, Estimated: >60 mL/min (ref >60)
Glucose, Bld: 143 mg/dL — ABNORMAL HIGH (ref 70–99)
Potassium: 3.9 mmol/L (ref 3.5–5.1)
Sodium: 137 mmol/L (ref 135–145)
Total Bilirubin: 0.5 mg/dL (ref 0.3–1.2)
Total Protein: 5.2 g/dL — ABNORMAL LOW (ref 6.5–8.1)

## 2022-06-20 LAB — PROTIME-INR
INR: 1.1 (ref 0.8–1.2)
Prothrombin Time: 13.8 seconds (ref 11.4–15.2)

## 2022-06-20 LAB — ETHANOL: Alcohol, Ethyl (B): <10 mg/dL (ref <10)

## 2022-06-20 LAB — APTT: aPTT: 29 seconds (ref 24–36)

## 2022-06-20 LAB — PHENYTOIN LEVEL, TOTAL: Phenytoin Lvl: 8.2 ug/mL — ABNORMAL LOW (ref 10.0–20.0)

## 2022-06-20 MED ORDER — TERAZOSIN HCL 5 MG PO CAPS
10.0000 mg | ORAL_CAPSULE | Freq: Every day | ORAL | Status: DC
  Administered 2022-06-21: 10 mg via ORAL
  Filled 2022-06-20 (×2): qty 2

## 2022-06-20 MED ORDER — DESIPRAMINE HCL 25 MG PO TABS: 25.0000 mg | ORAL_TABLET | Freq: Every day | ORAL | Status: DC

## 2022-06-20 MED ORDER — ATORVASTATIN CALCIUM 20 MG PO TABS
20.0000 mg | ORAL_TABLET | Freq: Every day | ORAL | Status: DC
  Administered 2022-06-21: 20 mg via ORAL
  Filled 2022-06-20: qty 1

## 2022-06-20 MED ORDER — ASPIRIN 81 MG PO TBEC
81.0000 mg | DELAYED_RELEASE_TABLET | Freq: Every day | ORAL | Status: DC
  Administered 2022-06-21 – 2022-06-22 (×2): 81 mg via ORAL
  Filled 2022-06-20 (×2): qty 1

## 2022-06-20 MED ORDER — ONDANSETRON HCL 4 MG/2ML IJ SOLN: 4.0000 mg | Freq: Four times a day (QID) | INTRAMUSCULAR | Status: DC | PRN

## 2022-06-20 MED ORDER — PHENYTOIN SODIUM EXTENDED 100 MG PO CAPS
200.0000 mg | ORAL_CAPSULE | Freq: Two times a day (BID) | ORAL | Status: DC
  Administered 2022-06-21 – 2022-06-22 (×3): 200 mg via ORAL
  Filled 2022-06-20 (×3): qty 2

## 2022-06-20 MED ORDER — ENOXAPARIN SODIUM 40 MG/0.4ML IJ SOSY
40.0000 mg | PREFILLED_SYRINGE | INTRAMUSCULAR | Status: DC
  Administered 2022-06-21 – 2022-06-22 (×2): 40 mg via SUBCUTANEOUS
  Filled 2022-06-20 (×2): qty 0.4

## 2022-06-20 MED ORDER — ACETAMINOPHEN 325 MG PO TABS
650.0000 mg | ORAL_TABLET | Freq: Four times a day (QID) | ORAL | Status: DC | PRN
  Administered 2022-06-21 (×4): 650 mg via ORAL
  Filled 2022-06-20 (×4): qty 2

## 2022-06-20 MED ORDER — POLYETHYLENE GLYCOL 3350 17 G PO PACK: 17.0000 g | PACK | Freq: Every day | ORAL | Status: DC | PRN

## 2022-06-20 MED ORDER — CLOPIDOGREL BISULFATE 75 MG PO TABS
75.0000 mg | ORAL_TABLET | Freq: Every day | ORAL | Status: DC
  Administered 2022-06-21 – 2022-06-22 (×2): 75 mg via ORAL
  Filled 2022-06-20 (×2): qty 1

## 2022-06-20 MED ORDER — ACETAMINOPHEN 650 MG RE SUPP: 650.0000 mg | Freq: Four times a day (QID) | RECTAL | Status: DC | PRN

## 2022-06-20 MED ORDER — STROKE: EARLY STAGES OF RECOVERY BOOK: Freq: Once | Status: DC

## 2022-06-20 MED ORDER — GABAPENTIN 300 MG PO CAPS
300.0000 mg | ORAL_CAPSULE | Freq: Two times a day (BID) | ORAL | Status: DC
  Administered 2022-06-21 – 2022-06-22 (×3): 300 mg via ORAL
  Filled 2022-06-20 (×3): qty 1

## 2022-06-20 MED ORDER — ASPIRIN 81 MG PO CHEW
324.0000 mg | CHEWABLE_TABLET | Freq: Once | ORAL | Status: AC
  Administered 2022-06-20: 324 mg via ORAL
  Filled 2022-06-20: qty 4

## 2022-06-20 MED ORDER — ONDANSETRON HCL 4 MG PO TABS: 4.0000 mg | ORAL_TABLET | Freq: Four times a day (QID) | ORAL | Status: DC | PRN

## 2022-06-20 MED ORDER — HYDRALAZINE HCL 20 MG/ML IJ SOLN: 10.0000 mg | Freq: Four times a day (QID) | INTRAMUSCULAR | Status: DC | PRN

## 2022-06-20 NOTE — ED Notes (Signed)
Pt with MRI

## 2022-06-20 NOTE — Assessment & Plan Note (Addendum)
   Patient outside of tPA window  Performing serial neurologic checks  Monitoring patient on telemetry  Initiating antiplatelet therapy including aspirin and Plavix  Daily statin therapy will be initiated if LDL is greater than 70  Further imaging to include: CT angiogram of the head and neck  Obtaining hemoglobin A1c and lipid panel in the morning  Echocardiogram in the morning with bubble study  PT, OT, SLP evaluation  Permissive hypertension with as needed antihypertensives only to be given if blood pressure greater than 220/115  Inbox message sent to neurology requesting consultation in the morning, their assistance is appreciated.

## 2022-06-20 NOTE — Assessment & Plan Note (Addendum)
   Continue home regimen of Dilantin for now  Considering phenytoin is 90% protein bound I am curious as to how efficacious it has been for his seizure disorder with his nephrotic range proteinuria.   Obtaining phenytoin level

## 2022-06-20 NOTE — Assessment & Plan Note (Signed)
   Continue home regimen of terazosin

## 2022-06-20 NOTE — ED Triage Notes (Signed)
Pt reports woke up this am and his eyes were not working right. Pt reports with both eyes he sees double but is he closes one eye, it is fine. Pt also reports has really elevated protein in his urine. Pt denies pain, headache, cough, sob or other concerns at this time.

## 2022-06-20 NOTE — ED Provider Notes (Signed)
[facility] Provider Note    Event Date/Time   First MD Initiated Contact with Patient 06/20/22 1516     (approximate)   History   Double vision   HPI  Mark Holmes is a 87 y.o. male  who per primary care office note dated 05/19/22 has history of seizure disorder, who presents to the emergency department today because of concern for double vision and dizziness.  The patient states that the symptoms are first noticed when he woke up this morning.  Was in his normal state of health yesterday.  Usual activity yesterday.  Noticed this morning when he got up to go the bathroom that he double vision. Feels like his vision is normal when he closes either eye.  Also felt dizzy.  This has persisted throughout the day.  Patient denies similar symptoms in the past.  Denies any associated headache.    Physical Exam   Triage Vital Signs: ED Triage Vitals  Enc Vitals Group     BP 06/20/22 1509 (!) 176/99     Pulse Rate 06/20/22 1509 72     Resp 06/20/22 1509 13     Temp 06/20/22 1509 97.9 F (36.6 C)     Temp Source 06/20/22 1509 Oral     SpO2 06/20/22 1509 99 %     Weight 06/20/22 1359 177 lb (80.3 kg)     Height 06/20/22 1359 '5\' 7"'$  (1.702 m)     Head Circumference --      Peak Flow --      Pain Score 06/20/22 1357 0     Pain Loc --      Pain Edu? --      Excl. in [place]? --     Most recent vital signs: Vitals:   06/20/22 1509  BP: (!) 176/99  Pulse: 72  Resp: 13  Temp: 97.9 F (36.6 C)  SpO2: 99%   General: Awake, alert, oriented. CV:  Good peripheral perfusion. Regular rate and rhythm. Resp:  Normal effort. Lungs clear. Abd:  No distention.  Other:  Unable to adduct left eye. PERRL. Strength 5/5 in upper and lower extremities. Sensation grossly intact.    ED Results / Procedures / Treatments   Labs (all labs ordered are listed, but only abnormal results are displayed) Labs Reviewed  CBC - Abnormal; Notable for the following components:       Result Value   RBC 3.71 (*)    Hemoglobin 10.8 (*)    HCT 32.7 (*)    All other components within normal limits  COMPREHENSIVE METABOLIC PANEL - Abnormal; Notable for the following components:   Glucose, Bld 143 (*)    BUN 25 (*)    Calcium 7.9 (*)    Total Protein 5.2 (*)    Albumin 2.5 (*)    Anion gap 2 (*)    All other components within normal limits  PROTIME-INR  APTT  DIFFERENTIAL  ETHANOL  CBG MONITORING, ED     EKG  I, Nance Pear, attending physician, personally viewed and interpreted this EKG  EKG Time: 1356 Rate: 81 Rhythm: normal sinus rhythm Axis: normal Intervals: qtc 441 QRS: narrow, q waves v1 ST changes: no st elevation Impression: abnormal ekg  RADIOLOGY I independently interpreted and visualized the CT head. My interpretation: No acute bleed. Radiology interpretation:  IMPRESSION:  1. No acute intracranial abnormality.  2. Chronic microvascular ischemic change and cerebral volume loss.  3. Paranasal sinus disease.  MR brain IMPRESSION:  1. Punctate acute/subacute nonhemorrhagic infarct in the posterior  left lower midbrain near the fourth cranial nerve nucleus.  2. Remote lacunar infarcts of the right thalamus and right  cerebellum.  3. Mild atrophy and white matter disease likely reflects the sequela  of chronic microvascular ischemia.    PROCEDURES:  Critical Care performed: No  Procedures   MEDICATIONS ORDERED IN ED: Medications - No data to display   IMPRESSION / MDM / [place] / ED COURSE  I reviewed the triage vital signs and the nursing notes.                              Differential diagnosis includes, but is not limited to, peripheral nerve palsy, CVA.  Patient's presentation is most consistent with acute presentation with potential threat to life or bodily function.  Patient presented to the emergency department today because of concerns for double vision that was first noticed when he woke  up this morning.  MRI is consistent with an acute stroke.  I discussed this with the patient.  Discussed with Dr. Sidney Ace with the hospitalist service who will plan on admission.   FINAL CLINICAL IMPRESSION(S) / ED DIAGNOSES   Final diagnoses:  Cerebrovascular accident (CVA), unspecified mechanism ([place])     Note:  This document was prepared using Dragon voice recognition software and may include unintentional dictation errors.    Nance Pear, MD 06/20/22 [phone]

## 2022-06-20 NOTE — ED Notes (Addendum)
Pt presents to ED with c/o dizziness. Pt states he woke up this morning with "double vision" and loss his balance. Pt still c/o dizziness and double vision at this time. Pt is A&Ox4 and speech is clear.

## 2022-06-20 NOTE — Assessment & Plan Note (Addendum)
.   Permissive hypertension in the setting of acute stroke . Holding home regimen of oral antihypertensives . PRN intravenous antihypertensives for excessively elevated blood pressure in excess of 220/115

## 2022-06-20 NOTE — Assessment & Plan Note (Addendum)
   Thought to be due to longstanding phenytoin use per review of outpatient notes.   Continue home regimen of gabapentin

## 2022-06-20 NOTE — Assessment & Plan Note (Signed)
.   Continuing home regimen of lipid lowering therapy. . We will continue to titrate statin therapy further if LDL found to be greater than 70

## 2022-06-20 NOTE — H&P (Incomplete)
History and Physical    Patient: Mark Holmes MRN: [id number] DOA: 06/20/2022  Date of Service: the patient was seen and examined on 06/21/2022  Patient coming from: Home  Chief Complaint:  Chief Complaint  Patient presents with   Dizziness    HPI:   87 year old male with past medical history of hyperlipidemia, benign prostatic hyperplasia, seizure disorder, hypertension, cervical radiculopathy (S/P ACDF C3-C4), essential tremor and peripheral neuropathy who presents to [facility] emergency department with complaints of sudden onset double vision.  Patient explains that he woke up this morning suddenly noticing that he had visual changes.  Patient states that upon ambulating to the bathroom he noticed that he was unsteady on his feet.  Upon making it to the bathroom and looking in the mirror he noticed that he was seeing double vision which intermittently resolves when he closes 1 eye.  Patient denies any associated focal weakness, facial droop or slurred speech.  Patient denies any associated headache, fever or sick contacts.  Last known normal was yesterday evening upon going to sleep.  Patient's symptoms continue to persist throughout the morning into the patient presented to [facility] emergency department for evaluation.  Upon evaluation in the emergency department ER provider was concerned for a possible stroke however patient was felt to be outside of the tPA window since patient woke up with the symptoms.  An MRI brain was performed which revealed a punctate acute/subacute nonhemorrhagic infarct in the posterior left lower midbrain near the 4th cranial nerve nucleus with remote lacunar infarcts of the right thalamus and right cerebellum.  Patient was administered aspirin.  Neurology was not contacted by the ER provider considering the time of diagnosis.  The hospitalist group was then called to assess the patient for admission to the hospital.  Review of Systems: Review of Systems  Eyes:   Positive for double vision.  Neurological:  Positive for dizziness.  All other systems reviewed and are negative.    Past Medical History:  Diagnosis Date   Benign prostatic hypertrophy with elevated PSA    per Dr. Jacqlyn Larsen   History of CT scan of brain 1992   normal   Hyperglycemia 01/2006   102   Hyperlipemia    Hypertension    Seizure disorder [facility])     Past Surgical History:  Procedure Laterality Date   ANTERIOR CERVICAL DECOMP/DISCECTOMY FUSION  05/2018   for myelopathy -C3/4 ([place] [facility])   CARPAL TUNNEL RELEASE Right 06/2019   ([place])   CATARACT EXTRACTION W/ INTRAOCULAR LENS IMPLANT Right 2012   [place] [facility]   CATARACT EXTRACTION W/PHACO Left 01/14/2022   Procedure: CATARACT EXTRACTION PHACO AND INTRAOCULAR LENS PLACEMENT ([place]) LEFT;  Surgeon: Leandrew Koyanagi, MD;  Location: [place];  Service: Ophthalmology;  Laterality: Left;  7.55 01:03.6   History of EEG  1985   normal   hospitalization  1981   observation for seizuare   TONSILLECTOMY     TRANSURETHRAL RESECTION OF PROSTATE  10/2005   [name]   TRANSURETHRAL RESECTION OF PROSTATE  08/2017   (rpt) [facility]    Social History:  reports that he has never smoked. He has never used smokeless tobacco. He reports that he does not drink alcohol and does not use drugs.  Allergies  Allergen Reactions   Dilaudid [Hydromorphone Hcl] Other (See Comments)    hallucinations    Family History  Problem Relation Age of Onset   Febrile seizures Sister    Cancer Sister  lung/smoker   CAD Neg Hx     Prior to Admission medications   Medication Sig Start Date End Date Taking? Authorizing Provider  Alpha-Lipoic Acid 600 MG TABS Take 1 tablet by mouth daily. 05/19/22  Yes Ria Bush, MD  aspirin-acetaminophen-caffeine [facility] MIGRAINE) [phone] MG per tablet Take 1 tablet by mouth 2 (two) times daily as needed.    Yes [provider]  atorvastatin (LIPITOR) 20 MG tablet Take 1  tablet (20 mg total) by mouth daily. 11/17/21  Yes Ria Bush, MD  b complex vitamins capsule Take 1 capsule by mouth daily. [facility] 3 in 1 05/01/22  Yes Ria Bush, MD  desipramine (NORPRAMIN) 25 MG tablet Take 1 tablet (25 mg total) by mouth at bedtime. 05/19/22  Yes Ria Bush, MD  finasteride (PROSCAR) 5 MG tablet Take 1 tablet (5 mg total) by mouth daily. 02/02/22  Yes MacDiarmid, Nicki Reaper, MD  gabapentin (NEURONTIN) 300 MG capsule Take 1 capsule (300 mg total) by mouth 2 (two) times daily. 11/17/21  Yes Ria Bush, MD  phenytoin (DILANTIN) 100 MG ER capsule Take 2 capsules (200 mg total) by mouth 2 (two) times daily. 05/19/22  Yes Ria Bush, MD  ramipril (ALTACE) 10 MG capsule Take 1 capsule (10 mg total) by mouth daily. 11/17/21  Yes Ria Bush, MD  terazosin (HYTRIN) 10 MG capsule Take 1 capsule (10 mg total) by mouth daily. 11/17/21  Yes Ria Bush, MD    Physical Exam:  Vitals:   06/20/22 1600 06/20/22 1630 06/20/22 1800 06/20/22 2100  BP: (!) 170/89 (!) 186/101 (!) 194/97 (!) 177/101  Pulse: 67 68 70 68  Resp: '16 11 11 12  '$ Temp:      TempSrc:      SpO2: 97% 96% 96% 99%  Weight:      Height:        Constitutional: Awake alert and oriented x3, no associated distress.   Skin: no rashes, no lesions, good skin turgor noted. Eyes: Pupils are equally reactive to light.  No evidence of scleral icterus or conjunctival pallor.  ENMT: Moist mucous membranes noted.  Posterior pharynx clear of any exudate or lesions.   Neck: normal, supple, no masses, no thyromegaly.  No evidence of jugular venous distension.   Respiratory: clear to auscultation bilaterally, no wheezing, no crackles. Normal respiratory effort. No accessory muscle use.  Cardiovascular: Regular rate and rhythm, no murmurs / rubs / gallops. Pitting edema of the bilateral lower extremities from the feet up to the knees.  2+ pedal pulses. No carotid bruits.   Chest:   Nontender without crepitus or deformity.   Back:   Nontender without crepitus or deformity. Abdomen: Abdomen is soft and nontender.  No evidence of intra-abdominal masses.  Positive bowel sounds noted in all quadrants.   Musculoskeletal: No joint deformity upper and lower extremities. Good ROM, no contractures. Normal muscle tone.  Neurologic: Left eye internuclear ophthalmoplegia with inability to move medially.  Other CN intact.  Sensation intact.  Patient moving all 4 extremities spontaneously.  Patient is following all commands.  Patient is responsive to verbal stimuli.   Psychiatric: Patient exhibits normal mood with appropriate affect.  Patient seems to possess insight as to their current situation.    Data Reviewed:  I have personally reviewed and interpreted labs, imaging.  Significant findings are:  Coagulation profile reveals INR of 1.1, PT of 13.8, PTT of 29. CBC revealing white blood cell count of 6.6, hemoglobin 10.8, hematocrit 32.7, platelet  count of 257. Chemistry revealing sodium 137, potassium 3.9, chloride 109, bicarbonate 26, glucose 143, BUN 25, creatinine 1.09, albumin 2.5. Ethanol level less than 10. CT head without contrast revealing no evidence of acute cranial abnormality MRI brain without contrast revealing punctate acute/subacute nonhemorrhagic infarct of the posterior left lower midbrain near the 4th cranial nerve nucleus.  Remote lacunar infarct of the right thalamus and right cerebellum noted.  Mild atrophy and white matter disease reflecting the sequela of chronic microvascular ischemia.  EKG: Personally reviewed.  Rhythm is normal sinus rhythm with heart rate of 81 bpm.  No dynamic ST segment changes appreciated.   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 (HCC) Internuclear opthalmoplegia of the left eye on exam Patient outside of tPA window Performing serial neurologic checks Monitoring patient on telemetry Initiating antiplatelet  therapy including aspirin and Plavix Daily statin therapy will be initiated if LDL is greater than 70 Further imaging to include: CT angiogram of the head and neck Obtaining hemoglobin A1c and lipid panel in the morning Echocardiogram in the morning with bubble study PT, OT, SLP evaluation Permissive hypertension with as needed antihypertensives only to be given if blood pressure greater than 220/115 Inbox message sent to neurology requesting consultation in the morning, their assistance is appreciated.   Essential hypertension Permissive hypertension in the setting of acute stroke Holding home regimen of oral antihypertensives PRN intravenous antihypertensives for excessively elevated blood pressure in excess of 220/115    Nephrotic range proteinuria Recent Dx with [facility] on 06/17/2022 revealing 24 hr urine protein of 6800 consistent with nephrotic range proteinuria Peripheral edema on exam Presumed degree of hypertensive nephropathy as the cause with Tx of choice being an ACEi or an ARB Outpatient follow up with Nephrology Considering phenytoin is 90% protein bound I am curious as to how efficacious it has been for his seizure disorder with his nephrotic range proteinuria.    Mixed hyperlipidemia Continuing home regimen of lipid lowering therapy. We will continue to titrate statin therapy further if LDL found to be greater than 70   Seizure disorder ([place]) Continue home regimen of Dilantin for now Considering phenytoin is 90% protein bound I am curious as to how efficacious it has been for his seizure disorder with his nephrotic range proteinuria.  Obtaining phenytoin level   BPH (benign prostatic hyperplasia) Continue home regimen of terazosin  Peripheral neuropathy Thought to be due to longstanding phenytoin use per review of outpatient notes.  Continue home regimen of gabapentin       Code Status:  Full code  code status decision has been confirmed with:  patient Family Communication: deferred   Consults: Inbox message sent to Dr. Parke Simmers with Neurology  Severity of Illness:  The appropriate patient status for this patient is OBSERVATION. Observation status is judged to be reasonable and necessary in order to provide the required intensity of service to ensure the patient's safety. The patient's presenting symptoms, physical exam findings, and initial radiographic and laboratory data in the context of their medical condition is felt to place them at decreased risk for further clinical deterioration. Furthermore, it is anticipated that the patient will be medically stable for discharge from the hospital within 2 midnights of admission.   Author:  Vernelle Emerald MD  06/21/2022 12:04 AM

## 2022-06-21 ENCOUNTER — Observation Stay (HOSPITAL_COMMUNITY): Payer: Medicare Other

## 2022-06-21 ENCOUNTER — Observation Stay: Payer: Medicare Other

## 2022-06-21 DIAGNOSIS — I671 Cerebral aneurysm, nonruptured: Secondary | ICD-10-CM | POA: Diagnosis not present

## 2022-06-21 DIAGNOSIS — I10 Essential (primary) hypertension: Secondary | ICD-10-CM | POA: Diagnosis not present

## 2022-06-21 DIAGNOSIS — I639 Cerebral infarction, unspecified: Secondary | ICD-10-CM | POA: Diagnosis not present

## 2022-06-21 LAB — COMPREHENSIVE METABOLIC PANEL
ALT: 17 U/L (ref 0–44)
AST: 21 U/L (ref 15–41)
Albumin: 2.4 g/dL — ABNORMAL LOW (ref 3.5–5.0)
Alkaline Phosphatase: 125 U/L (ref 38–126)
Anion gap: 3 — ABNORMAL LOW (ref 5–15)
BUN: 21 mg/dL (ref 8–23)
CO2: 26 mmol/L (ref 22–32)
Calcium: 8 mg/dL — ABNORMAL LOW (ref 8.9–10.3)
Chloride: 110 mmol/L (ref 98–111)
Creatinine, Ser: 0.99 mg/dL (ref 0.61–1.24)
GFR, Estimated: >60 mL/min (ref >60)
Glucose, Bld: 87 mg/dL (ref 70–99)
Potassium: 3.9 mmol/L (ref 3.5–5.1)
Sodium: 139 mmol/L (ref 135–145)
Total Bilirubin: 0.4 mg/dL (ref 0.3–1.2)
Total Protein: 5 g/dL — ABNORMAL LOW (ref 6.5–8.1)

## 2022-06-21 LAB — CBC WITH DIFFERENTIAL/PLATELET
Abs Immature Granulocytes: 0.03 10*3/uL (ref 0.00–0.07)
Basophils Absolute: 0.1 10*3/uL (ref 0.0–0.1)
Basophils Relative: 1 %
Eosinophils Absolute: 0.3 10*3/uL (ref 0.0–0.5)
Eosinophils Relative: 4 %
HCT: 32.7 % — ABNORMAL LOW (ref 39.0–52.0)
Hemoglobin: 10.9 g/dL — ABNORMAL LOW (ref 13.0–17.0)
Immature Granulocytes: 0 %
Lymphocytes Relative: 21 %
Lymphs Abs: 1.5 10*3/uL (ref 0.7–4.0)
MCH: 29.4 pg (ref 26.0–34.0)
MCHC: 33.3 g/dL (ref 30.0–36.0)
MCV: 88.1 fL (ref 80.0–100.0)
Monocytes Absolute: 0.6 10*3/uL (ref 0.1–1.0)
Monocytes Relative: 9 %
Neutro Abs: 4.5 10*3/uL (ref 1.7–7.7)
Neutrophils Relative %: 65 %
Platelets: 250 10*3/uL (ref 150–400)
RBC: 3.71 MIL/uL — ABNORMAL LOW (ref 4.22–5.81)
RDW: 13.4 % (ref 11.5–15.5)
WBC: 7.1 10*3/uL (ref 4.0–10.5)
nRBC: 0 % (ref 0.0–0.2)

## 2022-06-21 LAB — LIPID PANEL
Cholesterol: 163 mg/dL (ref 0–200)
HDL: 48 mg/dL (ref >40)
LDL Cholesterol: 92 mg/dL (ref 0–99)
Total CHOL/HDL Ratio: 3.4 RATIO
Triglycerides: 114 mg/dL (ref <150)
VLDL: 23 mg/dL (ref 0–40)

## 2022-06-21 LAB — MAGNESIUM: Magnesium: 2.3 mg/dL (ref 1.7–2.4)

## 2022-06-21 MED ORDER — RAMIPRIL 10 MG PO CAPS
10.0000 mg | ORAL_CAPSULE | Freq: Every day | ORAL | Status: DC
  Administered 2022-06-21 – 2022-06-22 (×2): 10 mg via ORAL
  Filled 2022-06-21 (×3): qty 1

## 2022-06-21 MED ORDER — HYDRALAZINE HCL 20 MG/ML IJ SOLN: 20.0000 mg | Freq: Four times a day (QID) | INTRAMUSCULAR | Status: DC | PRN

## 2022-06-21 MED ORDER — CLOPIDOGREL BISULFATE 75 MG PO TABS
225.0000 mg | ORAL_TABLET | Freq: Once | ORAL | Status: AC
  Administered 2022-06-21: 225 mg via ORAL
  Filled 2022-06-21: qty 3

## 2022-06-21 MED ORDER — IOHEXOL 350 MG/ML SOLN
75.0000 mL | Freq: Once | INTRAVENOUS | Status: AC | PRN
  Administered 2022-06-21: 75 mL via INTRAVENOUS

## 2022-06-21 MED ORDER — ATORVASTATIN CALCIUM 20 MG PO TABS: 40.0000 mg | ORAL_TABLET | Freq: Every day | ORAL | Status: DC

## 2022-06-21 NOTE — Progress Notes (Signed)
OT Cancellation Note  Patient Details Name: Mark Holmes MRN: [id number] DOB: 04/10/1935   Cancelled Treatment:    Reason Eval/Treat Not Completed: Patient at procedure or test/ unavailable (Pt. just arrived to the floor from the ER. Nursing performing the inital assessment. Will reattempt the initial OT eval at a later time, or date.)  Harrel Carina, MS, OTR/L 06/21/2022, 3:16 PM

## 2022-06-21 NOTE — ED Notes (Signed)
Wife at bedside.

## 2022-06-21 NOTE — Evaluation (Addendum)
Physical Therapy Evaluation Patient Details Name: Mark Holmes MRN: [id number] DOB: [birth year] Today's Date: 06/21/2022  History of Present Illness  Pt is an 87 y/o M who presented on 06/20/22 with c/o sudden onset of double vision & impaired balance. MRI revealed a punctate acute/subacute nonhemorrhagic infarct in the posterior left lower midbrain near the 4th cranial nerve nucleus with remote lacunar infarcts of the right thalamus and right cerebellum. PMH: HLD, benign prostatic hyperplasia, seizure disorder, HTN, cervical radiculopathy (s/p ACDF C3-4), essential tremor, peripheral neuropathy  Clinical Impression  Pt seen for PT evaluation with pt's wife present for session. Prior to admission pt was living in a 2.5 level home but on main level with 4-5 steps with B rails to enter. Pt ambulates with SPC but primarily uses it for support when standing for long periods of time, denies falls, is still driving, and attending OPPT on [facility]. On this date, pt demonstrates decreased speed of movement in RLE & decreased strength. Pt attempts gait with RW but demonstrates unsteady gait requiring up to min assist for LOB. Provided pt with RW & pt able to ambulate with RW & supervision with cuing for proper use of AD; pt toilets with supervision. Pt c/o feeling generalized weakness throughout session. Discussed HHPT vs OPPT with pt & wife both voicing comfort with returning to OPPT services. Will continue to follow pt acutely to address balance, strengthening, and gait & stairs with LRAD.  Pt with c/o diplopia when looking straight & to R & elects to keep 1 eye closed throughout majority of session to alleviate this. Asked neurologist on recommendations if pt should wear eye patch, awaiting response.   Recommendations for follow up therapy are one component of a multi-disciplinary discharge planning process, led by the attending physician.  Recommendations may be updated based on patient status, additional  functional criteria and insurance authorization.  Follow Up Recommendations Outpatient PT (resume OPPT)      Assistance Recommended at Discharge Intermittent Supervision/Assistance  Patient can return home with the following  A little help with walking and/or transfers;Assistance with cooking/housework;A little help with bathing/dressing/bathroom;Assist for transportation;Help with stairs or ramp for entrance    Equipment Recommendations None recommended by PT (pt reports he has a RW at home he can use)  Recommendations for Other Services       Functional Status Assessment Patient has had a recent decline in their functional status and demonstrates the ability to make significant improvements in function in a reasonable and predictable amount of time.     Precautions / Restrictions Precautions Precautions: Fall Precaution Comments: diplopia Restrictions Weight Bearing Restrictions: No      Mobility  Bed Mobility Overal bed mobility: Needs Assistance Bed Mobility: Sit to Supine       Sit to supine: Modified independent (Device/Increase time), HOB elevated        Transfers Overall transfer level: Needs assistance Equipment used: Rolling walker (2 wheels) Transfers: Sit to/from Stand Sit to Stand: Min guard, Supervision           General transfer comment: cuing for safe hand placement when transferring STS with RW (cuing to not pull up on RW), pt is able to transfer STS with SPC with close supervision, does demonstrate posterior LOB x 1    Ambulation/Gait Ambulation/Gait assistance: Min assist, Min guard Gait Distance (Feet): 12 Feet (+ 150 ft + 75 ft + 75 ft) Assistive device: Straight cane, Rolling walker (2 wheels) Gait Pattern/deviations: Decreased step length - right,  Decreased dorsiflexion - right, Decreased dorsiflexion - left, Decreased step length - left, Decreased stride length Gait velocity: decreased     General Gait Details: Pt initially ambulates  to door & back with SPC with decreased balance & guarded gait with up to min assist 2/2 LOB. Provided pt with RW & educated pt throughout to ambulate within base of AD with good return demo, but poor return demo with depressing shoulders (pt keeps BLE shoulders elevated). Pt with improved balance when ambulating with RW.  Stairs            Wheelchair Mobility    Modified Rankin (Stroke Patients Only)       Balance Overall balance assessment: Needs assistance Sitting-balance support: Feet supported, Bilateral upper extremity supported Sitting balance-Leahy Scale: Good     Standing balance support: During functional activity, No upper extremity supported Standing balance-Leahy Scale: Fair Standing balance comment: close supervision when standing to perform hand hygiene & button/unbutton jeans when toileting                             Pertinent Vitals/Pain Pain Assessment Pain Assessment: No/denies pain    Home Living Family/patient expects to be discharged to:: Private residence Living Arrangements: Spouse/significant other Available Help at Discharge: Family;Available 24 hours/day Type of Home: House Home Access: Stairs to enter Entrance Stairs-Rails: Right;Left;Can reach both Entrance Stairs-Number of Steps: 4-5   Home Layout: Two level;Able to live on main level with bedroom/bathroom Home Equipment: Rolling Walker (2 wheels);Cane - single point      Prior Function Prior Level of Function : Independent/Modified Independent;Driving             Mobility Comments: Ambulatory with SPC but uses that primarily for support when standing for long periods of time, denies falls, still driving. Wears R ankle aircast (has for years 2/2 falling out of a tree years ago), going to OPPT on [employer] to focus on balance & strength. Pt endorses BLE/BUE peripheral neuropathy.       Hand Dominance   Dominant Hand: Right    Extremity/Trunk Assessment   Upper  Extremity Assessment Upper Extremity Assessment: Overall WFL for tasks assessed (BLE/BUE peripheral neuropathy)    Lower Extremity Assessment Lower Extremity Assessment: RLE deficits/detail (BLE/BUE peripheral neuropathy) RLE Deficits / Details: 3-/5 hip flexion & ankle dorsiflexion in sitting, slower movements when performing heel to shin RLE compared to LLE       Communication   Communication: HOH  Cognition Arousal/Alertness: Awake/alert Behavior During Therapy: WFL for tasks assessed/performed Overall Cognitive Status: Within Functional Limits for tasks assessed                                          General Comments General comments (skin integrity, edema, etc.): Pt with contient void & BM; vitals at end of session: SPO2 >90% on room air, HR 83 bpm, BP in LUE 181/96 mmHg MAP 121 -- MD/team notified    Exercises     Assessment/Plan    PT Assessment Patient needs continued PT services  PT Problem List Decreased strength;Decreased mobility;Decreased range of motion;Decreased activity tolerance;Cardiopulmonary status limiting activity;Decreased knowledge of use of DME;Decreased balance       PT Treatment Interventions DME instruction;Therapeutic exercise;Balance training;Neuromuscular re-education;Stair training;Gait training;Functional mobility training;Therapeutic activities;Patient/family education    PT Goals (Current goals can be found  in the Care Plan section)  Acute Rehab PT Goals Patient Stated Goal: get better, go home, resume OPPT PT Goal Formulation: With patient/family Time For Goal Achievement: 07/05/22 Potential to Achieve Goals: Good    Frequency 7X/week     Co-evaluation               AM-PAC PT "6 Clicks" Mobility  Outcome Measure Help needed turning from your back to your side while in a flat bed without using bedrails?: None Help needed moving from lying on your back to sitting on the side of a flat bed without using  bedrails?: None Help needed moving to and from a bed to a chair (including a wheelchair)?: A Little Help needed standing up from a chair using your arms (e.g., wheelchair or bedside chair)?: A Little Help needed to walk in hospital room?: A Little Help needed climbing 3-5 steps with a railing? : A Little 6 Click Score: 20    End of Session   Activity Tolerance: Patient tolerated treatment well Patient left: in bed;with call bell/phone within reach;with bed alarm set;with family/visitor present Nurse Communication: Mobility status (BP) PT Visit Diagnosis: Unsteadiness on feet (R26.81);Muscle weakness (generalized) (M62.81);Difficulty in walking, not elsewhere classified (R26.2);Other abnormalities of gait and mobility (R26.89);Hemiplegia and hemiparesis Hemiplegia - dominant/non-dominant: Dominant Hemiplegia - caused by: Cerebral infarction    Time: 0840-0906 PT Time Calculation (min) (ACUTE ONLY): 26 min   Charges:   PT Evaluation $PT Eval Moderate Complexity: 1 Mod PT Treatments $Therapeutic Activity: 8-22 mins        Lavone Nian, PT, DPT 06/21/22, 9:29 AM   Waunita Schooner 06/21/2022, 9:26 AM

## 2022-06-21 NOTE — Progress Notes (Signed)
SLP Cancellation Note  Patient Details Name: Mark Holmes MRN: [id number] DOB: [birth year]   Cancelled treatment:       Reason Eval/Treat Not Completed: SLP screened, no needs identified, will sign off   SLP consult received and appreciated. Chart review completed. Spoke at length with pt and wife. Both deny pt with any changes to speech/language/cognition. Per screening, pt A&Ox4. Speech is fluent and appropriate without s/sx anomia or dysarthria. RN made aware and in agreement with POC.  Time spent: 1035-1050  Cherrie Gauze, M.S., [facility] [phone] [place])   Quintella Baton 06/21/2022, 10:55 AM

## 2022-06-21 NOTE — ED Notes (Signed)
Patient given warm washcloth per request

## 2022-06-21 NOTE — Assessment & Plan Note (Addendum)
   Recent Dx with [facility] lab on 06/17/2022 revealing 24 hr urine protein of 6800 consistent with nephrotic range proteinuria  Peripheral edema on exam  Presumed degree of hypertensive nephropathy as the cause with Tx of choice being an ACEi or an ARB  Outpatient follow up with Nephrology  Considering phenytoin is 90% protein bound I am curious as to how efficacious it has been for his seizure disorder with his nephrotic range proteinuria.

## 2022-06-21 NOTE — Consult Note (Signed)
Neurology Consultation Reason for Consult: Stroke on MRI Requesting Physician: Eppie Gibson   CC: Diplopia  History is obtained from: Patient and chart review   HPI: Mark Holmes is a 87 y.o. male with a past medical history significant for hypertension, hyperlipidemia, C4/5 ACDF, remote seizures with ongoing confusional episodes, carpal tunnel syndrome, ulnar tunnel syndrome, severe neuropathy  He reports he woke up on 7/1 with diplopia.  He denies any other transient neurological symptoms, but is concerned about his progressive severe neuropathy (reports he has been told repeatedly that Dilantin is the etiology but also is interested in whether herbal supplements may restore his nerve function, and has not elected to substitute another antiseizure medication to date).  He also reports episodes at night where he feels like his wife looks markedly age and at that door while other objects and animals such as his dog look fine and healthy.  Additionally he has 30 seconds of some sort of weird feeling in his head with subsequent headache happening approximately once a week.  He denies any cardiac symptoms other than some mild chest pressure that he experienced while in the ED overnight and that he attributes to nerves.  He reports adherence to his home atorvastatin 20 mg daily  LKW: 6/31 evening tPA given?: No, out of the window on arrival  IA performed?: No, exam not consistent with LVO Premorbid modified rankin scale:      1 - No significant disability. Able to carry out all usual activities, despite some symptoms.  ROS: All other review of systems was negative except as noted in the HPI.   Past Medical History:  Diagnosis Date   Benign prostatic hypertrophy with elevated PSA    per Dr. Jacqlyn Larsen   History of CT scan of brain 1992   normal   Hyperglycemia 01/2006   102   Hyperlipemia    Hypertension    Seizure disorder [facility])    Past Surgical History:  Procedure Laterality  Date   ANTERIOR CERVICAL DECOMP/DISCECTOMY FUSION  05/2018   for myelopathy -C3/4 ([facility])   CARPAL TUNNEL RELEASE Right 06/2019   ([facility])   CATARACT EXTRACTION W/ INTRAOCULAR LENS IMPLANT Right 2012   [facility]   CATARACT EXTRACTION W/PHACO Left 01/14/2022   Procedure: CATARACT EXTRACTION PHACO AND INTRAOCULAR LENS PLACEMENT ([place]) LEFT;  Surgeon: Leandrew Koyanagi, MD;  Location: [place];  Service: Ophthalmology;  Laterality: Left;  7.55 01:03.6   History of EEG  1985   normal   hospitalization  1981   observation for seizuare   TONSILLECTOMY     TRANSURETHRAL RESECTION OF PROSTATE  10/2005   [name]   TRANSURETHRAL RESECTION OF PROSTATE  08/2017   (rpt) Jacqlyn Larsen   Current Outpatient Medications  Medication Instructions   Alpha-Lipoic Acid 600 MG TABS 1 tablet, Oral, Daily   aspirin-acetaminophen-caffeine (EXCEDRIN MIGRAINE) 250-250-65 MG per tablet 1 tablet, Oral, 2 times daily PRN   atorvastatin (LIPITOR) 20 mg, Oral, Daily   b complex vitamins capsule 1 capsule, Oral, Daily, [facility] 3 in 1   desipramine (NORPRAMIN) 25 mg, Oral, Daily at bedtime   finasteride (PROSCAR) 5 mg, Oral, Daily   gabapentin (NEURONTIN) 300 mg, Oral, 2 times daily   phenytoin (DILANTIN) 200 mg, Oral, 2 times daily   ramipril (ALTACE) 10 mg, Oral, Daily   terazosin (HYTRIN) 10 mg, Oral, Daily   Family History  Problem Relation Age of Onset   Febrile seizures Sister  Cancer Sister        lung/smoker   CAD Neg Hx    Social History:  reports that he has never smoked. He has never used smokeless tobacco. He reports that he does not drink alcohol and does not use drugs.  Exam: Current vital signs: BP (!) 181/96 (BP Location: Right Arm)   Pulse 78   Temp 98.4 F (36.9 C) (Oral)   Resp 20   Ht '5\' 7"'$  (1.702 m)   Wt 80.3 kg   SpO2 97%   BMI 27.72 kg/m  Vital signs in last 24 hours: Temp:  [97.9 F (36.6 C)-98.4 F (36.9 C)] 98.4 F (36.9  C) (07/02 0400) Pulse Rate:  [67-78] 78 (07/02 1020) Resp:  [11-20] 20 (07/02 1020) BP: (165-199)/(89-101) 181/96 (07/02 1020) SpO2:  [94 %-99 %] 97 % (07/02 1020) Weight:  [80.3 kg] 80.3 kg (07/01 1359)   Physical Exam  Constitutional: Appears well-developed and well-nourished.  Psych: Affect appropriate to situation, appropriate but sometimes tangential in his answers Eyes: No scleral injection HENT: No oropharyngeal obstruction.  MSK: Right foot orthotic in place and right wrist brace in place Cardiovascular: Perfusing extremities well Respiratory: Effort normal, non-labored breathing GI: Soft.  No distension. There is no tenderness.  Skin: Warm dry and intact visible skin  Neuro: Mental Status: Patient is awake, alert, oriented to person, place, month, year, and situation. Patient is able to give a clear and coherent history. No signs of aphasia or neglect Cranial Nerves: II: Visual Fields are full. Pupils are equal, round, and reactive to light.   III,IV, VI: EOM notable for left internuclear ophthalmoplegia V: Facial sensation is symmetric to light touch VII: Facial movement is notable for mild left facial droop VIII: hearing is intact to voice X: Uvula elevates symmetrically XI: Shoulder shrug is symmetric. XII: tongue is midline without atrophy or fasciculations.  Motor: Bulk is normal. 5/5 strength was present in all four extremities, other than left deltoid 4+/5 in bilateral hip flexion 4+/5 which he reports are chronic issues Sensory: Sensation is symmetric to light touch in the arms and legs without extinction to double simultaneous stimuli, did not assess for length dependent neuropathy but likely present based on history Deep Tendon Reflexes: Diminished throughout Cerebellar: FNF and HKS are intact bilaterally Gait:  Deferred   NIHSS total 2 Score breakdown: One-point for partial gaze palsy, one-point for mild left facial droop Performed at 12:30 PM  I  have reviewed labs in epic and the results pertinent to this consultation are:  Basic Metabolic Panel: Recent Labs  Lab 06/20/22 1403 06/21/22 0525  NA 137 139  K 3.9 3.9  CL 109 110  CO2 26 26  GLUCOSE 143* 87  BUN 25* 21  CREATININE 1.09 0.99  CALCIUM 7.9* 8.0*  MG  --  2.3   Albumin 2.4  CBC: Recent Labs  Lab 06/20/22 1403 06/21/22 0525  WBC 6.6 7.1  NEUTROABS 4.5 4.5  HGB 10.8* 10.9*  HCT 32.7* 32.7*  MCV 88.1 88.1  PLT 257 250    Coagulation Studies: Recent Labs    06/20/22 1403  LABPROT 13.8  INR 1.1     Lab Results  Component Value Date   CHOL 163 06/21/2022   HDL 48 06/21/2022   LDLCALC 92 06/21/2022   LDLDIRECT 147.4 02/22/2008   TRIG 114 06/21/2022   CHOLHDL 3.4 06/21/2022   No results found for: "HGBA1C"  Phenytoin level 8.2 at 2 PM on 7/20  I have reviewed  the images obtained:  MRI brain personally reviewed, agree with radiology: 1. Punctate acute/subacute nonhemorrhagic infarct in the posterior left lower midbrain near the fourth cranial nerve nucleus. 2. Remote lacunar infarcts of the right thalamus and right cerebellum. 3. Mild atrophy and white matter disease likely reflects the sequela of chronic microvascular ischemia. -In my personal review, suspect a punctate cerebellar stroke as well  CTA head and neck personally reviewed, agree with radiology: CT HEAD IMPRESSION:   1. Previously identified subcentimeter midbrain infarct not visible by CT. No other new acute intracranial abnormality. 2. Underlying atrophy with chronic small-vessel ischemic disease, with small remote right thalamic and right cerebellar infarcts.   CTA HEAD AND NECK IMPRESSION:   1. Negative CTA for large vessel occlusion or other emergent finding. 2. Atheromatous change about the right carotid bulb with associated stenosis of up to 50% by NASCET criteria. 3. Extensive intracranial atheromatous irregularity. Associated moderate to severe proximal left  P2 stenosis. 4. 4 mm left PCOM aneurysm. 5. Diffuse tortuosity of the major arterial vasculature of the head and neck, suggesting chronic underlying hypertension.  Impression: Patient's examination is consistent with a left internuclear ophthalmoplegia which matches well to the localization of his stroke on MRI.  Suspect atheroembolic stroke; small vessel disease would be another etiology but would not explain the cerebellar finding (which may be an artifact but I favor to be additional acute stroke in an embolic pattern of the posterior circulation).  Cardioembolic is formally possible as well and therefore I do recommend echocardiogram be completed  Recommendations: -Dual antiplatelet therapy for 21 days (full loading dose of 300 mg Plavix ordered, 75 mg Plavix daily for 21 days and aspirin 81 mg daily lifelong) -Telemetry monitoring; if no indication for anticoagulation found on hospitalization, patient should be discharged with cardiac event monitor -If an indication for anticoagulation is determined antiplatelet therapy should be discontinued -Increase home atorvastatin from 20 mg nightly to 40 mg nightly for goal LDL less than 70 -Goal A1c less than 7%, primary team or PCP to follow-up and adjust medications as needed -Goal normotension -Defer management of headaches and confusional episodes to outpatient neurologist Dr. Manuella Ghazi, patient was again counseled to substitute phenytoin for an alternative agent -Neurology will follow up echocardiogram but otherwise will be available on an as-needed basis going forward; please reach out if new neurological questions or concerns arise  Lesleigh Noe MD-PhD [facility] [phone] [facility], [facility], Teleneuro

## 2022-06-21 NOTE — Progress Notes (Signed)
PROGRESS NOTE    Mark Holmes  WGN:[id number] DOB: 03/10/1935 DOA: 06/20/2022 PCP: Ria Bush, MD   Assessment & Plan:   Principal Problem:   Acute stroke due to ischemia [facility]) Active Problems:   Essential hypertension   Nephrotic range proteinuria   Mixed hyperlipidemia   Seizure disorder ([place])   BPH (benign prostatic hyperplasia)   Peripheral neuropathy  Assessment and Plan: Acute CVA: w/ internuclear ophthalmoplegia of the left eye on exam as per admitting physician. Continue w/ neuro checks & tele. May need a cardiac event monitor at d/c. Echo ordered. Goal normotension as per neuro. Restart home dose of ramipril. IV hydralazine prn.  Aspirin, plavix x 21 days and then just aspirin lifelong as per neuro. Neuro recs apprec. Able to swallow & speech signed off. PT recs outpatient PT.  PCOM aneurysm: 73m as per CTA head & neck. Neuro surg consulted   HTN: restarted home dose of ramipril. IV hydralazine prn   Proteinuria: w/ 24 hr urine protein of 6510 as per [facility] lab on 06/17/22. Nephrotic range. Cr is WNL. Will need outpatient nephro f/u  HLD: continue on statin  Seizure disorder: continue on home dose of phenytoin. Phenytoin level is low at 8.2. Will need f/u outpatient w/ primary neurologist, Dr. SManuella Ghazi   DVT prophylaxis: lovenox  Code Status: full  Family Communication: discussed pt's care w/ pt's wife, BInez Catalina at bedside and answered her questions  Disposition Plan: likely d/c back home   Level of care: Telemetry Medical  Status is: Observation The patient remains OBS appropriate and will d/c before 2 midnights.   Consultants:  Neuro Neuro surg   Procedures:   Antimicrobials:    Subjective: Pt c/o intermittent blurry vision   Objective: Vitals:   06/20/22 2100 06/21/22 0005 06/21/22 0400 06/21/22 0720  BP: (!) 177/101 (!) 199/100 (!) 165/89 (!) 165/89  Pulse: 68 69 74 72  Resp: '12 17 16 20  '$ Temp:  98.1 F (36.7 C) 98.4 F (36.9 C)    TempSrc:  Oral Oral   SpO2: 99% 98% 98% 94%  Weight:      Height:        Intake/Output Summary (Last 24 hours) at 06/21/2022 0804 Last data filed at 06/21/2022 0239 Gross per 24 hour  Intake --  Output 450 ml  Net -450 ml   Filed Weights   06/20/22 1359  Weight: 80.3 kg    Examination:  General exam: Appears calm and comfortable  Respiratory system: Clear to auscultation. Respiratory effort normal. Cardiovascular system: S1 & S2+. No  rubs, gallops or clicks.  Gastrointestinal system: Abdomen is nondistended, soft and nontender. Normal bowel sounds heard. Central nervous system: Alert and oriented. Mild left facial droop Psychiatry: Judgement and insight appear normal. Mood & affect appropriate.     Data Reviewed: I have personally reviewed following labs and imaging studies  CBC: Recent Labs  Lab 06/20/22 1403 06/21/22 0525  WBC 6.6 7.1  NEUTROABS 4.5 4.5  HGB 10.8* 10.9*  HCT 32.7* 32.7*  MCV 88.1 88.1  PLT 257 2784  Basic Metabolic Panel: Recent Labs  Lab 06/20/22 1403 06/21/22 0525  NA 137 139  K 3.9 3.9  CL 109 110  CO2 26 26  GLUCOSE 143* 87  BUN 25* 21  CREATININE 1.09 0.99  CALCIUM 7.9* 8.0*  MG  --  2.3   GFR: Estimated Creatinine Clearance: 56.4 mL/min (by C-G formula based on SCr of 0.99 mg/dL). Liver Function Tests: Recent Labs  Lab 06/20/22 1403 06/21/22 0525  AST 21 21  ALT 17 17  ALKPHOS 122 125  BILITOT 0.5 0.4  PROT 5.2* 5.0*  ALBUMIN 2.5* 2.4*   No results for input(s): "LIPASE", "AMYLASE" in the last 168 hours. No results for input(s): "AMMONIA" in the last 168 hours. Coagulation Profile: Recent Labs  Lab 06/20/22 1403  INR 1.1   Cardiac Enzymes: No results for input(s): "CKTOTAL", "CKMB", "CKMBINDEX", "TROPONINI" in the last 168 hours. BNP (last 3 results) No results for input(s): "PROBNP" in the last 8760 hours. HbA1C: No results for input(s): "HGBA1C" in the last 72 hours. CBG: No results for input(s): "GLUCAP"  in the last 168 hours. Lipid Profile: Recent Labs    06/21/22 0525  CHOL 163  HDL 48  LDLCALC 92  TRIG 114  CHOLHDL 3.4   Thyroid Function Tests: No results for input(s): "TSH", "T4TOTAL", "FREET4", "T3FREE", "THYROIDAB" in the last 72 hours. Anemia Panel: No results for input(s): "VITAMINB12", "FOLATE", "FERRITIN", "TIBC", "IRON", "RETICCTPCT" in the last 72 hours. Sepsis Labs: No results for input(s): "PROCALCITON", "LATICACIDVEN" in the last 168 hours.  No results found for this or any previous visit (from the past 240 hour(s)).       Radiology Studies: CT ANGIO HEAD NECK W WO CM  Result Date: 06/21/2022 CLINICAL DATA:  Follow-up examination for stroke. EXAM: CT ANGIOGRAPHY HEAD AND NECK TECHNIQUE: Multidetector CT imaging of the head and neck was performed using the standard protocol during bolus administration of intravenous contrast. Multiplanar CT image reconstructions and MIPs were obtained to evaluate the vascular anatomy. Carotid stenosis measurements (when applicable) are obtained utilizing NASCET criteria, using the distal internal carotid diameter as the denominator. RADIATION DOSE REDUCTION: This exam was performed according to the departmental dose-optimization program which includes automated exposure control, adjustment of the mA and/or kV according to patient size and/or use of iterative reconstruction technique. CONTRAST:  22m OMNIPAQUE IOHEXOL 350 MG/ML SOLN COMPARISON:  Prior CT and MRI from 06/20/2022. FINDINGS: CT HEAD FINDINGS Brain: Generalized age-related cerebral atrophy with chronic small vessel ischemic disease. Remote lacunar infarct noted at the ventral right thalamus. Small remote right cerebellar infarct. Previously identified punctate ischemic infarct not visible by CT. No other acute large vessel territory infarct. No acute intracranial hemorrhage. No mass lesion or midline shift. No hydrocephalus or extra-axial fluid collection. Vascular: No hyperdense  vessel. Calcified atherosclerosis present about the skull base. Skull: Scalp soft tissues within normal limits.  Calvarium intact. Sinuses: Scattered mucosal thickening noted about the ethmoidal air cells. Mastoid air cells are clear. Orbits: Visualized globes and orbital soft tissues demonstrate no acute finding. Review of the MIP images confirms the above findings CTA NECK FINDINGS Aortic arch: Visualized aortic arch normal in caliber with standard branching pattern. Moderate atheromatous change about the arch itself. No high-grade stenosis about the origin the great vessels. Right carotid system: Right common and internal carotid arteries are diffusely tortuous. Atheromatous change about the right carotid bulb with associated stenosis of up to 50% by NASCET criteria. Right ICA patent distally without stenosis or dissection. Left carotid system: Left common and internal carotid arteries are diffusely tortuous. Atheromatous change about the left carotid bulb without significant stenosis. Left ICA patent distally without stenosis or dissection. Vertebral arteries: Both vertebral arteries arise from subclavian arteries. No significant proximal subclavian artery stenosis. Evaluation of the V2 segments somewhat limited bilaterally due to streak artifact from spinal fusion hardware. Visualized portions of the vertebral arteries patent without stenosis or dissection. Skeleton: No  discrete or worrisome osseous lesions. Prior ACDF at C3-4. Underlying advanced cervical spondylosis. Chronic compression deformity noted at the superior endplate of T2. Other neck: No other acute soft tissue abnormality within the neck. Upper chest: Visualized upper chest demonstrates no acute finding. Review of the MIP images confirms the above findings CTA HEAD FINDINGS Anterior circulation: Petrous segments patent bilaterally. Atheromatous change throughout the carotid siphons with associated mild to moderate narrowing bilaterally. 4 mm  aneurysm seen extending inferiorly from the supraclinoid left ICA (series 12, image 115). A1 segments patent bilaterally. Right A1 hypoplastic. Normal anterior communicating artery complex. Both ACAs are irregular but patent without high-grade stenosis. Moderate atheromatous irregularity throughout the M1 segments bilaterally without high-grade stenosis. No proximal MCA branch occlusion. Diffuse small vessel atheromatous irregularity throughout the MCA branches bilaterally. Posterior circulation: Both V4 segments patent to the vertebrobasilar junction without stenosis. Both PICA patent. Basilar patent to its distal aspect without significant stenosis. Superior cerebellar arteries patent bilaterally. Left PCA primarily supplied via the basilar. Focal moderate to severe proximal left P2 stenosis (series 15, image 20). Left PCA irregular but otherwise patent distally. Right PCA supplied via a hypoplastic P1 segment and robust right posterior crane artery. Extensive atheromatous irregularity throughout the right PCA without proximal high-grade stenosis. Venous sinuses: Patent allowing for timing the contrast bolus. Anatomic variants: As above. Review of the MIP images confirms the above findings IMPRESSION: CT HEAD IMPRESSION: 1. Previously identified subcentimeter midbrain infarct not visible by CT. No other new acute intracranial abnormality. 2. Underlying atrophy with chronic small-vessel ischemic disease, with small remote right thalamic and right cerebellar infarcts. CTA HEAD AND NECK IMPRESSION: 1. Negative CTA for large vessel occlusion or other emergent finding. 2. Atheromatous change about the right carotid bulb with associated stenosis of up to 50% by NASCET criteria. 3. Extensive intracranial atheromatous irregularity. Associated moderate to severe proximal left P2 stenosis. 4. 4 mm left PCOM aneurysm. 5. Diffuse tortuosity of the major arterial vasculature of the head and neck, suggesting chronic underlying  hypertension. Electronically Signed   By: Jeannine Boga M.D.   On: 06/21/2022 04:39   MR BRAIN WO CONTRAST  Result Date: 06/20/2022 CLINICAL DATA:  Diplopia. Dizziness. Loss of balance. EXAM: MRI HEAD WITHOUT CONTRAST TECHNIQUE: Multiplanar, multiecho pulse sequences of the brain and surrounding structures were obtained without intravenous contrast. COMPARISON:  CT head without contrast 06/20/2022 FINDINGS: Brain: A punctate focus of restricted diffusion is present in the posterior left lower midbrain near the fourth cranial nerve nucleus. Focal T2 hyperintensity is noted in the same location. No other acute infarcts are present. Mild atrophy and white matter changes are noted. The ventricles are of normal size. No significant extraaxial fluid collection is present. Remote lacunar infarct is present in the right thalamus. Dilated perivascular spaces are present in the basal ganglia. Remote lacunar infarcts are present in the right cerebellum. Vascular: Flow is present in the major intracranial arteries. Skull and upper cervical spine: The craniocervical junction is normal. Upper cervical spine is within normal limits. Marrow signal is unremarkable. Sinuses/Orbits: Mild mucosal thickening is present the inferior maxillary sinuses bilaterally. The paranasal sinuses and mastoid air cells are otherwise clear. Bilateral lens replacements are noted. Globes and orbits are otherwise unremarkable. IMPRESSION: 1. Punctate acute/subacute nonhemorrhagic infarct in the posterior left lower midbrain near the fourth cranial nerve nucleus. 2. Remote lacunar infarcts of the right thalamus and right cerebellum. 3. Mild atrophy and white matter disease likely reflects the sequela of chronic microvascular ischemia. These results  will be called to the ordering clinician or representative by the Radiologist Assistant, and communication documented in the PACS or [employer]. Electronically Signed   By: San Morelle  M.D.   On: 06/20/2022 18:40   CT HEAD WO CONTRAST  Result Date: 06/20/2022 CLINICAL DATA:  Dizziness EXAM: CT HEAD WITHOUT CONTRAST TECHNIQUE: Contiguous axial images were obtained from the base of the skull through the vertex without intravenous contrast. RADIATION DOSE REDUCTION: This exam was performed according to the departmental dose-optimization program which includes automated exposure control, adjustment of the mA and/or kV according to patient size and/or use of iterative reconstruction technique. COMPARISON:  11/01/2005 FINDINGS: Brain: No evidence of acute infarction, hemorrhage, hydrocephalus, extra-axial collection or mass lesion/mass effect. Scattered low-density changes within the periventricular and subcortical white matter compatible with chronic microvascular ischemic change. Mild diffuse cerebral volume loss. Vascular: Atherosclerotic calcifications involving the large vessels of the skull base. No unexpected hyperdense vessel. Skull: Normal. Negative for fracture or focal lesion. Sinuses/Orbits: Mucosal thickening within the bilateral maxillary sinuses and ethmoid air cells. Otherwise clear. Other: None. IMPRESSION: 1. No acute intracranial abnormality. 2. Chronic microvascular ischemic change and cerebral volume loss. 3. Paranasal sinus disease. Electronically Signed   By: Davina Poke D.O.   On: 06/20/2022 14:54        Scheduled Meds:   stroke: early stages of recovery book   Does not apply Once   aspirin EC  81 mg Oral Daily   atorvastatin  20 mg Oral Daily   clopidogrel  75 mg Oral Daily   desipramine  25 mg Oral QHS   enoxaparin (LOVENOX) injection  40 mg Subcutaneous Q24H   gabapentin  300 mg Oral BID   phenytoin  200 mg Oral BID   terazosin  10 mg Oral Daily   Continuous Infusions:   LOS: 0 days    Time spent: 35 mins     Wyvonnia Dusky, MD [facility] Pager [phone]  If 7PM-7AM, please contact night-coverage [email or web address] 06/21/2022,  8:04 AM

## 2022-06-21 NOTE — Consult Note (Signed)
Consulting Department: Emergency department  Primary Physician:  Ria Bush, MD  Chief Complaint: Left-sided posterior communicating artery aneurysm  History of Present Illness: 06/21/2022 Mark Holmes is a 87 y.o. male who presents with the chief complaint of left-sided posterior communicating artery aneurysm.  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  He denies any headaches.  He does have double vision, when looking to his right and notices this is mostly speed dependent.  Has not had any changes to his eyelids.  Denies any headache history, denies any smoking.  Denies hypertension.  Has no family history of ruptured subarachnoid hemorrhage  Review of Systems:  A 10 point review of systems is negative, except for the pertinent positives and negatives detailed in the HPI.  Past Medical History: Past Medical History:  Diagnosis Date   Benign prostatic hypertrophy with elevated PSA    per Dr. Jacqlyn Larsen   History of CT scan of brain 1992   normal   Hyperglycemia 01/2006   102   Hyperlipemia    Hypertension    Seizure disorder [facility])     Past Surgical History: Past Surgical History:  Procedure Laterality Date   ANTERIOR CERVICAL DECOMP/DISCECTOMY FUSION  05/2018   for myelopathy -C3/4 ([facility])   CARPAL TUNNEL RELEASE Right 06/2019   ([facility])   CATARACT EXTRACTION W/ INTRAOCULAR LENS IMPLANT Right 2012   [facility]   CATARACT EXTRACTION W/PHACO Left 01/14/2022   Procedure: CATARACT EXTRACTION PHACO AND INTRAOCULAR LENS PLACEMENT ([place]) LEFT;  Surgeon: Leandrew Koyanagi, MD;  Location: [place];  Service: Ophthalmology;  Laterality: Left;  7.55 01:03.6   History of EEG  1985   normal   hospitalization  1981    observation for seizuare   TONSILLECTOMY     TRANSURETHRAL RESECTION OF PROSTATE  10/2005   [name]   TRANSURETHRAL RESECTION OF PROSTATE  08/2017   (rpt) [facility]    Allergies: Allergies as of 06/20/2022 - Review Complete 06/20/2022  Allergen Reaction Noted   Dilaudid [hydromorphone hcl] Other (See Comments) 03/13/2012    Medications:  Current Facility-Administered Medications:     stroke: early stages of recovery book, , Does not apply, Once, Shalhoub, Sherryll Burger, MD   acetaminophen (TYLENOL) tablet 650 mg, 650 mg, Oral, Q6H PRN, 650 mg at 06/21/22 1514 **OR** acetaminophen (TYLENOL) suppository 650 mg, 650 mg, Rectal, Q6H PRN, Shalhoub, Sherryll Burger, MD   aspirin EC tablet 81 mg, 81 mg, Oral, Daily, Shalhoub, Sherryll Burger, MD, 81 mg at 06/21/22 1134   [START ON 06/22/2022] atorvastatin (LIPITOR) tablet 40 mg, 40 mg, Oral, QHS, Bhagat, Srishti L, MD   clopidogrel (PLAVIX) tablet 75 mg, 75 mg, Oral, Daily, Shalhoub, Sherryll Burger, MD, 75 mg at 06/21/22 1135   desipramine (NORPRAMIN) tablet 25 mg, 25 mg, Oral, QHS, Shalhoub, Sherryll Burger, MD   enoxaparin (LOVENOX) injection 40 mg, 40 mg, Subcutaneous, Q24H, Shalhoub, Sherryll Burger, MD, 40 mg at 06/21/22 0734   gabapentin (NEURONTIN) capsule 300 mg, 300 mg, Oral, BID, Shalhoub, Sherryll Burger, MD, 300 mg at 06/21/22 1135   hydrALAZINE (APRESOLINE) injection 20 mg, 20 mg, Intravenous, Q6H PRN, Wyvonnia Dusky, MD   ondansetron [facility]) tablet 4 mg, 4 mg, Oral, Q6H PRN **OR** ondansetron (ZOFRAN) injection 4 mg, 4 mg, Intravenous, Q6H PRN, Shalhoub, Sherryll Burger, MD  phenytoin (DILANTIN) ER capsule 200 mg, 200 mg, Oral, BID, Shalhoub, Sherryll Burger, MD, 200 mg at 06/21/22 1137   polyethylene glycol (MIRALAX / GLYCOLAX) packet 17 g, 17 g, Oral, Daily PRN, Shalhoub, Sherryll Burger, MD   ramipril (ALTACE) capsule 10 mg, 10 mg, Oral, Daily, Jimmye Norman, Jamiese M, MD, 10 mg at 06/21/22 1533   terazosin (HYTRIN) capsule 10 mg, 10 mg, Oral, Daily, Shalhoub, Sherryll Burger, MD   Social  History: Social History   Tobacco Use   Smoking status: Never   Smokeless tobacco: Never  Vaping Use   Vaping Use: Never used  Substance Use Topics   Alcohol use: No    Alcohol/week: 0.0 standard drinks of alcohol   Drug use: No    Family Medical History: Family History  Problem Relation Age of Onset   Febrile seizures Sister    Cancer Sister        lung/smoker   CAD Neg Hx     Physical Examination: Vitals:   06/21/22 1358 06/21/22 1458  BP: (!) 174/98 (!) 192/96  Pulse: 79 80  Resp: 16 18  Temp: 98.8 F (37.1 C) 98.7 F (37.1 C)  SpO2: 94% 98%     General: Patient is well developed, well nourished, calm, collected, and in no apparent distress.  NEUROLOGICAL:  General: In no acute distress.   Awake, alert, oriented to person, place, and time.  Pupils equal round and reactive to light.  Full Facial tone is symmetric.  Extraocular motions have abnormality when doing saccadic gaze to the right.  When closing his eye he does have full mobility of his left eye on examination and his eyelids are equal.  Pupils are equal.  Tongue protrusion is midline.  Bilateral upper extremities are full strength proximally and distally.  There is no pronator drift.  Language is conversant.  GCS: 15   Bilateral upper and lower extremity sensation is intact to light touch.  Imaging: Narrative & Impression  CLINICAL DATA:  Follow-up examination for stroke.   EXAM: CT ANGIOGRAPHY HEAD AND NECK   TECHNIQUE: Multidetector CT imaging of the head and neck was performed using the standard protocol during bolus administration of intravenous contrast. Multiplanar CT image reconstructions and MIPs were obtained to evaluate the vascular anatomy. Carotid stenosis measurements (when applicable) are obtained utilizing NASCET criteria, using the distal internal carotid diameter as the denominator.   RADIATION DOSE REDUCTION: This exam was performed according to the departmental  dose-optimization program which includes automated exposure control, adjustment of the mA and/or kV according to patient size and/or use of iterative reconstruction technique.   CONTRAST:  6m OMNIPAQUE IOHEXOL 350 MG/ML SOLN   COMPARISON:  Prior CT and MRI from 06/20/2022.   FINDINGS: CT HEAD FINDINGS   Brain: Generalized age-related cerebral atrophy with chronic small vessel ischemic disease. Remote lacunar infarct noted at the ventral right thalamus. Small remote right cerebellar infarct. Previously identified punctate ischemic infarct not visible by CT. No other acute large vessel territory infarct. No acute intracranial hemorrhage. No mass lesion or midline shift. No hydrocephalus or extra-axial fluid collection.   Vascular: No hyperdense vessel. Calcified atherosclerosis present about the skull base.   Skull: Scalp soft tissues within normal limits.  Calvarium intact.   Sinuses: Scattered mucosal thickening noted about the ethmoidal air cells. Mastoid air cells are clear.   Orbits: Visualized globes and orbital soft tissues demonstrate no acute finding.   Review of the MIP images confirms the above findings   CTA NECK  FINDINGS   Aortic arch: Visualized aortic arch normal in caliber with standard branching pattern. Moderate atheromatous change about the arch itself. No high-grade stenosis about the origin the great vessels.   Right carotid system: Right common and internal carotid arteries are diffusely tortuous. Atheromatous change about the right carotid bulb with associated stenosis of up to 50% by NASCET criteria. Right ICA patent distally without stenosis or dissection.   Left carotid system: Left common and internal carotid arteries are diffusely tortuous. Atheromatous change about the left carotid bulb without significant stenosis. Left ICA patent distally without stenosis or dissection.   Vertebral arteries: Both vertebral arteries arise from  subclavian arteries. No significant proximal subclavian artery stenosis. Evaluation of the V2 segments somewhat limited bilaterally due to streak artifact from spinal fusion hardware. Visualized portions of the vertebral arteries patent without stenosis or dissection.   Skeleton: No discrete or worrisome osseous lesions. Prior ACDF at C3-4. Underlying advanced cervical spondylosis. Chronic compression deformity noted at the superior endplate of T2.   Other neck: No other acute soft tissue abnormality within the neck.   Upper chest: Visualized upper chest demonstrates no acute finding.   Review of the MIP images confirms the above findings   CTA HEAD FINDINGS   Anterior circulation: Petrous segments patent bilaterally. Atheromatous change throughout the carotid siphons with associated mild to moderate narrowing bilaterally. 4 mm aneurysm seen extending inferiorly from the supraclinoid left ICA (series 12, image 115). A1 segments patent bilaterally. Right A1 hypoplastic. Normal anterior communicating artery complex. Both ACAs are irregular but patent without high-grade stenosis. Moderate atheromatous irregularity throughout the M1 segments bilaterally without high-grade stenosis. No proximal MCA branch occlusion. Diffuse small vessel atheromatous irregularity throughout the MCA branches bilaterally.   Posterior circulation: Both V4 segments patent to the vertebrobasilar junction without stenosis. Both PICA patent. Basilar patent to its distal aspect without significant stenosis. Superior cerebellar arteries patent bilaterally. Left PCA primarily supplied via the basilar. Focal moderate to severe proximal left P2 stenosis (series 15, image 20). Left PCA irregular but otherwise patent distally. Right PCA supplied via a hypoplastic P1 segment and robust right posterior crane artery. Extensive atheromatous irregularity throughout the right PCA without proximal high-grade stenosis.    Venous sinuses: Patent allowing for timing the contrast bolus.   Anatomic variants: As above.   Review of the MIP images confirms the above findings   IMPRESSION: CT HEAD IMPRESSION:   1. Previously identified subcentimeter midbrain infarct not visible by CT. No other new acute intracranial abnormality. 2. Underlying atrophy with chronic small-vessel ischemic disease, with small remote right thalamic and right cerebellar infarcts.   CTA HEAD AND NECK IMPRESSION:   1. Negative CTA for large vessel occlusion or other emergent finding. 2. Atheromatous change about the right carotid bulb with associated stenosis of up to 50% by NASCET criteria. 3. Extensive intracranial atheromatous irregularity. Associated moderate to severe proximal left P2 stenosis. 4. 4 mm left PCOM aneurysm. 5. Diffuse tortuosity of the major arterial vasculature of the head and neck, suggesting chronic underlying hypertension.     Electronically Signed   By: Jeannine Boga M.D.   On: 06/21/2022 04:39      I have personally reviewed the images and agree with the above interpretation.  Labs:    Latest Ref Rng & Units 06/21/2022    5:25 AM 06/20/2022    2:03 PM 01/20/2022    2:05 PM  CBC  WBC 4.0 - 10.5 K/uL 7.1  6.6  6.4  Hemoglobin 13.0 - 17.0 g/dL 10.9  10.8  11.2   Hematocrit 39.0 - 52.0 % 32.7  32.7  34.1   Platelets 150 - 400 K/uL 250  257  221.0        Assessment and Plan: Mark Holmes is a pleasant 87 y.o. male with current admission for a brainstem stroke.  He was worked up with neurology.  Has extensive imaging, as part of his work-up had vascular imaging which demonstrated left-sided P-comm aneurysm.  This appears to be asymptomatic at this time.  Denies any headache.  Denies any 3rd nerve palsy.  Does have ocular motion abnormalities which are attributed to an intranuclear ophthalmoplegia.  He denies any family history of aneurysm, does not smoke, does not have hypertension.  We  will plan on making a referral to a neurovascular specialist for evaluation.  At this time there is no acute need for treatment.  Defer stroke treatment to neurology.  I have discussed the condition with the patient, including showing the radiographs and discussing treatment options in layman's terms.  We will plan to arrange follow-up in the neurosurgery clinic as an outpatient.     Stevan Born, MD/MSCR Dept. of Neurosurgery

## 2022-06-21 NOTE — ED Notes (Signed)
Speech at bedside

## 2022-06-21 NOTE — ED Notes (Signed)
John, Psychologist, sport and exercise patient with using restroom

## 2022-06-22 ENCOUNTER — Telehealth: Payer: Self-pay | Admitting: *Deleted

## 2022-06-22 ENCOUNTER — Observation Stay (HOSPITAL_BASED_OUTPATIENT_CLINIC_OR_DEPARTMENT_OTHER)
Admit: 2022-06-22 | Discharge: 2022-06-22 | Disposition: A | Discharge status: Home / Self Care | Payer: Medicare Other | Attending: Internal Medicine | Admitting: Internal Medicine

## 2022-06-22 ENCOUNTER — Observation Stay (INDEPENDENT_AMBULATORY_CARE_PROVIDER_SITE_OTHER): Payer: Medicare Other

## 2022-06-22 DIAGNOSIS — I639 Cerebral infarction, unspecified: Secondary | ICD-10-CM

## 2022-06-22 DIAGNOSIS — I6389 Other cerebral infarction: Secondary | ICD-10-CM

## 2022-06-22 DIAGNOSIS — I10 Essential (primary) hypertension: Secondary | ICD-10-CM | POA: Diagnosis not present

## 2022-06-22 DIAGNOSIS — R809 Proteinuria, unspecified: Secondary | ICD-10-CM | POA: Diagnosis not present

## 2022-06-22 LAB — CBC
HCT: 33.4 % — ABNORMAL LOW (ref 39.0–52.0)
Hemoglobin: 11.2 g/dL — ABNORMAL LOW (ref 13.0–17.0)
MCH: 29.2 pg (ref 26.0–34.0)
MCHC: 33.5 g/dL (ref 30.0–36.0)
MCV: 87.2 fL (ref 80.0–100.0)
Platelets: 257 10*3/uL (ref 150–400)
RBC: 3.83 MIL/uL — ABNORMAL LOW (ref 4.22–5.81)
RDW: 13.2 % (ref 11.5–15.5)
WBC: 8.6 10*3/uL (ref 4.0–10.5)
nRBC: 0 % (ref 0.0–0.2)

## 2022-06-22 LAB — BASIC METABOLIC PANEL
Anion gap: 4 — ABNORMAL LOW (ref 5–15)
BUN: 20 mg/dL (ref 8–23)
CO2: 28 mmol/L (ref 22–32)
Calcium: 8.1 mg/dL — ABNORMAL LOW (ref 8.9–10.3)
Chloride: 110 mmol/L (ref 98–111)
Creatinine, Ser: 1.13 mg/dL (ref 0.61–1.24)
GFR, Estimated: >60 mL/min (ref >60)
Glucose, Bld: 89 mg/dL (ref 70–99)
Potassium: 4 mmol/L (ref 3.5–5.1)
Sodium: 142 mmol/L (ref 135–145)

## 2022-06-22 LAB — ECHOCARDIOGRAM COMPLETE BUBBLE STUDY
AR max vel: 1.34 cm2
AV Area VTI: 1.31 cm2
AV Area mean vel: 1.38 cm2
AV Mean grad: 6 mmHg
AV Peak grad: 12.3 mmHg
Ao pk vel: 1.75 m/s
Area-P 1/2: 2.79 cm2
S' Lateral: 2.12 cm

## 2022-06-22 LAB — HEMOGLOBIN A1C
Hgb A1c MFr Bld: 5.4 % (ref 4.8–5.6)
Mean Plasma Glucose: 108 mg/dL

## 2022-06-22 MED ORDER — ASPIRIN 81 MG PO TBEC: 81.0000 mg | DELAYED_RELEASE_TABLET | Freq: Every day | ORAL | 12 refills | Status: AC

## 2022-06-22 MED ORDER — CLOPIDOGREL BISULFATE 75 MG PO TABS: 75.0000 mg | ORAL_TABLET | Freq: Every day | ORAL | 0 refills | Status: AC

## 2022-06-22 MED ORDER — ATORVASTATIN CALCIUM 40 MG PO TABS: 40.0000 mg | ORAL_TABLET | Freq: Every day | ORAL | 0 refills | Status: DC

## 2022-06-22 NOTE — Progress Notes (Signed)
Physical Therapy Treatment Patient Details Name: Mark Holmes MRN: [id number] DOB: 1935/01/28 Today's Date: 06/22/2022   History of Present Illness Pt is an 87 y/o M who presented on 06/20/22 with c/o sudden onset of double vision & impaired balance. MRI revealed a punctate acute/subacute nonhemorrhagic infarct in the posterior left lower midbrain near the 4th cranial nerve nucleus with remote lacunar infarcts of the right thalamus and right cerebellum. PMH: HLD, benign prostatic hyperplasia, seizure disorder, HTN, cervical radiculopathy (s/p ACDF C3-4), essential tremor, peripheral neuropathy    PT Comments    Pt states his vision has improved and does not need eye patch at this time. Pt demonstrated safe ability to maneuver in and out of bed and complete transfers without LOB. Pt encouraged to utilize RW once d/c'd home due to occasional unsteadiness during changes in direction. Pt continues to c/o R foot pain from old injury, may benefit from podiatry consult for shoe insert.  Continue PT until pt cleared for d/c home with Out-pt PT.   Recommendations for follow up therapy are one component of a multi-disciplinary discharge planning process, led by the attending physician.  Recommendations may be updated based on patient status, additional functional criteria and insurance authorization.  Follow Up Recommendations  Outpatient PT     Assistance Recommended at Discharge Intermittent Supervision/Assistance  Patient can return home with the following A little help with walking and/or transfers;Assistance with cooking/housework;A little help with bathing/dressing/bathroom;Assist for transportation;Help with stairs or ramp for entrance   Equipment Recommendations  None recommended by PT    Recommendations for Other Services       Precautions / Restrictions Precautions Precautions: Fall Precaution Comments:  (slightly improved diplopia)     Mobility  Bed Mobility Overal bed  mobility: Modified Independent Bed Mobility: Supine to Sit, Sit to Supine     Supine to sit: Modified independent (Device/Increase time), HOB elevated Sit to supine: Modified independent (Device/Increase time), HOB elevated        Transfers Overall transfer level: Needs assistance Equipment used: None Transfers: Sit to/from Stand, Bed to chair/wheelchair/BSC Sit to Stand: Supervision           General transfer comment: Pt appears more steady today with functional mobility    Ambulation/Gait Ambulation/Gait assistance: Min guard, Supervision Gait Distance (Feet): 95 Feet Assistive device: None Gait Pattern/deviations: Decreased step length - right, Decreased dorsiflexion - right, Decreased dorsiflexion - left, Decreased step length - left, Decreased stride length       General Gait Details: Slightly unsteady at times yet negative LOB, Pt will benefit from use of AD to prevent LOB   Stairs             Wheelchair Mobility    Modified Rankin (Stroke Patients Only)       Balance Overall balance assessment: Needs assistance Sitting-balance support: Feet supported, Bilateral upper extremity supported Sitting balance-Leahy Scale: Good     Standing balance support: During functional activity, No upper extremity supported Standing balance-Leahy Scale: Fair                              Cognition Arousal/Alertness: Awake/alert Behavior During Therapy: WFL for tasks assessed/performed Overall Cognitive Status: Within Functional Limits for tasks assessed  Exercises      General Comments General comments (skin integrity, edema, etc.):  (Discussed pt's concern for edema in B Lower legs. Pt donned knee high TED hose to facilitate circulation. Also discussed possible benefits of Podiatry consult for improved shoe support for Right.)      Pertinent Vitals/Pain Pain Assessment Pain Assessment:  No/denies pain    Home Living                          Prior Function            PT Goals (current goals can now be found in the care plan section) Acute Rehab PT Goals Patient Stated Goal: get better, go home, resume OPPT Progress towards PT goals: Progressing toward goals    Frequency    7X/week      PT Plan Current plan remains appropriate    Co-evaluation              AM-PAC PT "6 Clicks" Mobility   Outcome Measure  Help needed turning from your back to your side while in a flat bed without using bedrails?: None Help needed moving from lying on your back to sitting on the side of a flat bed without using bedrails?: None Help needed moving to and from a bed to a chair (including a wheelchair)?: A Little Help needed standing up from a chair using your arms (e.g., wheelchair or bedside chair)?: A Little Help needed to walk in hospital room?: A Little Help needed climbing 3-5 steps with a railing? : A Little 6 Click Score: 20    End of Session Equipment Utilized During Treatment: Gait belt Activity Tolerance: Patient tolerated treatment well Patient left: in bed;with call bell/phone within reach;with bed alarm set;with family/visitor present Nurse Communication: Mobility status PT Visit Diagnosis: Unsteadiness on feet (R26.81);Muscle weakness (generalized) (M62.81);Difficulty in walking, not elsewhere classified (R26.2);Other abnormalities of gait and mobility (R26.89);Hemiplegia and hemiparesis Hemiplegia - dominant/non-dominant: Dominant Hemiplegia - caused by: Cerebral infarction     Time: 1230-1305 PT Time Calculation (min) (ACUTE ONLY): 35 min  Charges:  $Gait Training: 8-22 mins $Therapeutic Exercise: 8-22 mins                    Mikel Cella, PTA    Josie Dixon 06/22/2022, 4:23 PM

## 2022-06-22 NOTE — Progress Notes (Signed)
ZIO AT to be mailed out for CVA.  We will arrange for follow-up in our office thereafter.

## 2022-06-22 NOTE — Discharge Summary (Signed)
Physician Discharge Summary  Mark Holmes ZOX:[id number] DOB: 29-Dec-1934 DOA: 06/20/2022  PCP: Ria Bush, MD  Admit date: 06/20/2022 Discharge date: 06/22/2022  Admitted From: home Disposition:  home   Recommendations for Outpatient Follow-up:  Follow up with PCP in 1-2 weeks F/u w/ neuro, Dr. Manuella Ghazi, in 1 week F/u w/ neuro surg., Dr. Izora Ribas, in 1-2 weeks F/u w/ nephro to f/u 24 hr urine protein test results   Home Health: no  Equipment/Devices:  Discharge Condition: stable  CODE STATUS: full  Diet recommendation: Heart Healthy Brief/Interim Summary: HPI was taken from Dr. Cyd Silence: 87 year old male with past medical history of hyperlipidemia, benign prostatic hyperplasia, seizure disorder, hypertension, cervical radiculopathy (S/P ACDF C3-C4), essential tremor and peripheral neuropathy who presents to [facility] emergency department with complaints of sudden onset double vision.   Patient explains that he woke up this morning suddenly noticing that he had visual changes.  Patient states that upon ambulating to the bathroom he noticed that he was unsteady on his feet.  Upon making it to the bathroom and looking in the mirror he noticed that he was seeing double vision which intermittently resolves when he closes 1 eye.  Patient denies any associated focal weakness, facial droop or slurred speech.  Patient denies any associated headache, fever or sick contacts.  Last known normal was yesterday evening upon going to sleep.   Patient's symptoms continue to persist throughout the morning into the patient presented to [facility] emergency department for evaluation.   Upon evaluation in the emergency department ER provider was concerned for a possible stroke however patient was felt to be outside of the tPA window since patient woke up with the symptoms.  An MRI brain was performed which revealed a punctate acute/subacute nonhemorrhagic infarct in the posterior left lower midbrain near the 4th  cranial nerve nucleus with remote lacunar infarcts of the right thalamus and right cerebellum.  Patient was administered aspirin.  Neurology was not contacted by the ER provider considering the time of diagnosis.  The hospitalist group was then called to assess the patient for admission to the hospital.  As per Dr. Jimmye Norman 7/2-06/22/22: Pt was found to have CVA w/ internuclear ophthalmoplegia of the left eye on exam. Pt was started on plavix, aspirin x 21 days and then aspirin lifelong as per neuro. Pt's home dose of atorvastatin was increased from 20 to '40mg'$  as per neuro. 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 Pt will need f/u w/ neuro surg outpatient. Pt verbalized his understanding. Echo showed EF is 581-19% grade I diastolic function, no regional wall motion abnormalities, & no atrial level shunt was detected. For more information, please see previous progress/consult notes.    Discharge Diagnoses:  Principal Problem:   Acute stroke due to ischemia ([facility] Active Problems:   Essential hypertension   Nephrotic range proteinuria   Mixed hyperlipidemia   Seizure disorder (HCC)   BPH (benign prostatic hyperplasia)   Peripheral neuropathy  Acute CVA: w/ internuclear ophthalmoplegia of the left eye on exam as per admitting physician. Continue w/ neuro checks & tele. Zio patch will mailed to pt's house & cardio will f/u. Echo shows EF 514-78% grade I diastolic dysfunction, no atrial shunts detected. Goal normotension as per neuro. Continue home dose of ramipril. IV hydralazine prn.  Aspirin, plavix x 21 days and then just aspirin lifelong as per neuro. Neuro recs apprec. Able to swallow & speech  signed off. PT recs outpatient PT.  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 Neuro surg consulted   HTN: restarted home dose of ramipril. IV hydralazine prn   Proteinuria: w/ 24 hr urine protein of 6510 as per [facility] lab on 06/17/22.  Nephrotic range. Cr is WNL. Will need outpatient nephro f/u  HLD: continue on statin  Seizure disorder: continue on home dose of phenytoin. Phenytoin level is low at 8.2. Will need f/u outpatient w/ primary neurologist, Dr. SManuella Ghazi  Discharge Instructions  Discharge Instructions     Diet - low sodium heart healthy   Complete by: As directed    Discharge instructions   Complete by: As directed    F/u w/ PCP in 1-2 weeks. F/u w/ neuro, Dr. SManuella Ghazi in 1 week. F/u w/ neuro surg., Dr. YIzora Ribas in 1-2 weeks. A zio patch/heart monitor will be mailed to your house with instructions and cardiology will reach out to you in regards to follow-up. Continue on aspirin & plavix x 19 days and then aspirin only lifelong as per neurology   Discharge instructions   Complete by: As directed    F/u w/ nephro, Dr. SCandiss Norseor Dr. LHolley Raringor a nephrologist of your choice, in regards to 24 hr urine protein test results   Increase activity slowly   Complete by: As directed       Allergies as of 06/22/2022       Reactions   Dilaudid [hydromorphone Hcl] Other (See Comments)   hallucinations        Medication List     TAKE these medications    Alpha-Lipoic Acid 600 MG Tabs Take 1 tablet by mouth daily.   aspirin EC 81 MG tablet Take 1 tablet (81 mg total) by mouth daily. Swallow whole. Start taking on: June 23, 2022   aspirin-acetaminophen-caffeine 250-250-65 MG tablet Commonly known as: EXCEDRIN MIGRAINE Take 1 tablet by mouth 2 (two) times daily as needed.   atorvastatin 40 MG tablet Commonly known as: LIPITOR Take 1 tablet (40 mg total) by mouth at bedtime. What changed:  medication strength how much to take when to take this   b complex vitamins capsule Take 1 capsule by mouth daily. [facility] 3 in 1   clopidogrel 75 MG tablet Commonly known as: PLAVIX Take 1 tablet (75 mg total) by mouth daily for 19 days. Start taking on: June 23, 2022   desipramine 25 MG  tablet Commonly known as: NORPRAMIN Take 1 tablet (25 mg total) by mouth at bedtime.   finasteride 5 MG tablet Commonly known as: PROSCAR Take 1 tablet (5 mg total) by mouth daily.   gabapentin 300 MG capsule Commonly known as: NEURONTIN Take 1 capsule (300 mg total) by mouth 2 (two) times daily.   phenytoin 100 MG ER capsule Commonly known as: DILANTIN Take 2 capsules (200 mg total) by mouth 2 (two) times daily.   ramipril 10 MG capsule Commonly known as: ALTACE Take 1 capsule (10 mg total) by mouth daily.   terazosin 10 MG capsule Commonly known as: HYTRIN Take 1 capsule (10 mg total) by mouth daily.        Follow-up Information     SVladimir Crofts MD Follow up in 1 week(s).   Specialty: Neurology Contact information: [address] [id number]         YMeade Maw MD Follow up.   Specialty: Neurosurgery Why: f/u in 1-2  weeks for 51m left-sided P-comm aneurysm Contact information: [address] [id number]               Allergies  Allergen Reactions   Dilaudid [Hydromorphone Hcl] Other (See Comments)    hallucinations    Consultations: Neuro Neuro surg   Procedures/Studies: ECHOCARDIOGRAM COMPLETE BUBBLE STUDY  Result Date: 06/22/2022    ECHOCARDIOGRAM REPORT   Patient Name:   CSALLIE MAKERDate of Exam: 06/22/2022 Medical Rec #:  [id number]           Height:       67.0 in Accession #:    [id number]          Weight:       177.0 lb Date of Birth:  104-May-1936           BSA:          1.920 m Patient Age:    89years             BP:           186/90 mmHg Patient Gender: M                    HR:           68 bpm. Exam Location:  [facility] Procedure: 2D Echo, Cardiac Doppler, Color Doppler and Saline Contrast Bubble            Study Indications:     I63.9 Stroke  History:         Patient has prior history of Echocardiogram examinations. Risk                   Factors:Dyslipidemia and Hypertension.  Sonographer:     JRosalia HammersReferring Phys:  [id number] EmeraldDiagnosing Phys: MKathlyn SacramentoMD IMPRESSIONS  1. Left ventricular ejection fraction, by estimation, is 55 to 60%. The left ventricle has normal function. The left ventricle has no regional wall motion abnormalities. There is moderate left ventricular hypertrophy. Left ventricular diastolic parameters are consistent with Grade I diastolic dysfunction (impaired relaxation).  2. Right ventricular systolic function is normal. The right ventricular size is normal. There is mildly elevated pulmonary artery systolic pressure.  3. Left atrial size was mildly dilated.  4. The mitral valve is normal in structure. Mild mitral valve regurgitation. No evidence of mitral stenosis.  5. The aortic valve is normal in structure. Aortic valve regurgitation is not visualized. Mild aortic valve stenosis. Aortic valve area, by VTI measures 1.31 cm. Aortic valve mean gradient measures 6.0 mmHg.  6. The inferior vena cava is normal in size with <50% respiratory variability, suggesting right atrial pressure of 8 mmHg. FINDINGS  Left Ventricle: Left ventricular ejection fraction, by estimation, is 55 to 60%. The left ventricle has normal function. The left ventricle has no regional wall motion abnormalities. The left ventricular internal cavity size was normal in size. There is  moderate left ventricular hypertrophy. Left ventricular diastolic parameters are consistent with Grade I diastolic dysfunction (impaired relaxation). Right Ventricle: The right ventricular size is normal. No increase in right ventricular wall thickness. Right ventricular systolic function is normal. There is mildly elevated pulmonary artery systolic pressure. The tricuspid regurgitant velocity is 2.68  m/s, and with an assumed right atrial pressure of 8 mmHg, the estimated right ventricular systolic pressure is 382.4mmHg. Left Atrium: Left  atrial size was mildly dilated. Right Atrium: Right atrial  size was normal in size. Pericardium: There is no evidence of pericardial effusion. Mitral Valve: The mitral valve is normal in structure. There is mild calcification of the mitral valve leaflet(s). Mild mitral annular calcification. Mild mitral valve regurgitation. No evidence of mitral valve stenosis. Tricuspid Valve: The tricuspid valve is normal in structure. Tricuspid valve regurgitation is mild . No evidence of tricuspid stenosis. Aortic Valve: The aortic valve is normal in structure. Aortic valve regurgitation is not visualized. Mild aortic stenosis is present. Aortic valve mean gradient measures 6.0 mmHg. Aortic valve peak gradient measures 12.2 mmHg. Aortic valve area, by VTI measures 1.31 cm. Pulmonic Valve: The pulmonic valve was normal in structure. Pulmonic valve regurgitation is mild. No evidence of pulmonic stenosis. Aorta: The aortic root is normal in size and structure. Venous: The inferior vena cava is normal in size with less than 50% respiratory variability, suggesting right atrial pressure of 8 mmHg. IAS/Shunts: No atrial level shunt detected by color flow Doppler. Agitated saline contrast was given intravenously to evaluate for intracardiac shunting.  LEFT VENTRICLE PLAX 2D LVIDd:         2.97 cm   Diastology LVIDs:         2.12 cm   LV e' medial:    5.66 cm/s LV PW:         1.38 cm   LV E/e' medial:  11.4 LV IVS:        2.04 cm   LV e' lateral:   4.13 cm/s LVOT diam:     1.90 cm   LV E/e' lateral: 15.6 LV SV:         56 LV SV Index:   29 LVOT Area:     2.84 cm  RIGHT VENTRICLE RV Basal diam:  3.88 cm RV S prime:     18.90 cm/s LEFT ATRIUM            Index        RIGHT ATRIUM           Index LA diam:      2.90 cm  1.51 cm/m   RA Area:     18.30 cm LA Vol (A2C): 101.0 ml 52.61 ml/m  RA Volume:   44.30 ml  23.07 ml/m LA Vol (A4C): 72.8 ml  37.92 ml/m  AORTIC VALVE                     PULMONIC VALVE AV Area (Vmax):    1.34 cm       PR End Diast Vel: 3.59 msec AV Area (Vmean):   1.38 cm AV Area (VTI):     1.31 cm AV Vmax:           175.00 cm/s AV Vmean:          109.000 cm/s AV VTI:            0.430 m AV Peak Grad:      12.2 mmHg AV Mean Grad:      6.0 mmHg LVOT Vmax:         82.80 cm/s LVOT Vmean:        53.200 cm/s LVOT VTI:          0.199 m LVOT/AV VTI ratio: 0.46  AORTA Ao Root diam: 3.30 cm MITRAL VALVE                TRICUSPID VALVE MV Area (PHT): 2.79 cm     TR Peak grad:   28.7  mmHg MV Decel Time: 272 msec     TR Vmax:        268.00 cm/s MV E velocity: 64.30 cm/s MV A velocity: 133.00 cm/s  SHUNTS MV E/A ratio:  0.48         Systemic VTI:  0.20 m                             Systemic Diam: 1.90 cm Kathlyn Sacramento MD Electronically signed by Kathlyn Sacramento MD Signature Date/Time: 06/22/2022/1:19:33 PM    Final    CT ANGIO HEAD NECK W WO CM  Result Date: 06/21/2022 CLINICAL DATA:  Follow-up examination for stroke. EXAM: CT ANGIOGRAPHY HEAD AND NECK TECHNIQUE: Multidetector CT imaging of the head and neck was performed using the standard protocol during bolus administration of intravenous contrast. Multiplanar CT image reconstructions and MIPs were obtained to evaluate the vascular anatomy. Carotid stenosis measurements (when applicable) are obtained utilizing NASCET criteria, using the distal internal carotid diameter as the denominator. RADIATION DOSE REDUCTION: This exam was performed according to the departmental dose-optimization program which includes automated exposure control, adjustment of the mA and/or kV according to patient size and/or use of iterative reconstruction technique. CONTRAST:  56m OMNIPAQUE IOHEXOL 350 MG/ML SOLN COMPARISON:  Prior CT and MRI from 06/20/2022. FINDINGS: CT HEAD FINDINGS Brain: Generalized age-related cerebral atrophy with chronic small vessel ischemic disease. Remote lacunar infarct noted at the ventral right thalamus. Small remote right cerebellar infarct. Previously identified punctate ischemic  infarct not visible by CT. No other acute large vessel territory infarct. No acute intracranial hemorrhage. No mass lesion or midline shift. No hydrocephalus or extra-axial fluid collection. Vascular: No hyperdense vessel. Calcified atherosclerosis present about the skull base. Skull: Scalp soft tissues within normal limits.  Calvarium intact. Sinuses: Scattered mucosal thickening noted about the ethmoidal air cells. Mastoid air cells are clear. Orbits: Visualized globes and orbital soft tissues demonstrate no acute finding. Review of the MIP images confirms the above findings CTA NECK FINDINGS Aortic arch: Visualized aortic arch normal in caliber with standard branching pattern. Moderate atheromatous change about the arch itself. No high-grade stenosis about the origin the great vessels. Right carotid system: Right common and internal carotid arteries are diffusely tortuous. Atheromatous change about the right carotid bulb with associated stenosis of up to 50% by NASCET criteria. Right ICA patent distally without stenosis or dissection. Left carotid system: Left common and internal carotid arteries are diffusely tortuous. Atheromatous change about the left carotid bulb without significant stenosis. Left ICA patent distally without stenosis or dissection. Vertebral arteries: Both vertebral arteries arise from subclavian arteries. No significant proximal subclavian artery stenosis. Evaluation of the V2 segments somewhat limited bilaterally due to streak artifact from spinal fusion hardware. Visualized portions of the vertebral arteries patent without stenosis or dissection. Skeleton: No discrete or worrisome osseous lesions. Prior ACDF at C3-4. Underlying advanced cervical spondylosis. Chronic compression deformity noted at the superior endplate of T2. Other neck: No other acute soft tissue abnormality within the neck. Upper chest: Visualized upper chest demonstrates no acute finding. Review of the MIP images  confirms the above findings CTA HEAD FINDINGS Anterior circulation: Petrous segments patent bilaterally. Atheromatous change throughout the carotid siphons with associated mild to moderate narrowing bilaterally. 4 mm aneurysm seen extending inferiorly from the supraclinoid left ICA (series 12, image 115). A1 segments patent bilaterally. Right A1 hypoplastic. Normal anterior communicating artery complex. Both ACAs are irregular but patent without high-grade  stenosis. Moderate atheromatous irregularity throughout the M1 segments bilaterally without high-grade stenosis. No proximal MCA branch occlusion. Diffuse small vessel atheromatous irregularity throughout the MCA branches bilaterally. Posterior circulation: Both V4 segments patent to the vertebrobasilar junction without stenosis. Both PICA patent. Basilar patent to its distal aspect without significant stenosis. Superior cerebellar arteries patent bilaterally. Left PCA primarily supplied via the basilar. Focal moderate to severe proximal left P2 stenosis (series 15, image 20). Left PCA irregular but otherwise patent distally. Right PCA supplied via a hypoplastic P1 segment and robust right posterior crane artery. Extensive atheromatous irregularity throughout the right PCA without proximal high-grade stenosis. Venous sinuses: Patent allowing for timing the contrast bolus. Anatomic variants: As above. Review of the MIP images confirms the above findings IMPRESSION: CT HEAD IMPRESSION: 1. Previously identified subcentimeter midbrain infarct not visible by CT. No other new acute intracranial abnormality. 2. Underlying atrophy with chronic small-vessel ischemic disease, with small remote right thalamic and right cerebellar infarcts. CTA HEAD AND NECK IMPRESSION: 1. Negative CTA for large vessel occlusion or other emergent finding. 2. Atheromatous change about the right carotid bulb with associated stenosis of up to 50% by NASCET criteria. 3. Extensive intracranial  atheromatous irregularity. Associated moderate to severe proximal left P2 stenosis. 4. 4 mm left PCOM aneurysm. 5. Diffuse tortuosity of the major arterial vasculature of the head and neck, suggesting chronic underlying hypertension. Electronically Signed   By: Jeannine Boga M.D.   On: 06/21/2022 04:39   MR BRAIN WO CONTRAST  Result Date: 06/20/2022 CLINICAL DATA:  Diplopia. Dizziness. Loss of balance. EXAM: MRI HEAD WITHOUT CONTRAST TECHNIQUE: Multiplanar, multiecho pulse sequences of the brain and surrounding structures were obtained without intravenous contrast. COMPARISON:  CT head without contrast 06/20/2022 FINDINGS: Brain: A punctate focus of restricted diffusion is present in the posterior left lower midbrain near the fourth cranial nerve nucleus. Focal T2 hyperintensity is noted in the same location. No other acute infarcts are present. Mild atrophy and white matter changes are noted. The ventricles are of normal size. No significant extraaxial fluid collection is present. Remote lacunar infarct is present in the right thalamus. Dilated perivascular spaces are present in the basal ganglia. Remote lacunar infarcts are present in the right cerebellum. Vascular: Flow is present in the major intracranial arteries. Skull and upper cervical spine: The craniocervical junction is normal. Upper cervical spine is within normal limits. Marrow signal is unremarkable. Sinuses/Orbits: Mild mucosal thickening is present the inferior maxillary sinuses bilaterally. The paranasal sinuses and mastoid air cells are otherwise clear. Bilateral lens replacements are noted. Globes and orbits are otherwise unremarkable. IMPRESSION: 1. Punctate acute/subacute nonhemorrhagic infarct in the posterior left lower midbrain near the fourth cranial nerve nucleus. 2. Remote lacunar infarcts of the right thalamus and right cerebellum. 3. Mild atrophy and white matter disease likely reflects the sequela of chronic microvascular  ischemia. These results will be called to the ordering clinician or representative by the Radiologist Assistant, and communication documented in the PACS or [employer]. Electronically Signed   By: San Morelle M.D.   On: 06/20/2022 18:40   CT HEAD WO CONTRAST  Result Date: 06/20/2022 CLINICAL DATA:  Dizziness EXAM: CT HEAD WITHOUT CONTRAST TECHNIQUE: Contiguous axial images were obtained from the base of the skull through the vertex without intravenous contrast. RADIATION DOSE REDUCTION: This exam was performed according to the departmental dose-optimization program which includes automated exposure control, adjustment of the mA and/or kV according to patient size and/or use of iterative reconstruction technique. COMPARISON:  11/01/2005 FINDINGS: Brain: No evidence of acute infarction, hemorrhage, hydrocephalus, extra-axial collection or mass lesion/mass effect. Scattered low-density changes within the periventricular and subcortical white matter compatible with chronic microvascular ischemic change. Mild diffuse cerebral volume loss. Vascular: Atherosclerotic calcifications involving the large vessels of the skull base. No unexpected hyperdense vessel. Skull: Normal. Negative for fracture or focal lesion. Sinuses/Orbits: Mucosal thickening within the bilateral maxillary sinuses and ethmoid air cells. Otherwise clear. Other: None. IMPRESSION: 1. No acute intracranial abnormality. 2. Chronic microvascular ischemic change and cerebral volume loss. 3. Paranasal sinus disease. Electronically Signed   By: Davina Poke D.O.   On: 06/20/2022 14:54   (Echo, Carotid, EGD, Colonoscopy, ERCP)    Subjective: Pt denies any complaints    Discharge Exam: Vitals:   06/22/22 0749 06/22/22 1454  BP: (!) 186/90 (!) 146/81  Pulse: 74 87  Resp:  17  Temp:  98.3 F (36.8 C)  SpO2: 95% 96%   Vitals:   06/22/22 0517 06/22/22 0748 06/22/22 0749 06/22/22 1454  BP: (!) 165/94 (!) 192/95 (!) 186/90 (!)  146/81  Pulse: 94 75 74 87  Resp: '16 18  17  '$ Temp: 98.5 F (36.9 C) 97.7 F (36.5 C)  98.3 F (36.8 C)  TempSrc:    Oral  SpO2: 97% 93% 95% 96%  Weight:      Height:        General: Pt is alert, awake, not in acute distress Cardiovascular: S1/S2 +, no rubs, no gallops Respiratory: CTA bilaterally, no wheezing, no rhonchi Abdominal: Soft, NT, ND, bowel sounds + Extremities: no edema, no cyanosis    The results of significant diagnostics from this hospitalization (including imaging, microbiology, ancillary and laboratory) are listed below for reference.     Microbiology: No results found for this or any previous visit (from the past 240 hour(s)).   Labs: BNP (last 3 results) No results for input(s): "BNP" in the last 8760 hours. Basic Metabolic Panel: Recent Labs  Lab 06/20/22 1403 06/21/22 0525 06/22/22 0433  NA 137 139 142  K 3.9 3.9 4.0  CL 109 110 110  CO2 '26 26 28  '$ GLUCOSE 143* 87 89  BUN 25* 21 20  CREATININE 1.09 0.99 1.13  CALCIUM 7.9* 8.0* 8.1*  MG  --  2.3  --    Liver Function Tests: Recent Labs  Lab 06/20/22 1403 06/21/22 0525  AST 21 21  ALT 17 17  ALKPHOS 122 125  BILITOT 0.5 0.4  PROT 5.2* 5.0*  ALBUMIN 2.5* 2.4*   No results for input(s): "LIPASE", "AMYLASE" in the last 168 hours. No results for input(s): "AMMONIA" in the last 168 hours. CBC: Recent Labs  Lab 06/20/22 1403 06/21/22 0525 06/22/22 0433  WBC 6.6 7.1 8.6  NEUTROABS 4.5 4.5  --   HGB 10.8* 10.9* 11.2*  HCT 32.7* 32.7* 33.4*  MCV 88.1 88.1 87.2  PLT 257 250 257   Cardiac Enzymes: No results for input(s): "CKTOTAL", "CKMB", "CKMBINDEX", "TROPONINI" in the last 168 hours. BNP: Invalid input(s): "POCBNP" CBG: No results for input(s): "GLUCAP" in the last 168 hours. D-Dimer No results for input(s): "DDIMER" in the last 72 hours. Hgb A1c Recent Labs    06/21/22 0525  HGBA1C 5.4   Lipid Profile Recent Labs    06/21/22 0525  CHOL 163  HDL 48  LDLCALC 92   TRIG 114  CHOLHDL 3.4   Thyroid function studies No results for input(s): "TSH", "T4TOTAL", "T3FREE", "THYROIDAB" in the last 72 hours.  Invalid input(s): "  FREET3" Anemia work up No results for input(s): "VITAMINB12", "FOLATE", "FERRITIN", "TIBC", "IRON", "RETICCTPCT" in the last 72 hours. Urinalysis    Component Value Date/Time   COLORURINE YELLOW 06/03/2022 0818   APPEARANCEUR Sl Cloudy (A) 06/03/2022 0818   APPEARANCEUR Clear 01/17/2019 1320   LABSPEC 1.020 06/03/2022 0818   PHURINE 5.5 06/03/2022 0818   GLUCOSEU NEGATIVE 06/03/2022 0818   HGBUR TRACE-INTACT (A) 06/03/2022 0818   BILIRUBINUR NEGATIVE 06/03/2022 0818   BILIRUBINUR Negative 01/17/2019 1320   KETONESUR TRACE (A) 06/03/2022 0818   PROTEINUR Negative 01/17/2019 1320   UROBILINOGEN 0.2 06/03/2022 0818   NITRITE NEGATIVE 06/03/2022 0818   LEUKOCYTESUR NEGATIVE 06/03/2022 0818   Sepsis Labs Recent Labs  Lab 06/20/22 1403 06/21/22 0525 06/22/22 0433  WBC 6.6 7.1 8.6   Microbiology No results found for this or any previous visit (from the past 240 hour(s)).   Time coordinating discharge: Over 30 minutes  SIGNED:   Wyvonnia Dusky, MD  [facility] 06/22/2022, 3:09 PM Pager   If 7PM-7AM, please contact night-coverage [email or web address]

## 2022-06-22 NOTE — Evaluation (Signed)
Occupational Therapy Evaluation Patient Details Name: Mark Holmes MRN: [id number] DOB: [birth year] Today's Date: 06/22/2022   History of Present Illness Pt is an 87 y/o M who presented on 06/20/22 with c/o sudden onset of double vision & impaired balance. MRI revealed a punctate acute/subacute nonhemorrhagic infarct in the posterior left lower midbrain near the 4th cranial nerve nucleus with remote lacunar infarcts of the right thalamus and right cerebellum. PMH: HLD, benign prostatic hyperplasia, seizure disorder, HTN, cervical radiculopathy (s/p ACDF C3-4), essential tremor, peripheral neuropathy   Clinical Impression   Patient presenting with decreased Ind in self care,balance, functional mobility/transfers, endurance, and safety awareness. Patient reports living at home with wife and being independent with use of SPC for functional mobility. He performs all ADLs independently. Pt is very active at baseline.  Patient currently functioning at supervision - min guard without use of AD for ambulation within the room to bathroom and for toilet transfer and grooming while standing at sink. Pt reports feeling much better and wanting to resume outpt PT at discharge. No follow up OT services needed. Patient will benefit from acute OT to increase overall independence in the areas of ADLs, functional mobility, and safety awareness in order to safely discharge home with family.      Recommendations for follow up therapy are one component of a multi-disciplinary discharge planning process, led by the attending physician.  Recommendations may be updated based on patient status, additional functional criteria and insurance authorization.   Follow Up Recommendations  No OT follow up    Assistance Recommended at Discharge Intermittent Supervision/Assistance  Patient can return home with the following A little help with walking and/or transfers;A little help with bathing/dressing/bathroom;Assist for  transportation;Help with stairs or ramp for entrance    Functional Status Assessment  Patient has had a recent decline in their functional status and demonstrates the ability to make significant improvements in function in a reasonable and predictable amount of time.  Equipment Recommendations  None recommended by OT       Precautions / Restrictions Precautions Precautions: Fall      Mobility Bed Mobility Overal bed mobility: Modified Independent Bed Mobility: Supine to Sit, Sit to Supine     Supine to sit: Modified independent (Device/Increase time), HOB elevated Sit to supine: Modified independent (Device/Increase time), HOB elevated        Transfers Overall transfer level: Needs assistance Equipment used: None Transfers: Sit to/from Stand, Bed to chair/wheelchair/BSC Sit to Stand: Supervision     Step pivot transfers: Supervision, Min guard            Balance Overall balance assessment: Needs assistance Sitting-balance support: Feet supported, Bilateral upper extremity supported Sitting balance-Leahy Scale: Good     Standing balance support: During functional activity, No upper extremity supported Standing balance-Leahy Scale: Fair                             ADL either performed or assessed with clinical judgement   ADL Overall ADL's : Needs assistance/impaired                                       General ADL Comments: supervision - min guard without use of AD for functional transfers and mobility. Supervision for standing at sink for grooming tasks. Pt reports no longer having diplopia in any plane of vision during  assessment.     Vision Patient Visual Report: No change from baseline Additional Comments: Pt reports diplopia has cleared            Pertinent Vitals/Pain Pain Assessment Pain Assessment: No/denies pain     Hand Dominance Right   Extremity/Trunk Assessment Upper Extremity Assessment Upper Extremity  Assessment: Overall WFL for tasks assessed   Lower Extremity Assessment Lower Extremity Assessment: Defer to PT evaluation       Communication Communication Communication: HOH   Cognition Arousal/Alertness: Awake/alert Behavior During Therapy: WFL for tasks assessed/performed Overall Cognitive Status: Within Functional Limits for tasks assessed                                                  Home Living Family/patient expects to be discharged to:: Private residence Living Arrangements: Spouse/significant other Available Help at Discharge: Family;Available 24 hours/day Type of Home: House Home Access: Stairs to enter [employer] of Steps: 4-5 Entrance Stairs-Rails: Right;Left;Can reach both Home Layout: Two level;Able to live on main level with bedroom/bathroom     Bathroom Shower/Tub: Tub/shower unit         Home Equipment: Conservation officer, nature (2 wheels);Cane - single point;Grab bars - tub/shower          Prior Functioning/Environment Prior Level of Function : Independent/Modified Independent;Driving             Mobility Comments: Ambulatory with SPC but uses that primarily for support when standing for long periods of time, denies falls, still driving. Wears R ankle aircast (has for years 2/2 falling out of a tree years ago), going to OPPT on [employer] to focus on balance & strength. Pt endorses BLE/BUE peripheral neuropathy. ADLs Comments: Pt reports Ind in self care and IADLs.        OT Problem List: Decreased strength;Decreased activity tolerance;Decreased safety awareness;Impaired balance (sitting and/or standing)      OT Treatment/Interventions: Self-care/ADL training;Balance training;Therapeutic exercise;Therapeutic activities;DME and/or AE instruction;Patient/family education;Manual therapy    OT Goals(Current goals can be found in the care plan section) Acute Rehab OT Goals Patient Stated Goal: to go home OT Goal  Formulation: With patient/family Time For Goal Achievement: 07/06/22 Potential to Achieve Goals: Good ADL Goals Pt Will Perform Grooming: with modified independence;standing Pt Will Perform Lower Body Dressing: with modified independence;sit to/from stand Pt Will Transfer to Toilet: with modified independence;ambulating Pt Will Perform Toileting - Clothing Manipulation and hygiene: with modified independence;sit to/from stand  OT Frequency: Min 2X/week       AM-PAC OT "6 Clicks" Daily Activity     Outcome Measure Help from another person eating meals?: None Help from another person taking care of personal grooming?: None Help from another person toileting, which includes using toliet, bedpan, or urinal?: A Little Help from another person bathing (including washing, rinsing, drying)?: A Little Help from another person to put on and taking off regular upper body clothing?: None Help from another person to put on and taking off regular lower body clothing?: A Little 6 Click Score: 21   End of Session Nurse Communication: Mobility status  Activity Tolerance: Patient tolerated treatment well Patient left: in bed;with call bell/phone within reach;with bed alarm set;with family/visitor present  OT Visit Diagnosis: Unsteadiness on feet (R26.81)                Time: 1610-9604  OT Time Calculation (min): 15 min Charges:  OT General Charges $OT Visit: 1 Visit OT Evaluation $OT Eval Low Complexity: 1 Low  Darleen Crocker, MS, OTR/L , CBIS ascom [phone]  06/22/22, 12:42 PM

## 2022-06-22 NOTE — Telephone Encounter (Signed)
-----   Message from Rise Mu, PA-C sent at 06/22/2022  2:43 PM EDT ----- Please mail out a Zio AT under Dr. Fletcher Anon. Dx: CVA. They will need follow up with MD thereafter.

## 2022-06-24 ENCOUNTER — Other Ambulatory Visit: Payer: Self-pay | Admitting: Family Medicine

## 2022-06-24 ENCOUNTER — Telehealth: Payer: Self-pay | Admitting: *Deleted

## 2022-06-24 ENCOUNTER — Telehealth: Payer: Self-pay

## 2022-06-24 DIAGNOSIS — R809 Proteinuria, unspecified: Secondary | ICD-10-CM

## 2022-06-24 LAB — PHENYTOIN LEVEL, FREE AND TOTAL
Phenytoin, Free: 0.8 ug/mL — ABNORMAL LOW (ref 1.0–2.0)
Phenytoin, Total: 6.2 ug/mL — ABNORMAL LOW (ref 10.0–20.0)

## 2022-06-24 NOTE — Telephone Encounter (Signed)
Transition Care Management Follow-up Telephone Call Date of discharge and from where: 06/22/22 [place] How have you been since you were released from the hospital? [place] Any questions or concerns? No  Items Reviewed: Did the pt receive and understand the discharge instructions provided? Yes  Medications obtained and verified? Yes  Other? No  Any new allergies since your discharge? No  Dietary orders reviewed? Yes Do you have support at home? Yes   Home Care and Equipment/Supplies: Were home health services ordered? No,  OP THERAPY If so, what is the name of the agency? NA  Has the agency set up a time to come to the patient's home? not applicable Were any new equipment or medical supplies ordered?  No What is the name of the medical supply agency? [facility] Were you able to get the supplies/equipment? not applicable Do you have any questions related to the use of the equipment or supplies? No  Functional Questionnaire: (I = Independent and D = Dependent) ADLs: I  Bathing/Dressing- I  Meal Prep- I  Eating- I  Maintaining continence- I  Transferring/Ambulation- I  Managing Meds- I  Follow up appointments reviewed:  PCP Hospital f/u appt confirmed? No  , RNCM ASSISTED WITH CALLING OFFICE TO SCHEDULE, WAS TOLD NEED TO DISCUSS WITH PCP AS NO APPOINTMENT AVAILABLE IN 7-10 DAYS.  SCHEDULER TO SPEAK WITH PCP AND THEN CALL PATIENT BACK WITH [facility] f/u appt confirmed? Yes  Scheduled to see DR. [place] on 7/31 @ WOULD NOT DISCUSS TIME. Are transportation arrangements needed? No  If their condition worsens, is the pt aware to call PCP or go to the Emergency Dept.? Yes Was the patient provided with contact information for the PCP's office or ED? Yes Was to pt encouraged to call back with questions or concerns? Yes   Hubert Azure RN, MSN RN Care Management Coordinator  [phone] [email or web address]

## 2022-06-24 NOTE — Telephone Encounter (Signed)
Patient was discharged from the hospital and is needing to be seen in the next 7 days, no available appointments until the 17th. Please advise.

## 2022-06-25 ENCOUNTER — Telehealth: Payer: Self-pay

## 2022-06-25 NOTE — Telephone Encounter (Signed)
-----   Message from Peggyann Shoals sent at 06/25/2022  8:30 AM EDT ----- Regarding: ER fu Contact: [phone] Patient seen in the ER due to a stroke but the scan showed a "bubble" and they told him to follow-up with Dr.Yarbrough. When should he be seen?

## 2022-06-25 NOTE — Telephone Encounter (Signed)
Spoke with pt scheduling HFU on 06/26/22 at 12:00.

## 2022-06-25 NOTE — Telephone Encounter (Signed)
Patient is aware that vascular neurosurgery from [facility] will call him for an appt.

## 2022-06-25 NOTE — Telephone Encounter (Signed)
He needs to see vascular neurosurgery. Dr Izora Ribas sent a message to Claudie Fisherman  - one of the PA's at [facility].

## 2022-06-26 DIAGNOSIS — I6389 Other cerebral infarction: Secondary | ICD-10-CM

## 2022-06-26 NOTE — Telephone Encounter (Signed)
Late entry - spoke with patient, referral placed to nephrology.

## 2022-06-29 ENCOUNTER — Ambulatory Visit: Payer: Medicare Other | Admitting: Family Medicine

## 2022-06-29 ENCOUNTER — Telehealth: Payer: Self-pay | Admitting: Family Medicine

## 2022-06-29 ENCOUNTER — Encounter: Payer: Self-pay | Admitting: Family Medicine

## 2022-06-29 VITALS — BP 134/72 | HR 76 | Temp 97.7°F | Ht 67.0 in | Wt 176.1 lb

## 2022-06-29 DIAGNOSIS — Z8673 Personal history of transient ischemic attack (TIA), and cerebral infarction without residual deficits: Secondary | ICD-10-CM

## 2022-06-29 DIAGNOSIS — I671 Cerebral aneurysm, nonruptured: Secondary | ICD-10-CM

## 2022-06-29 DIAGNOSIS — H532 Diplopia: Secondary | ICD-10-CM | POA: Diagnosis not present

## 2022-06-29 DIAGNOSIS — R809 Proteinuria, unspecified: Secondary | ICD-10-CM

## 2022-06-29 DIAGNOSIS — E778 Other disorders of glycoprotein metabolism: Secondary | ICD-10-CM

## 2022-06-29 DIAGNOSIS — H9193 Unspecified hearing loss, bilateral: Secondary | ICD-10-CM | POA: Diagnosis not present

## 2022-06-29 DIAGNOSIS — E782 Mixed hyperlipidemia: Secondary | ICD-10-CM

## 2022-06-29 DIAGNOSIS — I639 Cerebral infarction, unspecified: Secondary | ICD-10-CM

## 2022-06-29 DIAGNOSIS — G620 Drug-induced polyneuropathy: Secondary | ICD-10-CM

## 2022-06-29 DIAGNOSIS — G40909 Epilepsy, unspecified, not intractable, without status epilepticus: Secondary | ICD-10-CM

## 2022-06-29 NOTE — Patient Instructions (Addendum)
Refer to ENT for abnormal R ear drum and left ear wax. You may call to schedule an appointment at [facility] [phone] Continue current medicines.  Keep upcoming appointments with neurology and cardiology.  Call to ask about appointment with kidney doctor.

## 2022-06-29 NOTE — Progress Notes (Unsigned)
Patient ID: Mark Holmes, male    DOB: 01/22/1935, 87 y.o.   MRN: [id number]  This visit was conducted in person.  BP 134/72   Pulse 76   Temp 97.7 F (36.5 C) (Temporal)   Ht '5\' 7"'$  (1.702 m)   Wt 176 lb 2 oz (79.9 kg)   SpO2 97%   BMI 27.59 kg/m    CC: hosp f/u visit  Subjective:   HPI: Mark Holmes is a 87 y.o. male presenting on 06/29/2022 for Hospitalization Follow-up (Admitted on 06/20/22 at [facility], dx CVA.)   Recent hospitalization for acute/subacute punctate nonhemorrhagic stroke of posterior L lower midbrain near 4th CN nucleus presenting as sudden onset double vision and unsteadiness. MRI also showed remote lacunar infarcts of R thalamus and R cerebellum. 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  Dx intermittent ophthalmoplegia of L eye.  Hospital records reviewed. Med rec performed.  Started on DAPT with plavix and aspirin x 21 days then planned lifelong aspirin. Atorvastatin also increased to '40mg'$  daily.  Discharged to have Zio patch mailed to his house.   Home health not set up. PT recommended outpatient PT. He already continues this. Will place new referral post stroke for balance, strength.   Other follow up appointments scheduled: 07/20/2022 - neurology. Cardiology [place] 07/21/2022.    Prior to hospitalization workup revealed nephrotic range proteinuria - pending nephrology referral.   Needs to f/u with neurology to discuss change in AED phenytoin given new nephrotic range proteinuria.   Saw neurosurgery - who recommended conservative management with serial CTAs. Planned f/u in 6 months.   Would like ears checked - feels muffled hearing and ear pressure change for months, R ear draining for months. No ear pain.   ______________________________________________________________________ Hospital admission: 06/20/2022 Hospital discharge: 06/22/2022 TCM f/u phone call: performed 06/24/2022  Admitted From:  home Disposition:  home    Recommendations for Outpatient Follow-up:  Follow up with PCP in 1-2 weeks F/u w/ neuro, Dr. SManuella Ghazi in 1 week F/u w/ neuro surg., Dr. YIzora Ribas in 1-2 weeks F/u w/ nephro to f/u 24 hr urine protein test results    Discharge Diagnoses:  Principal Problem:   Acute stroke due to ischemia ([facility] Active Problems:   Essential hypertension   Nephrotic range proteinuria   Mixed hyperlipidemia   [place]izure disorder ([place]   BPH (benign prostatic hyperplasia)   Peripheral neuropathy  Home Health: no  Equipment/Devices:   Discharge Condition: stable  CODE STATUS: full  Diet recommendation: Heart Healthy     Relevant past medical, surgical, family and social history reviewed and updated as indicated. Interim medical history since our last visit reviewed. Allergies and medications reviewed and updated. Outpatient Medications Prior to Visit  Medication Sig Dispense Refill   Alpha-Lipoic Acid 600 MG TABS Take 1 tablet by mouth daily.     aspirin EC 81 MG tablet Take 1 tablet (81 mg total) by mouth daily. Swallow whole. 30 tablet 12   aspirin-acetaminophen-caffeine (EXCEDRIN MIGRAINE) 2678-938-10MG per tablet Take 1 tablet by mouth 2 (two) times daily as needed.      atorvastatin (LIPITOR) 40 MG tablet Take 1 tablet (40 mg total) by mouth at bedtime. 30 tablet 0   b complex vitamins capsule Take 1 capsule by mouth daily. [facility] 3 in 1     clopidogrel (PLAVIX) 75 MG tablet Take 1 tablet (75 mg total) by mouth daily for 19 days. 1[place]  tablet 0   desipramine (NORPRAMIN) 25 MG tablet Take 1 tablet (25 mg total) by mouth at bedtime. 30 tablet 6   finasteride (PROSCAR) 5 MG tablet Take 1 tablet (5 mg total) by mouth daily. 90 tablet 3   gabapentin (NEURONTIN) 300 MG capsule Take 1 capsule (300 mg total) by mouth 2 (two) times daily. 180 capsule 3   phenytoin (DILANTIN) 100 MG ER capsule Take 2 capsules (200 mg total) by mouth 2 (two) times daily. 360  capsule 1   ramipril (ALTACE) 10 MG capsule Take 1 capsule (10 mg total) by mouth daily. 90 capsule 3   terazosin (HYTRIN) 10 MG capsule Take 1 capsule (10 mg total) by mouth daily. 90 capsule 3   No facility-administered medications prior to visit.     Per HPI unless specifically indicated in ROS section below Review of Systems  Objective:  BP 134/72   Pulse 76   Temp 97.7 F (36.5 C) (Temporal)   Ht '5\' 7"'$  (1.702 m)   Wt 176 lb 2 oz (79.9 kg)   SpO2 97%   BMI 27.59 kg/m   Wt Readings from Last 3 Encounters:  06/29/22 176 lb 2 oz (79.9 kg)  06/20/22 177 lb (80.3 kg)  05/19/22 176 lb 8 oz (80.1 kg)      Physical Exam Vitals and nursing note reviewed.  Constitutional:      Appearance: Normal appearance. He is not ill-appearing.  HENT:     Head: Normocephalic and atraumatic.     Right Ear: Ear canal normal. Decreased hearing noted. Drainage present.     Left Ear: Ear canal normal. Decreased hearing noted. There is impacted cerumen.     Ears:     Comments:  Deep R external ear with yellow drainage covering TM L ear filled with cerumen, unable to visualize TM    Mouth/Throat:     Mouth: Mucous membranes are moist.     Pharynx: Oropharynx is clear. No oropharyngeal exudate or posterior oropharyngeal erythema.  Eyes:     Extraocular Movements: Extraocular movements intact.     Pupils: Pupils are equal, round, and reactive to light.  Cardiovascular:     Rate and Rhythm: Normal rate and regular rhythm.     Pulses: Normal pulses.     Heart sounds: Normal heart sounds. No murmur heard. Pulmonary:     Effort: Pulmonary effort is normal. No respiratory distress.     Breath sounds: Normal breath sounds. No wheezing, rhonchi or rales.  Musculoskeletal:     Right lower leg: Edema present.     Left lower leg: Edema present.  Skin:    General: Skin is warm and dry.     Findings: No rash.  Neurological:     Mental Status: He is alert.  Psychiatric:        Mood and Affect:  Mood normal.        Behavior: Behavior normal.       Results for orders placed or performed during the hospital encounter of 06/20/22  Protime-INR  Result Value Ref Range   Prothrombin Time 13.8 11.4 - 15.2 seconds   INR 1.1 0.8 - 1.2  APTT  Result Value Ref Range   aPTT 29 24 - 36 seconds  CBC  Result Value Ref Range   WBC 6.6 4.0 - 10.5 K/uL   RBC 3.71 (L) 4.22 - 5.81 MIL/uL   Hemoglobin 10.8 (L) 13.0 - 17.0 g/dL   HCT 32.7 (L) 39.0 - 52.0 %  MCV 88.1 80.0 - 100.0 fL   MCH 29.1 26.0 - 34.0 pg   MCHC 33.0 30.0 - 36.0 g/dL   RDW 13.4 11.5 - 15.5 %   Platelets 257 150 - 400 K/uL   nRBC 0.0 0.0 - 0.2 %  Differential  Result Value Ref Range   Neutrophils Relative % 68 %   Neutro Abs 4.5 1.7 - 7.7 K/uL   Lymphocytes Relative 20 %   Lymphs Abs 1.4 0.7 - 4.0 K/uL   Monocytes Relative 8 %   Monocytes Absolute 0.5 0.1 - 1.0 K/uL   Eosinophils Relative 3 %   Eosinophils Absolute 0.2 0.0 - 0.5 K/uL   Basophils Relative 1 %   Basophils Absolute 0.1 0.0 - 0.1 K/uL   Immature Granulocytes 0 %   Abs Immature Granulocytes 0.01 0.00 - 0.07 K/uL  Comprehensive metabolic panel  Result Value Ref Range   Sodium 137 135 - 145 mmol/L   Potassium 3.9 3.5 - 5.1 mmol/L   Chloride 109 98 - 111 mmol/L   CO2 26 22 - 32 mmol/L   Glucose, Bld 143 (H) 70 - 99 mg/dL   BUN 25 (H) 8 - 23 mg/dL   Creatinine, Ser 1.09 0.61 - 1.24 mg/dL   Calcium 7.9 (L) 8.9 - 10.3 mg/dL   Total Protein 5.2 (L) 6.5 - 8.1 g/dL   Albumin 2.5 (L) 3.5 - 5.0 g/dL   AST 21 15 - 41 U/L   ALT 17 0 - 44 U/L   Alkaline Phosphatase 122 38 - 126 U/L   Total Bilirubin 0.5 0.3 - 1.2 mg/dL   GFR, Estimated >60 >60 mL/min   Anion gap 2 (L) 5 - 15  Ethanol  Result Value Ref Range   Alcohol, Ethyl (B) <10 <10 mg/dL  Phenytoin level, total  Result Value Ref Range   Phenytoin Lvl 8.2 (L) 10.0 - 20.0 ug/mL  Lipid panel  Result Value Ref Range   Cholesterol 163 0 - 200 mg/dL   Triglycerides 114 <150 mg/dL   HDL 48 >40 mg/dL    Total CHOL/HDL Ratio 3.4 RATIO   VLDL 23 0 - 40 mg/dL   LDL Cholesterol 92 0 - 99 mg/dL  Hemoglobin A1c  Result Value Ref Range   Hgb A1c MFr Bld 5.4 4.8 - 5.6 %   Mean Plasma Glucose 108 mg/dL  Comprehensive metabolic panel  Result Value Ref Range   Sodium 139 135 - 145 mmol/L   Potassium 3.9 3.5 - 5.1 mmol/L   Chloride 110 98 - 111 mmol/L   CO2 26 22 - 32 mmol/L   Glucose, Bld 87 70 - 99 mg/dL   BUN 21 8 - 23 mg/dL   Creatinine, Ser 0.99 0.61 - 1.24 mg/dL   Calcium 8.0 (L) 8.9 - 10.3 mg/dL   Total Protein 5.0 (L) 6.5 - 8.1 g/dL   Albumin 2.4 (L) 3.5 - 5.0 g/dL   AST 21 15 - 41 U/L   ALT 17 0 - 44 U/L   Alkaline Phosphatase 125 38 - 126 U/L   Total Bilirubin 0.4 0.3 - 1.2 mg/dL   GFR, Estimated >60 >60 mL/min   Anion gap 3 (L) 5 - 15  Magnesium  Result Value Ref Range   Magnesium 2.3 1.7 - 2.4 mg/dL  CBC with Differential/Platelet  Result Value Ref Range   WBC 7.1 4.0 - 10.5 K/uL   RBC 3.71 (L) 4.22 - 5.81 MIL/uL   Hemoglobin 10.9 (L) 13.0 - 17.0 g/dL  HCT 32.7 (L) 39.0 - 52.0 %   MCV 88.1 80.0 - 100.0 fL   MCH 29.4 26.0 - 34.0 pg   MCHC 33.3 30.0 - 36.0 g/dL   RDW 13.4 11.5 - 15.5 %   Platelets 250 150 - 400 K/uL   nRBC 0.0 0.0 - 0.2 %   Neutrophils Relative % 65 %   Neutro Abs 4.5 1.7 - 7.7 K/uL   Lymphocytes Relative 21 %   Lymphs Abs 1.5 0.7 - 4.0 K/uL   Monocytes Relative 9 %   Monocytes Absolute 0.6 0.1 - 1.0 K/uL   Eosinophils Relative 4 %   Eosinophils Absolute 0.3 0.0 - 0.5 K/uL   Basophils Relative 1 %   Basophils Absolute 0.1 0.0 - 0.1 K/uL   Immature Granulocytes 0 %   Abs Immature Granulocytes 0.03 0.00 - 0.07 K/uL  Phenytoin level, free and total  Result Value Ref Range   Phenytoin, Free 0.8 (L) 1.0 - 2.0 ug/mL   Phenytoin, Total 6.2 (L) 10.0 - 20.0 ug/mL  CBC  Result Value Ref Range   WBC 8.6 4.0 - 10.5 K/uL   RBC 3.83 (L) 4.22 - 5.81 MIL/uL   Hemoglobin 11.2 (L) 13.0 - 17.0 g/dL   HCT 33.4 (L) 39.0 - 52.0 %   MCV 87.2 80.0 - 100.0 fL    MCH 29.2 26.0 - 34.0 pg   MCHC 33.5 30.0 - 36.0 g/dL   RDW 13.2 11.5 - 15.5 %   Platelets 257 150 - 400 K/uL   nRBC 0.0 0.0 - 0.2 %  Basic metabolic panel  Result Value Ref Range   Sodium 142 135 - 145 mmol/L   Potassium 4.0 3.5 - 5.1 mmol/L   Chloride 110 98 - 111 mmol/L   CO2 28 22 - 32 mmol/L   Glucose, Bld 89 70 - 99 mg/dL   BUN 20 8 - 23 mg/dL   Creatinine, Ser 1.13 0.61 - 1.24 mg/dL   Calcium 8.1 (L) 8.9 - 10.3 mg/dL   GFR, Estimated >60 >60 mL/min   Anion gap 4 (L) 5 - 15  ECHOCARDIOGRAM COMPLETE BUBBLE STUDY  Result Value Ref Range   Ao pk vel 1.75 m/s   AV Area VTI 1.31 cm2   AR max vel 1.34 cm2   AV Mean grad 6.0 mmHg   AV Peak grad 12.3 mmHg   S' Lateral 2.12 cm   AV Area mean vel 1.38 cm2   Area-P 1/2 2.79 cm2    Assessment & Plan:   Problem List Items Addressed This Visit   None    No orders of the defined types were placed in this encounter.  No orders of the defined types were placed in this encounter.    ***Follow up plan: No follow-ups on file.  Ria Bush, MD

## 2022-06-29 NOTE — Telephone Encounter (Signed)
Pt called about some paperwork that was supposed to be sent over to [facility], it was for a work notice. Pt said he spoke with someone last week about this but never received a call or f/u back. Please advise thank you.  [facility]: [phone] Callback Number: [phone]

## 2022-06-29 NOTE — Telephone Encounter (Signed)
Spoke with pt to get clarification of message.  States he spoke with [facility] and they said they haven't received a STAT referral for pt.

## 2022-06-30 ENCOUNTER — Ambulatory Visit: Payer: Medicare Other | Attending: Family Medicine | Admitting: Physical Therapy

## 2022-06-30 VITALS — BP 151/80 | HR 76

## 2022-06-30 DIAGNOSIS — R2681 Unsteadiness on feet: Secondary | ICD-10-CM | POA: Insufficient documentation

## 2022-06-30 DIAGNOSIS — H9193 Unspecified hearing loss, bilateral: Secondary | ICD-10-CM | POA: Insufficient documentation

## 2022-06-30 DIAGNOSIS — I671 Cerebral aneurysm, nonruptured: Secondary | ICD-10-CM | POA: Insufficient documentation

## 2022-06-30 DIAGNOSIS — R208 Other disturbances of skin sensation: Secondary | ICD-10-CM | POA: Insufficient documentation

## 2022-06-30 DIAGNOSIS — R262 Difficulty in walking, not elsewhere classified: Secondary | ICD-10-CM | POA: Insufficient documentation

## 2022-06-30 NOTE — Assessment & Plan Note (Signed)
Now on atorvastatin '40mg'$  daily.  The ASCVD Risk score (Arnett DK, et al., 2019) failed to calculate for the following reasons:   The 2019 ASCVD risk score is only valid for ages 74 to 45   The patient has a prior MI or stroke diagnosis

## 2022-06-30 NOTE — Assessment & Plan Note (Signed)
Incidentally found 54m PCOM aneurysm, saw neurosurgery who recommended Q663moonitoring.

## 2022-06-30 NOTE — Assessment & Plan Note (Signed)
New diagnosis, just recent associated hypoproteinemia.  Cr reassuring.  Pending nephrology appointment.

## 2022-06-30 NOTE — Assessment & Plan Note (Signed)
Notes months of difficulty hearing. Ear exam abnormal - L ear with cerumen impaction, and R canal seems to have chronic drainage present - will refer to ENT for further evaluation/management.

## 2022-06-30 NOTE — Assessment & Plan Note (Addendum)
Presented with sudden onset double vision and unsteadiness, now largely resolved. Plan is aspirin + plavix x3 wks then transition to plain aspirin. Lipitor dose was also increased to '40mg'$  daily. He has neurology f/u scheduled for end of this month.  Currently has Zio patch in place, pending cardiology f/u as well.

## 2022-06-30 NOTE — Assessment & Plan Note (Addendum)
Phenytoin likely inadequate in setting of recent nephrotic range proteinuria. This proteinuria is a recent development. He will f/u with neurology to discuss AED change.

## 2022-06-30 NOTE — Therapy (Signed)
OUTPATIENT PHYSICAL THERAPY Re-EVALUATION   Patient Name: Mark Holmes MRN: [id number] DOB:1935-07-29, 87 y.o., male Today's Date: 06/30/2022     Past Medical History:  Diagnosis Date   Benign prostatic hypertrophy with elevated PSA    per Dr. Jacqlyn Larsen   History of CT scan of brain 1992   normal   Hyperglycemia 01/2006   102   Hyperlipemia    Hypertension    Seizure disorder [facility])    Past Surgical History:  Procedure Laterality Date   ANTERIOR CERVICAL DECOMP/DISCECTOMY FUSION  05/2018   for myelopathy -C3/4 ([facility])   CARPAL TUNNEL RELEASE Right 06/2019   ([facility])   CATARACT EXTRACTION W/ INTRAOCULAR LENS IMPLANT Right 2012   [facility]   CATARACT EXTRACTION W/PHACO Left 01/14/2022   Procedure: CATARACT EXTRACTION PHACO AND INTRAOCULAR LENS PLACEMENT ([facility]) LEFT;  Surgeon: Leandrew Koyanagi, MD;  Location: [facility];  Service: Ophthalmology;  Laterality: Left;  7.55 01:03.6   History of EEG  1985   normal   hospitalization  1981   observation for seizuare   TONSILLECTOMY     TRANSURETHRAL RESECTION OF PROSTATE  10/2005   [facility]   TRANSURETHRAL RESECTION OF PROSTATE  08/2017   (rpt) [facility]   Patient Active Problem List   Diagnosis Date Noted   Bilateral hearing loss 06/30/2022   Brain aneurysm 06/30/2022   Acute stroke due to ischemia ([facility]) 06/20/2022   Nephrotic range proteinuria 06/04/2022   Hypoproteinemia ([facility]) 06/01/2022   Atypical chest pain 07/10/2020   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   Unsteadiness on feet 11/08/2019   History of spinal surgery 01/17/2019   Neck pain 01/17/2019   Weakness of hand 01/17/2019   BPH (benign prostatic hyperplasia) 60/60/0459   Systolic murmur 97/74/1423   Right carpal tunnel syndrome 02/02/2018   Ulnar neuropathy at elbow, left 02/02/2018   Peripheral neuropathy 01/26/2018   Bilateral buttock pain 06/29/2016   Failed vision screen 06/29/2016   Advanced care planning/counseling discussion  06/21/2015   Medicare annual wellness visit, subsequent 06/18/2014   Health maintenance examination 06/18/2014   Chronic ankle pain 06/18/2014   Incomplete emptying of bladder 06/07/2013   Vitamin D deficiency 05/20/2013   THYROID NODULE 05/17/2007   Mixed hyperlipidemia 05/17/2007   Essential tremor 05/17/2007   Essential hypertension 05/17/2007   Cervical spondylosis with myelopathy and radiculopathy 05/17/2007   Seizure disorder ([facility]) 05/17/2007    PCP: Ria Bush, MD  REFERRING PROVIDER: Ria Bush, MD  REFERRING DIAG: other polyneuropathy, acute stroke due to ischemia, double vision  Rationale for Evaluation and Treatment: Rehabilitation  THERAPY DIAG:  No diagnosis found.  ONSET DATE: polyneuropathy chronic, CVA/Double vision 06/20/2022  SUBJECTIVE:  SUBJECTIVE STATEMENT: Patient is known to this clinic and PT and recently started back to PT for polyneuropathy before he had a stroke on 06/20/2022. MRI brain was performed which revealed a punctate acute/subacute nonhemorrhagic infarct in the posterior left lower midbrain near the 4th cranial nerve nucleus with remote lacunar infarcts of the right thalamus and right cerebellum. Patient was incidentally found to have 82m PCOM aneurysm on CTA head and neck for which neuro surg recommended conservative approach, re-checking in 6 months for changes. He has also had new onset proteinuria with referral to nephrology. He originally had double vision and dizziness during the stroke but his symptoms have appeared to resolve. Since the stroke is also having trouble with R ear. He states this was coming on before the stroke. His doctors also recommended changing his anti-seizure medication. He has not been driving since the stroke.   PERTINENT HISTORY:   Patient is a 87y.o. male who presents to outpatient physical therapy with a referral for medical diagnosis other polyneuropathy. This patient's chief complaints consist of altered sensation, feeling of tightness in bilateral LE below the knees, R ankle plain, low back pain leading to the following functional deficits: difficulty with balance, household and community mobility, ADLs, transfers, and usual mobility without falling, sleeping. 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    PAIN:  Are you having pain? Yes: NPRS scale: not provided Pain location: right ankle, bilateral lower extremity below knees, low back.  Pain description: low back in morning, R ankle when standing and walking on it,  B lower legs feel like something is squeezing them, or like they are tight like leather.  Aggravating factors: lower legs: prolonged sitting; R ankle: prolonged weight bearing and use; low back: mornings.  Relieving factors: legs and back: getting up and moving around, ankle: resting or not overusing it.   FUNCTIONAL LIMITATIONS: difficulty with balance, household and community mobility, ADLs, transfers, and usual mobility without falling, sleeping.   PRECAUTIONS: Fall  WEIGHT BEARING RESTRICTIONS No  FALLS:  Has patient fallen in last 6 months? No  LEISURE: gardening, taking the dog out.   PLOF: Independent  PATIENT GOALS: patient would  like his legs to stop hurting, improve his balance, and to run with the PT. Do the best we can with what we got.    OBJECTIVE  SELF-REPORTED FUNCTION FOTO score: ***/100 (*** questionnaire)  Vitals:   06/30/22 1058  BP: (!) 151/80  Pulse: 76  SpO2: 97%    DIAGNOSTIC FINDINGS:  NCS/EMG lower extremity 04/08/2021: "This is an abnormal electrodiagnostic study consistent with a generalized sensory predeominant polyneuropathy" per neurology notes.   During hospitalization on  MRI brain was performed which revealed a punctate acute/subacute nonhemorrhagic infarct in the posterior left lower midbrain near the 4th cranial nerve nucleus with remote lacunar infarcts of the right thalamus and right cerebellum.   Patient was incidentally found to have 492mPCOM aneurysm on CTA head and neck   OBSERVATION/INSPECTION Posture Posture (standing): bilateral genuvarum, L > R, weight shifted towards right  LE, wide stance.  Anthropometrics Tremor: none Body composition: WFL Edema: pitting edema bilateral ankles and lower legs, lateral > medial.  Functional Mobility Bed mobility: supine <> sit and rolling mod I due to increased time and effort. Groaning.  Transfers: sit <> stand I with B UE support.  Gait: ambulates with SPC in R UE, wide stance, unsteady at times.   SPINE MOTION LUMBAR SPINE AROM *Indicates pain Flexion: fingers on the floor, no pain  Extension: 50%, feels good Side Flexion:   R WNL  L 75% pain in left back Rotation:  R WNL L WNL  NEUROLOGICAL Sensation L2-S2 dermatomes appear equal and intact to light touch.    PERIPHERAL JOINT MOTION (in degrees) PASSIVE RANGE OF MOTION (PROM) Comments: B LE appears WFL except right ankle restriction in all directions compared to left, and noted for stiffness in B hip extension (~10 degrees available).   MUSCLE PERFORMANCE (MMT):  *Indicates pain 06/10/22 06/30/22 Date  Joint/Motion R/L R/L R/L  Hip     Flexion (L1, L2) 4+/5  4/4+ /  Extension (knee ext) 4+/4+ 4+/4 /  Extension (knee flex) / / /  Abduction 4+/4+ 4+/5 /  Knee     Extension (L3) 4+/4+ 5/5 /  Flexion (S2) 4/4 4-/4+ /  Ankle/Foot     Dorsiflexion (L4) 5/4 5/5 /  Great toe extension (L5) 4/4 5/4+ /  Eversion (S1) 5/5 5/4+ /  Comments:  06/10/2022: able to toe walk on the left and heel walk on both LE with UE support.  06/30/2022: able to toe walk on the left and heel walk on both LE with UE support, mild discomfort in low back with hip extension.   SPECIAL TESTS:  LOWER LIMB NEURODYNAMIC TESTS Straight Leg Raise (Sciatic nerve)  R  = positive for neural tension at back of thigh, not concordant.   L  = positive for pain at back of thigh and glute  HIP SPECIAL TESTS FABER: R = positive for back pain, L = positive for pain at lateral/posterior thigh    FUNCTIONAL/BALANCE TESTS:  Romberg test: -Narrow stance, eyes open: >30 seconds -Narrow stance, eyes closed: 11 seconds  -Tandem stance, eyes open: R front = 5 seconds, L front = <3 second.   TODAY'S TREATMENT  education   PATIENT EDUCATION:  Education details: education about cauda equina syndrome, radiculopathy, low back pain, neuropathy, nerve conduction results in chart, Education on diagnosis, prognosis, POC, anatomy and physiology of current condition.  Person educated: Patient Education method: Customer service manager Education comprehension: verbalized understanding and needs further education   HOME EXERCISE PROGRAM: TBD  ASSESSMENT:  CLINICAL IMPRESSION: Patient is a 87 y.o. male referred to outpatient physical therapy with a medical diagnosis of other polyneuropathy who presents with signs and symptoms consistent with imbalance, difficulty with motor control and ambulation consistent with peripheral neuropathy and possible contribution from history of cervical myelopathy. Patient is negative for concordant symptoms with lumbar AROM and lower extremity neurodynamic  testing, significantly decreasing likelihood that primary symptoms are from lumbar radiculopathy or sciatica. Patient presents with significant balance, sensory, motor control, postural control, pain, ROM, joint stiffness, muscle performance (strength/power/endurance), knowledge, and activity tolerance impairments that are limiting ability to complete his usual activities such as balance, household and community mobility, ADLs, transfers, and usual mobility without falling, sleeping without difficulty. Patient will benefit from skilled physical therapy intervention to address current body structure impairments and activity limitations to improve function and work towards goals set in current  POC in order to return to prior level of function or maximal functional improvement.    OBJECTIVE IMPAIRMENTS Abnormal gait, decreased activity tolerance, decreased balance, decreased coordination, decreased endurance, decreased knowledge of condition, decreased mobility, difficulty walking, decreased ROM, decreased strength, increased edema, impaired sensation, and pain.   ACTIVITY LIMITATIONS carrying, lifting, bending, sitting, standing, squatting, sleeping, stairs, transfers, bed mobility, dressing, and locomotion level  PARTICIPATION LIMITATIONS: shopping, community activity, yard work, and   Insurance underwriter, household and community mobility, ADLs, transfers, and usual mobility without falling, sleeping.  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   REHAB POTENTIAL: Fair patient previously exhausted improvements in PT and has continued doing HEP regularly since then. Underlying cause of issue (neuropathy) unlikely to be able to be reversed by PT  CLINICAL DECISION MAKING: Stable/uncomplicated  EVALUATION COMPLEXITY: Low   GOALS: Goals reviewed with patient? No  SHORT TERM GOALS: Target date: 07/14/2022  Patient will be independent with initial home exercise program for self-management of symptoms. Baseline: Initial HEP to be provided at visit 2 as appropriate (06/30/22); Goal status: INITIAL   LONG TERM GOALS: Target date: 09/22/2022  Patient will be independent with a long-term home exercise program for self-management of symptoms.  Baseline: Initial HEP to be provided at visit 2 as appropriate (06/30/22); Goal status: INITIAL  2.  Patient will demonstrate improved FOTO by equal or greater than 10 points to demonstrate improvement in overall condition and self-reported functional ability.  Baseline: to be measured at visit 2 as appropriate (06/30/22); Goal status: INITIAL  3.  Patient will score equal or greater than 25/30 on Functional Gait Assessment to demonstrate low fall risk.  Baseline: to be tested at visit 2 as appropriate (06/30/22); Goal status: INITIAL  4.  Patient will balance in tandem stance equal or greater than 15 seconds with each foot front to show improved static balance and decreased fall risk.  Baseline: R front = 5 seconds, L front = <1 second.  (06/30/22); Goal status: INITIAL  5.  Patient will complete community, work and/or recreational activities without limitation due to current condition.  Baseline: balance, household and community mobility, ADLs, transfers, and usual mobility without falling,  sleeping (06/30/22); Goal status: INITIAL    PLAN: PT FREQUENCY: 1-2x/week  PT DURATION: 12 weeks  PLANNED INTERVENTIONS: Therapeutic exercises, Therapeutic activity, Neuromuscular re-education, Balance training, Gait training, Patient/Family education, Joint mobilization, Stair training, DME instructions, Dry Needling, Electrical stimulation, Spinal mobilization, Cryotherapy, Moist heat, Manual therapy, and Re-evaluation.  PLAN FOR NEXT SESSION: FGA, FOTO, balance exercises, update HEP as appropriate.    Everlean Alstrom. Graylon Good, PT, DPT 06/30/22, 10:59 AM  [facility] [address] [place], [place] [address] P: [phone] I F: [phone]

## 2022-06-30 NOTE — Assessment & Plan Note (Signed)
Continues gabapentin.

## 2022-07-01 ENCOUNTER — Encounter: Payer: Self-pay | Admitting: Physical Therapy

## 2022-07-01 NOTE — Telephone Encounter (Signed)
Spoke with pt relaying Ashtyn's message.  Pt expresses his thanks.

## 2022-07-01 NOTE — Telephone Encounter (Signed)
Referral has been refaxed. See referral notes in Epic

## 2022-07-02 ENCOUNTER — Ambulatory Visit: Payer: Medicare Other | Admitting: Physical Therapy

## 2022-07-02 ENCOUNTER — Encounter: Payer: Self-pay | Admitting: Physical Therapy

## 2022-07-02 ENCOUNTER — Telehealth: Payer: Self-pay | Admitting: Physical Therapy

## 2022-07-02 VITALS — BP 152/68 | HR 83

## 2022-07-02 DIAGNOSIS — R2681 Unsteadiness on feet: Secondary | ICD-10-CM

## 2022-07-02 DIAGNOSIS — R262 Difficulty in walking, not elsewhere classified: Secondary | ICD-10-CM | POA: Diagnosis not present

## 2022-07-02 DIAGNOSIS — R208 Other disturbances of skin sensation: Secondary | ICD-10-CM

## 2022-07-02 NOTE — Telephone Encounter (Signed)
Called patient when he did not come to his PT appointment scheduled for 10:30am today. Patient answered and said he did not realize he had an appointment today. He checked his printed schedule and it matched his expectation of coming Tues 07/07/22 at [facility]. He was able to reschedule to 1:45pm today.   Everlean Alstrom. Graylon Good, PT, DPT 07/02/22, 10:58 AM  [facility] [address] [place], [place] [address] P: [phone] I F: [phone]

## 2022-07-02 NOTE — Therapy (Signed)
OUTPATIENT PHYSICAL THERAPY TREATMENT NOTE   Patient Name: Mark Holmes MRN: [id number] DOB:1935/01/15, 87 y.o., male Today's Date: 07/02/2022  PCP: Ria Bush, MD REFERRING PROVIDER: Ria Bush, MD  END OF SESSION:   PT End of Session - 07/02/22 1422     Visit Number 4    Number of Visits 24    Date for PT Re-Evaluation 09/02/22    Authorization Type [employer] MEDICARE reporting period from 06/10/2022    Progress Note Due on Visit 10    PT Start Time 1346    PT Stop Time 1425    PT Time Calculation (min) 39 min    Activity Tolerance Patient tolerated treatment well    Behavior During Therapy WFL for tasks assessed/performed             Past Medical History:  Diagnosis Date   Benign prostatic hypertrophy with elevated PSA    per Dr. Jacqlyn Larsen   History of CT scan of brain 1992   normal   Hyperglycemia 01/2006   102   Hyperlipemia    Hypertension    Seizure disorder [facility])    Past Surgical History:  Procedure Laterality Date   ANTERIOR CERVICAL DECOMP/DISCECTOMY FUSION  05/2018   for myelopathy -C3/4 ([place] [facility])   CARPAL TUNNEL RELEASE Right 06/2019   ([place])   CATARACT EXTRACTION W/ INTRAOCULAR LENS IMPLANT Right 2012   [facility]   CATARACT EXTRACTION W/PHACO Left 01/14/2022   Procedure: CATARACT EXTRACTION PHACO AND INTRAOCULAR LENS PLACEMENT ([place]) LEFT;  Surgeon: Leandrew Koyanagi, MD;  Location: [place];  Service: Ophthalmology;  Laterality: Left;  7.55 01:03.6   History of EEG  1985   normal   hospitalization  1981   observation for seizuare   TONSILLECTOMY     TRANSURETHRAL RESECTION OF PROSTATE  10/2005   [facility]   TRANSURETHRAL RESECTION OF PROSTATE  08/2017   (rpt) [facility]   Patient Active Problem List   Diagnosis Date Noted   Bilateral hearing loss 06/30/2022   Brain aneurysm 06/30/2022   Acute stroke due to ischemia ([place]) 06/20/2022   Nephrotic range proteinuria 06/04/2022   Hypoproteinemia ([place])  06/01/2022   Atypical chest pain 07/10/2020   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   Unsteadiness on feet 11/08/2019   History of spinal surgery 01/17/2019   Neck pain 01/17/2019   Weakness of hand 01/17/2019   BPH (benign prostatic hyperplasia) 18/29/9371   Systolic murmur 69/67/8938   Right carpal tunnel syndrome 02/02/2018   Ulnar neuropathy at elbow, left 02/02/2018   Peripheral neuropathy 01/26/2018   Bilateral buttock pain 06/29/2016   Failed vision screen 06/29/2016   Advanced care planning/counseling discussion 06/21/2015   Medicare annual wellness visit, subsequent 06/18/2014   Health maintenance examination 06/18/2014   Chronic ankle pain 06/18/2014   Incomplete emptying of bladder 06/07/2013   Vitamin D deficiency 05/20/2013   THYROID NODULE 05/17/2007   Mixed hyperlipidemia 05/17/2007   Essential tremor 05/17/2007   Essential hypertension 05/17/2007   Cervical spondylosis with myelopathy and radiculopathy 05/17/2007   Seizure disorder ([place]) 05/17/2007    REFERRING DIAG: other polyneuropathy, acute stroke due to ischemia, double vision  THERAPY DIAG:  Difficulty in walking, not elsewhere classified  Unsteadiness on feet  Other disturbances of skin sensation  Rationale for Evaluation and Treatment: Rehabilitation  ONSET DATE: polyneuropathy chronic, CVA/Double vision 06/20/2022  PERTINENT HISTORY: Patient is a 87 y.o. male who presents to outpatient physical therapy with a referral for medical diagnosis other polyneuropathy, acute stroke  due to ischemia, double vision. This patient's chief complaints consist of altered sensation, feeling of tightness in bilateral LE below the knees, R ankle pain, low back pain, and difficulty hearing out of the right ear leading to the following functional deficits: difficulty with balance, household and community mobility, ADLs, transfers, and usual mobility without falling, sleeping. 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    PRECAUTIONS: Fall  SUBJECTIVE: [facility] except he has some pain in his right ankle. He feels his ankle pain would be higher if he didn't use his cane for leverage.   PAIN:  Are you having pain? 6/10 right ankle   OBJECTIVE:  Vitals:   07/02/22 1349  BP: (!) 152/68  Pulse: 83  SpO2: 98%    [facility] PT Assessment - 07/02/22 0001       Functional Gait  Assessment   Gait Level Surface Walks 20 ft in less than 7 sec but greater than 5.5 sec, uses assistive device, slower speed, mild gait deviations, or deviates 6-10 in outside of the 12 in walkway width.   self-selected pace: 7.35 seconds. Fast pace: 5.22 seconds   Change in Gait Speed Able to change speed, demonstrates mild gait deviations, deviates 6-10 in outside of the 12 in walkway width, or no gait deviations, unable to achieve a major change in velocity, or uses a change in velocity, or uses an assistive device.    Gait with Horizontal Head Turns Performs head turns smoothly with slight change in gait velocity (eg, minor disruption  to smooth gait path), deviates 6-10 in outside 12 in walkway width, or uses an assistive device.    Gait with Vertical Head Turns Performs task with slight change in gait velocity (eg, minor disruption to smooth gait path), deviates 6 - 10 in outside 12 in walkway width or uses assistive device    Gait and Pivot Turn Turns slowly, requires verbal cueing, or requires several small steps to catch balance following turn and stop    Step Over Obstacle Is able to step over one shoe box (4.5 in total height) without changing gait speed. No evidence of imbalance.    Gait with Narrow Base of Support Ambulates less than 4 steps heel to toe or cannot perform without assistance.    Gait with Eyes Closed Cannot walk 20 ft without assistance, severe gait deviations or imbalance, deviates greater than 15 in outside 12 in walkway width or will not attempt task.   12 seconds   Ambulating Backwards Walks 20 ft, uses assistive device, slower speed, mild gait deviations, deviates 6-10 in outside 12 in walkway width.   14 seconds   Steps Alternating feet, must use rail.    Total Score 15  FGA comment: < 19 = high risk fall             TODAY'S TREATMENT  Therapeutic exercise: to centralize symptoms and improve ROM, strength, muscular endurance, and activity tolerance required for successful completion of functional activities.  -  Vitals measurement prior to exercise - NuStep level 6 using bilateral upper and lower extremities. Seat/handle setting 9/9. For improved extremity mobility, muscular endurance, and activity tolerance; and to induce the analgesic effect of aerobic exercise, stimulate improved joint nutrition, and prepare body structures and systems for following interventions. x 6  minutes. Average SPM = 72.  Neuromuscular Re-education: to improve, balance, postural strength, muscle activation patterns, and stabilization strength required for functional activities: - FGA assessment (see above).  -  static stance, narrow stance, compliant surface, touchdown UE support, 2x54mn - standing alternating foot taps on 8.5 inch surface with intermittent UE support, 3x10 each side. (R ankle with audible popping/grinding and gives out during SLS on that side occasionally).  - forwards/backwards step over 6 inch hurdle 1x10 with each foot leading, S UE support, SBA.  - flateral steps over 6 inch hurdle 1x10 with each direction, S UE support, SBA.  Pt required multimodal cuing for proper technique and to facilitate improved neuromuscular control, strength, range of motion, and functional ability resulting in improved performance and form.   PATIENT EDUCATION:  Education details: Exercise purpose/form. Self management techniques. Education on diagnosis, prognosis, POC, anatomy and physiology of current condition.    Person educated: Patient Education method: ECustomer service managerEducation comprehension: verbalized understanding and needs further education     HOME EXERCISE PROGRAM: Access Code: [email or web address]: [email or web address] Date: 07/01/2022 Prepared by: SRosita Kea  Exercises - Standing Tandem Balance with Counter Support  - 1 x daily - 7 x weekly - 2 sets - 5 reps - 15 hold - Heel Toe Raises with Counter Support  - 1 x daily - 7 x weekly - 2 sets - 12 reps - Lunge with Counter Support  - 1 x daily - 7 x weekly - 2 sets - 12 reps - Gastroc Stretch on Wall  - 1 x daily - 7 x weekly - 2 sets - 3 reps - 30 hold   ASSESSMENT:   CLINICAL IMPRESSION: Patient tolerated treatment well today with some increased R ankle soreness. Minimal change in FGA since having stroke. He had a lot of trouble with balance today related to his R ankle collapsing suddenly with weight bearing. Patient's ankle has audible cracking and grinding. Patient would benefit from continued management of limiting condition by skilled physical therapist to address remaining impairments and functional  limitations to work towards stated goals and return to PLOF or maximal functional independence.    Patient is a 87y.o. male referred to outpatient physical therapy with a medical diagnosis of other polyneuropathy, acute stroke due to ischemia, double vision who presents with signs and symptoms consistent with imbalance, difficulty with motor control and ambulation consistent with peripheral neuropathy and possible contribution from history of cervical myelopathy. Patient is negative for concordant symptoms with lumbar AROM and lower extremity neurodynamic testing, significantly decreasing likelihood that primary symptoms are from lumbar radiculopathy or sciatica. Patient presents with significant balance, sensory, motor control, postural control, pain, ROM, joint stiffness, muscle performance (strength/power/endurance), knowledge, and activity tolerance impairments that are limiting ability to complete his usual activities such as balance, household and community mobility, ADLs, transfers, and usual mobility without falling, sleeping without difficulty. Patient will benefit  from skilled physical therapy intervention to address current body structure impairments and activity limitations to improve function and work towards goals set in current POC in order to return to prior level of function or maximal functional improvement.      OBJECTIVE IMPAIRMENTS Abnormal gait, decreased activity tolerance, decreased balance, decreased coordination, decreased endurance, decreased knowledge of condition, decreased mobility, difficulty walking, decreased ROM, decreased strength, increased edema, impaired sensation, and pain.    ACTIVITY LIMITATIONS carrying, lifting, bending, sitting, standing, squatting, sleeping, stairs, transfers, bed mobility, dressing, and locomotion level   PARTICIPATION LIMITATIONS: shopping, community activity, yard work, and   Insurance underwriter, household and community mobility, ADLs, transfers, and  usual mobility without falling, sleeping.   PERSONAL FACTORS Age, Past/current experiences, Time since onset of injury/illness/exacerbation, and 3+ comorbidities:   acute CVA on 06/20/2022 (MRI showed punctate acute/subacute nonhemorrhagic infarct in the posterior left lower midbrain near the 4th cranial nerve nucleus with remote lacunar infarcts of the right thalamus and right cerebellum),  65m PCOM aneurysm (found incidentally on CTA 06/20/2022, currently undergoing conservative monitoring), concern over dilantin related neuropathy, systolic murmur, benign prostatic hyperplasia with urinary obstruction (followed by urology), chronic R ankle pain (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    REHAB POTENTIAL: Fair patient previously exhausted improvements in PT and has continued doing HEP regularly since then. Underlying cause of issue (neuropathy) unlikely to be able to be reversed by PT, remote lacunar infarcts on cerebellum found on recent brain MRI suggest chronic deficits in motor control behind ataxic symtptoms.    CLINICAL DECISION MAKING: Stable/uncomplicated   EVALUATION COMPLEXITY: Low     GOALS: Goals reviewed with patient? No   SHORT TERM GOALS: Target date: 06/24/2022   Patient will be independent with initial home exercise program for self-management of symptoms. Baseline: Initial HEP to be provided at visit 2 as appropriate (06/10/22); initial HEP provided at visit 2 (07/16/2022); Goal status: in-progress     LONG TERM GOALS: Target date: 09/02/2022   Patient will be independent with a  long-term home exercise program for self-management of symptoms.  Baseline: Initial HEP to be provided at visit 2 as appropriate (06/10/22); initial HEP provided at visit 2 (07/16/2022); Goal status: In-progress   2.  Patient will demonstrate improved FOTO by equal or greater than 10 points to demonstrate improvement in overall condition and self-reported functional ability.  Baseline: to be measured at visit 2 as appropriate (06/10/22); 51 at visit 2 (06/16/2022); 51 at visit 3 (06/30/2022);  Goal status: In-progress   3.  Patient will score equal or greater than 25/30 on Functional Gait Assessment to demonstrate low fall risk.  Baseline: to be tested at visit 2 as appropriate (06/10/22); 13/30 high fall risk (06/16/2022); 15/30 high fall risk (07/02/2022);  Goal status: In-progress   4.  Patient will balance in tandem stance equal or greater than 15 seconds with each foot front to show improved static balance and decreased fall risk.  Baseline: R front = 5 seconds, L front = <1 second (06/10/22); R front = 5 seconds, L front = <1 second (06/30/2022);  Goal status: In progress   5.  Patient will complete community,  work and/or recreational activities without limitation due to current condition.  Baseline: balance, household and community mobility, ADLs, transfers, and usual mobility without falling, sleeping (06/10/22); Goal status: In-progress       PLAN: PT FREQUENCY: 1-2x/week   PT DURATION: 12 weeks   PLANNED INTERVENTIONS: Therapeutic exercises, Therapeutic activity, Neuromuscular re-education, Balance training, Gait training, Patient/Family education, Joint mobilization, Stair training, DME instructions, Dry Needling, Electrical stimulation, Spinal mobilization, Cryotherapy, Moist heat, Manual therapy, and Re-evaluation.   PLAN FOR NEXT SESSION: FGA, balance exercises, update HEP as appropriate.     Nancy Nordmann, PT, DPT 07/02/2022, 2:31 PM   [facility] [address] [place], [place] [address] P: [phone] I F: [phone]

## 2022-07-06 ENCOUNTER — Encounter: Payer: Self-pay | Admitting: Family Medicine

## 2022-07-06 NOTE — Telephone Encounter (Signed)
Spoke with [place] at [facility] asking about rx.  Told it was ready for pt to pick up.

## 2022-07-07 ENCOUNTER — Encounter: Payer: Self-pay | Admitting: Physical Therapy

## 2022-07-07 ENCOUNTER — Ambulatory Visit: Payer: Medicare Other | Admitting: Physical Therapy

## 2022-07-07 VITALS — BP 118/63 | HR 78

## 2022-07-07 DIAGNOSIS — R262 Difficulty in walking, not elsewhere classified: Secondary | ICD-10-CM | POA: Diagnosis not present

## 2022-07-07 DIAGNOSIS — R2681 Unsteadiness on feet: Secondary | ICD-10-CM

## 2022-07-07 DIAGNOSIS — R208 Other disturbances of skin sensation: Secondary | ICD-10-CM

## 2022-07-07 NOTE — Telephone Encounter (Signed)
No openings with MD Gollan .  Can patient see APP?

## 2022-07-07 NOTE — Telephone Encounter (Signed)
Rise Mu, PA-C  Sent: Tue July 07, 2022  4:51 PM  To: Emily Filbert, RN  Cc: Valora Corporal, RN; Meriam Sprague B          Message  Yes, my apologies.  I thought he was unestablished, though we did consult on him in 2021.

## 2022-07-07 NOTE — Telephone Encounter (Signed)
Mark Holmes, can this patient follow up with an APP after the monitor results in ~ 5 weeks?

## 2022-07-07 NOTE — Therapy (Signed)
OUTPATIENT PHYSICAL THERAPY TREATMENT NOTE   Patient Name: Mark Holmes MRN: [id number] DOB:1935-12-10, 87 y.o., male Today's Date: 07/07/2022  PCP: Ria Bush, MD REFERRING PROVIDER: Ria Bush, MD  END OF SESSION:   PT End of Session - 07/07/22 0909     Visit Number 5    Number of Visits 24    Date for PT Re-Evaluation 09/02/22    Authorization Type [employer] MEDICARE reporting period from 06/10/2022    Progress Note Due on Visit 10    PT Start Time 0902    PT Stop Time 0940    PT Time Calculation (min) 38 min    Activity Tolerance Patient tolerated treatment well    Behavior During Therapy [facility] for tasks assessed/performed              Past Medical History:  Diagnosis Date   Benign prostatic hypertrophy with elevated PSA    per Dr. Jacqlyn Larsen   History of CT scan of brain 1992   normal   Hyperglycemia 01/2006   102   Hyperlipemia    Hypertension    Seizure disorder [facility])    Past Surgical History:  Procedure Laterality Date   ANTERIOR CERVICAL DECOMP/DISCECTOMY FUSION  05/2018   for myelopathy -C3/4 ([place] [facility])   CARPAL TUNNEL RELEASE Right 06/2019   ([place])   CATARACT EXTRACTION W/ INTRAOCULAR LENS IMPLANT Right 2012   [place] [facility]   CATARACT EXTRACTION W/PHACO Left 01/14/2022   Procedure: CATARACT EXTRACTION PHACO AND INTRAOCULAR LENS PLACEMENT ([place]) LEFT;  Surgeon: Leandrew Koyanagi, MD;  Location: [place];  Service: Ophthalmology;  Laterality: Left;  7.55 01:03.6   History of EEG  1985   normal   hospitalization  1981   observation for seizuare   TONSILLECTOMY     TRANSURETHRAL RESECTION OF PROSTATE  10/2005   [facility]   TRANSURETHRAL RESECTION OF PROSTATE  08/2017   (rpt) [facility]   Patient Active Problem List   Diagnosis Date Noted   Bilateral hearing loss 06/30/2022   Brain aneurysm 06/30/2022   Acute stroke due to ischemia ([place]) 06/20/2022   Nephrotic range proteinuria 06/04/2022   Hypoproteinemia ([place])  06/01/2022   Atypical chest pain 07/10/2020   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   Unsteadiness on feet 11/08/2019   History of spinal surgery 01/17/2019   Neck pain 01/17/2019   Weakness of hand 01/17/2019   BPH (benign prostatic hyperplasia) 09/38/1829   Systolic murmur 93/71/6967   Right carpal tunnel syndrome 02/02/2018   Ulnar neuropathy at elbow, left 02/02/2018   Peripheral neuropathy 01/26/2018   Bilateral buttock pain 06/29/2016   Failed vision screen 06/29/2016   Advanced care planning/counseling discussion 06/21/2015   Medicare annual wellness visit, subsequent 06/18/2014   Health maintenance examination 06/18/2014   Chronic ankle pain 06/18/2014   Incomplete emptying of bladder 06/07/2013   Vitamin D deficiency 05/20/2013   THYROID NODULE 05/17/2007   Mixed hyperlipidemia 05/17/2007   Essential tremor 05/17/2007   Essential hypertension 05/17/2007   Cervical spondylosis with myelopathy and radiculopathy 05/17/2007   Seizure disorder ([place]) 05/17/2007    REFERRING DIAG: other polyneuropathy, acute stroke due to ischemia, double vision  THERAPY DIAG:  Difficulty in walking, not elsewhere classified  Unsteadiness on feet  Other disturbances of skin sensation  Rationale for Evaluation and Treatment: Rehabilitation  ONSET DATE: polyneuropathy chronic, CVA/Double vision 06/20/2022  PERTINENT HISTORY: Patient is a 87 y.o. male who presents to outpatient physical therapy with a referral for medical diagnosis other polyneuropathy, acute  stroke due to ischemia, double vision. This patient's chief complaints consist of altered sensation, feeling of tightness in bilateral LE below the knees, R ankle pain, low back pain, and difficulty hearing out of the right ear leading to the following functional deficits: difficulty with balance, household and community mobility, ADLs, transfers, and usual mobility without falling, sleeping. 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    PRECAUTIONS: Fall  SUBJECTIVE: Patient arrives with SPC, air brace on right ankle. He states his right ear is giving his trouble and it seems muffled. He states the top of his left foot was burning and this hasn't happened before. He states it is better now but he can really feel it when he plantarflexed and inverts his ankle. He currently denies pain anywhere else. He has been off of his desipramine for a few days because he had trouble filling it. Patient is frustrated that his wife won't let him do anything outside because of his recent stroke.   PAIN:  Are you having pain? 3/10 top of left ankle/foot.   OBJECTIVE:  Vitals:   07/07/22 0910  BP: 118/63  Pulse: 78  SpO2: 97%      TODAY'S TREATMENT  Therapeutic exercise: to centralize symptoms and improve ROM, strength, muscular endurance, and activity tolerance required for successful completion of functional activities.  -  Vitals measurement prior to exercise - NuStep level 6 using bilateral upper and lower extremities. Seat/handle setting 9/9. For improved  extremity mobility, muscular endurance, and activity tolerance; and to induce the analgesic effect of aerobic exercise, stimulate improved joint nutrition, and prepare body structures and systems for following interventions. x 5  minutes. Average SPM = 71.  Neuromuscular Re-education: to improve, balance, postural strength, muscle activation patterns, and stabilization strength required for functional activities: - static stance, narrow stance, compliant surface, touchdown UE support, 2x1 min, SBA. - static stance, standard stance, compliant surface, touchdown UE support, horizontal head turns, 2x20 each direction, SBA.  - standing alternating foot taps on 8.5 inch surface with intermittent UE support, 2x10 each side. (R ankle with audible popping/grinding and gives out during SLS on that side occasionally). CGA.  - forwards/backwards step over 6 inch hurdle 1x10 with each foot leading, U UE support, SBA.  - flateral steps over 6 inch hurdle 1x20 with each direction, S UE support, SBA.  Pt required multimodal cuing for proper technique and to facilitate improved neuromuscular control, strength, range of motion, and functional ability resulting in improved performance and form.   PATIENT EDUCATION:  Education details: Exercise purpose/form. Self management techniques. Education on diagnosis, prognosis, POC, anatomy and physiology of current condition.    Person educated: Patient Education method: ECustomer service managerEducation comprehension: verbalized understanding and needs further education  HOME EXERCISE PROGRAM: Access Code: [email or web address] URL: [email or web address] Date: 07/01/2022 Prepared by: Rosita Kea   Exercises - Standing Tandem Balance with Counter Support  - 1 x daily - 7 x weekly - 2 sets - 5 reps - 15 hold - Heel Toe Raises with Counter Support  - 1 x daily - 7 x weekly - 2 sets - 12 reps - Lunge with Counter Support  - 1 x daily - 7 x weekly - 2 sets -  12 reps - Gastroc Stretch on Wall  - 1 x daily - 7 x weekly - 2 sets - 3 reps - 30 hold   ASSESSMENT:   CLINICAL IMPRESSION: Patient tolerated treatment with some difficulty due to imbalance and right ankle pain/instability. Patient required CGA-minA for most standing activities for safety. Patient required redirection at times to stay on task. Patient significantly hindered by R ankle giving way. PT recommends patient discuss this with PCP for possible referral to specialist for further assessment of ankle pain/dysfunction. Patient continues to be enthusiastic about participating in PT. Patient would benefit from continued management of limiting condition by skilled physical therapist to address remaining impairments and functional limitations to work towards stated goals and return to PLOF or maximal functional independence.    Patient is a 87 y.o. male referred to outpatient physical therapy with a medical diagnosis of other polyneuropathy, acute stroke due to ischemia, double vision who presents with signs and symptoms consistent with imbalance, difficulty with motor control and ambulation consistent with peripheral neuropathy and possible contribution from history of cervical myelopathy. Patient is negative for concordant symptoms with lumbar AROM and lower extremity neurodynamic testing, significantly decreasing likelihood that primary symptoms are from lumbar radiculopathy or sciatica. Patient presents with significant balance, sensory, motor control, postural control, pain, ROM, joint stiffness, muscle performance (strength/power/endurance), knowledge, and activity tolerance impairments that are limiting ability to complete his usual activities such as balance, household and community mobility, ADLs, transfers, and usual mobility without falling, sleeping without difficulty. Patient will benefit from skilled physical therapy intervention to address current body structure impairments and activity  limitations to improve function and work towards goals set in current POC in order to return to prior level of function or maximal functional improvement.      OBJECTIVE IMPAIRMENTS Abnormal gait, decreased activity tolerance, decreased balance, decreased coordination, decreased endurance, decreased knowledge of condition, decreased mobility, difficulty walking, decreased ROM, decreased strength, increased edema, impaired sensation, and pain.    ACTIVITY LIMITATIONS carrying, lifting, bending, sitting, standing, squatting, sleeping, stairs, transfers, bed mobility, dressing, and locomotion level   PARTICIPATION LIMITATIONS: shopping, community activity, yard work, and   Insurance underwriter, household and community mobility, ADLs, transfers, and usual mobility without falling, sleeping.   PERSONAL FACTORS Age, Past/current experiences, Time since onset of injury/illness/exacerbation, and 3+ comorbidities:   acute CVA on 06/20/2022 (MRI showed punctate acute/subacute nonhemorrhagic infarct in the posterior left lower midbrain near the 4th cranial nerve nucleus with remote lacunar infarcts of the right thalamus and right cerebellum),  65m PCOM aneurysm (found incidentally on CTA 06/20/2022, currently undergoing conservative monitoring), concern over dilantin related neuropathy, systolic murmur, benign prostatic hyperplasia with urinary obstruction (followed by urology), chronic R ankle pain (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    REHAB POTENTIAL: Fair patient previously exhausted improvements in PT and has continued  doing HEP regularly since then. Underlying cause of issue (neuropathy) unlikely to be able to be reversed by PT, remote lacunar infarcts on cerebellum found on recent brain MRI suggest chronic deficits in motor control behind ataxic symtptoms.    CLINICAL DECISION MAKING: Stable/uncomplicated   EVALUATION COMPLEXITY: Low     GOALS: Goals reviewed with patient? No   SHORT TERM GOALS: Target date: 06/24/2022   Patient will be independent with initial home exercise program for self-management of symptoms. Baseline: Initial HEP to be provided at visit 2 as appropriate (06/10/22); initial HEP provided at visit 2 (07/16/2022); Goal status: in-progress     LONG TERM GOALS: Target date: 09/02/2022   Patient will be independent with a long-term home exercise program for self-management of symptoms.  Baseline: Initial HEP to be provided at visit 2 as appropriate (06/10/22); initial HEP provided at visit 2 (07/16/2022); Goal status: In-progress   2.  Patient will demonstrate improved FOTO by equal or greater than 10 points to demonstrate improvement in overall condition and self-reported functional ability.  Baseline: to be measured at visit 2 as appropriate (06/10/22); 51 at visit 2 (06/16/2022); 51 at visit 3 (06/30/2022);  Goal status: In-progress   3.  Patient will score equal or greater than 25/30 on Functional Gait Assessment to demonstrate low fall risk.  Baseline: to be tested at visit 2 as appropriate (06/10/22); 13/30 high fall risk (06/16/2022); 15/30 high fall risk (07/02/2022);  Goal status: In-progress   4.  Patient will balance in tandem stance equal or greater than 15 seconds with each foot front to show improved static balance and decreased fall risk.  Baseline: R front = 5 seconds, L front = <1 second (06/10/22); R front = 5 seconds, L front = <1 second (06/30/2022);  Goal status: In progress   5.  Patient will complete community, work and/or recreational activities without limitation  due to current condition.  Baseline: balance, household and community mobility, ADLs, transfers, and usual mobility without falling, sleeping (06/10/22); Goal status: In-progress       PLAN: PT FREQUENCY: 1-2x/week   PT DURATION: 12 weeks   PLANNED INTERVENTIONS: Therapeutic exercises, Therapeutic activity, Neuromuscular re-education, Balance training, Gait training, Patient/Family education, Joint mobilization, Stair training, DME instructions, Dry Needling, Electrical stimulation, Spinal mobilization, Cryotherapy, Moist heat, Manual therapy, and Re-evaluation.   PLAN FOR NEXT SESSION: FGA, balance exercises, update HEP as appropriate.     Nancy Nordmann, PT, DPT 07/07/2022, 9:45 AM   [facility] [address] [place], [place] [address] P: [phone] I F: [phone]

## 2022-07-09 ENCOUNTER — Other Ambulatory Visit: Payer: Self-pay

## 2022-07-09 ENCOUNTER — Ambulatory Visit: Payer: Medicare Other | Admitting: Physical Therapy

## 2022-07-09 ENCOUNTER — Encounter: Payer: Self-pay | Admitting: Physical Therapy

## 2022-07-09 DIAGNOSIS — R262 Difficulty in walking, not elsewhere classified: Secondary | ICD-10-CM | POA: Diagnosis not present

## 2022-07-09 DIAGNOSIS — R2681 Unsteadiness on feet: Secondary | ICD-10-CM

## 2022-07-09 DIAGNOSIS — R208 Other disturbances of skin sensation: Secondary | ICD-10-CM

## 2022-07-09 NOTE — Therapy (Signed)
OUTPATIENT PHYSICAL THERAPY TREATMENT NOTE   Patient Name: Mark Holmes MRN: [id number] DOB:April 18, 1935, 87 y.o., male Today's Date: 07/09/2022  PCP: Ria Bush, MD REFERRING PROVIDER: Ria Bush, MD  END OF SESSION:   PT End of Session - 07/09/22 1108     Visit Number 6    Number of Visits 24    Date for PT Re-Evaluation 09/02/22    Authorization Type [employer] MEDICARE reporting period from 06/10/2022    Progress Note Due on Visit 10    PT Start Time 0903    PT Stop Time 0945    PT Time Calculation (min) 42 min    Activity Tolerance Patient tolerated treatment well    Behavior During Therapy [facility] for tasks assessed/performed               Past Medical History:  Diagnosis Date   Benign prostatic hypertrophy with elevated PSA    per Dr. Jacqlyn Larsen   History of CT scan of brain 1992   normal   Hyperglycemia 01/2006   102   Hyperlipemia    Hypertension    Seizure disorder [facility])    Past Surgical History:  Procedure Laterality Date   ANTERIOR CERVICAL DECOMP/DISCECTOMY FUSION  05/2018   for myelopathy -C3/4 ([facility])   CARPAL TUNNEL RELEASE Right 06/2019   ([facility])   CATARACT EXTRACTION W/ INTRAOCULAR LENS IMPLANT Right 2012   [place] [facility]   CATARACT EXTRACTION W/PHACO Left 01/14/2022   Procedure: CATARACT EXTRACTION PHACO AND INTRAOCULAR LENS PLACEMENT ([place]) LEFT;  Surgeon: Leandrew Koyanagi, MD;  Location: [place];  Service: Ophthalmology;  Laterality: Left;  7.55 01:03.6   History of EEG  1985   normal   hospitalization  1981   observation for seizuare   TONSILLECTOMY     TRANSURETHRAL RESECTION OF PROSTATE  10/2005   [place] [facility]   TRANSURETHRAL RESECTION OF PROSTATE  08/2017   (rpt) [facility]   Patient Active Problem List   Diagnosis Date Noted   Bilateral hearing loss 06/30/2022   Brain aneurysm 06/30/2022   Acute stroke due to ischemia ([place]) 06/20/2022   Nephrotic range proteinuria 06/04/2022   Hypoproteinemia ([place])  06/01/2022   Atypical chest pain 07/10/2020   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   Unsteadiness on feet 11/08/2019   History of spinal surgery 01/17/2019   Neck pain 01/17/2019   Weakness of hand 01/17/2019   BPH (benign prostatic hyperplasia) 74/11/8785   Systolic murmur 76/72/0947   Right carpal tunnel syndrome 02/02/2018   Ulnar neuropathy at elbow, left 02/02/2018   Peripheral neuropathy 01/26/2018   Bilateral buttock pain 06/29/2016   Failed vision screen 06/29/2016   Advanced care planning/counseling discussion 06/21/2015   Medicare annual wellness visit, subsequent 06/18/2014   Health maintenance examination 06/18/2014   Chronic ankle pain 06/18/2014   Incomplete emptying of bladder 06/07/2013   Vitamin D deficiency 05/20/2013   THYROID NODULE 05/17/2007   Mixed hyperlipidemia 05/17/2007   Essential tremor 05/17/2007   Essential hypertension 05/17/2007   Cervical spondylosis with myelopathy and radiculopathy 05/17/2007   Seizure disorder ([place]) 05/17/2007    REFERRING DIAG: other polyneuropathy, acute stroke due to ischemia, double vision  THERAPY DIAG:  Difficulty in walking, not elsewhere classified  Unsteadiness on feet  Other disturbances of skin sensation  Rationale for Evaluation and Treatment: Rehabilitation  ONSET DATE: polyneuropathy chronic, CVA/Double vision 06/20/2022  PERTINENT HISTORY: Patient is a 87 y.o. male who presents to outpatient physical therapy with a referral for medical diagnosis other polyneuropathy,  acute stroke due to ischemia, double vision. This patient's chief complaints consist of altered sensation, feeling of tightness in bilateral LE below the knees, R ankle pain, low back pain, and difficulty hearing out of the right ear leading to the following functional deficits: difficulty with balance, household and community mobility, ADLs, transfers, and usual mobility without falling, sleeping. 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    PRECAUTIONS: Fall  SUBJECTIVE: Patient arrives with SPC, air brace on right ankle. He does not report pain currently but states he is dizzy sometimes when he gets up. He thinks it is related to when he takes his gabapentin.   PAIN:  Are you having pain? None mentioned upon arrival but R ankle painful and collapsing with SLS weight bearing during session.  OBJECTIVE:   TODAY'S TREATMENT  Therapeutic exercise: to centralize symptoms and improve ROM, strength, muscular endurance, and activity tolerance required for successful completion of functional activities.  - review of long term HEP from past episode of care last year with removal of obsolete exercises and review of remaining exercises.  - seated R ankle circles in figure 4 position, ~ 1x10 - standing R gastroc stretch ~ 1x10 seconds - standing mini lateral lunge, ~ 1x10 to right with UE support as needed.  - standing mini forwards lunge, ~ 1x10 each side with UE support.  - sit <> stand from chair x 5 - static balance with and without airex pad with various  combinations of foot position, and head turn or still educating patient on principles behind making it harder and easier and how he can modify at home based on his ability.  - standing alternating foot taps on 8.5 inch stool in front of him with U UE support as needed, several each side (R ankle collapsing, discontinued).  - agility ladder drills forwards/backwards and side to side ~ 3 times through agility ladder both ways total. Supervision.  - Education on HEP including handout   Pt required multimodal cuing for proper technique and to facilitate improved neuromuscular control, strength, range of motion, and functional ability resulting in improved performance and form.   PATIENT EDUCATION:  Education details: Exercise purpose/form. Self management techniques. Education on diagnosis, prognosis, POC, anatomy and physiology of current condition.    Person educated: Patient Education method: ECustomer service managerEducation comprehension: verbalized understanding, demonstrated understandin, and needs further education     HOME EXERCISE PROGRAM: Access Code: [email or web address] URL: [email or web address] Date: 07/09/2022 Prepared by: SRosita Kea Exercises - Half Tandem Stance Balance with Head Rotation  - 2-3 x weekly - 1 sets - 20 reps - Standing Heel Raises  - 1 x daily - 3 sets - 10 reps - Standing Gastroc Stretch at Counter  - 1 x daily - 3  reps - 30 seconds hold - Seated Ankle Eversion with Resistance  - 1 x daily - 3 sets - 10 reps - Seated Heel Raise  - 3 sets - 10 reps - Lateral Inside Outside with Agility Ladder  - Forward Shuffle- Two Feet In, Two Feet Out with Agility Ladder  - Sit to Stand with Arm Reach and Jump  - 3 x weekly - 3 sets - 10 reps - Side Lunge with Counter Support  - 3 x weekly - 2 sets - 20 reps - Standing Alternating Partial Lunge  - 3 x weekly - 2 sets - 10-20 reps  [email or web address] [id number]  [[email or web address]]   ANKLE CIRCLES -  Repeat 20 Times, Hold 1  Second(s), Complete 3 Sets, Perform 1 Times a Day     ASSESSMENT:   CLINICAL IMPRESSION: Patient tolerated treatment with difficulty due to R ankle pain and instability. Focus of today's session was to update HEP from long term HEP created at last episode of care. Patient demo several instances of R ankle giving way and exercises were updated or modified to improve safety and decrease pain. Plan to review as needed in the future to reinforce safe and effective participation. Patient would benefit from continued management of limiting condition by skilled physical therapist to address remaining impairments and functional limitations to work towards stated goals and return to PLOF or maximal functional independence.    Patient is a 87 y.o. male referred to outpatient physical therapy with a medical diagnosis of other polyneuropathy, acute stroke due to ischemia, double vision who presents with signs and symptoms consistent with imbalance, difficulty with motor control and ambulation consistent with peripheral neuropathy and possible contribution from history of cervical myelopathy. Patient is negative for concordant symptoms with lumbar AROM and lower extremity neurodynamic testing, significantly decreasing likelihood that primary symptoms are from lumbar radiculopathy or sciatica. Patient presents with significant balance, sensory, motor control, postural control, pain, ROM, joint stiffness, muscle performance (strength/power/endurance), knowledge, and activity tolerance impairments that are limiting ability to complete his usual activities such as balance, household and community mobility, ADLs, transfers, and usual mobility without falling, sleeping without difficulty. Patient will benefit from skilled physical therapy intervention to address current body structure impairments and activity limitations to improve function and work towards goals set in current POC in order to return to prior level of function  or maximal functional improvement.      OBJECTIVE IMPAIRMENTS Abnormal gait, decreased activity tolerance, decreased balance, decreased coordination, decreased endurance, decreased knowledge of condition, decreased mobility, difficulty walking, decreased ROM, decreased strength, increased edema, impaired sensation, and pain.    ACTIVITY LIMITATIONS carrying, lifting, bending, sitting, standing, squatting, sleeping, stairs, transfers, bed mobility, dressing, and locomotion level   PARTICIPATION LIMITATIONS: shopping, community activity, yard work, and   Insurance underwriter, household and community mobility, ADLs, transfers, and usual mobility without falling, sleeping.   PERSONAL FACTORS Age, Past/current experiences, Time since onset of injury/illness/exacerbation, and 3+ comorbidities:   acute CVA on 06/20/2022 (MRI showed punctate acute/subacute nonhemorrhagic infarct in the posterior left lower midbrain near the 4th cranial nerve nucleus with remote lacunar infarcts of the right thalamus and right cerebellum),  53m PCOM aneurysm (found incidentally on CTA 06/20/2022, currently undergoing conservative monitoring), concern over dilantin related neuropathy, systolic murmur, benign prostatic hyperplasia with urinary obstruction (followed by urology), chronic R ankle pain (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    REHAB POTENTIAL: Fair patient previously exhausted improvements in PT and has continued doing HEP regularly since then. Underlying cause of issue (neuropathy) unlikely to be able to be reversed by PT, remote  lacunar infarcts on cerebellum found on recent brain MRI suggest chronic deficits in motor control behind ataxic symtptoms.    CLINICAL DECISION MAKING: Stable/uncomplicated   EVALUATION COMPLEXITY: Low     GOALS: Goals reviewed with patient? No   SHORT TERM GOALS: Target date: 06/24/2022   Patient will be independent with initial home exercise program for self-management of symptoms. Baseline: Initial HEP to be provided at visit 2 as appropriate (06/10/22); initial HEP provided at visit 2 (07/16/2022); Goal status: in-progress     LONG TERM GOALS: Target date: 09/02/2022   Patient will be independent with a long-term home exercise program for self-management of symptoms.  Baseline: Initial HEP to be provided at visit 2 as appropriate (06/10/22); initial HEP provided at visit 2 (07/16/2022); Goal status: In-progress   2.  Patient will demonstrate improved FOTO by equal or greater than 10 points to demonstrate improvement in overall condition and self-reported functional ability.  Baseline: to be measured at visit 2 as appropriate (06/10/22); 51 at visit 2 (06/16/2022); 51 at visit 3 (06/30/2022);  Goal status: In-progress   3.  Patient will score equal or greater than 25/30 on Functional Gait Assessment to demonstrate low fall risk.  Baseline: to be tested at visit 2 as appropriate (06/10/22); 13/30 high fall risk (06/16/2022); 15/30 high fall risk (07/02/2022);  Goal status: In-progress   4.  Patient will balance in tandem stance equal or greater than 15 seconds with each foot front to show improved static balance and decreased fall risk.  Baseline: R front = 5 seconds, L front = <1 second (06/10/22); R front = 5 seconds, L front = <1 second (06/30/2022);  Goal status: In progress   5.  Patient will complete community, work and/or recreational activities without limitation due to current condition.  Baseline: balance, household and community mobility, ADLs, transfers, and usual mobility  without falling, sleeping (06/10/22); Goal status: In-progress       PLAN: PT FREQUENCY: 1-2x/week   PT DURATION: 12 weeks   PLANNED INTERVENTIONS: Therapeutic exercises, Therapeutic activity, Neuromuscular re-education, Balance training, Gait training, Patient/Family education, Joint mobilization, Stair training, DME instructions, Dry Needling, Electrical stimulation, Spinal mobilization, Cryotherapy, Moist heat, Manual therapy, and Re-evaluation.   PLAN FOR NEXT SESSION: FGA, balance exercises, update HEP as appropriate.     Nancy Nordmann, PT, DPT 07/09/2022, 11:17 AM   [facility] [address] [place], [place] [address] P: [phone] I F: [phone]

## 2022-07-09 NOTE — Telephone Encounter (Signed)
Dilantin Last rx:  05/19/22, #[id number] Last OV:  06/29/22, hosp f/u Next OV: 11/02/22, CPE

## 2022-07-12 MED ORDER — PHENYTOIN SODIUM EXTENDED 100 MG PO CAPS: 200.0000 mg | ORAL_CAPSULE | Freq: Two times a day (BID) | ORAL | 0 refills | Status: DC

## 2022-07-12 NOTE — Telephone Encounter (Signed)
I've sent in 1 mo supply as pt will likely be coming off dilantin pending neurology recommendations.

## 2022-07-14 ENCOUNTER — Ambulatory Visit: Payer: Medicare Other | Admitting: Physical Therapy

## 2022-07-14 ENCOUNTER — Encounter: Payer: Self-pay | Admitting: Physical Therapy

## 2022-07-14 DIAGNOSIS — R2681 Unsteadiness on feet: Secondary | ICD-10-CM

## 2022-07-14 DIAGNOSIS — R208 Other disturbances of skin sensation: Secondary | ICD-10-CM

## 2022-07-14 DIAGNOSIS — R262 Difficulty in walking, not elsewhere classified: Secondary | ICD-10-CM | POA: Diagnosis not present

## 2022-07-14 NOTE — Therapy (Signed)
OUTPATIENT PHYSICAL THERAPY TREATMENT NOTE   Patient Name: Mark Holmes MRN: [id number] DOB:[birth year], 87 y.o., male Today's Date: 07/14/2022  PCP: Ria Bush, MD REFERRING PROVIDER: Ria Bush, MD  END OF SESSION:   PT End of Session - 07/14/22 1126     Visit Number 7    Number of Visits 24    Date for PT Re-Evaluation 09/02/22    Authorization Type [employer] MEDICARE reporting period from 06/10/2022    Progress Note Due on Visit 10    PT Start Time 0947    PT Stop Time 1025    PT Time Calculation (min) 38 min    Activity Tolerance Patient tolerated treatment well    Behavior During Therapy [facility] for tasks assessed/performed                Past Medical History:  Diagnosis Date   Benign prostatic hypertrophy with elevated PSA    per Dr. Jacqlyn Larsen   History of CT scan of brain 1992   normal   Hyperglycemia 01/2006   102   Hyperlipemia    Hypertension    Seizure disorder [facility])    Past Surgical History:  Procedure Laterality Date   ANTERIOR CERVICAL DECOMP/DISCECTOMY FUSION  05/2018   for myelopathy -C3/4 ([place])   CARPAL TUNNEL RELEASE Right 06/2019   ([facility])   CATARACT EXTRACTION W/ INTRAOCULAR LENS IMPLANT Right 2012   [facility]   CATARACT EXTRACTION W/PHACO Left 01/14/2022   Procedure: CATARACT EXTRACTION PHACO AND INTRAOCULAR LENS PLACEMENT ([place]) LEFT;  Surgeon: Leandrew Koyanagi, MD;  Location: [place];  Service: Ophthalmology;  Laterality: Left;  7.55 01:03.6   History of EEG  1985   normal   hospitalization  1981   observation for seizuare   TONSILLECTOMY     TRANSURETHRAL RESECTION OF PROSTATE  10/2005   [facility]   TRANSURETHRAL RESECTION OF PROSTATE  08/2017   (rpt) [facility]   Patient Active Problem List   Diagnosis Date Noted   Bilateral hearing loss 06/30/2022   Brain aneurysm 06/30/2022   Acute stroke due to ischemia ([place]) 06/20/2022   Nephrotic range proteinuria 06/04/2022   Hypoproteinemia ([place])  06/01/2022   Atypical chest pain 07/10/2020   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   Unsteadiness on feet 11/08/2019   History of spinal surgery 01/17/2019   Neck pain 01/17/2019   Weakness of hand 01/17/2019   BPH (benign prostatic hyperplasia) 53/61/4431   Systolic murmur 54/00/8676   Right carpal tunnel syndrome 02/02/2018   Ulnar neuropathy at elbow, left 02/02/2018   Peripheral neuropathy 01/26/2018   Bilateral buttock pain 06/29/2016   Failed vision screen 06/29/2016   Advanced care planning/counseling discussion 06/21/2015   Medicare annual wellness visit, subsequent 06/18/2014   Health maintenance examination 06/18/2014   Chronic ankle pain 06/18/2014   Incomplete emptying of bladder 06/07/2013   Vitamin D deficiency 05/20/2013   THYROID NODULE 05/17/2007   Mixed hyperlipidemia 05/17/2007   Essential tremor 05/17/2007   Essential hypertension 05/17/2007   Cervical spondylosis with myelopathy and radiculopathy 05/17/2007   Seizure disorder ([place]) 05/17/2007    REFERRING DIAG: other polyneuropathy, acute stroke due to ischemia, double vision  THERAPY DIAG:  Difficulty in walking, not elsewhere classified  Unsteadiness on feet  Other disturbances of skin sensation  Rationale for Evaluation and Treatment: Rehabilitation  ONSET DATE: polyneuropathy chronic, CVA/Double vision 06/20/2022  PERTINENT HISTORY: Patient is a 87 y.o. male who presents to outpatient physical therapy with a referral for medical diagnosis other  polyneuropathy, acute stroke due to ischemia, double vision. This patient's chief complaints consist of altered sensation, feeling of tightness in bilateral LE below the knees, R ankle pain, low back pain, and difficulty hearing out of the right ear leading to the following functional deficits: difficulty with balance, household and community mobility, ADLs, transfers, and usual mobility without falling, sleeping. 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    PRECAUTIONS: Fall  SUBJECTIVE: Patient arrives with SPC, air brace on right ankle. He reports a little pain in his right ankle. Reports no falls since last PT session. Denies falls since last PT session. States his HEP is going okay. He has not contacted his doctor about his ankle. He is concerned about his kidneys.   PAIN:  Are you having pain?a little pain in the right ankle.   OBJECTIVE:   TODAY'S TREATMENT  Neuromuscular Re-education: to improve, balance, postural strength, muscle activation patterns, and stabilization strength required for functional activities: - static standing with narrow stance on airex, head turns, 3x20 each direction.  - agility ladder drills side to side 4 times through ladder,  forwards/backwards 2 times through with each foot leading. CGA. Significant instability with occasional giving way of R ankle requiring MinA to prevent falls.  - standing alternating foot taps on 8.5 TM platform in front of him with U UE support as needed, 2x20 each side.  - lateral step over 6 inch hurdle, 1x20 each  direction with unilateral to bilateral UE support with CGA.  - forward/backwards step over 6 inch hurdle, 1x10 leading with each foot, CGA with U UE support.   Pt required multimodal cuing for proper technique and to facilitate improved neuromuscular control, strength, range of motion, and functional ability resulting in improved performance and form.   PATIENT EDUCATION:  Education details: Exercise purpose/form. Self management techniques. Education on diagnosis, prognosis, POC, anatomy and physiology of current condition.    Person educated: Patient Education method: ECustomer service managerEducation comprehension: verbalized understanding, demonstrated understandin, and needs further education     HOME EXERCISE PROGRAM: Access Code: [email or web address] URL: [email or web address] Date: 07/09/2022 Prepared by: SRosita Kea Exercises - Half Tandem Stance Balance with Head Rotation  - 2-3 x weekly - 1 sets - 20 reps - Standing Heel Raises  - 1 x daily - 3 sets - 10 reps - Standing Gastroc Stretch at Counter  - 1 x daily - 3 reps - 30 seconds hold - Seated Ankle Eversion with Resistance  - 1 x daily - 3 sets - 10 reps - Seated Heel Raise  - 3 sets - 10 reps - Lateral Inside Outside with Agility Ladder  - Forward Shuffle- Two Feet In, Two Feet Out with Agility Ladder  - Sit to Stand with Arm Reach  and Jump  - 3 x weekly - 3 sets - 10 reps - Side Lunge with Counter Support  - 3 x weekly - 2 sets - 20 reps - Standing Alternating Partial Lunge  - 3 x weekly - 2 sets - 10-20 reps  [email or web address]  [[id number]]   ANKLE CIRCLES -  Repeat 20 Times, Hold 1 Second(s), Complete 3 Sets, Perform 1 Times a Day     ASSESSMENT:   CLINICAL IMPRESSION: Patient tolerated treatment with difficulty due to R ankle pain and instability which appeared to be the largest contributor to his fall risk and imbalance today. He had several episodes of his R ankle giving out which required UE support  or PT physical assist to prevent a fall. Patient continued working on static and dynamic balance and functional mobility. He required frequent cuing and guarding to improve effectiveness and safety of exercises. Patient would benefit from further medical evaluation of the R ankle and consideration of a brace that may decrease his R ankle from collapsing during weight bearing activity. Patient would benefit from continued management of limiting condition by skilled physical therapist to address remaining impairments and functional limitations to work towards stated goals and return to PLOF or maximal functional independence.    Patient is a 87 y.o. male referred to outpatient physical therapy with a medical diagnosis of other polyneuropathy, acute stroke due to ischemia, double vision who presents with signs and symptoms consistent with imbalance, difficulty with motor control and ambulation consistent with peripheral neuropathy and possible contribution from history of cervical myelopathy. Patient is negative for concordant symptoms with lumbar AROM and lower extremity neurodynamic testing, significantly decreasing likelihood that primary symptoms are from lumbar radiculopathy or sciatica. Patient presents with significant balance, sensory, motor control, postural control, pain, ROM, joint stiffness, muscle performance (strength/power/endurance), knowledge, and activity tolerance impairments that are limiting ability to complete his usual activities such as balance, household and community mobility, ADLs, transfers, and usual mobility without falling, sleeping without difficulty. Patient will benefit from skilled physical therapy intervention to address current body structure impairments and activity limitations to improve function and work towards goals set in current POC in order to return to prior level of function or maximal functional improvement.      OBJECTIVE IMPAIRMENTS Abnormal gait, decreased  activity tolerance, decreased balance, decreased coordination, decreased endurance, decreased knowledge of condition, decreased mobility, difficulty walking, decreased ROM, decreased strength, increased edema, impaired sensation, and pain.    ACTIVITY LIMITATIONS carrying, lifting, bending, sitting, standing, squatting, sleeping, stairs, transfers, bed mobility, dressing, and locomotion level   PARTICIPATION LIMITATIONS: shopping, community activity, yard work, and   Insurance underwriter, household and community mobility, ADLs, transfers, and usual mobility without falling, sleeping.   PERSONAL FACTORS Age, Past/current experiences, Time since onset of injury/illness/exacerbation, and 3+ comorbidities:   acute CVA on 06/20/2022 (MRI showed punctate acute/subacute nonhemorrhagic infarct in the posterior left lower midbrain near the 4th cranial nerve nucleus with remote lacunar infarcts of the right thalamus and right cerebellum),  69m PCOM aneurysm (found incidentally on CTA 06/20/2022, currently undergoing conservative monitoring), concern over dilantin related neuropathy, systolic murmur, benign prostatic hyperplasia with urinary obstruction (followed by urology), chronic R ankle pain (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    REHAB POTENTIAL: Fair patient previously exhausted improvements in PT and has continued doing HEP regularly since then. Underlying cause of issue (neuropathy) unlikely to be able to be reversed by PT, remote lacunar infarcts on cerebellum found on recent brain MRI suggest chronic deficits in motor  control behind ataxic symtptoms.    CLINICAL DECISION MAKING: Stable/uncomplicated   EVALUATION COMPLEXITY: Low     GOALS: Goals reviewed with patient? No   SHORT TERM GOALS: Target date: 06/24/2022   Patient will be independent with initial home exercise program for self-management of symptoms. Baseline: Initial HEP to be provided at visit 2 as appropriate (06/10/22); initial HEP provided at visit 2 (07/16/2022); Goal status: in-progress     LONG TERM GOALS: Target date: 09/02/2022   Patient will be independent with a long-term home exercise program for self-management of symptoms.  Baseline: Initial HEP to be provided at visit 2 as appropriate (06/10/22); initial HEP provided at visit 2 (07/16/2022); Goal status: In-progress   2.  Patient will demonstrate improved FOTO by equal or greater than 10 points to demonstrate improvement in overall condition and self-reported functional ability.  Baseline: to be measured at visit 2 as appropriate (06/10/22); 51 at visit 2 (06/16/2022); 51 at visit 3 (06/30/2022);  Goal status: In-progress   3.  Patient will score equal or greater than 25/30 on Functional Gait Assessment to demonstrate low fall risk.  Baseline: to be tested at visit 2 as appropriate (06/10/22); 13/30 high fall risk (06/16/2022); 15/30 high fall risk (07/02/2022);  Goal status: In-progress   4.  Patient will balance in tandem stance equal or greater than 15 seconds with each foot front to show improved static balance and decreased fall risk.  Baseline: R front = 5 seconds, L front = <1 second (06/10/22); R front = 5 seconds, L front = <1 second (06/30/2022);  Goal status: In progress   5.  Patient will complete community, work and/or recreational activities without limitation due to current condition.  Baseline: balance, household and community mobility, ADLs, transfers, and usual mobility without falling, sleeping (06/10/22); Goal status: In-progress       PLAN: PT  FREQUENCY: 1-2x/week   PT DURATION: 12 weeks   PLANNED INTERVENTIONS: Therapeutic exercises, Therapeutic activity, Neuromuscular re-education, Balance training, Gait training, Patient/Family education, Joint mobilization, Stair training, DME instructions, Dry Needling, Electrical stimulation, Spinal mobilization, Cryotherapy, Moist heat, Manual therapy, and Re-evaluation.   PLAN FOR NEXT SESSION: FGA, balance exercises, update HEP as appropriate.     Nancy Nordmann, PT, DPT 07/14/2022, 11:40 AM   [facility] [address] [place], [place] [address] P: [phone] I F: [phone]

## 2022-07-16 ENCOUNTER — Encounter: Payer: Self-pay | Admitting: Physical Therapy

## 2022-07-16 ENCOUNTER — Ambulatory Visit: Payer: Medicare Other | Admitting: Physical Therapy

## 2022-07-16 ENCOUNTER — Encounter: Payer: Self-pay | Admitting: Family Medicine

## 2022-07-16 VITALS — BP 147/81 | HR 89

## 2022-07-16 DIAGNOSIS — R262 Difficulty in walking, not elsewhere classified: Secondary | ICD-10-CM

## 2022-07-16 DIAGNOSIS — M25371 Other instability, right ankle: Secondary | ICD-10-CM

## 2022-07-16 DIAGNOSIS — R208 Other disturbances of skin sensation: Secondary | ICD-10-CM

## 2022-07-16 DIAGNOSIS — R2681 Unsteadiness on feet: Secondary | ICD-10-CM

## 2022-07-16 DIAGNOSIS — G8929 Other chronic pain: Secondary | ICD-10-CM

## 2022-07-16 NOTE — Therapy (Signed)
OUTPATIENT PHYSICAL THERAPY TREATMENT NOTE   Patient Name: Mark Holmes MRN: [id number] DOB:20-Apr-1935, 87 y.o., male Today's Date: 07/16/2022  PCP: Ria Bush, MD REFERRING PROVIDER: Ria Bush, MD  END OF SESSION:   PT End of Session - 07/16/22 1101     Visit Number 8    Number of Visits 24    Date for PT Re-Evaluation 09/02/22    Authorization Type [employer] MEDICARE reporting period from 06/10/2022    Progress Note Due on Visit 10    PT Start Time 0952    PT Stop Time 1030    PT Time Calculation (min) 38 min    Activity Tolerance Patient limited by pain    Behavior During Therapy [facility] for tasks assessed/performed              Past Medical History:  Diagnosis Date   Benign prostatic hypertrophy with elevated PSA    per Dr. Jacqlyn Larsen   History of CT scan of brain 1992   normal   Hyperglycemia 01/2006   102   Hyperlipemia    Hypertension    Seizure disorder [facility])    Past Surgical History:  Procedure Laterality Date   ANTERIOR CERVICAL DECOMP/DISCECTOMY FUSION  05/2018   for myelopathy -C3/4 ([place] [facility])   CARPAL TUNNEL RELEASE Right 06/2019   ([place])   CATARACT EXTRACTION W/ INTRAOCULAR LENS IMPLANT Right 2012   [place] [facility]   CATARACT EXTRACTION W/PHACO Left 01/14/2022   Procedure: CATARACT EXTRACTION PHACO AND INTRAOCULAR LENS PLACEMENT ([place]) LEFT;  Surgeon: Leandrew Koyanagi, MD;  Location: [place];  Service: Ophthalmology;  Laterality: Left;  7.55 01:03.6   History of EEG  1985   normal   hospitalization  1981   observation for seizuare   TONSILLECTOMY     TRANSURETHRAL RESECTION OF PROSTATE  10/2005   [facility]   TRANSURETHRAL RESECTION OF PROSTATE  08/2017   (rpt) [place]   Patient Active Problem List   Diagnosis Date Noted   Bilateral hearing loss 06/30/2022   Brain aneurysm 06/30/2022   Acute stroke due to ischemia ([place]) 06/20/2022   Nephrotic range proteinuria 06/04/2022   Hypoproteinemia ([place]) 06/01/2022    Atypical chest pain 07/10/2020   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   Unsteadiness on feet 11/08/2019   History of spinal surgery 01/17/2019   Neck pain 01/17/2019   Weakness of hand 01/17/2019   BPH (benign prostatic hyperplasia) 73/71/0626   Systolic murmur 94/85/4627   Right carpal tunnel syndrome 02/02/2018   Ulnar neuropathy at elbow, left 02/02/2018   Peripheral neuropathy 01/26/2018   Bilateral buttock pain 06/29/2016   Failed vision screen 06/29/2016   Advanced care planning/counseling discussion 06/21/2015   Medicare annual wellness visit, subsequent 06/18/2014   Health maintenance examination 06/18/2014   Chronic ankle pain 06/18/2014   Incomplete emptying of bladder 06/07/2013   Vitamin D deficiency 05/20/2013   THYROID NODULE 05/17/2007   Mixed hyperlipidemia 05/17/2007   Essential tremor 05/17/2007   Essential hypertension 05/17/2007   Cervical spondylosis with myelopathy and radiculopathy 05/17/2007   Seizure disorder ([place]) 05/17/2007    REFERRING DIAG: other polyneuropathy, acute stroke due to ischemia, double vision  THERAPY DIAG:  Difficulty in walking, not elsewhere classified  Unsteadiness on feet  Other disturbances of skin sensation  Rationale for Evaluation and Treatment: Rehabilitation  ONSET DATE: polyneuropathy chronic, CVA/Double vision 06/20/2022  PERTINENT HISTORY: Patient is a 87 y.o. male who presents to outpatient physical therapy with a referral for medical diagnosis other polyneuropathy, acute  stroke due to ischemia, double vision. This patient's chief complaints consist of altered sensation, feeling of tightness in bilateral LE below the knees, R ankle pain, low back pain, and difficulty hearing out of the right ear leading to the following functional deficits: difficulty with balance, household and community mobility, ADLs, transfers, and usual mobility without falling, sleeping. Relevant past medical history and comorbidities include acute CVA on  06/20/2022 (MRI showed punctate acute/subacute nonhemorrhagic infarct in the posterior left lower midbrain near the 4th cranial nerve nucleus with remote lacunar infarcts of the right thalamus and right cerebellum),  66m PCOM aneurysm (found incidentally on CTA 06/20/2022, currently undergoing conservative monitoring), concern over dilantin related neuropathy, systolic murmur, benign prostatic hyperplasia with urinary obstruction (followed by urology), chronic R ankle pain (likely due to post traumatic arthritis), seizure disorder (controlled over many years with dilantin), low back pain, ACDR for myelopathy at C3-C4, 05/2018), cataract surgeries (bilateral), right Carpal tunnel release (06/2019), symptoms of peripheral neuropathy at all limbs (fine motor skills in B UE affected), perfuse degenerative changes on lumbar and cervical spine imaging (see chart for details).    PRECAUTIONS: Fall  SUBJECTIVE: Patient arrives with SPC, air brace on right ankle. He states he feels a little dizzy today. He thinks it is from side effects of gabapentin and dilantin. He is going to switch from dilantin to another drug and wonders if this will be beter. He denies pain currently or falls since last PT session. He was sore in his right ankle after last PT session. He did contact his doctor about his ankle.   PAIN:  Are you having pain? no  OBJECTIVE:  Vitals:   07/16/22 0957  BP: (!) 147/81  Pulse: 89  SpO2: 96%     TODAY'S TREATMENT  Therapeutic exercise: to centralize symptoms and improve ROM, strength, muscular endurance, and activity tolerance required for successful completion of functional activities.  - vitals measurement (see above) - sit <> stand with ball toss, 3x10  Neuromuscular Re-education: to improve, balance, postural strength, muscle activation patterns, and stabilization strength required for functional activities:  - static standing with narrow stance on airex, head turns, 3x20 each direction.    Donned leafspring AFO on R ankle (leafspring found in vault on lower shelf on the left, owned by clinic).   - ambulated 2x20 feet with no AD (feels more secure).  - standing alternating foot taps on 8.5 TM platform in front of him with intermittent U UE support as needed, 2x20 each side.  - forward/backwards step over 6 inch hurdle, 1x20 leading with each foot, CGA with U UE support.  - lateral step over 6 inch hurdle, 1x20 each direction, CGA with U UE support.   - attempted agility ladder drills forwards/backwards but unable to continue due to R ankle pain and instability.   Doffed leafspring AFO and donned air cast.   - ambulation approx 100 feet from clinic down ramp to vehicle with SPC and SBA for safety.   Pt required multimodal cuing for proper technique and to facilitate improved neuromuscular control, strength, range of motion, and functional ability resulting in improved performance and form.   PATIENT EDUCATION:  Education details: Exercise purpose/form. Self management techniques. Education on diagnosis, prognosis, POC, anatomy and physiology of current condition.    Person educated: Patient Education method: ECustomer service managerEducation comprehension: verbalized understanding, demonstrated understandin, and needs further education     HOME EXERCISE PROGRAM: Access Code: [email or web address] URL: [email or web address] Date: 07/09/2022 Prepared by: SClarise Cruz  Lupie Sawa  Exercises - Half Tandem Stance Balance with Head Rotation  - 2-3 x weekly - 1 sets - 20 reps - Standing Heel Raises  - 1 x daily - 3 sets - 10 reps - Standing Gastroc Stretch at Counter  - 1 x daily - 3 reps - 30 seconds hold - Seated Ankle Eversion with Resistance  - 1 x daily - 3 sets - 10 reps - Seated Heel Raise  - 3 sets - 10 reps - Lateral Inside Outside with Agility Ladder  - Forward Shuffle- Two Feet In, Two Feet Out with Agility Ladder  - Sit to Stand with Arm Reach and Jump  - 3 x  weekly - 3 sets - 10 reps - Side Lunge with Counter Support  - 3 x weekly - 2 sets - 20 reps - Standing Alternating Partial Lunge  - 3 x weekly - 2 sets - 10-20 reps  [email or web address] [id number]  [[id number]]   ANKLE CIRCLES -  Repeat 20 Times, Hold 1 Second(s), Complete 3 Sets, Perform 1 Times a Day     ASSESSMENT:   CLINICAL IMPRESSION: Patient continues to struggle with R ankle pain and instability that is most contributing to his unsteadiness at this point. It gives way, which throws him off balance and makes it difficult for him to use ankle or stepping strategy to prevent falls. Tried using a leafspring AFO on R ankle while in clinic to see if it provided increased support to decrease instability. Patient reported it felt good while walking but he was still unable to complete all exercises due to pain and instability on the R ankle. No sign it was cutting into his skin upon inspection upon removal. Message sent by PT and patient to PCP about limitations from R ankle and PT recommendation for further medical follow up. Plan to continue working on balance and functional strength as tolerated next session. Patient would benefit from continued management of limiting condition by skilled physical therapist to address remaining impairments and functional limitations to work towards stated goals and return to PLOF or maximal functional independence.    Patient is a 87 y.o. male referred to outpatient physical therapy with a medical diagnosis of other polyneuropathy, acute stroke due to ischemia, double vision who presents with signs and symptoms consistent with imbalance, difficulty with motor control and ambulation consistent with peripheral neuropathy and possible contribution from history of cervical myelopathy. Patient is negative for concordant symptoms with lumbar AROM and lower extremity neurodynamic testing, significantly decreasing likelihood that primary symptoms are from lumbar radiculopathy or sciatica.  Patient presents with significant balance, sensory, motor control, postural control, pain, ROM, joint stiffness, muscle performance (strength/power/endurance), knowledge, and activity tolerance impairments that are limiting ability to complete his usual activities such as balance, household and community mobility, ADLs, transfers, and usual mobility without falling, sleeping without difficulty. Patient will benefit from skilled physical therapy intervention to address current body structure impairments and activity limitations to improve function and work towards goals set in current POC in order to return to prior level of function or maximal functional improvement.      OBJECTIVE IMPAIRMENTS Abnormal gait, decreased activity tolerance, decreased balance, decreased coordination, decreased endurance, decreased knowledge of condition, decreased mobility, difficulty walking, decreased ROM, decreased strength, increased edema, impaired sensation, and pain.    ACTIVITY LIMITATIONS carrying, lifting, bending, sitting, standing, squatting, sleeping, stairs, transfers, bed mobility, dressing, and locomotion level   PARTICIPATION LIMITATIONS: shopping, community activity, yard work, and   balance,  household and community mobility, ADLs, transfers, and usual mobility without falling, sleeping.   PERSONAL FACTORS Age, Past/current experiences, Time since onset of injury/illness/exacerbation, and 3+ comorbidities:   acute CVA on 06/20/2022 (MRI showed punctate acute/subacute nonhemorrhagic infarct in the posterior left lower midbrain near the 4th cranial nerve nucleus with remote lacunar infarcts of the right thalamus and right cerebellum),  56m PCOM aneurysm (found incidentally on CTA 06/20/2022, currently undergoing conservative monitoring), concern over dilantin related neuropathy, systolic murmur, benign prostatic hyperplasia with urinary obstruction (followed by urology), chronic R ankle pain (likely due to post  traumatic arthritis), seizure disorder (controlled over many years with dilantin), low back pain, ACDR for myelopathy at C3-C4, 05/2018), cataract surgeries (bilateral), right Carpal tunnel release (06/2019), symptoms of peripheral neuropathy at all limbs (fine motor skills in B UE affected), perfuse degenerative changes on lumbar and cervical spine imaging (see chart for details) are also affecting patient's functional outcome.    REHAB POTENTIAL: Fair patient previously exhausted improvements in PT and has continued doing HEP regularly since then. Underlying cause of issue (neuropathy) unlikely to be able to be reversed by PT, remote lacunar infarcts on cerebellum found on recent brain MRI suggest chronic deficits in motor control behind ataxic symtptoms.    CLINICAL DECISION MAKING: Stable/uncomplicated   EVALUATION COMPLEXITY: Low     GOALS: Goals reviewed with patient? No   SHORT TERM GOALS: Target date: 06/24/2022   Patient will be independent with initial home exercise program for self-management of symptoms. Baseline: Initial HEP to be provided at visit 2 as appropriate (06/10/22); initial HEP provided at visit 2 (07/16/2022); Goal status: in-progress     LONG TERM GOALS: Target date: 09/02/2022   Patient will be independent with a long-term home exercise program for self-management of symptoms.  Baseline: Initial HEP to be provided at visit 2 as appropriate (06/10/22); initial HEP provided at visit 2 (07/16/2022); Goal status: In-progress   2.  Patient will demonstrate improved FOTO by equal or greater than 10 points to demonstrate improvement in overall condition and self-reported functional ability.  Baseline: to be measured at visit 2 as appropriate (06/10/22); 51 at visit 2 (06/16/2022); 51 at visit 3 (06/30/2022);  Goal status: In-progress   3.  Patient will score equal or greater than 25/30 on Functional Gait Assessment to demonstrate low fall risk.  Baseline: to be tested at  visit 2 as appropriate (06/10/22); 13/30 high fall risk (06/16/2022); 15/30 high fall risk (07/02/2022);  Goal status: In-progress   4.  Patient will balance in tandem stance equal or greater than 15 seconds with each foot front to show improved static balance and decreased fall risk.  Baseline: R front = 5 seconds, L front = <1 second (06/10/22); R front = 5 seconds, L front = <1 second (06/30/2022);  Goal status: In progress   5.  Patient will complete community, work and/or recreational activities without limitation due to current condition.  Baseline: balance, household and community mobility, ADLs, transfers, and usual mobility without falling, sleeping (06/10/22); Goal status: In-progress       PLAN: PT FREQUENCY: 1-2x/week   PT DURATION: 12 weeks   PLANNED INTERVENTIONS: Therapeutic exercises, Therapeutic activity, Neuromuscular re-education, Balance training, Gait training, Patient/Family education, Joint mobilization, Stair training, DME instructions, Dry Needling, Electrical stimulation, Spinal mobilization, Cryotherapy, Moist heat, Manual therapy, and Re-evaluation.   PLAN FOR NEXT SESSION: FGA, balance exercises, update HEP as appropriate.     SNancy Nordmann PT, DPT 07/16/2022, 11:42 AM  [facility] [address] [place], [place] [address] P: [phone] I F: [phone]

## 2022-07-20 ENCOUNTER — Ambulatory Visit: Payer: Medicare Other | Admitting: Physical Therapy

## 2022-07-21 ENCOUNTER — Telehealth: Payer: Self-pay

## 2022-07-21 ENCOUNTER — Ambulatory Visit: Payer: Medicare Other | Admitting: Cardiovascular Disease

## 2022-07-21 ENCOUNTER — Encounter: Payer: Self-pay | Admitting: Physical Therapy

## 2022-07-21 ENCOUNTER — Ambulatory Visit: Payer: Medicare Other | Attending: Family Medicine

## 2022-07-21 DIAGNOSIS — G8929 Other chronic pain: Secondary | ICD-10-CM | POA: Diagnosis present

## 2022-07-21 DIAGNOSIS — R262 Difficulty in walking, not elsewhere classified: Secondary | ICD-10-CM | POA: Insufficient documentation

## 2022-07-21 DIAGNOSIS — R208 Other disturbances of skin sensation: Secondary | ICD-10-CM | POA: Diagnosis present

## 2022-07-21 DIAGNOSIS — M545 Low back pain, unspecified: Secondary | ICD-10-CM | POA: Insufficient documentation

## 2022-07-21 DIAGNOSIS — R2681 Unsteadiness on feet: Secondary | ICD-10-CM | POA: Diagnosis present

## 2022-07-21 DIAGNOSIS — Z9181 History of falling: Secondary | ICD-10-CM | POA: Diagnosis present

## 2022-07-21 NOTE — Telephone Encounter (Signed)
Received Medical Clarification Request form from [facility].  Manufacturer change for generic phenytoin EX 100 mg cap.  Placed form in Dr. Synthia Innocent box.

## 2022-07-21 NOTE — Therapy (Signed)
OUTPATIENT PHYSICAL THERAPY TREATMENT NOTE   Patient Name: Mark Holmes MRN: [id number] DOB:03-26-1935, 87 y.o., male Today's Date: 07/21/2022  PCP: Ria Bush, MD REFERRING PROVIDER: Ria Bush, MD  END OF SESSION:   PT End of Session - 07/21/22 1031     Visit Number 9    Number of Visits 24    Date for PT Re-Evaluation 09/02/22    Authorization Type [employer] MEDICARE reporting period from 06/10/2022    Progress Note Due on Visit 10    PT Start Time 1030    PT Stop Time 1113    PT Time Calculation (min) 43 min    Activity Tolerance Patient limited by pain;Patient tolerated treatment well    Behavior During Therapy [facility] for tasks assessed/performed              Past Medical History:  Diagnosis Date   Benign prostatic hypertrophy with elevated PSA    per Dr. Jacqlyn Larsen   History of CT scan of brain 1992   normal   Hyperglycemia 01/2006   102   Hyperlipemia    Hypertension    Seizure disorder [facility])    Past Surgical History:  Procedure Laterality Date   ANTERIOR CERVICAL DECOMP/DISCECTOMY FUSION  05/2018   for myelopathy -C3/4 ([place] [facility])   CARPAL TUNNEL RELEASE Right 06/2019   ([place])   CATARACT EXTRACTION W/ INTRAOCULAR LENS IMPLANT Right 2012   [place] [facility]   CATARACT EXTRACTION W/PHACO Left 01/14/2022   Procedure: CATARACT EXTRACTION PHACO AND INTRAOCULAR LENS PLACEMENT ([place]) LEFT;  Surgeon: Leandrew Koyanagi, MD;  Location: [place];  Service: Ophthalmology;  Laterality: Left;  7.55 01:03.6   History of EEG  1985   normal   hospitalization  1981   observation for seizuare   TONSILLECTOMY     TRANSURETHRAL RESECTION OF PROSTATE  10/2005   [facility]   TRANSURETHRAL RESECTION OF PROSTATE  08/2017   (rpt) [facility]   Patient Active Problem List   Diagnosis Date Noted   Bilateral hearing loss 06/30/2022   Brain aneurysm 06/30/2022   Acute stroke due to ischemia ([place]) 06/20/2022   Nephrotic range proteinuria 06/04/2022    Hypoproteinemia ([place]) 06/01/2022   Atypical chest pain 07/10/2020   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   Unsteadiness on feet 11/08/2019   History of spinal surgery 01/17/2019   Neck pain 01/17/2019   Weakness of hand 01/17/2019   BPH (benign prostatic hyperplasia) 42/68/3419   Systolic murmur 62/22/9798   Right carpal tunnel syndrome 02/02/2018   Ulnar neuropathy at elbow, left 02/02/2018   Peripheral neuropathy 01/26/2018   Bilateral buttock pain 06/29/2016   Failed vision screen 06/29/2016   Advanced care planning/counseling discussion 06/21/2015   Medicare annual wellness visit, subsequent 06/18/2014   Health maintenance examination 06/18/2014   Chronic ankle pain 06/18/2014   Incomplete emptying of bladder 06/07/2013   Vitamin D deficiency 05/20/2013   THYROID NODULE 05/17/2007   Mixed hyperlipidemia 05/17/2007   Essential tremor 05/17/2007   Essential hypertension 05/17/2007   Cervical spondylosis with myelopathy and radiculopathy 05/17/2007   Seizure disorder ([place]) 05/17/2007    REFERRING DIAG: other polyneuropathy, acute stroke due to ischemia, double vision  THERAPY DIAG:  Difficulty in walking, not elsewhere classified  Unsteadiness on feet  Other disturbances of skin sensation  Chronic bilateral low back pain, unspecified whether sciatica present  History of falling  Rationale for Evaluation and Treatment: Rehabilitation  ONSET DATE: polyneuropathy chronic, CVA/Double vision 06/20/2022  PERTINENT HISTORY: Patient is a 87  y.o. male who presents to outpatient physical therapy with a referral for medical diagnosis other polyneuropathy, acute stroke due to ischemia, double vision. This patient's chief complaints consist of altered sensation, feeling of tightness in bilateral LE below the knees, R ankle pain, low back pain, and difficulty hearing out of the right ear leading to the following functional deficits: difficulty with balance, household and community mobility,  ADLs, transfers, and usual mobility without falling, sleeping. 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    PRECAUTIONS: Fall  SUBJECTIVE: Pt arrived with SPC. Reports neurology appointment went well. Still having R ankle pain requiring analgesic ointment. No falls.  PAIN:  Are you having pain? no  OBJECTIVE:  There were no vitals filed for this visit.    TODAY'S TREATMENT  07/21/22 There.ex:  STS with ball toss: 3x10   [place] TB R ankle strengthening: PF/INV/EVER: x20/direction    Neuro Re-ed: Static standing with narrow stance on airex, head turns, 3x20 each direction  SLS on airex, 2x30sec/foot, CGA, relies on BUE support due to R ankle pain standing on R foot. SUE support on LLE  SLS on firm surface. 2x30sec/foot, SBA. Reliant on 2 finger support throughout BLE'S  Alternating foot taps on treadmill platform: 2x20/LE, CGA.   CW/CCW four square step overs for stepping strategy and coordination in multi-planar directions: x5/direction. Endorses significant R hip pain during stance phase requiring CGA  to minA throughout exercise. Limited step lengths bilat due to R hip pain limiting neutral hip and knee extension. Required seated rest break.           PATIENT EDUCATION:  Education details: Exercise purpose/form. Self management techniques. Education on diagnosis, prognosis, POC, anatomy and physiology of current condition.    Person educated: Patient Education method: ECustomer service managerEducation comprehension: verbalized understanding, demonstrated understandin, and needs further education     HOME EXERCISE PROGRAM: Access Code: [email or web address] URL: [email or web address] Date: 07/09/2022 Prepared by: SRosita Kea Exercises - Half Tandem Stance Balance with Head Rotation  - 2-3 x weekly - 1 sets - 20 reps - Standing Heel Raises  - 1 x daily - 3 sets - 10 reps - Standing Gastroc Stretch at Counter  - 1 x daily - 3 reps - 30 seconds hold - Seated Ankle Eversion with Resistance  - 1 x daily - 3 sets - 10 reps - Seated Heel Raise  - 3 sets - 10 reps - Lateral Inside Outside with Agility Ladder  - Forward Shuffle- Two Feet In, Two Feet Out with Agility Ladder  - Sit to Stand with Arm Reach and Jump  - 3 x weekly - 3 sets - 10 reps - Side Lunge with Counter Support  - 3 x weekly - 2 sets - 20 reps - Standing  Alternating Partial Lunge  - 3 x weekly - 2 sets - 10-20 reps  [email or web address] [id number]  [[id number]]   ANKLE CIRCLES -  Repeat 20 Times, Hold 1 Second(s), Complete 3 Sets, Perform 1 Times a Day     ASSESSMENT:   CLINICAL IMPRESSION: R ankle pain and hip pain limiting tolerance for standing and walking dynamic balance exercises. Pt requiring seated rest breaks throughout due to ankle pain, and upwards to minA with standing balance exercises with foursquare balance and unstable surfaces. Agree with primary PT that R ankle pain is limiting full participation in improving pt's balance rehab with difficulty with standing and gait exercises due to R ankle. Pt encouraged  to f/u again with PCP for referral for R ankle assessment. Next session pt will require progress note to assess progress note.     Patient is a 87 y.o. male referred to outpatient physical therapy with a medical diagnosis of other polyneuropathy, acute stroke due to ischemia, double vision who presents with signs and symptoms consistent with imbalance, difficulty with motor control and ambulation consistent with peripheral neuropathy and possible contribution from history of cervical myelopathy. Patient is negative for concordant symptoms with lumbar AROM and lower extremity neurodynamic testing, significantly decreasing likelihood that primary symptoms are from lumbar radiculopathy or sciatica. Patient presents with significant balance, sensory, motor control, postural control, pain, ROM, joint stiffness, muscle performance (strength/power/endurance), knowledge, and activity tolerance impairments that are limiting ability to complete his usual activities such as balance, household and community mobility, ADLs, transfers, and usual mobility without falling, sleeping without difficulty. Patient will benefit from skilled physical therapy intervention to address current body structure impairments and activity limitations to improve function and work towards goals set in current POC in order to return to prior level of function or maximal functional improvement.      OBJECTIVE IMPAIRMENTS Abnormal gait, decreased activity tolerance, decreased balance, decreased coordination, decreased endurance, decreased knowledge of condition, decreased mobility, difficulty walking, decreased ROM, decreased strength, increased edema, impaired sensation, and pain.    ACTIVITY LIMITATIONS carrying, lifting, bending, sitting, standing, squatting, sleeping, stairs, transfers, bed mobility, dressing, and locomotion level   PARTICIPATION LIMITATIONS: shopping, community activity, yard work, and   Insurance underwriter, household and community  mobility, ADLs, transfers, and usual mobility without falling, sleeping.   PERSONAL FACTORS Age, Past/current experiences, Time since onset of injury/illness/exacerbation, and 3+ comorbidities:   acute CVA on 06/20/2022 (MRI showed punctate acute/subacute nonhemorrhagic infarct in the posterior left lower midbrain near the 4th cranial nerve nucleus with remote lacunar infarcts of the right thalamus and right cerebellum),  19m PCOM aneurysm (found incidentally on CTA 06/20/2022, currently undergoing conservative monitoring), concern over dilantin related neuropathy, systolic murmur, benign prostatic hyperplasia with urinary obstruction (followed by urology), chronic R ankle pain (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    REHAB POTENTIAL: Fair patient previously exhausted improvements in PT and has continued doing HEP regularly since then. Underlying cause of issue (neuropathy) unlikely to be able to be reversed by PT, remote lacunar infarcts on cerebellum found on recent brain MRI suggest chronic deficits in motor control behind ataxic symtptoms.    CLINICAL DECISION MAKING: Stable/uncomplicated  EVALUATION COMPLEXITY: Low     GOALS: Goals reviewed with patient? No   SHORT TERM GOALS: Target date: 06/24/2022   Patient will be independent with initial home exercise program for self-management of symptoms. Baseline: Initial HEP to be provided at visit 2 as appropriate (06/10/22); initial HEP provided at visit 2 (07/16/2022); Goal status: in-progress     LONG TERM GOALS: Target date: 09/02/2022    Patient will be independent with a long-term home exercise program for self-management of symptoms.  Baseline: Initial HEP to be provided at visit 2 as appropriate (06/10/22); initial HEP provided at visit 2 (07/16/2022); Goal status: In-progress   2.  Patient will demonstrate improved FOTO by equal or greater than 10 points to demonstrate improvement in overall condition and self-reported functional ability.  Baseline: to be measured at visit 2 as appropriate (06/10/22); 51 at visit 2 (06/16/2022); 51 at visit 3 (06/30/2022);  Goal status: In-progress   3.  Patient will score equal or greater than 25/30 on Functional Gait Assessment to demonstrate low fall risk.  Baseline: to be tested at visit 2 as appropriate (06/10/22); 13/30 high fall risk (06/16/2022); 15/30 high fall risk (07/02/2022);  Goal status: In-progress   4.  Patient will balance in tandem stance equal or greater than 15 seconds with each foot front to show improved static balance and decreased fall risk.  Baseline: R front = 5 seconds, L front = <1 second (06/10/22); R front = 5 seconds, L front = <1 second (06/30/2022);  Goal status: In progress   5.  Patient will complete community, work and/or recreational activities without limitation due to current condition.  Baseline: balance, household and community mobility, ADLs, transfers, and usual mobility without falling, sleeping (06/10/22); Goal status: In-progress       PLAN: PT FREQUENCY: 1-2x/week   PT DURATION: 12 weeks   PLANNED INTERVENTIONS: Therapeutic exercises, Therapeutic activity, Neuromuscular re-education, Balance training, Gait training, Patient/Family education, Joint mobilization, Stair training, DME instructions, Dry Needling, Electrical stimulation, Spinal mobilization, Cryotherapy, Moist heat, Manual therapy, and Re-evaluation.   PLAN FOR NEXT SESSION: FGA, balance exercises, update HEP as appropriate.     Salem Caster. Fairly IV, PT, DPT Physical  Therapist- [facility]  07/21/2022, 12:20 PM   [facility] [address] [place], [place] [address] P: [phone] I F: [phone]

## 2022-07-22 ENCOUNTER — Ambulatory Visit: Payer: Medicare Other | Admitting: Physical Therapy

## 2022-07-22 ENCOUNTER — Telehealth: Payer: Self-pay | Admitting: Family Medicine

## 2022-07-22 ENCOUNTER — Encounter: Payer: Self-pay | Admitting: Physical Therapy

## 2022-07-22 DIAGNOSIS — R208 Other disturbances of skin sensation: Secondary | ICD-10-CM

## 2022-07-22 DIAGNOSIS — R2681 Unsteadiness on feet: Secondary | ICD-10-CM

## 2022-07-22 DIAGNOSIS — R262 Difficulty in walking, not elsewhere classified: Secondary | ICD-10-CM | POA: Diagnosis not present

## 2022-07-22 NOTE — Telephone Encounter (Signed)
Sarah snyder with [facility] called and said that shes been working with patient to improve balance and she said hes having a lot of problems with his right ankle, she said its making him colapse and makes it really hard to complete therapy. She asked if it could be further evaluated. Call back is [phone]

## 2022-07-22 NOTE — Telephone Encounter (Signed)
Also, see 07/16/22 Pt Msg.

## 2022-07-22 NOTE — Therapy (Signed)
OUTPATIENT PHYSICAL THERAPY TREATMENT NOTE / PROGRESS NOTE Dates of reporting: 06/10/2022 to 07/22/2022   Patient Name: Mark Holmes MRN: [id number] DOB:1935-10-27, 87 y.o., male Today's Date: 07/22/2022  PCP: Ria Bush, MD REFERRING PROVIDER: Ria Bush, MD  END OF SESSION:   PT End of Session - 07/22/22 1205     Visit Number 10    Number of Visits 24    Date for PT Re-Evaluation 09/02/22    Authorization Type [employer] MEDICARE reporting period from 06/10/2022    Progress Note Due on Visit 10    PT Start Time 1118    PT Stop Time 1200    PT Time Calculation (min) 42 min    Activity Tolerance Patient tolerated treatment well    Behavior During Therapy [facility] for tasks assessed/performed               Past Medical History:  Diagnosis Date   Benign prostatic hypertrophy with elevated PSA    per Dr. Jacqlyn Larsen   History of CT scan of brain 1992   normal   Hyperglycemia 01/2006   102   Hyperlipemia    Hypertension    Seizure disorder [facility])    Past Surgical History:  Procedure Laterality Date   ANTERIOR CERVICAL DECOMP/DISCECTOMY FUSION  05/2018   for myelopathy -C3/4 ([place] [facility])   CARPAL TUNNEL RELEASE Right 06/2019   ([place])   CATARACT EXTRACTION W/ INTRAOCULAR LENS IMPLANT Right 2012   [place] [facility]   CATARACT EXTRACTION W/PHACO Left 01/14/2022   Procedure: CATARACT EXTRACTION PHACO AND INTRAOCULAR LENS PLACEMENT ([place]) LEFT;  Surgeon: Leandrew Koyanagi, MD;  Location: [place];  Service: Ophthalmology;  Laterality: Left;  7.55 01:03.6   History of EEG  1985   normal   hospitalization  1981   observation for seizuare   TONSILLECTOMY     TRANSURETHRAL RESECTION OF PROSTATE  10/2005   [facility]   TRANSURETHRAL RESECTION OF PROSTATE  08/2017   (rpt) [place]   Patient Active Problem List   Diagnosis Date Noted   Bilateral hearing loss 06/30/2022   Brain aneurysm 06/30/2022   Acute stroke due to ischemia ([place]) 06/20/2022   Nephrotic  range proteinuria 06/04/2022   Hypoproteinemia ([place]) 06/01/2022   Atypical chest pain 07/10/2020   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   Unsteadiness on feet 11/08/2019   History of spinal surgery 01/17/2019   Neck pain 01/17/2019   Weakness of hand 01/17/2019   BPH (benign prostatic hyperplasia) 17/49/4496   Systolic murmur 75/91/6384   Right carpal tunnel syndrome 02/02/2018   Ulnar neuropathy at elbow, left 02/02/2018   Peripheral neuropathy 01/26/2018   Bilateral buttock pain 06/29/2016   Failed vision screen 06/29/2016   Advanced care planning/counseling discussion 06/21/2015   Medicare annual wellness visit, subsequent 06/18/2014   Health maintenance examination 06/18/2014   Chronic ankle pain 06/18/2014   Incomplete emptying of bladder 06/07/2013   Vitamin D deficiency 05/20/2013   THYROID NODULE 05/17/2007   Mixed hyperlipidemia 05/17/2007   Essential tremor 05/17/2007   Essential hypertension 05/17/2007   Cervical spondylosis with myelopathy and radiculopathy 05/17/2007   Seizure disorder ([place]) 05/17/2007    REFERRING DIAG: other polyneuropathy, acute stroke due to ischemia, double vision  THERAPY DIAG:  Difficulty in walking, not elsewhere classified  Unsteadiness on feet  Other disturbances of skin sensation  Rationale for Evaluation and Treatment: Rehabilitation  ONSET DATE: polyneuropathy chronic, CVA/Double vision 06/20/2022  PERTINENT HISTORY: Patient is a 87 y.o. male who presents to outpatient physical  therapy with a referral for medical diagnosis other polyneuropathy, acute stroke due to ischemia, double vision. This patient's chief complaints consist of altered sensation, feeling of tightness in bilateral LE below the knees, R ankle pain, low back pain, and difficulty hearing out of the right ear leading to the following functional deficits: difficulty with balance, household and community mobility, ADLs, transfers, and usual mobility without falling, sleeping.  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    PRECAUTIONS: Fall  SUBJECTIVE: Pt arrived with SPC. Patient reports he is getting really disappointed in his right ankle. His R hip was hurting him yesterday at the end of PT session but is not hurting now. He denies falls since last PT session and he reports doing his HEP. He had a good visit with his Neurologist who took him off of Gabapentin. He is currently weaning off of it. He educated him on pitting edema. He is planning an EEG on him. He is planning to change his medication from dilantin to something else. He has started taking the dog out later in the day. He states his head was feeling foggy while on Gabapentin.   PAIN:  Are you having pain? no  OBJECTIVE  SELF-REPORTED FUNCTION FOTO score: 49/100 (balance questionnaire)  FUNCTIONAL/BALANCE TESTS: -Tandem stance, eyes open: R front = 14 seconds, L front = 3 second.    [facility] 07/22/22 0001       Functional Gait  Assessment   Gait Level Surface Walks 20 ft in less than 7 sec but greater than  5.5 sec, uses assistive device, slower speed, mild gait deviations, or deviates 6-10 in outside of the 12 in walkway width.   self selected pace: 6.92 second; fast: 4.66 seconds   Change in Gait Speed Able to change speed, demonstrates mild gait deviations, deviates 6-10 in outside of the 12 in walkway width, or no gait deviations, unable to achieve a major change in velocity, or uses a change in velocity, or uses an assistive device.    Gait with Horizontal Head Turns Performs head turns smoothly with slight change in gait velocity (eg, minor disruption to smooth gait path), deviates 6-10 in outside 12 in walkway width, or uses an assistive device.    Gait with Vertical Head Turns Performs task with slight change in gait velocity (eg, minor disruption to smooth gait path), deviates 6 - 10 in outside 12 in walkway width or uses assistive device    Gait and Pivot Turn Pivot turns safely in greater than 3 sec and stops with no loss of balance, or pivot turns safely within 3 sec and stops with mild imbalance, requires small steps to catch balance.    Step Over Obstacle Is able to step over one shoe box (4.5  in total height) without changing gait speed. No evidence of imbalance.    Gait with Narrow Base of Support Ambulates less than 4 steps heel to toe or cannot perform without assistance.    Gait with Eyes Closed Cannot walk 20 ft without assistance, severe gait deviations or imbalance, deviates greater than 15 in outside 12 in walkway width or will not attempt task.    Ambulating Backwards Walks 20 ft, uses assistive device, slower speed, mild gait deviations, deviates 6-10 in outside 12 in walkway width.    Steps Alternating feet, must use rail.    Total Score 16    FGA comment: < 19 = high risk fall           TODAY'S TREATMENT   Therapeutic exercise: to centralize symptoms and improve ROM, strength, muscular endurance, and activity tolerance required for successful completion of functional  activities.  - sit <> stand with ball toss, 3x10  Neuromuscular Re-education: to improve, balance, postural strength, muscle activation patterns, and stabilization strength required for functional activities: - Functional Gait Assessment to assess progress (see above).  - Static stance in tandem to assess progress: 3 trials R front, 6 trials L foot front.   Pt required multimodal cuing for proper technique and to facilitate improved neuromuscular control, strength, range of motion, and functional ability resulting in improved performance and form.   PATIENT EDUCATION:  Education details: Exercise purpose/form. Self management techniques. Education on diagnosis, prognosis, POC, anatomy and physiology of current condition.    Person educated: Patient Education method: Customer service manager Education comprehension: verbalized understanding, demonstrated understandin, and needs further education     HOME EXERCISE PROGRAM: Access Code: [email or web address] URL: [email or web address] Date: 07/09/2022 Prepared by: Rosita Kea  Exercises - Half Tandem Stance Balance with Head Rotation  - 2-3 x weekly - 1 sets - 20 reps - Standing Heel Raises  - 1 x daily - 3 sets - 10 reps - Standing Gastroc Stretch at Counter  - 1 x daily - 3 reps - 30 seconds hold - Seated Ankle Eversion with Resistance  - 1 x daily - 3 sets - 10 reps - Seated Heel Raise  - 3 sets - 10 reps - Lateral Inside Outside with Agility Ladder  - Forward Shuffle- Two Feet In, Two Feet Out with Agility Ladder  - Sit to Stand with Arm Reach and Jump  - 3 x weekly - 3 sets - 10 reps - Side Lunge with Counter Support  - 3 x weekly - 2 sets - 20 reps - Standing Alternating Partial Lunge  - 3 x weekly - 2 sets - 10-20 reps  [email or web address]  [[email or web address]]   ANKLE CIRCLES -  Repeat 20 Times, Hold 1 Second(s), Complete 3 Sets, Perform 1 Times a Day     ASSESSMENT:   CLINICAL IMPRESSION: Patient has attended 10 physical  therapy sessions since starting current episode of care on 06/10/2022. After 2 physical therapy sessions he suffered a stroke on 06/20/2022 that delayed his participation but did not appear to significantly affect his functional baseline. MRI at that time showed ischemic changes to the right cerebellum that likely contribute to his difficulty with motor control and ataxia. Since returning to physical therapy, patient has demonstrated trend towards improvement in his Functional Gait Assessment (from 13//30 at baseline to 16/30 today) but still falls under high fall risk category (<19/30). Patient has also improved his ability to stand in tandem stance today (R front = 14 seconds, L front =  3 second). His FOTO (Focus on Therapeutic Outcomes) questionnaire for balance has not improved since starting physical therapy as he recalled a specific instance of having trouble with his balance while ambulating recently. Patient's progress has also been limited by apparently worsening right ankle pain and instability that causes his right ankle to give way with weight bearing on that side despite prior extensive attention to that region while in PT last year and with long term HEP that patient kept up with following last physical therapy discharge. At this point, right ankle pain and instability appears to be significantly affecting patient's ability to balance and mobilize without falling. Physical therapist recommends patient follow up with primary care provider for further medical evaluation of his right ankle to assess for any further intervention that may be appropriate to mitigate pain and instability there. Patient is currently working with neurologist to transition away from medications that may be having a negative effect on his motor control and balance. He would benefit from continued physical therapy, plus further medical assessment of the right ankle, to maximize functional mobility and avoid falls and injury. Plan to  continue working on balance and functional mobility as well as right ankle strength and range of motion as tolerated. Patient would benefit from continued management of limiting condition by skilled physical therapist to address remaining impairments and functional limitations to work towards stated goals and return to PLOF or maximal functional independence. Primary care office contacted today by physical therapist via phone to update physician on limitations posed by right ankle pain. Physical therapist spoke to [facility] who relayed the message to Dr. Danise Mina.   Patient is a 87 y.o. male referred to outpatient physical therapy with a medical diagnosis of other polyneuropathy, acute stroke due to ischemia, double vision who presents with signs and symptoms consistent with imbalance, difficulty with motor control and ambulation consistent with peripheral neuropathy and possible contribution from history of cervical myelopathy. Patient is negative for concordant symptoms with lumbar AROM and lower extremity neurodynamic testing, significantly decreasing likelihood that primary symptoms are from lumbar radiculopathy or sciatica. Patient presents with significant balance, sensory, motor control, postural control, pain, ROM, joint stiffness, muscle performance (strength/power/endurance), knowledge, and activity tolerance impairments that are limiting ability to complete his usual activities such as balance, household and community mobility, ADLs, transfers, and usual mobility without falling, sleeping without difficulty. Patient will benefit from skilled physical therapy intervention to address current body structure impairments and activity limitations to improve function and work towards goals set in current POC in order to return to prior level of function or maximal functional improvement.      OBJECTIVE IMPAIRMENTS Abnormal gait, decreased activity tolerance, decreased balance, decreased coordination,  decreased endurance, decreased knowledge of condition, decreased mobility, difficulty walking, decreased ROM, decreased strength, increased edema, impaired sensation, and pain.    ACTIVITY LIMITATIONS carrying, lifting, bending, sitting, standing, squatting, sleeping, stairs, transfers, bed mobility, dressing, and locomotion level   PARTICIPATION LIMITATIONS: shopping, community activity, yard work, and   Insurance underwriter, household and community mobility, ADLs, transfers, and usual mobility without falling, sleeping.   PERSONAL FACTORS Age, Past/current experiences, Time since onset of injury/illness/exacerbation, and 3+ comorbidities:   acute CVA on 06/20/2022 (MRI showed punctate acute/subacute nonhemorrhagic infarct in the posterior left lower midbrain near the 4th cranial nerve nucleus with remote lacunar infarcts of the right thalamus and right cerebellum),  80m PCOM aneurysm (found incidentally on CTA 06/20/2022, currently undergoing conservative monitoring), concern over dilantin related neuropathy, systolic murmur, benign  prostatic hyperplasia with urinary obstruction (followed by urology), chronic R ankle pain (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    REHAB POTENTIAL: Fair patient previously exhausted improvements in PT and has continued doing HEP regularly since then. Underlying cause of issue (neuropathy) unlikely to be able to be reversed by PT, remote lacunar infarcts on cerebellum found on recent brain MRI suggest chronic deficits in motor control behind ataxic symtptoms.    CLINICAL DECISION MAKING:  Stable/uncomplicated   EVALUATION COMPLEXITY: Low     GOALS: Goals reviewed with patient? No   SHORT TERM GOALS: Target date: 06/24/2022   Patient will be independent with initial home exercise program for self-management of symptoms. Baseline: Initial HEP to be provided at visit 2 as appropriate (06/10/22); initial HEP provided at visit 2 (07/16/2022); Goal status: Met     LONG TERM GOALS: Target date: 09/02/2022   Patient will be independent with a long-term home exercise program for self-management of symptoms.  Baseline: Initial HEP to be provided at visit 2 as appropriate (06/10/22); initial HEP provided at visit 2 (07/16/2022); currently participating (07/22/2022);  Goal status: In-progress   2.  Patient will demonstrate improved FOTO by equal or greater than 10 points to demonstrate improvement in overall condition and self-reported functional ability.  Baseline: to be measured at visit 2 as appropriate (06/10/22); 51 at visit 2 (06/16/2022); 51 at visit 3 (06/30/2022); 49 at visit #10 (07/22/2022);  Goal status: In-progress   3.  Patient will score equal or greater than 25/30 on Functional Gait Assessment to demonstrate low fall risk.  Baseline: to be tested at visit 2 as appropriate (06/10/22); 13/30 high fall risk (06/16/2022); 15/30 high fall risk (07/02/2022); 16/30 high fall risk (07/22/2022);  Goal status: In-progress   4.  Patient will balance in tandem stance equal or greater than 15 seconds with each foot front to show improved static balance and decreased fall risk.  Baseline: R front = 5 seconds, L front = <1 second (06/10/22); R front = 5 seconds, L front = <1 second (06/30/2022); R front = 14 seconds, L front = 3 second (07/22/2022);  Goal status: In progress   5.  Patient will complete community, work and/or recreational activities without limitation due to current condition.  Baseline: balance, household and community mobility, ADLs, transfers, and usual mobility without  falling, sleeping (06/10/22); sleeping better, continues to have difficulty with mobility but is using SPC less (07/22/2022);  Goal status: In-progress     PLAN: PT FREQUENCY: 1-2x/week   PT DURATION: 12 weeks   PLANNED INTERVENTIONS: Therapeutic exercises, Therapeutic activity, Neuromuscular re-education, Balance training, Gait training, Patient/Family education, Joint mobilization, Stair training, DME instructions, Dry Needling, Electrical stimulation, Spinal mobilization, Cryotherapy, Moist heat, Manual therapy, and Re-evaluation.   PLAN FOR NEXT SESSION: balance and ankle exercises, update HEP as appropriate.     Everlean Alstrom. Graylon Good, PT, DPT 07/22/22, 2:57 PM  [facility] [address] [place], [place] [address] P: [phone] I F: [phone]

## 2022-07-27 MED ORDER — PHENYTOIN SODIUM EXTENDED 100 MG PO CAPS
200.0000 mg | ORAL_CAPSULE | Freq: Two times a day (BID) | ORAL | 0 refills | Status: DC
Start: 2022-07-27 — End: 2022-10-05

## 2022-07-27 NOTE — Telephone Encounter (Addendum)
Plz call pt - I thought plan was to come off phenytoin and onto different anti seizure med through neurology  I have gone ahead and given approval but want to clarify dilantin (phenytoin) plan with pt first.

## 2022-07-27 NOTE — Addendum Note (Signed)
Addended by: Ria Bush on: 07/27/2022 08:36 AM   Modules accepted: Orders

## 2022-07-27 NOTE — Telephone Encounter (Signed)
Patient notified as instructed by telephone and verbalized understanding. Patient stated that his neurologist is going to put him on something other than Dilantin. Patient stated that he sent an e-mail to his neurologist yesterday. Patient stated that he is going to continue to take phenytoin until the neurologist makes a change. Patient stated that he will let Dr. Danise Mina know when the change has been made and the name of the medication that he is being changed to.

## 2022-07-27 NOTE — Telephone Encounter (Signed)
Replied via mychart message to pt and staff message to PT

## 2022-07-28 ENCOUNTER — Encounter: Payer: Self-pay | Admitting: Physical Therapy

## 2022-07-28 ENCOUNTER — Ambulatory Visit: Payer: Medicare Other | Admitting: Physical Therapy

## 2022-07-28 DIAGNOSIS — R262 Difficulty in walking, not elsewhere classified: Secondary | ICD-10-CM

## 2022-07-28 DIAGNOSIS — R208 Other disturbances of skin sensation: Secondary | ICD-10-CM

## 2022-07-28 DIAGNOSIS — R2681 Unsteadiness on feet: Secondary | ICD-10-CM

## 2022-07-28 NOTE — Therapy (Addendum)
OUTPATIENT PHYSICAL THERAPY TREATMENT NOTE   Patient Name: Mark Holmes MRN: [id number] DOB:07-18-1935, 87 y.o., male Today's Date: 07/28/2022  PCP: Ria Bush, MD REFERRING PROVIDER: Ria Bush, MD  END OF SESSION:   PT End of Session - 07/28/22 0944     Visit Number 11    Number of Visits 24    Date for PT Re-Evaluation 09/02/22    Authorization Type [employer] MEDICARE reporting period from 07/22/2022    Progress Note Due on Visit 20    PT Start Time 0946    PT Stop Time 1025    PT Time Calculation (min) 39 min    Activity Tolerance Patient tolerated treatment well    Behavior During Therapy [facility] for tasks assessed/performed               Past Medical History:  Diagnosis Date   Benign prostatic hypertrophy with elevated PSA    per Dr. Jacqlyn Larsen   History of CT scan of brain 1992   normal   Hyperglycemia 01/2006   102   Hyperlipemia    Hypertension    Seizure disorder [facility])    Past Surgical History:  Procedure Laterality Date   ANTERIOR CERVICAL DECOMP/DISCECTOMY FUSION  05/2018   for myelopathy -C3/4 ([facility])   CARPAL TUNNEL RELEASE Right 06/2019   ([facility])   CATARACT EXTRACTION W/ INTRAOCULAR LENS IMPLANT Right 2012   [facility]   CATARACT EXTRACTION W/PHACO Left 01/14/2022   Procedure: CATARACT EXTRACTION PHACO AND INTRAOCULAR LENS PLACEMENT ([place]) LEFT;  Surgeon: Leandrew Koyanagi, MD;  Location: [facility];  Service: Ophthalmology;  Laterality: Left;  7.55 01:03.6   History of EEG  1985   normal   hospitalization  1981   observation for seizuare   TONSILLECTOMY     TRANSURETHRAL RESECTION OF PROSTATE  10/2005   [facility]   TRANSURETHRAL RESECTION OF PROSTATE  08/2017   (rpt) [facility]   Patient Active Problem List   Diagnosis Date Noted   Bilateral hearing loss 06/30/2022   Brain aneurysm 06/30/2022   Acute stroke due to ischemia ([place]) 06/20/2022   Nephrotic range proteinuria 06/04/2022   Hypoproteinemia ([place])  06/01/2022   Atypical chest pain 07/10/2020   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   Unsteadiness on feet 11/08/2019   History of spinal surgery 01/17/2019   Neck pain 01/17/2019   Weakness of hand 01/17/2019   BPH (benign prostatic hyperplasia) 85/63/1497   Systolic murmur 02/63/7858   Right carpal tunnel syndrome 02/02/2018   Ulnar neuropathy at elbow, left 02/02/2018   Peripheral neuropathy 01/26/2018   Bilateral buttock pain 06/29/2016   Failed vision screen 06/29/2016   Advanced care planning/counseling discussion 06/21/2015   Medicare annual wellness visit, subsequent 06/18/2014   Health maintenance examination 06/18/2014   Chronic ankle pain 06/18/2014   Incomplete emptying of bladder 06/07/2013   Vitamin D deficiency 05/20/2013   THYROID NODULE 05/17/2007   Mixed hyperlipidemia 05/17/2007   Essential tremor 05/17/2007   Essential hypertension 05/17/2007   Cervical spondylosis with myelopathy and radiculopathy 05/17/2007   Seizure disorder ([place]) 05/17/2007    REFERRING DIAG: other polyneuropathy, acute stroke due to ischemia, double vision  THERAPY DIAG:  Difficulty in walking, not elsewhere classified  Unsteadiness on feet  Other disturbances of skin sensation  Rationale for Evaluation and Treatment: Rehabilitation  ONSET DATE: polyneuropathy chronic, CVA/Double vision 06/20/2022  PERTINENT HISTORY: Patient is a 87 y.o. male who presents to outpatient physical therapy with a referral for medical diagnosis other polyneuropathy,  acute stroke due to ischemia, double vision. This patient's chief complaints consist of altered sensation, feeling of tightness in bilateral LE below the knees, R ankle pain, low back pain, and difficulty hearing out of the right ear leading to the following functional deficits: difficulty with balance, household and community mobility, ADLs, transfers, and usual mobility without falling, sleeping. 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    PRECAUTIONS: Fall  SUBJECTIVE: Pt arrived with SPC and aircast on R ankle. He states his ankle is still bothering him. He states he has been off of Gabapentin now for several days and is eager to find out from Dr. SManuella Ghaziwhen to start the new medication. His PCP got back to patient and PT and said he placed a referral to an ortho for his foot. Denies falls since last PT session and pain today.   PAIN:  Are you having pain? no  OBJECTIVE   TODAY'S TREATMENT   Therapeutic exercise: to centralize symptoms and improve ROM, strength, muscular endurance, and activity tolerance required for successful completion of functional activities.  - sit <> stand from 18 inch chair with ball toss, 2x10 with 3kg med ball, 1x5 with 15lb med ball.  - sit to stand then slam 15lb ball in front of body, sit and pick up. 1x5.  - standing modified push ups with hands on TM bar, 1x15 - standing modified mountain climbers, B UE support on TM bar, cuing to keep forefeet on line about 3 feet behind bar. 1x5, 1x10 with cuing to engage abdominals.    Neuromuscular Re-education: to improve, balance, postural strength, muscle activation patterns, and stabilization strength required for functional activities: - SLS on firm surface. 3x30sec/foot, supervision. Reliant on 1-2 finger support throughout BLE'S, SBA.  - CW/CCW four square step overs for stepping strategy and coordination in multi-planar directions: x5/direction. CGA - stepping over 6 inch hurdle arranged in L shape. 1x5 leading right lateral, right front, left back, and left lateral (reports pain in back). 1x5 leading left lateral, left forwards, right back, right latera (great difficulty stepping forwards with left and backwards with R with min A needed and UE support with R ankle instability, and apprehension, also reports back pain). Seated rest afterwards. CGA-minA. - standing trunk rotation with self-selected stance on airex pad, 1x20 with head remaining front, 2x20 with head turning with trunk. Difficulty with R ankle instability interrupting ankle strategy. CGA  Pt required multimodal cuing for proper technique and to facilitate improved neuromuscular control, strength, range of motion, and functional ability resulting in improved performance and form.   PATIENT EDUCATION:  Education details: Exercise purpose/form. Self management techniques. Education on diagnosis, prognosis, POC, anatomy and physiology of current condition.    Person educated: Patient Education method: EDance movement psychotherapist  comprehension: verbalized understanding, demonstrated understandin, and needs further education     HOME EXERCISE PROGRAM: Access Code: [email or web address] URL: [email or web address] Date: 07/09/2022 Prepared by: Rosita Kea  Exercises - Half Tandem Stance Balance with Head Rotation  - 2-3 x weekly - 1 sets - 20 reps - Standing Heel Raises  - 1 x daily - 3 sets - 10 reps - Standing Gastroc Stretch at Counter  - 1 x daily - 3 reps - 30 seconds hold - Seated  Ankle Eversion with Resistance  - 1 x daily - 3 sets - 10 reps - Seated Heel Raise  - 3 sets - 10 reps - Lateral Inside Outside with Agility Ladder  - Forward Shuffle- Two Feet In, Two Feet Out with Agility Ladder  - Sit to Stand with Arm Reach and Jump  - 3 x weekly - 3 sets - 10 reps - Side Lunge with Counter Support  - 3 x weekly - 2 sets - 20 reps - Standing Alternating Partial Lunge  - 3 x weekly - 2 sets - 10-20 reps  [email or web address]  [[id number]]   ANKLE CIRCLES -  Repeat 20 Times, Hold 1 Second(s), Complete 3 Sets, Perform 1 Times a Day     ASSESSMENT:   CLINICAL IMPRESSION: Patient tolerated treatment with some difficulty due to back pain and right ankle pain and instability. Continued working on tolerated balance and functional strength exercises. Patient has referral to [facility] for follow up for his ankle. Patient would benefit from continued management of limiting condition by skilled physical therapist to address remaining impairments and functional limitations to work towards stated goals and return to PLOF or maximal functional independence.   Patient is a 87 y.o. male referred to outpatient physical therapy with a medical diagnosis of other polyneuropathy, acute stroke due to ischemia, double vision who presents with signs and symptoms consistent with imbalance, difficulty with motor control and ambulation consistent with peripheral neuropathy and possible contribution from history of cervical myelopathy. Patient is negative for concordant symptoms with lumbar AROM and lower extremity neurodynamic testing, significantly decreasing likelihood that primary symptoms are from lumbar radiculopathy or sciatica. Patient presents with significant balance, sensory, motor control, postural control, pain, ROM, joint stiffness, muscle performance (strength/power/endurance), knowledge, and activity tolerance impairments that are limiting ability to complete his usual activities such as  balance, household and community mobility, ADLs, transfers, and usual mobility without falling, sleeping without difficulty. Patient will benefit from skilled physical therapy intervention to address current body structure impairments and activity limitations to improve function and work towards goals set in current POC in order to return to prior level of function or maximal functional improvement.      OBJECTIVE IMPAIRMENTS Abnormal gait, decreased activity tolerance, decreased balance, decreased coordination, decreased endurance, decreased knowledge of condition, decreased mobility, difficulty walking, decreased ROM, decreased strength, increased edema, impaired sensation, and pain.    ACTIVITY LIMITATIONS carrying, lifting, bending, sitting, standing, squatting, sleeping, stairs, transfers, bed mobility, dressing, and locomotion level   PARTICIPATION LIMITATIONS: shopping, community activity, yard work, and   Insurance underwriter, household and community mobility, ADLs, transfers, and usual mobility without falling, sleeping.   PERSONAL FACTORS Age, Past/current experiences, Time since onset of injury/illness/exacerbation, and 3+ comorbidities:   acute CVA on 06/20/2022 (MRI showed punctate acute/subacute nonhemorrhagic infarct in the posterior left lower midbrain near the 4th cranial nerve nucleus with remote lacunar infarcts of the right thalamus and right cerebellum),  59m PCOM aneurysm (found incidentally on CTA 06/20/2022, currently undergoing conservative  monitoring), concern over dilantin related neuropathy, systolic murmur, benign prostatic hyperplasia with urinary obstruction (followed by urology), chronic R ankle pain (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    REHAB POTENTIAL: Fair patient previously exhausted improvements in PT and has continued doing HEP regularly since then. Underlying cause of issue (neuropathy) unlikely to be able to be reversed by PT, remote lacunar infarcts on cerebellum found on recent brain MRI suggest chronic deficits in motor control behind ataxic symtptoms.    CLINICAL DECISION MAKING: Stable/uncomplicated   EVALUATION COMPLEXITY: Low     GOALS: Goals reviewed with patient? No   SHORT TERM GOALS: Target date: 06/24/2022   Patient will be independent with initial home exercise program for self-management of symptoms. Baseline: Initial HEP to be provided at visit 2 as appropriate (06/10/22); initial HEP provided at visit 2 (07/16/2022); Goal status: Met     LONG TERM GOALS: Target date: 09/02/2022   Patient will be independent with a long-term home exercise program for self-management of symptoms.  Baseline: Initial HEP to be provided at visit 2 as appropriate (06/10/22); initial HEP provided at visit 2 (07/16/2022); currently participating (07/22/2022);  Goal status: In-progress   2.  Patient will demonstrate improved FOTO by equal or greater than 10 points to demonstrate improvement in overall condition and self-reported functional ability.  Baseline: to be measured at visit 2 as appropriate (06/10/22); 51 at visit 2 (06/16/2022); 51 at visit 3 (06/30/2022); 49 at visit #10 (07/22/2022);  Goal status: In-progress   3.  Patient will score equal or greater than 25/30 on Functional Gait Assessment to demonstrate low fall risk.  Baseline: to be tested at visit 2 as appropriate (06/10/22); 13/30 high fall risk (06/16/2022); 15/30 high fall risk (07/02/2022); 16/30 high fall risk (07/22/2022);  Goal status: In-progress   4.  Patient will balance in tandem stance equal or greater  than 15 seconds with each foot front to show improved static balance and decreased fall risk.  Baseline: R front = 5 seconds, L front = <1 second (06/10/22); R front = 5 seconds, L front = <1 second (06/30/2022); R front = 14 seconds, L front = 3 second (07/22/2022);  Goal status: In progress   5.  Patient will complete community, work and/or recreational activities without limitation due to current condition.  Baseline: balance, household and community mobility, ADLs, transfers, and usual mobility without falling, sleeping (06/10/22); sleeping better, continues to have difficulty with mobility but is using SPC less (07/22/2022);  Goal status: In-progress     PLAN: PT FREQUENCY: 1-2x/week   PT DURATION: 12 weeks   PLANNED INTERVENTIONS: Therapeutic exercises, Therapeutic activity, Neuromuscular re-education, Balance training, Gait training, Patient/Family education, Joint mobilization, Stair training, DME instructions, Dry Needling, Electrical stimulation, Spinal mobilization, Cryotherapy, Moist heat, Manual therapy, and Re-evaluation.   PLAN FOR NEXT SESSION: balance and ankle exercises, update HEP as appropriate.     Everlean Alstrom. Graylon Good, PT, DPT 07/28/22, 10:29 AM  [facility] [address] [place], [place] [address] P: [phone] I F: [phone]

## 2022-07-30 ENCOUNTER — Encounter: Payer: Self-pay | Admitting: Cardiology

## 2022-07-30 ENCOUNTER — Encounter: Payer: Self-pay | Admitting: Physical Therapy

## 2022-07-30 ENCOUNTER — Ambulatory Visit: Payer: Medicare Other | Admitting: Physical Therapy

## 2022-07-30 ENCOUNTER — Ambulatory Visit (INDEPENDENT_AMBULATORY_CARE_PROVIDER_SITE_OTHER): Payer: Medicare Other | Admitting: Cardiology

## 2022-07-30 VITALS — BP 144/78 | HR 88 | Ht 67.0 in | Wt 172.4 lb

## 2022-07-30 DIAGNOSIS — I700 Atherosclerosis of aorta: Secondary | ICD-10-CM

## 2022-07-30 DIAGNOSIS — I10 Essential (primary) hypertension: Secondary | ICD-10-CM

## 2022-07-30 DIAGNOSIS — I2584 Coronary atherosclerosis due to calcified coronary lesion: Secondary | ICD-10-CM

## 2022-07-30 DIAGNOSIS — R2681 Unsteadiness on feet: Secondary | ICD-10-CM

## 2022-07-30 DIAGNOSIS — E782 Mixed hyperlipidemia: Secondary | ICD-10-CM

## 2022-07-30 DIAGNOSIS — I2510 Atherosclerotic heart disease of native coronary artery without angina pectoris: Secondary | ICD-10-CM

## 2022-07-30 DIAGNOSIS — I671 Cerebral aneurysm, nonruptured: Secondary | ICD-10-CM

## 2022-07-30 DIAGNOSIS — R262 Difficulty in walking, not elsewhere classified: Secondary | ICD-10-CM

## 2022-07-30 DIAGNOSIS — I639 Cerebral infarction, unspecified: Secondary | ICD-10-CM | POA: Diagnosis not present

## 2022-07-30 DIAGNOSIS — R609 Edema, unspecified: Secondary | ICD-10-CM

## 2022-07-30 DIAGNOSIS — R208 Other disturbances of skin sensation: Secondary | ICD-10-CM

## 2022-07-30 NOTE — Patient Instructions (Addendum)
Medication Instructions:  No changes at this time.   *If you need a refill on your cardiac medications before your next appointment, please call your pharmacy*   Lab Work: None  If you have labs (blood work) drawn today and your tests are completely normal, you will receive your results only by: [place] (if you have MyChart) OR A paper copy in the mail If you have any lab test that is abnormal or we need to change your treatment, we will call you to review the results.   Testing/Procedures: None   Follow-Up: At [facility], you and your health needs are our priority.  As part of our continuing mission to provide you with exceptional heart care, we have created designated Provider Care Teams.  These Care Teams include your primary Cardiologist (physician) and Advanced Practice Providers (APPs -  Physician Assistants and Nurse Practitioners) who all work together to provide you with the care you need, when you need it.  Your next appointment:   6 month(s)  The format for your next appointment:   In Person  Provider:   Ida Rogue, MD or Gerrie Nordmann, NP {    Important Information About [place]

## 2022-07-30 NOTE — Progress Notes (Signed)
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  Past Medical History    Past Medical History:  Diagnosis Date   Benign prostatic hypertrophy with elevated PSA    per Dr. Jacqlyn Larsen   History of CT scan of brain 1992   normal   Hyperglycemia 01/2006   102   Hyperlipemia    Hypertension    Seizure disorder [facility])    Past Surgical History:  Procedure Laterality Date   ANTERIOR CERVICAL DECOMP/DISCECTOMY FUSION  05/2018   for myelopathy -C3/4 ([facility])   CARPAL TUNNEL RELEASE Right 06/2019   ([facility])   CATARACT EXTRACTION W/ INTRAOCULAR LENS IMPLANT Right 2012   [facility]   CATARACT EXTRACTION W/PHACO Left 01/14/2022   Procedure: CATARACT EXTRACTION PHACO AND INTRAOCULAR LENS PLACEMENT ([place] LEFT;  Surgeon: Leandrew Koyanagi, MD;  Location: [facility];  Service: Ophthalmology;  Laterality: Left;  7.55 01:03.6   History of EEG  1985   normal   hospitalization  1981   observation for seizuare   TONSILLECTOMY     TRANSURETHRAL RESECTION OF PROSTATE  10/2005   [name]   TRANSURETHRAL RESECTION OF PROSTATE  08/2017   (rpt) [facility]    Allergies  Allergies  Allergen Reactions   Dilaudid [Hydromorphone Hcl] Other (See Comments)    hallucinations    History of Present Illness    87 year old male with the above-mentioned past medical history of seizure disorder in the 1980s on Dilantin, 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  With his symptoms he underwent a brain MRI which revealed punctata acute/subacute nonhemorrhagic infarct in the posterior left lower midbrain to the 4th cranial nerve nucleus, remote lunar infarcts of the right thalamus and right cerebellum, and mild atrophy and white matter disease that likely reflects the sequela of chronic microvascular ischemia.  During his hospitalization he was evaluated by neurosurgery for his brainstem stroke.  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  It was decided at that time that there was no interventional needed from neurosurgery and he would be making a referral to the neurovascular specialist for evaluation and stroke treatment was deferred to neurology  On discharge he was placed on a on a cardiac telemetry monitoring device which she wore for 13 days and 17 hours.  It revealed a minimum heart rate of 53 and maximum heart rate of 176 with average heart rate of 78.  Predominant underlying rhythm was sinus rhythm.  He had 1 run of ventricular tachycardia that was 4 beats and 18 supraventricular tachycardia runs that a car the run that was the fastest interval lasting 9 beats rare PACs and rare PVCs without any evidence of atrial fibrillation.  There was some concern with his brainstem stroke if it could have been emboli stroke atrial fibrillation but fortunately his monitor showed no A-fib at this time.  Returns to clinic  today stating that he has been doing fairly well.  He states he is a "complex mass" as per his wife.  He has recently been told by his neurologist that he will come off of Dilantin and will be changed to a new medication.  He also has a follow-up coming up with nephrology as there was increased amount of protein in his urine.  He denies any chest  pain, shortness of breath, or palpitations.  He does note to have some bilateral lower extremity edema and has previously been told by his PCP that is of low protein.  Of note albumin on his CMP while in the hospital was 2.5.  He has been undergoing physical therapy.  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  Home Medications    Current Outpatient Medications  Medication Sig Dispense Refill   Alpha-Lipoic Acid 600 MG TABS Take 1 tablet by mouth daily.     aspirin EC 81 MG tablet Take 1 tablet (81 mg total) by mouth daily. Swallow whole. 30 tablet 12   aspirin-acetaminophen-caffeine (EXCEDRIN MIGRAINE) 341-937-90 MG per tablet Take 1 tablet by mouth 2 (two) times daily as needed.      atorvastatin (LIPITOR) 40 MG tablet Take 1 tablet (40 mg total) by mouth at bedtime. 30 tablet 0   b complex vitamins capsule Take 1 capsule by mouth daily. [facility] 3 in 1     desipramine (NORPRAMIN) 25 MG tablet Take 1 tablet (25 mg total) by mouth at bedtime. 30 tablet 6   finasteride (PROSCAR) 5 MG tablet Take 1 tablet (5 mg total) by mouth daily. 90 tablet 3   phenytoin (DILANTIN) 100 MG ER capsule Take 2 capsules (200 mg total) by mouth 2 (two) times daily. 120 capsule 0   ramipril (ALTACE) 10 MG capsule Take 1 capsule (10 mg total) by mouth daily. 90 capsule 3   terazosin (HYTRIN) 10 MG capsule Take 1 capsule (10 mg total) by mouth daily. 90 capsule 3   cyclobenzaprine (FLEXERIL) 5 MG tablet Take by mouth. (Patient not taking: Reported on 07/30/2022)     gabapentin (NEURONTIN) 300 MG capsule Take 1 capsule (300 mg total) by mouth 2 (two) times daily. (Patient not taking: Reported on 07/30/2022) 180 capsule 3   tiZANidine (ZANAFLEX) 2 MG tablet Take by mouth. (Patient not taking: Reported on 07/30/2022)     No current facility-administered medications for this visit.     Family History     Family History  Problem Relation Age of Onset   Febrile seizures Sister    Cancer Sister        lung/smoker   CAD Neg Hx    He indicated that his mother is deceased. He indicated that his father is deceased. He indicated that both of his sisters are deceased. He indicated that the status of his neg hx is unknown.  Social History    Social History   Socioeconomic History   Marital status: Married    Spouse name: Sport and exercise psychologist   Number of children: 2   Years of education: Not on file   Highest education level: Master's degree (e.g., MA, MS, MEng, MEd, MSW, MBA)  Occupational History   Occupation: [employer] 2001    Comment: Information systems manager  Tobacco Use   Smoking status: Never   Smokeless tobacco: Never  Media planner  Vaping Use: Never used  Substance and Sexual Activity   Alcohol use: No    Alcohol/week: 0.0 standard drinks of alcohol   Drug use: No   Sexual activity: Yes  Other Topics Concern   Not on file  Social History Narrative   Caffeine: 1 cup/day, 2 excedrin/day   Married since 1959   2 daughters   Occupation: retired, was Banker)   Activity: yardwork, stays active with grandson (swimming)   Diet: avoids carbs, good water, fruits/vegetables daily   Social Determinants of Health   Financial Resource Strain: Not on file  Food Insecurity: Not on file  Transportation Needs: Not on file  Physical Activity: Not on file  Stress: Not on file  Social Connections: Not on file  Intimate Partner Violence: Not on file     Review of Systems    General:  No chills, fever, night sweats or weight changes.  Cardiovascular:  No chest pain, dyspnea on exertion, endorses edema, orthopnea, palpitations, paroxysmal nocturnal dyspnea. Dermatological: No rash, lesions/masses Respiratory: No cough, dyspnea Urologic: No hematuria, dysuria Abdominal:   No nausea, vomiting, diarrhea, bright red blood per rectum, melena, or hematemesis Neurologic:  No visual  changes, wkns, changes in mental status. All other systems reviewed and are otherwise negative except as noted above.     Physical Exam    VS:  BP (!) 144/78 (BP Location: Left Arm, Patient Position: Sitting, Cuff Size: Normal)   Pulse 88   Ht '5\' 7"'$  (1.702 m)   Wt 172 lb 6.4 oz (78.2 kg)   SpO2 95%   BMI 27.00 kg/m  , BMI Body mass index is 27 kg/m.     GEN: Well nourished, well developed, in no acute distress. HEENT: normal. Neck: Supple, no JVD, carotid bruits, or masses. Cardiac: RRR, I/VI systolic murmur, without rubs, or gallops. No clubbing, cyanosis, 2+ pitting edema to bilateral lower extremities.  Radials/DP/PT 2+ and equal bilaterally.  Respiratory:  Respirations regular and unlabored, clear to auscultation bilaterally. GI: Soft, nontender, nondistended, BS + x 4. MS: no deformity or atrophy. Skin: warm and dry, no rash. Neuro:  Strength and sensation are intact. Psych: Normal affect.  Accessory Clinical Findings    ECG personally reviewed by me today- sinus/ sinus arrhythmia rate of 88, LVH and left axis deviation nonspecific T wave abnormalities- No acute changes  Lab Results  Component Value Date   WBC 8.6 06/22/2022   HGB 11.2 (L) 06/22/2022   HCT 33.4 (L) 06/22/2022   MCV 87.2 06/22/2022   PLT 257 06/22/2022   Lab Results  Component Value Date   CREATININE 1.13 06/22/2022   BUN 20 06/22/2022   NA 142 06/22/2022   K 4.0 06/22/2022   CL 110 06/22/2022   CO2 28 06/22/2022   Lab Results  Component Value Date   ALT 17 06/21/2022   AST 21 06/21/2022   ALKPHOS 125 06/21/2022   BILITOT 0.4 06/21/2022   Lab Results  Component Value Date   CHOL 163 06/21/2022   HDL 48 06/21/2022   LDLCALC 92 06/21/2022   LDLDIRECT 147.4 02/22/2008   TRIG 114 06/21/2022   CHOLHDL 3.4 06/21/2022    Lab Results  Component Value Date   HGBA1C 5.4 06/21/2022    Assessment & Plan   1.  Acute stroke due to ischemia as patient presented to the emergency department  with onset diplopia and unsteadiness now largely resolved.  He was left on aspirin and Plavix x 3 weeks and  transition to 81 mg aspirin daily.  His Lipitor was increased to 40 mg daily.  He has seen neurology and neurosurgery for his aneurysm.  He was also placed on a ZIO XT monitor that he wore for 13 days and 17 hours which revealed no atrial fibrillation as there was concern for embolic stroke.  2.  Mixed hyperlipidemia with last LDL 23 3 on 06/21/2022.  In light of his CVA his atorvastatin was increased to 40 mg daily.  This is followed by his PCP and his neurologist.  3.  Aortic atherosclerosis mild in the descending aorta, mild to moderate in the aortic arch, total cholesterol goal is less than 150 and LDL less than 70.  Last total cholesterol on 06/21/2022 was 163.  Continue Lipitor 40 mg daily.  4.  Coronary calcification noted on CT more notable in the LAD and RCA.  During his previous hospitalization he did have atypical chest pain.  He is pain-free today.  If he continues to have symptoms resembling angina can consider outpatient cardiac stress testing at that time.  5.  Peripheral edema likely from hyperproteinemia as his last albumin on CMP was 2.5.  He has been advised on conservative therapy of elevating his extremities, foot calf pumps, decreasing his sodium intake, and wearing compression stockings.  He has been having issues wearing his compression stockings as he is up checking his investments an hour before he gets hour and then when he tries to put his compression stockings on they are too tight.  He had previously been advised to get thigh-high stockings versus knee-high stockings.  If he continues with increasing symptoms he may be sent to vascular for venous ablation workup.  Creatinine was stable but he was told that he was losing protein in his urine and large quantities and has a pending nephrology appointment.  6.  Hypertension with blood pressure today 160/76 with recheck of  144/78.  He is to continue on ramipril 10 mg daily and terazosin 10 mg daily  7.  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  He had a 4 mm PCOM aneurysm, he saw neurosurgery who recommended every 6 month monitoring.  8.  Disposition patient to return to clinic to see MD/APP in 6 months or sooner if needed.  Ethyle Tiedt, NP 07/30/2022, 3:22 PM

## 2022-07-30 NOTE — Therapy (Addendum)
OUTPATIENT PHYSICAL THERAPY TREATMENT NOTE    Patient Name: Mark Holmes MRN: [id number] DOB:11-01-1935, 87 y.o., male Today's Date: 07/30/2022  PCP: Ria Bush, MD REFERRING PROVIDER: Ria Bush, MD  END OF SESSION:   PT End of Session - 07/30/22 1055     Visit Number 12    Number of Visits 24    Date for PT Re-Evaluation 09/02/22    Authorization Type [employer] MEDICARE reporting period from 07/22/2022    Progress Note Due on Visit 20    PT Start Time 1040    PT Stop Time 1118    PT Time Calculation (min) 38 min    Activity Tolerance Patient tolerated treatment well    Behavior During Therapy WFL for tasks assessed/performed                Past Medical History:  Diagnosis Date   Benign prostatic hypertrophy with elevated PSA    per Dr. Jacqlyn Larsen   History of CT scan of brain 1992   normal   Hyperglycemia 01/2006   102   Hyperlipemia    Hypertension    Seizure disorder [facility])    Past Surgical History:  Procedure Laterality Date   ANTERIOR CERVICAL DECOMP/DISCECTOMY FUSION  05/2018   for myelopathy -C3/4 ([facility])   CARPAL TUNNEL RELEASE Right 06/2019   ([facility])   CATARACT EXTRACTION W/ INTRAOCULAR LENS IMPLANT Right 2012   [facility]   CATARACT EXTRACTION W/PHACO Left 01/14/2022   Procedure: CATARACT EXTRACTION PHACO AND INTRAOCULAR LENS PLACEMENT ([place]) LEFT;  Surgeon: Leandrew Koyanagi, MD;  Location: [place];  Service: Ophthalmology;  Laterality: Left;  7.55 01:03.6   History of EEG  1985   normal   hospitalization  1981   observation for seizuare   TONSILLECTOMY     TRANSURETHRAL RESECTION OF PROSTATE  10/2005   [facility]   TRANSURETHRAL RESECTION OF PROSTATE  08/2017   (rpt) [facility]   Patient Active Problem List   Diagnosis Date Noted   Bilateral hearing loss 06/30/2022   Brain aneurysm 06/30/2022   Acute stroke due to ischemia ([place]) 06/20/2022   Nephrotic range proteinuria 06/04/2022   Hypoproteinemia ([place])  06/01/2022   Atypical chest pain 07/10/2020   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   Unsteadiness on feet 11/08/2019   History of spinal surgery 01/17/2019   Neck pain 01/17/2019   Weakness of hand 01/17/2019   BPH (benign prostatic hyperplasia) 48/88/9169   Systolic murmur 45/02/8881   Right carpal tunnel syndrome 02/02/2018   Ulnar neuropathy at elbow, left 02/02/2018   Peripheral neuropathy 01/26/2018   Bilateral buttock pain 06/29/2016   Failed vision screen 06/29/2016   Advanced care planning/counseling discussion 06/21/2015   Medicare annual wellness visit, subsequent 06/18/2014   Health maintenance examination 06/18/2014   Chronic ankle pain 06/18/2014   Incomplete emptying of bladder 06/07/2013   Vitamin D deficiency 05/20/2013   THYROID NODULE 05/17/2007   Mixed hyperlipidemia 05/17/2007   Essential tremor 05/17/2007   Essential hypertension 05/17/2007   Cervical spondylosis with myelopathy and radiculopathy 05/17/2007   Seizure disorder ([place]) 05/17/2007    REFERRING DIAG: other polyneuropathy, acute stroke due to ischemia, double vision  THERAPY DIAG:  Difficulty in walking, not elsewhere classified  Unsteadiness on feet  Other disturbances of skin sensation  Rationale for Evaluation and Treatment: Rehabilitation  ONSET DATE: polyneuropathy chronic, CVA/Double vision 06/20/2022  PERTINENT HISTORY: Patient is a 87 y.o. male who presents to outpatient physical therapy with a referral for medical diagnosis  other polyneuropathy, acute stroke due to ischemia, double vision. This patient's chief complaints consist of altered sensation, feeling of tightness in bilateral LE below the knees, R ankle pain, low back pain, and difficulty hearing out of the right ear leading to the following functional deficits: difficulty with balance, household and community mobility, ADLs, transfers, and usual mobility without falling, sleeping. 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    PRECAUTIONS: Fall  SUBJECTIVE: Patient arrives with new brace on right ankle. [facility] gave him three options, wear the brace, do a fusion, or do an ankle replacement.  He wanted to try the brace first. Patient tried to don the brace by watching the video but it feels looser than how it felt after the doctor applied it yesterday. He had soreness in his bilateral anterior elbows after last PT session and he thinks it is from the heavy ball used last visit. [facility]. [facility]   PAIN:  Are you having pain? Soreness in anterior elbows.   OBJECTIVE   TODAY'S TREATMENT   Therapeutic exercise: to centralize symptoms and improve ROM, strength, muscular endurance, and activity tolerance required for successful completion of functional activities.  - reapplication of R ankle brace with education on how to apply it.  - sit <> stand from 17  inch chair with ball toss, 3x10 with 3kg med ball. - standing modified mountain climbers, B UE support on TM bar, cuing to keep forefeet on line about 3 feet behind bar. 1x10 with cuing to engage abdominals.   Neuromuscular Re-education: to improve, balance, postural strength, muscle activation patterns, and stabilization strength required for functional activities: - SLS on firm surface. 3x30sec/foot, supervision. Reliant on 1-2 finger support throughout BLE'S, SBA.  - standing trunk rotation with self-selected stance on airex pad, 3x20 with head turning with trunk. Occasional LOB with need for UE support. SBA- CGA - stepping over 6 inch hurdle arranged in L shape. 1x2 leading left lateral, left forwards, right back, right lateral (great difficulty and requested to stop). Seated rest afterwards. CGA-minA due to imbalance.   Pt required multimodal cuing for proper technique and to facilitate improved neuromuscular control, strength, range of motion, and functional ability resulting in improved performance and form.   PATIENT EDUCATION:  Education details: Exercise purpose/form. Self management techniques. Education on diagnosis, prognosis, POC, anatomy and physiology of current condition.    Person educated: Patient Education method: ECustomer service managerEducation comprehension: verbalized understanding, demonstrated understandin, and needs further education     HOME EXERCISE PROGRAM: Access Code: [email or web address] URL: [email or web address]  Date: 07/09/2022 Prepared by: Rosita Kea  Exercises - Half Tandem Stance Balance with Head Rotation  - 2-3 x weekly - 1 sets - 20 reps - Standing Heel Raises  - 1 x daily - 3 sets - 10 reps - Standing Gastroc Stretch at Counter  - 1 x daily - 3 reps - 30 seconds hold - Seated Ankle Eversion with Resistance  - 1 x daily - 3 sets - 10 reps - Seated Heel Raise  - 3 sets - 10 reps - Lateral Inside Outside with Agility Ladder  - Forward  Shuffle- Two Feet In, Two Feet Out with Agility Ladder  - Sit to Stand with Arm Reach and Jump  - 3 x weekly - 3 sets - 10 reps - Side Lunge with Counter Support  - 3 x weekly - 2 sets - 20 reps - Standing Alternating Partial Lunge  - 3 x weekly - 2 sets - 10-20 reps  [email or web address]  [[email or web address]]   ANKLE CIRCLES -  Repeat 20 Times, Hold 1 Second(s), Complete 3 Sets, Perform 1 Times a Day     ASSESSMENT:   CLINICAL IMPRESSION: Patient arrives with new ankle brace with ankle lock straps. Patient educated again on how to apply and brace worn throughout session. Patient had less instances if R ankle collapsing but he continued to have pain and instability with R LE stance. Patient would benefit from continued management of limiting condition by skilled physical therapist to address remaining impairments and functional limitations to work towards stated goals and return to PLOF or maximal functional independence.   Patient is a 87 y.o. male referred to outpatient physical therapy with a medical diagnosis of other polyneuropathy, acute stroke due to ischemia, double vision who presents with signs and symptoms consistent with imbalance, difficulty with motor control and ambulation consistent with peripheral neuropathy and possible contribution from history of cervical myelopathy. Patient is negative for concordant symptoms with lumbar AROM and lower extremity neurodynamic testing, significantly decreasing likelihood that primary symptoms are from lumbar radiculopathy or sciatica. Patient presents with significant balance, sensory, motor control, postural control, pain, ROM, joint stiffness, muscle performance (strength/power/endurance), knowledge, and activity tolerance impairments that are limiting ability to complete his usual activities such as balance, household and community mobility, ADLs, transfers, and usual mobility without falling, sleeping without difficulty. Patient will benefit from skilled  physical therapy intervention to address current body structure impairments and activity limitations to improve function and work towards goals set in current POC in order to return to prior level of function or maximal functional improvement.      OBJECTIVE IMPAIRMENTS Abnormal gait, decreased activity tolerance, decreased balance, decreased coordination, decreased endurance, decreased knowledge of condition, decreased mobility, difficulty walking, decreased ROM, decreased strength, increased edema, impaired sensation, and pain.    ACTIVITY LIMITATIONS carrying, lifting, bending, sitting, standing, squatting, sleeping, stairs, transfers, bed mobility, dressing, and locomotion level   PARTICIPATION LIMITATIONS: shopping, community activity, yard work, and   Insurance underwriter, household and community mobility, ADLs, transfers, and usual mobility without falling, sleeping.   PERSONAL FACTORS Age, Past/current experiences, Time since onset of injury/illness/exacerbation, and 3+ comorbidities:   acute CVA on 06/20/2022 (MRI showed punctate acute/subacute nonhemorrhagic infarct in the posterior left lower midbrain near the 4th cranial nerve nucleus with remote lacunar infarcts of the right thalamus and right cerebellum),  16m PCOM aneurysm (found incidentally on CTA 06/20/2022, currently undergoing conservative monitoring), concern over dilantin related neuropathy, systolic murmur, benign prostatic hyperplasia with urinary obstruction (followed  by urology), chronic R ankle pain (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     REHAB POTENTIAL: Fair patient previously exhausted improvements in PT and has continued doing HEP regularly since then. Underlying cause of issue (neuropathy) unlikely to be able to be reversed by PT, remote lacunar infarcts on cerebellum found on recent brain MRI suggest chronic deficits in motor control behind ataxic symtptoms.    CLINICAL DECISION MAKING: Stable/uncomplicated   EVALUATION COMPLEXITY: Low     GOALS: Goals reviewed with patient? No   SHORT TERM GOALS: Target date: 06/24/2022   Patient will be independent with initial home exercise program for self-management of symptoms. Baseline: Initial HEP to be provided at visit 2 as appropriate (06/10/22); initial HEP provided at visit 2 (07/16/2022); Goal status: Met     LONG TERM GOALS: Target date: 09/02/2022   Patient will be independent with a long-term home exercise program for self-management of symptoms.  Baseline: Initial HEP to be provided at visit 2 as appropriate (06/10/22); initial HEP provided at visit 2 (07/16/2022); currently participating (07/22/2022);  Goal status: In-progress   2.  Patient will demonstrate improved FOTO by equal or greater than 10 points to demonstrate improvement in overall condition and self-reported functional ability.  Baseline: to be measured at visit 2 as appropriate (06/10/22); 51 at visit 2 (06/16/2022); 51 at visit 3 (06/30/2022); 49 at visit #10 (07/22/2022);  Goal status: In-progress   3.  Patient will score equal or greater than 25/30 on Functional Gait Assessment to demonstrate low fall risk.  Baseline: to be tested at visit 2 as appropriate (06/10/22); 13/30 high fall risk (06/16/2022); 15/30 high fall risk (07/02/2022); 16/30 high fall risk (07/22/2022);  Goal status: In-progress   4.  Patient will balance in tandem stance equal or greater than 15 seconds with each foot front to show improved static balance and decreased fall risk.  Baseline: R front = 5 seconds, L front = <1 second  (06/10/22); R front = 5 seconds, L front = <1 second (06/30/2022); R front = 14 seconds, L front = 3 second (07/22/2022);  Goal status: In progress   5.  Patient will complete community, work and/or recreational activities without limitation due to current condition.  Baseline: balance, household and community mobility, ADLs, transfers, and usual mobility without falling, sleeping (06/10/22); sleeping better, continues to have difficulty with mobility but is using SPC less (07/22/2022);  Goal status: In-progress     PLAN: PT FREQUENCY: 1-2x/week   PT DURATION: 12 weeks   PLANNED INTERVENTIONS: Therapeutic exercises, Therapeutic activity, Neuromuscular re-education, Balance training, Gait training, Patient/Family education, Joint mobilization, Stair training, DME instructions, Dry Needling, Electrical stimulation, Spinal mobilization, Cryotherapy, Moist heat, Manual therapy, and Re-evaluation.   PLAN FOR NEXT SESSION: balance and ankle exercises, update HEP as appropriate.     Everlean Alstrom. Graylon Good, PT, DPT 07/30/22, 11:19 AM  [facility] [address] [place], [place] [address] P: [phone] I F: [phone]

## 2022-07-31 ENCOUNTER — Encounter: Payer: Self-pay | Admitting: *Deleted

## 2022-08-04 ENCOUNTER — Ambulatory Visit: Payer: Medicare Other | Admitting: Physical Therapy

## 2022-08-04 ENCOUNTER — Encounter: Payer: Self-pay | Admitting: Physical Therapy

## 2022-08-04 DIAGNOSIS — R208 Other disturbances of skin sensation: Secondary | ICD-10-CM

## 2022-08-04 DIAGNOSIS — R262 Difficulty in walking, not elsewhere classified: Secondary | ICD-10-CM | POA: Diagnosis not present

## 2022-08-04 DIAGNOSIS — R2681 Unsteadiness on feet: Secondary | ICD-10-CM

## 2022-08-04 NOTE — Therapy (Signed)
OUTPATIENT PHYSICAL THERAPY TREATMENT NOTE   Patient Name: Mark Holmes MRN: [id number] DOB:16-Nov-1935, 87 y.o., male Today's Date: 08/04/2022  PCP: Ria Bush, MD REFERRING PROVIDER: Ria Bush, MD  END OF SESSION:   PT End of Session - 08/04/22 1050     Visit Number 13    Number of Visits 24    Date for PT Re-Evaluation 09/02/22    Authorization Type [employer] MEDICARE reporting period from 07/22/2022    Progress Note Due on Visit 20    PT Start Time 0945    PT Stop Time 1028    PT Time Calculation (min) 43 min    Equipment Utilized During Treatment Gait belt    Activity Tolerance Patient tolerated treatment well    Behavior During Therapy WFL for tasks assessed/performed             Past Medical History:  Diagnosis Date   Benign prostatic hypertrophy with elevated PSA    per Dr. Jacqlyn Larsen   History of CT scan of brain 1992   normal   Hyperglycemia 01/2006   102   Hyperlipemia    Hypertension    Seizure disorder [facility])    Past Surgical History:  Procedure Laterality Date   ANTERIOR CERVICAL DECOMP/DISCECTOMY FUSION  05/2018   for myelopathy -C3/4 ([place] [facility])   CARPAL TUNNEL RELEASE Right 06/2019   ([place])   CATARACT EXTRACTION W/ INTRAOCULAR LENS IMPLANT Right 2012   [facility]   CATARACT EXTRACTION W/PHACO Left 01/14/2022   Procedure: CATARACT EXTRACTION PHACO AND INTRAOCULAR LENS PLACEMENT ([place]) LEFT;  Surgeon: Leandrew Koyanagi, MD;  Location: [place];  Service: Ophthalmology;  Laterality: Left;  7.55 01:03.6   History of EEG  1985   normal   hospitalization  1981   observation for seizuare   TONSILLECTOMY     TRANSURETHRAL RESECTION OF PROSTATE  10/2005   [facility]   TRANSURETHRAL RESECTION OF PROSTATE  08/2017   (rpt) [facility]   Patient Active Problem List   Diagnosis Date Noted   Bilateral hearing loss 06/30/2022   Brain aneurysm 06/30/2022   Acute stroke due to ischemia ([place]) 06/20/2022   Nephrotic range  proteinuria 06/04/2022   Hypoproteinemia ([place]) 06/01/2022   Atypical chest pain 07/10/2020   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   Unsteadiness on feet 11/08/2019   History of spinal surgery 01/17/2019   Neck pain 01/17/2019   Weakness of hand 01/17/2019   BPH (benign prostatic hyperplasia) 22/48/2500   Systolic murmur 37/03/8888   Right carpal tunnel syndrome 02/02/2018   Ulnar neuropathy at elbow, left 02/02/2018   Peripheral neuropathy 01/26/2018   Bilateral buttock pain 06/29/2016   Failed vision screen 06/29/2016   Advanced care planning/counseling discussion 06/21/2015   Medicare annual wellness visit, subsequent 06/18/2014   Health maintenance examination 06/18/2014   Chronic ankle pain 06/18/2014   Incomplete emptying of bladder 06/07/2013   Vitamin D deficiency 05/20/2013   THYROID NODULE 05/17/2007   Mixed hyperlipidemia 05/17/2007   Essential tremor 05/17/2007   Essential hypertension 05/17/2007   Cervical spondylosis with myelopathy and radiculopathy 05/17/2007   Seizure disorder ([place]) 05/17/2007    REFERRING DIAG: other polyneuropathy, acute stroke due to ischemia, double vision  THERAPY DIAG:  Difficulty in walking, not elsewhere classified  Unsteadiness on feet  Other disturbances of skin sensation  Rationale for Evaluation and Treatment: Rehabilitation  ONSET DATE: polyneuropathy chronic, CVA/Double vision 06/20/2022  PERTINENT HISTORY: Patient is a 87 y.o. male who presents to outpatient physical therapy with  a referral for medical diagnosis other polyneuropathy, acute stroke due to ischemia, double vision. This patient's chief complaints consist of altered sensation, feeling of tightness in bilateral LE below the knees, R ankle pain, low back pain, and difficulty hearing out of the right ear leading to the following functional deficits: difficulty with balance, household and community mobility, ADLs, transfers, and usual mobility without falling, sleeping. 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    PRECAUTIONS: Fall  SUBJECTIVE: [facility]. He states he is feeling okay today. He feels a little sluggish. He denies any falls since last PT session. He states he likes his new ankle brace. He is now off gabapentin. He is using a roll on medication possibly called "new nerve" which helps his neuropathy.   PAIN:  Are you having pain? No  OBJECTIVE   TODAY'S TREATMENT   Therapeutic exercise: to centralize symptoms and improve ROM, strength, muscular endurance, and activity tolerance required for successful completion of functional activities.  - sit <> stand from 17 inch chair to forwards ball slam, ball pick up to stand, then sit. 15 slam ball, 2x10  - sit <> stand from 15 chair with 3kg ball toss, 1x10 - standing modified mountain climbers, B UE support on TM bar, cuing to keep forefeet on line about 3 feet behind bar. 3x10 with cuing to engage abdominals. 2# AW on each LE.   Neuromuscular Re-education: to improve, balance, postural strength,  muscle activation patterns, and stabilization strength required for functional activities: - side stepping on 5 foot arex beam, 3x30 times each direction, CGA with TM bar available in front for UE support as needed. Wide stance noted.  - standing trunk rotation holding 3kg med ball with self-selected stance on airex pad, 3x20 with head turning with trunk. Occasional LOB with need for UE support. SBA- CGA  Pt required multimodal cuing for proper technique and to facilitate improved neuromuscular control, strength, range of motion, and functional ability resulting in improved performance and form.   PATIENT EDUCATION:  Education details: Exercise purpose/form. Self management techniques. Education on diagnosis, prognosis, POC, anatomy and physiology of current condition.    Person educated: Patient Education method: ECustomer service managerEducation comprehension: verbalized understanding, demonstrated understandin, and needs further education     HOME EXERCISE PROGRAM: Access Code: [email or web address] URL: [email or web address] Date: 07/09/2022 Prepared by: SRosita Kea Exercises - Half Tandem Stance Balance with Head Rotation  - 2-3 x weekly - 1 sets - 20 reps - Standing Heel Raises  - 1 x daily - 3 sets - 10 reps - Standing Gastroc Stretch at Counter  - 1 x daily - 3 reps - 30 seconds hold - Seated Ankle Eversion with Resistance  - 1 x daily -  3 sets - 10 reps - Seated Heel Raise  - 3 sets - 10 reps - Lateral Inside Outside with Agility Ladder  - Forward Shuffle- Two Feet In, Two Feet Out with Agility Ladder  - Sit to Stand with Arm Reach and Jump  - 3 x weekly - 3 sets - 10 reps - Side Lunge with Counter Support  - 3 x weekly - 2 sets - 20 reps - Standing Alternating Partial Lunge  - 3 x weekly - 2 sets - 10-20 reps  [email or web address]  [[email or web address]]   ANKLE CIRCLES -  Repeat 20 Times, Hold 1 Second(s), Complete 3 Sets, Perform 1 Times a Day     ASSESSMENT:   CLINICAL  IMPRESSION: Patient tolerated treatment well with sufficient rest breaks between exercises. His R ankle gave him some trouble by end of session but overall seemed a bit better than recently. He continues to have unsteadiness on feet with posterior bias at times. Needs SBA-CGA with occasional min A to prevent falls on more difficult exercises. Patient would benefit from continued management of limiting condition by skilled physical therapist to address remaining impairments and functional limitations to work towards stated goals and return to PLOF or maximal functional independence.   Patient is a 87 y.o. male referred to outpatient physical therapy with a medical diagnosis of other polyneuropathy, acute stroke due to ischemia, double vision who presents with signs and symptoms consistent with imbalance, difficulty with motor control and ambulation consistent with peripheral neuropathy and possible contribution from history of cervical myelopathy. Patient is negative for concordant symptoms with lumbar AROM and lower extremity neurodynamic testing, significantly decreasing likelihood that primary symptoms are from lumbar radiculopathy or sciatica. Patient presents with significant balance, sensory, motor control, postural control, pain, ROM, joint stiffness, muscle performance (strength/power/endurance), knowledge, and activity tolerance impairments that are limiting ability to complete his usual activities such as balance, household and community mobility, ADLs, transfers, and usual mobility without falling, sleeping without difficulty. Patient will benefit from skilled physical therapy intervention to address current body structure impairments and activity limitations to improve function and work towards goals set in current POC in order to return to prior level of function or maximal functional improvement.      OBJECTIVE IMPAIRMENTS Abnormal gait, decreased activity tolerance, decreased balance, decreased  coordination, decreased endurance, decreased knowledge of condition, decreased mobility, difficulty walking, decreased ROM, decreased strength, increased edema, impaired sensation, and pain.    ACTIVITY LIMITATIONS carrying, lifting, bending, sitting, standing, squatting, sleeping, stairs, transfers, bed mobility, dressing, and locomotion level   PARTICIPATION LIMITATIONS: shopping, community activity, yard work, and   Insurance underwriter, household and community mobility, ADLs, transfers, and usual mobility without falling, sleeping.   PERSONAL FACTORS Age, Past/current experiences, Time since onset of injury/illness/exacerbation, and 3+ comorbidities:   acute CVA on 06/20/2022 (MRI showed punctate acute/subacute nonhemorrhagic infarct in the posterior left lower midbrain near the 4th cranial nerve nucleus with remote lacunar infarcts of the right thalamus and right cerebellum),  38m PCOM aneurysm (found incidentally on CTA 06/20/2022, currently undergoing conservative monitoring), concern over dilantin related neuropathy, systolic murmur, benign prostatic hyperplasia with urinary obstruction (followed by urology), chronic R ankle pain (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    REHAB POTENTIAL: Fair patient previously exhausted improvements in PT and has continued doing HEP regularly since then. Underlying cause of issue (neuropathy) unlikely to be able to be reversed by PT, remote lacunar infarcts on cerebellum found on recent brain MRI suggest chronic deficits in motor control behind ataxic symtptoms.    CLINICAL  DECISION MAKING: Stable/uncomplicated   EVALUATION COMPLEXITY: Low     GOALS: Goals reviewed with patient? No   SHORT TERM GOALS: Target date: 06/24/2022   Patient will be independent with initial home exercise program for self-management of symptoms. Baseline: Initial HEP to be provided at visit 2 as appropriate (06/10/22); initial HEP provided at visit 2 (07/16/2022); Goal status: Met     LONG TERM GOALS: Target date: 09/02/2022   Patient will be independent with a long-term home exercise program for self-management of symptoms.  Baseline: Initial HEP to be provided at visit 2 as appropriate (06/10/22); initial HEP provided at visit 2 (07/16/2022); currently participating (07/22/2022);  Goal status: In-progress   2.  Patient will demonstrate improved FOTO by equal or greater than 10 points to demonstrate improvement in overall condition and self-reported functional ability.  Baseline: to be measured at visit 2 as appropriate (06/10/22); 51 at visit 2 (06/16/2022); 51 at visit 3 (06/30/2022); 49 at visit #10 (07/22/2022);  Goal status: In-progress   3.  Patient will score equal or greater than 25/30 on Functional Gait Assessment to demonstrate low fall risk.  Baseline: to be tested at visit 2 as appropriate (06/10/22); 13/30 high fall risk (06/16/2022); 15/30 high fall risk (07/02/2022); 16/30 high fall risk (07/22/2022);  Goal status: In-progress   4.  Patient will balance in tandem stance equal or greater than 15 seconds with each foot front to show improved static balance and decreased fall risk.  Baseline: R front = 5 seconds, L front = <1 second (06/10/22); R front = 5 seconds, L front = <1 second (06/30/2022); R front = 14 seconds, L front = 3 second (07/22/2022);  Goal status: In progress   5.  Patient will complete community, work and/or recreational activities without limitation due to current condition.  Baseline: balance, household and community mobility, ADLs, transfers, and usual  mobility without falling, sleeping (06/10/22); sleeping better, continues to have difficulty with mobility but is using SPC less (07/22/2022);  Goal status: In-progress     PLAN: PT FREQUENCY: 1-2x/week   PT DURATION: 12 weeks   PLANNED INTERVENTIONS: Therapeutic exercises, Therapeutic activity, Neuromuscular re-education, Balance training, Gait training, Patient/Family education, Joint mobilization, Stair training, DME instructions, Dry Needling, Electrical stimulation, Spinal mobilization, Cryotherapy, Moist heat, Manual therapy, and Re-evaluation.   PLAN FOR NEXT SESSION: balance and ankle exercises, update HEP as appropriate.     Everlean Alstrom. Graylon Good, PT, DPT 08/04/22, 10:52 AM  [facility] [address] [place], [place] [address] P: [phone] I F: [phone]

## 2022-08-06 ENCOUNTER — Encounter: Payer: Self-pay | Admitting: Physical Therapy

## 2022-08-06 ENCOUNTER — Ambulatory Visit: Payer: Medicare Other | Admitting: Physical Therapy

## 2022-08-06 DIAGNOSIS — R262 Difficulty in walking, not elsewhere classified: Secondary | ICD-10-CM

## 2022-08-06 DIAGNOSIS — R2681 Unsteadiness on feet: Secondary | ICD-10-CM

## 2022-08-06 DIAGNOSIS — R208 Other disturbances of skin sensation: Secondary | ICD-10-CM

## 2022-08-06 NOTE — Therapy (Signed)
OUTPATIENT PHYSICAL THERAPY TREATMENT NOTE   Patient Name: Mark Holmes MRN: [id number] DOB:[birth year], 87 y.o., male Today's Date: 08/06/2022  PCP: Ria Bush, MD REFERRING PROVIDER: Ria Bush, MD  END OF SESSION:   PT End of Session - 08/06/22 1125     Visit Number 14    Number of Visits 24    Date for PT Re-Evaluation 09/02/22    Authorization Type [employer] MEDICARE reporting period from 07/22/2022    Progress Note Due on Visit 20    PT Start Time 1120    PT Stop Time 1200    PT Time Calculation (min) 40 min    Equipment Utilized During Treatment Gait belt    Activity Tolerance Patient tolerated treatment well    Behavior During Therapy WFL for tasks assessed/performed              Past Medical History:  Diagnosis Date   Benign prostatic hypertrophy with elevated PSA    per Dr. Jacqlyn Larsen   History of CT scan of brain 1992   normal   Hyperglycemia 01/2006   102   Hyperlipemia    Hypertension    Seizure disorder [facility])    Past Surgical History:  Procedure Laterality Date   ANTERIOR CERVICAL DECOMP/DISCECTOMY FUSION  05/2018   for myelopathy -C3/4 ([place] [facility])   CARPAL TUNNEL RELEASE Right 06/2019   ([place])   CATARACT EXTRACTION W/ INTRAOCULAR LENS IMPLANT Right 2012   [place] [facility]   CATARACT EXTRACTION W/PHACO Left 01/14/2022   Procedure: CATARACT EXTRACTION PHACO AND INTRAOCULAR LENS PLACEMENT ([place]) LEFT;  Surgeon: Leandrew Koyanagi, MD;  Location: [place];  Service: Ophthalmology;  Laterality: Left;  7.55 01:03.6   History of EEG  1985   normal   hospitalization  1981   observation for seizuare   TONSILLECTOMY     TRANSURETHRAL RESECTION OF PROSTATE  10/2005   [facility]   TRANSURETHRAL RESECTION OF PROSTATE  08/2017   (rpt) [facility]   Patient Active Problem List   Diagnosis Date Noted   Bilateral hearing loss 06/30/2022   Brain aneurysm 06/30/2022   Acute stroke due to ischemia ([place]) 06/20/2022   Nephrotic range  proteinuria 06/04/2022   Hypoproteinemia ([place]) 06/01/2022   Atypical chest pain 07/10/2020   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   Unsteadiness on feet 11/08/2019   History of spinal surgery 01/17/2019   Neck pain 01/17/2019   Weakness of hand 01/17/2019   BPH (benign prostatic hyperplasia) 40/98/1191   Systolic murmur 47/82/9562   Right carpal tunnel syndrome 02/02/2018   Ulnar neuropathy at elbow, left 02/02/2018   Peripheral neuropathy 01/26/2018   Bilateral buttock pain 06/29/2016   Failed vision screen 06/29/2016   Advanced care planning/counseling discussion 06/21/2015   Medicare annual wellness visit, subsequent 06/18/2014   Health maintenance examination 06/18/2014   Chronic ankle pain 06/18/2014   Incomplete emptying of bladder 06/07/2013   Vitamin D deficiency 05/20/2013   THYROID NODULE 05/17/2007   Mixed hyperlipidemia 05/17/2007   Essential tremor 05/17/2007   Essential hypertension 05/17/2007   Cervical spondylosis with myelopathy and radiculopathy 05/17/2007   Seizure disorder ([place]) 05/17/2007    REFERRING DIAG: other polyneuropathy, acute stroke due to ischemia, double vision  THERAPY DIAG:  Difficulty in walking, not elsewhere classified  Unsteadiness on feet  Other disturbances of skin sensation  Rationale for Evaluation and Treatment: Rehabilitation  ONSET DATE: polyneuropathy chronic, CVA/Double vision 06/20/2022  PERTINENT HISTORY: Patient is a 87 y.o. male who presents to outpatient physical therapy  with a referral for medical diagnosis other polyneuropathy, acute stroke due to ischemia, double vision. This patient's chief complaints consist of altered sensation, feeling of tightness in bilateral LE below the knees, R ankle pain, low back pain, and difficulty hearing out of the right ear leading to the following functional deficits: difficulty with balance, household and community mobility, ADLs, transfers, and usual mobility without falling, sleeping. 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    PRECAUTIONS: Fall  SUBJECTIVE: [facility]. He states his arms were a little sore after last PT session but it didn't last long. He had a hearing test yesterday and his word recognition was off. They recommended getting a hearing aide. He also saw the nephrologist who ordered a scan before he sees him again. He has not pain currently.   PAIN:  Are you having pain? No  OBJECTIVE   TODAY'S TREATMENT   Therapeutic exercise: to centralize symptoms and improve ROM, strength, muscular endurance, and activity tolerance required for successful completion of functional activities.  - sit <> stand from 17 inch chair to forwards ball slam, ball pick up to stand, then sit. 15# slam ball, 3x10. - standing modified mountain climbers, B UE support on TM bar, cuing to keep forefeet on line about 3 feet behind bar. 3x10 with cuing to engage abdominals. 2# AW on each LE.   Neuromuscular Re-education: to improve, balance, postural strength, muscle activation  patterns, and stabilization strength required for functional activities: - side stepping on 5 foot arex beam, 3x30 times each direction, CGA with TM bar available in front for UE support as needed (twice). Wide stance noted.  - standing cone tip/right 1x10 each foot with CGA-minA. U UE support when standing on R LE due to instability of R ankle.  - standing trunk rotation holding 3kg med ball with self-selected stance on airex pad. 3x20 each direction.  Occasional LOB with need for UE support. CGA (seated rest after 2nd set).   Pt required multimodal cuing for proper technique and to facilitate improved neuromuscular control, strength, range of motion, and functional ability resulting in improved performance and form.   PATIENT EDUCATION:  Education details: Exercise purpose/form. Self management techniques. Education on diagnosis, prognosis, POC, anatomy and physiology of current condition.    Person educated: Patient Education method: ECustomer service managerEducation comprehension: verbalized understanding, demonstrated understandin, and needs further education     HOME EXERCISE PROGRAM: Access Code: [email or web address] URL: [email or web address] Date: 07/09/2022 Prepared by: SRosita Kea Exercises - Half Tandem Stance Balance with Head Rotation  - 2-3 x weekly - 1 sets - 20 reps - Standing Heel Raises  - 1 x daily - 3 sets - 10 reps - Standing Gastroc Stretch at Counter  -  1 x daily - 3 reps - 30 seconds hold - Seated Ankle Eversion with Resistance  - 1 x daily - 3 sets - 10 reps - Seated Heel Raise  - 3 sets - 10 reps - Lateral Inside Outside with Agility Ladder  - Forward Shuffle- Two Feet In, Two Feet Out with Agility Ladder  - Sit to Stand with Arm Reach and Jump  - 3 x weekly - 3 sets - 10 reps - Side Lunge with Counter Support  - 3 x weekly - 2 sets - 20 reps - Standing Alternating Partial Lunge  - 3 x weekly - 2 sets - 10-20 reps  [email or web address] [id number]  [[id number]]    ANKLE CIRCLES -  Repeat 20 Times, Hold 1 Second(s), Complete 3 Sets, Perform 1 Times a Day     ASSESSMENT:   CLINICAL IMPRESSION: Patient tolerated treatment well and was able to progress to more sit to stands with ball slams. Finds this exercise quite fatiguing but recovers with rest. Session continued focus on functional strength and balance. Patient required cuing and demonstrated difficulty with ataxia, posterior bias, and R ankle pain that disrupts his balance. Patient would benefit from continued management of limiting condition by skilled physical therapist to address remaining impairments and functional limitations to work towards stated goals and return to PLOF or maximal functional independence.   Patient is a 87 y.o. male referred to outpatient physical therapy with a medical diagnosis of other polyneuropathy, acute stroke due to ischemia, double vision who presents with signs and symptoms consistent with imbalance, difficulty with motor control and ambulation consistent with peripheral neuropathy and possible contribution from history of cervical myelopathy. Patient is negative for concordant symptoms with lumbar AROM and lower extremity neurodynamic testing, significantly decreasing likelihood that primary symptoms are from lumbar radiculopathy or sciatica. Patient presents with significant balance, sensory, motor control, postural control, pain, ROM, joint stiffness, muscle performance (strength/power/endurance), knowledge, and activity tolerance impairments that are limiting ability to complete his usual activities such as balance, household and community mobility, ADLs, transfers, and usual mobility without falling, sleeping without difficulty. Patient will benefit from skilled physical therapy intervention to address current body structure impairments and activity limitations to improve function and work towards goals set in current POC in order to return to prior level of function or  maximal functional improvement.      OBJECTIVE IMPAIRMENTS Abnormal gait, decreased activity tolerance, decreased balance, decreased coordination, decreased endurance, decreased knowledge of condition, decreased mobility, difficulty walking, decreased ROM, decreased strength, increased edema, impaired sensation, and pain.    ACTIVITY LIMITATIONS carrying, lifting, bending, sitting, standing, squatting, sleeping, stairs, transfers, bed mobility, dressing, and locomotion level   PARTICIPATION LIMITATIONS: shopping, community activity, yard work, and   Insurance underwriter, household and community mobility, ADLs, transfers, and usual mobility without falling, sleeping.   PERSONAL FACTORS Age, Past/current experiences, Time since onset of injury/illness/exacerbation, and 3+ comorbidities:   acute CVA on 06/20/2022 (MRI showed punctate acute/subacute nonhemorrhagic infarct in the posterior left lower midbrain near the 4th cranial nerve nucleus with remote lacunar infarcts of the right thalamus and right cerebellum),  16m PCOM aneurysm (found incidentally on CTA 06/20/2022, currently undergoing conservative monitoring), concern over dilantin related neuropathy, systolic murmur, benign prostatic hyperplasia with urinary obstruction (followed by urology), chronic R ankle pain (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    REHAB POTENTIAL: Fair patient previously exhausted improvements in PT and has continued doing HEP regularly since then. Underlying cause of issue (neuropathy) unlikely to be able to be reversed by PT, remote  lacunar infarcts on cerebellum found on recent brain MRI suggest chronic deficits in motor control behind ataxic symtptoms.    CLINICAL DECISION MAKING: Stable/uncomplicated   EVALUATION COMPLEXITY: Low     GOALS: Goals reviewed with patient? No   SHORT TERM GOALS: Target date: 06/24/2022   Patient will be independent with initial home exercise program for self-management of symptoms. Baseline: Initial HEP to be provided at visit 2 as appropriate (06/10/22); initial HEP provided at visit 2 (07/16/2022); Goal status: Met     LONG TERM GOALS: Target date: 09/02/2022   Patient will be independent with a long-term home exercise program for self-management of symptoms.  Baseline: Initial HEP to be provided at visit 2 as appropriate (06/10/22); initial HEP provided at visit 2 (07/16/2022); currently participating (07/22/2022);  Goal status: In-progress   2.  Patient will demonstrate improved FOTO by equal or greater than 10 points to demonstrate improvement in overall condition and self-reported functional ability.  Baseline: to be measured at visit 2 as appropriate (06/10/22); 51 at visit 2 (06/16/2022); 51 at visit 3 (06/30/2022); 49 at visit #10 (07/22/2022);  Goal status: In-progress   3.  Patient will score equal or greater than 25/30 on Functional Gait Assessment to demonstrate low fall risk.  Baseline: to be tested at visit 2 as appropriate (06/10/22); 13/30 high fall risk (06/16/2022); 15/30 high fall risk (07/02/2022); 16/30 high fall risk (07/22/2022);  Goal status: In-progress   4.  Patient will balance in tandem stance equal or greater than 15 seconds with each foot front to show improved static balance and decreased fall risk.  Baseline: R front = 5 seconds, L front = <1 second (06/10/22); R front = 5 seconds, L front = <1 second (06/30/2022); R front = 14 seconds, L front = 3 second (07/22/2022);  Goal status: In progress   5.  Patient will complete community, work and/or recreational  activities without limitation due to current condition.  Baseline: balance, household and community mobility, ADLs, transfers, and usual mobility without falling, sleeping (06/10/22); sleeping better, continues to have difficulty with mobility but is using SPC less (07/22/2022);  Goal status: In-progress     PLAN: PT FREQUENCY: 1-2x/week   PT DURATION: 12 weeks   PLANNED INTERVENTIONS: Therapeutic exercises, Therapeutic activity, Neuromuscular re-education, Balance training, Gait training, Patient/Family education, Joint mobilization, Stair training, DME instructions, Dry Needling, Electrical stimulation, Spinal mobilization, Cryotherapy, Moist heat, Manual therapy, and Re-evaluation.   PLAN FOR NEXT SESSION: balance and ankle exercises, update HEP as appropriate.     Everlean Alstrom. Graylon Good, PT, DPT 08/06/22, 12:06 PM  [facility] [address] [place], [place] [address] P: [phone] I F: [phone]

## 2022-08-07 ENCOUNTER — Other Ambulatory Visit: Payer: Self-pay | Admitting: Nephrology

## 2022-08-07 DIAGNOSIS — R809 Proteinuria, unspecified: Secondary | ICD-10-CM

## 2022-08-07 DIAGNOSIS — E1122 Type 2 diabetes mellitus with diabetic chronic kidney disease: Secondary | ICD-10-CM

## 2022-08-07 DIAGNOSIS — R8290 Unspecified abnormal findings in urine: Secondary | ICD-10-CM

## 2022-08-09 ENCOUNTER — Other Ambulatory Visit: Payer: Self-pay | Admitting: Family Medicine

## 2022-08-10 ENCOUNTER — Ambulatory Visit: Payer: Medicare Other | Admitting: Physical Therapy

## 2022-08-10 ENCOUNTER — Encounter: Payer: Self-pay | Admitting: Physical Therapy

## 2022-08-10 DIAGNOSIS — R2681 Unsteadiness on feet: Secondary | ICD-10-CM

## 2022-08-10 DIAGNOSIS — R208 Other disturbances of skin sensation: Secondary | ICD-10-CM

## 2022-08-10 DIAGNOSIS — R262 Difficulty in walking, not elsewhere classified: Secondary | ICD-10-CM

## 2022-08-10 NOTE — Therapy (Signed)
OUTPATIENT PHYSICAL THERAPY TREATMENT NOTE   Patient Name: Mark Holmes MRN: [id number] DOB:05/13/1935, 87 y.o., male Today's Date: 08/10/2022  PCP: Ria Bush, MD REFERRING PROVIDER: Ria Bush, MD  END OF SESSION:   PT End of Session - 08/10/22 0914     Visit Number 15    Number of Visits 24    Date for PT Re-Evaluation 09/02/22    Authorization Type [employer] MEDICARE reporting period from 07/22/2022    Progress Note Due on Visit 20    PT Start Time 0907    PT Stop Time 0952    PT Time Calculation (min) 45 min    Equipment Utilized During Treatment Gait belt    Activity Tolerance Patient tolerated treatment well    Behavior During Therapy WFL for tasks assessed/performed               Past Medical History:  Diagnosis Date   Benign prostatic hypertrophy with elevated PSA    per Dr. Jacqlyn Larsen   History of CT scan of brain 1992   normal   Hyperglycemia 01/2006   102   Hyperlipemia    Hypertension    Seizure disorder [facility])    Past Surgical History:  Procedure Laterality Date   ANTERIOR CERVICAL DECOMP/DISCECTOMY FUSION  05/2018   for myelopathy -C3/4 ([facility])   CARPAL TUNNEL RELEASE Right 06/2019   ([facility])   CATARACT EXTRACTION W/ INTRAOCULAR LENS IMPLANT Right 2012   [facility]   CATARACT EXTRACTION W/PHACO Left 01/14/2022   Procedure: CATARACT EXTRACTION PHACO AND INTRAOCULAR LENS PLACEMENT ([place]) LEFT;  Surgeon: Leandrew Koyanagi, MD;  Location: [facility];  Service: Ophthalmology;  Laterality: Left;  7.55 01:03.6   History of EEG  1985   normal   hospitalization  1981   observation for seizuare   TONSILLECTOMY     TRANSURETHRAL RESECTION OF PROSTATE  10/2005   [facility]   TRANSURETHRAL RESECTION OF PROSTATE  08/2017   (rpt) [facility]   Patient Active Problem List   Diagnosis Date Noted   Bilateral hearing loss 06/30/2022   Brain aneurysm 06/30/2022   Acute stroke due to ischemia ([place]) 06/20/2022   Nephrotic range  proteinuria 06/04/2022   Hypoproteinemia ([place]) 06/01/2022   Atypical chest pain 07/10/2020   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   Unsteadiness on feet 11/08/2019   History of spinal surgery 01/17/2019   Neck pain 01/17/2019   Weakness of hand 01/17/2019   BPH (benign prostatic hyperplasia) 83/66/2947   Systolic murmur 65/46/5035   Right carpal tunnel syndrome 02/02/2018   Ulnar neuropathy at elbow, left 02/02/2018   Peripheral neuropathy 01/26/2018   Bilateral buttock pain 06/29/2016   Failed vision screen 06/29/2016   Advanced care planning/counseling discussion 06/21/2015   Medicare annual wellness visit, subsequent 06/18/2014   Health maintenance examination 06/18/2014   Chronic ankle pain 06/18/2014   Incomplete emptying of bladder 06/07/2013   Vitamin D deficiency 05/20/2013   THYROID NODULE 05/17/2007   Mixed hyperlipidemia 05/17/2007   Essential tremor 05/17/2007   Essential hypertension 05/17/2007   Cervical spondylosis with myelopathy and radiculopathy 05/17/2007   Seizure disorder ([place]) 05/17/2007    REFERRING DIAG: other polyneuropathy, acute stroke due to ischemia, double vision  THERAPY DIAG:  Difficulty in walking, not elsewhere classified  Unsteadiness on feet  Other disturbances of skin sensation  Rationale for Evaluation and Treatment: Rehabilitation  ONSET DATE: polyneuropathy chronic, CVA/Double vision 06/20/2022  PERTINENT HISTORY: Patient is a 87 y.o. male who presents to outpatient physical  therapy with a referral for medical diagnosis other polyneuropathy, acute stroke due to ischemia, double vision. This patient's chief complaints consist of altered sensation, feeling of tightness in bilateral LE below the knees, R ankle pain, low back pain, and difficulty hearing out of the right ear leading to the following functional deficits: difficulty with balance, household and community mobility, ADLs, transfers, and usual mobility without falling, sleeping. 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    PRECAUTIONS: Fall  SUBJECTIVE: [facility]. He state he was tired after last PT session but had not soreness or pain. He was in a pool for two days this weekend and was impressed at how much he could feel his legs below the knees in a way that felt like he had compression stockings on. He states he enjoyed the pool. He denies falls since last PT session.  PAIN:  Are you having pain? No  OBJECTIVE   TODAY'S TREATMENT   Therapeutic exercise: to centralize symptoms and improve ROM, strength, muscular endurance, and activity tolerance required for successful completion of functional activities.  - sit <> stand from 17 inch chair to forwards ball slam, ball pick up to stand, then sit. 15# slam ball, 3x10. - standing modified mountain climbers, B UE support on TM bar, cuing to keep forefeet on line about 3 feet behind bar. 3x12 with cuing to engage abdominals. 3# AW on each LE.   Neuromuscular Re-education: to improve, balance, postural strength,  muscle activation patterns, and stabilization strength required for functional activities: - side stepping on 5 foot arex beam, 2x5 times each direction, CGA with TM bar available in front for UE support as needed (twice). Wide stance and flexed hips noted.  - standing cone tip/right 1x10 each foot with CGA-minA. occasional UE support when standing on R LE due to instability of R ankle.  - standing trunk rotation holding 3kg med ball with self-selected stance on airex pad. 3x20 each direction.  Occasional LOB with need for UE support. CGA   Pt required multimodal cuing for proper technique and to facilitate improved neuromuscular control, strength, range of motion, and functional ability resulting in improved performance and form.   PATIENT EDUCATION:  Education details: Exercise purpose/form. Self management techniques. Education on diagnosis, prognosis, POC, anatomy and physiology of current condition.    Person educated: Patient Education method: ECustomer service managerEducation comprehension: verbalized understanding, demonstrated understandin, and needs further education     HOME EXERCISE PROGRAM: Access Code: [email or web address] URL: [email or web address] Date: 07/09/2022 Prepared by: SRosita Kea Exercises - Half Tandem Stance Balance with Head Rotation  - 2-3 x weekly - 1 sets - 20 reps - Standing Heel Raises  - 1 x daily - 3 sets - 10 reps -  Standing Gastroc Stretch at [employer]  - 1 x daily - 3 reps - 30 seconds hold - Seated Ankle Eversion with Resistance  - 1 x daily - 3 sets - 10 reps - Seated Heel Raise  - 3 sets - 10 reps - Lateral Inside Outside with Agility Ladder  - Forward Shuffle- Two Feet In, Two Feet Out with Agility Ladder  - Sit to Stand with Arm Reach and Jump  - 3 x weekly - 3 sets - 10 reps - Side Lunge with Counter Support  - 3 x weekly - 2 sets - 20 reps - Standing Alternating Partial Lunge  - 3 x weekly - 2 sets - 10-20 reps  [email or web address]   [[id number]]   ANKLE CIRCLES -  Repeat 20 Times, Hold 1 Second(s), Complete 3 Sets, Perform 1 Times a Day     ASSESSMENT:   CLINICAL IMPRESSION: Patient tolerated treatment well with strong fatigue or need for rest after each activity. Continues to have limitations from right ankle pain/instability but this is less of a hindrance to function with R ankle brace donned. Patient continues to demonstrate unsteadiness on feet and uses wide stance and ataxic gait to maintain balance. Patient would benefit from continued management of limiting condition by skilled physical therapist to address remaining impairments and functional limitations to work towards stated goals and return to PLOF or maximal functional independence.   Patient is a 87 y.o. male referred to outpatient physical therapy with a medical diagnosis of other polyneuropathy, acute stroke due to ischemia, double vision who presents with signs and symptoms consistent with imbalance, difficulty with motor control and ambulation consistent with peripheral neuropathy and possible contribution from history of cervical myelopathy. Patient is negative for concordant symptoms with lumbar AROM and lower extremity neurodynamic testing, significantly decreasing likelihood that primary symptoms are from lumbar radiculopathy or sciatica. Patient presents with significant balance, sensory, motor control, postural control, pain, ROM, joint stiffness, muscle performance (strength/power/endurance), knowledge, and activity tolerance impairments that are limiting ability to complete his usual activities such as balance, household and community mobility, ADLs, transfers, and usual mobility without falling, sleeping without difficulty. Patient will benefit from skilled physical therapy intervention to address current body structure impairments and activity limitations to improve function and work towards goals set in current POC in order to return to prior level of  function or maximal functional improvement.      OBJECTIVE IMPAIRMENTS Abnormal gait, decreased activity tolerance, decreased balance, decreased coordination, decreased endurance, decreased knowledge of condition, decreased mobility, difficulty walking, decreased ROM, decreased strength, increased edema, impaired sensation, and pain.    ACTIVITY LIMITATIONS carrying, lifting, bending, sitting, standing, squatting, sleeping, stairs, transfers, bed mobility, dressing, and locomotion level   PARTICIPATION LIMITATIONS: shopping, community activity, yard work, and   Insurance underwriter, household and community mobility, ADLs, transfers, and usual mobility without falling, sleeping.   PERSONAL FACTORS Age, Past/current experiences, Time since onset of injury/illness/exacerbation, and 3+ comorbidities:   acute CVA on 06/20/2022 (MRI showed punctate acute/subacute nonhemorrhagic infarct in the posterior left lower midbrain near the 4th cranial nerve nucleus with remote lacunar infarcts of the right thalamus and right cerebellum),  71m PCOM aneurysm (found incidentally on CTA 06/20/2022, currently undergoing conservative monitoring), concern over dilantin related neuropathy, systolic murmur, benign prostatic hyperplasia with urinary obstruction (followed by urology), chronic R ankle pain (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    REHAB POTENTIAL: Fair patient previously exhausted improvements in PT and has continued doing HEP regularly since then. Underlying cause of issue (neuropathy) unlikely to be able to be reversed by PT,  remote lacunar infarcts on cerebellum found on recent brain MRI suggest chronic deficits in motor control behind ataxic symtptoms.    CLINICAL DECISION MAKING: Stable/uncomplicated   EVALUATION COMPLEXITY: Low     GOALS: Goals reviewed with patient? No   SHORT TERM GOALS: Target date: 06/24/2022   Patient will be independent with initial home exercise program for self-management of symptoms. Baseline: Initial HEP to be provided at visit 2 as appropriate (06/10/22); initial HEP provided at visit 2 (07/16/2022); Goal status: Met     LONG TERM GOALS: Target date: 09/02/2022   Patient will be independent with a long-term home exercise program for self-management of symptoms.  Baseline: Initial HEP to be provided at visit 2 as appropriate (06/10/22); initial HEP provided at visit 2 (07/16/2022); currently participating (07/22/2022);  Goal status: In-progress   2.  Patient will demonstrate improved FOTO by equal or greater than 10 points to demonstrate improvement in overall condition and self-reported functional ability.  Baseline: to be measured at visit 2 as appropriate (06/10/22); 51 at visit 2 (06/16/2022); 51 at visit 3 (06/30/2022); 49 at visit #10 (07/22/2022);  Goal status: In-progress   3.  Patient will score equal or greater than 25/30 on Functional Gait Assessment to demonstrate low fall risk.  Baseline: to be tested at visit 2 as appropriate (06/10/22); 13/30 high fall risk (06/16/2022); 15/30 high fall risk (07/02/2022); 16/30 high fall risk (07/22/2022);  Goal status: In-progress   4.  Patient will balance in tandem stance equal or greater than 15 seconds with each foot front to show improved static balance and decreased fall risk.  Baseline: R front = 5 seconds, L front = <1 second (06/10/22); R front = 5 seconds, L front = <1 second (06/30/2022); R front = 14 seconds, L front = 3 second (07/22/2022);  Goal status: In progress   5.  Patient will complete community, work and/or recreational  activities without limitation due to current condition.  Baseline: balance, household and community mobility, ADLs, transfers, and usual mobility without falling, sleeping (06/10/22); sleeping better, continues to have difficulty with mobility but is using SPC less (07/22/2022);  Goal status: In-progress     PLAN: PT FREQUENCY: 1-2x/week   PT DURATION: 12 weeks   PLANNED INTERVENTIONS: Therapeutic exercises, Therapeutic activity, Neuromuscular re-education, Balance training, Gait training, Patient/Family education, Joint mobilization, Stair training, DME instructions, Dry Needling, Electrical stimulation, Spinal mobilization, Cryotherapy, Moist heat, Manual therapy, and Re-evaluation.   PLAN FOR NEXT SESSION: balance and ankle exercises, update HEP as appropriate.     Everlean Alstrom. Graylon Good, PT, DPT 08/10/22, 10:11 AM  [facility] [address] [place], [place] [address] P: [phone] I F: [phone]

## 2022-08-10 NOTE — Telephone Encounter (Signed)
Refill request Gabapentin Last refill 11/17/21 #180/3 Last office visit 06/29/22

## 2022-08-11 ENCOUNTER — Encounter: Payer: Medicare Other | Admitting: Physical Therapy

## 2022-08-12 ENCOUNTER — Encounter: Payer: Self-pay | Admitting: Physical Therapy

## 2022-08-12 ENCOUNTER — Ambulatory Visit: Payer: Medicare Other | Admitting: Physical Therapy

## 2022-08-12 DIAGNOSIS — R2681 Unsteadiness on feet: Secondary | ICD-10-CM

## 2022-08-12 DIAGNOSIS — R262 Difficulty in walking, not elsewhere classified: Secondary | ICD-10-CM | POA: Diagnosis not present

## 2022-08-12 DIAGNOSIS — M545 Low back pain, unspecified: Secondary | ICD-10-CM

## 2022-08-12 DIAGNOSIS — R208 Other disturbances of skin sensation: Secondary | ICD-10-CM

## 2022-08-12 DIAGNOSIS — Z9181 History of falling: Secondary | ICD-10-CM

## 2022-08-12 NOTE — Therapy (Signed)
OUTPATIENT PHYSICAL THERAPY TREATMENT NOTE   Patient Name: Mark Holmes MRN: [id number] DOB:1935/12/03, 87 y.o., male Today's Date: 08/12/2022  PCP: Ria Bush, MD REFERRING PROVIDER: Ria Bush, MD  END OF SESSION:   PT End of Session - 08/12/22 1443     Visit Number 16    Number of Visits 24    Date for PT Re-Evaluation 09/02/22    Authorization Type [employer] MEDICARE reporting period from 07/22/2022    Progress Note Due on Visit 20    PT Start Time 1435    PT Stop Time 1513    PT Time Calculation (min) 38 min    Equipment Utilized During Treatment Gait belt    Activity Tolerance Patient tolerated treatment well    Behavior During Therapy WFL for tasks assessed/performed                Past Medical History:  Diagnosis Date   Benign prostatic hypertrophy with elevated PSA    per Dr. Jacqlyn Larsen   History of CT scan of brain 1992   normal   Hyperglycemia 01/2006   102   Hyperlipemia    Hypertension    Seizure disorder [facility])    Past Surgical History:  Procedure Laterality Date   ANTERIOR CERVICAL DECOMP/DISCECTOMY FUSION  05/2018   for myelopathy -C3/4 ([place] [facility])   CARPAL TUNNEL RELEASE Right 06/2019   ([place])   CATARACT EXTRACTION W/ INTRAOCULAR LENS IMPLANT Right 2012   [facility]   CATARACT EXTRACTION W/PHACO Left 01/14/2022   Procedure: CATARACT EXTRACTION PHACO AND INTRAOCULAR LENS PLACEMENT ([place]) LEFT;  Surgeon: Leandrew Koyanagi, MD;  Location: [place];  Service: Ophthalmology;  Laterality: Left;  7.55 01:03.6   History of EEG  1985   normal   hospitalization  1981   observation for seizuare   TONSILLECTOMY     TRANSURETHRAL RESECTION OF PROSTATE  10/2005   [facility]   TRANSURETHRAL RESECTION OF PROSTATE  08/2017   (rpt) [facility]   Patient Active Problem List   Diagnosis Date Noted   Bilateral hearing loss 06/30/2022   Brain aneurysm 06/30/2022   Acute stroke due to ischemia ([place]) 06/20/2022   Nephrotic range  proteinuria 06/04/2022   Hypoproteinemia ([place]) 06/01/2022   Atypical chest pain 07/10/2020   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   Unsteadiness on feet 11/08/2019   History of spinal surgery 01/17/2019   Neck pain 01/17/2019   Weakness of hand 01/17/2019   BPH (benign prostatic hyperplasia) 76/54/6503   Systolic murmur 54/65/6812   Right carpal tunnel syndrome 02/02/2018   Ulnar neuropathy at elbow, left 02/02/2018   Peripheral neuropathy 01/26/2018   Bilateral buttock pain 06/29/2016   Failed vision screen 06/29/2016   Advanced care planning/counseling discussion 06/21/2015   Medicare annual wellness visit, subsequent 06/18/2014   Health maintenance examination 06/18/2014   Chronic ankle pain 06/18/2014   Incomplete emptying of bladder 06/07/2013   Vitamin D deficiency 05/20/2013   THYROID NODULE 05/17/2007   Mixed hyperlipidemia 05/17/2007   Essential tremor 05/17/2007   Essential hypertension 05/17/2007   Cervical spondylosis with myelopathy and radiculopathy 05/17/2007   Seizure disorder ([place]) 05/17/2007    REFERRING DIAG: other polyneuropathy, acute stroke due to ischemia, double vision  THERAPY DIAG:  Difficulty in walking, not elsewhere classified  Unsteadiness on feet  Other disturbances of skin sensation  Chronic bilateral low back pain, unspecified whether sciatica present  History of falling  Rationale for Evaluation and Treatment: Rehabilitation  ONSET DATE: polyneuropathy chronic, CVA/Double vision  06/20/2022  PERTINENT HISTORY: Patient is a 87 y.o. male who presents to outpatient physical therapy with a referral for medical diagnosis other polyneuropathy, acute stroke due to ischemia, double vision. This patient's chief complaints consist of altered sensation, feeling of tightness in bilateral LE below the knees, R ankle pain, low back pain, and difficulty hearing out of the right ear leading to the following functional deficits: difficulty with balance, household  and community mobility, ADLs, transfers, and usual mobility without falling, sleeping. 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    PRECAUTIONS: Fall  SUBJECTIVE: [facility]. He denies falls since last PT session or current pain. He states he was tired after last PT session. States he felt better after last PT session compared to following the time before. Still doing some of his HEP.   PAIN:  Are you having pain? No  OBJECTIVE   TODAY'S TREATMENT   Therapeutic exercise: to centralize symptoms and improve ROM, strength, muscular endurance, and activity tolerance required for successful completion of functional activities.  - sit <> stand from 17 inch chair to forwards ball slam, ball pick up to stand, then sit. 15# slam ball, 3x12. Seated rest in between.  - standing modified mountain climbers, B UE support on TM bar, cuing to keep forefeet on line about 3 feet behind bar. 3x15 with cuing to engage abdominals. 3# AW on each LE. (Aerobic capacity limiting).   Neuromuscular  Re-education: to improve, balance, postural strength, muscle activation patterns, and stabilization strength required for functional activities: - side stepping on 5 foot arex beam, 2x5 times each direction, CGA with TM bar available in front for UE support as needed. Wide stance and flexed hips noted. Cuing to not look at feet.  - standing cone tip/right 1x10 each foot with CGA with  UE support when standing on R LE due to instability of R ankle.  - standing trunk rotation holding 8#DB with self-selected stance on airex pad. 3x20 each direction.  Occasional LOB with need for UE support. CGA   Pt required multimodal cuing for proper technique and to facilitate improved neuromuscular control, strength, range of motion, and functional ability resulting in improved performance and form.   PATIENT EDUCATION:  Education details: Exercise purpose/form. Self management techniques. Education on diagnosis, prognosis, POC, anatomy and physiology of current condition.    Person educated: Patient Education method: ECustomer service managerEducation comprehension: verbalized understanding, demonstrated understandin, and needs further education     HOME EXERCISE PROGRAM: Access Code: [email or web address] URL: [email or web address] Date: 07/09/2022 Prepared by: SRosita Kea Exercises - Half Tandem Stance Balance with Head Rotation  - 2-3 x weekly - 1 sets - 20 reps - Standing Heel Raises  - 1 x daily - 3  sets - 10 reps - Standing Gastroc Stretch at Counter  - 1 x daily - 3 reps - 30 seconds hold - Seated Ankle Eversion with Resistance  - 1 x daily - 3 sets - 10 reps - Seated Heel Raise  - 3 sets - 10 reps - Lateral Inside Outside with Agility Ladder  - Forward Shuffle- Two Feet In, Two Feet Out with Agility Ladder  - Sit to Stand with Arm Reach and Jump  - 3 x weekly - 3 sets - 10 reps - Side Lunge with Counter Support  - 3 x weekly - 2 sets - 20 reps - Standing Alternating Partial Lunge  - 3  x weekly - 2 sets - 10-20 reps  [email or web address]  [[id number]]   ANKLE CIRCLES -  Repeat 20 Times, Hold 1 Second(s), Complete 3 Sets, Perform 1 Times a Day     ASSESSMENT:   CLINICAL IMPRESSION: Patient tolerated treatment well with adequate rest. He was able to progress exercises this session with good tolerance. Continues to have difficulty with R ankle pain and instability, but this is better with the brace. He continues to demonstrate unsteadiness on his feet and needs guarding to complete balance exercises completed in clinic safely. Patient would benefit from continued management of limiting condition by skilled physical therapist to address remaining impairments and functional limitations to work towards stated goals and return to PLOF or maximal functional independence.   Patient is a 87 y.o. male referred to outpatient physical therapy with a medical diagnosis of other polyneuropathy, acute stroke due to ischemia, double vision who presents with signs and symptoms consistent with imbalance, difficulty with motor control and ambulation consistent with peripheral neuropathy and possible contribution from history of cervical myelopathy. Patient is negative for concordant symptoms with lumbar AROM and lower extremity neurodynamic testing, significantly decreasing likelihood that primary symptoms are from lumbar radiculopathy or sciatica. Patient presents with significant balance, sensory, motor control, postural control, pain, ROM, joint stiffness, muscle performance (strength/power/endurance), knowledge, and activity tolerance impairments that are limiting ability to complete his usual activities such as balance, household and community mobility, ADLs, transfers, and usual mobility without falling, sleeping without difficulty. Patient will benefit from skilled physical therapy intervention to address current body structure impairments and activity limitations to improve function and work towards  goals set in current POC in order to return to prior level of function or maximal functional improvement.      OBJECTIVE IMPAIRMENTS Abnormal gait, decreased activity tolerance, decreased balance, decreased coordination, decreased endurance, decreased knowledge of condition, decreased mobility, difficulty walking, decreased ROM, decreased strength, increased edema, impaired sensation, and pain.    ACTIVITY LIMITATIONS carrying, lifting, bending, sitting, standing, squatting, sleeping, stairs, transfers, bed mobility, dressing, and locomotion level   PARTICIPATION LIMITATIONS: shopping, community activity, yard work, and   Insurance underwriter, household and community mobility, ADLs, transfers, and usual mobility without falling, sleeping.   PERSONAL FACTORS Age, Past/current experiences, Time since onset of injury/illness/exacerbation, and 3+ comorbidities:   acute CVA on 06/20/2022 (MRI showed punctate acute/subacute nonhemorrhagic infarct in the posterior left lower midbrain near the 4th cranial nerve nucleus with remote lacunar infarcts of the right thalamus and right cerebellum),  56m PCOM aneurysm (found incidentally on CTA 06/20/2022, currently undergoing conservative monitoring), concern over dilantin related neuropathy, systolic murmur, benign prostatic hyperplasia with urinary obstruction (followed by urology), chronic R ankle pain (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    REHAB POTENTIAL: Fair patient previously exhausted improvements in PT and has continued doing HEP regularly since then. Underlying cause  of issue (neuropathy) unlikely to be able to be reversed by PT, remote lacunar infarcts on cerebellum found on recent brain MRI suggest chronic deficits in motor control behind ataxic symtptoms.    CLINICAL DECISION MAKING: Stable/uncomplicated   EVALUATION COMPLEXITY: Low     GOALS: Goals reviewed with patient? No   SHORT TERM GOALS: Target date: 06/24/2022   Patient will be independent with initial home exercise program for self-management of symptoms. Baseline: Initial HEP to be provided at visit 2 as appropriate (06/10/22); initial HEP provided at visit 2 (07/16/2022); Goal status: Met     LONG TERM GOALS: Target date: 09/02/2022   Patient will be independent with a long-term home exercise program for self-management of symptoms.  Baseline: Initial HEP to be provided at visit 2 as appropriate (06/10/22); initial HEP provided at visit 2 (07/16/2022); currently participating (07/22/2022);  Goal status: In-progress   2.  Patient will demonstrate improved FOTO by equal or greater than 10 points to demonstrate improvement in overall condition and self-reported functional ability.  Baseline: to be measured at visit 2 as appropriate (06/10/22); 51 at visit 2 (06/16/2022); 51 at visit 3 (06/30/2022); 49 at visit #10 (07/22/2022);  Goal status: In-progress   3.  Patient will score equal or greater than 25/30 on Functional Gait Assessment to demonstrate low fall risk.  Baseline: to be tested at visit 2 as appropriate (06/10/22); 13/30 high fall risk (06/16/2022); 15/30 high fall risk (07/02/2022); 16/30 high fall risk (07/22/2022);  Goal status: In-progress   4.  Patient will balance in tandem stance equal or greater than 15 seconds with each foot front to show improved static balance and decreased fall risk.  Baseline: R front = 5 seconds, L front = <1 second (06/10/22); R front = 5 seconds, L front = <1 second (06/30/2022); R front = 14 seconds, L front = 3 second (07/22/2022);  Goal status: In progress    5.  Patient will complete community, work and/or recreational activities without limitation due to current condition.  Baseline: balance, household and community mobility, ADLs, transfers, and usual mobility without falling, sleeping (06/10/22); sleeping better, continues to have difficulty with mobility but is using SPC less (07/22/2022);  Goal status: In-progress     PLAN: PT FREQUENCY: 1-2x/week   PT DURATION: 12 weeks   PLANNED INTERVENTIONS: Therapeutic exercises, Therapeutic activity, Neuromuscular re-education, Balance training, Gait training, Patient/Family education, Joint mobilization, Stair training, DME instructions, Dry Needling, Electrical stimulation, Spinal mobilization, Cryotherapy, Moist heat, Manual therapy, and Re-evaluation.   PLAN FOR NEXT SESSION: balance and ankle exercises, update HEP as appropriate.     Everlean Alstrom. Graylon Good, PT, DPT 08/12/22, 3:15 PM  [facility] [address] [place], [place] [address] P: [phone] I F: [phone]

## 2022-08-14 ENCOUNTER — Ambulatory Visit
Admission: RE | Admit: 2022-08-14 | Discharge: 2022-08-14 | Disposition: A | Discharge status: Home / Self Care | Payer: Medicare Other | Source: Ambulatory Visit | Attending: Nephrology | Admitting: Nephrology

## 2022-08-14 DIAGNOSIS — R809 Proteinuria, unspecified: Secondary | ICD-10-CM | POA: Insufficient documentation

## 2022-08-14 DIAGNOSIS — R8290 Unspecified abnormal findings in urine: Secondary | ICD-10-CM | POA: Insufficient documentation

## 2022-08-14 DIAGNOSIS — E1122 Type 2 diabetes mellitus with diabetic chronic kidney disease: Secondary | ICD-10-CM | POA: Diagnosis present

## 2022-08-17 ENCOUNTER — Ambulatory Visit: Payer: Medicare Other | Admitting: Physical Therapy

## 2022-08-17 ENCOUNTER — Encounter: Payer: Self-pay | Admitting: Physical Therapy

## 2022-08-17 DIAGNOSIS — R262 Difficulty in walking, not elsewhere classified: Secondary | ICD-10-CM

## 2022-08-17 DIAGNOSIS — R208 Other disturbances of skin sensation: Secondary | ICD-10-CM

## 2022-08-17 DIAGNOSIS — R2681 Unsteadiness on feet: Secondary | ICD-10-CM

## 2022-08-17 NOTE — Therapy (Signed)
OUTPATIENT PHYSICAL THERAPY TREATMENT NOTE   Patient Name: Mark Holmes MRN: [id number] DOB:09-13-1935, 87 y.o., male Today's Date: 08/17/2022  PCP: Ria Bush, MD REFERRING PROVIDER: Ria Bush, MD  END OF SESSION:   PT End of Session - 08/17/22 0910     Visit Number 17    Number of Visits 24    Date for PT Re-Evaluation 09/02/22    Authorization Type [employer] MEDICARE reporting period from 07/22/2022    Progress Note Due on Visit 20    PT Start Time 0903    PT Stop Time 0945    PT Time Calculation (min) 42 min    Equipment Utilized During Treatment Gait belt    Activity Tolerance Patient tolerated treatment well    Behavior During Therapy WFL for tasks assessed/performed                 Past Medical History:  Diagnosis Date   Benign prostatic hypertrophy with elevated PSA    per Dr. Jacqlyn Larsen   History of CT scan of brain 1992   normal   Hyperglycemia 01/2006   102   Hyperlipemia    Hypertension    Seizure disorder [facility])    Past Surgical History:  Procedure Laterality Date   ANTERIOR CERVICAL DECOMP/DISCECTOMY FUSION  05/2018   for myelopathy -C3/4 ([place] [facility])   CARPAL TUNNEL RELEASE Right 06/2019   ([place])   CATARACT EXTRACTION W/ INTRAOCULAR LENS IMPLANT Right 2012   [facility] [facility]   CATARACT EXTRACTION W/PHACO Left 01/14/2022   Procedure: CATARACT EXTRACTION PHACO AND INTRAOCULAR LENS PLACEMENT ([place]) LEFT;  Surgeon: Leandrew Koyanagi, MD;  Location: [place];  Service: Ophthalmology;  Laterality: Left;  7.55 01:03.6   History of EEG  1985   normal   hospitalization  1981   observation for seizuare   TONSILLECTOMY     TRANSURETHRAL RESECTION OF PROSTATE  10/2005   [facility]   TRANSURETHRAL RESECTION OF PROSTATE  08/2017   (rpt) [place]   Patient Active Problem List   Diagnosis Date Noted   Bilateral hearing loss 06/30/2022   Brain aneurysm 06/30/2022   Acute stroke due to ischemia ([place]) 06/20/2022   Nephrotic range  proteinuria 06/04/2022   Hypoproteinemia ([place]) 06/01/2022   Atypical chest pain 07/10/2020   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   Unsteadiness on feet 11/08/2019   History of spinal surgery 01/17/2019   Neck pain 01/17/2019   Weakness of hand 01/17/2019   BPH (benign prostatic hyperplasia) 30/06/6225   Systolic murmur 33/35/4562   Right carpal tunnel syndrome 02/02/2018   Ulnar neuropathy at elbow, left 02/02/2018   Peripheral neuropathy 01/26/2018   Bilateral buttock pain 06/29/2016   Failed vision screen 06/29/2016   Advanced care planning/counseling discussion 06/21/2015   Medicare annual wellness visit, subsequent 06/18/2014   Health maintenance examination 06/18/2014   Chronic ankle pain 06/18/2014   Incomplete emptying of bladder 06/07/2013   Vitamin D deficiency 05/20/2013   THYROID NODULE 05/17/2007   Mixed hyperlipidemia 05/17/2007   Essential tremor 05/17/2007   Essential hypertension 05/17/2007   Cervical spondylosis with myelopathy and radiculopathy 05/17/2007   Seizure disorder ([place]) 05/17/2007    REFERRING DIAG: other polyneuropathy, acute stroke due to ischemia, double vision  THERAPY DIAG:  Difficulty in walking, not elsewhere classified  Unsteadiness on feet  Other disturbances of skin sensation  Rationale for Evaluation and Treatment: Rehabilitation  ONSET DATE: polyneuropathy chronic, CVA/Double vision 06/20/2022  PERTINENT HISTORY: Patient is a 87 y.o. male who presents to  outpatient physical therapy with a referral for medical diagnosis other polyneuropathy, acute stroke due to ischemia, double vision. This patient's chief complaints consist of altered sensation, feeling of tightness in bilateral LE below the knees, R ankle pain, low back pain, and difficulty hearing out of the right ear leading to the following functional deficits: difficulty with balance, household and community mobility, ADLs, transfers, and usual mobility without falling, sleeping. 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    PRECAUTIONS: Fall  SUBJECTIVE: [facility]. He denies pain and states he feels like his balance is getting better. He is able to close his eyes some in the shower and he feels his feet working to keep him up. His dog tried to chase a rabbit when he walked outside and he was able to hold onto a post to keep from falling. He thinks his back is feeling better in the mornings. He thinks the ball slam exercise might be helping it. He is still taking Dilantin. His neurologist is planning on waiting until after EEG is complete to discuss next steps with his medication. He thinks the testing for his kidneys should be back by then as well.   PAIN:  Are you having pain? No  OBJECTIVE   TODAY'S TREATMENT   Therapeutic exercise: to centralize symptoms and improve ROM, strength, muscular endurance, and activity tolerance required for successful completion of functional activities.  - sit <> stand from 17 inch chair to forwards ball slam, ball pick up to stand,  then sit. 15# slam ball, 3x12. Seated rest in between.  - standing modified mountain climbers, B UE support on TM bar, cuing to keep forefeet on line about 3 feet behind bar. 3x10 with cuing to engage abdominals. 4# AW on each LE.   Neuromuscular Re-education: to improve, balance, postural strength, muscle activation patterns, and stabilization strength required for functional activities: - side stepping on 5 foot arex beam, 3x5 times each direction, CGA with TM bar available in front for UE support as needed. Flexed hips noted.   - standing cone tip/right 1x10 each foot with CGA.  - standing trunk rotation holding 10#DB with self-selected stance on airex pad. 3x20 each direction.  Occasional LOB with need for UE support. CGA-SBA  Pt required multimodal cuing for proper technique and to facilitate improved neuromuscular control, strength, range of motion, and functional ability resulting in improved performance and form.   PATIENT EDUCATION:  Education details: Exercise purpose/form. Self management techniques. Education on diagnosis, prognosis, POC, anatomy and physiology of current condition.    Person educated: Patient Education method: ECustomer service managerEducation comprehension: verbalized understanding, demonstrated understandin, and needs further education     HOME EXERCISE PROGRAM: Access Code: [email or web address] URL: [email or web address] Date: 07/09/2022  Prepared by: Rosita Kea  Exercises - Half Tandem Stance Balance with Head Rotation  - 2-3 x weekly - 1 sets - 20 reps - Standing Heel Raises  - 1 x daily - 3 sets - 10 reps - Standing Gastroc Stretch at Counter  - 1 x daily - 3 reps - 30 seconds hold - Seated Ankle Eversion with Resistance  - 1 x daily - 3 sets - 10 reps - Seated Heel Raise  - 3 sets - 10 reps - Lateral Inside Outside with Agility Ladder  - Forward Shuffle- Two Feet In, Two Feet Out with Agility Ladder  - Sit to Stand with Arm Reach and Jump   - 3 x weekly - 3 sets - 10 reps - Side Lunge with Counter Support  - 3 x weekly - 2 sets - 20 reps - Standing Alternating Partial Lunge  - 3 x weekly - 2 sets - 10-20 reps  [email or web address]  [[email or web address]]   ANKLE CIRCLES -  Repeat 20 Times, Hold 1 Second(s), Complete 3 Sets, Perform 1 Times a Day     ASSESSMENT:   CLINICAL IMPRESSION: Patient tolerated treatment well overall with appropriate challenge with current exercises. He continues to require CGA-min A guarding for more advanced balance exercise to prevent falls. His right ankle is not holding him back as much with his new brace, but still gives him some trouble with single leg stance exercises especially. Plan to continue working on improving activity tolerance and functional balance. Patient would benefit from continued management of limiting condition by skilled physical therapist to address remaining impairments and functional limitations to work towards stated goals and return to PLOF or maximal functional independence.    Patient is a 87 y.o. male referred to outpatient physical therapy with a medical diagnosis of other polyneuropathy, acute stroke due to ischemia, double vision who presents with signs and symptoms consistent with imbalance, difficulty with motor control and ambulation consistent with peripheral neuropathy and possible contribution from history of cervical myelopathy. Patient is negative for concordant symptoms with lumbar AROM and lower extremity neurodynamic testing, significantly decreasing likelihood that primary symptoms are from lumbar radiculopathy or sciatica. Patient presents with significant balance, sensory, motor control, postural control, pain, ROM, joint stiffness, muscle performance (strength/power/endurance), knowledge, and activity tolerance impairments that are limiting ability to complete his usual activities such as balance, household and community mobility, ADLs, transfers, and usual mobility without  falling, sleeping without difficulty. Patient will benefit from skilled physical therapy intervention to address current body structure impairments and activity limitations to improve function and work towards goals set in current POC in order to return to prior level of function or maximal functional improvement.      OBJECTIVE IMPAIRMENTS Abnormal gait, decreased activity tolerance, decreased balance, decreased coordination, decreased endurance, decreased knowledge of condition, decreased mobility, difficulty walking, decreased ROM, decreased strength, increased edema, impaired sensation, and pain.    ACTIVITY LIMITATIONS carrying, lifting, bending, sitting, standing, squatting, sleeping, stairs, transfers, bed mobility, dressing, and locomotion level   PARTICIPATION LIMITATIONS: shopping, community activity, yard work, and   Insurance underwriter, household and community mobility, ADLs, transfers, and usual mobility without falling, sleeping.   PERSONAL FACTORS Age, Past/current experiences, Time since onset of injury/illness/exacerbation, and 3+ comorbidities:   acute CVA on 06/20/2022 (MRI showed punctate acute/subacute nonhemorrhagic infarct in the posterior left lower midbrain near the 4th cranial nerve nucleus with remote lacunar infarcts of the right thalamus and right cerebellum),  65m PCOM aneurysm (found incidentally on  CTA 06/20/2022, currently undergoing conservative monitoring), concern over dilantin related neuropathy, systolic murmur, benign prostatic hyperplasia with urinary obstruction (followed by urology), chronic R ankle pain (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    REHAB POTENTIAL: Fair patient previously exhausted improvements in PT and has continued doing HEP regularly since then. Underlying cause of issue (neuropathy) unlikely to be able to be reversed by PT, remote lacunar infarcts on cerebellum found on recent brain MRI suggest chronic deficits in motor control behind ataxic symtptoms.    CLINICAL DECISION MAKING: Stable/uncomplicated   EVALUATION COMPLEXITY: Low     GOALS: Goals reviewed with patient? No   SHORT TERM GOALS: Target date: 06/24/2022   Patient will be independent with initial home exercise program for self-management of symptoms. Baseline: Initial HEP to be provided at visit 2 as appropriate (06/10/22); initial HEP provided at visit 2 (07/16/2022); Goal status: Met     LONG TERM GOALS: Target date: 09/02/2022   Patient will be independent with a long-term home exercise program for self-management of symptoms.  Baseline: Initial HEP to be provided at visit 2 as appropriate (06/10/22); initial HEP provided at visit 2 (07/16/2022); currently participating (07/22/2022);  Goal status: In-progress   2.  Patient will demonstrate improved FOTO by equal or greater than 10 points to demonstrate improvement in overall condition and self-reported functional ability.  Baseline: to be measured at visit 2 as appropriate (06/10/22); 51 at visit 2 (06/16/2022); 51 at visit 3 (06/30/2022); 49 at visit #10 (07/22/2022);  Goal status: In-progress   3.  Patient will score equal or greater than 25/30 on Functional Gait Assessment to demonstrate low fall risk.  Baseline: to be tested at visit 2 as appropriate (06/10/22); 13/30 high fall risk (06/16/2022); 15/30 high fall risk (07/02/2022); 16/30 high fall risk (07/22/2022);  Goal status: In-progress   4.  Patient will balance in tandem stance equal or greater than 15 seconds with each foot front to show improved static balance and decreased  fall risk.  Baseline: R front = 5 seconds, L front = <1 second (06/10/22); R front = 5 seconds, L front = <1 second (06/30/2022); R front = 14 seconds, L front = 3 second (07/22/2022);  Goal status: In progress   5.  Patient will complete community, work and/or recreational activities without limitation due to current condition.  Baseline: balance, household and community mobility, ADLs, transfers, and usual mobility without falling, sleeping (06/10/22); sleeping better, continues to have difficulty with mobility but is using SPC less (07/22/2022);  Goal status: In-progress     PLAN: PT FREQUENCY: 1-2x/week   PT DURATION: 12 weeks   PLANNED INTERVENTIONS: Therapeutic exercises, Therapeutic activity, Neuromuscular re-education, Balance training, Gait training, Patient/Family education, Joint mobilization, Stair training, DME instructions, Dry Needling, Electrical stimulation, Spinal mobilization, Cryotherapy, Moist heat, Manual therapy, and Re-evaluation.   PLAN FOR NEXT SESSION: balance and ankle exercises, update HEP as appropriate.     Everlean Alstrom. Graylon Good, PT, DPT 08/17/22, 9:45 AM  [facility] [address] [place], [place] [address] P: [phone] I F: [phone]

## 2022-08-18 ENCOUNTER — Encounter: Payer: Medicare Other | Admitting: Physical Therapy

## 2022-08-19 ENCOUNTER — Ambulatory Visit: Payer: Medicare Other | Admitting: Physical Therapy

## 2022-08-19 ENCOUNTER — Encounter: Payer: Self-pay | Admitting: Physical Therapy

## 2022-08-19 VITALS — BP 150/78 | HR 101

## 2022-08-19 DIAGNOSIS — R262 Difficulty in walking, not elsewhere classified: Secondary | ICD-10-CM

## 2022-08-19 DIAGNOSIS — R2681 Unsteadiness on feet: Secondary | ICD-10-CM

## 2022-08-19 NOTE — Therapy (Signed)
OUTPATIENT PHYSICAL THERAPY TREATMENT NOTE   Patient Name: Mark Holmes MRN: [id number] DOB:[birth year], 87 y.o., male Today's Date: 08/19/2022  PCP: Ria Bush, MD REFERRING PROVIDER: Ria Bush, MD  END OF SESSION:   PT End of Session - 08/19/22 1040     Visit Number 18    Number of Visits 24    Date for PT Re-Evaluation 09/02/22    Authorization Type [employer] MEDICARE reporting period from 07/22/2022    Progress Note Due on Visit 20    PT Start Time 1039    PT Stop Time 1119    PT Time Calculation (min) 40 min    Equipment Utilized During Treatment Gait belt    Activity Tolerance Patient tolerated treatment well    Behavior During Therapy WFL for tasks assessed/performed             Past Medical History:  Diagnosis Date   Benign prostatic hypertrophy with elevated PSA    per Dr. Jacqlyn Larsen   History of CT scan of brain 1992   normal   Hyperglycemia 01/2006   102   Hyperlipemia    Hypertension    Seizure disorder [facility])    Past Surgical History:  Procedure Laterality Date   ANTERIOR CERVICAL DECOMP/DISCECTOMY FUSION  05/2018   for myelopathy -C3/4 ([place] [facility])   CARPAL TUNNEL RELEASE Right 06/2019   ([place])   CATARACT EXTRACTION W/ INTRAOCULAR LENS IMPLANT Right 2012   [place] [facility]   CATARACT EXTRACTION W/PHACO Left 01/14/2022   Procedure: CATARACT EXTRACTION PHACO AND INTRAOCULAR LENS PLACEMENT ([place]) LEFT;  Surgeon: Leandrew Koyanagi, MD;  Location: [place];  Service: Ophthalmology;  Laterality: Left;  7.55 01:03.6   History of EEG  1985   normal   hospitalization  1981   observation for seizuare   TONSILLECTOMY     TRANSURETHRAL RESECTION OF PROSTATE  10/2005   [facility]   TRANSURETHRAL RESECTION OF PROSTATE  08/2017   (rpt) [place]   Patient Active Problem List   Diagnosis Date Noted   Bilateral hearing loss 06/30/2022   Brain aneurysm 06/30/2022   Acute stroke due to ischemia ([place]) 06/20/2022   Nephrotic range  proteinuria 06/04/2022   Hypoproteinemia ([place]) 06/01/2022   Atypical chest pain 07/10/2020   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   Unsteadiness on feet 11/08/2019   History of spinal surgery 01/17/2019   Neck pain 01/17/2019   Weakness of hand 01/17/2019   BPH (benign prostatic hyperplasia) 40/98/1191   Systolic murmur 47/82/9562   Right carpal tunnel syndrome 02/02/2018   Ulnar neuropathy at elbow, left 02/02/2018   Peripheral neuropathy 01/26/2018   Bilateral buttock pain 06/29/2016   Failed vision screen 06/29/2016   Advanced care planning/counseling discussion 06/21/2015   Medicare annual wellness visit, subsequent 06/18/2014   Health maintenance examination 06/18/2014   Chronic ankle pain 06/18/2014   Incomplete emptying of bladder 06/07/2013   Vitamin D deficiency 05/20/2013   THYROID NODULE 05/17/2007   Mixed hyperlipidemia 05/17/2007   Essential tremor 05/17/2007   Essential hypertension 05/17/2007   Cervical spondylosis with myelopathy and radiculopathy 05/17/2007   Seizure disorder ([place]) 05/17/2007    REFERRING DIAG: other polyneuropathy, acute stroke due to ischemia, double vision  THERAPY DIAG:  Difficulty in walking, not elsewhere classified  Unsteadiness on feet  Rationale for Evaluation and Treatment: Rehabilitation  ONSET DATE: polyneuropathy chronic, CVA/Double vision 06/20/2022  PERTINENT HISTORY: Patient is a 87 y.o. male who presents to outpatient physical therapy with a referral for medical diagnosis other  polyneuropathy, acute stroke due to ischemia, double vision. This patient's chief complaints consist of altered sensation, feeling of tightness in bilateral LE below the knees, R ankle pain, low back pain, and difficulty hearing out of the right ear leading to the following functional deficits: difficulty with balance, household and community mobility, ADLs, transfers, and usual mobility without falling, sleeping. 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    PRECAUTIONS: Fall  SUBJECTIVE: [facility] He he states he was a little tired after last PT session but no pain or soreness. He states his right ankle is doing better and feels the brace is helping. He denies falls since last PT session. He did some of his HEP since last PT session 2 days ago.   PAIN:  Are you having pain? No  OBJECTIVE  Vitals:   08/19/22 1118  BP: (!) 150/78  Pulse: (!) 101  SpO2: 97%     TODAY'S TREATMENT   Therapeutic exercise: to centralize symptoms and improve ROM, strength, muscular endurance, and activity tolerance required for successful completion of functional activities.  - sit <> stand from 17 inch chair to forwards ball slam, ball pick up to stand, then sit. 15# slam ball, 3x12. Seated rest in between.  - standing modified mountain climbers, B UE support on TM bar, cuing to keep forefeet on line about 3 feet behind bar. 3x10 with cuing to engage abdominals. 7.5# AW on each LE.   Neuromuscular Re-education: to improve, balance, postural  strength, muscle activation patterns, and stabilization strength required for functional activities: - side stepping on 5 foot arex beam, 3x5 times each direction, CGA with TM bar available in front for UE support as needed for first set. Flexed hips and difficulty looking up.  - standing cone tip/right 1x10 each foot with CGA- min A.  - standing trunk rotation holding 15# slam ball with self-selected stance on airex pad. 3x20 each direction.  Occasional LOB with need for UE support. CGA-SBA - Vitals check 2-3 min after first set of trunk rotation after patient reported feeling lightheaded (see above)  Pt required multimodal cuing for proper technique and to facilitate improved neuromuscular control, strength, range of motion, and functional ability resulting in improved performance and form.   PATIENT EDUCATION:  Education details: Exercise purpose/form. Self management techniques. Education on diagnosis, prognosis, POC, anatomy and physiology of current condition.    Person educated: Patient Education method: ECustomer service managerEducation comprehension: verbalized understanding, demonstrated understandin, and needs further education     HOME EXERCISE PROGRAM: Access Code: [email or web address] URL: [email or web address] Date: 07/09/2022 Prepared by: SRosita Kea Exercises - Half Tandem Stance Balance with Head Rotation  - 2-3 x weekly - 1 sets - 20 reps - Standing Heel  Raises  - 1 x daily - 3 sets - 10 reps - Standing Gastroc Stretch at Counter  - 1 x daily - 3 reps - 30 seconds hold - Seated Ankle Eversion with Resistance  - 1 x daily - 3 sets - 10 reps - Seated Heel Raise  - 3 sets - 10 reps - Lateral Inside Outside with Agility Ladder  - Forward Shuffle- Two Feet In, Two Feet Out with Agility Ladder  - Sit to Stand with Arm Reach and Jump  - 3 x weekly - 3 sets - 10 reps - Side Lunge with Counter Support  - 3 x weekly - 2 sets - 20 reps - Standing Alternating Partial  Lunge  - 3 x weekly - 2 sets - 10-20 reps  [email or web address]  [[email or web address]]   ANKLE CIRCLES -  Repeat 20 Times, Hold 1 Second(s), Complete 3 Sets, Perform 1 Times a Day     ASSESSMENT:   CLINICAL IMPRESSION: Patient tolerated treatment well overall with some feeling of unsteadiness or lightheadedness with last exercise. BP within expected range. Patient pushed himself with minimal rest between activities. Continues to tolerate R SLS better than prior to getting the brace. Patient continues to have balance deficits that affect his daily life. Patient would benefit from continued management of limiting condition by skilled physical therapist to address remaining impairments and functional limitations to work towards stated goals and return to PLOF or maximal functional independence.   Patient is a 87 y.o. male referred to outpatient physical therapy with a medical diagnosis of other polyneuropathy, acute stroke due to ischemia, double vision who presents with signs and symptoms consistent with imbalance, difficulty with motor control and ambulation consistent with peripheral neuropathy and possible contribution from history of cervical myelopathy. Patient is negative for concordant symptoms with lumbar AROM and lower extremity neurodynamic testing, significantly decreasing likelihood that primary symptoms are from lumbar radiculopathy or sciatica. Patient presents with significant balance, sensory, motor control, postural control, pain, ROM, joint stiffness, muscle performance (strength/power/endurance), knowledge, and activity tolerance impairments that are limiting ability to complete his usual activities such as balance, household and community mobility, ADLs, transfers, and usual mobility without falling, sleeping without difficulty. Patient will benefit from skilled physical therapy intervention to address current body structure impairments and activity limitations to improve function and work towards  goals set in current POC in order to return to prior level of function or maximal functional improvement.      OBJECTIVE IMPAIRMENTS Abnormal gait, decreased activity tolerance, decreased balance, decreased coordination, decreased endurance, decreased knowledge of condition, decreased mobility, difficulty walking, decreased ROM, decreased strength, increased edema, impaired sensation, and pain.    ACTIVITY LIMITATIONS carrying, lifting, bending, sitting, standing, squatting, sleeping, stairs, transfers, bed mobility, dressing, and locomotion level   PARTICIPATION LIMITATIONS: shopping, community activity, yard work, and   Insurance underwriter, household and community mobility, ADLs, transfers, and usual mobility without falling, sleeping.   PERSONAL FACTORS Age, Past/current experiences, Time since onset of injury/illness/exacerbation, and 3+ comorbidities:   acute CVA on 06/20/2022 (MRI showed punctate acute/subacute nonhemorrhagic infarct in the posterior left lower midbrain near the 4th cranial nerve nucleus with remote lacunar infarcts of the right thalamus and right cerebellum),  5m PCOM aneurysm (found incidentally on CTA 06/20/2022, currently undergoing conservative monitoring), concern over dilantin related neuropathy, systolic murmur, benign prostatic hyperplasia with urinary obstruction (followed by urology), chronic R ankle pain (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    REHAB POTENTIAL: Fair patient previously exhausted improvements in PT and has continued doing HEP regularly since then. Underlying cause  of issue (neuropathy) unlikely to be able to be reversed by PT, remote lacunar infarcts on cerebellum found on recent brain MRI suggest chronic deficits in motor control behind ataxic symtptoms.    CLINICAL DECISION MAKING: Stable/uncomplicated   EVALUATION COMPLEXITY: Low     GOALS: Goals reviewed with patient? No   SHORT TERM GOALS: Target date: 06/24/2022   Patient will be independent with initial home exercise program for self-management of symptoms. Baseline: Initial HEP to be provided at visit 2 as appropriate (06/10/22); initial HEP provided at visit 2 (07/16/2022); Goal status: Met     LONG TERM GOALS: Target date: 09/02/2022   Patient will be independent with a long-term home exercise program for self-management of symptoms.  Baseline: Initial HEP to be provided at visit 2 as appropriate (06/10/22); initial HEP provided at visit 2 (07/16/2022); currently participating (07/22/2022);  Goal status: In-progress   2.  Patient will demonstrate improved FOTO by equal or greater than 10 points to demonstrate improvement in overall condition and self-reported functional ability.  Baseline: to be measured at visit 2 as appropriate (06/10/22); 51 at visit 2 (06/16/2022); 51 at visit 3 (06/30/2022); 49 at visit #10 (07/22/2022);  Goal status: In-progress   3.  Patient will score equal or greater than 25/30 on Functional Gait Assessment to demonstrate low fall risk.  Baseline: to be tested at visit 2 as appropriate (06/10/22); 13/30 high fall risk (06/16/2022); 15/30 high fall risk (07/02/2022); 16/30 high fall risk (07/22/2022);  Goal status: In-progress   4.  Patient will balance in tandem stance equal or greater than 15 seconds with each foot front to show improved static balance and decreased fall risk.  Baseline: R front = 5 seconds, L front = <1 second (06/10/22); R front = 5 seconds, L front = <1 second (06/30/2022); R front = 14 seconds, L front = 3 second (07/22/2022);  Goal status: In progress    5.  Patient will complete community, work and/or recreational activities without limitation due to current condition.  Baseline: balance, household and community mobility, ADLs, transfers, and usual mobility without falling, sleeping (06/10/22); sleeping better, continues to have difficulty with mobility but is using SPC less (07/22/2022);  Goal status: In-progress     PLAN: PT FREQUENCY: 1-2x/week   PT DURATION: 12 weeks   PLANNED INTERVENTIONS: Therapeutic exercises, Therapeutic activity, Neuromuscular re-education, Balance training, Gait training, Patient/Family education, Joint mobilization, Stair training, DME instructions, Dry Needling, Electrical stimulation, Spinal mobilization, Cryotherapy, Moist heat, Manual therapy, and Re-evaluation.   PLAN FOR NEXT SESSION: balance and ankle exercises, update HEP as appropriate.     Everlean Alstrom. Graylon Good, PT, DPT 08/19/22, 11:32 AM  [facility] [address] [place], [place] [address] P: [phone] I F: [phone]

## 2022-08-25 ENCOUNTER — Encounter: Payer: Self-pay | Admitting: Physical Therapy

## 2022-08-25 ENCOUNTER — Ambulatory Visit: Payer: Medicare Other | Attending: Family Medicine | Admitting: Physical Therapy

## 2022-08-25 DIAGNOSIS — R262 Difficulty in walking, not elsewhere classified: Secondary | ICD-10-CM | POA: Insufficient documentation

## 2022-08-25 DIAGNOSIS — R208 Other disturbances of skin sensation: Secondary | ICD-10-CM | POA: Insufficient documentation

## 2022-08-25 DIAGNOSIS — R2681 Unsteadiness on feet: Secondary | ICD-10-CM | POA: Insufficient documentation

## 2022-08-25 NOTE — Therapy (Signed)
OUTPATIENT PHYSICAL THERAPY TREATMENT NOTE   Patient Name: Mark Holmes MRN: [id number] DOB:10-01-1935, 87 y.o., male Today's Date: 08/25/2022  PCP: Ria Bush, MD REFERRING PROVIDER: Ria Bush, MD  END OF SESSION:   PT End of Session - 08/25/22 1034     Visit Number 19    Number of Visits 24    Date for PT Re-Evaluation 09/02/22    Authorization Type [employer] MEDICARE reporting period from 07/22/2022    Progress Note Due on Visit 20    PT Start Time 0952    PT Stop Time 1030    PT Time Calculation (min) 38 min    Equipment Utilized During Treatment Gait belt    Activity Tolerance Patient tolerated treatment well    Behavior During Therapy WFL for tasks assessed/performed              Past Medical History:  Diagnosis Date   Benign prostatic hypertrophy with elevated PSA    per Dr. Jacqlyn Larsen   History of CT scan of brain 1992   normal   Hyperglycemia 01/2006   102   Hyperlipemia    Hypertension    Seizure disorder [facility]    Past Surgical History:  Procedure Laterality Date   ANTERIOR CERVICAL DECOMP/DISCECTOMY FUSION  05/2018   for myelopathy -C3/4 ([place] [facility])   CARPAL TUNNEL RELEASE Right 06/2019   ([place])   CATARACT EXTRACTION W/ INTRAOCULAR LENS IMPLANT Right 2012   [facility]   CATARACT EXTRACTION W/PHACO Left 01/14/2022   Procedure: CATARACT EXTRACTION PHACO AND INTRAOCULAR LENS PLACEMENT ([place]) LEFT;  Surgeon: Leandrew Koyanagi, MD;  Location: [place];  Service: Ophthalmology;  Laterality: Left;  7.55 01:03.6   History of EEG  1985   normal   hospitalization  1981   observation for seizuare   TONSILLECTOMY     TRANSURETHRAL RESECTION OF PROSTATE  10/2005   [facility]   TRANSURETHRAL RESECTION OF PROSTATE  08/2017   (rpt) [facility]   Patient Active Problem List   Diagnosis Date Noted   Bilateral hearing loss 06/30/2022   Brain aneurysm 06/30/2022   Acute stroke due to ischemia ([place]) 06/20/2022   Nephrotic range  proteinuria 06/04/2022   Hypoproteinemia ([place]) 06/01/2022   Atypical chest pain 07/10/2020   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   Unsteadiness on feet 11/08/2019   History of spinal surgery 01/17/2019   Neck pain 01/17/2019   Weakness of hand 01/17/2019   BPH (benign prostatic hyperplasia) 78/29/5621   Systolic murmur 30/86/5784   Right carpal tunnel syndrome 02/02/2018   Ulnar neuropathy at elbow, left 02/02/2018   Peripheral neuropathy 01/26/2018   Bilateral buttock pain 06/29/2016   Failed vision screen 06/29/2016   Advanced care planning/counseling discussion 06/21/2015   Medicare annual wellness visit, subsequent 06/18/2014   Health maintenance examination 06/18/2014   Chronic ankle pain 06/18/2014   Incomplete emptying of bladder 06/07/2013   Vitamin D deficiency 05/20/2013   THYROID NODULE 05/17/2007   Mixed hyperlipidemia 05/17/2007   Essential tremor 05/17/2007   Essential hypertension 05/17/2007   Cervical spondylosis with myelopathy and radiculopathy 05/17/2007   Seizure disorder ([place]) 05/17/2007    REFERRING DIAG: other polyneuropathy, acute stroke due to ischemia, double vision  THERAPY DIAG:  Difficulty in walking, not elsewhere classified  Unsteadiness on feet  Other disturbances of skin sensation  Rationale for Evaluation and Treatment: Rehabilitation  ONSET DATE: polyneuropathy chronic, CVA/Double vision 06/20/2022  PERTINENT HISTORY: Patient is a 87 y.o. male who presents to outpatient physical therapy  with a referral for medical diagnosis other polyneuropathy, acute stroke due to ischemia, double vision. This patient's chief complaints consist of altered sensation, feeling of tightness in bilateral LE below the knees, R ankle pain, low back pain, and difficulty hearing out of the right ear leading to the following functional deficits: difficulty with balance, household and community mobility, ADLs, transfers, and usual mobility without falling, sleeping. 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    PRECAUTIONS: Fall  SUBJECTIVE: Patient arrives with [facility] Medical Groupand right ankle brace. He states his right ankle is bothering him a little and rates the pain there 3/10. Denies falls since last PT session. He states he has difficulty turning around when walking and walking long distances such as going to [employer]   PAIN:  Are you having pain? 3/10 R ankle  OBJECTIVE   TODAY'S TREATMENT   Therapeutic exercise: to centralize symptoms and improve ROM, strength, muscular endurance, and activity tolerance required for successful completion of functional activities.  - sit <> stand from 17 inch chair to forwards ball slam, ball pick up to stand, then sit. 15# slam ball, 3x12. Seated rest in between.  - standing modified mountain climbers, B UE support on TM bar, cuing to keep forefeet on line about 3 feet behind bar. 3x12 with cuing to engage abdominals. 7.5# AW on each LE.   Neuromuscular Re-education: to improve, balance, postural strength, muscle activation patterns, and stabilization  strength required for functional activities: - figure 8 walking, 1x2 min,  holding SPC and using when needed - walking with left turning 180 degrees, 3x1 min, holding SPC and using when needed - side stepping on 5 foot arex beam, 1x3, 1x5 times each direction, CGA, no UE available.  Flexed hips and difficulty looking up.   Pt required multimodal cuing for proper technique and to facilitate improved neuromuscular control, strength, range of motion, and functional ability resulting in improved performance and form.   PATIENT EDUCATION:  Education details: Exercise purpose/form. Self management techniques. Education on diagnosis, prognosis, POC, anatomy and physiology of current condition.    Person educated: Patient Education method: ECustomer service managerEducation comprehension: verbalized understanding, demonstrated understandin, and needs further education     HOME EXERCISE PROGRAM: Access Code: [email or web address] URL: [email or web address] Date: 07/09/2022 Prepared by: SRosita Kea Exercises - Half Tandem Stance Balance with Head Rotation  - 2-3 x weekly - 1 sets - 20 reps - Standing Heel Raises  - 1 x daily - 3 sets - 10 reps - Standing Gastroc Stretch at Counter  - 1 x daily - 3 reps - 30 seconds hold - Seated Ankle Eversion with Resistance  - 1 x daily - 3 sets - 10 reps - Seated Heel Raise  - 3 sets - 10 reps -  Lateral Inside Outside with [place] In, Two Feet Out with Agility Ladder  - Sit to Stand with Arm Reach and Jump  - 3 x weekly - 3 sets - 10 reps - Side Lunge with Counter Support  - 3 x weekly - 2 sets - 20 reps - Standing Alternating Partial Lunge  - 3 x weekly - 2 sets - 10-20 reps  [email or web address]  [[id number]]   ANKLE CIRCLES -  Repeat 20 Times, Hold 1 Second(s), Complete 3 Sets, Perform 1 Times a Day     ASSESSMENT:   CLINICAL IMPRESSION: Patient tolerated treatment well today overall. Included turning to the  challenging side (left) in today's session. Patient fatigued with lots of standing and had more posterior LOB on airex beam exercise. Patient would benefit from continued management of limiting condition by skilled physical therapist to address remaining impairments and functional limitations to work towards stated goals and return to PLOF or maximal functional independence.   Patient is a 87 y.o. male referred to outpatient physical therapy with a medical diagnosis of other polyneuropathy, acute stroke due to ischemia, double vision who presents with signs and symptoms consistent with imbalance, difficulty with motor control and ambulation consistent with peripheral neuropathy and possible contribution from history of cervical myelopathy. Patient is negative for concordant symptoms with lumbar AROM and lower extremity neurodynamic testing, significantly decreasing likelihood that primary symptoms are from lumbar radiculopathy or sciatica. Patient presents with significant balance, sensory, motor control, postural control, pain, ROM, joint stiffness, muscle performance (strength/power/endurance), knowledge, and activity tolerance impairments that are limiting ability to complete his usual activities such as balance, household and community mobility, ADLs, transfers, and usual mobility without falling, sleeping without difficulty. Patient will benefit from skilled physical therapy intervention to address current body structure impairments and activity limitations to improve function and work towards goals set in current POC in order to return to prior level of function or maximal functional improvement.      OBJECTIVE IMPAIRMENTS Abnormal gait, decreased activity tolerance, decreased balance, decreased coordination, decreased endurance, decreased knowledge of condition, decreased mobility, difficulty walking, decreased ROM, decreased strength, increased edema, impaired sensation, and pain.    ACTIVITY  LIMITATIONS carrying, lifting, bending, sitting, standing, squatting, sleeping, stairs, transfers, bed mobility, dressing, and locomotion level   PARTICIPATION LIMITATIONS: shopping, community activity, yard work, and   Insurance underwriter, household and community mobility, ADLs, transfers, and usual mobility without falling, sleeping.   PERSONAL FACTORS Age, Past/current experiences, Time since onset of injury/illness/exacerbation, and 3+ comorbidities:   acute CVA on 06/20/2022 (MRI showed punctate acute/subacute nonhemorrhagic infarct in the posterior left lower midbrain near the 4th cranial nerve nucleus with remote lacunar infarcts of the right thalamus and right cerebellum),  43m PCOM aneurysm (found incidentally on CTA 06/20/2022, currently undergoing conservative monitoring), concern over dilantin related neuropathy, systolic murmur, benign prostatic hyperplasia with urinary obstruction (followed by urology), chronic R ankle pain (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    REHAB POTENTIAL: Fair patient previously exhausted improvements in PT and has continued doing HEP regularly since then. Underlying cause of issue (neuropathy) unlikely to be able to  be reversed by PT, remote lacunar infarcts on cerebellum found on recent brain MRI suggest chronic deficits in motor control behind ataxic symtptoms.    CLINICAL DECISION MAKING: Stable/uncomplicated   EVALUATION COMPLEXITY: Low     GOALS: Goals reviewed with patient? No   SHORT TERM GOALS: Target date: 06/24/2022   Patient will be independent with initial  home exercise program for self-management of symptoms. Baseline: Initial HEP to be provided at visit 2 as appropriate (06/10/22); initial HEP provided at visit 2 (07/16/2022); Goal status: Met     LONG TERM GOALS: Target date: 09/02/2022   Patient will be independent with a long-term home exercise program for self-management of symptoms.  Baseline: Initial HEP to be provided at visit 2 as appropriate (06/10/22); initial HEP provided at visit 2 (07/16/2022); currently participating (07/22/2022);  Goal status: In-progress   2.  Patient will demonstrate improved FOTO by equal or greater than 10 points to demonstrate improvement in overall condition and self-reported functional ability.  Baseline: to be measured at visit 2 as appropriate (06/10/22); 51 at visit 2 (06/16/2022); 51 at visit 3 (06/30/2022); 49 at visit #10 (07/22/2022);  Goal status: In-progress   3.  Patient will score equal or greater than 25/30 on Functional Gait Assessment to demonstrate low fall risk.  Baseline: to be tested at visit 2 as appropriate (06/10/22); 13/30 high fall risk (06/16/2022); 15/30 high fall risk (07/02/2022); 16/30 high fall risk (07/22/2022);  Goal status: In-progress   4.  Patient will balance in tandem stance equal or greater than 15 seconds with each foot front to show improved static balance and decreased fall risk.  Baseline: R front = 5 seconds, L front = <1 second (06/10/22); R front = 5 seconds, L front = <1 second (06/30/2022); R front = 14 seconds, L front = 3 second (07/22/2022);  Goal status: In progress   5.  Patient will complete community, work and/or recreational activities without limitation due to current condition.  Baseline: balance, household and community mobility, ADLs, transfers, and usual mobility without falling, sleeping (06/10/22); sleeping better, continues to have difficulty with mobility but is using SPC less (07/22/2022);  Goal status: In-progress     PLAN: PT FREQUENCY: 1-2x/week    PT DURATION: 12 weeks   PLANNED INTERVENTIONS: Therapeutic exercises, Therapeutic activity, Neuromuscular re-education, Balance training, Gait training, Patient/Family education, Joint mobilization, Stair training, DME instructions, Dry Needling, Electrical stimulation, Spinal mobilization, Cryotherapy, Moist heat, Manual therapy, and Re-evaluation.   PLAN FOR NEXT SESSION: balance and ankle exercises, update HEP as appropriate.     Everlean Alstrom. Graylon Good, PT, DPT 08/25/22, 10:36 AM  [facility] [address] [place], [place] [address] P: [phone] I F: [phone]

## 2022-08-27 ENCOUNTER — Ambulatory Visit: Payer: Medicare Other | Admitting: Physical Therapy

## 2022-08-27 ENCOUNTER — Encounter: Payer: Self-pay | Admitting: Physical Therapy

## 2022-08-27 DIAGNOSIS — R208 Other disturbances of skin sensation: Secondary | ICD-10-CM

## 2022-08-27 DIAGNOSIS — R2681 Unsteadiness on feet: Secondary | ICD-10-CM

## 2022-08-27 DIAGNOSIS — R262 Difficulty in walking, not elsewhere classified: Secondary | ICD-10-CM

## 2022-08-27 NOTE — Therapy (Signed)
OUTPATIENT PHYSICAL THERAPY TREATMENT NOTE / PROGRESS NOTE / RE-CERTIFICATION Dates of reporting from 07/22/2022 to 08/27/2022   Patient Name: Mark Holmes MRN: [id number] DOB:1935/02/08, 87 y.o., male Today's Date: 08/27/2022  PCP: Ria Bush, MD REFERRING PROVIDER: Ria Bush, MD  END OF SESSION:   PT End of Session - 08/27/22 0945     Visit Number 20    Number of Visits 24    Date for PT Re-Evaluation 11/19/22    Authorization Type [employer] MEDICARE reporting period from 07/22/2022    Progress Note Due on Visit 20    PT Start Time 0945    PT Stop Time 1025    PT Time Calculation (min) 40 min    Equipment Utilized During Treatment Gait belt    Activity Tolerance Patient tolerated treatment well    Behavior During Therapy WFL for tasks assessed/performed               Past Medical History:  Diagnosis Date   Benign prostatic hypertrophy with elevated PSA    per Dr. Jacqlyn Larsen   History of CT scan of brain 1992   normal   Hyperglycemia 01/2006   102   Hyperlipemia    Hypertension    Seizure disorder [facility])    Past Surgical History:  Procedure Laterality Date   ANTERIOR CERVICAL DECOMP/DISCECTOMY FUSION  05/2018   for myelopathy -C3/4 ([place] [facility])   CARPAL TUNNEL RELEASE Right 06/2019   ([place])   CATARACT EXTRACTION W/ INTRAOCULAR LENS IMPLANT Right 2012   [facility]   CATARACT EXTRACTION W/PHACO Left 01/14/2022   Procedure: CATARACT EXTRACTION PHACO AND INTRAOCULAR LENS PLACEMENT ([place]) LEFT;  Surgeon: Leandrew Koyanagi, MD;  Location: [facility];  Service: Ophthalmology;  Laterality: Left;  7.55 01:03.6   History of EEG  1985   normal   hospitalization  1981   observation for seizuare   TONSILLECTOMY     TRANSURETHRAL RESECTION OF PROSTATE  10/2005   [facility]   TRANSURETHRAL RESECTION OF PROSTATE  08/2017   (rpt) [facility]   Patient Active Problem List   Diagnosis Date Noted   Bilateral hearing loss 06/30/2022   Brain aneurysm  06/30/2022   Acute stroke due to ischemia ([place]) 06/20/2022   Nephrotic range proteinuria 06/04/2022   Hypoproteinemia ([place]) 06/01/2022   Atypical chest pain 07/10/2020   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   Unsteadiness on feet 11/08/2019   History of spinal surgery 01/17/2019   Neck pain 01/17/2019   Weakness of hand 01/17/2019   BPH (benign prostatic hyperplasia) 75/64/3329   Systolic murmur 51/88/4166   Right carpal tunnel syndrome 02/02/2018   Ulnar neuropathy at elbow, left 02/02/2018   Peripheral neuropathy 01/26/2018   Bilateral buttock pain 06/29/2016   Failed vision screen 06/29/2016   Advanced care planning/counseling discussion 06/21/2015   Medicare annual wellness visit, subsequent 06/18/2014   Health maintenance examination 06/18/2014   Chronic ankle pain 06/18/2014   Incomplete emptying of bladder 06/07/2013   Vitamin D deficiency 05/20/2013   THYROID NODULE 05/17/2007   Mixed hyperlipidemia 05/17/2007   Essential tremor 05/17/2007   Essential hypertension 05/17/2007   Cervical spondylosis with myelopathy and radiculopathy 05/17/2007   Seizure disorder ([place]) 05/17/2007    REFERRING DIAG: other polyneuropathy, acute stroke due to ischemia, double vision  THERAPY DIAG:  Difficulty in walking, not elsewhere classified  Unsteadiness on feet  Other disturbances of skin sensation  Rationale for Evaluation and Treatment: Rehabilitation  ONSET DATE: polyneuropathy chronic, CVA/Double vision 06/20/2022  PERTINENT  HISTORY: Patient is a 87 y.o. male who presents to outpatient physical therapy with a referral for medical diagnosis other polyneuropathy, acute stroke due to ischemia, double vision. This patient's chief complaints consist of altered sensation, feeling of tightness in bilateral LE below the knees, R ankle pain, low back pain, and difficulty hearing out of the right ear leading to the following functional deficits: difficulty with balance, household and community  mobility, ADLs, transfers, and usual mobility without falling, sleeping. 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    PRECAUTIONS: Fall  SUBJECTIVE: [facility]. He states his dog took off and jerked on the leash he was holding and he was able to stay upright. He feels PT is helping him and would like to continue.   PAIN: b Are you having pain? 3/10 R ankle  OBJECTIVE   SELF-REPORTED FUNCTION FOTO score: 53/100 (balance questionnaire)   FUNCTIONAL/BALANCE TESTS: 6 Minute Walk Test: 1300 feet while holding SPC.   -Tandem stance, eyes open (best of 3 trials): R front = 13 seconds, L front = 1:14 seconds with left heel slightly lateral.    [facility] PT Assessment - 08/27/22 0001       Functional Gait  Assessment   Gait Level Surface Walks 20 ft in less than 5.5 sec, no assistive devices, good speed, no evidence for imbalance, normal gait pattern, deviates no more than 6 in outside of the 12 in walkway width.   self selected pace: 6.38 seconds; Fast pace: 4.19 seconds   Change in  Gait Speed Able to smoothly change walking speed without loss of balance or gait deviation. Deviate no more than 6 in outside of the 12 in walkway width.    Gait with Horizontal Head Turns Performs head turns smoothly with slight change in gait velocity (eg, minor disruption to smooth gait path), deviates 6-10 in outside 12 in walkway width, or uses an assistive device.    Gait with Vertical Head Turns Performs head turns with no change in gait. Deviates no more than 6 in outside 12 in walkway width.    Gait and Pivot Turn Pivot turns safely within 3 sec and stops quickly with no loss of balance.    Step Over Obstacle Is able to step over one shoe box (4.5 in total height) without changing gait speed. No evidence of imbalance.    Gait with Narrow Base of Support Ambulates 4-7 steps.   fast speed with gaps between heel/toe   Gait with Eyes Closed Walks 20 ft, slow speed, abnormal gait pattern, evidence for imbalance, deviates 10-15 in outside 12 in walkway width. Requires more than 9 sec to ambulate 20 ft.    Ambulating Backwards Walks 20 ft, uses assistive device, slower speed, mild  gait deviations, deviates 6-10 in outside 12 in walkway width.    Steps Alternating feet, must use rail.    Total Score 22    FGA comment: 19-24 = medium risk fall, no AD used               TODAY'S TREATMENT  Neuromuscular Re-education: to improve, balance, postural strength, muscle activation patterns, and stabilization strength required for functional activities: - measurements to assess progress (See above).  - cone tip/right 1x10-11 each side with CGA.   Pt required multimodal cuing for proper technique and to facilitate improved neuromuscular control, strength, range of motion, and functional ability resulting in improved performance and form.   PATIENT EDUCATION:  Education details: Exercise purpose/form. Self management techniques. Education on diagnosis, prognosis, POC, anatomy and physiology of current  condition.    Person educated: Patient Education method: Customer service manager Education comprehension: verbalized understanding, demonstrated understandin, and needs further education     HOME EXERCISE PROGRAM: Access Code: [email or web address] URL: [email or web address] Date: 07/09/2022 Prepared by: Rosita Kea  Exercises - Half Tandem Stance Balance with Head Rotation  - 2-3 x weekly - 1 sets - 20 reps - Standing Heel Raises  - 1 x daily - 3 sets - 10 reps - Standing Gastroc Stretch at Counter  - 1 x daily - 3 reps - 30 seconds hold - Seated Ankle Eversion with Resistance  - 1 x daily - 3 sets - 10 reps - Seated Heel Raise  - 3 sets - 10 reps - Lateral Inside Outside with Agility Ladder  - Forward Shuffle- Two Feet In, Two Feet Out with Agility Ladder  - Sit to Stand with Arm Reach and Jump  - 3 x weekly - 3 sets - 10 reps - Side Lunge with Counter Support  - 3 x weekly - 2 sets - 20 reps - Standing Alternating Partial Lunge  - 3 x weekly - 2 sets - 10-20 reps  [email or web address] [id number]  [[id number]]   ANKLE CIRCLES -  Repeat 20 Times, Hold 1 Second(s), Complete 3 Sets, Perform 1 Times a Day     ASSESSMENT:   CLINICAL IMPRESSION: Patient has attended 20 physical therapy sessions since starting current episode of care on 06/10/2022. Since his visit 10 progress note, patient has improved his score on Functional Gait Assessment to 22/30 which places him in the moderate fall risk category. He has also improved his FOTO score and ability to stand in tandem balance. He reports good participation in HEP and improved function at home. Patient appears to have benefited from R ankle brace that has helped R ankle instability be less contributory to balance problems. Patient has not yet reached his goals and has potential for continued improvement. Plan to continue PT with recheck of progress at visit 30. Patient would benefit from continued management of limiting condition by skilled physical  therapist to address remaining impairments and functional limitations to work towards stated goals and return to PLOF or maximal functional independence.   From PT eval on 06/10/2022:  Patient is a 87 y.o. male referred to outpatient physical therapy with a medical diagnosis of other polyneuropathy, acute stroke due to ischemia, double vision who presents with signs and symptoms consistent with imbalance, difficulty with motor control and ambulation consistent with peripheral neuropathy and possible contribution from history of cervical myelopathy. Patient is negative for concordant symptoms with lumbar AROM and lower extremity neurodynamic testing, significantly decreasing likelihood that primary symptoms are from lumbar radiculopathy or sciatica.  Patient presents with significant balance, sensory, motor control, postural control, pain, ROM, joint stiffness, muscle performance (strength/power/endurance), knowledge, and activity tolerance impairments that are limiting ability to complete his usual activities such as balance, household and community mobility, ADLs, transfers, and usual mobility without falling, sleeping without difficulty. Patient will benefit from skilled physical therapy intervention to address current body structure impairments and activity limitations to improve function and work towards goals set in current POC in order to return to prior level of function or maximal functional improvement.      OBJECTIVE IMPAIRMENTS Abnormal gait, decreased activity tolerance, decreased balance, decreased coordination, decreased endurance, decreased knowledge of condition, decreased mobility, difficulty walking, decreased ROM, decreased strength, increased edema, impaired sensation, and pain.    ACTIVITY LIMITATIONS carrying, lifting, bending, sitting, standing, squatting, sleeping, stairs, transfers, bed mobility, dressing, and locomotion level   PARTICIPATION LIMITATIONS: shopping, community  activity, yard work, and   Insurance underwriter, household and community mobility, ADLs, transfers, and usual mobility without falling, sleeping.   PERSONAL FACTORS Age, Past/current experiences, Time since onset of injury/illness/exacerbation, and 3+ comorbidities:   acute CVA on 06/20/2022 (MRI showed punctate acute/subacute nonhemorrhagic infarct in the posterior left lower midbrain near the 4th cranial nerve nucleus with remote lacunar infarcts of the right thalamus and right cerebellum),  40m PCOM aneurysm (found incidentally on CTA 06/20/2022, currently undergoing conservative monitoring), concern over dilantin related neuropathy, systolic murmur, benign prostatic hyperplasia with urinary obstruction (followed by urology), chronic R ankle pain (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    REHAB POTENTIAL: Fair patient previously exhausted improvements in PT and has continued doing HEP regularly since then. Underlying cause of issue (neuropathy) unlikely to be able to be reversed by PT, remote lacunar infarcts on cerebellum found on recent brain MRI suggest chronic deficits in motor control behind ataxic symtptoms.    CLINICAL DECISION MAKING: Stable/uncomplicated   EVALUATION COMPLEXITY: Low     GOALS: Goals reviewed with patient? No   SHORT TERM GOALS: Target date: 06/24/2022   Patient will be independent with initial home exercise program for self-management of symptoms. Baseline: Initial HEP to be provided at visit 2 as appropriate (06/10/22); initial HEP provided at visit 2 (07/16/2022); Goal status: Met      LONG TERM GOALS: Target date: 09/02/2022. UPDATED to 11/19/2022 for all unmet goals on 08/27/2022.    Patient will be independent with a long-term home exercise program for self-management of symptoms.  Baseline: Initial HEP to be provided at visit 2 as appropriate (06/10/22); initial HEP provided at visit 2 (07/16/2022); currently participating well (07/22/2022; 08/27/2022);  Goal status: In-progress   2.  Patient will demonstrate improved FOTO by equal or greater than 10 points to demonstrate improvement in overall condition and self-reported functional ability.  Baseline: to be measured at visit 2 as appropriate (06/10/22); 51 at visit 2 (06/16/2022); 51 at visit 3 (06/30/2022); 49 at visit #10 (07/22/2022); 53 at visit # 20 (08/27/2022);  Goal status: In-progress   3.  Patient will score equal or greater than 25/30 on Functional Gait Assessment to demonstrate low fall risk.  Baseline: to be tested at visit 2 as appropriate (06/10/22); 13/30 high  fall risk (06/16/2022); 15/30 high fall risk (07/02/2022); 16/30 high fall risk (07/22/2022); 22/30 moderate fall risk (08/27/2022);  Goal status: In-progress   4.  Patient will balance in tandem stance equal or greater than 15 seconds with each foot front to show improved static balance and decreased fall risk.  Baseline: R front = 5 seconds, L front = <1 second (06/10/22); R front = 5 seconds, L front = <1 second (06/30/2022); R front = 14 seconds, L front = 3 second (07/22/2022); R front = 13 seconds, L front = 1:14 seconds with left heel slightly lateral (08/27/2022);  Goal status: In progress   5.  Patient will complete community, work and/or recreational activities without limitation due to current condition.  Baseline: balance, household and community mobility, ADLs, transfers, and usual mobility without falling, sleeping (06/10/22); sleeping better, continues to have difficulty with mobility but is using SPC less (07/22/2022); reports it is easier to get up from the  chair when he wants to, he is better able to take the dog out, still having some trouble with the stairs, continues to have limitation from balance (08/27/2022);  Goal status: In-progress     PLAN: PT FREQUENCY: 1-2x/week   PT DURATION: 12 weeks   PLANNED INTERVENTIONS: Therapeutic exercises, Therapeutic activity, Neuromuscular re-education, Balance training, Gait training, Patient/Family education, Joint mobilization, Stair training, DME instructions, Dry Needling, Electrical stimulation, Spinal mobilization, Cryotherapy, Moist heat, Manual therapy, and Re-evaluation.   PLAN FOR NEXT SESSION: balance and ankle exercises, update HEP as appropriate.     Everlean Alstrom. Graylon Good, PT, DPT 08/27/22, 8:15 PM  [facility] [address] [place], [place] [address] P: [phone] I F: [phone]

## 2022-09-01 ENCOUNTER — Encounter: Payer: Self-pay | Admitting: Physical Therapy

## 2022-09-01 ENCOUNTER — Ambulatory Visit: Payer: Medicare Other | Admitting: Physical Therapy

## 2022-09-01 DIAGNOSIS — R262 Difficulty in walking, not elsewhere classified: Secondary | ICD-10-CM

## 2022-09-01 DIAGNOSIS — R2681 Unsteadiness on feet: Secondary | ICD-10-CM

## 2022-09-01 DIAGNOSIS — R208 Other disturbances of skin sensation: Secondary | ICD-10-CM

## 2022-09-01 NOTE — Therapy (Signed)
OUTPATIENT PHYSICAL THERAPY TREATMENT NOTE   Patient Name: Mark Holmes MRN: [id number] DOB:[birth year], 87 y.o., male Today's Date: 09/01/2022  PCP: Ria Bush, MD REFERRING PROVIDER: Ria Bush, MD  END OF SESSION:   PT End of Session - 09/01/22 1122     Visit Number 21    Number of Visits 24    Date for PT Re-Evaluation 11/19/22    Authorization Type [employer] MEDICARE reporting period from 08/27/2022    Progress Note Due on Visit 30    PT Start Time 1122    PT Stop Time 1200    PT Time Calculation (min) 38 min    Equipment Utilized During Treatment Gait belt    Activity Tolerance Patient tolerated treatment well    Behavior During Therapy WFL for tasks assessed/performed             Past Medical History:  Diagnosis Date   Benign prostatic hypertrophy with elevated PSA    per Dr. Jacqlyn Larsen   History of CT scan of brain 1992   normal   Hyperglycemia 01/2006   102   Hyperlipemia    Hypertension    Seizure disorder [facility])    Past Surgical History:  Procedure Laterality Date   ANTERIOR CERVICAL DECOMP/DISCECTOMY FUSION  05/2018   for myelopathy -C3/4 ([place])   CARPAL TUNNEL RELEASE Right 06/2019   ([facility])   CATARACT EXTRACTION W/ INTRAOCULAR LENS IMPLANT Right 2012   [facility]   CATARACT EXTRACTION W/PHACO Left 01/14/2022   Procedure: CATARACT EXTRACTION PHACO AND INTRAOCULAR LENS PLACEMENT ([place]) LEFT;  Surgeon: Leandrew Koyanagi, MD;  Location: [facility];  Service: Ophthalmology;  Laterality: Left;  7.55 01:03.6   History of EEG  1985   normal   hospitalization  1981   observation for seizuare   TONSILLECTOMY     TRANSURETHRAL RESECTION OF PROSTATE  10/2005   [facility]   TRANSURETHRAL RESECTION OF PROSTATE  08/2017   (rpt) [facility]   Patient Active Problem List   Diagnosis Date Noted   Bilateral hearing loss 06/30/2022   Brain aneurysm 06/30/2022   Acute stroke due to ischemia ([place]) 06/20/2022   Nephrotic range  proteinuria 06/04/2022   Hypoproteinemia ([place]) 06/01/2022   Atypical chest pain 07/10/2020   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   Unsteadiness on feet 11/08/2019   History of spinal surgery 01/17/2019   Neck pain 01/17/2019   Weakness of hand 01/17/2019   BPH (benign prostatic hyperplasia) 96/28/3662   Systolic murmur 94/76/5465   Right carpal tunnel syndrome 02/02/2018   Ulnar neuropathy at elbow, left 02/02/2018   Peripheral neuropathy 01/26/2018   Bilateral buttock pain 06/29/2016   Failed vision screen 06/29/2016   Advanced care planning/counseling discussion 06/21/2015   Medicare annual wellness visit, subsequent 06/18/2014   Health maintenance examination 06/18/2014   Chronic ankle pain 06/18/2014   Incomplete emptying of bladder 06/07/2013   Vitamin D deficiency 05/20/2013   THYROID NODULE 05/17/2007   Mixed hyperlipidemia 05/17/2007   Essential tremor 05/17/2007   Essential hypertension 05/17/2007   Cervical spondylosis with myelopathy and radiculopathy 05/17/2007   Seizure disorder ([place]) 05/17/2007    REFERRING DIAG: other polyneuropathy, acute stroke due to ischemia, double vision  THERAPY DIAG:  Difficulty in walking, not elsewhere classified  Unsteadiness on feet  Other disturbances of skin sensation  Rationale for Evaluation and Treatment: Rehabilitation  ONSET DATE: polyneuropathy chronic, CVA/Double vision 06/20/2022  PERTINENT HISTORY: Patient is a 87 y.o. male who presents to outpatient physical therapy with  a referral for medical diagnosis other polyneuropathy, acute stroke due to ischemia, double vision. This patient's chief complaints consist of altered sensation, feeling of tightness in bilateral LE below the knees, R ankle pain, low back pain, and difficulty hearing out of the right ear leading to the following functional deficits: difficulty with balance, household and community mobility, ADLs, transfers, and usual mobility without falling, sleeping. 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    PRECAUTIONS: Fall  SUBJECTIVE: [facility]. He states he is feeling well and has not had any falls since last PT session. He states he feels turning is better. He thinks turning is better .  PAIN: Are you having pain? no  OBJECTIVE    TODAY'S TREATMENT  Therapeutic exercise: to centralize symptoms and improve ROM, strength, muscular endurance, and activity tolerance required for successful completion of functional activities.  - sit <> stand from 17 inch chair to forwards ball slam, ball pick up to stand, then sit. 15# slam ball, 3x12. Seated rest in between.  - standing modified mountain climbers, B UE support on TM bar, cuing to keep forefeet on line about 3 feet behind bar. 3x10 with cuing to engage abdominals. 10# AW on each LE. - ascend/descend 4 six-inch steps with B UE support with discussion of feeling like it is hard to pick up LE to place on step.    Neuromuscular Re-education: to improve, balance, postural strength, muscle  activation patterns, and stabilization strength required for functional activities: - ambulation with 180 turns every 20 feet in hallway, left turns 90 percent of the time, 1x20 turns.  - standing alternating 360 turns tapping hands on wall each time, 1x5 each side. Repeated at TM bar 1x5 each side. (feels dizzy after).  - side stepping on 5 foot arex beam, 2x5 times each direction, CGA, UE support available.  Flexed hips and difficulty looking up.  - cone tip/right 1x10 each side with CGA.   Pt required multimodal cuing for proper technique and to facilitate improved neuromuscular control, strength, range of motion, and functional ability resulting in improved performance and form.   PATIENT EDUCATION:  Education details: Exercise purpose/form. Self management techniques. Education on diagnosis, prognosis, POC, anatomy and physiology of current condition.    Person educated: Patient Education method: ECustomer service managerEducation comprehension: verbalized understanding, demonstrated understandin, and needs further education     HOME EXERCISE PROGRAM: Access Code: [email or web address] URL: [email or web address] Date: 07/09/2022 Prepared by: SRosita Kea Exercises - Half Tandem Stance Balance with Head Rotation  - 2-3 x weekly - 1 sets - 20 reps - Standing Heel Raises  - 1 x daily - 3 sets - 10 reps - Standing Gastroc Stretch at Counter  - 1 x daily -  3 reps - 30 seconds hold - Seated Ankle Eversion with Resistance  - 1 x daily - 3 sets - 10 reps - Seated Heel Raise  - 3 sets - 10 reps - Lateral Inside Outside with Agility Ladder  - Forward Shuffle- Two Feet In, Two Feet Out with Agility Ladder  - Sit to Stand with Arm Reach and Jump  - 3 x weekly - 3 sets - 10 reps - Side Lunge with Counter Support  - 3 x weekly - 2 sets - 20 reps - Standing Alternating Partial Lunge  - 3 x weekly - 2 sets - 10-20 reps  [email or web address]  [[email or web address]]   ANKLE CIRCLES -  Repeat 20 Times,  Hold 1 Second(s), Complete 3 Sets, Perform 1 Times a Day     ASSESSMENT:   CLINICAL IMPRESSION: Patient tolerated treatment well overall. He continues to have some difficulty with R ankle pain and instability that was limiting him some today but is still much improved since getting new brace. He continues to demonstrate wide stance and ataxic gait at times. Overall continues to participate well and is appropriately challenged by interventions. Unclear why he is feeling difficulty with lifting LE to next step while ascending stairs. Included turning practice today to improve confidence and stability while making turns. Patient would benefit from continued management of limiting condition by skilled physical therapist to address remaining impairments and functional limitations to work towards stated goals and return to PLOF or maximal functional independence.   From PT eval on 06/10/2022:  Patient is a 87 y.o. male referred to outpatient physical therapy with a medical diagnosis of other polyneuropathy, acute stroke due to ischemia, double vision who presents with signs and symptoms consistent with imbalance, difficulty with motor control and ambulation consistent with peripheral neuropathy and possible contribution from history of cervical myelopathy. Patient is negative for concordant symptoms with lumbar AROM and lower extremity neurodynamic testing, significantly decreasing likelihood that primary symptoms are from lumbar radiculopathy or sciatica. Patient presents with significant balance, sensory, motor control, postural control, pain, ROM, joint stiffness, muscle performance (strength/power/endurance), knowledge, and activity tolerance impairments that are limiting ability to complete his usual activities such as balance, household and community mobility, ADLs, transfers, and usual mobility without falling, sleeping without difficulty. Patient will benefit from skilled physical therapy intervention to  address current body structure impairments and activity limitations to improve function and work towards goals set in current POC in order to return to prior level of function or maximal functional improvement.      OBJECTIVE IMPAIRMENTS Abnormal gait, decreased activity tolerance, decreased balance, decreased coordination, decreased endurance, decreased knowledge of condition, decreased mobility, difficulty walking, decreased ROM, decreased strength, increased edema, impaired sensation, and pain.    ACTIVITY LIMITATIONS carrying, lifting, bending, sitting, standing, squatting, sleeping, stairs, transfers, bed mobility, dressing, and locomotion level   PARTICIPATION LIMITATIONS: shopping, community activity, yard work, and   Insurance underwriter, household and community mobility, ADLs, transfers, and usual mobility without falling, sleeping.   PERSONAL FACTORS Age, Past/current experiences, Time since onset of injury/illness/exacerbation, and 3+ comorbidities:   acute CVA on 06/20/2022 (MRI showed punctate acute/subacute nonhemorrhagic infarct in the posterior left lower midbrain near the 4th cranial nerve nucleus with remote lacunar infarcts of the right thalamus and right cerebellum),  87m PCOM aneurysm (found incidentally on CTA 06/20/2022, currently undergoing conservative monitoring), concern over dilantin related neuropathy, systolic murmur, benign prostatic hyperplasia with urinary obstruction (followed by urology), chronic R ankle pain (likely due to  post traumatic arthritis), seizure disorder (controlled over many years with dilantin), low back pain, ACDR for myelopathy at C3-C4, 05/2018), cataract surgeries (bilateral), right Carpal tunnel release (06/2019), symptoms of peripheral neuropathy at all limbs (fine motor skills in B UE affected), perfuse degenerative changes on lumbar and cervical spine imaging (see chart for details) are also affecting patient's functional outcome.    REHAB POTENTIAL: Fair patient  previously exhausted improvements in PT and has continued doing HEP regularly since then. Underlying cause of issue (neuropathy) unlikely to be able to be reversed by PT, remote lacunar infarcts on cerebellum found on recent brain MRI suggest chronic deficits in motor control behind ataxic symtptoms.    CLINICAL DECISION MAKING: Stable/uncomplicated   EVALUATION COMPLEXITY: Low     GOALS: Goals reviewed with patient? No   SHORT TERM GOALS: Target date: 06/24/2022   Patient will be independent with initial home exercise program for self-management of symptoms. Baseline: Initial HEP to be provided at visit 2 as appropriate (06/10/22); initial HEP provided at visit 2 (07/16/2022); Goal status: Met     LONG TERM GOALS: Target date: 09/02/2022. UPDATED to 11/19/2022 for all unmet goals on 08/27/2022.    Patient will be independent with a long-term home exercise program for self-management of symptoms.  Baseline: Initial HEP to be provided at visit 2 as appropriate (06/10/22); initial HEP provided at visit 2 (07/16/2022); currently participating well (07/22/2022; 08/27/2022);  Goal status: In-progress   2.  Patient will demonstrate improved FOTO by equal or greater than 10 points to demonstrate improvement in overall condition and self-reported functional ability.  Baseline: to be measured at visit 2 as appropriate (06/10/22); 51 at visit 2 (06/16/2022); 51 at visit 3 (06/30/2022); 49 at visit #10 (07/22/2022); 53 at visit # 20 (08/27/2022);  Goal status: In-progress   3.  Patient will score equal or greater than 25/30 on Functional Gait Assessment to demonstrate low fall risk.  Baseline: to be tested at visit 2 as appropriate (06/10/22); 13/30 high fall risk (06/16/2022); 15/30 high fall risk (07/02/2022); 16/30 high fall risk (07/22/2022); 22/30 moderate fall risk (08/27/2022);  Goal status: In-progress   4.  Patient will balance in tandem stance equal or greater than 15 seconds with each foot front to show  improved static balance and decreased fall risk.  Baseline: R front = 5 seconds, L front = <1 second (06/10/22); R front = 5 seconds, L front = <1 second (06/30/2022); R front = 14 seconds, L front = 3 second (07/22/2022); R front = 13 seconds, L front = 1:14 seconds with left heel slightly lateral (08/27/2022);  Goal status: In progress   5.  Patient will complete community, work and/or recreational activities without limitation due to current condition.  Baseline: balance, household and community mobility, ADLs, transfers, and usual mobility without falling, sleeping (06/10/22); sleeping better, continues to have difficulty with mobility but is using SPC less (07/22/2022); reports it is easier to get up from the chair when he wants to, he is better able to take the dog out, still having some trouble with the stairs, continues to have limitation from balance (08/27/2022);  Goal status: In-progress     PLAN: PT FREQUENCY: 1-2x/week   PT DURATION: 12 weeks   PLANNED INTERVENTIONS: Therapeutic exercises, Therapeutic activity, Neuromuscular re-education, Balance training, Gait training, Patient/Family education, Joint mobilization, Stair training, DME instructions, Dry Needling, Electrical stimulation, Spinal mobilization, Cryotherapy, Moist heat, Manual therapy, and Re-evaluation.   PLAN FOR NEXT SESSION: balance and ankle exercises, update  HEP as appropriate.     Everlean Alstrom. Graylon Good, PT, DPT 09/01/22, 12:14 PM  [facility] [address] [place], [place] [address] P: [phone] I F: [phone]

## 2022-09-03 ENCOUNTER — Encounter: Payer: Self-pay | Admitting: Physical Therapy

## 2022-09-03 ENCOUNTER — Ambulatory Visit: Payer: Medicare Other | Admitting: Physical Therapy

## 2022-09-03 DIAGNOSIS — R208 Other disturbances of skin sensation: Secondary | ICD-10-CM

## 2022-09-03 DIAGNOSIS — R262 Difficulty in walking, not elsewhere classified: Secondary | ICD-10-CM

## 2022-09-03 DIAGNOSIS — R2681 Unsteadiness on feet: Secondary | ICD-10-CM

## 2022-09-03 NOTE — Therapy (Signed)
OUTPATIENT PHYSICAL THERAPY TREATMENT NOTE   Patient Name: Mark Holmes MRN: [id number] DOB:1935/07/19, 87 y.o., male Today's Date: 09/03/2022  PCP: Ria Bush, MD REFERRING PROVIDER: Ria Bush, MD  END OF SESSION:   PT End of Session - 09/03/22 1122     Visit Number 22    Number of Visits 24    Date for PT Re-Evaluation 11/19/22    Authorization Type [employer] MEDICARE reporting period from 08/27/2022    Progress Note Due on Visit 30    PT Start Time 1117    PT Stop Time 1155    PT Time Calculation (min) 38 min    Equipment Utilized During Treatment Gait belt    Activity Tolerance Patient tolerated treatment well    Behavior During Therapy WFL for tasks assessed/performed              Past Medical History:  Diagnosis Date   Benign prostatic hypertrophy with elevated PSA    per Dr. Jacqlyn Larsen   History of CT scan of brain 1992   normal   Hyperglycemia 01/2006   102   Hyperlipemia    Hypertension    Seizure disorder [facility])    Past Surgical History:  Procedure Laterality Date   ANTERIOR CERVICAL DECOMP/DISCECTOMY FUSION  05/2018   for myelopathy -C3/4 ([place] @ [facility])   CARPAL TUNNEL RELEASE Right 06/2019   ([place])   CATARACT EXTRACTION W/ INTRAOCULAR LENS IMPLANT Right 2012   [place] [facility]   CATARACT EXTRACTION W/PHACO Left 01/14/2022   Procedure: CATARACT EXTRACTION PHACO AND INTRAOCULAR LENS PLACEMENT ([place]) LEFT;  Surgeon: Leandrew Koyanagi, MD;  Location: [place];  Service: Ophthalmology;  Laterality: Left;  7.55 01:03.6   History of EEG  1985   normal   hospitalization  1981   observation for seizuare   TONSILLECTOMY     TRANSURETHRAL RESECTION OF PROSTATE  10/2005   [facility]   TRANSURETHRAL RESECTION OF PROSTATE  08/2017   (rpt) [place]   Patient Active Problem List   Diagnosis Date Noted   Bilateral hearing loss 06/30/2022   Brain aneurysm 06/30/2022   Acute stroke due to ischemia ([place]) 06/20/2022   Nephrotic range  proteinuria 06/04/2022   Hypoproteinemia ([place]) 06/01/2022   Atypical chest pain 07/10/2020   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   Unsteadiness on feet 11/08/2019   History of spinal surgery 01/17/2019   Neck pain 01/17/2019   Weakness of hand 01/17/2019   BPH (benign prostatic hyperplasia) 50/02/7047   Systolic murmur 88/91/6945   Right carpal tunnel syndrome 02/02/2018   Ulnar neuropathy at elbow, left 02/02/2018   Peripheral neuropathy 01/26/2018   Bilateral buttock pain 06/29/2016   Failed vision screen 06/29/2016   Advanced care planning/counseling discussion 06/21/2015   Medicare annual wellness visit, subsequent 06/18/2014   Health maintenance examination 06/18/2014   Chronic ankle pain 06/18/2014   Incomplete emptying of bladder 06/07/2013   Vitamin D deficiency 05/20/2013   THYROID NODULE 05/17/2007   Mixed hyperlipidemia 05/17/2007   Essential tremor 05/17/2007   Essential hypertension 05/17/2007   Cervical spondylosis with myelopathy and radiculopathy 05/17/2007   Seizure disorder ([place]) 05/17/2007    REFERRING DIAG: other polyneuropathy, acute stroke due to ischemia, double vision  THERAPY DIAG:  Difficulty in walking, not elsewhere classified  Unsteadiness on feet  Other disturbances of skin sensation  Rationale for Evaluation and Treatment: Rehabilitation  ONSET DATE: polyneuropathy chronic, CVA/Double vision 06/20/2022  PERTINENT HISTORY: Patient is a 87 y.o. male who presents to outpatient physical therapy  with a referral for medical diagnosis other polyneuropathy, acute stroke due to ischemia, double vision. This patient's chief complaints consist of altered sensation, feeling of tightness in bilateral LE below the knees, R ankle pain, low back pain, and difficulty hearing out of the right ear leading to the following functional deficits: difficulty with balance, household and community mobility, ADLs, transfers, and usual mobility without falling, sleeping. 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    PRECAUTIONS: Fall  SUBJECTIVE: [facility]. He denies pain currently or falls since last PT session. He states neuropathy in B lower legs is bothering him. He has been practicing turns in his hallway at home since last PT session.   PAIN: Are you having pain? no  OBJECTIVE    TODAY'S TREATMENT  Therapeutic exercise: to centralize symptoms and improve ROM, strength, muscular endurance, and activity tolerance required for successful completion of functional activities.  - sit <> stand from 17 inch chair to forwards ball slam, ball pick up to stand, then sit. 15# slam ball, 3x12. Seated rest in between.  - standing modified mountain climbers, B UE support on TM bar, cuing to keep forefeet on line about 3 feet behind bar. 3x10 with cuing to engage abdominals. 10# AW on each LE. - education with handouts on how to obtain slam ball and ankle weights to improve patient's HEP for long term use.   Neuromuscular Re-education: to improve, balance,  postural strength, muscle activation patterns, and stabilization strength required for functional activities:  STATIC BALANCE with patient standing with back to corner and chair in front for safety and to teach how to perform at home:  - narrow stance on airex pad, eyes open 1x1 min - self selected stance on airex pad, eyes closed, 3x1 min - self selected stance on airex pad, eyes open, head turns, 3x20 each direction.  - cone tip/right 1x10 each side with CGA.  - Education on HEP including handout   Pt required multimodal cuing for proper technique and to facilitate improved neuromuscular control, strength, range of motion, and functional ability resulting in improved performance and form.   PATIENT EDUCATION:  Education details: Exercise purpose/form. Self management techniques. Education on diagnosis, prognosis, POC, anatomy and physiology of current condition. preparation for discharge.  Person educated: Patient Education method: ECustomer service managerEducation comprehension: verbalized understanding, demonstrated understandin, and needs further education     HOME EXERCISE PROGRAM: Access Code: [email or web address]: [email or web address] Date: 09/03/2022 Prepared by: [place]ty [place] Counter  - 1 x daily - 3 sets - 10 reps - 5 seconds hold - Standing Balance with Eyes Closed on Foam  - 1 x daily - 3 reps - 1 minute hold - Romberg Stance  on Foam Pad with Head Rotation  - 1 x daily - 3 sets - 20 reps  OLD HEP Access Code: [email or web address] URL: [email or web address] Date: 07/09/2022 Prepared by: Rosita Kea  Exercises - Half Tandem Stance Balance with Head Rotation  - 2-3 x weekly - 1 sets - 20 reps - Standing Heel Raises  - 1 x daily - 3 sets - 10 reps - Standing Gastroc Stretch at Counter  - 1 x daily - 3 reps - 30 seconds hold - Seated Ankle Eversion with Resistance  - 1 x daily - 3 sets - 10 reps - Seated Heel Raise  - 3  sets - 10 reps - Lateral Inside Outside with Agility Ladder  - Forward Shuffle- Two Feet In, Two Feet Out with Agility Ladder  - Sit to Stand with Arm Reach and Jump  - 3 x weekly - 3 sets - 10 reps - Side Lunge with Counter Support  - 3 x weekly - 2 sets - 20 reps - Standing Alternating Partial Lunge  - 3 x weekly - 2 sets - 10-20 reps  [email or web address]  [[email or web address]]   ANKLE CIRCLES -  Repeat 20 Times, Hold 1 Second(s), Complete 3 Sets, Perform 1 Times a Day     ASSESSMENT:   CLINICAL IMPRESSION: Patient tolerated treatment well overall. Today's session focused on continued functional strengthening and balance as well as preparation for discharge to updated long term HEP. Patient's HEP updated to include challenging corner balance exercises. He was able to perform them safely despite losing balance frequently to posterior. Plan to continue updating HEP and preparing for discharge while continuing to work on improving patient's balance and functional mobility. Patient would benefit from continued management of limiting condition by skilled physical therapist to address remaining impairments and functional limitations to work towards stated goals and return to PLOF or maximal functional independence.   From PT eval on 06/10/2022:  Patient is a 87 y.o. male referred to outpatient physical therapy with a medical diagnosis of other polyneuropathy, acute stroke due to ischemia, double vision who presents with signs and symptoms consistent with imbalance, difficulty with motor control and ambulation consistent with peripheral neuropathy and possible contribution from history of cervical myelopathy. Patient is negative for concordant symptoms with lumbar AROM and lower extremity neurodynamic testing, significantly decreasing likelihood that primary symptoms are from lumbar radiculopathy or sciatica. Patient presents with significant balance, sensory, motor control, postural control, pain, ROM, joint  stiffness, muscle performance (strength/power/endurance), knowledge, and activity tolerance impairments that are limiting ability to complete his usual activities such as balance, household and community mobility, ADLs, transfers, and usual mobility without falling, sleeping without difficulty. Patient will benefit from skilled physical therapy intervention to address current body structure impairments and activity limitations to improve function and work towards goals set in current POC in order to return to prior level of function or maximal functional improvement.      OBJECTIVE IMPAIRMENTS Abnormal gait, decreased activity tolerance, decreased balance, decreased coordination, decreased endurance, decreased knowledge of condition, decreased mobility, difficulty walking, decreased ROM, decreased strength, increased edema, impaired sensation, and pain.    ACTIVITY LIMITATIONS carrying, lifting, bending, sitting, standing, squatting, sleeping, stairs, transfers, bed mobility, dressing, and locomotion level   PARTICIPATION LIMITATIONS: shopping, community activity, yard work, and   Insurance underwriter, household and community mobility, ADLs, transfers, and usual mobility without falling, sleeping.   PERSONAL FACTORS Age, Past/current experiences, Time since onset of injury/illness/exacerbation, and 3+ comorbidities:   acute CVA on 06/20/2022 (  MRI showed punctate acute/subacute nonhemorrhagic infarct in the posterior left lower midbrain near the 4th cranial nerve nucleus with remote lacunar infarcts of the right thalamus and right cerebellum),  53m PCOM aneurysm (found incidentally on CTA 06/20/2022, currently undergoing conservative monitoring), concern over dilantin related neuropathy, systolic murmur, benign prostatic hyperplasia with urinary obstruction (followed by urology), chronic R ankle pain (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    REHAB POTENTIAL: Fair patient previously exhausted improvements in PT and has continued doing HEP regularly since then. Underlying cause of issue (neuropathy) unlikely to be able to be reversed by PT, remote lacunar infarcts on cerebellum found on recent brain MRI suggest chronic deficits in motor control behind ataxic symtptoms.    CLINICAL DECISION MAKING: Stable/uncomplicated   EVALUATION COMPLEXITY: Low     GOALS: Goals reviewed with patient? No   SHORT TERM GOALS: Target date: 06/24/2022   Patient will be independent with initial home exercise program for self-management of symptoms. Baseline: Initial HEP to be provided at visit 2 as appropriate (06/10/22); initial HEP provided at visit 2 (07/16/2022); Goal status: Met     LONG TERM GOALS: Target date: 09/02/2022. UPDATED to 11/19/2022 for all unmet goals on 08/27/2022.    Patient will be independent with a long-term home exercise program for self-management of symptoms.  Baseline: Initial HEP to be provided at visit 2 as appropriate (06/10/22); initial HEP provided at visit 2 (07/16/2022); currently participating well (07/22/2022; 08/27/2022);  Goal status: In-progress   2.  Patient will demonstrate improved FOTO by equal or greater than 10 points to demonstrate improvement in overall condition and self-reported functional ability.  Baseline: to be measured at visit 2 as appropriate (06/10/22); 51 at visit 2 (06/16/2022); 51 at visit 3 (06/30/2022); 49 at visit #10 (07/22/2022); 53 at visit # 20 (08/27/2022);  Goal status: In-progress   3.  Patient will score equal or greater than 25/30 on Functional Gait Assessment to  demonstrate low fall risk.  Baseline: to be tested at visit 2 as appropriate (06/10/22); 13/30 high fall risk (06/16/2022); 15/30 high fall risk (07/02/2022); 16/30 high fall risk (07/22/2022); 22/30 moderate fall risk (08/27/2022);  Goal status: In-progress   4.  Patient will balance in tandem stance equal or greater than 15 seconds with each foot front to show improved static balance and decreased fall risk.  Baseline: R front = 5 seconds, L front = <1 second (06/10/22); R front = 5 seconds, L front = <1 second (06/30/2022); R front = 14 seconds, L front = 3 second (07/22/2022); R front = 13 seconds, L front = 1:14 seconds with left heel slightly lateral (08/27/2022);  Goal status: In progress   5.  Patient will complete community, work and/or recreational activities without limitation due to current condition.  Baseline: balance, household and community mobility, ADLs, transfers, and usual mobility without falling, sleeping (06/10/22); sleeping better, continues to have difficulty with mobility but is using SPC less (07/22/2022); reports it is easier to get up from the chair when he wants to, he is better able to take the dog out, still having some trouble with the stairs, continues to have limitation from balance (  08/27/2022);  Goal status: In-progress     PLAN: PT FREQUENCY: 1-2x/week   PT DURATION: 12 weeks   PLANNED INTERVENTIONS: Therapeutic exercises, Therapeutic activity, Neuromuscular re-education, Balance training, Gait training, Patient/Family education, Joint mobilization, Stair training, DME instructions, Dry Needling, Electrical stimulation, Spinal mobilization, Cryotherapy, Moist heat, Manual therapy, and Re-evaluation.   PLAN FOR NEXT SESSION: balance and ankle exercises, update HEP as appropriate.     Everlean Alstrom. Graylon Good, PT, DPT 09/03/22, 7:04 PM  [facility] [address] [place], [place] [address] P: [phone] I F: [phone]

## 2022-09-08 ENCOUNTER — Ambulatory Visit: Payer: Medicare Other | Admitting: Physical Therapy

## 2022-09-08 ENCOUNTER — Encounter: Payer: Self-pay | Admitting: Physical Therapy

## 2022-09-08 DIAGNOSIS — R262 Difficulty in walking, not elsewhere classified: Secondary | ICD-10-CM | POA: Diagnosis not present

## 2022-09-08 DIAGNOSIS — R2681 Unsteadiness on feet: Secondary | ICD-10-CM

## 2022-09-08 DIAGNOSIS — R208 Other disturbances of skin sensation: Secondary | ICD-10-CM

## 2022-09-08 NOTE — Therapy (Signed)
OUTPATIENT PHYSICAL THERAPY TREATMENT NOTE   Patient Name: Mark Holmes MRN: [id number] DOB:02-04-1935, 87 y.o., male Today's Date: 09/08/2022  PCP: Ria Bush, MD REFERRING PROVIDER: Ria Bush, MD  END OF SESSION:   PT End of Session - 09/08/22 1519     Visit Number 23    Number of Visits 24    Date for PT Re-Evaluation 11/19/22    Authorization Type [employer] MEDICARE reporting period from 08/27/2022    Progress Note Due on Visit 30    PT Start Time 1517    PT Stop Time 1557    PT Time Calculation (min) 40 min    Equipment Utilized During Treatment Gait belt    Activity Tolerance Patient tolerated treatment well    Behavior During Therapy WFL for tasks assessed/performed               Past Medical History:  Diagnosis Date   Benign prostatic hypertrophy with elevated PSA    per Dr. Jacqlyn Larsen   History of CT scan of brain 1992   normal   Hyperglycemia 01/2006   102   Hyperlipemia    Hypertension    Seizure disorder [facility])    Past Surgical History:  Procedure Laterality Date   ANTERIOR CERVICAL DECOMP/DISCECTOMY FUSION  05/2018   for myelopathy -C3/4 ([place] [facility])   CARPAL TUNNEL RELEASE Right 06/2019   ([place])   CATARACT EXTRACTION W/ INTRAOCULAR LENS IMPLANT Right 2012   [facility]   CATARACT EXTRACTION W/PHACO Left 01/14/2022   Procedure: CATARACT EXTRACTION PHACO AND INTRAOCULAR LENS PLACEMENT ([place]) LEFT;  Surgeon: Leandrew Koyanagi, MD;  Location: [place];  Service: Ophthalmology;  Laterality: Left;  7.55 01:03.6   History of EEG  1985   normal   hospitalization  1981   observation for seizuare   TONSILLECTOMY     TRANSURETHRAL RESECTION OF PROSTATE  10/2005   [facility]   TRANSURETHRAL RESECTION OF PROSTATE  08/2017   (rpt) [facility]   Patient Active Problem List   Diagnosis Date Noted   Bilateral hearing loss 06/30/2022   Brain aneurysm 06/30/2022   Acute stroke due to ischemia ([place]) 06/20/2022   Nephrotic range  proteinuria 06/04/2022   Hypoproteinemia ([place]) 06/01/2022   Atypical chest pain 07/10/2020   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   Unsteadiness on feet 11/08/2019   History of spinal surgery 01/17/2019   Neck pain 01/17/2019   Weakness of hand 01/17/2019   BPH (benign prostatic hyperplasia) 83/15/1761   Systolic murmur 60/73/7106   Right carpal tunnel syndrome 02/02/2018   Ulnar neuropathy at elbow, left 02/02/2018   Peripheral neuropathy 01/26/2018   Bilateral buttock pain 06/29/2016   Failed vision screen 06/29/2016   Advanced care planning/counseling discussion 06/21/2015   Medicare annual wellness visit, subsequent 06/18/2014   Health maintenance examination 06/18/2014   Chronic ankle pain 06/18/2014   Incomplete emptying of bladder 06/07/2013   Vitamin D deficiency 05/20/2013   THYROID NODULE 05/17/2007   Mixed hyperlipidemia 05/17/2007   Essential tremor 05/17/2007   Essential hypertension 05/17/2007   Cervical spondylosis with myelopathy and radiculopathy 05/17/2007   Seizure disorder ([place]) 05/17/2007    REFERRING DIAG: other polyneuropathy, acute stroke due to ischemia, double vision  THERAPY DIAG:  Difficulty in walking, not elsewhere classified  Unsteadiness on feet  Other disturbances of skin sensation  Rationale for Evaluation and Treatment: Rehabilitation  ONSET DATE: polyneuropathy chronic, CVA/Double vision 06/20/2022  PERTINENT HISTORY: Patient is a 87 y.o. male who presents to outpatient physical  therapy with a referral for medical diagnosis other polyneuropathy, acute stroke due to ischemia, double vision. This patient's chief complaints consist of altered sensation, feeling of tightness in bilateral LE below the knees, R ankle pain, low back pain, and difficulty hearing out of the right ear leading to the following functional deficits: difficulty with balance, household and community mobility, ADLs, transfers, and usual mobility without falling, sleeping. 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    PRECAUTIONS: Fall  SUBJECTIVE: [facility]. He denies pain currently or falls since last PT session. He states neuropathy in B lower legs is bothering him. He has been practicing turns in his hallway at home since last PT session. He has been doing his balance exercises in the corner since last PT session. He got the grass planted in his yard he has been wanting to do and fertilized the lawn. Pushing the cart was a little bit difficult. He states he has a new goal: "that you and I will be going dancing with the stars." He saw nephrology today who said the kidney looked good. They drew more blood and did another urine test. Based on the results he may have a kidney biopsy.   PAIN: Are you having pain? no  OBJECTIVE    TODAY'S TREATMENT  Therapeutic exercise: to centralize symptoms and improve ROM, strength, muscular endurance, and activity tolerance required for successful completion of functional activities.  - sit <> stand from 17 inch chair to  forwards ball slam, ball pick up to stand, then sit. 15# slam ball, 3x12. Seated rest in between.  - standing modified mountain climbers, B UE support on TM bar, cuing to keep forefeet on line about 3 feet behind bar. 3x10 with cuing to engage abdominals. 10# AW on each LE. - Education on HEP including handout   Neuromuscular Re-education: to improve, balance, postural strength, muscle activation patterns, and stabilization strength required for functional activities: - cone tip/right 1x10 each side with CGA.  - side stepping on 5 foot arex beam, 2x5 times each direction, CGA, UE support available.  Flexed hips and difficulty looking up.  - 360 turns at TM bar, 1x10 each direction.  - Education on HEP including handout   Pt required multimodal cuing for proper technique and to facilitate improved neuromuscular control, strength, range of motion, and functional ability resulting in improved performance and form.   PATIENT EDUCATION:  Education details: Exercise purpose/form. Self management techniques. Education on diagnosis, prognosis, POC, anatomy and physiology of current condition. preparation for discharge.  Person educated: Patient Education method: ECustomer service managerEducation comprehension: verbalized understanding, demonstrated understandin, and needs further education     HOME EXERCISE PROGRAM: Access Code: [email or web address]: [email or web address] Date: 09/08/2022 Prepared by: SClarise Cruz  [place] on [employer]  - 1 x daily - 3 sets - 10 reps - 5 seconds hold - Standing Balance with Eyes Closed on Foam  - 1 x daily - 3 reps - 1 minute hold - Romberg Stance on Foam Pad with Head Rotation  - 1 x daily - 3 sets - 20 reps - Walking With 180 Degree Turns  - 1 x daily - 2 sets - 20 reps  OLD HEP Access Code: [email or web address] URL: [email or web address] Date: 07/09/2022 Prepared by: Rosita Kea  Exercises - Half Tandem Stance Balance  with Head Rotation  - 2-3 x weekly - 1 sets - 20 reps - Standing Heel Raises  - 1 x daily - 3 sets - 10 reps - Standing Gastroc Stretch at Counter  - 1 x daily - 3 reps - 30 seconds hold - Seated Ankle Eversion with Resistance  - 1 x daily - 3 sets - 10 reps - Seated Heel Raise  - 3 sets - 10 reps - Lateral Inside Outside with Agility Ladder  - Forward Shuffle- Two Feet In, Two Feet Out with Agility Ladder  - Sit to Stand with Arm Reach and Jump  - 3 x weekly - 3 sets - 10 reps - Side Lunge with Counter Support  - 3 x weekly - 2 sets - 20 reps - Standing Alternating Partial Lunge  - 3 x weekly - 2 sets - 10-20 reps  [email or web address] [id number]  [[id number]]   ANKLE CIRCLES -  Repeat 20 Times, Hold 1 Second(s), Complete 3 Sets, Perform 1 Times a Day  HOME EXERCISE PROGRAM [[id number]]  Med Ball Sit to Stand with Slam -  Repeat 12 Times, Complete 3 Sets, Perform 3 Times a Week     ASSESSMENT:   CLINICAL IMPRESSION: Patient tolerated treatment well overall. His right ankle gave way one time but patient was able to prevent fall by grabbing nearby TM bar and with min A from PT. Patient continues to work on improving balance and functional activity tolerance. Updated HEP to help prepare for discharge readiness for discharge. Patient would benefit from continued management of limiting condition by skilled physical therapist to address remaining impairments and functional limitations to work towards stated goals and return to PLOF or maximal functional independence.   From PT eval on 06/10/2022:  Patient is a 87 y.o. male referred to outpatient physical therapy with a medical diagnosis of other polyneuropathy, acute stroke due to ischemia, double vision who presents with signs and symptoms consistent with imbalance, difficulty with motor control and ambulation consistent with peripheral neuropathy and possible contribution from history of cervical myelopathy. Patient is negative for concordant symptoms with lumbar  AROM and lower extremity neurodynamic testing, significantly decreasing likelihood that primary symptoms are from lumbar radiculopathy or sciatica. Patient presents with significant balance, sensory, motor control, postural control, pain, ROM, joint stiffness, muscle performance (strength/power/endurance), knowledge, and activity tolerance impairments that are limiting ability to complete his usual activities such as balance, household and community mobility, ADLs, transfers, and usual mobility without falling, sleeping without difficulty. Patient will benefit from skilled physical therapy intervention to address current body structure impairments and activity limitations to improve function and work towards goals set in current POC in order to return to prior level of function or maximal functional improvement.      OBJECTIVE IMPAIRMENTS Abnormal gait, decreased activity tolerance, decreased balance, decreased coordination, decreased endurance, decreased knowledge of condition, decreased mobility, difficulty walking, decreased ROM, decreased  strength, increased edema, impaired sensation, and pain.    ACTIVITY LIMITATIONS carrying, lifting, bending, sitting, standing, squatting, sleeping, stairs, transfers, bed mobility, dressing, and locomotion level   PARTICIPATION LIMITATIONS: shopping, community activity, yard work, and   Insurance underwriter, household and community mobility, ADLs, transfers, and usual mobility without falling, sleeping.   PERSONAL FACTORS Age, Past/current experiences, Time since onset of injury/illness/exacerbation, and 3+ comorbidities:   acute CVA on 06/20/2022 (MRI showed punctate acute/subacute nonhemorrhagic infarct in the posterior left lower midbrain near the 4th cranial nerve nucleus with remote lacunar infarcts of the right thalamus and right cerebellum),  14m PCOM aneurysm (found incidentally on CTA 06/20/2022, currently undergoing conservative monitoring), concern over dilantin related  neuropathy, systolic murmur, benign prostatic hyperplasia with urinary obstruction (followed by urology), chronic R ankle pain (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    REHAB POTENTIAL: Fair patient previously exhausted improvements in PT and has continued doing HEP regularly since then. Underlying cause of issue (neuropathy) unlikely to be able to be reversed by PT, remote lacunar infarcts on cerebellum found on recent brain MRI suggest chronic deficits in motor control behind ataxic symtptoms.    CLINICAL DECISION MAKING: Stable/uncomplicated   EVALUATION COMPLEXITY: Low     GOALS: Goals reviewed with patient? No   SHORT TERM GOALS: Target date: 06/24/2022   Patient will be independent with initial home exercise program for self-management of symptoms. Baseline: Initial HEP to be provided at visit 2 as appropriate (06/10/22); initial HEP provided at visit 2 (07/16/2022); Goal status: Met     LONG TERM GOALS: Target date: 09/02/2022. UPDATED to 11/19/2022 for all unmet goals on 08/27/2022.    Patient will be independent with a long-term home exercise program for self-management of symptoms.  Baseline: Initial HEP to be provided at visit 2 as appropriate (06/10/22); initial HEP provided at visit 2 (07/16/2022); currently participating well (07/22/2022; 08/27/2022);  Goal status: In-progress   2.  Patient will demonstrate improved FOTO by equal or greater than 10 points to demonstrate improvement in overall condition and self-reported functional ability.  Baseline: to be measured at visit 2 as  appropriate (06/10/22); 51 at visit 2 (06/16/2022); 51 at visit 3 (06/30/2022); 49 at visit #10 (07/22/2022); 53 at visit # 20 (08/27/2022);  Goal status: In-progress   3.  Patient will score equal or greater than 25/30 on Functional Gait Assessment to demonstrate low fall risk.  Baseline: to be tested at visit 2 as appropriate (06/10/22); 13/30 high fall risk (06/16/2022); 15/30 high fall risk (07/02/2022); 16/30 high fall risk (07/22/2022); 22/30 moderate fall risk (08/27/2022);  Goal status: In-progress   4.  Patient will balance in tandem stance equal or greater than 15 seconds with each foot front to show improved static balance and decreased fall risk.  Baseline: R front = 5 seconds, L front = <1 second (06/10/22); R front = 5 seconds, L front = <1 second (06/30/2022); R front = 14 seconds, L front = 3 second (07/22/2022); R front = 13 seconds, L front = 1:14 seconds with left heel slightly lateral (08/27/2022);  Goal status: In progress   5.  Patient will complete community, work and/or recreational  activities without limitation due to current condition.  Baseline: balance, household and community mobility, ADLs, transfers, and usual mobility without falling, sleeping (06/10/22); sleeping better, continues to have difficulty with mobility but is using SPC less (07/22/2022); reports it is easier to get up from the chair when he wants to, he is better able to take the dog out, still having some trouble with the stairs, continues to have limitation from balance (08/27/2022);  Goal status: In-progress     PLAN: PT FREQUENCY: 1-2x/week   PT DURATION: 12 weeks   PLANNED INTERVENTIONS: Therapeutic exercises, Therapeutic activity, Neuromuscular re-education, Balance training, Gait training, Patient/Family education, Joint mobilization, Stair training, DME instructions, Dry Needling, Electrical stimulation, Spinal mobilization, Cryotherapy, Moist heat, Manual therapy, and Re-evaluation.   PLAN FOR NEXT SESSION:  balance and ankle exercises, update HEP as appropriate.     Everlean Alstrom. Graylon Good, PT, DPT 09/08/22, 4:02 PM  [facility] [address] [place], [place] [address] P: [phone] I F: [phone]

## 2022-09-10 ENCOUNTER — Encounter: Payer: Self-pay | Admitting: Physical Therapy

## 2022-09-10 ENCOUNTER — Ambulatory Visit: Payer: Medicare Other | Admitting: Physical Therapy

## 2022-09-10 DIAGNOSIS — R208 Other disturbances of skin sensation: Secondary | ICD-10-CM

## 2022-09-10 DIAGNOSIS — R262 Difficulty in walking, not elsewhere classified: Secondary | ICD-10-CM

## 2022-09-10 DIAGNOSIS — R2681 Unsteadiness on feet: Secondary | ICD-10-CM

## 2022-09-10 NOTE — Therapy (Signed)
OUTPATIENT PHYSICAL THERAPY TREATMENT NOTE   Patient Name: Mark Holmes MRN: [id number] DOB:11-Jul-1935, 87 y.o., male Today's Date: 09/10/2022  PCP: Ria Bush, MD REFERRING PROVIDER: Ria Bush, MD  END OF SESSION:   PT End of Session - 09/10/22 1034     Visit Number 24    Number of Visits 43    Date for PT Re-Evaluation 11/19/22    Authorization Type [employer] MEDICARE reporting period from 08/27/2022    Progress Note Due on Visit 30    PT Start Time 1032    PT Stop Time 1117    PT Time Calculation (min) 45 min    Equipment Utilized During Treatment Gait belt    Activity Tolerance Patient tolerated treatment well    Behavior During Therapy WFL for tasks assessed/performed                Past Medical History:  Diagnosis Date   Benign prostatic hypertrophy with elevated PSA    per Dr. Jacqlyn Larsen   History of CT scan of brain 1992   normal   Hyperglycemia 01/2006   102   Hyperlipemia    Hypertension    Seizure disorder [facility])    Past Surgical History:  Procedure Laterality Date   ANTERIOR CERVICAL DECOMP/DISCECTOMY FUSION  05/2018   for myelopathy -C3/4 ([place] [facility])   CARPAL TUNNEL RELEASE Right 06/2019   ([place])   CATARACT EXTRACTION W/ INTRAOCULAR LENS IMPLANT Right 2012   [facility]   CATARACT EXTRACTION W/PHACO Left 01/14/2022   Procedure: CATARACT EXTRACTION PHACO AND INTRAOCULAR LENS PLACEMENT ([place]) LEFT;  Surgeon: Leandrew Koyanagi, MD;  Location: [place];  Service: Ophthalmology;  Laterality: Left;  7.55 01:03.6   History of EEG  1985   normal   hospitalization  1981   observation for seizuare   TONSILLECTOMY     TRANSURETHRAL RESECTION OF PROSTATE  10/2005   [facility]   TRANSURETHRAL RESECTION OF PROSTATE  08/2017   (rpt) [facility]   Patient Active Problem List   Diagnosis Date Noted   Bilateral hearing loss 06/30/2022   Brain aneurysm 06/30/2022   Acute stroke due to ischemia ([place]) 06/20/2022   Nephrotic range  proteinuria 06/04/2022   Hypoproteinemia ([place]) 06/01/2022   Atypical chest pain 07/10/2020   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   Unsteadiness on feet 11/08/2019   History of spinal surgery 01/17/2019   Neck pain 01/17/2019   Weakness of hand 01/17/2019   BPH (benign prostatic hyperplasia) 97/35/3299   Systolic murmur 24/26/8341   Right carpal tunnel syndrome 02/02/2018   Ulnar neuropathy at elbow, left 02/02/2018   Peripheral neuropathy 01/26/2018   Bilateral buttock pain 06/29/2016   Failed vision screen 06/29/2016   Advanced care planning/counseling discussion 06/21/2015   Medicare annual wellness visit, subsequent 06/18/2014   Health maintenance examination 06/18/2014   Chronic ankle pain 06/18/2014   Incomplete emptying of bladder 06/07/2013   Vitamin D deficiency 05/20/2013   THYROID NODULE 05/17/2007   Mixed hyperlipidemia 05/17/2007   Essential tremor 05/17/2007   Essential hypertension 05/17/2007   Cervical spondylosis with myelopathy and radiculopathy 05/17/2007   Seizure disorder ([place]) 05/17/2007    REFERRING DIAG: other polyneuropathy, acute stroke due to ischemia, double vision  THERAPY DIAG:  Difficulty in walking, not elsewhere classified  Unsteadiness on feet  Other disturbances of skin sensation  Rationale for Evaluation and Treatment: Rehabilitation  ONSET DATE: polyneuropathy chronic, CVA/Double vision 06/20/2022  PERTINENT HISTORY: Patient is a 87 y.o. male who presents to outpatient  physical therapy with a referral for medical diagnosis other polyneuropathy, acute stroke due to ischemia, double vision. This patient's chief complaints consist of altered sensation, feeling of tightness in bilateral LE below the knees, R ankle pain, low back pain, and difficulty hearing out of the right ear leading to the following functional deficits: difficulty with balance, household and community mobility, ADLs, transfers, and usual mobility without falling, sleeping. 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    PRECAUTIONS: Fall  SUBJECTIVE: [facility]. He states he has a little pain in his right ankle. He denies falls since last PT session. He states his balance is doing pretty well but his back gets stiff. He states he felt okay after last PT session.    PAIN: Are you having pain? 2/10  OBJECTIVE    TODAY'S TREATMENT  Therapeutic exercise: to centralize symptoms and improve ROM, strength, muscular endurance, and activity tolerance required for successful completion of functional activities.  - sit <> stand from 17 inch chair to forwards ball slam, ball pick up to stand, then sit. 15# slam ball, 3x12. Seated rest in between.  - standing modified mountain climbers, B UE support on lower stair bar, cuing to keep forefeet on line about 3 feet behind bar. 3x10 with cuing to engage abdominals. 10# AW on each LE.  Neuromuscular Re-education: to improve, balance, postural strength, muscle activation patterns, and stabilization strength required for  functional activities: - lateral ball slam into brick wall with catch on rebound, 1x10 each side with 15# slam ball (difficult, fatigue in B UE and overall).  - lateral ball toss at rebounder, 1x10 each side, 3kg med ball.  - side stepping on 5 foot arex beam, 2x5 times each direction, CGA, UE support available.  Flexed hips and difficulty looking up.  - cone tip/right 1x10 each side with CGA.   Pt required multimodal cuing for proper technique and to facilitate improved neuromuscular control, strength, range of motion, and functional ability resulting in improved performance and form.   PATIENT EDUCATION:  Education details: Exercise purpose/form. Self management techniques. Education on diagnosis, prognosis, POC, anatomy and physiology of current condition. preparation for discharge.  Person educated: Patient Education method: ECustomer service managerEducation comprehension: verbalized understanding, demonstrated understandin, and needs further education     HOME EXERCISE PROGRAM: Access Code: [email or web address]: [email or web address] Date: 09/08/2022 Prepared by: S[place] Counter  - 1 x daily - 3 sets - 10 reps - 5 seconds hold - Standing Balance with Eyes Closed on Foam  - 1 x daily - 3 reps - 1 minute hold - Romberg Stance on Foam Pad with Head Rotation  - 1 x daily - 3 sets - 20 reps -  Walking With 180 Degree Turns  - 1 x daily - 2 sets - 20 reps  OLD HEP Access Code: [email or web address] URL: [email or web address] Date: 07/09/2022 Prepared by: Rosita Kea  Exercises - Half Tandem Stance Balance with Head Rotation  - 2-3 x weekly - 1 sets - 20 reps - Standing Heel Raises  - 1 x daily - 3 sets - 10 reps - Standing Gastroc Stretch at Counter  - 1 x daily - 3 reps - 30 seconds hold - Seated Ankle Eversion with Resistance  - 1 x daily - 3 sets - 10 reps - Seated Heel Raise  - 3 sets - 10 reps - Lateral Inside Outside with  Agility Ladder  - Forward Shuffle- Two Feet In, Two Feet Out with Agility Ladder  - Sit to Stand with Arm Reach and Jump  - 3 x weekly - 3 sets - 10 reps - Side Lunge with Counter Support  - 3 x weekly - 2 sets - 20 reps - Standing Alternating Partial Lunge  - 3 x weekly - 2 sets - 10-20 reps  [email or web address]  [[id number]]   ANKLE CIRCLES -  Repeat 20 Times, Hold 1 Second(s), Complete 3 Sets, Perform 1 Times a Day  HOME EXERCISE PROGRAM [[id number]]  Med Ball Sit to Stand with Slam -  Repeat 12 Times, Complete 3 Sets, Perform 3 Times a Week     ASSESSMENT:   CLINICAL IMPRESSION: Patient tolerated treatment well overall with mild difficulty at times due to R ankle pain and instability. Continued working on core strength, balance, and functional strength. Patient is nearing readiness for discharge which is planned next week. He would benefit from continued review of HEP. Patient would benefit from continued management of limiting condition by skilled physical therapist to address remaining impairments and functional limitations to work towards stated goals and return to PLOF or maximal functional independence.   From PT eval on 06/10/2022:  Patient is a 87 y.o. male referred to outpatient physical therapy with a medical diagnosis of other polyneuropathy, acute stroke due to ischemia, double vision who presents with signs and symptoms consistent with imbalance, difficulty with motor control and ambulation consistent with peripheral neuropathy and possible contribution from history of cervical myelopathy. Patient is negative for concordant symptoms with lumbar AROM and lower extremity neurodynamic testing, significantly decreasing likelihood that primary symptoms are from lumbar radiculopathy or sciatica. Patient presents with significant balance, sensory, motor control, postural control, pain, ROM, joint stiffness, muscle performance (strength/power/endurance), knowledge, and activity tolerance  impairments that are limiting ability to complete his usual activities such as balance, household and community mobility, ADLs, transfers, and usual mobility without falling, sleeping without difficulty. Patient will benefit from skilled physical therapy intervention to address current body structure impairments and activity limitations to improve function and work towards goals set in current POC in order to return to prior level of function or maximal functional improvement.      OBJECTIVE IMPAIRMENTS Abnormal gait, decreased activity tolerance, decreased balance, decreased coordination, decreased endurance, decreased knowledge of condition, decreased mobility, difficulty walking, decreased ROM, decreased strength, increased edema, impaired sensation, and pain.    ACTIVITY LIMITATIONS carrying, lifting, bending, sitting, standing, squatting, sleeping, stairs, transfers, bed mobility, dressing, and locomotion level   PARTICIPATION LIMITATIONS: shopping, community activity, yard work, and   Insurance underwriter, household and community mobility, ADLs, transfers, and usual mobility without falling, sleeping.   PERSONAL FACTORS Age, Past/current experiences, Time since onset of injury/illness/exacerbation, and 3+ comorbidities:   acute  CVA on 06/20/2022 (MRI showed punctate acute/subacute nonhemorrhagic infarct in the posterior left lower midbrain near the 4th cranial nerve nucleus with remote lacunar infarcts of the right thalamus and right cerebellum),  18m PCOM aneurysm (found incidentally on CTA 06/20/2022, currently undergoing conservative monitoring), concern over dilantin related neuropathy, systolic murmur, benign prostatic hyperplasia with urinary obstruction (followed by urology), chronic R ankle pain (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    REHAB POTENTIAL: Fair patient previously exhausted improvements in PT and has continued doing HEP regularly since then. Underlying cause of issue (neuropathy) unlikely to be able to be reversed by PT, remote lacunar infarcts on cerebellum found on recent brain MRI suggest chronic deficits in motor control behind ataxic symtptoms.    CLINICAL DECISION MAKING: Stable/uncomplicated   EVALUATION COMPLEXITY: Low     GOALS: Goals reviewed with patient? No   SHORT TERM GOALS: Target date: 06/24/2022   Patient will be independent with initial home exercise program for self-management of symptoms. Baseline: Initial HEP to be provided at visit 2 as appropriate (06/10/22); initial HEP provided at visit 2 (07/16/2022); Goal status: Met     LONG TERM GOALS: Target date: 09/02/2022. UPDATED to 11/19/2022 for all unmet goals on 08/27/2022.    Patient will be independent with a long-term home exercise program for self-management of symptoms.  Baseline: Initial HEP to be provided at visit 2 as appropriate (06/10/22); initial HEP provided at visit 2 (07/16/2022); currently participating well (07/22/2022; 08/27/2022);  Goal status: In-progress   2.  Patient will demonstrate improved FOTO by equal or greater than 10 points to demonstrate improvement in overall condition and self-reported functional ability.  Baseline: to be measured at visit 2 as appropriate (06/10/22); 51 at visit 2 (06/16/2022); 51 at visit 3 (06/30/2022); 49 at visit #10 (07/22/2022); 53 at visit # 20 (08/27/2022);  Goal status: In-progress   3.  Patient will score equal or greater than 25/30 on Functional Gait Assessment to demonstrate low fall risk.  Baseline: to be tested at visit 2 as appropriate (06/10/22); 13/30  high fall risk (06/16/2022); 15/30 high fall risk (07/02/2022); 16/30 high fall risk (07/22/2022); 22/30 moderate fall risk (08/27/2022);  Goal status: In-progress   4.  Patient will balance in tandem stance equal or greater than 15 seconds with each foot front to show improved static balance and decreased fall risk.  Baseline: R front = 5 seconds, L front = <1 second (06/10/22); R front = 5 seconds, L front = <1 second (06/30/2022); R front = 14 seconds, L front = 3 second (07/22/2022); R front = 13 seconds, L front = 1:14 seconds with left heel slightly lateral (08/27/2022);  Goal status: In progress   5.  Patient will complete community, work and/or recreational activities without limitation due to current condition.  Baseline: balance, household and community mobility, ADLs, transfers, and usual mobility without falling, sleeping (06/10/22); sleeping better, continues to have difficulty with mobility but is using SPC less (07/22/2022); reports it is easier to get up from the chair when he wants to, he is better able to take the dog out, still having some trouble with the stairs, continues to have  limitation from balance (08/27/2022);  Goal status: In-progress     PLAN: PT FREQUENCY: 1-2x/week   PT DURATION: 12 weeks   PLANNED INTERVENTIONS: Therapeutic exercises, Therapeutic activity, Neuromuscular re-education, Balance training, Gait training, Patient/Family education, Joint mobilization, Stair training, DME instructions, Dry Needling, Electrical stimulation, Spinal mobilization, Cryotherapy, Moist heat, Manual therapy, and Re-evaluation.   PLAN FOR NEXT SESSION: balance and ankle exercises, update HEP as appropriate.     Everlean Alstrom. Graylon Good, PT, DPT 09/10/22, 11:57 AM  [facility] [address] [place], [place] [address] P: [phone] I F: [phone]

## 2022-09-15 ENCOUNTER — Encounter: Payer: Self-pay | Admitting: Physical Therapy

## 2022-09-15 ENCOUNTER — Ambulatory Visit: Payer: Medicare Other | Admitting: Physical Therapy

## 2022-09-15 DIAGNOSIS — R208 Other disturbances of skin sensation: Secondary | ICD-10-CM

## 2022-09-15 DIAGNOSIS — R262 Difficulty in walking, not elsewhere classified: Secondary | ICD-10-CM

## 2022-09-15 DIAGNOSIS — R2681 Unsteadiness on feet: Secondary | ICD-10-CM

## 2022-09-15 NOTE — Therapy (Signed)
OUTPATIENT PHYSICAL THERAPY TREATMENT NOTE   Patient Name: Mark Holmes MRN: [id number] DOB:[birth year], 87 y.o., male Today's Date: 09/15/2022  PCP: Ria Bush, MD REFERRING PROVIDER: Ria Bush, MD  END OF SESSION:   PT End of Session - 09/15/22 1103     Visit Number 25    Number of Visits 43    Date for PT Re-Evaluation 11/19/22    Authorization Type [employer] MEDICARE reporting period from 08/27/2022    Progress Note Due on Visit 30    PT Start Time 1030    PT Stop Time 1110    PT Time Calculation (min) 40 min    Equipment Utilized During Treatment Gait belt    Activity Tolerance Patient tolerated treatment well    Behavior During Therapy WFL for tasks assessed/performed                 Past Medical History:  Diagnosis Date   Benign prostatic hypertrophy with elevated PSA    per Dr. Jacqlyn Larsen   History of CT scan of brain 1992   normal   Hyperglycemia 01/2006   102   Hyperlipemia    Hypertension    Seizure disorder [facility])    Past Surgical History:  Procedure Laterality Date   ANTERIOR CERVICAL DECOMP/DISCECTOMY FUSION  05/2018   for myelopathy -C3/4 ([facility])   CARPAL TUNNEL RELEASE Right 06/2019   ([facility])   CATARACT EXTRACTION W/ INTRAOCULAR LENS IMPLANT Right 2012   [facility]   CATARACT EXTRACTION W/PHACO Left 01/14/2022   Procedure: CATARACT EXTRACTION PHACO AND INTRAOCULAR LENS PLACEMENT ([place]) LEFT;  Surgeon: Leandrew Koyanagi, MD;  Location: [facility];  Service: Ophthalmology;  Laterality: Left;  7.55 01:03.6   History of EEG  1985   normal   hospitalization  1981   observation for seizuare   TONSILLECTOMY     TRANSURETHRAL RESECTION OF PROSTATE  10/2005   [facility]   TRANSURETHRAL RESECTION OF PROSTATE  08/2017   (rpt) [facility]   Patient Active Problem List   Diagnosis Date Noted   Bilateral hearing loss 06/30/2022   Brain aneurysm 06/30/2022   Acute stroke due to ischemia ([place]) 06/20/2022   Nephrotic range  proteinuria 06/04/2022   Hypoproteinemia ([place]) 06/01/2022   Atypical chest pain 07/10/2020   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   Unsteadiness on feet 11/08/2019   History of spinal surgery 01/17/2019   Neck pain 01/17/2019   Weakness of hand 01/17/2019   BPH (benign prostatic hyperplasia) 88/82/8003   Systolic murmur 49/17/9150   Right carpal tunnel syndrome 02/02/2018   Ulnar neuropathy at elbow, left 02/02/2018   Peripheral neuropathy 01/26/2018   Bilateral buttock pain 06/29/2016   Failed vision screen 06/29/2016   Advanced care planning/counseling discussion 06/21/2015   Medicare annual wellness visit, subsequent 06/18/2014   Health maintenance examination 06/18/2014   Chronic ankle pain 06/18/2014   Incomplete emptying of bladder 06/07/2013   Vitamin D deficiency 05/20/2013   THYROID NODULE 05/17/2007   Mixed hyperlipidemia 05/17/2007   Essential tremor 05/17/2007   Essential hypertension 05/17/2007   Cervical spondylosis with myelopathy and radiculopathy 05/17/2007   Seizure disorder ([place]) 05/17/2007    REFERRING DIAG: other polyneuropathy, acute stroke due to ischemia, double vision  THERAPY DIAG:  Difficulty in walking, not elsewhere classified  Unsteadiness on feet  Other disturbances of skin sensation  Rationale for Evaluation and Treatment: Rehabilitation  ONSET DATE: polyneuropathy chronic, CVA/Double vision 06/20/2022  PERTINENT HISTORY: Patient is a 87 y.o. male who presents to  outpatient physical therapy with a referral for medical diagnosis other polyneuropathy, acute stroke due to ischemia, double vision. This patient's chief complaints consist of altered sensation, feeling of tightness in bilateral LE below the knees, R ankle pain, low back pain, and difficulty hearing out of the right ear leading to the following functional deficits: difficulty with balance, household and community mobility, ADLs, transfers, and usual mobility without falling, sleeping. 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    PRECAUTIONS: Fall  SUBJECTIVE: [facility]. He is feeling down because this is his last week of PT. He is feeling ready to work on long term HEP. He plans to ask his doctor if he can do more PT in the future. Patient states he felt really confident when he was making his way between people at a busy restaurant since last PT session. He states he feels his balance is doing better. He states he still gets low back pain.   PAIN: Are you having pain? denies  OBJECTIVE    TODAY'S TREATMENT  Therapeutic exercise: to centralize symptoms and improve ROM, strength, muscular endurance, and activity tolerance required for successful completion of functional activities.  - sit <> stand from 17 inch chair to forwards ball slam, ball pick up to stand, then sit. 15# slam ball, 3x12. Seated rest in between. He has not gotten himself a slam ball yet.  - standing modified mountain climbers, B UE support on lower stair bar, cuing to keep forefeet on line about 3  feet behind bar. 3x10. 10# AW on each LE. - education on HEP  Neuromuscular Re-education: to improve, balance, postural strength, muscle activation patterns, and stabilization strength required for functional activities: - cone tip/right 1x10 each side with CGA.  - side stepping on 5 foot arex beam, 1x10 times each direction, CGA, UE support available.  Flexed hips and difficulty looking up.  Pt required multimodal cuing for proper technique and to facilitate improved neuromuscular control, strength, range of motion, and functional ability resulting in improved performance and form.   PATIENT EDUCATION:  Education details: Exercise purpose/form. Self management techniques. Education on diagnosis, prognosis, POC, anatomy and physiology of current condition. preparation for discharge.  Person educated: Patient Education method: ECustomer service managerEducation comprehension: verbalized understanding, demonstrated understandin, and needs further education     HOME EXERCISE PROGRAM: Access Code: [email or web address]: [email or web address] Date: 09/15/2022 Prepared by: [place]on Counter  - 1 x daily - 3 sets - 10 reps - 5 seconds hold - Standing Balance with Eyes Closed on Foam  - 1 x daily - 3 reps - 1 minute hold - Romberg Stance on Foam Pad with Head Rotation  - 1 x daily - 3  sets - 20 reps - Walking With 180 Degree Turns  - 1 x daily - 2 sets - 20 reps  [place] -  Repeat 12 Times, Complete 3 Sets, Perform 3 Times a Week  OLD HEP Access Code: [email or web address] URL: [email or web address] Date: 07/09/2022 Prepared by: Rosita Kea  Exercises - Half Tandem Stance Balance with Head Rotation  - 2-3 x weekly - 1 sets - 20 reps - Standing Heel Raises  - 1 x daily - 3 sets - 10 reps - Standing Gastroc Stretch at Counter  - 1 x daily - 3 reps - 30 seconds hold - Seated Ankle Eversion with  Resistance  - 1 x daily - 3 sets - 10 reps - Seated Heel Raise  - 3 sets - 10 reps - Lateral Inside Outside with Agility Ladder  - Forward Shuffle- Two Feet In, Two Feet Out with Agility Ladder  - Sit to Stand with Arm Reach and Jump  - 3 x weekly - 3 sets - 10 reps - Side Lunge with Counter Support  - 3 x weekly - 2 sets - 20 reps - Standing Alternating Partial Lunge  - 3 x weekly - 2 sets - 10-20 reps  [email or web address]  [[id number]]   ANKLE CIRCLES -  Repeat 20 Times, Hold 1 Second(s), Complete 3 Sets, Perform 1 Times a Day  HOME EXERCISE PROGRAM [[id number]]  Med Ball Sit to Stand with Slam -  Repeat 12 Times, Complete 3 Sets, Perform 3 Times a Week     ASSESSMENT:   CLINICAL IMPRESSION: Patient tolerated treatment well overall with some limitations due to R ankle instability and pain. He also complained of some low back pain this session, mostly with prolonged standing during balance exercises where he was slightly bent forwards for balance and to see floor. Plan to discharge to long term HEP next session due to improvements in condition. Patient would benefit from continued management of limiting condition by skilled physical therapist to address remaining impairments and functional limitations to work towards stated goals and return to PLOF or maximal functional independence.   From PT eval on 06/10/2022:  Patient is a 87 y.o. male referred to outpatient physical therapy with a medical diagnosis of other polyneuropathy, acute stroke due to ischemia, double vision who presents with signs and symptoms consistent with imbalance, difficulty with motor control and ambulation consistent with peripheral neuropathy and possible contribution from history of cervical myelopathy. Patient is negative for concordant symptoms with lumbar AROM and lower extremity neurodynamic testing, significantly decreasing likelihood that primary symptoms are from lumbar radiculopathy or sciatica. Patient presents with  significant balance, sensory, motor control, postural control, pain, ROM, joint stiffness, muscle performance (strength/power/endurance), knowledge, and activity tolerance impairments that are limiting ability to complete his usual activities such as balance, household and community mobility, ADLs, transfers, and usual mobility without falling, sleeping without difficulty. Patient will benefit from skilled physical therapy intervention to address current body structure impairments and activity limitations to improve function and work towards goals set in current POC in order to return to prior level of function or maximal functional improvement.      OBJECTIVE IMPAIRMENTS Abnormal gait, decreased activity tolerance, decreased balance, decreased coordination, decreased endurance, decreased knowledge of condition, decreased mobility, difficulty walking, decreased ROM, decreased strength, increased edema, impaired sensation, and pain.    ACTIVITY LIMITATIONS carrying, lifting, bending, sitting, standing, squatting, sleeping, stairs, transfers, bed mobility, dressing, and locomotion level   PARTICIPATION  LIMITATIONS: shopping, community activity, yard work, and   Insurance underwriter, household and community mobility, ADLs, transfers, and usual mobility without falling, sleeping.   PERSONAL FACTORS Age, Past/current experiences, Time since onset of injury/illness/exacerbation, and 3+ comorbidities:   acute CVA on 06/20/2022 (MRI showed punctate acute/subacute nonhemorrhagic infarct in the posterior left lower midbrain near the 4th cranial nerve nucleus with remote lacunar infarcts of the right thalamus and right cerebellum),  26m PCOM aneurysm (found incidentally on CTA 06/20/2022, currently undergoing conservative monitoring), concern over dilantin related neuropathy, systolic murmur, benign prostatic hyperplasia with urinary obstruction (followed by urology), chronic R ankle pain (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    REHAB POTENTIAL: Fair patient previously exhausted improvements in PT and has continued doing HEP regularly since then. Underlying cause of issue (neuropathy) unlikely to be able to be reversed by PT, remote lacunar infarcts on cerebellum found on recent brain MRI suggest chronic deficits in motor control behind ataxic symtptoms.    CLINICAL DECISION MAKING: Stable/uncomplicated   EVALUATION COMPLEXITY: Low     GOALS: Goals reviewed with patient? No   SHORT TERM GOALS: Target date: 06/24/2022   Patient will be independent with initial home exercise program for self-management of symptoms. Baseline: Initial HEP to be provided at visit 2 as appropriate (06/10/22); initial HEP provided at visit 2 (07/16/2022); Goal status: Met     LONG TERM GOALS: Target date: 09/02/2022. UPDATED to 11/19/2022 for all unmet goals on 08/27/2022.    Patient will be independent with a long-term home exercise program for self-management of symptoms.  Baseline: Initial HEP to be provided at visit 2 as appropriate (06/10/22); initial HEP provided at visit 2 (07/16/2022); currently participating well (07/22/2022; 08/27/2022);  Goal status: In-progress   2.  Patient will demonstrate improved FOTO by equal or greater than 10 points to demonstrate improvement in overall condition and self-reported functional ability.  Baseline: to be measured at visit 2 as appropriate (06/10/22); 51 at visit 2 (06/16/2022); 51 at visit 3 (06/30/2022); 49 at visit #10 (07/22/2022); 53 at visit # 20 (08/27/2022);  Goal status: In-progress   3.   Patient will score equal or greater than 25/30 on Functional Gait Assessment to demonstrate low fall risk.  Baseline: to be tested at visit 2 as appropriate (06/10/22); 13/30 high fall risk (06/16/2022); 15/30 high fall risk (07/02/2022); 16/30 high fall risk (07/22/2022); 22/30 moderate fall risk (08/27/2022);  Goal status: In-progress   4.  Patient will balance in tandem stance equal or greater than 15 seconds with each foot front to show improved static balance and decreased fall risk.  Baseline: R front = 5 seconds, L front = <1 second (06/10/22); R front = 5 seconds, L front = <1 second (06/30/2022); R front = 14 seconds, L front = 3 second (07/22/2022); R front = 13 seconds, L front = 1:14 seconds with left heel slightly lateral (08/27/2022);  Goal status: In progress   5.  Patient will complete community, work and/or recreational activities without limitation due to current condition.  Baseline: balance, household and community mobility, ADLs, transfers, and usual mobility without falling, sleeping (06/10/22); sleeping better, continues to have difficulty with  mobility but is using SPC less (07/22/2022); reports it is easier to get up from the chair when he wants to, he is better able to take the dog out, still having some trouble with the stairs, continues to have limitation from balance (08/27/2022);  Goal status: In-progress     PLAN: PT FREQUENCY: 1-2x/week   PT DURATION: 12 weeks   PLANNED INTERVENTIONS: Therapeutic exercises, Therapeutic activity, Neuromuscular re-education, Balance training, Gait training, Patient/Family education, Joint mobilization, Stair training, DME instructions, Dry Needling, Electrical stimulation, Spinal mobilization, Cryotherapy, Moist heat, Manual therapy, and Re-evaluation.   PLAN FOR NEXT SESSION: balance and ankle exercises, update HEP as appropriate.     Everlean Alstrom. Graylon Good, PT, DPT 09/15/22, 5:24 PM  [facility] [address] [place], [place] [address] P: [phone] I F: [phone]

## 2022-09-17 ENCOUNTER — Ambulatory Visit: Payer: Medicare Other | Admitting: Physical Therapy

## 2022-09-17 ENCOUNTER — Encounter: Payer: Self-pay | Admitting: Physical Therapy

## 2022-09-17 DIAGNOSIS — R262 Difficulty in walking, not elsewhere classified: Secondary | ICD-10-CM

## 2022-09-17 DIAGNOSIS — R208 Other disturbances of skin sensation: Secondary | ICD-10-CM

## 2022-09-17 DIAGNOSIS — R2681 Unsteadiness on feet: Secondary | ICD-10-CM

## 2022-09-17 NOTE — Therapy (Signed)
OUTPATIENT PHYSICAL THERAPY TREATMENT NOTE / DISCHARGE SUMMARY Dates of reporting from 06/10/2022 to 08/17/2022   Patient Name: Mark Holmes MRN: [id number] DOB:1935/06/13, 87 y.o., male Today's Date: 09/17/2022  PCP: Ria Bush, MD REFERRING PROVIDER: Ria Bush, MD  END OF SESSION:   PT End of Session - 09/17/22 1926     Visit Number 26    Number of Visits 43    Date for PT Re-Evaluation 11/19/22    Authorization Type [employer] MEDICARE reporting period from 08/27/2022    Progress Note Due on Visit 30    PT Start Time 1035    PT Stop Time 1115    PT Time Calculation (min) 40 min    Equipment Utilized During Treatment Gait belt    Activity Tolerance Patient tolerated treatment well    Behavior During Therapy WFL for tasks assessed/performed               Past Medical History:  Diagnosis Date   Benign prostatic hypertrophy with elevated PSA    per Dr. Jacqlyn Larsen   History of CT scan of brain 1992   normal   Hyperglycemia 01/2006   102   Hyperlipemia    Hypertension    Seizure disorder [facility])    Past Surgical History:  Procedure Laterality Date   ANTERIOR CERVICAL DECOMP/DISCECTOMY FUSION  05/2018   for myelopathy -C3/4 ([place] [facility])   CARPAL TUNNEL RELEASE Right 06/2019   ([place])   CATARACT EXTRACTION W/ INTRAOCULAR LENS IMPLANT Right 2012   [facility]   CATARACT EXTRACTION W/PHACO Left 01/14/2022   Procedure: CATARACT EXTRACTION PHACO AND INTRAOCULAR LENS PLACEMENT ([place]) LEFT;  Surgeon: Leandrew Koyanagi, MD;  Location: [place];  Service: Ophthalmology;  Laterality: Left;  7.55 01:03.6   History of EEG  1985   normal   hospitalization  1981   observation for seizuare   TONSILLECTOMY     TRANSURETHRAL RESECTION OF PROSTATE  10/2005   [facility]   TRANSURETHRAL RESECTION OF PROSTATE  08/2017   (rpt) [facility]   Patient Active Problem List   Diagnosis Date Noted   Bilateral hearing loss 06/30/2022   Brain aneurysm 06/30/2022    Acute stroke due to ischemia ([place]) 06/20/2022   Nephrotic range proteinuria 06/04/2022   Hypoproteinemia ([place]) 06/01/2022   Atypical chest pain 07/10/2020   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   Unsteadiness on feet 11/08/2019   History of spinal surgery 01/17/2019   Neck pain 01/17/2019   Weakness of hand 01/17/2019   BPH (benign prostatic hyperplasia) 82/42/3536   Systolic murmur 14/43/1540   Right carpal tunnel syndrome 02/02/2018   Ulnar neuropathy at elbow, left 02/02/2018   Peripheral neuropathy 01/26/2018   Bilateral buttock pain 06/29/2016   Failed vision screen 06/29/2016   Advanced care planning/counseling discussion 06/21/2015   Medicare annual wellness visit, subsequent 06/18/2014   Health maintenance examination 06/18/2014   Chronic ankle pain 06/18/2014   Incomplete emptying of bladder 06/07/2013   Vitamin D deficiency 05/20/2013   THYROID NODULE 05/17/2007   Mixed hyperlipidemia 05/17/2007   Essential tremor 05/17/2007   Essential hypertension 05/17/2007   Cervical spondylosis with myelopathy and radiculopathy 05/17/2007   Seizure disorder ([place]) 05/17/2007    REFERRING DIAG: other polyneuropathy, acute stroke due to ischemia, double vision  THERAPY DIAG:  Difficulty in walking, not elsewhere classified  Unsteadiness on feet  Other disturbances of skin sensation  Rationale for Evaluation and Treatment: Rehabilitation  ONSET DATE: polyneuropathy chronic, CVA/Double vision 06/20/2022  PERTINENT HISTORY: Patient  is a 87 y.o. male who presents to outpatient physical therapy with a referral for medical diagnosis other polyneuropathy, acute stroke due to ischemia, double vision. This patient's chief complaints consist of altered sensation, feeling of tightness in bilateral LE below the knees, R ankle pain, low back pain, and difficulty hearing out of the right ear leading to the following functional deficits: difficulty with balance, household and community mobility, ADLs,  transfers, and usual mobility without falling, sleeping. 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    PRECAUTIONS: Fall  SUBJECTIVE: [facility] He brought an eHealth and safety inspectorand organizing his HEP and has some questions about it. He expects to discharge to long term HEP today. He states he feels like his balance has improved and he feels a lot more confident being in settings where a lot of people are close to him, such as at a busy restaurant recently.   PAIN: Are you having pain? denies  OBJECTIVE  SELF-REPORTED FUNCTION FOTO score: 52/100 (balance questionnaire)  FUNCTIONAL/BALANCE TESTS:   -Tandem stance, eyes open (best of 3 trials): R front = 4 seconds, L front = 6 seconds    [facility] PT Assessment - 09/17/22 0001       Functional Gait  Assessment   Gait Level Surface Walks 20 ft in less than 5.5 sec, no assistive devices, good speed, no evidence for imbalance, normal gait pattern, deviates no more than 6 in outside of the 12 in walkway width.   self selected pace:  6.75 seconds; fast pace: 4.56 seconds   Change in Gait Speed Able to smoothly change walking speed without loss of balance or gait deviation. Deviate no more than 6 in outside of the 12 in walkway width.    Gait with Horizontal Head Turns Performs head turns smoothly with no change in gait. Deviates no more than 6 in outside 12 in walkway width    Gait with Vertical Head Turns Performs head turns with no change in gait. Deviates no more than 6 in outside 12 in walkway width.    Gait and Pivot Turn Pivot turns safely in greater than 3 sec and stops with no loss of balance, or pivot turns safely within 3 sec and stops with mild imbalance, requires small steps to catch balance.    Step Over Obstacle Is able to step over one shoe box (4.5 in total height) without changing gait speed. No evidence of imbalance.    Gait with Narrow Base of Support Ambulates 4-7 steps.    Gait with Eyes Closed Walks 20 ft, slow speed, abnormal gait pattern, evidence for imbalance, deviates 10-15 in outside 12 in walkway width. Requires more than 9 sec to ambulate 20 ft.    Ambulating Backwards  Walks 20 ft, uses assistive device, slower speed, mild gait deviations, deviates 6-10 in outside 12 in walkway width.    Steps Alternating feet, must use rail.    Total Score 22    FGA comment: 19-24 = medium risk fall, no AD used              TODAY'S TREATMENT  Neuromuscular Re-education: to improve, balance, postural strength, muscle activation patterns, and stabilization strength required for functional activities: - standing half-tandem stance on airex pad, 1x1 min each side.  - sit <> stand from 17 inch chair to forwards ball slam, ball pick up to stand, then sit. 15# slam ball, 1x12.  - review of long term HEP - static balance testing and FGA testing to assess progress at discharge (see above).   Pt required multimodal cuing for proper technique and to facilitate improved neuromuscular control, strength, range of  motion, and functional ability resulting in improved performance and form.   PATIENT EDUCATION:  Education details: Exercise purpose/form. Self management techniques. Education on diagnosis, prognosis, POC, anatomy and physiology of current condition. recommendations at discharge.   Person educated: Patient Education method: Customer service manager Education comprehension: verbalized understanding, demonstrated understanding     HOME EXERCISE PROGRAM: Access Code: [email or web address] URL: [email or web address] Date: 09/17/2022 Prepared by: Rosita Kea  Exercises - Half Tandem Stance on Foam Pad  - 1 x daily - 2 reps - 1 min hold - Standing Balance with Eyes Closed on Foam  - 1 x daily - 3 reps - 1 minute hold - Romberg Stance on Foam Pad with Head Rotation  - 1 x daily - 3 sets - 20 reps - Mountain Climber on Counter  - 3 x weekly - 3 sets - 10 reps - 5 seconds hold - Walking With 180 Degree Turns  - 3 x weekly - 2 sets - 20 reps - 360 Degree Turn in Both Directions  - 3 x weekly - 1 sets - 10 reps  HOME EXERCISE PROGRAM [[id number]]  Med [employer] -  Repeat 12 Times, Complete 3 Sets, Perform 3 Times a Week  OLD HEP Access Code: [email or web address] URL: [email or web address] Date: 09/17/2022 Prepared by: Rosita Kea  Exercises - Standing Heel Raises  - 1 x daily - 3 sets - 10 reps - Standing Gastroc Stretch at Counter  - 1 x daily - 3 reps - 30 seconds hold - Seated Ankle Eversion with Resistance  - 1 x daily - 3 sets - 10 reps - Seated Heel Raise  - 3 sets - 10 reps - Lateral Inside Outside with Agility Ladder  - Forward Shuffle- Two Feet In, Two Feet Out with Agility Ladder  - Side Lunge with Counter Support  - 3 x weekly - 2 sets - 20 reps - Standing Alternating Partial Lunge  - 3 x weekly - 2 sets - 10-20 reps  [id number]  [[id number]]   ANKLE CIRCLES -  Repeat 20 Times, Hold 1 Second(s), Complete 3 Sets, Perform 1 Times a Day  HOME EXERCISE PROGRAM  [[id number]]  Med Ball Sit to Stand with Slam -  Repeat 12 Times, Complete 3 Sets, Perform 3 Times a Week     ASSESSMENT:   CLINICAL IMPRESSION: Patient has attended 26 physical therapy sessions since starting current episode of care on 06/10/2022. He has made good progress towards most of his goals and appears to have reached maximum benefit from PT at this time. He appears ready and able to discharge to  long term HEP to continue long term independent management of current condition. He is now discharged from PT.    OBJECTIVE IMPAIRMENTS Abnormal gait, decreased activity tolerance, decreased balance, decreased coordination, decreased endurance, decreased knowledge of condition, decreased mobility, difficulty walking, decreased ROM, decreased strength, increased edema, impaired sensation, and pain.    ACTIVITY LIMITATIONS carrying, lifting, bending, sitting, standing, squatting, sleeping, stairs, transfers, bed mobility, dressing, and locomotion level   PARTICIPATION LIMITATIONS: shopping, community activity, yard work, and   Insurance underwriter, household and community mobility, ADLs, transfers, and usual mobility without falling, sleeping.   PERSONAL FACTORS Age, Past/current experiences, Time since onset of injury/illness/exacerbation, and 3+ comorbidities:   acute CVA on 06/20/2022 (MRI showed punctate acute/subacute nonhemorrhagic infarct in the posterior left lower midbrain near the 4th cranial nerve nucleus with remote lacunar infarcts of the right thalamus and right cerebellum),  65m PCOM aneurysm (found incidentally on CTA 06/20/2022, currently undergoing conservative monitoring), concern over dilantin related neuropathy, systolic murmur, benign prostatic hyperplasia with urinary obstruction (followed by urology), chronic R ankle pain (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    REHAB POTENTIAL: Fair patient previously exhausted improvements in PT and has continued doing HEP regularly since then. Underlying cause of issue (neuropathy) unlikely to be able to be reversed by PT, remote lacunar infarcts on cerebellum found on recent brain MRI suggest chronic deficits in motor control behind ataxic symtptoms.    CLINICAL DECISION MAKING: Stable/uncomplicated   EVALUATION COMPLEXITY: Low     GOALS: Goals reviewed with patient? No   SHORT TERM GOALS: Target date: 06/24/2022   Patient will be independent with initial home exercise program for self-management of symptoms. Baseline: Initial HEP to be provided at visit 2 as appropriate (06/10/22); initial HEP provided at visit 2 (07/16/2022); Goal status: Met     LONG TERM GOALS: Target date: 09/02/2022. UPDATED to 11/19/2022 for all unmet goals on 08/27/2022.    Patient will be independent with a long-term home exercise program for self-management of symptoms.  Baseline: Initial HEP to be provided at visit 2 as appropriate (06/10/22); initial HEP provided at visit 2 (07/16/2022); currently participating well (07/22/2022; 08/27/2022; 09/17/2022);  Goal status: MET 09/17/2022   2.  Patient will demonstrate improved FOTO by equal or greater than 10 points to demonstrate improvement in overall condition and self-reported functional ability.  Baseline: to be measured at visit 2 as appropriate (06/10/22); 51 at visit 2 (06/16/2022); 51 at visit 3 (06/30/2022); 49 at visit #10 (07/22/2022); 53 at visit # 20 (08/27/2022); 52 at visit # 26 (09/17/2022); Goal status: not met   3.  Patient will score equal or greater than 25/30 on Functional Gait Assessment to demonstrate low  fall risk.  Baseline: to be tested at visit 2 as appropriate (06/10/22); 13/30 high fall risk (06/16/2022); 15/30 high fall risk (07/02/2022); 16/30 high fall risk (07/22/2022); 22/30 moderate fall risk (08/27/2022; 09/17/2022);  Goal status: Partially met   4.  Patient will balance in tandem stance equal or greater than 15 seconds with each foot front to show improved static balance and decreased fall risk.  Baseline: R front = 5 seconds, L front = <1 second (  06/10/22); R front = 5 seconds, L front = <1 second (06/30/2022); R front = 14 seconds, L front = 3 second (07/22/2022); R front = 13 seconds, L front = 1:14 seconds with left heel slightly lateral (08/27/2022); R front = 4 seconds, L front = 6 seconds (09/17/2022);  Goal status: partially met    5.  Patient will complete community, work and/or recreational activities without limitation due to current condition.  Baseline: balance, household and community mobility, ADLs, transfers, and usual mobility without falling, sleeping (06/10/22); sleeping better, continues to have difficulty with mobility but is using SPC less (07/22/2022); reports it is easier to get up from the chair when he wants to, he is better able to take the dog out, still having some trouble with the stairs, continues to have limitation from balance (08/27/2022); reports he is doing most things he wants to, still is bothered by pain and difficulty with balance at times (09/17/2022);  Goal status: Nearly met     PLAN: PT FREQUENCY: 1-2x/week   PT DURATION: 12 weeks   PLANNED INTERVENTIONS: Therapeutic exercises, Therapeutic activity, Neuromuscular re-education, Balance training, Gait training, Patient/Family education, Joint mobilization, Stair training, DME instructions, Dry Needling, Electrical stimulation, Spinal mobilization, Cryotherapy, Moist heat, Manual therapy, and Re-evaluation.   PLAN FOR NEXT SESSION: Patient is now discharged to long term HEP.     Everlean Alstrom. Graylon Good, PT,  DPT 09/17/22, 8:40 PM  [facility] [address] [place], [place] [address] P: [phone] I F: [phone]

## 2022-09-21 ENCOUNTER — Encounter: Payer: Medicare Other | Admitting: Physical Therapy

## 2022-09-28 ENCOUNTER — Encounter: Payer: Medicare Other | Admitting: Physical Therapy

## 2022-09-30 ENCOUNTER — Encounter: Payer: Medicare Other | Admitting: Physical Therapy

## 2022-10-05 ENCOUNTER — Other Ambulatory Visit: Payer: Self-pay | Admitting: Family Medicine

## 2022-10-05 NOTE — Telephone Encounter (Signed)
  Encourage patient to contact the pharmacy for refills or they can request refills through [facility]:  Please schedule appointment if longer than 1 year  NEXT APPOINTMENT DATE:  MEDICATION:phenytoin (DILANTIN) 100 MG ER capsule  Is the patient out of medication?   [place]  Let patient know to contact pharmacy at the end of the day to make sure medication is ready.  Please notify patient to allow 48-72 hours to process  CLINICAL FILLS OUT ALL BELOW:   LAST REFILL:  QTY:  REFILL DATE:    OTHER COMMENTS:    Okay for refill?  Please advise

## 2022-10-05 NOTE — Telephone Encounter (Signed)
Last refill 07/27/22#120 Last office visit 06/29/22 Upcoming appointment 11/02/22

## 2022-10-07 ENCOUNTER — Telehealth: Payer: Self-pay | Admitting: Family Medicine

## 2022-10-07 MED ORDER — PHENYTOIN SODIUM EXTENDED 100 MG PO CAPS: 200.0000 mg | ORAL_CAPSULE | Freq: Two times a day (BID) | ORAL | 0 refills | Status: DC

## 2022-10-08 NOTE — Telephone Encounter (Addendum)
Message from pt via pharmacy:  **Patient requests 90 days supply**  

## 2022-10-09 MED ORDER — PHENYTOIN SODIUM EXTENDED 100 MG PO CAPS
200.0000 mg | ORAL_CAPSULE | Freq: Two times a day (BID) | ORAL | 0 refills | Status: DC
Start: 2022-10-09 — End: 2022-12-27

## 2022-10-09 NOTE — Telephone Encounter (Signed)
Plz notify this was sent tomail order.

## 2022-10-09 NOTE — Telephone Encounter (Signed)
Pt called requesting a call back stated pharmacy did not  get approval for RX [facility]. Please advise #[phone]

## 2022-10-09 NOTE — Telephone Encounter (Signed)
Spoke with pt notifying him Dr. Darnell Level sent 90-day rx today to [facility].  Pt expresses his thanks.

## 2022-10-09 NOTE — Telephone Encounter (Signed)
Patient called and stated that the pharmacy tried to contact Dr. Darnell Level but he hasn't got to him yet and it is through [facility]. Call back number [phone].

## 2022-10-16 ENCOUNTER — Other Ambulatory Visit: Payer: Self-pay | Admitting: Nephrology

## 2022-10-16 DIAGNOSIS — R809 Proteinuria, unspecified: Secondary | ICD-10-CM

## 2022-10-16 NOTE — Progress Notes (Signed)
Patient for US guided Renal Biopsy on Wed 10/28/22, I called and spoke with the patient on the phone and gave pre-procedure instructions. Pt was made aware to be here at [facility] at the new entrance, last dose of ASA and Excedrin Migraine is Thurs 10/22/22, NPO after MN prior to procedure as well as driver post procedure/recovery/discharge. Pt stated understanding. Called 10/16/22

## 2022-10-25 ENCOUNTER — Other Ambulatory Visit: Payer: Self-pay | Admitting: Family Medicine

## 2022-10-25 DIAGNOSIS — I10 Essential (primary) hypertension: Secondary | ICD-10-CM

## 2022-10-25 DIAGNOSIS — E559 Vitamin D deficiency, unspecified: Secondary | ICD-10-CM

## 2022-10-25 DIAGNOSIS — E782 Mixed hyperlipidemia: Secondary | ICD-10-CM

## 2022-10-25 NOTE — Addendum Note (Signed)
Addended by: Ria Bush on: 10/25/2022 09:10 PM   Modules accepted: Orders

## 2022-10-26 ENCOUNTER — Other Ambulatory Visit (INDEPENDENT_AMBULATORY_CARE_PROVIDER_SITE_OTHER): Payer: Medicare Other

## 2022-10-26 DIAGNOSIS — E782 Mixed hyperlipidemia: Secondary | ICD-10-CM | POA: Diagnosis not present

## 2022-10-26 DIAGNOSIS — E559 Vitamin D deficiency, unspecified: Secondary | ICD-10-CM | POA: Diagnosis not present

## 2022-10-26 DIAGNOSIS — I10 Essential (primary) hypertension: Secondary | ICD-10-CM

## 2022-10-26 LAB — CBC WITH DIFFERENTIAL/PLATELET
Basophils Absolute: 0.1 10*3/uL (ref 0.0–0.1)
Basophils Relative: 1.2 % (ref 0.0–3.0)
Eosinophils Absolute: 0.2 10*3/uL (ref 0.0–0.7)
Eosinophils Relative: 1.9 % (ref 0.0–5.0)
HCT: 33.4 % — ABNORMAL LOW (ref 39.0–52.0)
Hemoglobin: 11.3 g/dL — ABNORMAL LOW (ref 13.0–17.0)
Lymphocytes Relative: 15 % (ref 12.0–46.0)
Lymphs Abs: 1.4 10*3/uL (ref 0.7–4.0)
MCHC: 33.8 g/dL (ref 30.0–36.0)
MCV: 88 fl (ref 78.0–100.0)
Monocytes Absolute: 0.7 10*3/uL (ref 0.1–1.0)
Monocytes Relative: 7.2 % (ref 3.0–12.0)
Neutro Abs: 6.9 10*3/uL (ref 1.4–7.7)
Neutrophils Relative %: 74.7 % (ref 43.0–77.0)
Platelets: 271 10*3/uL (ref 150.0–400.0)
RBC: 3.79 Mil/uL — ABNORMAL LOW (ref 4.22–5.81)
RDW: 13.9 % (ref 11.5–15.5)
WBC: 9.3 10*3/uL (ref 4.0–10.5)

## 2022-10-26 LAB — LIPID PANEL
Cholesterol: 126 mg/dL (ref 0–200)
HDL: 48.9 mg/dL (ref >39.00)
LDL Cholesterol: 59 mg/dL (ref 0–99)
NonHDL: 76.91
Total CHOL/HDL Ratio: 3
Triglycerides: 88 mg/dL (ref 0.0–149.0)
VLDL: 17.6 mg/dL (ref 0.0–40.0)

## 2022-10-26 LAB — COMPREHENSIVE METABOLIC PANEL
ALT: 17 U/L (ref 0–53)
AST: 18 U/L (ref 0–37)
Albumin: 3.2 g/dL — ABNORMAL LOW (ref 3.5–5.2)
Alkaline Phosphatase: 116 U/L (ref 39–117)
BUN: 25 mg/dL — ABNORMAL HIGH (ref 6–23)
CO2: 28 mEq/L (ref 19–32)
Calcium: 8.4 mg/dL (ref 8.4–10.5)
Chloride: 106 mEq/L (ref 96–112)
Creatinine, Ser: 1.13 mg/dL (ref 0.40–1.50)
GFR: 59.48 mL/min — ABNORMAL LOW (ref >60.00)
Glucose, Bld: 99 mg/dL (ref 70–99)
Potassium: 3.8 mEq/L (ref 3.5–5.1)
Sodium: 140 mEq/L (ref 135–145)
Total Bilirubin: 0.2 mg/dL (ref 0.2–1.2)
Total Protein: 5.3 g/dL — ABNORMAL LOW (ref 6.0–8.3)

## 2022-10-26 LAB — VITAMIN D 25 HYDROXY (VIT D DEFICIENCY, FRACTURES): VITD: 7.8 ng/mL — ABNORMAL LOW (ref 30.00–100.00)

## 2022-10-27 ENCOUNTER — Other Ambulatory Visit: Payer: Self-pay | Admitting: Radiology

## 2022-10-27 DIAGNOSIS — R809 Proteinuria, unspecified: Secondary | ICD-10-CM

## 2022-10-28 ENCOUNTER — Ambulatory Visit
Admission: RE | Admit: 2022-10-28 | Discharge: 2022-10-28 | Disposition: A | Discharge status: Home / Self Care | Payer: Medicare Other | Source: Ambulatory Visit | Attending: Nephrology | Admitting: Nephrology

## 2022-10-28 ENCOUNTER — Other Ambulatory Visit: Payer: Self-pay | Admitting: Nephrology

## 2022-10-28 ENCOUNTER — Other Ambulatory Visit: Payer: Self-pay

## 2022-10-28 DIAGNOSIS — R739 Hyperglycemia, unspecified: Secondary | ICD-10-CM | POA: Insufficient documentation

## 2022-10-28 DIAGNOSIS — G40909 Epilepsy, unspecified, not intractable, without status epilepticus: Secondary | ICD-10-CM | POA: Insufficient documentation

## 2022-10-28 DIAGNOSIS — R809 Proteinuria, unspecified: Secondary | ICD-10-CM | POA: Insufficient documentation

## 2022-10-28 DIAGNOSIS — I10 Essential (primary) hypertension: Secondary | ICD-10-CM | POA: Insufficient documentation

## 2022-10-28 DIAGNOSIS — N042 Nephrotic syndrome with diffuse membranous glomerulonephritis, unspecified: Secondary | ICD-10-CM | POA: Insufficient documentation

## 2022-10-28 LAB — CBC
HCT: 33.6 % — ABNORMAL LOW (ref 39.0–52.0)
Hemoglobin: 11.6 g/dL — ABNORMAL LOW (ref 13.0–17.0)
MCH: 29.5 pg (ref 26.0–34.0)
MCHC: 34.5 g/dL (ref 30.0–36.0)
MCV: 85.5 fL (ref 80.0–100.0)
Platelets: 285 10*3/uL (ref 150–400)
RBC: 3.93 MIL/uL — ABNORMAL LOW (ref 4.22–5.81)
RDW: 13.7 % (ref 11.5–15.5)
WBC: 8.9 10*3/uL (ref 4.0–10.5)
nRBC: 0 % (ref 0.0–0.2)

## 2022-10-28 LAB — PROTIME-INR
INR: 1 (ref 0.8–1.2)
Prothrombin Time: 13.4 seconds (ref 11.4–15.2)

## 2022-10-28 MED ORDER — LIDOCAINE HCL (PF) 1 % IJ SOLN
10.0000 mL | Freq: Once | INTRAMUSCULAR | Status: AC
  Administered 2022-10-28: 10 mL via INTRADERMAL
  Filled 2022-10-28: qty 10

## 2022-10-28 MED ORDER — MIDAZOLAM HCL 2 MG/2ML IJ SOLN
INTRAMUSCULAR | Status: AC | PRN
  Administered 2022-10-28 (×2): 1 mg via INTRAVENOUS

## 2022-10-28 MED ORDER — HYDRALAZINE HCL 20 MG/ML IJ SOLN
INTRAMUSCULAR | Status: AC
  Filled 2022-10-28: qty 1

## 2022-10-28 MED ORDER — FENTANYL CITRATE (PF) 100 MCG/2ML IJ SOLN
INTRAMUSCULAR | Status: AC | PRN
  Administered 2022-10-28: 50 ug via INTRAVENOUS
  Administered 2022-10-28: 25 ug via INTRAVENOUS

## 2022-10-28 MED ORDER — SODIUM CHLORIDE 0.9 % IV SOLN: INTRAVENOUS | Status: DC

## 2022-10-28 MED ORDER — FENTANYL CITRATE (PF) 100 MCG/2ML IJ SOLN
INTRAMUSCULAR | Status: AC
  Filled 2022-10-28: qty 4

## 2022-10-28 MED ORDER — MIDAZOLAM HCL 2 MG/2ML IJ SOLN
INTRAMUSCULAR | Status: AC
  Filled 2022-10-28: qty 4

## 2022-10-28 NOTE — H&P (Signed)
Chief Complaint: Patient was seen in consultation today for image-guided renal biopsy  Referring Physician(s): Korrapati,Madhu  Supervising Physician: Jacqulynn Cadet  Patient Status: [facility] - Out-pt  History of Present Illness: Mark Holmes is a 87 y.o. male with PMH significant for hypertension, seizure disorder, and hyperglycemia being seen today for image-guided renal biopsy due to proteinuria. The patient was recently found to have nephrotic proteinuria unexplained by patient's known history. Dr Lanora Manis from [place] has been following the patient and referred the patient to IR for an image-guided renal biopsy to determine etiology of his proteinuria.   Past Medical History:  Diagnosis Date   Benign prostatic hypertrophy with elevated PSA    per Dr. Jacqlyn Larsen   History of CT scan of brain 1992   normal   Hyperglycemia 01/2006   102   Hyperlipemia    Hypertension    Seizure disorder [facility])     Past Surgical History:  Procedure Laterality Date   ANTERIOR CERVICAL DECOMP/DISCECTOMY FUSION  05/2018   for myelopathy -C3/4 ([place] [facility])   CARPAL TUNNEL RELEASE Right 06/2019   ([place])   CATARACT EXTRACTION W/ INTRAOCULAR LENS IMPLANT Right 2012   [facility]   CATARACT EXTRACTION W/PHACO Left 01/14/2022   Procedure: CATARACT EXTRACTION PHACO AND INTRAOCULAR LENS PLACEMENT ([place]) LEFT;  Surgeon: Leandrew Koyanagi, MD;  Location: [place];  Service: Ophthalmology;  Laterality: Left;  7.55 01:03.6   History of EEG  1985   normal   hospitalization  1981   observation for seizuare   TONSILLECTOMY     TRANSURETHRAL RESECTION OF PROSTATE  10/2005   [name]   TRANSURETHRAL RESECTION OF PROSTATE  08/2017   (rpt) Jacqlyn Larsen    Allergies: Dilaudid [hydromorphone hcl]  Medications: Prior to Admission medications   Medication Sig Start Date End Date Taking? Authorizing Provider  Alpha-Lipoic Acid 600 MG TABS Take 1 tablet by  mouth daily. 05/19/22  Yes Ria Bush, MD  aspirin EC 81 MG tablet Take 1 tablet (81 mg total) by mouth daily. Swallow whole. 06/23/22  Yes Wyvonnia Dusky, MD  aspirin-acetaminophen-caffeine (EXCEDRIN MIGRAINE) [phone] MG per tablet Take 1 tablet by mouth 2 (two) times daily as needed.    Yes [provider]  atorvastatin (LIPITOR) 40 MG tablet Take 1 tablet (40 mg total) by mouth at bedtime. 06/22/22 10/28/22 Yes Wyvonnia Dusky, MD  b complex vitamins capsule Take 1 capsule by mouth daily. [facility] 3 in 1 05/01/22  Yes Ria Bush, MD  finasteride (PROSCAR) 5 MG tablet Take 1 tablet (5 mg total) by mouth daily. 02/02/22  Yes MacDiarmid, Nicki Reaper, MD  gabapentin (NEURONTIN) 300 MG capsule TAKE 1 CAPSULE BY MOUTH TWICE  DAILY 08/11/22  Yes Ria Bush, MD  phenytoin (DILANTIN) 100 MG ER capsule Take 2 capsules (200 mg total) by mouth 2 (two) times daily. 10/09/22  Yes Ria Bush, MD  ramipril (ALTACE) 10 MG capsule Take 1 capsule (10 mg total) by mouth daily. 11/17/21  Yes Ria Bush, MD  terazosin (HYTRIN) 10 MG capsule Take 1 capsule (10 mg total) by mouth daily. 11/17/21  Yes Ria Bush, MD  cyclobenzaprine (FLEXERIL) 5 MG tablet Take by mouth. Patient not taking: Reported on 07/30/2022    [provider]  desipramine (NORPRAMIN) 25 MG tablet Take 1 tablet (25 mg total) by mouth at bedtime. 05/19/22   Ria Bush, MD  tiZANidine (ZANAFLEX) 2 MG tablet Take by mouth. Patient not taking: Reported on  07/30/2022    [provider]     Family History  Problem Relation Age of Onset   Febrile seizures Sister    Cancer Sister        lung/smoker   CAD Neg Hx     Social History   Socioeconomic History   Marital status: Married    Spouse name: Sport and exercise psychologist   Number of children: 2   Years of education: Not on file   Highest education level: Master's degree (e.g., MA, MS, MEng, MEd, MSW, MBA)  Occupational  History   Occupation: Reitred 2001    Comment: Information systems manager  Tobacco Use   Smoking status: Never   Smokeless tobacco: Never  Vaping Use   Vaping Use: Never used  Substance and Sexual Activity   Alcohol use: No    Alcohol/week: 0.0 standard drinks of alcohol   Drug use: No   Sexual activity: Yes  Other Topics Concern   Not on file  Social History Narrative   Caffeine: 1 cup/day, 2 excedrin/day   Married since 1959   2 daughters   Occupation: retired, was Banker)   Activity: yardwork, stays active with grandson (swimming)   Diet: avoids carbs, good water, fruits/vegetables daily   Social Determinants of Health   Financial Resource Strain: Not on file  Food Insecurity: Not on file  Transportation Needs: Not on file  Physical Activity: Not on file  Stress: Not on file  Social Connections: Not on file    Review of Systems: A 12 point ROS discussed and pertinent positives are indicated in the HPI above.  All other systems are negative.  Review of Systems  Constitutional:  Negative for chills and fever.  Respiratory:  Negative for chest tightness and shortness of breath.   Cardiovascular:  Positive for leg swelling. Negative for chest pain.  Gastrointestinal:  Negative for abdominal pain, diarrhea, nausea and vomiting.  Neurological:  Negative for dizziness, light-headedness and headaches.  Psychiatric/Behavioral:  Negative for confusion.     Vital Signs: BP 133/81   Pulse 85   Temp 97.8 F (36.6 C) (Oral)   Resp 18   Ht '5\' 11"'$  (1.803 m)   Wt 173 lb (78.5 kg)   SpO2 96%   BMI 24.13 kg/m    Physical Exam Vitals reviewed.  Constitutional:      General: He is not in acute distress.    Appearance: He is normal weight.  HENT:     Mouth/Throat:     Mouth: Mucous membranes are moist.  Cardiovascular:     Rate and Rhythm: Normal rate and regular rhythm.     Pulses: Normal pulses.     Heart sounds: Normal heart sounds.  Pulmonary:      Effort: Pulmonary effort is normal.     Breath sounds: Normal breath sounds.  Abdominal:     General: Bowel sounds are normal.     Palpations: Abdomen is soft.  Musculoskeletal:     Right lower leg: Edema present.     Left lower leg: Edema present.     Comments: Patient with bilateral 2+ pitting edema  Skin:    General: Skin is warm and dry.  Neurological:     Mental Status: He is alert and oriented to person, place, and time.  Psychiatric:        Mood and Affect: Mood normal.        Behavior: Behavior normal.        Thought Content: Thought content  normal.        Judgment: Judgment normal.     Imaging: No results found.  Labs:  CBC: Recent Labs    06/20/22 1403 06/21/22 0525 06/22/22 0433 10/26/22 0806  WBC 6.6 7.1 8.6 9.3  HGB 10.8* 10.9* 11.2* 11.3*  HCT 32.7* 32.7* 33.4* 33.4*  PLT 257 250 257 271.0    COAGS: Recent Labs    06/20/22 1403  INR 1.1  APTT 29    BMP: Recent Labs    06/20/22 1403 06/21/22 0525 06/22/22 0433 10/26/22 0806  NA 137 139 142 140  K 3.9 3.9 4.0 3.8  CL 109 110 110 106  CO2 '26 26 28 28  '$ GLUCOSE 143* 87 89 99  BUN 25* 21 20 25*  CALCIUM 7.9* 8.0* 8.1* 8.4  CREATININE 1.09 0.99 1.13 1.13  GFRNONAA >60 >60 >60  --     LIVER FUNCTION TESTS: Recent Labs    11/17/21 1011 01/20/22 1405 05/19/22 1022 06/20/22 1403 06/21/22 0525 10/26/22 0806  BILITOT 0.4  --   --  0.5 0.4 0.2  AST 16  --   --  '21 21 18  '$ ALT 14  --   --  '17 17 17  '$ ALKPHOS 105  --   --  122 125 116  PROT 6.6  --  5.3* 5.2* 5.0* 5.3*  ALBUMIN 4.1   < > 3.2* 2.5* 2.4* 3.2*   < > = values in this interval not displayed.    TUMOR MARKERS: No results for input(s): "AFPTM", "CEA", "CA199", "CHROMGRNA" in the last 8760 hours.  Assessment and Plan:  Eduard Penkala is an extremely pleasant 87 yo male being seen today for an image-guided renal biopsy. The patient has recently begun experiencing nephrotic-range proteinuria with associated symptom of  lower extremity edema. The case is set to proceed with Dr Laurence Ferrari on 10/28/22.  Risks and benefits of image-guided renal biopsy was discussed with the patient and/or patient's family including, but not limited to bleeding, infection, damage to adjacent structures or low yield requiring additional tests.  All of the questions were answered and there is agreement to proceed.  Consent signed and in chart.   Thank you for this interesting consult.  I greatly enjoyed meeting ADAM SANJUAN and look forward to participating in their care.  A copy of this report was sent to the requesting provider on this date.  Electronically Signed: Lura Em, PA 10/28/2022, 8:44 AM   I spent a total of  30 Minutes   in face to face in clinical consultation, greater than 50% of which was counseling/coordinating care for image-guided renal biopsy.

## 2022-10-28 NOTE — Procedures (Signed)
Interventional Radiology Procedure Note  Procedure: CT guided random renal biopsy  Complications: None  Estimated Blood Loss: None  Recommendations: - Bedrest x 3 hrs - DC home   Signed,  Criselda Peaches, MD

## 2022-10-30 ENCOUNTER — Other Ambulatory Visit: Payer: Self-pay | Admitting: Family Medicine

## 2022-11-02 ENCOUNTER — Ambulatory Visit (INDEPENDENT_AMBULATORY_CARE_PROVIDER_SITE_OTHER): Payer: Medicare Other | Admitting: Family Medicine

## 2022-11-02 ENCOUNTER — Encounter: Payer: Self-pay | Admitting: Family Medicine

## 2022-11-02 VITALS — BP 134/76 | HR 80 | Temp 97.7°F | Ht 66.75 in | Wt 168.2 lb

## 2022-11-02 DIAGNOSIS — N400 Enlarged prostate without lower urinary tract symptoms: Secondary | ICD-10-CM

## 2022-11-02 DIAGNOSIS — Z8673 Personal history of transient ischemic attack (TIA), and cerebral infarction without residual deficits: Secondary | ICD-10-CM

## 2022-11-02 DIAGNOSIS — R011 Cardiac murmur, unspecified: Secondary | ICD-10-CM

## 2022-11-02 DIAGNOSIS — G40909 Epilepsy, unspecified, not intractable, without status epilepticus: Secondary | ICD-10-CM

## 2022-11-02 DIAGNOSIS — H9193 Unspecified hearing loss, bilateral: Secondary | ICD-10-CM

## 2022-11-02 DIAGNOSIS — E559 Vitamin D deficiency, unspecified: Secondary | ICD-10-CM

## 2022-11-02 DIAGNOSIS — I10 Essential (primary) hypertension: Secondary | ICD-10-CM

## 2022-11-02 DIAGNOSIS — R2681 Unsteadiness on feet: Secondary | ICD-10-CM

## 2022-11-02 DIAGNOSIS — I671 Cerebral aneurysm, nonruptured: Secondary | ICD-10-CM

## 2022-11-02 DIAGNOSIS — R809 Proteinuria, unspecified: Secondary | ICD-10-CM

## 2022-11-02 DIAGNOSIS — Z0000 Encounter for general adult medical examination without abnormal findings: Secondary | ICD-10-CM | POA: Diagnosis not present

## 2022-11-02 DIAGNOSIS — E782 Mixed hyperlipidemia: Secondary | ICD-10-CM

## 2022-11-02 DIAGNOSIS — G620 Drug-induced polyneuropathy: Secondary | ICD-10-CM

## 2022-11-02 MED ORDER — RAMIPRIL 10 MG PO CAPS: 10.0000 mg | ORAL_CAPSULE | Freq: Every day | ORAL | 3 refills | Status: DC

## 2022-11-02 MED ORDER — TERAZOSIN HCL 10 MG PO CAPS: 10.0000 mg | ORAL_CAPSULE | Freq: Every day | ORAL | 3 refills | Status: DC

## 2022-11-02 MED ORDER — ATORVASTATIN CALCIUM 40 MG PO TABS: 40.0000 mg | ORAL_TABLET | Freq: Every day | ORAL | 3 refills | Status: DC

## 2022-11-02 MED ORDER — VITAMIN D3 1.25 MG (50000 UT) PO TABS: 1.0000 | ORAL_TABLET | ORAL | 3 refills | Status: DC

## 2022-11-02 NOTE — Patient Instructions (Addendum)
Ok to try off desipramine. See if constipation improves off this medicine. If neuropathy, mood or sleep worsens, restart this.  Start vitamin D 50,000 units weekly sent to pharmacy.  Return in 3-4 months follow up visit.   Health Maintenance After Age 85 After age 15, you are at a higher risk for certain long-term diseases and infections as well as injuries from falls. Falls are a major cause of broken bones and head injuries in people who are older than age 63. Getting regular preventive care can help to keep you healthy and well. Preventive care includes getting regular testing and making lifestyle changes as recommended by your health care provider. Talk with your health care provider about: Which screenings and tests you should have. A screening is a test that checks for a disease when you have no symptoms. A diet and exercise plan that is right for you. What should I know about screenings and tests to prevent falls? Screening and testing are the best ways to find a health problem early. Early diagnosis and treatment give you the best chance of managing medical conditions that are common after age 25. Certain conditions and lifestyle choices may make you more likely to have a fall. Your health care provider may recommend: Regular vision checks. Poor vision and conditions such as cataracts can make you more likely to have a fall. If you wear glasses, make sure to get your prescription updated if your vision changes. Medicine review. Work with your health care provider to regularly review all of the medicines you are taking, including over-the-counter medicines. Ask your health care provider about any side effects that may make you more likely to have a fall. Tell your health care provider if any medicines that you take make you feel dizzy or sleepy. Strength and balance checks. Your health care provider may recommend certain tests to check your strength and balance while standing, walking, or changing  positions. Foot health exam. Foot pain and numbness, as well as not wearing proper footwear, can make you more likely to have a fall. Screenings, including: Osteoporosis screening. Osteoporosis is a condition that causes the bones to get weaker and break more easily. Blood pressure screening. Blood pressure changes and medicines to control blood pressure can make you feel dizzy. Depression screening. You may be more likely to have a fall if you have a fear of falling, feel depressed, or feel unable to do activities that you used to do. Alcohol use screening. Using too much alcohol can affect your balance and may make you more likely to have a fall. Follow these instructions at home: Lifestyle Do not drink alcohol if: Your health care provider tells you not to drink. If you drink alcohol: Limit how much you have to: 0-1 drink a day for women. 0-2 drinks a day for men. Know how much alcohol is in your drink. In the [place] one drink equals one 12 oz bottle of beer (355 mL), one 5 oz glass of wine (148 mL), or one 1 oz glass of hard liquor (44 mL). Do not use any products that contain nicotine or tobacco. These products include cigarettes, chewing tobacco, and vaping devices, such as e-cigarettes. If you need help quitting, ask your health care provider. Activity  Follow a regular exercise program to stay fit. This will help you maintain your balance. Ask your health care provider what types of exercise are appropriate for you. If you need a cane or walker, use it as recommended by your  health care provider. Wear supportive shoes that have nonskid soles. Safety  Remove any tripping hazards, such as rugs, cords, and clutter. Install safety equipment such as grab bars in bathrooms and safety rails on stairs. Keep rooms and walkways well-lit. General instructions Talk with your health care provider about your risks for falling. Tell your health care provider if: You fall. Be sure to tell your  health care provider about all falls, even ones that seem minor. You feel dizzy, tiredness (fatigue), or off-balance. Take over-the-counter and prescription medicines only as told by your health care provider. These include supplements. Eat a healthy diet and maintain a healthy weight. A healthy diet includes low-fat dairy products, low-fat (lean) meats, and fiber from whole grains, beans, and lots of fruits and vegetables. Stay current with your vaccines. Schedule regular health, dental, and eye exams. Summary Having a healthy lifestyle and getting preventive care can help to protect your health and wellness after age 46. Screening and testing are the best way to find a health problem early and help you avoid having a fall. Early diagnosis and treatment give you the best chance for managing medical conditions that are more common for people who are older than age 71. Falls are a major cause of broken bones and head injuries in people who are older than age 3. Take precautions to prevent a fall at home. Work with your health care provider to learn what changes you can make to improve your health and wellness and to prevent falls. This information is not intended to replace advice given to you by your health care provider. Make sure you discuss any questions you have with your health care provider. Document Revised: 04/28/2021 Document Reviewed: 04/28/2021 Elsevier Patient Education  [place].

## 2022-11-02 NOTE — Progress Notes (Unsigned)
Patient ID: Mark Holmes, male    DOB: [birth year], 87 y.o.   MRN: [id number]  This visit was conducted in person.  BP 134/76   Pulse 80   Temp 97.7 F (36.5 Mark) (Temporal)   Ht 5' 6.75" (1.695 m)   Wt 168 lb 3.2 oz (76.3 kg)   SpO2 95%   BMI 26.54 kg/m    CC: CPE/AMW Subjective:   HPI: Mark Holmes is a 87 y.o. male presenting on 11/02/2022 for Medicare Wellness (Pt asking if he needs to take desipramine. )   Did not see health advisor this year.   Hearing Screening - Comments:: Pt declines hearing screening today.  Had hearing screening in 07/2022 and told hearing aids are needed.  Vision Screening - Comments:: Last eye exam, 07/2022. [name] Office Visit from 11/02/2022 in [place] at [facility]  PHQ-2 Total Score 0          11/02/2022   10:26 AM 11/17/2021    9:20 AM 10/22/2020    9:06 AM 10/08/2020    1:17 PM 07/10/2019    9:26 AM  Fall Risk   Falls in the past year? 0 1 0 0 0  Number falls in past yr:  1     Injury with Fall?  0      Suffered acute/subacute stroke 06/2022 (punctate nonhemorrhagic stroke of posterior L lower midbrain presenting as sudden onset double vision and unsteadiness. MRI also showed remote lacunar infarcts of R thalamus and R cerebellum as well as 3m PCOM aneurysm, referred to neurosurgery for this with planned 6 month monitoring. Planned lifelong aspirin. Atorva increased to '40mg'$  daily.   Undergoing workup for nephrotic range proteinuria, s/p renal biopsy last week, results pending. Sees nephrology tomorrow. Worried about lupus - he had normal complement levels and anti-DS DNA levels checked last month 09/2022. Notes worsening stiffness with prolonged sitting, less o morning stiffness. No new rash.   Last completed outpatient PT 09/17/2022. Wonders about return - as he feels he benefits from this both physically and mentally.   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  S/p R CTS release surgery 06/21/2019 - with persistent hand paresthesias.    He's on nortriptyline '25mg'$  nightly for neuropathy. He's been using aspercream at night time as well.   Seizure - continues dilantin '100mg'$  2 tab BID, per neurology he is planning to switch to lamictal.    Preventative: Colon cancer screening - has not had colonoscopy before. Reassuring iFOBs previously. Decided to age out. No blood in stool or bowel changes.  Pro[facility] - [facility] ([facility]. H/o BPH with elevated PSA s/p TURP on avodart and terazosin. Sees Mark MMatilde Sprangyearly.  Lung cancer screening - not eligible  Flu shot - does not receive. [place], 01/2020, booster 08/2020  Pneumovax 2011, prevnar-13 2015. Tetanus shot Td 2011.   Shingrix - h/o shingles. Declines.  Advanced directives: has living will at home. HCPOA would be daughter. Would be ok with CPR and temporary trial of life support if reversible condition. Does not want prolonged life support. Does not want copy in our chart at this time - states daughter will be available.  Seat belt use discussed.  Sunscreen use discussed. No changing moles.  Non smoker Alcohol - none  Dentist - every few years (last saw  12/2021) Eye exam - yearly  Bowel - occ constipation managed with  Bladder - no incontinence  Caffeine: 1 cup/day, 2 excedrin/day   Married since 1959   2 daughters   Occupation: retired, was Banker)   Activity: walking dog, stays active with grandson (swimming)  Diet: avoids carbs, good water, fruits/vegetables daily      Relevant past medical, surgical, family and social history reviewed and updated as indicated. Interim medical history since our last visit reviewed. Allergies and medications reviewed and updated. Outpatient Medications Prior to Visit  Medication Sig Dispense Refill   Alpha-Lipoic  Acid 600 MG TABS Take 1 tablet by mouth daily.     aspirin EC 81 MG tablet Take 1 tablet (81 mg total) by mouth daily. Swallow whole. 30 tablet 12   aspirin-acetaminophen-caffeine (EXCEDRIN MIGRAINE) 852-778-24 MG per tablet Take 1 tablet by mouth 2 (two) times daily as needed.      b complex vitamins capsule Take 1 capsule by mouth daily. [facility] 3 in 1     finasteride (PROSCAR) 5 MG tablet Take 1 tablet (5 mg total) by mouth daily. 90 tablet 3   phenytoin (DILANTIN) 100 MG ER capsule Take 2 capsules (200 mg total) by mouth 2 (two) times daily. 360 capsule 0   atorvastatin (LIPITOR) 40 MG tablet Take 1 tablet (40 mg total) by mouth at bedtime. 30 tablet 0   desipramine (NORPRAMIN) 25 MG tablet Take 1 tablet (25 mg total) by mouth at bedtime. 30 tablet 6   ramipril (ALTACE) 10 MG capsule Take 1 capsule (10 mg total) by mouth daily. 90 capsule 3   terazosin (HYTRIN) 10 MG capsule Take 1 capsule (10 mg total) by mouth daily. 90 capsule 3   gabapentin (NEURONTIN) 300 MG capsule TAKE 1 CAPSULE BY MOUTH TWICE  DAILY (Patient not taking: Reported on 11/02/2022) 180 capsule 3   cyclobenzaprine (FLEXERIL) 5 MG tablet Take by mouth.     tiZANidine (ZANAFLEX) 2 MG tablet Take by mouth. (Patient not taking: Reported on 07/30/2022)     No facility-administered medications prior to visit.     Per HPI unless specifically indicated in ROS section below Review of Systems  Constitutional:  Positive for appetite change. Negative for activity change, chills, fatigue, fever and unexpected weight change.  HENT:  Negative for hearing loss.   Eyes:  Negative for visual disturbance.  Respiratory:  Positive for cough (dry). Negative for chest tightness, shortness of breath and wheezing.   Cardiovascular:  Positive for leg swelling. Negative for chest pain and palpitations.  Gastrointestinal:  Positive for constipation. Negative for abdominal distention, abdominal pain, blood in stool,  diarrhea, nausea and vomiting.  Genitourinary:  Negative for difficulty urinating and hematuria.  Musculoskeletal:  Negative for arthralgias, myalgias and neck pain.  Skin:  Negative for rash.  Neurological:  Negative for dizziness, seizures, syncope and headaches.  Hematological:  Negative for adenopathy. Does not bruise/bleed easily.  Psychiatric/Behavioral:  Negative for dysphoric mood. The patient is not nervous/anxious.     Objective:  BP 134/76   Pulse 80   Temp 97.7 F (36.5 Mark) (Temporal)   Ht 5' 6.75" (1.695 m)   Wt 168 lb 3.2 oz (76.3 kg)   SpO2 95%   BMI 26.54 kg/m   Wt Readings from Last 3 Encounters:  11/02/22 168 lb 3.2 oz (76.3 kg)  10/28/22 173 lb (78.5 kg)  07/30/22 172 lb 6.4 oz (78.2 kg)  Physical Exam Vitals and nursing note reviewed.  Constitutional:      General: He is not in acute distress.    Appearance: Normal appearance. He is well-developed. He is not ill-appearing.  HENT:     Head: Normocephalic and atraumatic.     Right Ear: Hearing, tympanic membrane, ear canal and external ear normal.     Left Ear: Hearing, tympanic membrane, ear canal and external ear normal.     Nose: Nose normal.     Mouth/Throat:     Mouth: Mucous membranes are moist.     Pharynx: Oropharynx is clear. No oropharyngeal exudate or posterior oropharyngeal erythema.  Eyes:     General: No scleral icterus.    Extraocular Movements: Extraocular movements intact.     Conjunctiva/sclera: Conjunctivae normal.     Pupils: Pupils are equal, round, and reactive to light.  Neck:     Thyroid: No thyroid mass or thyromegaly.  Cardiovascular:     Rate and Rhythm: Normal rate and regular rhythm.     Pulses: Normal pulses.          Radial pulses are 2+ on the right side and 2+ on the left side.     Heart sounds: Murmur (2/6 systolic USB) heard.  Pulmonary:     Effort: Pulmonary effort is normal. No respiratory distress.     Breath sounds: Normal breath sounds. No wheezing,  rhonchi or rales.  Abdominal:     General: Bowel sounds are normal. There is no distension.     Palpations: Abdomen is soft. There is no mass.     Tenderness: There is no abdominal tenderness. There is no guarding or rebound.     Hernia: No hernia is present.  Musculoskeletal:        General: Normal range of motion.     Cervical back: Normal range of motion and neck supple.     Right lower leg: Edema present.     Left lower leg: Edema present.     Comments: R aircast ankle brace  Lymphadenopathy:     Cervical: No cervical adenopathy.  Skin:    General: Skin is warm and dry.     Findings: No rash.  Neurological:     General: No focal deficit present.     Mental Status: He is alert and oriented to person, place, and time.     Comments:  Recall 3/3 Calculation 5/5 DLROW  Psychiatric:        Mood and Affect: Mood normal.        Behavior: Behavior normal.        Thought Content: Thought content normal.        Judgment: Judgment normal.       Results for orders placed or performed during the hospital encounter of 10/28/22  CBC upon arrival  Result Value Ref Range   WBC 8.9 4.0 - 10.5 K/uL   RBC 3.93 (L) 4.22 - 5.81 MIL/uL   Hemoglobin 11.6 (L) 13.0 - 17.0 g/dL   HCT 33.6 (L) 39.0 - 52.0 %   MCV 85.5 80.0 - 100.0 fL   MCH 29.5 26.0 - 34.0 pg   MCHC 34.5 30.0 - 36.0 g/dL   RDW 13.7 11.5 - 15.5 %   Platelets 285 150 - 400 K/uL   nRBC 0.0 0.0 - 0.2 %  Protime-INR upon arrival  Result Value Ref Range   Prothrombin Time 13.4 11.4 - 15.2 seconds   INR 1.0 0.8 - 1.2  Assessment & Plan:   Problem List Items Addressed This Visit   None    Meds ordered this encounter  Medications   terazosin (HYTRIN) 10 MG capsule    Sig: Take 1 capsule (10 mg total) by mouth daily.    Dispense:  90 capsule    Refill:  3   atorvastatin (LIPITOR) 40 MG tablet    Sig: Take 1 tablet (40 mg total) by mouth at bedtime.    Dispense:  90 tablet    Refill:  3   ramipril (ALTACE) 10 MG  capsule    Sig: Take 1 capsule (10 mg total) by mouth daily.    Dispense:  90 capsule    Refill:  3   Cholecalciferol (VITAMIN D3) 1.25 MG (50000 UT) TABS    Sig: Take 1 tablet by mouth once a week.    Dispense:  12 tablet    Refill:  3   No orders of the defined types were placed in this encounter.   Patient instructions: Ok to try off desipramine. See if constipation improves off this medicine. If neuropathy, mood or sleep worsens, restart this.  Start vitamin D 50,000 units weekly sent to pharmacy.  Return in 3-4 months follow up visit.   Follow up plan: Return in about 4 months (around 03/03/2023) for follow up visit.  Ria Bush, MD

## 2022-11-02 NOTE — Telephone Encounter (Signed)
Patient notified by telephone that new scripts were sent to [facility] 11/02/22 #90/3 refills on all that he requested. Patient had used an old prescription number requesting the refills. Patient will contact [facility] and let them know they should have refills on file for him. Patient was advised to call the office back if he has any problems and he verbalized understanding.

## 2022-11-03 ENCOUNTER — Telehealth: Payer: Self-pay | Admitting: Family Medicine

## 2022-11-03 NOTE — Assessment & Plan Note (Signed)
Markedly low - will start 50k units weekly - sent to pharmacy.

## 2022-11-03 NOTE — Assessment & Plan Note (Signed)
Neurosurgery is following this q8mo

## 2022-11-03 NOTE — Assessment & Plan Note (Signed)
Chronic, stable. Continue current regimen. 

## 2022-11-03 NOTE — Assessment & Plan Note (Signed)
Benefits from outpatient PT, just completed course 08/2022. Consider return to PT in 2024

## 2022-11-03 NOTE — Assessment & Plan Note (Signed)
Chronic issue, neurology stopped gabapentin. He asks about stopping desipramine - ok to trial off this.

## 2022-11-03 NOTE — Assessment & Plan Note (Signed)
Continues phenytoin, planning to transition to lamictal but this hasn't been started yet. Encouraged he discuss with neurology about starting transition.

## 2022-11-03 NOTE — Assessment & Plan Note (Signed)
Saw audiologist who recommended hearing aide. Encouraged he obtain one.

## 2022-11-03 NOTE — Assessment & Plan Note (Signed)
Chronic, on atorvastatin. The ASCVD Risk score (Arnett DK, et al., 2019) failed to calculate for the following reasons:   The 2019 ASCVD risk score is only valid for ages 47 to 27   The patient has a prior MI or stroke diagnosis

## 2022-11-03 NOTE — Telephone Encounter (Signed)
Spoke with Dr Lanora Manis - renal biopsy showed membranous nephropathy - planned further evaluation with PSA, CT pelvis/abd/chest.

## 2022-11-03 NOTE — Assessment & Plan Note (Signed)
S/p kidney biopsy last week, results pending.  He has nephrology f/u scheduled for later this week.

## 2022-11-03 NOTE — Assessment & Plan Note (Addendum)
S/p TURP. Continues terazosin (I prescribe) and finasteride (through uro)

## 2022-11-03 NOTE — Assessment & Plan Note (Signed)
Preventative protocols reviewed and updated unless pt declined. Discussed healthy diet and lifestyle.  

## 2022-11-03 NOTE — Assessment & Plan Note (Signed)

## 2022-11-03 NOTE — Assessment & Plan Note (Signed)
Continue aspirin and atorvastatin. ?

## 2022-11-03 NOTE — Assessment & Plan Note (Signed)
Mild, will continue to monitor. H/o aortic sclerosis without stenosis.

## 2022-11-06 ENCOUNTER — Encounter: Payer: Self-pay | Admitting: Nephrology

## 2022-11-06 LAB — SURGICAL PATHOLOGY

## 2022-11-10 ENCOUNTER — Other Ambulatory Visit: Payer: Self-pay | Admitting: Nephrology

## 2022-11-10 DIAGNOSIS — N049 Nephrotic syndrome with unspecified morphologic changes: Secondary | ICD-10-CM

## 2022-11-10 DIAGNOSIS — N281 Cyst of kidney, acquired: Secondary | ICD-10-CM

## 2022-11-10 DIAGNOSIS — R609 Edema, unspecified: Secondary | ICD-10-CM

## 2022-11-17 ENCOUNTER — Other Ambulatory Visit: Payer: Self-pay | Admitting: Neurosurgery

## 2022-11-17 DIAGNOSIS — I671 Cerebral aneurysm, nonruptured: Secondary | ICD-10-CM

## 2022-11-27 ENCOUNTER — Ambulatory Visit: Payer: Medicare Other

## 2022-11-27 ENCOUNTER — Ambulatory Visit
Admission: RE | Admit: 2022-11-27 | Discharge: 2022-11-27 | Disposition: A | Discharge status: Home / Self Care | Payer: Medicare Other | Source: Ambulatory Visit | Attending: Nephrology | Admitting: Nephrology

## 2022-11-27 DIAGNOSIS — N049 Nephrotic syndrome with unspecified morphologic changes: Secondary | ICD-10-CM

## 2022-11-27 DIAGNOSIS — R918 Other nonspecific abnormal finding of lung field: Secondary | ICD-10-CM | POA: Insufficient documentation

## 2022-11-27 DIAGNOSIS — D1803 Hemangioma of intra-abdominal structures: Secondary | ICD-10-CM | POA: Insufficient documentation

## 2022-11-27 DIAGNOSIS — N281 Cyst of kidney, acquired: Secondary | ICD-10-CM | POA: Insufficient documentation

## 2022-11-27 DIAGNOSIS — R609 Edema, unspecified: Secondary | ICD-10-CM | POA: Diagnosis present

## 2022-11-27 DIAGNOSIS — K5730 Diverticulosis of large intestine without perforation or abscess without bleeding: Secondary | ICD-10-CM | POA: Diagnosis not present

## 2022-11-27 DIAGNOSIS — K7689 Other specified diseases of liver: Secondary | ICD-10-CM | POA: Insufficient documentation

## 2022-11-27 DIAGNOSIS — I2510 Atherosclerotic heart disease of native coronary artery without angina pectoris: Secondary | ICD-10-CM | POA: Insufficient documentation

## 2022-11-27 DIAGNOSIS — I700 Atherosclerosis of aorta: Secondary | ICD-10-CM | POA: Diagnosis not present

## 2022-11-30 ENCOUNTER — Encounter: Payer: Self-pay | Admitting: Family Medicine

## 2022-11-30 DIAGNOSIS — N052 Unspecified nephritic syndrome with diffuse membranous glomerulonephritis: Secondary | ICD-10-CM | POA: Insufficient documentation

## 2022-12-03 ENCOUNTER — Other Ambulatory Visit: Payer: Self-pay | Admitting: Neurosurgery

## 2022-12-03 DIAGNOSIS — I671 Cerebral aneurysm, nonruptured: Secondary | ICD-10-CM

## 2022-12-12 ENCOUNTER — Ambulatory Visit
Admission: RE | Admit: 2022-12-12 | Discharge: 2022-12-12 | Disposition: A | Discharge status: Home / Self Care | Payer: Medicare Other | Source: Ambulatory Visit | Attending: Neurosurgery | Admitting: Neurosurgery

## 2022-12-12 DIAGNOSIS — I671 Cerebral aneurysm, nonruptured: Secondary | ICD-10-CM | POA: Insufficient documentation

## 2022-12-23 ENCOUNTER — Other Ambulatory Visit: Payer: Self-pay | Admitting: Family Medicine

## 2022-12-25 NOTE — Telephone Encounter (Signed)
Dilantin Last filled: 10/28/22, #360 Last OV: 11/02/22, AWV Next OV: 03/03/23, 4 mo f/u

## 2022-12-27 NOTE — Telephone Encounter (Signed)
Refilled 1 3 month supply. Will reassess at app 02/2022 as will need rpt labs, ultimate goal is to transition to different AED.

## 2023-02-08 ENCOUNTER — Ambulatory Visit (INDEPENDENT_AMBULATORY_CARE_PROVIDER_SITE_OTHER): Payer: Medicare Other | Admitting: Urology

## 2023-02-08 VITALS — BP 168/84 | HR 64 | Ht 71.0 in | Wt 174.0 lb

## 2023-02-08 DIAGNOSIS — R350 Frequency of micturition: Secondary | ICD-10-CM | POA: Diagnosis not present

## 2023-02-08 LAB — URINALYSIS, COMPLETE
Bilirubin, UA: NEGATIVE
Glucose, UA: NEGATIVE
Leukocytes,UA: NEGATIVE
Nitrite, UA: NEGATIVE
Specific Gravity, UA: 1.025 (ref 1.005–1.030)
Urobilinogen, Ur: 0.2 mg/dL (ref 0.2–1.0)
pH, UA: 5.5 (ref 5.0–7.5)

## 2023-02-08 LAB — MICROSCOPIC EXAMINATION

## 2023-02-08 MED ORDER — FINASTERIDE 5 MG PO TABS: 5.0000 mg | ORAL_TABLET | Freq: Every day | ORAL | 3 refills | Status: DC

## 2023-02-08 NOTE — Progress Notes (Signed)
02/08/2023 8:22 AM   Mark Holmes [birth year] IN:[id number]  Referring provider: Ria Bush, MD [address] [place],  [place] [address]  Chief Complaint  Patient presents with   Follow-up    HPI: Patient saw Dr. Chauncey Cruel 1 year ago for voiding dysfunction and had a TURP in 2018.  A prior biopsy for elevated PSA was normal.  In July 2019 PSA was 0.8.  BSA was 4.552 years ago and in July 2016 was 0.51   His main complaint is slow flow and he presses down with his abdominal muscles to strain.  He gets up 3 times a night.  He has no ankle edema.  He voids infrequently during the day.  He is still on Avodart.   Clinically not infected   50 g benign prostate with hemorrhoid and mild anal stenosis    Patient will continue to get his PSA by primary care though we talked about its usefulness in his age group.  I will represcribe the Avodart.  There is really nothing else that we can do for his poor flow and unlocked in order urodynamics.  He said 2 prostate procedures over the years.  We talked about the use of an overactive bladder medication for nighttime frequency but it could theoretically worsen his flow   Avodart 90 tablets and 3 refills sent and see in 1 year.  Unfortunately the patient lost his dog Johnny in November   Stable with good days and bad days no infection or blood 40 g benign prostate with some mild anal stenosis and see in 1 year on finasteride   Today Flow stable but better after a bowel movement.  No infection.  Frequency stable.  No blood in urine 40 to 50 g benign prostate    Today Frequency stable.  Flow varies and better after bowel movement.  Had a recent renal biopsy for protein in the urine.  Today he had bacteria white blood cells and red blood cells in the urine and I sent it for culture.  His last 3 urinalysis have shown no blood.  I reviewed a recent nonenhanced CT scan November 27, 2022 and he had a small stone in each kidney and he may have  had a nonobstructing less than 2 mm stone near the left ureterovesical junction.  He is not having any flank pain.  He has a hemorrhoid.  40-50 g benign prostate.   PMH: Past Medical History:  Diagnosis Date   Benign prostatic hypertrophy with elevated PSA    per Dr. Jacqlyn Larsen   History of CT scan of brain 1992   normal   Hyperglycemia 01/2006   102   Hyperlipemia    Hypertension    Seizure disorder [facility])     Surgical History: Past Surgical History:  Procedure Laterality Date   ANTERIOR CERVICAL DECOMP/DISCECTOMY FUSION  05/2018   for myelopathy -C3/4 ([facility])   CARPAL TUNNEL RELEASE Right 06/2019   ([facility])   CATARACT EXTRACTION W/ INTRAOCULAR LENS IMPLANT Right 2012   [facility]   CATARACT EXTRACTION W/PHACO Left 01/14/2022   Procedure: CATARACT EXTRACTION PHACO AND INTRAOCULAR LENS PLACEMENT ([facility]) LEFT;  Surgeon: Leandrew Koyanagi, MD;  Location: [facility];  Service: Ophthalmology;  Laterality: Left;  7.55 01:03.6   History of EEG  1985   normal   hospitalization  1981   observation for seizuare   TONSILLECTOMY     TRANSURETHRAL RESECTION OF PROSTATE  10/2005   [name]  TRANSURETHRAL RESECTION OF PROSTATE  08/2017   (rpt) [facility]    Home Medications:  Allergies as of 02/08/2023       Reactions   Dilaudid [hydromorphone Hcl] Other (See Comments)   hallucinations        Medication List        Accurate as of February 08, 2023  8:22 AM. If you have any questions, ask your nurse or doctor.          Alpha-Lipoic Acid 600 MG Tabs Take 1 tablet by mouth daily.   aspirin EC 81 MG tablet Take 1 tablet (81 mg total) by mouth daily. Swallow whole.   aspirin-acetaminophen-caffeine 250-250-65 MG tablet Commonly known as: EXCEDRIN MIGRAINE Take 1 tablet by mouth 2 (two) times daily as needed.   atorvastatin 40 MG tablet Commonly known as: LIPITOR Take 1 tablet (40 mg total) by mouth at bedtime.   b complex vitamins capsule Take 1  capsule by mouth daily. [facility] 3 in 1   finasteride 5 MG tablet Commonly known as: PROSCAR Take 1 tablet (5 mg total) by mouth daily.   phenytoin 100 MG ER capsule Commonly known as: DILANTIN TAKE 2 CAPSULES BY MOUTH TWICE  DAILY   ramipril 10 MG capsule Commonly known as: ALTACE Take 1 capsule (10 mg total) by mouth daily.   terazosin 10 MG capsule Commonly known as: HYTRIN Take by mouth.   terazosin 10 MG capsule Commonly known as: HYTRIN Take 1 capsule (10 mg total) by mouth daily.   Vitamin D (Ergocalciferol) 1.25 MG (50000 UNIT) Caps capsule Commonly known as: DRISDOL Take 50,000 Units by mouth once a week.   Vitamin D3 1.25 MG (50000 UT) Tabs Generic drug: Cholecalciferol Take 1 tablet by mouth once a week.        Allergies:  Allergies  Allergen Reactions   Dilaudid [Hydromorphone Hcl] Other (See Comments)    hallucinations    Family History: Family History  Problem Relation Age of Onset   Febrile seizures Sister    Cancer Sister        lung/smoker   CAD Neg Hx     Social History:  reports that he has never smoked. He has never used smokeless tobacco. He reports that he does not drink alcohol and does not use drugs.  ROS:                                        Physical Exam: There were no vitals taken for this visit.  Constitutional:  Alert and oriented, No acute distress. HEENT: [place] AT, moist mucus membranes.  Trachea midline, no masses.   Laboratory Data: Lab Results  Component Value Date   WBC 8.9 10/28/2022   HGB 11.6 (L) 10/28/2022   HCT 33.6 (L) 10/28/2022   MCV 85.5 10/28/2022   PLT 285 10/28/2022    Lab Results  Component Value Date   CREATININE 1.13 10/26/2022    Lab Results  Component Value Date   PSA 0.62 07/04/2020   PSA 0.51 07/06/2019   PSA 0.81 07/01/2018    No results found for: "TESTOSTERONE"  Lab Results  Component Value Date   HGBA1C 5.4 06/21/2022     Urinalysis    Component Value Date/Time   COLORURINE YELLOW 06/03/2022 0818   APPEARANCEUR Sl Cloudy (A) 06/03/2022 0818   APPEARANCEUR Clear 01/17/2019 1320   LABSPEC 1.020  06/03/2022 0818   PHURINE 5.5 06/03/2022 0818   GLUCOSEU NEGATIVE 06/03/2022 0818   HGBUR TRACE-INTACT (A) 06/03/2022 0818   BILIRUBINUR NEGATIVE 06/03/2022 0818   BILIRUBINUR Negative 01/17/2019 1320   KETONESUR TRACE (A) 06/03/2022 0818   PROTEINUR Negative 01/17/2019 1320   UROBILINOGEN 0.2 06/03/2022 0818   NITRITE NEGATIVE 06/03/2022 0818   LEUKOCYTESUR NEGATIVE 06/03/2022 0818    Pertinent Imaging:   Assessment & Plan: Finasteride 90 x 3 sent to pharmacy and I will see in 1 year.  He is trying to sort out his lower leg neuropathy likely from Dilantin we had a lengthy discussion regarding it  1. Urinary frequency  - Urinalysis, Complete   No follow-ups on file.  Reece Packer, MD  [facility] [address] [place], [place] [address] [phone]

## 2023-02-11 LAB — CULTURE, URINE COMPREHENSIVE

## 2023-02-17 ENCOUNTER — Encounter: Payer: Self-pay | Admitting: Family Medicine

## 2023-02-17 DIAGNOSIS — R809 Proteinuria, unspecified: Secondary | ICD-10-CM

## 2023-02-17 DIAGNOSIS — G40909 Epilepsy, unspecified, not intractable, without status epilepticus: Secondary | ICD-10-CM

## 2023-02-17 DIAGNOSIS — N052 Unspecified nephritic syndrome with diffuse membranous glomerulonephritis: Secondary | ICD-10-CM

## 2023-02-23 ENCOUNTER — Other Ambulatory Visit: Payer: Self-pay | Admitting: Family Medicine

## 2023-02-24 NOTE — Telephone Encounter (Signed)
ERx - upcoming appt

## 2023-02-24 NOTE — Telephone Encounter (Signed)
Dilantin Last filled:  12/30/22, #360 Last OV: 11/02/22, AWV Next OV:  03/03/23, 4 mo f/u

## 2023-03-03 ENCOUNTER — Encounter: Payer: Self-pay | Admitting: Family Medicine

## 2023-03-03 ENCOUNTER — Ambulatory Visit: Payer: Medicare Other | Admitting: Family Medicine

## 2023-03-03 VITALS — BP 132/64 | HR 76 | Temp 97.5°F | Ht 71.0 in | Wt 170.2 lb

## 2023-03-03 DIAGNOSIS — G40909 Epilepsy, unspecified, not intractable, without status epilepticus: Secondary | ICD-10-CM

## 2023-03-03 DIAGNOSIS — M48062 Spinal stenosis, lumbar region with neurogenic claudication: Secondary | ICD-10-CM

## 2023-03-03 DIAGNOSIS — G620 Drug-induced polyneuropathy: Secondary | ICD-10-CM

## 2023-03-03 DIAGNOSIS — R2681 Unsteadiness on feet: Secondary | ICD-10-CM

## 2023-03-03 DIAGNOSIS — M4712 Other spondylosis with myelopathy, cervical region: Secondary | ICD-10-CM

## 2023-03-03 DIAGNOSIS — N052 Unspecified nephritic syndrome with diffuse membranous glomerulonephritis: Secondary | ICD-10-CM | POA: Diagnosis not present

## 2023-03-03 DIAGNOSIS — M4722 Other spondylosis with radiculopathy, cervical region: Secondary | ICD-10-CM

## 2023-03-03 DIAGNOSIS — E559 Vitamin D deficiency, unspecified: Secondary | ICD-10-CM | POA: Diagnosis not present

## 2023-03-03 DIAGNOSIS — R809 Proteinuria, unspecified: Secondary | ICD-10-CM

## 2023-03-03 LAB — VITAMIN D 25 HYDROXY (VIT D DEFICIENCY, FRACTURES): VITD: 34.59 ng/mL (ref 30.00–100.00)

## 2023-03-03 MED ORDER — FOLIC ACID 400 MCG PO TABS: 0.4000 mg | ORAL_TABLET | Freq: Every day | ORAL | Status: AC

## 2023-03-03 NOTE — Progress Notes (Addendum)
Patient ID: Mark Holmes, male    DOB: 1935-09-12, 88 y.o.   MRN: [id number]  This visit was conducted in person.  BP 132/64   Pulse 76   Temp (!) 97.5 F (36.4 C) (Temporal)   Ht 5\' 11"  (1.803 m)   Wt 170 lb 4 oz (77.2 kg)   SpO2 97%   BMI 23.75 kg/m    CC: 4 mo f/u visit  Subjective:   HPI: Mark Holmes is a 88 y.o. male presenting on 03/03/2023 for Medical Management of Chronic Issues (Here for 4 mo f/u.)   Notes ongoing concern with leg numbness and weakness from lower back down, ongoing neuropathy and paresthesias to bilateral feet up to knees. Dull ache to lower back, abnormal wide stepped gait. Legs start burning at night time - manages with aspercream. Also noticing weak urinary stream, straining to urinate, loss of sensation with urination (despite terazosin and finasteride), and constipation. No benefit with bilateral epidural steroid injections.   Last outpatient PT session 08/2022 with benefit - requests new referral.   Nephrotic range proteinuria s/p renal biopsy 10/2022 showing membranous nephropathy, seeing nephrology Dr Lanora Manis regularly.   Seizure disorder (2 seizures 1980s) longterm on phenytoin since, concern phenytoin contributing to multiple symptoms including nephropathy and neuropathy, have been trying to come off this medication since 2022.   Recent rheumatology eval for large fiber neuropathy by [name] PA at [facility] - not thought autoimmune related. Normal anti-SSA/SSB as well as normal anti-dsDNA, neg ANA, normal ESR, CRP.   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  S/p R CTS release surgery 06/21/2019 - with persistent hand paresthesias.      Relevant past medical, surgical, family and social history reviewed and updated as indicated. Interim medical history since our last visit reviewed. Allergies and medications reviewed and updated. Outpatient  Medications Prior to Visit  Medication Sig Dispense Refill   Alpha-Lipoic Acid 600 MG TABS Take 1 tablet by mouth daily.     aspirin EC 81 MG tablet Take 1 tablet (81 mg total) by mouth daily. Swallow whole. 30 tablet 12   aspirin-acetaminophen-caffeine (EXCEDRIN MIGRAINE) [id number] MG per tablet Take 1 tablet by mouth 2 (two) times daily as needed.      atorvastatin (LIPITOR) 40 MG tablet Take 1 tablet (40 mg total) by mouth at bedtime. 90 tablet 3   b complex vitamins capsule Take 1 capsule by mouth daily. [facility] 3 in 1     Cholecalciferol (VITAMIN D3) 1.25 MG (50000 UT) TABS Take 1 tablet by mouth once a week. 12 tablet 3   finasteride (PROSCAR) 5 MG tablet Take 1 tablet (5 mg total) by mouth daily. 90 tablet 3   phenytoin (DILANTIN) 100 MG ER capsule TAKE 2 CAPSULES BY MOUTH TWICE  DAILY 360 capsule 0   ramipril (ALTACE) 10 MG capsule Take 1 capsule (10 mg total) by mouth daily. (Patient taking differently: Take 10 mg by mouth 2 (two) times daily.) 90 capsule 3   terazosin (HYTRIN) 10 MG capsule Take 1 capsule (10 mg total) by mouth daily. 90 capsule 3   FOLIC ACID PO Take by mouth daily.     terazosin (HYTRIN) 10 MG capsule Take by mouth.     Vitamin D, Ergocalciferol, (DRISDOL) 1.25 MG (50000 UNIT) CAPS capsule Take 50,000 Units by mouth once a week.     No facility-administered medications prior  to visit.     Per HPI unless specifically indicated in ROS section below Review of Systems  Objective:  BP 132/64   Pulse 76   Temp (!) 97.5 F (36.4 C) (Temporal)   Ht 5\' 11"  (1.803 m)   Wt 170 lb 4 oz (77.2 kg)   SpO2 97%   BMI 23.75 kg/m   Wt Readings from Last 3 Encounters:  03/03/23 170 lb 4 oz (77.2 kg)  02/08/23 174 lb (78.9 kg)  11/02/22 168 lb 3.2 oz (76.3 kg)      Physical Exam Vitals and nursing note reviewed.  Constitutional:      Appearance: Normal appearance. He is not ill-appearing.     Comments: Ambulates with cane  Musculoskeletal:      Right lower leg: Edema (1+, chronic) present.     Left lower leg: No edema.     Comments:  Wearing brace to R ankle, chronically No pain midline spine ++ L mid lumbar paraspinous mm tenderness and tightness Neg seated SLR bilaterally. No pain with int/ext rotation at hip.  Skin:    General: Skin is warm and dry.     Findings: No rash.  Neurological:     Mental Status: He is alert.     Comments:  5/5 strength BLE, except for mildly diminished strength to bilateral hip flexors Diminished sensation to light touch R>L  Psychiatric:        Mood and Affect: Mood normal.        Behavior: Behavior normal.    Results for orders placed or performed in visit on 03/03/23  VITAMIN D 25 Hydroxy (Vit-D Deficiency, Fractures)  Result Value Ref Range   VITD 34.59 30.00 - 100.00 ng/mL       Lab Results  Component Value Date   PHENYTOIN 6.2 (L) 06/21/2022   Assessment & Plan:  ADDENDUM ==> in interim, pt was contacted by [facility] clinic and scheduled an appointment to discuss AED transition - he desires to keep this appt and requests cancellation of referral to [place] at this time.   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  Last saw neurosurgery Dr Marcello Moores 12/2019.  Rpt cervical MRI 11/2020 - overall unchanged size of L C2 neurofibroma (11-55mm).       Relevant Orders   Ambulatory referral to Physical Therapy   Seizure disorder ([place])    See below - remote h/o seizure (1980s) on phenytoin since, without known recurrent seizure.  Recommend transition to different AED given neuropathy and nephropathy.  Will refer to new neurologist for assistance with this.       Relevant Orders   Phenytoin level, free and total   Vitamin D deficiency    Update vit D levels on 50k replacement.       Relevant Orders   VITAMIN D 25  Hydroxy (Vit-D Deficiency, Fractures) (Completed)   Peripheral neuropathy - Primary    Chronic, severe.  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  Reassuring rheum eval.  Most recently gabapentin stopped by [facility] neurology due to being ineffective.  Recommendation has been to transition off phenytoin onto different AED - but I need assistance from neurology to do this - has not yet happened. Pt not satisfied with [facility] neurology care and requests referral to different neurologist. As he previously saw [facility], will place referral back to [place].  Unsteadiness on feet    Given concern for worsening weakness, will refer back to outpatient PT per pt request       Relevant Orders   Ambulatory referral to Physical Therapy   Nephrotic range proteinuria   Membranous glomerulonephritis    By renal biopsy 2023 ([facility]). Pan CT scan negative for malignancy 11/2022.  Concern med related (pheyntoin) - see above.       Spinal stenosis of lumbar region with neurogenic claudication    Chronic issue, neural compression could occur at multiple levels.       Relevant Orders   Ambulatory referral to Physical Therapy     Meds ordered this encounter  Medications   folic acid (FOLVITE) [id number] MCG tablet    Sig: Take 1 tablet (0.4 mg total) by mouth daily.    Orders Placed This Encounter  Procedures   Phenytoin level, free and total   VITAMIN D 25 Hydroxy (Vit-D Deficiency, Fractures)   Ambulatory referral to Physical Therapy    Referral Priority:   Routine    Referral Type:   Physical Medicine    Referral Reason:   Specialty Services Required    Requested Specialty:   Physical Therapy    Number of Visits Requested:   1    Patient Instructions  Labs today  We will work on setting you up with [facility] to help take you off Dilantin.  We will refer you back to physical therapy [facility] at [facility]).  Return to see me in 3-4 months.   Follow up plan: Return in about 3 months (around  06/03/2023), or if symptoms worsen or fail to improve, for follow up visit.  Ria Bush, MD

## 2023-03-03 NOTE — Patient Instructions (Addendum)
Labs today  We will work on setting you up with [facility] to help take you off Dilantin.  We will refer you back to physical therapy [facility] at [facility]).  Return to see me in 3-4 months.

## 2023-03-04 ENCOUNTER — Encounter: Payer: Self-pay | Admitting: Family Medicine

## 2023-03-04 DIAGNOSIS — M48062 Spinal stenosis, lumbar region with neurogenic claudication: Secondary | ICD-10-CM | POA: Insufficient documentation

## 2023-03-04 LAB — PHENYTOIN LEVEL, FREE AND TOTAL: PHENYTOIN, FREE: 0.9 mg/L — ABNORMAL LOW (ref 1.0–2.0)

## 2023-03-04 NOTE — Assessment & Plan Note (Signed)
By renal biopsy 2023 ([facility]). Pan CT scan negative for malignancy 11/2022.  Concern med related (pheyntoin) - see above.

## 2023-03-04 NOTE — Assessment & Plan Note (Signed)
Given concern for worsening weakness, will refer back to outpatient PT per pt request

## 2023-03-04 NOTE — Assessment & Plan Note (Signed)
See below - remote h/o seizure (1980s) on phenytoin since, without known recurrent seizure.  Recommend transition to different AED given neuropathy and nephropathy.  Will refer to new neurologist for assistance with this.

## 2023-03-04 NOTE — Assessment & Plan Note (Addendum)
Chronic, severe.  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  Reassuring rheum eval.  Most recently gabapentin stopped by [facility] neurology due to being ineffective.  Recommendation has been to transition off phenytoin onto different AED - but I need assistance from neurology to do this - has not yet happened. Pt not satisfied with [facility] neurology care and requests referral to different neurologist. As he previously saw [facility], will place referral back to [place].

## 2023-03-04 NOTE — Assessment & Plan Note (Addendum)
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  Last saw neurosurgery Dr Marcello Moores 12/2019.  Rpt cervical MRI 11/2020 - overall unchanged size of L C2 neurofibroma (11-64m).

## 2023-03-04 NOTE — Assessment & Plan Note (Signed)
Update vit D levels on 50k replacement.

## 2023-03-04 NOTE — Assessment & Plan Note (Signed)
Chronic issue, neural compression could occur at multiple levels.

## 2023-03-05 NOTE — Addendum Note (Signed)
Addended by: Ria Bush on: 03/05/2023 07:56 AM   Modules accepted: Orders

## 2023-03-17 NOTE — Therapy (Unsigned)
OUTPATIENT PHYSICAL THERAPY THORACOLUMBAR EVALUATION   Patient Name: Mark Holmes MRN: [id number] DOB:10/19/1935, 88 y.o., male Today's Date: 03/17/2023  END OF SESSION:   Past Medical History:  Diagnosis Date   Benign prostatic hypertrophy with elevated PSA    per Dr. Jacqlyn Larsen   History of CT scan of brain 1992   normal   Hyperglycemia 01/2006   102   Hyperlipemia    Hypertension    Seizure disorder [facility])    Past Surgical History:  Procedure Laterality Date   ANTERIOR CERVICAL DECOMP/DISCECTOMY FUSION  05/2018   for myelopathy -C3/4 ([place] [facility])   CARPAL TUNNEL RELEASE Right 06/2019   ([facility])   CATARACT EXTRACTION W/ INTRAOCULAR LENS IMPLANT Right 2012    eye   CATARACT EXTRACTION W/PHACO Left 01/14/2022   Procedure: CATARACT EXTRACTION PHACO AND INTRAOCULAR LENS PLACEMENT ([place]) LEFT;  Surgeon: Leandrew Koyanagi, MD;  Location: [place];  Service: Ophthalmology;  Laterality: Left;  7.55 01:03.6   History of EEG  1985   normal   hospitalization  1981   observation for seizuare   TONSILLECTOMY     TRANSURETHRAL RESECTION OF PROSTATE  10/2005   [place] RESECTION OF PROSTATE  08/2017   (rpt) [place]   Patient Active Problem List   Diagnosis Date Noted   Spinal stenosis of lumbar region with neurogenic claudication 03/04/2023   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   Bilateral hearing loss 06/30/2022   Brain aneurysm 06/30/2022   History of ischemic stroke 06/20/2022   Nephrotic range proteinuria 06/04/2022   Hypoproteinemia ([place]) 06/01/2022   Atypical chest pain 07/10/2020   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   Unsteadiness on feet 11/08/2019   History of spinal surgery 01/17/2019   Neck pain 01/17/2019   Weakness of hand 01/17/2019   BPH (benign prostatic hyperplasia) [id number]   Systolic murmur [id number]   Right carpal tunnel syndrome 02/02/2018   Ulnar neuropathy at elbow, left 02/02/2018   Peripheral  neuropathy 01/26/2018   Bilateral buttock pain 06/29/2016   Failed vision screen 06/29/2016   Advanced care planning/counseling discussion 06/21/2015   Medicare annual wellness visit, subsequent 06/18/2014   Health maintenance examination 06/18/2014   Chronic ankle pain 06/18/2014   Incomplete emptying of bladder 06/07/2013   Vitamin D deficiency 05/20/2013   THYROID NODULE 05/17/2007   Mixed hyperlipidemia 05/17/2007   Essential tremor 05/17/2007   Essential hypertension 05/17/2007   Cervical spondylosis with myelopathy and radiculopathy 05/17/2007   Seizure disorder ([place]) 05/17/2007    PCP: Ria Bush, MD  REFERRING PROVIDER: Ria Bush, MD; also recommended by Jennings Books, MD (Neurology)  REFERRING DIAG: unsteadiness on feet, cervical spondylosis with myelopathy and radiculopathy, spinal stenosis of lumbar region with neurogenic claudication  Rationale for Evaluation and Treatment: Rehabilitation  THERAPY DIAG:  No diagnosis found.  ONSET DATE: chronic   SUBJECTIVE:  SUBJECTIVE STATEMENT: Patient is known to this clinic and physical therapist. He is returning to PT after several months have passed since his last episode of care which concluded in 08/2022.   PERTINENT HISTORY:  Patient is a 88 y.o. male who presents to outpatient physical therapy with a referral for medical diagnosis ***. This patient's chief complaints consist of ***, leading to the following functional deficits: ***. Relevant past medical history and comorbidities include CVA on 06/20/2022 (MRI showed punctate acute/subacute nonhemorrhagic infarct in the posterior left lower midbrain near the 4th cranial nerve nucleus with remote lacunar infarcts of the right thalamus and right cerebellum),  66mm PCOM aneurysm (found  incidentally on CTA 06/20/2022, currently undergoing conservative monitoring), concern over dilantin related neuropathy (starting to wean off), systolic murmur, benign prostatic hyperplasia with urinary obstruction (followed by urology), chronic R ankle pain (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  Patient denies hx of {redflags:27294}    PAIN:  Are you having pain? Yes: NPRS scale: Current: ***/10,  Best: ***/10, Worst: ***/10. Pain location: *** Pain description: *** Aggravating factors: *** Relieving factors: ***   FUNCTIONAL LIMITATIONS: ***  LEISURE: gardening, taking the dog out   PRECAUTIONS: {Therapy precautions:24002}  WEIGHT BEARING RESTRICTIONS: {Yes ***/No:24003}  FALLS:  Has patient fallen in last 6 months? {fallsyesno:27318}  LIVING ENVIRONMENT: Lives with: {OPRC lives with:25569::"lives with their family"} Lives in: {Lives in:25570} Stairs: {opstairs:27293} Has following equipment at home: {Assistive devices:23999}  OCCUPATION: ***  PLOF: Independent  PATIENT GOALS: ***   OBJECTIVE  DIAGNOSTIC FINDINGS:  Has had MR angio Head, MR Brain, Cervical spine MRI, and Lumbar spine MRI that may be relevant (see [facility] chart for detailed reports).   SELF- REPORTED FUNCTION FOTO score: ***/100 (balance questionnaire)  OBSERVATION/INSPECTION Posture Posture (seated): forward head, rounded shoulders, slumped in sitting.  Posture (standing): *** Posture correction: *** Anthropometrics Tremor: none Body composition:  *** Muscle bulk: *** Skin: The incision sites appear to be healing well with no excessive redness, warmth, drainage or signs of infection present.  *** Edema: *** Functional Mobility Bed mobility: *** Transfers: *** Gait: grossly WFL for household and short community ambulation. More detailed gait analysis deferred to later date as needed. *** Stairs: ***  SPINE MOTION  LUMBAR SPINE AROM *Indicates pain Flexion: *** Extension: *** Side Flexion:   R ***  L *** Rotation:  R *** L *** Side glide:  R *** L ***   NEUROLOGICAL  Upper Motor Neuron Screen Babinski, Hoffman's and Clonus (ankle) negative bilaterally.  Dermatomes C2-T1 appears equal and intact to light touch except the following: *** L2-S2 appears equal and intact to light touch except the following: *** Deep Tendon Reflexes R/L  ***+/***+ Biceps brachii reflex (C5, C6) ***+/***+ Brachioradialis reflex (C6) ***+/***+ Triceps brachii reflex (C7) ***+/***+ Quadriceps reflex (L4) ***+/***+ Achilles reflex (S1)  SPINE MOTION  CERVICAL SPINE AROM *Indicates pain Flexion: *** Extension: *** Side Flexion:   R ***  L *** Rotation:  R *** L ***   PERIPHERAL JOINT MOTION (in degrees)  ACTIVE RANGE OF MOTION (AROM) *Indicates pain Date Date Date  Joint/Motion R/L R/L R/L  Shoulder     Flexion / / /  Extension / / /  Abduction  / / /  External rotation / / /  Internal rotation / / /  Elbow     Flexion  / / /  Extension  / / /  Wrist     Flexion / / /  Extension  / / /  Radial deviation / / /  Ulnar deviation / / /  Pronation / / /  Supination / / /  Hip     Flexion / / /  Extension  / / /  Abduction / / /  Adduction / / /  External rotation / / /  Internal rotation  / / /  Knee     Extension / / /  Flexoin / / /  Ankle/Foot     Dorsiflexion (knee ext) / / /  Dorsiflexion (knee flex) / / /  Plantarflexion / / /  Everison / / /  Inversion / / /  Great toe extension / / /  Great toe  flexion / / /  Comments:   PASSIVE RANGE OF MOTION (PROM) *Indicates pain Date Date Date  Joint/Motion R/L R/L R/L  Shoulder     Flexion / / /  Extension / / /  Abduction  / / /  External rotation / / /  Internal rotation / / /  Elbow     Flexion  / / /  Extension  / / /  Wrist     Flexion / / /  Extension  / / /  Radial deviation / / /  Ulnar deviation / / /  Pronation / / /  Supination / / /  Hip     Flexion  / / /  Extension  / / /  Abduction / / /  Adduction / / /  External rotation / / /  Internal rotation  / / /  Knee     Extension / / /  Flexion / / /  Ankle/Foot     Dorsiflexion (knee ext) / / /  Dorsiflexion (knee flex) / / /  Plantarflexion / / /  Everison / / /  Inversion / / /  Great toe extension / / /  Great toe flexion / / /  Comments:   MUSCLE PERFORMANCE (MMT):  *Indicates pain Date Date Date  Joint/Motion R/L R/L R/L  Shoulder     Flexion / / /  Abduction (C5) / / /  External rotation / / /  Internal rotation / / /  Extension / / /  Elbow     Flexion (C6) / / /  Extension (C7) / / /  Wrist     Flexion (C7) / / /  Extension (C6) / / /  Radial deviation / / /  Ulnar deviation (C8) / / /  Pronation / / /  Supination / / /  Hand     Thumb extension (C8) / / /  Finger abduction (T1) / / /  Grip (C8) / / /  Hip     Flexion (L1, L2) / / /  Extension (knee ext) / / /  Extension (knee flex) / / /  Abduction / / /  Adduction / / /  External rotation / / /  Internal rotation  / / /  Knee     Extension (L3) / / /  Flexion (S2) / / /  Ankle/Foot     Dorsiflexion (L4) / / /  Great toe extension (L5) / / /  Eversion (S1) / / /  Plantarflexion (S1) / / /  Inversion / / /  Pronation / / /  Great toe flexion / / /  Comments:   SPECIAL TESTS:  .Neurodynamictests .NeurodynamicUE .NeurodynamicLE .CspineInstability .CSPINESPECIALTESTS .SHOULDERSPECIALTESTCLUSTERS .HIPSPECIALTESTS .SIJSPECIALTESTS   SHOULDER SPECIAL  TESTS RTC, Impingement, Anterior Instability (macrotrauma), Labral Tear: Painful arc test: R = ***, L = ***. Drop arm test: R = ***, L = ***. Hawkins-Kennedy test: R = ***, L = ***. Infraspinatus test: R = ***, L = ***. Apprehension test: R = ***, L = ***. Relocation test: R = ***, L = ***. Active compression test: R = ***, L = ***.  ACCESSORY MOTION: ***  PALPATION: ***  SUSTAINED POSITIONS TESTING:  ***  REPEATED MOTIONS TESTING: ***  FUNCTIONAL/BALANCE TESTS: Five Time Sit to Stand (5TSTS): *** seconds Functional Gait Assessment (FGA): ***/30 (see details above) Ten meter walking trial (10MWT): *** m/s Six Minute Walk Test (6MWT): *** feet Timed Up and Go (TUG): *** seconds   Dynamic Gait Index: ***/24 BERG Balance Scale: ***/56 Tinetti/POMA: ***/28 Timed Up and GO: *** seconds (average of 3 trials) Trial 1: *** Trial 2: *** Trial 3: *** Romberg test: -Narrow stance, eyes open: *** seconds -Narrow stance, eyes closed: *** seconds Sharpened Romberg test: -Tandem stance, eyes open: *** seconds -Tandem stance, eyes closed: *** seconds  Narrow stance, firm surface, eyes open: *** seconds Narrow stance, firm surface, eyes closed: *** seconds Narrow stance, compliant surface, eyes open: *** seconds Narrow stance, compliant surface, eyes closed: *** seconds Single leg stance, firm surface, eyes open: R= *** seconds, L= *** seconds Single leg stance, compliant surface, eyes open: R= *** seconds, L= *** seconds Gait speed: *** m/s Functional reach test: *** inches      TODAY'S TREATMENT:     PATIENT EDUCATION:  Education details: Education on diagnosis, prognosis, POC, anatomy and physiology of current condition.  Person educated: Patient Education method: Explanation Education comprehension: verbalized understanding and needs further education  HOME EXERCISE PROGRAM: ***  ASSESSMENT:  CLINICAL IMPRESSION: Patient is a 88 y.o. male referred to  outpatient physical therapy with a medical diagnosis of *** who presents with signs and symptoms consistent with ***. Patient presents with significant *** impairments that are limiting ability to complete *** without difficulty. Patient will benefit from skilled physical therapy intervention to address current body structure impairments and activity limitations to improve function and work towards goals set in current POC in order to return to prior level of function or maximal functional improvement.   OBJECTIVE IMPAIRMENTS: {opptimpairments:25111}.   ACTIVITY LIMITATIONS: {activitylimitations:27494}  PARTICIPATION LIMITATIONS: {participationrestrictions:25113}  PERSONAL FACTORS: {Personal factors:25162} are also affecting patient's functional outcome.   REHAB POTENTIAL: {rehabpotential:25112}  CLINICAL DECISION MAKING: {clinical decision making:25114}  EVALUATION COMPLEXITY: {Evaluation complexity:25115}   GOALS: Goals reviewed with patient? No  SHORT TERM GOALS: Target date: 03/31/2023  Patient will be independent with initial home exercise program for self-management of symptoms. Baseline: {HEPbaseline4:27310} (03/17/23); Goal status: INITIAL   LONG TERM GOALS: Target date: 06/09/2023  Patient will be independent with a long-term home exercise program for self-management of symptoms.  Baseline: {HEPbaseline4:27310} (03/17/23); Goal status: INITIAL  2.  Patient will demonstrate improved FOTO to equal or greater than *** by visit #*** to demonstrate improvement in overall condition and self-reported functional ability.  Baseline: *** (03/17/23); Goal status: INITIAL  3.  ***  Baseline: *** (03/17/23); Goal status: INITIAL  4.  *** Baseline: *** (03/17/23); Goal status: INITIAL  5.  Patient will complete community, work and/or recreational activities without limitation due to current condition.  Baseline: *** (03/17/23); Goal status: INITIAL  6.  *** Baseline:  *** Goal status: INITIAL  PLAN:  PT FREQUENCY: 1-2x/week  PT DURATION: 12 weeks  PLANNED INTERVENTIONS: Therapeutic exercises, Therapeutic activity, Neuromuscular re-education, Balance training, Gait training, Patient/Family education, Self Care, Joint mobilization, Stair training, DME instructions, Dry Needling, Electrical stimulation, Spinal mobilization, Cryotherapy, Moist heat, Manual therapy, and Re-evaluation.  PLAN FOR NEXT SESSION: ***   Nancy Nordmann, PT, DPT 03/17/2023, 5:16 PM

## 2023-03-18 ENCOUNTER — Encounter: Payer: Self-pay | Admitting: Physical Therapy

## 2023-03-18 ENCOUNTER — Ambulatory Visit: Payer: Medicare Other | Attending: Family Medicine | Admitting: Physical Therapy

## 2023-03-18 DIAGNOSIS — R2681 Unsteadiness on feet: Secondary | ICD-10-CM | POA: Diagnosis not present

## 2023-03-18 DIAGNOSIS — M48062 Spinal stenosis, lumbar region with neurogenic claudication: Secondary | ICD-10-CM | POA: Diagnosis not present

## 2023-03-18 DIAGNOSIS — M6281 Muscle weakness (generalized): Secondary | ICD-10-CM

## 2023-03-18 DIAGNOSIS — M4712 Other spondylosis with myelopathy, cervical region: Secondary | ICD-10-CM | POA: Diagnosis not present

## 2023-03-18 DIAGNOSIS — R2689 Other abnormalities of gait and mobility: Secondary | ICD-10-CM

## 2023-03-18 DIAGNOSIS — M5459 Other low back pain: Secondary | ICD-10-CM

## 2023-03-18 DIAGNOSIS — M4722 Other spondylosis with radiculopathy, cervical region: Secondary | ICD-10-CM | POA: Insufficient documentation

## 2023-03-18 DIAGNOSIS — R208 Other disturbances of skin sensation: Secondary | ICD-10-CM | POA: Diagnosis present

## 2023-03-22 ENCOUNTER — Encounter: Payer: Self-pay | Admitting: Physical Therapy

## 2023-03-22 ENCOUNTER — Ambulatory Visit: Payer: Medicare Other | Attending: Family Medicine | Admitting: Physical Therapy

## 2023-03-22 DIAGNOSIS — M6281 Muscle weakness (generalized): Secondary | ICD-10-CM | POA: Insufficient documentation

## 2023-03-22 DIAGNOSIS — M5459 Other low back pain: Secondary | ICD-10-CM | POA: Insufficient documentation

## 2023-03-22 DIAGNOSIS — R208 Other disturbances of skin sensation: Secondary | ICD-10-CM | POA: Diagnosis present

## 2023-03-22 DIAGNOSIS — R2689 Other abnormalities of gait and mobility: Secondary | ICD-10-CM | POA: Diagnosis present

## 2023-03-22 NOTE — Therapy (Signed)
OUTPATIENT PHYSICAL THERAPY TREATMENT NOTE   Patient Name: Mark Holmes MRN: [id number] DOB:April 16, 1935, 88 y.o., male Today's Date: 03/22/2023  PCP: Ria Bush, MD  REFERRING PROVIDER: Ria Bush, MD; also recommended by Jennings Books, MD (Neurology)   END OF SESSION:   PT End of Session - 03/22/23 1400     Visit Number 2    Number of Visits 12    Date for PT Re-Evaluation 04/29/23    Authorization Type [employer] MEDICARE reporting period from 03/18/2023    PT Start Time 1345    PT Stop Time 1425    PT Time Calculation (min) 40 min    Equipment Utilized During Treatment Gait belt    Activity Tolerance Patient tolerated treatment well    Behavior During Therapy WFL for tasks assessed/performed             Past Medical History:  Diagnosis Date   Benign prostatic hypertrophy with elevated PSA    per Dr. Jacqlyn Larsen   History of CT scan of brain 1992   normal   Hyperglycemia 01/2006   102   Hyperlipemia    Hypertension    Seizure disorder    Past Surgical History:  Procedure Laterality Date   ANTERIOR CERVICAL DECOMP/DISCECTOMY FUSION  05/2018   for myelopathy -C3/4 ([facility])   CARPAL TUNNEL RELEASE Right 06/2019   ([facility])   CATARACT EXTRACTION W/ INTRAOCULAR LENS IMPLANT Right 2012   [facility]   CATARACT EXTRACTION W/PHACO Left 01/14/2022   Procedure: CATARACT EXTRACTION PHACO AND INTRAOCULAR LENS PLACEMENT ([facility]) LEFT;  Surgeon: Leandrew Koyanagi, MD;  Location: [facility];  Service: Ophthalmology;  Laterality: Left;  7.55 01:03.6   History of EEG  1985   normal   hospitalization  1981   observation for seizuare   TONSILLECTOMY     TRANSURETHRAL RESECTION OF PROSTATE  10/2005   [place] RESECTION OF PROSTATE  08/2017   (rpt) [facility]   Patient Active Problem List   Diagnosis Date Noted   Spinal stenosis of lumbar region with neurogenic claudication 03/04/2023   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   Bilateral hearing loss 06/30/2022   Brain aneurysm 06/30/2022   History of ischemic stroke 06/20/2022   Nephrotic range proteinuria 06/04/2022   Hypoproteinemia 06/01/2022   Atypical chest pain 07/10/2020   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   Unsteadiness on feet 11/08/2019   History of spinal surgery 01/17/2019   Neck pain 01/17/2019   Weakness of hand 01/17/2019   BPH (benign prostatic hyperplasia) [id number]   Systolic murmur [id number]   Right carpal tunnel syndrome 02/02/2018   Ulnar neuropathy at elbow, left 02/02/2018   Peripheral neuropathy 01/26/2018   Bilateral buttock pain 06/29/2016   Failed vision screen 06/29/2016   Advanced care planning/counseling discussion 06/21/2015   Medicare annual wellness visit, subsequent 06/18/2014   Health maintenance examination 06/18/2014   Chronic ankle pain 06/18/2014   Incomplete emptying of bladder 06/07/2013   Vitamin D deficiency 05/20/2013   THYROID NODULE 05/17/2007   Mixed hyperlipidemia 05/17/2007   Essential tremor 05/17/2007   Essential hypertension 05/17/2007   Cervical spondylosis with myelopathy and radiculopathy 05/17/2007   Seizure disorder 05/17/2007    REFERRING DIAG: unsteadiness on feet, cervical spondylosis with myelopathy and radiculopathy, spinal stenosis of lumbar region with neurogenic claudication   THERAPY DIAG:  Other low back pain  Other disturbances of skin sensation  Other abnormalities of gait and mobility  Muscle weakness (generalized)  Rationale for Evaluation and  Treatment Rehabilitation  PERTINENT HISTORY: Patient is a 88 y.o. male who presents to outpatient physical therapy with a referral for medical diagnosis unsteadiness on feet, cervical spondylosis with myelopathy and radiculopathy, spinal stenosis of lumbar region with neurogenic claudication. This patient's chief complaints consist of burning and numbness in B lower legs as well as feeling of weakness in low back and lower  legs leading to the following functional deficits: difficulty with balance, walking, sleeping, difficulty with getting up and moving around, household and community mobility, going out in the community, housework, gardening, going to the store.. Relevant past medical history and comorbidities include CVA on 06/20/2022 (MRI showed punctate acute/subacute nonhemorrhagic infarct in the posterior left lower midbrain near the 4th cranial nerve nucleus with remote lacunar infarcts of the right thalamus and right cerebellum),  72mm PCOM aneurysm (found incidentally on CTA 06/20/2022, currently undergoing conservative monitoring), concern over dilantin related neuropathy (starting to wean off), systolic murmur, benign prostatic hyperplasia with urinary obstruction (followed by urology), chronic R ankle pain (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  Patient denies hx of cancer, lung problems, heart problems, diabetes, unexplained weight loss, unexplained changes in bowel or bladder problems, and osteoporosis.   PRECAUTIONS: fall  SUBJECTIVE:                                                                                                                                                                                      SUBJECTIVE STATEMENT:  Patient reports he feels numbness and tightness in his bilateral ankles. He states he will take his last dilantin pill on June 16th. He has not noticed any affects of getting of of the medication at this point.    PAIN:  Are you having  pain? NPRS unrated   OBJECTIVE  SELF-REPORTED FUNCTION FOTO score: 54/100 (NOC-Neuromuscular disorder questionnaire)  FUNCTIONAL/BALANCE TESTS 6 Minute Walk Test: 770 feet while holding SPC, antalgic gait favoring R LE. Reports feeling tight and weak in the low back and increased tightness in his lower legs and feeling like his back makes his legs weak.   Sahrmann Core Stability Test: Level 1 with some difficulty keeping back down, able to move legs in level 2 position but unable to keep low back down.    TODAY'S TREATMENT:  Therapeutic exercise: to centralize symptoms and improve ROM, strength, muscular endurance, and activity tolerance required for successful completion of functional activities.  - core testing with Sahrmann core stability test (see above). Needed extensive cuing to learn positions.  - curl up hold, 10 x 10 seconds - 6 minute walk test testing (see above).    Pt required multimodal cuing for proper technique and to facilitate improved neuromuscular control, strength, range of motion, and functional ability resulting in improved performance and form.     PATIENT EDUCATION:  Education details: Education on diagnosis, prognosis, POC, anatomy and physiology of current condition.  03/22/2023: Educated in PT request to not talk about PT's religious beliefs and not touching PT unnecessarily per PT preference in a professional setting. Patient states he understands and agrees.  Person educated: Patient Education method: Explanation Education comprehension: verbalized understanding and needs further education   HOME EXERCISE PROGRAM: Access Code: [place] URL: [email or web address] Date: 03/22/2023 Prepared by: Rosita Kea  Exercises - Neutral Curl Up with Arms Across Chest  - 1 x daily - 10 reps - 10 seconds hold   ASSESSMENT:   CLINICAL IMPRESSION: Patient arrives reporting continued symptoms no better or worse. Today's session included getting  baseline for FOTO questionnaire, 6 Minute Walk Test (6MWT), and core stability test. Patient required a lot of cuing to coordinate core test and exercise for lower abdominal strength, but was able to perform curl up correctly by end of session. He reported pain in the back of his neck that resolved with rest during the curl up and struggled to keep his head off the table while continuing to breathe. During 6MWT patient reported feeling if tightness in his low back and lower legs and when prompted described feeling weakness in his low back that made his low back feel weak. Continues to be unclear if symptoms of neurogenic claudication are contributing to his discomfort or if it is just from prolonged weight bearing. Patient would benefit from continued management of limiting condition by skilled physical therapist to address remaining impairments and functional limitations to work towards stated goals and return to PLOF or maximal functional independence.   From Initial PT Evaluation 03/18/2023:  Patient is a 88 y.o. male referred to outpatient physical therapy with a medical diagnosis of unsteadiness on feet, cervical spondylosis with myelopathy and radiculopathy, spinal stenosis of lumbar region with neurogenic claudication who presents with signs and symptoms consistent with severe sensory B LE neuropathy, with ataxia and imbalance related to multifactorial contributors such as previously identified and surgically addressed cervical myelopathy, evidence of prior lacunar infarcts in cerebellum, severe chronic R ankle osteoarthritis (as previously established). Patient does describe pattern of difficulty with mobility consistent with aspects of spinal stenosis with neurogenic claudication but he's excellent lumbar extension and report of improvement after repeated extensions decreases the likelihood of this condition in light of the other contributing factors. He does have positive SLR bilaterally, which is easily   negative in spinal stenosis with neurogenic claudication due to the position of the spine during testing, but can be positive in other types of sciatica. Of note, patient reported the symptoms the SLR reproduced were not consistent with his concordant sign. The most likely strongest contributing factor to patient's chief complaint of feeling of numbness and weakness with prolonged weight bearing is severe sensory neuropathy. His neurologist is working with him to wean from Dilantin that has been identified by his medical team as likely contributor to his neuropathy, but it is unclear to  this PT if he has potential for improvement once off of it or if it will only decrease/halt the speed of progression of neuropathy.  Patient was again educated that physical therapy has limited effects on improving unpleasant sensations or numbness from peripheral neuropathy, but can have some effect on improving strength, balance, and function. Physical therapy can also be effective in improving low back discomfort and strength. Patient would benefit from a bout of physical therapy to help address functional, balance, and lumbar stability/strength deficits he complains of and demonstrates during session..Patient presents with significant pain, paresthesia, muscle performance (strength/power/endurance), ataxia, coordination, joint stiffness, sensory, proprioceptive, knowledge, balance, gait, and activity tolerance  impairments that are limiting ability to complete usual activities including balance, walking, sleeping, difficulty with getting up and moving around, household and community mobility, going out in the community, housework, gardening, going to the store without difficulty. Patient will benefit from skilled physical therapy intervention to address current body structure impairments and activity limitations to improve function and work towards goals set in current POC in order to return to prior level of function or maximal  functional improvement.    OBJECTIVE IMPAIRMENTS: Abnormal gait, decreased activity tolerance, decreased balance, decreased coordination, decreased endurance, decreased knowledge of condition, decreased mobility, difficulty walking, decreased ROM, decreased strength, hypomobility, impaired perceived functional ability, impaired sensation, and pain.    ACTIVITY LIMITATIONS: carrying, lifting, sitting, standing, squatting, sleeping, stairs, transfers, dressing, and locomotion level   PARTICIPATION LIMITATIONS: meal prep, cleaning, interpersonal relationship, shopping, community activity, yard work, and   difficulty with balance, walking, sleeping, difficulty with getting up and moving around, household and community mobility, going out in the community, housework, gardening, going to the store   PERSONAL FACTORS: Behavior pattern, Past/current experiences, Time since onset of injury/illness/exacerbation, and 3+ comorbidities:   CVA on 06/20/2022 (MRI showed punctate acute/subacute nonhemorrhagic infarct in the posterior left lower midbrain near the 4th cranial nerve nucleus with remote lacunar infarcts of the right thalamus and right cerebellum),  75mm PCOM aneurysm (found incidentally on CTA 06/20/2022, currently undergoing conservative monitoring), concern over dilantin related neuropathy (starting to wean off), systolic murmur, benign prostatic hyperplasia with urinary obstruction (followed by urology), chronic R ankle pain (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  are also affecting patient's functional outcome.    REHAB POTENTIAL: Fair due to likelihood of neuropathy being the root cause of his chief complaint.   CLINICAL DECISION MAKING: Evolving/moderate complexity   EVALUATION COMPLEXITY: Moderate     GOALS: Goals reviewed with patient? No   SHORT TERM GOALS: Target date: 04/01/2023   Patient will be independent with initial home exercise program for self-management of symptoms. Baseline: Initial HEP to be provided at visit 2 as appropriate (03/18/23); Goal status: In-progress     LONG TERM GOALS: Target date: 04/29/2023    Patient will be independent with a long-term home exercise program for self-management of symptoms.  Baseline: Initial HEP to be provided at visit 2 as appropriate (03/18/23); Goal status: In-progress   2.  Patient will demonstrate improved FOTO by equal or greater than 10 points by visit #12 to demonstrate improvement  in overall condition and self-reported functional ability.  Baseline: emailed to patient (03/18/23); Goal status: In-progress   3.  Patient will score equal or greater than 25/30 on Functional Gait Assessment to demonstrate low fall risk.  Baseline: to be tested at visit 2 as appropriate (03/18/23); Goal status: In-progress   4.  Patient will ambulate equal or greater than 1370 feet on 6 Minute Walk Test with LRAD to equal norm for community dwelling older adults between ages 53-89 years and improve his community mobility and ability to shop at big box stores.  Baseline: to be tested visit 2 as appropriate (03/18/23); 1170 feet (03/22/2023);  Goal status: In-progress   5.  Patient will score equal or greater than Level 2 on Sahrmann Core Stability Test to demonstrate improved core strength and motor control to decrease feeling of weakness in the back and improve ability to ambulate in the community.  Baseline: To be tested at future date as appropriate (03/18/23); Level 1 (03/22/2023);  Goal status:  In-progress       PLAN:   PT FREQUENCY: 1-2x/week   PT DURATION: 6 weeks   PLANNED INTERVENTIONS: Therapeutic exercises, Therapeutic activity, Neuromuscular re-education, Balance training, Gait training, Patient/Family education, Self Care, Joint mobilization, Stair training, DME instructions, Dry Needling, Electrical stimulation, Spinal mobilization, Cryotherapy, Moist heat, Manual therapy, and Re-evaluation.   PLAN FOR NEXT SESSION: Progressive core/LE/functional strengthening and balance exercises; neurodynamic exercises and manual therapy as needed.    Nancy Nordmann, PT, DPT 03/22/2023, 2:50 PM  [facility] [address] [place],  [address] P: [phone] I F: [phone]

## 2023-03-24 ENCOUNTER — Ambulatory Visit: Payer: Medicare Other | Admitting: Physical Therapy

## 2023-03-24 ENCOUNTER — Encounter: Payer: Self-pay | Admitting: Physical Therapy

## 2023-03-24 DIAGNOSIS — M5459 Other low back pain: Secondary | ICD-10-CM

## 2023-03-24 DIAGNOSIS — R2689 Other abnormalities of gait and mobility: Secondary | ICD-10-CM

## 2023-03-24 DIAGNOSIS — R208 Other disturbances of skin sensation: Secondary | ICD-10-CM

## 2023-03-24 DIAGNOSIS — M6281 Muscle weakness (generalized): Secondary | ICD-10-CM

## 2023-03-24 NOTE — Therapy (Signed)
OUTPATIENT PHYSICAL THERAPY TREATMENT NOTE   Patient Name: Mark Holmes MRN: [id number] DOB:[birth year], 88 y.o., male Today's Date: 03/24/2023  PCP: Ria Bush, MD  REFERRING PROVIDER: Ria Bush, MD; also recommended by Jennings Books, MD (Neurology)   END OF SESSION:   PT End of Session - 03/24/23 1259     Visit Number 3    Number of Visits 12    Date for PT Re-Evaluation 04/29/23    Authorization Type [employer] MEDICARE reporting period from 03/18/2023    PT Start Time 1300    PT Stop Time 1340    PT Time Calculation (min) 40 min    Equipment Utilized During Treatment Gait belt    Activity Tolerance Patient tolerated treatment well    Behavior During Therapy WFL for tasks assessed/performed              Past Medical History:  Diagnosis Date   Benign prostatic hypertrophy with elevated PSA    per Dr. Jacqlyn Larsen   History of CT scan of brain 1992   normal   Hyperglycemia 01/2006   102   Hyperlipemia    Hypertension    Seizure disorder    Past Surgical History:  Procedure Laterality Date   ANTERIOR CERVICAL DECOMP/DISCECTOMY FUSION  05/2018   for myelopathy -C3/4 ([facility])   CARPAL TUNNEL RELEASE Right 06/2019   ([facility])   CATARACT EXTRACTION W/ INTRAOCULAR LENS IMPLANT Right 2012   [facility]   CATARACT EXTRACTION W/PHACO Left 01/14/2022   Procedure: CATARACT EXTRACTION PHACO AND INTRAOCULAR LENS PLACEMENT ([facility]) LEFT;  Surgeon: Leandrew Koyanagi, MD;  Location: [facility];  Service: Ophthalmology;  Laterality: Left;  7.55 01:03.6   History of EEG  1985   normal   hospitalization  1981   observation for seizuare   TONSILLECTOMY     TRANSURETHRAL RESECTION OF PROSTATE  10/2005   [place] RESECTION OF PROSTATE  08/2017   (rpt) [facility]   Patient Active Problem List   Diagnosis Date Noted   Spinal stenosis of lumbar region with neurogenic claudication 03/04/2023   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   Bilateral hearing loss 06/30/2022   Brain aneurysm 06/30/2022   History of ischemic stroke 06/20/2022   Nephrotic range proteinuria 06/04/2022   Hypoproteinemia 06/01/2022   Atypical chest pain 07/10/2020   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   Unsteadiness on feet 11/08/2019   History of spinal surgery 01/17/2019   Neck pain 01/17/2019   Weakness of hand 01/17/2019   BPH (benign prostatic hyperplasia) [id number]   Systolic murmur [id number]   Right carpal tunnel syndrome 02/02/2018   Ulnar neuropathy at elbow, left 02/02/2018   Peripheral neuropathy 01/26/2018   Bilateral buttock pain 06/29/2016   Failed vision screen 06/29/2016   Advanced care planning/counseling discussion 06/21/2015   Medicare annual wellness visit, subsequent 06/18/2014   Health maintenance examination 06/18/2014   Chronic ankle pain 06/18/2014   Incomplete emptying of bladder 06/07/2013   Vitamin D deficiency 05/20/2013   THYROID NODULE 05/17/2007   Mixed hyperlipidemia 05/17/2007   Essential tremor 05/17/2007   Essential hypertension 05/17/2007   Cervical spondylosis with myelopathy and radiculopathy 05/17/2007   Seizure disorder 05/17/2007    REFERRING DIAG: unsteadiness on feet, cervical spondylosis with myelopathy and radiculopathy, spinal stenosis of lumbar region with neurogenic claudication   THERAPY DIAG:  Other low back pain  Other disturbances of skin sensation  Other abnormalities of gait and mobility  Muscle weakness (generalized)  Rationale for Evaluation  and Treatment Rehabilitation  PERTINENT HISTORY: Patient is a 88 y.o. male who presents to outpatient physical therapy with a referral for medical diagnosis unsteadiness on feet, cervical spondylosis with myelopathy and radiculopathy, spinal stenosis of lumbar region with neurogenic claudication. This patient's chief complaints consist of burning and numbness in B lower legs as well as feeling of weakness in low back and lower  legs leading to the following functional deficits: difficulty with balance, walking, sleeping, difficulty with getting up and moving around, household and community mobility, going out in the community, housework, gardening, going to the store.. Relevant past medical history and comorbidities include CVA on 06/20/2022 (MRI showed punctate acute/subacute nonhemorrhagic infarct in the posterior left lower midbrain near the 4th cranial nerve nucleus with remote lacunar infarcts of the right thalamus and right cerebellum),  42mm PCOM aneurysm (found incidentally on CTA 06/20/2022, currently undergoing conservative monitoring), concern over dilantin related neuropathy (starting to wean off), systolic murmur, benign prostatic hyperplasia with urinary obstruction (followed by urology), chronic R ankle pain (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  Patient denies hx of cancer, lung problems, heart problems, diabetes, unexplained weight loss, unexplained changes in bowel or bladder problems, and osteoporosis.   PRECAUTIONS: fall  SUBJECTIVE:                                                                                                                                                                                      SUBJECTIVE STATEMENT:  Patient reports he continues to have stiffness in his ankles. He had some pain after last PT session in the left > right hip, but it has worn off now. He was doing some painting yesterday and he was on his hands or knees and lost his  balance, but no falling from standing.    PAIN:  Are you having pain? No just stiffness in B ankles.    OBJECTIVE  FUNCTIONAL/BALANCE TESTS   [facility] PT Assessment - 03/24/23 0001       Functional Gait  Assessment   Gait Level Surface Walks 20 ft in less than 5.5 sec, no assistive devices, good speed, no evidence for imbalance, normal gait pattern, deviates no more than 6 in outside of the 12 in walkway width.   fast gait: 472 seconds  Change in Gait Speed Able to smoothly change walking speed without loss of balance or gait deviation. Deviate no more than 6 in outside of the 12 in walkway width.    Gait with Horizontal Head Turns Performs head turns smoothly with slight change in gait velocity (eg, minor disruption to smooth gait path), deviates 6-10 in outside 12 in walkway width, or uses an assistive device.    Gait with Vertical Head Turns Performs head turns with no change in gait. Deviates no more than 6 in outside 12 in walkway width.    Gait and Pivot Turn Pivot turns safely within 3 sec and stops quickly with no loss of balance.    Step Over Obstacle Is able to step over one shoe box (4.5 in total height) without changing gait speed. No evidence of imbalance.    Gait with Narrow Base of Support Ambulates less than 4 steps heel to toe or cannot perform without assistance.    Gait with Eyes Closed Cannot walk 20 ft without assistance, severe gait deviations or imbalance, deviates greater than 15 in outside 12 in walkway width or will not attempt task.    Ambulating Backwards Walks 20 ft, uses assistive device, slower speed, mild gait deviations, deviates 6-10 in outside 12 in walkway width.    Steps Alternating feet, must use rail.    Total Score 20    FGA comment: 19-24 = medium risk fall             TODAY'S TREATMENT:   Neuromuscular Re-education: to improve, balance, postural strength, muscle activation patterns, and stabilization strength required for functional  activities: - Functional Gait Assessment to assess baseline (see above).  - floor <> stand transfer with supervision  Therapeutic exercise: to centralize symptoms and improve ROM, strength, muscular endurance, and activity tolerance required for successful completion of functional activities.  - core testing with Sahrmann core stability test (see above). Needed extensive cuing to learn positions.  - curl up hold, 3 x 10 seconds,  - curl up with LE at table top and hands reaching past legs, 2x10 seconds, 5x20 seconds.  - seated knee extension at [facility] machine, B LE, 3x10 at 15/20/25#, RPE 4-5/6/7.   Circuit:  - Quadruped bird dog (alternating shoulder flexion/contralateral hip extension with core muscles braced), 3x5 each side.  - child's pose, 3x20 seconds  Pt required multimodal cuing for proper technique and to facilitate improved neuromuscular control, strength, range of motion, and functional ability resulting in improved performance and form.     PATIENT EDUCATION:  Education details: Education on diagnosis, prognosis, POC, anatomy and physiology of current condition.  03/22/2023: Educated in PT request to not talk about PT's religious beliefs and not touching PT unnecessarily per PT preference in a professional setting. Patient states he understands and agrees.  Reviewed cancelation/no-show policy with patient and confirmed patient has correct phone number for clinic; patient verbalized understanding (03/24/23).  Person educated: Patient Education method: Explanation Education comprehension: verbalized understanding and needs further education   HOME EXERCISE PROGRAM: Access Code: [place] URL: [email or web address] Date: 03/24/2023 Prepared by: Rosita Kea  Exercises - The Hundred 3 Intermediate - Table Top  - 1 x daily - 10 reps - 20 seconds hold - Bird Dog  - 1 x daily - 3 sets - 5 reps - 5 second hold - Child's Pose Stretch  - 1 x daily - 3 reps - 20 seconds  hold   ASSESSMENT:   CLINICAL IMPRESSION: Patient arrives with similar  complaint of stiffness in his ankles and report of falling from a quadruped position while painting in the yard. Continued with progressive LE/core/functional strengthening and balance assessment/treatment. He scored 20/30 on Functional Gait Assessment which places him in moderate fall risk. Patient required cuing for improved form and motor control. Patient would benefit from continued management of limiting condition by skilled physical therapist to address remaining impairments and functional limitations to work towards stated goals and return to PLOF or maximal functional independence.   From Initial PT Evaluation 03/18/2023:  Patient is a 88 y.o. male referred to outpatient physical therapy with a medical diagnosis of unsteadiness on feet, cervical spondylosis with myelopathy and radiculopathy, spinal stenosis of lumbar region with neurogenic claudication who presents with signs and symptoms consistent with severe sensory B LE neuropathy, with ataxia and imbalance related to multifactorial contributors such as previously identified and surgically addressed cervical myelopathy, evidence of prior lacunar infarcts in cerebellum, severe chronic R ankle osteoarthritis (as previously established). Patient does describe pattern of difficulty with mobility consistent with aspects of spinal stenosis with neurogenic claudication but he's excellent lumbar extension and report of improvement after repeated extensions decreases the likelihood of this condition in light of the other contributing factors. He does have positive SLR bilaterally, which is easily  negative in spinal stenosis with neurogenic claudication due to the position of the spine during testing, but can be positive in other types of sciatica. Of note, patient reported the symptoms the SLR reproduced were not consistent with his concordant sign. The most likely strongest  contributing factor to patient's chief complaint of feeling of numbness and weakness with prolonged weight bearing is severe sensory neuropathy. His neurologist is working with him to wean from Dilantin that has been identified by his medical team as likely contributor to his neuropathy, but it is unclear to this PT if he has potential for improvement once off of it or if it will only decrease/halt the speed of progression of neuropathy.  Patient was again educated that physical therapy has limited effects on improving unpleasant sensations or numbness from peripheral neuropathy, but can have some effect on improving strength, balance, and function. Physical therapy can also be effective in improving low back discomfort and strength. Patient would benefit from a bout of physical therapy to help address functional, balance, and lumbar stability/strength deficits he complains of and demonstrates during session..Patient presents with significant pain, paresthesia, muscle performance (strength/power/endurance), ataxia, coordination, joint stiffness, sensory, proprioceptive, knowledge, balance, gait, and activity tolerance  impairments that are limiting ability to complete usual activities including balance, walking, sleeping, difficulty with getting up and moving around, household and community mobility, going out in the community, housework, gardening, going to the store without difficulty. Patient will benefit from skilled physical therapy intervention to address current body structure impairments and activity limitations to improve function and work towards goals set in current POC in order to return to prior level of function or maximal functional improvement.    OBJECTIVE IMPAIRMENTS: Abnormal gait, decreased activity tolerance, decreased balance, decreased coordination, decreased endurance, decreased knowledge of condition, decreased mobility, difficulty walking, decreased ROM, decreased strength, hypomobility,  impaired perceived functional ability, impaired sensation, and pain.    ACTIVITY LIMITATIONS: carrying, lifting, sitting, standing, squatting, sleeping, stairs, transfers, dressing, and locomotion level   PARTICIPATION LIMITATIONS: meal prep, cleaning, interpersonal relationship, shopping, community activity, yard work, and   difficulty with balance, walking, sleeping, difficulty with getting up and moving around, household and community mobility, going out in the  community, housework, gardening, going to the store   PERSONAL FACTORS: Behavior pattern, Past/current experiences, Time since onset of injury/illness/exacerbation, and 3+ comorbidities:   CVA on 06/20/2022 (MRI showed punctate acute/subacute nonhemorrhagic infarct in the posterior left lower midbrain near the 4th cranial nerve nucleus with remote lacunar infarcts of the right thalamus and right cerebellum),  37mm PCOM aneurysm (found incidentally on CTA 06/20/2022, currently undergoing conservative monitoring), concern over dilantin related neuropathy (starting to wean off), systolic murmur, benign prostatic hyperplasia with urinary obstruction (followed by urology), chronic R ankle pain (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 are also affecting patient's functional outcome.    REHAB POTENTIAL: Fair due to likelihood of neuropathy being the root cause of his chief complaint.   CLINICAL DECISION MAKING: Evolving/moderate complexity   EVALUATION COMPLEXITY: Moderate     GOALS: Goals reviewed with  patient? No   SHORT TERM GOALS: Target date: 04/01/2023   Patient will be independent with initial home exercise program for self-management of symptoms. Baseline: Initial HEP to be provided at visit 2 as appropriate (03/18/23); Goal status: In-progress     LONG TERM GOALS: Target date: 04/29/2023    Patient will be independent with a long-term home exercise program for self-management of symptoms.  Baseline: Initial HEP to be provided at visit 2 as appropriate (03/18/23); Goal status: In-progress   2.  Patient will demonstrate improved FOTO by equal or greater than 10 points by visit #12 to demonstrate improvement in overall condition and self-reported functional ability.  Baseline: emailed to patient (03/18/23); Goal status: In-progress   3.  Patient will score equal or greater than 25/30 on Functional Gait Assessment to demonstrate low fall risk.  Baseline: to be tested at visit 2 as appropriate (03/18/23); Goal status: In-progress   4.  Patient will ambulate equal or greater than 1370 feet on 6 Minute Walk Test with LRAD to equal norm for community dwelling older adults between ages 91-89 years and improve his community mobility and ability to shop at big box stores.  Baseline: to be tested visit 2 as appropriate (03/18/23); 1170 feet (03/22/2023);  Goal status: In-progress   5.  Patient will score equal or greater than Level 2 on Sahrmann Core Stability Test to demonstrate improved core strength and motor control to decrease feeling of weakness in the back and improve ability to ambulate in the community.  Baseline: To be tested at future date as appropriate (03/18/23); Level 1 (03/22/2023);  Goal status: In-progress       PLAN:   PT FREQUENCY: 1-2x/week   PT DURATION: 6 weeks   PLANNED INTERVENTIONS: Therapeutic exercises, Therapeutic activity, Neuromuscular re-education, Balance training, Gait training, Patient/Family education, Self Care, Joint mobilization, Stair training,  DME instructions, Dry Needling, Electrical stimulation, Spinal mobilization, Cryotherapy, Moist heat, Manual therapy, and Re-evaluation.   PLAN FOR NEXT SESSION: Progressive core/LE/functional strengthening and balance exercises; neurodynamic exercises and manual therapy as needed.    Nancy Nordmann, PT, DPT 03/24/2023, 1:46 PM  [facility] [address] [place], [place] [address] P: [phone] I F: [phone]

## 2023-03-30 ENCOUNTER — Encounter: Payer: Self-pay | Admitting: Physical Therapy

## 2023-03-30 ENCOUNTER — Ambulatory Visit: Payer: Medicare Other | Admitting: Physical Therapy

## 2023-03-30 DIAGNOSIS — M5459 Other low back pain: Secondary | ICD-10-CM

## 2023-03-30 DIAGNOSIS — R2689 Other abnormalities of gait and mobility: Secondary | ICD-10-CM

## 2023-03-30 DIAGNOSIS — M6281 Muscle weakness (generalized): Secondary | ICD-10-CM

## 2023-03-30 DIAGNOSIS — R208 Other disturbances of skin sensation: Secondary | ICD-10-CM

## 2023-03-30 NOTE — Therapy (Signed)
OUTPATIENT PHYSICAL THERAPY TREATMENT NOTE   Patient Name: Mark Holmes MRN: [id number] DOB:01/04/1935, 88 y.o., male Today's Date: 03/30/2023  PCP: Eustaquio Boyden, MD  REFERRING PROVIDER: Eustaquio Boyden, MD; also recommended by Cristopher Peru, MD (Neurology)   END OF SESSION:   PT End of Session - 03/30/23 1617     Visit Number 4    Number of Visits 12    Date for PT Re-Evaluation 04/29/23    Authorization Type [employer] MEDICARE reporting period from 03/18/2023    PT Start Time 1602    PT Stop Time 1645    PT Time Calculation (min) 43 min    Equipment Utilized During Treatment Gait belt    Activity Tolerance Patient tolerated treatment well;Patient limited by fatigue;No increased pain    Behavior During Therapy WFL for tasks assessed/performed             Past Medical History:  Diagnosis Date   Benign prostatic hypertrophy with elevated PSA    per Dr. Achilles Dunk   History of CT scan of brain 1992   normal   Hyperglycemia 01/2006   102   Hyperlipemia    Hypertension    Seizure disorder    Past Surgical History:  Procedure Laterality Date   ANTERIOR CERVICAL DECOMP/DISCECTOMY FUSION  05/2018   for myelopathy -C3/4 ([facility])   CARPAL TUNNEL RELEASE Right 06/2019   ([facility])   CATARACT EXTRACTION W/ INTRAOCULAR LENS IMPLANT Right 2012   [place] [facility]   CATARACT EXTRACTION W/PHACO Left 01/14/2022   Procedure: CATARACT EXTRACTION PHACO AND INTRAOCULAR LENS PLACEMENT (IOC) LEFT;  Surgeon: Lockie Mola, MD;  Location: [facility];  Service: Ophthalmology;  Laterality: Left;  7.55 01:03.6   History of EEG  1985   normal   hospitalization  1981   observation for seizuare   TONSILLECTOMY     TRANSURETHRAL RESECTION OF PROSTATE  10/2005   [facility]   TRANSURETHRAL RESECTION OF PROSTATE  08/2017   (rpt) [facility]   Patient Active Problem List   Diagnosis Date Noted   Spinal stenosis of lumbar region with neurogenic claudication  03/04/2023   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   Bilateral hearing loss 06/30/2022   Brain aneurysm 06/30/2022   History of ischemic stroke 06/20/2022   Nephrotic range proteinuria 06/04/2022   Hypoproteinemia 06/01/2022   Atypical chest pain 07/10/2020   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   Unsteadiness on feet 11/08/2019   History of spinal surgery 01/17/2019   Neck pain 01/17/2019   Weakness of hand 01/17/2019   BPH (benign prostatic hyperplasia) 01/17/2019   Systolic murmur 07/04/2018   Right carpal tunnel syndrome 02/02/2018   Ulnar neuropathy at elbow, left 02/02/2018   Peripheral neuropathy 01/26/2018   Bilateral buttock pain 06/29/2016   Failed vision screen 06/29/2016   Advanced care planning/counseling discussion 06/21/2015   Medicare annual wellness visit, subsequent 06/18/2014   Health maintenance examination 06/18/2014   Chronic ankle pain 06/18/2014   Incomplete emptying of bladder 06/07/2013   Vitamin D deficiency 05/20/2013   THYROID NODULE 05/17/2007   Mixed hyperlipidemia 05/17/2007   Essential tremor 05/17/2007   Essential hypertension 05/17/2007   Cervical spondylosis with myelopathy and radiculopathy 05/17/2007   Seizure disorder 05/17/2007    REFERRING DIAG: unsteadiness on feet, cervical spondylosis with myelopathy and radiculopathy, spinal stenosis of lumbar region with neurogenic claudication   THERAPY DIAG:  Other low back pain  Other disturbances of skin sensation  Other abnormalities of gait and mobility  Muscle weakness (generalized)  Rationale for Evaluation and Treatment Rehabilitation  PERTINENT HISTORY: Patient is a 88 y.o. male who presents to outpatient physical therapy with a referral for medical diagnosis unsteadiness on feet, cervical spondylosis with myelopathy and radiculopathy, spinal stenosis of lumbar region with neurogenic claudication. This patient's chief complaints consist of burning and numbness in B lower legs as well  as feeling of weakness in low back and lower legs leading to the following functional deficits: difficulty with balance, walking, sleeping, difficulty with getting up and moving around, household and community mobility, going out in the community, housework, gardening, going to the store.. Relevant past medical history and comorbidities include CVA on 06/20/2022 (MRI showed punctate acute/subacute nonhemorrhagic infarct in the posterior left lower midbrain near the 4th cranial nerve nucleus with remote lacunar infarcts of the right thalamus and right cerebellum),  67mm PCOM aneurysm (found incidentally on CTA 06/20/2022, currently undergoing conservative monitoring), concern over dilantin related neuropathy (starting to wean off), systolic murmur, benign prostatic hyperplasia with urinary obstruction (followed by urology), chronic R ankle pain (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  Patient denies hx of cancer, lung problems, heart problems, diabetes, unexplained weight loss, unexplained changes in bowel or bladder problems, and osteoporosis.   PRECAUTIONS: fall  SUBJECTIVE:                                                                                                                                                                                      SUBJECTIVE STATEMENT:  Patient arrives with [facility] and states he feels his core is getting stronger so it is easier for him to make the bed. He denies falls since last PT session and no pain upon arrival.    PAIN:   NPRS 0/10   OBJECTIVE  TODAY'S TREATMENT:    Therapeutic exercise: to centralize symptoms and improve ROM, strength, muscular endurance, and activity tolerance required for successful completion of functional activities.  - curl up with LE at table top and hands reaching past legs, 5x30 seconds.  - seated knee extension at [facility] machine, B LE, 1x20 at 25#, 1x13 with 35#, 2x10 at 45#   - prone <> stand floor transfer.  - bear plank hold ~ 20 seconds - attempted bear plank pull through (too hard).  - Quadruped bird dog (  alternating shoulder flexion/contralateral hip extension with core muscles braced), 1x10 each side.  - quadruped bird dog with band, 2x10 each side attempting not to touch knee and hand down on mat. 1x5 with RTB loop (left foot right arm - discontinued due to poor band control and form).  - modified burpee going from stand with hands overhead <> prone lying, 1x10.  - introduction to turkish get up plus practice on right side for first two positions, 3 reps.  - Education on HEP including handout   Pt required multimodal cuing for proper technique and to facilitate improved neuromuscular control, strength, range of motion, and functional ability resulting in improved performance and form.     PATIENT EDUCATION:  Education details: Education on diagnosis, prognosis, POC, anatomy and physiology of current condition.  03/22/2023: Educated in PT request to not talk about PT's religious beliefs and not touching PT unnecessarily per PT preference in a professional setting. Patient states he understands and agrees.  Reviewed cancelation/no-show policy with patient and confirmed patient has correct phone number for clinic; patient verbalized understanding (03/24/23).  Person educated: Patient Education method: Explanation Education comprehension: verbalized understanding and needs further education   HOME EXERCISE PROGRAM: Access Code: [email or web address] URL:  [email or web address] Date: 03/30/2023 Prepared by: Norton BlizzardSara Lashaun Poch  Exercises - The Hundred 3 Intermediate - Table Top  - 1 x daily - 10 reps - 30 seconds hold - Bird Dog with Resistance  - 1 x daily - 2-3 sets - 10 reps - Child's Pose Stretch  - 1 x daily - 3 reps - 20 seconds hold - Bear Walk  - 1 x daily - 3 sets - 5 reps   ASSESSMENT:   CLINICAL IMPRESSION: Patient arrives reporting he is feeling stronger with housework. Today's session focused on continued progression for core and functional activity and balance. Patient spends time on the ground working in his yard and floor and mat exercise to improve core and limb strength and work capacity as well as coordination were incorporated into session to improve his safety and function. Patient was not strong enough to complete all parts of the turkish get up and had difficulty with bear walks/shoulder taps. He completed the session with report of appropriate fatigue and that he felt better than he arrived. He did not demonstrate loss of balance that required PT physical assist but continues to have ataxic gait and limitations due to right ankle pain. He continues to require PT assistance with exercise selection, instruction for form, and guarding for safety. Patient would benefit from continued management of limiting condition by skilled physical therapist to address remaining impairments and functional limitations to work towards stated goals and return to PLOF or maximal functional independence.   From Initial PT Evaluation 03/18/2023:  Patient is a 88 y.o. male referred to outpatient physical therapy with a medical diagnosis of unsteadiness on feet, cervical spondylosis with myelopathy and radiculopathy, spinal stenosis of lumbar region with neurogenic claudication who presents with signs and symptoms consistent with severe sensory B LE neuropathy, with ataxia and imbalance related to multifactorial contributors such as previously  identified and surgically addressed cervical myelopathy, evidence of prior lacunar infarcts in cerebellum, severe chronic R ankle osteoarthritis (as previously established). Patient does describe pattern of difficulty with mobility consistent with aspects of spinal stenosis with neurogenic claudication but he's excellent lumbar extension and report of improvement after repeated extensions decreases the likelihood of this condition in light of the other contributing factors. He does have positive SLR bilaterally,  which is easily  negative in spinal stenosis with neurogenic claudication due to the position of the spine during testing, but can be positive in other types of sciatica. Of note, patient reported the symptoms the SLR reproduced were not consistent with his concordant sign. The most likely strongest contributing factor to patient's chief complaint of feeling of numbness and weakness with prolonged weight bearing is severe sensory neuropathy. His neurologist is working with him to wean from Dilantin that has been identified by his medical team as likely contributor to his neuropathy, but it is unclear to this PT if he has potential for improvement once off of it or if it will only decrease/halt the speed of progression of neuropathy.  Patient was again educated that physical therapy has limited effects on improving unpleasant sensations or numbness from peripheral neuropathy, but can have some effect on improving strength, balance, and function. Physical therapy can also be effective in improving low back discomfort and strength. Patient would benefit from a bout of physical therapy to help address functional, balance, and lumbar stability/strength deficits he complains of and demonstrates during session..Patient presents with significant pain, paresthesia, muscle performance (strength/power/endurance), ataxia, coordination, joint stiffness, sensory, proprioceptive, knowledge, balance, gait, and activity  tolerance  impairments that are limiting ability to complete usual activities including balance, walking, sleeping, difficulty with getting up and moving around, household and community mobility, going out in the community, housework, gardening, going to the store without difficulty. Patient will benefit from skilled physical therapy intervention to address current body structure impairments and activity limitations to improve function and work towards goals set in current POC in order to return to prior level of function or maximal functional improvement.    OBJECTIVE IMPAIRMENTS: Abnormal gait, decreased activity tolerance, decreased balance, decreased coordination, decreased endurance, decreased knowledge of condition, decreased mobility, difficulty walking, decreased ROM, decreased strength, hypomobility, impaired perceived functional ability, impaired sensation, and pain.    ACTIVITY LIMITATIONS: carrying, lifting, sitting, standing, squatting, sleeping, stairs, transfers, dressing, and locomotion level   PARTICIPATION LIMITATIONS: meal prep, cleaning, interpersonal relationship, shopping, community activity, yard work, and   difficulty with balance, walking, sleeping, difficulty with getting up and moving around, household and community mobility, going out in the community, housework, gardening, going to the store   PERSONAL FACTORS: Behavior pattern, Past/current experiences, Time since onset of injury/illness/exacerbation, and 3+ comorbidities:   CVA on 06/20/2022 (MRI showed punctate acute/subacute nonhemorrhagic infarct in the posterior left lower midbrain near the 4th cranial nerve nucleus with remote lacunar infarcts of the right thalamus and right cerebellum),  4mm PCOM aneurysm (found incidentally on CTA 06/20/2022, currently undergoing conservative monitoring), concern over dilantin related neuropathy (starting to wean off), systolic murmur, benign prostatic hyperplasia with urinary obstruction  (followed by urology), chronic R ankle pain (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 are also affecting patient's functional outcome.    REHAB POTENTIAL: Fair due to likelihood of neuropathy being the root cause of his chief complaint.   CLINICAL DECISION MAKING: Evolving/moderate complexity   EVALUATION COMPLEXITY: Moderate     GOALS: Goals reviewed with patient? No   SHORT TERM GOALS:  Target date: 04/01/2023   Patient will be independent with initial home exercise program for self-management of symptoms. Baseline: Initial HEP to be provided at visit 2 as appropriate (03/18/23); Goal status: In-progress     LONG TERM GOALS: Target date: 04/29/2023    Patient will be independent with a long-term home exercise program for self-management of symptoms.  Baseline: Initial HEP to be provided at visit 2 as appropriate (03/18/23); Goal status: In-progress   2.  Patient will demonstrate improved FOTO by equal or greater than 10 points by visit #12 to demonstrate improvement in overall condition and self-reported functional ability.  Baseline: emailed to patient (03/18/23); Goal status: In-progress   3.  Patient will score equal or greater than 25/30 on Functional Gait Assessment to demonstrate low fall risk.  Baseline: to be tested at visit 2 as appropriate (03/18/23); Goal status: In-progress   4.  Patient will ambulate equal or greater than 1370 feet on 6 Minute Walk Test with LRAD to equal norm for community dwelling older adults between ages  63-89 years and improve his community mobility and ability to shop at big box stores.  Baseline: to be tested visit 2 as appropriate (03/18/23); 1170 feet (03/22/2023);  Goal status: In-progress   5.  Patient will score equal or greater than Level 2 on [place]ann Core Stability Test to demonstrate improved core strength and motor control to decrease feeling of weakness in the back and improve ability to ambulate in the community.  Baseline: To be tested at future date as appropriate (03/18/23); Level 1 (03/22/2023);  Goal status: In-progress       PLAN:   PT FREQUENCY: 1-2x/week   PT DURATION: 6 weeks   PLANNED INTERVENTIONS: Therapeutic exercises, Therapeutic activity, Neuromuscular re-education, Balance training, Gait training, Patient/Family education, Self Care, Joint mobilization, Stair training, DME instructions, Dry Needling, Electrical stimulation, Spinal mobilization, Cryotherapy, Moist heat, Manual therapy, and Re-evaluation.   PLAN FOR NEXT SESSION: Progressive core/LE/functional strengthening and balance exercises; neurodynamic exercises and manual therapy as needed.    Cira Rue, PT, DPT 03/30/2023, 5:21 PM  [facility] [address] [place], [place] [address] P: [phone] I F: [phone]

## 2023-04-06 ENCOUNTER — Ambulatory Visit: Payer: Medicare Other | Admitting: Physical Therapy

## 2023-04-06 ENCOUNTER — Encounter: Payer: Self-pay | Admitting: Physical Therapy

## 2023-04-06 DIAGNOSIS — M5459 Other low back pain: Secondary | ICD-10-CM | POA: Diagnosis not present

## 2023-04-06 DIAGNOSIS — R2689 Other abnormalities of gait and mobility: Secondary | ICD-10-CM

## 2023-04-06 DIAGNOSIS — M6281 Muscle weakness (generalized): Secondary | ICD-10-CM

## 2023-04-06 DIAGNOSIS — R208 Other disturbances of skin sensation: Secondary | ICD-10-CM

## 2023-04-06 NOTE — Therapy (Signed)
OUTPATIENT PHYSICAL THERAPY TREATMENT NOTE   Patient Name: Mark Holmes MRN: [id number] DOB:12/09/1935, 88 y.o., male Today's Date: 04/06/2023  PCP: Eustaquio Boyden, MD  REFERRING PROVIDER: Eustaquio Boyden, MD; also recommended by Cristopher Peru, MD (Neurology)   END OF SESSION:   PT End of Session - 04/06/23 1641     Visit Number 5    Number of Visits 12    Date for PT Re-Evaluation 04/29/23    Authorization Type [employer] MEDICARE reporting period from 03/18/2023    Progress Note Due on Visit 10    PT Start Time 1547    PT Stop Time 1635    PT Time Calculation (min) 48 min    Activity Tolerance Patient tolerated treatment well;Patient limited by fatigue;No increased pain    Behavior During Therapy WFL for tasks assessed/performed              Past Medical History:  Diagnosis Date   Benign prostatic hypertrophy with elevated PSA    per Dr. Achilles Dunk   History of CT scan of brain 1992   normal   Hyperglycemia 01/2006   102   Hyperlipemia    Hypertension    Seizure disorder    Past Surgical History:  Procedure Laterality Date   ANTERIOR CERVICAL DECOMP/DISCECTOMY FUSION  05/2018   for myelopathy -C3/4 ([facility])   CARPAL TUNNEL RELEASE Right 06/2019   ([facility])   CATARACT EXTRACTION W/ INTRAOCULAR LENS IMPLANT Right 2012   [facility]   CATARACT EXTRACTION W/PHACO Left 01/14/2022   Procedure: CATARACT EXTRACTION PHACO AND INTRAOCULAR LENS PLACEMENT (IOC) LEFT;  Surgeon: Lockie Mola, MD;  Location: [facility];  Service: Ophthalmology;  Laterality: Left;  7.55 01:03.6   History of EEG  1985   normal   hospitalization  1981   observation for seizuare   TONSILLECTOMY     TRANSURETHRAL RESECTION OF PROSTATE  10/2005   [facility]   TRANSURETHRAL RESECTION OF PROSTATE  08/2017   (rpt) [facility]   Patient Active Problem List   Diagnosis Date Noted   Spinal stenosis of lumbar region with neurogenic claudication 03/04/2023    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   Bilateral hearing loss 06/30/2022   Brain aneurysm 06/30/2022   History of ischemic stroke 06/20/2022   Nephrotic range proteinuria 06/04/2022   Hypoproteinemia 06/01/2022   Atypical chest pain 07/10/2020   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   Unsteadiness on feet 11/08/2019   History of spinal surgery 01/17/2019   Neck pain 01/17/2019   Weakness of hand 01/17/2019   BPH (benign prostatic hyperplasia) 01/17/2019   Systolic murmur 07/04/2018   Right carpal tunnel syndrome 02/02/2018   Ulnar neuropathy at elbow, left 02/02/2018   Peripheral neuropathy 01/26/2018   Bilateral buttock pain 06/29/2016   Failed vision screen 06/29/2016   Advanced care planning/counseling discussion 06/21/2015   Medicare annual wellness visit, subsequent 06/18/2014   Health maintenance examination 06/18/2014   Chronic ankle pain 06/18/2014   Incomplete emptying of bladder 06/07/2013   Vitamin D deficiency 05/20/2013   THYROID NODULE 05/17/2007   Mixed hyperlipidemia 05/17/2007   Essential tremor 05/17/2007   Essential hypertension 05/17/2007   Cervical spondylosis with myelopathy and radiculopathy 05/17/2007   Seizure disorder 05/17/2007    REFERRING DIAG: unsteadiness on feet, cervical spondylosis with myelopathy and radiculopathy, spinal stenosis of lumbar region with neurogenic claudication   THERAPY DIAG:  Other low back pain  Other disturbances of skin sensation  Other abnormalities of gait and mobility  Muscle weakness (  generalized)  Rationale for Evaluation and Treatment Rehabilitation  PERTINENT HISTORY: Patient is a 88 y.o. male who presents to outpatient physical therapy with a referral for medical diagnosis unsteadiness on feet, cervical spondylosis with myelopathy and radiculopathy, spinal stenosis of lumbar region with neurogenic claudication. This patient's chief complaints consist of burning and numbness in B lower legs as well as feeling of  weakness in low back and lower legs leading to the following functional deficits: difficulty with balance, walking, sleeping, difficulty with getting up and moving around, household and community mobility, going out in the community, housework, gardening, going to the store.. 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  Patient denies hx of cancer, lung problems, heart problems, diabetes, unexplained weight loss, unexplained changes in bowel or bladder problems, and osteoporosis.   PRECAUTIONS: fall  SUBJECTIVE:                                                                                                                                                                                      SUBJECTIVE STATEMENT:  Patient arrives with [facility] and states he is feeling pretty good. He states he has some tightness in his ankles. He has been practicing the bear crawl and it was initially very challenging. He has mostly been able  to hold it for 30 seconds and take a few baby steps.    PAIN:  NPRS 0/10   OBJECTIVE  TODAY'S TREATMENT:    Therapeutic exercise: to centralize symptoms and improve ROM, strength, muscular endurance, and activity tolerance required for successful completion of functional activities.  - curl up with LE at table top and hands reaching past legs, 5x30 seconds. Cuing for slight hip and knee extension and head placement.  - seated knee extension at [facility] machine, B LE, 1x10 with 35#, 2x10 at 45#, 1x15 (AMRAP) at 45#.  -  Part practice of turkish get up:  - step 1 (supine to propped on hand) 1x10 each side holding 2#DB (5#DB on first 5 on R but too heavy).  - step 2 (prop to leg back) unable to perform effectively in original form, 1x10 each side with extra help from 2nd leg. No weight. Plus a few reps each side to remember how to do modification later in session.  - split squat with 4 inch yoga block under knee, U UE support at TM bar, ipsilateral UE overhead, no weight.   - bear plank hold ~ 20 seconds - review of modified burpee  - Education on HEP including handout   Pt required multimodal cuing for proper technique and to facilitate improved neuromuscular control, strength, range of motion, and functional ability resulting in improved performance and form.     PATIENT EDUCATION:  Education details: Education on diagnosis, prognosis, POC, anatomy and physiology of current condition.  03/22/2023: Educated in PT request to not talk about PT's religious beliefs and not touching PT unnecessarily per PT preference in a professional setting. Patient states he understands and agrees.  Reviewed cancelation/no-show policy with patient and confirmed patient has correct phone number for clinic; patient verbalized understanding (03/24/23).  Person educated: Patient Education method: Explanation Education comprehension: verbalized understanding and needs further education   HOME EXERCISE  PROGRAM: Access Code: [email or web address] URL: [email or web address] Date: 03/30/2023 Prepared by: Norton Blizzard  Exercises - The Hundred 3 Intermediate - Table Top  - 1 x daily - 10 reps - 30 seconds hold - Bird Dog with Resistance  - 1 x daily - 2-3 sets - 10 reps - Child's Pose Stretch  - 1 x daily - 3 reps - 20 seconds hold - Bear Walk  - 1 x daily - 3 sets - 5 reps  HOME EXERCISE PROGRAM [[email or web address]] View at [email or web address]" using code: [email or web address]  Split squat from floor -  Repeat 10 Repetitions, Perform 3 Times a Week  Modified Burpee -  Repeat 10 Repetitions, Complete 2 Sets, Perform 3 Times a Week   ASSESSMENT:   CLINICAL IMPRESSION: Patient arrives reporting he is feeling well and with good tolerance to last PT session. Continued working on LE, core, and UE strength, balance, and motor control to improve patient's balance and ability to perform functional activities with less incoordination and risk of falling. Patient found parts of turkish get up challenging but is highly motivated to learn and perform the exercise. Patient would benefit from continued management of limiting condition by skilled physical therapist to address remaining impairments and functional limitations to work towards stated goals and return to PLOF or maximal functional independence.    From Initial PT Evaluation 03/18/2023:  Patient is a 88 y.o. male referred to outpatient physical therapy with a medical diagnosis of unsteadiness on feet, cervical spondylosis with myelopathy and radiculopathy, spinal stenosis of lumbar region with neurogenic claudication who presents with signs and symptoms consistent with severe sensory B LE neuropathy, with ataxia and imbalance related to multifactorial contributors such as previously identified and surgically addressed cervical myelopathy, evidence of prior lacunar infarcts in cerebellum, severe chronic R ankle osteoarthritis (as previously established). Patient does  describe pattern of difficulty with mobility consistent with aspects of spinal stenosis with neurogenic claudication but he's excellent lumbar extension and report of improvement after repeated extensions decreases the likelihood of this condition in light of the other contributing factors. He does have positive SLR bilaterally, which is easily  negative in spinal stenosis with  neurogenic claudication due to the position of the spine during testing, but can be positive in other types of sciatica. Of note, patient reported the symptoms the SLR reproduced were not consistent with his concordant sign. The most likely strongest contributing factor to patient's chief complaint of feeling of numbness and weakness with prolonged weight bearing is severe sensory neuropathy. His neurologist is working with him to wean from Dilantin that has been identified by his medical team as likely contributor to his neuropathy, but it is unclear to this PT if he has potential for improvement once off of it or if it will only decrease/halt the speed of progression of neuropathy.  Patient was again educated that physical therapy has limited effects on improving unpleasant sensations or numbness from peripheral neuropathy, but can have some effect on improving strength, balance, and function. Physical therapy can also be effective in improving low back discomfort and strength. Patient would benefit from a bout of physical therapy to help address functional, balance, and lumbar stability/strength deficits he complains of and demonstrates during session..Patient presents with significant pain, paresthesia, muscle performance (strength/power/endurance), ataxia, coordination, joint stiffness, sensory, proprioceptive, knowledge, balance, gait, and activity tolerance  impairments that are limiting ability to complete usual activities including balance, walking, sleeping, difficulty with getting up and moving around, household and community  mobility, going out in the community, housework, gardening, going to the store without difficulty. Patient will benefit from skilled physical therapy intervention to address current body structure impairments and activity limitations to improve function and work towards goals set in current POC in order to return to prior level of function or maximal functional improvement.    OBJECTIVE IMPAIRMENTS: Abnormal gait, decreased activity tolerance, decreased balance, decreased coordination, decreased endurance, decreased knowledge of condition, decreased mobility, difficulty walking, decreased ROM, decreased strength, hypomobility, impaired perceived functional ability, impaired sensation, and pain.    ACTIVITY LIMITATIONS: carrying, lifting, sitting, standing, squatting, sleeping, stairs, transfers, dressing, and locomotion level   PARTICIPATION LIMITATIONS: meal prep, cleaning, interpersonal relationship, shopping, community activity, yard work, and   difficulty with balance, walking, sleeping, difficulty with getting up and moving around, household and community mobility, going out in the community, housework, gardening, going to the store   PERSONAL FACTORS: Behavior pattern, Past/current experiences, Time since onset of injury/illness/exacerbation, and 3+ comorbidities:   CVA on 06/20/2022 (MRI showed punctate acute/subacute nonhemorrhagic infarct in the posterior left lower midbrain near the 4th cranial nerve nucleus with remote lacunar infarcts of the right thalamus and right cerebellum),  4mm PCOM aneurysm (found incidentally on CTA 06/20/2022, currently undergoing conservative monitoring), concern over dilantin related neuropathy (starting to wean off), systolic murmur, benign prostatic hyperplasia with urinary obstruction (followed by urology), chronic R ankle pain (likely due to post traumatic arthritis), seizure disorder (controlled over many years with dilantin), low back pain, ACDR for myelopathy at  C3-C4, 05/2018), cataract surgeries (bilateral), right Carpal tunnel release (06/2019), symptoms of peripheral neuropathy at all limbs (fine motor skills in B UE affected), perfuse degenerative changes on lumbar and cervical spine imaging (see chart for details), chronic kidney disease (concern over it being related to dilantin, followed by nephrology). are also affecting patient's functional outcome.    REHAB POTENTIAL: Fair due to likelihood of neuropathy being the root cause of his chief complaint.   CLINICAL DECISION MAKING: Evolving/moderate complexity   EVALUATION COMPLEXITY: Moderate     GOALS: Goals reviewed with patient? No   SHORT TERM GOALS: Target date: 04/01/2023   Patient will be independent  with initial home exercise program for self-management of symptoms. Baseline: Initial HEP to be provided at visit 2 as appropriate (03/18/23); Goal status: In-progress     LONG TERM GOALS: Target date: 04/29/2023    Patient will be independent with a long-term home exercise program for self-management of symptoms.  Baseline: Initial HEP to be provided at visit 2 as appropriate (03/18/23); Goal status: In-progress   2.  Patient will demonstrate improved FOTO by equal or greater than 10 points by visit #12 to demonstrate improvement in overall condition and self-reported functional ability.  Baseline: emailed to patient (03/18/23); Goal status: In-progress   3.  Patient will score equal or greater than 25/30 on Functional Gait Assessment to demonstrate low fall risk.  Baseline: to be tested at visit 2 as appropriate (03/18/23); Goal status: In-progress   4.  Patient will ambulate equal or greater than 1370 feet on 6 Minute Walk Test with LRAD to equal norm for community dwelling older adults between ages 47-89 years and improve his community mobility and ability to shop at big box stores.  Baseline: to be tested visit 2 as appropriate (03/18/23); 1170 feet (03/22/2023);  Goal status:  In-progress   5.  Patient will score equal or greater than Level 2 on Sah[place]ann Core Stability Test to demonstrate improved core strength and motor control to decrease feeling of weakness in the back and improve ability to ambulate in the community.  Baseline: To be tested at future date as appropriate (03/18/23); Level 1 (03/22/2023);  Goal status: In-progress       PLAN:   PT FREQUENCY: 1-2x/week   PT DURATION: 6 weeks   PLANNED INTERVENTIONS: Therapeutic exercises, Therapeutic activity, Neuromuscular re-education, Balance training, Gait training, Patient/Family education, Self Care, Joint mobilization, Stair training, DME instructions, Dry Needling, Electrical stimulation, Spinal mobilization, Cryotherapy, Moist heat, Manual therapy, and Re-evaluation.   PLAN FOR NEXT SESSION: Progressive core/LE/functional strengthening and balance exercises; neurodynamic exercises and manual therapy as needed.    Cira Rue, PT, DPT 04/06/2023, 4:46 PM  [facility] [address] [place], [place] [address] P: [phone] I F: [phone]

## 2023-04-08 ENCOUNTER — Encounter: Payer: Self-pay | Admitting: Physical Therapy

## 2023-04-08 ENCOUNTER — Ambulatory Visit: Payer: Medicare Other | Admitting: Physical Therapy

## 2023-04-08 DIAGNOSIS — R2689 Other abnormalities of gait and mobility: Secondary | ICD-10-CM

## 2023-04-08 DIAGNOSIS — M5459 Other low back pain: Secondary | ICD-10-CM

## 2023-04-08 DIAGNOSIS — R208 Other disturbances of skin sensation: Secondary | ICD-10-CM

## 2023-04-08 DIAGNOSIS — M6281 Muscle weakness (generalized): Secondary | ICD-10-CM

## 2023-04-08 NOTE — Therapy (Signed)
OUTPATIENT PHYSICAL THERAPY TREATMENT NOTE   Patient Name: Mark Holmes MRN: [id number] DOB:September 22, 1935, 88 y.o.SUMEET GETERte: 04/08/2023  PCP: Eustaquio Boyden, MD  REFERRING PROVIDER: Eustaquio Boyden, MD; also recommended by Cristopher Peru, MD (Neurology)   END OF SESSION:   PT End of Session - 04/08/23 1710     Visit Number 6    Number of Visits 12    Date for PT Re-Evaluation 04/29/23    Authorization Type [employer] MEDICARE reporting period from 03/18/2023    Progress Note Due on Visit 10    PT Start Time 1545    PT Stop Time 1625    PT Time Calculation (min) 40 min    Activity Tolerance Patient tolerated treatment well;Patient limited by fatigue;No increased pain    Behavior During Therapy WFL for tasks assessed/performed              Past Medical History:  Diagnosis Date   Benign prostatic hypertrophy with elevated PSA    per Dr. Achilles Dunk   History of CT scan of brain 1992   normal   Hyperglycemia 01/2006   102   Hyperlipemia    Hypertension    Seizure disorder    Past Surgical History:  Procedure Laterality Date   ANTERIOR CERVICAL DECOMP/DISCECTOMY FUSION  05/2018   for myelopathy -C3/4 ([facility])   CARPAL TUNNEL RELEASE Right 06/2019   ([facility])   CATARACT EXTRACTION W/ INTRAOCULAR LENS IMPLANT Right 2012   [facility]   CATARACT EXTRACTION W/PHACO Left 01/14/2022   Procedure: CATARACT EXTRACTION PHACO AND INTRAOCULAR LENS PLACEMENT (IOC) LEFT;  Surgeon: Lockie Mola, MD;  Location: [facility];  Service: Ophthalmology;  Laterality: Left;  7.55 01:03.6   History of EEG  1985   normal   hospitalization  1981   observation for seizuare   TONSILLECTOMY     TRANSURETHRAL RESECTION OF PROSTATE  10/2005   [facility]   TRANSURETHRAL RESECTION OF PROSTATE  08/2017   (rpt) [facility]   Patient Active Problem List   Diagnosis Date Noted   Spinal stenosis of lumbar region with neurogenic claudication 03/04/2023    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   Bilateral hearing loss 06/30/2022   Brain aneurysm 06/30/2022   History of ischemic stroke 06/20/2022   Nephrotic range proteinuria 06/04/2022   Hypoproteinemia 06/01/2022   Atypical chest pain 07/10/2020   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   Unsteadiness on feet 11/08/2019   History of spinal surgery 01/17/2019   Neck pain 01/17/2019   Weakness of hand 01/17/2019   BPH (benign prostatic hyperplasia) 01/17/2019   Systolic murmur 07/04/2018   Right carpal tunnel syndrome 02/02/2018   Ulnar neuropathy at elbow, left 02/02/2018   Peripheral neuropathy 01/26/2018   Bilateral buttock pain 06/29/2016   Failed vision screen 06/29/2016   Advanced care planning/counseling discussion 06/21/2015   Medicare annual wellness visit, subsequent 06/18/2014   Health maintenance examination 06/18/2014   Chronic ankle pain 06/18/2014   Incomplete emptying of bladder 06/07/2013   Vitamin D deficiency 05/20/2013   THYROID NODULE 05/17/2007   Mixed hyperlipidemia 05/17/2007   Essential tremor 05/17/2007   Essential hypertension 05/17/2007   Cervical spondylosis with myelopathy and radiculopathy 05/17/2007   Seizure disorder 05/17/2007    REFERRING DIAG: unsteadiness on feet, cervical spondylosis with myelopathy and radiculopathy, spinal stenosis of lumbar region with neurogenic claudication   THERAPY DIAG:  Other low back pain  Other disturbances of skin sensation  Other abnormalities of gait and mobility  Muscle weakness (  generalized)  Rationale for Evaluation and Treatment Rehabilitation  PERTINENT HISTORY: Patient is a 88 y.o. male who presents to outpatient physical therapy with a referral for medical diagnosis unsteadiness on feet, cervical spondylosis with myelopathy and radiculopathy, spinal stenosis of lumbar region with neurogenic claudication. This patient's chief complaints consist of burning and numbness in B lower legs as well as feeling of  weakness in low back and lower legs leading to the following functional deficits: difficulty with balance, walking, sleeping, difficulty with getting up and moving around, household and community mobility, going out in the community, housework, gardening, going to the store.. 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  Patient denies hx of cancer, lung problems, heart problems, diabetes, unexplained weight loss, unexplained changes in bowel or bladder problems, and osteoporosis.   PRECAUTIONS: fall  SUBJECTIVE:                                                                                                                                                                                      SUBJECTIVE STATEMENT:  Patient arrives with [facility] and states he wants a total body transplant because he wants to be strong enough to do the turk[place] get up   PAIN:  NPRS 0/10   OBJECTIVE  TODAY'S TREATMENT:    Therapeutic  exercise: to centralize symptoms and improve ROM, strength, muscular endurance, and activity tolerance required for successful completion of functional activities.   - Part practice of [place] get up:  - step 2 (prop to lift hips), 3x10 each side - step 1 (supine to propped on hand) 2x10 each side holding - step 4 (half kneel to rest on one hand), 2x10 each side.  - split squat with 4 inch yoga block under knee, U UE support at TM bar, ipsilateral UE overhead, no weight. 2x10  - Education on HEP  including handout   Pt required multimodal cuing for proper technique and to facilitate improved neuromuscular control, strength, range of motion, and functional ability resulting in improved performance and form.     PATIENT EDUCATION:  Education details: Education on diagnosis, prognosis, POC, anatomy and physiology of current condition.  03/22/2023: Educated in PT request to not talk about PT's religious beliefs and not touching PT unnecessarily per PT preference in a professional setting. Patient states he understands and agrees.  Reviewed cancelation/no-show policy with patient and confirmed patient has correct phone number for clinic; patient verbalized understanding (03/24/23).  Person educated: Patient Education method: Explanation Education comprehension: verbalized understanding and needs further education   HOME EXERCISE PROGRAM: Access Code: [email or web address] URL: [email or web address] Date: 03/30/2023 Prepared by: Norton Blizzard  Exercises - The Hundred 3 Intermediate - Table Top  - 1 x daily - 10 reps - 30 seconds hold - Bird Dog with Resistance  - 1 x daily - 2-3 sets - 10 reps - Child's Pose Stretch  - 1 x daily - 3 reps - 20 seconds hold - Bear Walk  - 1 x daily - 3 sets - 5 reps  HOME EXERCISE PROGRAM [[email or web address]] View at "[email or web address]" using code: [email or web address] Modified [place] -  Repeat 10 Repetitions, Complete 2 Sets, Perform 3 Times a Week  HOME EXERCISE PROGRAM  [[id number]] View at "[email or web address]" using code: [email or web address]  [place] Get Up part 2 -  Repeat 10 Repetitions, Complete 2 Sets, Perform 3 Times a Week  [place] Get Up part 1 -  Repeat 10 Repetitions, Complete 2 Sets, Perform 3 Times a Week  [place] Get Up part 4 -  Repeat 10 Repetitions, Complete 2 Sets, Perform 3 Times a Week  [place] Get Up Modified part 5 -  Repeat 10 Repetitions, Complete 2 Sets, Perform 3 Times a Week   ASSESSMENT:   CLINICAL IMPRESSION: Patient arrives feeling motivated to get stronger and have more balance to be able to do the [place] Get Up. Today's session continued to focus on parts of the task to improve patient's core, UE, and functional strength, coordination, balance, and muscle endurance to be able to perform multi-step functional tasks without losing his balance. Patient was appropriately fatigued by end of session and found the exercises very challenging and satisfying. HEP updated to include improvements in his ability. He continues to require guarding and guidance from PT to perform exercise correctly and safely. Patient would benefit from continued management of limiting condition by skilled physical therapist to address remaining impairments and functional limitations to work towards stated goals and return to PLOF or maximal functional independence.   From Initial PT Evaluation 03/18/2023:  Patient is a 88 y.o. male referred to outpatient physical therapy with a medical diagnosis of unsteadiness on feet, cervical spondylosis with myelopathy and radiculopathy, spinal stenosis of lumbar region with neurogenic claudication who presents with signs and symptoms consistent with severe sensory B LE neuropathy, with ataxia and imbalance related to multifactorial contributors such as previously identified and surgically addressed cervical myelopathy, evidence of prior lacunar infarcts in cerebellum, severe chronic R ankle osteoarthritis (as previously established).  Patient does describe pattern of difficulty with mobility consistent with aspects of spinal stenosis with neurogenic claudication but he's excellent lumbar extension and report of improvement after repeated extensions decreases the likelihood of this condition in light of the other contributing factors. He does have positive SLR bilaterally, which is easily  negative in spinal stenosis with neurogenic claudication due to the position of the  spine during testing, but can be positive in other types of sciatica. Of note, patient reported the symptoms the SLR reproduced were not consistent with his concordant sign. The most likely strongest contributing factor to patient's chief complaint of feeling of numbness and weakness with prolonged weight bearing is severe sensory neuropathy. His neurologist is working with him to wean from Dilantin that has been identified by his medical team as likely contributor to his neuropathy, but it is unclear to this PT if he has potential for improvement once off of it or if it will only decrease/halt the speed of progression of neuropathy.  Patient was again educated that physical therapy has limited effects on improving unpleasant sensations or numbness from peripheral neuropathy, but can have some effect on improving strength, balance, and function. Physical therapy can also be effective in improving low back discomfort and strength. Patient would benefit from a bout of physical therapy to help address functional, balance, and lumbar stability/strength deficits he complains of and demonstrates during session..Patient presents with significant pain, paresthesia, muscle performance (strength/power/endurance), ataxia, coordination, joint stiffness, sensory, proprioceptive, knowledge, balance, gait, and activity tolerance  impairments that are limiting ability to complete usual activities including balance, walking, sleeping, difficulty with getting up and moving around, household and  community mobility, going out in the community, housework, gardening, going to the store without difficulty. Patient will benefit from skilled physical therapy intervention to address current body structure impairments and activity limitations to improve function and work towards goals set in current POC in order to return to prior level of function or maximal functional improvement.    OBJECTIVE IMPAIRMENTS: Abnormal gait, decreased activity tolerance, decreased balance, decreased coordination, decreased endurance, decreased knowledge of condition, decreased mobility, difficulty walking, decreased ROM, decreased strength, hypomobility, impaired perceived functional ability, impaired sensation, and pain.    ACTIVITY LIMITATIONS: carrying, lifting, sitting, standing, squatting, sleeping, stairs, transfers, dressing, and locomotion level   PARTICIPATION LIMITATIONS: meal prep, cleaning, interpersonal relationship, shopping, community activity, yard work, and   difficulty with balance, walking, sleeping, difficulty with getting up and moving around, household and community mobility, going out in the community, housework, gardening, going to the store   PERSONAL FACTORS: Behavior pattern, Past/current experiences, Time since onset of injury/illness/exacerbation, and 3+ comorbidities:   CVA on 06/20/2022 (MRI showed punctate acute/subacute nonhemorrhagic infarct in the posterior left lower midbrain near the 4th cranial nerve nucleus with remote lacunar infarcts of the right thalamus and right cerebellum),  4mm PCOM aneurysm (found incidentally on CTA 06/20/2022, currently undergoing conservative monitoring), concern over dilantin related neuropathy (starting to wean off), systolic murmur, benign prostatic hyperplasia with urinary obstruction (followed by urology), chronic R ankle pain (likely due to post traumatic arthritis), seizure disorder (controlled over many years with dilantin), low back pain, ACDR for  myelopathy at C3-C4, 05/2018), cataract surgeries (bilateral), right Carpal tunnel release (06/2019), symptoms of peripheral neuropathy at all limbs (fine motor skills in B UE affected), perfuse degenerative changes on lumbar and cervical spine imaging (see chart for details), chronic kidney disease (concern over it being related to dilantin, followed by nephrology). are also affecting patient's functional outcome.    REHAB POTENTIAL: Fair due to likelihood of neuropathy being the root cause of his chief complaint.   CLINICAL DECISION MAKING: Evolving/moderate complexity   EVALUATION COMPLEXITY: Moderate     GOALS: Goals reviewed with patient? No   SHORT TERM GOALS: Target date: 04/01/2023   Patient will be independent with initial home exercise program for self-management of  symptoms. Baseline: Initial HEP to be provided at visit 2 as appropriate (03/18/23); Goal status: In-progress     LONG TERM GOALS: Target date: 04/29/2023    Patient will be independent with a long-term home exercise program for self-management of symptoms.  Baseline: Initial HEP to be provided at visit 2 as appropriate (03/18/23); Goal status: In-progress   2.  Patient will demonstrate improved FOTO by equal or greater than 10 points by visit #12 to demonstrate improvement in overall condition and self-reported functional ability.  Baseline: emailed to patient (03/18/23); Goal status: In-progress   3.  Patient will score equal or greater than 25/30 on Functional Gait Assessment to demonstrate low fall risk.  Baseline: to be tested at visit 2 as appropriate (03/18/23); Goal status: In-progress   4.  Patient will ambulate equal or greater than 1370 feet on 6 Minute Walk Test with LRAD to equal norm for community dwelling older adults between ages 34-89 years and improve his community mobility and ability to shop at big box stores.  Baseline: to be tested visit 2 as appropriate (03/18/23); 1170 feet (03/22/2023);  Goal  status: In-progress   5.  Patient will score equal or greater than Level 2 on [place]ann Core Stability Test to demonstrate improved core strength and motor control to decrease feeling of weakness in the back and improve ability to ambulate in the community.  Baseline: To be tested at future date as appropriate (03/18/23); Level 1 (03/22/2023);  Goal status: In-progress       PLAN:   PT FREQUENCY: 1-2x/week   PT DURATION: 6 weeks   PLANNED INTERVENTIONS: Therapeutic exercises, Therapeutic activity, Neuromuscular re-education, Balance training, Gait training, Patient/Family education, Self Care, Joint mobilization, Stair training, DME instructions, Dry Needling, Electrical stimulation, Spinal mobilization, Cryotherapy, Moist heat, Manual therapy, and Re-evaluation.   PLAN FOR NEXT SESSION: Progressive core/LE/functional strengthening and balance exercises; neurodynamic exercises and manual therapy as needed.    Cira Rue, PT, DPT 04/08/2023, 5:14 PM  [facility] [address] [place], [place] [address] P: [phone] I F: [phone]

## 2023-04-14 ENCOUNTER — Ambulatory Visit: Payer: Medicare Other | Admitting: Physical Therapy

## 2023-04-14 ENCOUNTER — Encounter: Payer: Self-pay | Admitting: Physical Therapy

## 2023-04-14 DIAGNOSIS — R2689 Other abnormalities of gait and mobility: Secondary | ICD-10-CM

## 2023-04-14 DIAGNOSIS — M6281 Muscle weakness (generalized): Secondary | ICD-10-CM

## 2023-04-14 DIAGNOSIS — M5459 Other low back pain: Secondary | ICD-10-CM

## 2023-04-14 DIAGNOSIS — R208 Other disturbances of skin sensation: Secondary | ICD-10-CM

## 2023-04-14 NOTE — Therapy (Signed)
OUTPATIENT PHYSICAL THERAPY TREATMENT NOTE   Patient Name: Mark Holmes MRN: [id number] DOB:14-Sep-1935, 88 y.o., male Today's Date: 04/14/2023  PCP: Mark Boyden, MD  REFERRING PROVIDER: Eustaquio Boyden, MD; also recommended by Mark Peru, MD (Neurology)   END OF SESSION:   PT End of Session - 04/14/23 1607     Visit Number 7    Number of Visits 12    Date for PT Re-Evaluation 04/29/23    Authorization Type [employer] MEDICARE reporting period from 03/18/2023    Progress Note Due on Visit 10    PT Start Time 1517    PT Stop Time 1603    PT Time Calculation (min) 46 min    Activity Tolerance Patient tolerated treatment well;Patient limited by fatigue;No increased pain    Behavior During Therapy WFL for tasks assessed/performed               Past Medical History:  Diagnosis Date   Benign prostatic hypertrophy with elevated PSA    per Dr. Achilles Holmes   History of CT scan of brain 1992   normal   Hyperglycemia 01/2006   102   Hyperlipemia    Hypertension    Seizure disorder    Past Surgical History:  Procedure Laterality Date   ANTERIOR CERVICAL DECOMP/DISCECTOMY FUSION  05/2018   for myelopathy -C3/4 (Mark Holmes @ [facility])   CARPAL TUNNEL RELEASE Right 06/2019   (Mark Holmes)   CATARACT EXTRACTION W/ INTRAOCULAR LENS IMPLANT Right 2012   [facility]   CATARACT EXTRACTION W/PHACO Left 01/14/2022   Procedure: CATARACT EXTRACTION PHACO AND INTRAOCULAR LENS PLACEMENT (IOC) LEFT;  Surgeon: Mark Mola, MD;  Location: [facility];  Service: Ophthalmology;  Laterality: Left;  7.55 01:03.6   History of EEG  1985   normal   hospitalization  1981   observation for seizuare   TONSILLECTOMY     TRANSURETHRAL RESECTION OF PROSTATE  10/2005   [facility]   TRANSURETHRAL RESECTION OF PROSTATE  08/2017   (rpt) [facility]   Patient Active Problem List   Diagnosis Date Noted   Spinal stenosis of lumbar region with neurogenic claudication 03/04/2023    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   Bilateral hearing loss 06/30/2022   Brain aneurysm 06/30/2022   History of ischemic stroke 06/20/2022   Nephrotic range proteinuria 06/04/2022   Hypoproteinemia 06/01/2022   Atypical chest pain 07/10/2020   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   Unsteadiness on feet 11/08/2019   History of spinal surgery 01/17/2019   Neck pain 01/17/2019   Weakness of hand 01/17/2019   BPH (benign prostatic hyperplasia) 01/17/2019   Systolic murmur 07/04/2018   Right carpal tunnel syndrome 02/02/2018   Ulnar neuropathy at elbow, left 02/02/2018   Peripheral neuropathy 01/26/2018   Bilateral buttock pain 06/29/2016   Failed vision screen 06/29/2016   Advanced care planning/counseling discussion 06/21/2015   Medicare annual wellness visit, subsequent 06/18/2014   Health maintenance examination 06/18/2014   Chronic ankle pain 06/18/2014   Incomplete emptying of bladder 06/07/2013   Vitamin D deficiency 05/20/2013   THYROID NODULE 05/17/2007   Mixed hyperlipidemia 05/17/2007   Essential tremor 05/17/2007   Essential hypertension 05/17/2007   Cervical spondylosis with myelopathy and radiculopathy 05/17/2007   Seizure disorder 05/17/2007    REFERRING DIAG: unsteadiness on feet, cervical spondylosis with myelopathy and radiculopathy, spinal stenosis of lumbar region with neurogenic claudication   THERAPY DIAG:  Other low back pain  Other disturbances of skin sensation  Other abnormalities of gait and mobility  Muscle  weakness (generalized)  Rationale for Evaluation and Treatment Rehabilitation  PERTINENT HISTORY: Patient is a 88 y.o. male who presents to outpatient physical therapy with a referral for medical diagnosis unsteadiness on feet, cervical spondylosis with myelopathy and radiculopathy, spinal stenosis of lumbar region with neurogenic claudication. This patient's chief complaints consist of burning and numbness in B lower legs as well as feeling of  weakness in low back and lower legs leading to the following functional deficits: difficulty with balance, walking, sleeping, difficulty with getting up and moving around, household and community mobility, going out in the community, housework, gardening, going to the store.. 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  Patient denies hx of cancer, lung problems, heart problems, diabetes, unexplained weight loss, unexplained changes in bowel or bladder problems, and osteoporosis.   PRECAUTIONS: fall  SUBJECTIVE:                                                                                                                                                                                      SUBJECTIVE STATEMENT:  Patient arrives with Mark Holmes and states he has been practicing the turkish get up and is mostly practicing trying to lift his hips. He and his wife took his dog to the vet and the dog was tearing around the car and  he was trying to hold her still and did something to his right shoulder. It was very sore the day afterwards and it comes and goes. The shoulder pain is not bothering him when practicing the turkish get up. He continues to have a lot of numbness in his feet. He has not noticed any effect as he starts to taper off of his anti-seizure medicine. He states he has noticed it is easier to get down the stairs at home now.    PAIN:  NPRS 0/10  OBJECTIVE  TODAY'S TREATMENT:    Therapeutic exercise: to centralize symptoms and improve ROM, strength, muscular endurance, and activity tolerance required for successful completion of functional activities.   - Part practice of turkish get up:  - step 2 (prop to lift hips), 3x7-10 each side (occasionally using elbow). - step 1 (supine to propped on hand) 2x10 each side holding - step 4 (half kneel to rest on one hand), 3x10 each side.  - attempted step 4 to step 3 but too difficult. - reverse lunge with 4 inch yoga block under knee, L UE support at TM bar. 2x10 each side.   - attempted full mountain climbers, but discontinued due to R ankle pain.  - one LE bird dog pulling knee to chest, 1x10 each side.  - bird dog knee to elbow touches, 2x10 each side.   Pt required multimodal cuing for proper technique and to facilitate improved neuromuscular control, strength, range of motion, and functional ability resulting in improved performance and form.     PATIENT EDUCATION:  Education details: Education on diagnosis, prognosis, POC, anatomy and physiology of current condition.  03/22/2023: Educated in PT request to not talk about PT's religious beliefs and not touching PT unnecessarily per PT preference in a professional setting. Patient states he understands and agrees.  Reviewed cancelation/no-show policy with patient and confirmed patient has correct phone number for clinic; patient verbalized understanding (03/24/23).  Person educated:  Patient Education method: Explanation Education comprehension: verbalized understanding and needs further education   HOME EXERCISE PROGRAM: Access Code: [email or web address] URL: [email or web address] Date: 04/14/2023 Prepared by: Norton Blizzard  Exercises - The Hundred 3 Intermediate - Table Top  - 1 x daily - 10 reps - 30 seconds hold - Bird Dog with Resistance  - 1 x daily - 2-3 sets - 10 reps - Child's Pose Stretch  - 1 x daily - 3 reps - 20 seconds hold - Bear Walk  - 1 x daily - 3 sets - 5 reps - Bird Dog with Knee Taps  - 3 x weekly - 3 sets - 10 reps  HOME EXERCISE PROGRAM [[email or web address]] View at "[email or web address]" using code: [email or web address] Modified [place] -  Repeat 10 Repetitions, Complete 2 Sets, Perform 3 Times a Week  HOME EXERCISE PROGRAM [[email or web address]] View at "[email or web address]" using code: [email or web address]  [place] Get Up part 2 -  Repeat 10 Repetitions, Complete 2 Sets, Perform 3 Times a Week  [place] Get Up part 1 -  Repeat 10 Repetitions, Complete 2 Sets, Perform 3 Times a Week  [place] Get Up part 4 -  Repeat 10 Repetitions, Complete 2 Sets, Perform 3 Times a Week  [place] Get Up Modified part 5 -  Repeat 10 Repetitions, Complete 2 Sets, Perform 3 Times a Week   ASSESSMENT:   CLINICAL IMPRESSION: Patient arrives feeling well but with report of right shoulder pain after his dog pulled him a lot while going to the vet. Continued working on total body conditioning using turkish get up as a task to challenge strength, activity tolerance, coordination and balance. He was able to tolerate pressure through right UE except when pulling up to assist himself with reverse lunge. Therefore, exercise was modified not to use R UE for reverse lunge. He become slightly lightheaded at one point after not taking a break between right and left reverse lunge but recovered with rest and continued with exercises with no problem. Patient would benefit from continued management of limiting condition by  skilled physical therapist to address  remaining impairments and functional limitations to work towards stated goals and return to PLOF or maximal functional independence.    From Initial PT Evaluation 03/18/2023:  Patient is a 88 y.o. male referred to outpatient physical therapy with a medical diagnosis of unsteadiness on feet, cervical spondylosis with myelopathy and radiculopathy, spinal stenosis of lumbar region with neurogenic claudication who presents with signs and symptoms consistent with severe sensory B LE neuropathy, with ataxia and imbalance related to multifactorial contributors such as previously identified and surgically addressed cervical myelopathy, evidence of prior lacunar infarcts in cerebellum, severe chronic R ankle osteoarthritis (as previously established). Patient does describe pattern of difficulty with mobility consistent with aspects of spinal stenosis with neurogenic claudication but he's excellent lumbar extension and report of improvement after repeated extensions decreases the likelihood of this condition in light of the other contributing factors. He does have positive SLR bilaterally, which is easily  negative in spinal stenosis with neurogenic claudication due to the position of the spine during testing, but can be positive in other types of sciatica. Of note, patient reported the symptoms the SLR reproduced were not consistent with his concordant sign. The most likely strongest contributing factor to patient's chief complaint of feeling of numbness and weakness with prolonged weight bearing is severe sensory neuropathy. His neurologist is working with him to wean from Dilantin that has been identified by his medical team as likely contributor to his neuropathy, but it is unclear to this PT if he has potential for improvement once off of it or if it will only decrease/halt the speed of progression of neuropathy.  Patient was again educated that physical therapy has limited  effects on improving unpleasant sensations or numbness from peripheral neuropathy, but can have some effect on improving strength, balance, and function. Physical therapy can also be effective in improving low back discomfort and strength. Patient would benefit from a bout of physical therapy to help address functional, balance, and lumbar stability/strength deficits he complains of and demonstrates during session..Patient presents with significant pain, paresthesia, muscle performance (strength/power/endurance), ataxia, coordination, joint stiffness, sensory, proprioceptive, knowledge, balance, gait, and activity tolerance  impairments that are limiting ability to complete usual activities including balance, walking, sleeping, difficulty with getting up and moving around, household and community mobility, going out in the community, housework, gardening, going to the store without difficulty. Patient will benefit from skilled physical therapy intervention to address current body structure impairments and activity limitations to improve function and work towards goals set in current POC in order to return to prior level of function or maximal functional improvement.    OBJECTIVE IMPAIRMENTS: Abnormal gait, decreased activity tolerance, decreased balance, decreased coordination, decreased endurance, decreased knowledge of condition, decreased mobility, difficulty walking, decreased ROM, decreased strength, hypomobility, impaired perceived functional ability, impaired sensation, and pain.    ACTIVITY LIMITATIONS: carrying, lifting, sitting, standing, squatting, sleeping, stairs, transfers, dressing, and locomotion level   PARTICIPATION LIMITATIONS: meal prep, cleaning, interpersonal relationship, shopping, community activity, yard work, and   difficulty with balance, walking, sleeping, difficulty with getting up and moving around, household and community mobility, going out in the community, housework,  gardening, going to the store   PERSONAL FACTORS: Behavior pattern, Past/current experiences, Time since onset of injury/illness/exacerbation, and 3+ comorbidities:   CVA on 06/20/2022 (MRI showed punctate acute/subacute nonhemorrhagic infarct in the posterior left lower midbrain near the 4th cranial nerve nucleus with remote lacunar infarcts of the right thalamus and right cerebellum),  4mm PCOM aneurysm (found incidentally on  CTA 06/20/2022, currently undergoing conservative monitoring), concern over dilantin related neuropathy (starting to wean off), systolic murmur, benign prostatic hyperplasia with urinary obstruction (followed by urology), chronic R ankle pain (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 are also affecting patient's functional outcome.    REHAB POTENTIAL: Fair due to likelihood of neuropathy being the root cause of his chief complaint.   CLINICAL DECISION MAKING: Evolving/moderate complexity   EVALUATION COMPLEXITY: Moderate     GOALS: Goals reviewed with patient? No   SHORT TERM GOALS: Target date: 04/01/2023   Patient will be independent with initial home exercise program for self-management of symptoms. Baseline: Initial HEP to be provided at visit 2 as appropriate (03/18/23); Goal status: In-progress     LONG TERM GOALS: Target date: 04/29/2023    Patient will be independent with a long-term home exercise program for self-management of symptoms.  Baseline: Initial HEP to be provided at visit 2 as appropriate (03/18/23); Goal  status: In-progress   2.  Patient will demonstrate improved FOTO by equal or greater than 10 points by visit #12 to demonstrate improvement in overall condition and self-reported functional ability.  Baseline: emailed to patient (03/18/23); Goal status: In-progress   3.  Patient will score equal or greater than 25/30 on Functional Gait Assessment to demonstrate low fall risk.  Baseline: to be tested at visit 2 as appropriate (03/18/23); Goal status: In-progress   4.  Patient will ambulate equal or greater than 1370 feet on 6 Minute Walk Test with LRAD to equal norm for community dwelling older adults between ages 61-89 years and improve his community mobility and ability to shop at big box stores.  Baseline: to be tested visit 2 as appropriate (03/18/23); 1170 feet (03/22/2023);  Goal status: In-progress   5.  Patient will score equal or greater than Level 2 on [place]ann Core Stability Test to demonstrate improved core strength and motor control to decrease feeling of weakness in the back and improve ability to ambulate in the community.  Baseline: To be tested at future date as appropriate (03/18/23); Level 1 (03/22/2023);  Goal status: In-progress       PLAN:   PT FREQUENCY: 1-2x/week   PT DURATION: 6 weeks   PLANNED INTERVENTIONS: Therapeutic exercises, Therapeutic activity, Neuromuscular re-education, Balance training, Gait training, Patient/Family education, Self Care, Joint mobilization, Stair training, DME instructions, Dry Needling, Electrical stimulation, Spinal mobilization, Cryotherapy, Moist heat, Manual therapy, and Re-evaluation.   PLAN FOR NEXT SESSION: Progressive core/LE/functional strengthening and balance exercises; neurodynamic exercises and manual therapy as needed.    Cira Rue, PT, DPT 04/14/2023, 4:08 PM  [facility] [address] [place], [place] [address] P: [phone] I F: [phone]

## 2023-04-19 ENCOUNTER — Encounter: Payer: Medicare Other | Admitting: Physical Therapy

## 2023-04-19 ENCOUNTER — Ambulatory Visit: Payer: Medicare Other | Admitting: Physical Therapy

## 2023-04-19 ENCOUNTER — Encounter: Payer: Self-pay | Admitting: Physical Therapy

## 2023-04-19 DIAGNOSIS — M5459 Other low back pain: Secondary | ICD-10-CM | POA: Diagnosis not present

## 2023-04-19 DIAGNOSIS — R2689 Other abnormalities of gait and mobility: Secondary | ICD-10-CM

## 2023-04-19 DIAGNOSIS — M6281 Muscle weakness (generalized): Secondary | ICD-10-CM

## 2023-04-19 DIAGNOSIS — R208 Other disturbances of skin sensation: Secondary | ICD-10-CM

## 2023-04-19 NOTE — Therapy (Signed)
OUTPATIENT PHYSICAL THERAPY TREATMENT NOTE   Patient Name: Mark Holmes MRN: [id number] DOB:[birth year], 88 y.o., male Today's Date: 04/19/2023  PCP: Eustaquio Boyden, MD  REFERRING PROVIDER: Eustaquio Boyden, MD; also recommended by Cristopher Peru, MD (Neurology)   END OF SESSION:   PT End of Session - 04/19/23 1651     Visit Number 8    Number of Visits 12    Date for PT Re-Evaluation 04/29/23    Authorization Type [employer] MEDICARE reporting period from 03/18/2023    Progress Note Due on Visit 10    PT Start Time 1520    PT Stop Time 1600    PT Time Calculation (min) 40 min    Activity Tolerance Patient tolerated treatment well;Patient limited by fatigue;Patient limited by pain    Behavior During Therapy [facility] for tasks assessed/performed                Past Medical History:  Diagnosis Date   Benign prostatic hypertrophy with elevated PSA    per Dr. Achilles Dunk   History of CT scan of brain 1992   normal   Hyperglycemia 01/2006   102   Hyperlipemia    Hypertension    Seizure disorder [facility])    Past Surgical History:  Procedure Laterality Date   ANTERIOR CERVICAL DECOMP/DISCECTOMY FUSION  05/2018   for myelopathy -C3/4 ([facility])   CARPAL TUNNEL RELEASE Right 06/2019   ([facility])   CATARACT EXTRACTION W/ INTRAOCULAR LENS IMPLANT Right 2012   [facility]   CATARACT EXTRACTION W/PHACO Left 01/14/2022   Procedure: CATARACT EXTRACTION PHACO AND INTRAOCULAR LENS PLACEMENT (IOC) LEFT;  Surgeon: Lockie Mola, MD;  Location: [facility];  Service: Ophthalmology;  Laterality: Left;  7.55 01:03.6   History of EEG  1985   normal   hospitalization  1981   observation for seizuare   TONSILLECTOMY     TRANSURETHRAL RESECTION OF PROSTATE  10/2005   [facility]   TRANSURETHRAL RESECTION OF PROSTATE  08/2017   (rpt) [facility]   Patient Active Problem List   Diagnosis Date Noted   Spinal stenosis of lumbar region with neurogenic claudication  03/04/2023   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   Bilateral hearing loss 06/30/2022   Brain aneurysm 06/30/2022   History of ischemic stroke 06/20/2022   Nephrotic range proteinuria 06/04/2022   Hypoproteinemia (HCC) 06/01/2022   Atypical chest pain 07/10/2020   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   Unsteadiness on feet 11/08/2019   History of spinal surgery 01/17/2019   Neck pain 01/17/2019   Weakness of hand 01/17/2019   BPH (benign prostatic hyperplasia) 01/17/2019   Systolic murmur 07/04/2018   Right carpal tunnel syndrome 02/02/2018   Ulnar neuropathy at elbow, left 02/02/2018   Peripheral neuropathy 01/26/2018   Bilateral buttock pain 06/29/2016   Failed vision screen 06/29/2016   Advanced care planning/counseling discussion 06/21/2015   Medicare annual wellness visit, subsequent 06/18/2014   Health maintenance examination 06/18/2014   Chronic ankle pain 06/18/2014   Incomplete emptying of bladder 06/07/2013   Vitamin D deficiency 05/20/2013   THYROID NODULE 05/17/2007   Mixed hyperlipidemia 05/17/2007   Essential tremor 05/17/2007   Essential hypertension 05/17/2007   Cervical spondylosis with myelopathy and radiculopathy 05/17/2007   Seizure disorder (HCC) 05/17/2007    REFERRING DIAG: unsteadiness on feet, cervical spondylosis with myelopathy and radiculopathy, spinal stenosis of lumbar region with neurogenic claudication   THERAPY DIAG:  Other low back pain  Other disturbances of skin sensation  Other abnormalities of  gait and mobility  Muscle weakness (generalized)  Rationale for Evaluation and Treatment Rehabilitation  PERTINENT HISTORY: Patient is a 88 y.o. male who presents to outpatient physical therapy with a referral for medical diagnosis unsteadiness on feet, cervical spondylosis with myelopathy and radiculopathy, spinal stenosis of lumbar region with neurogenic claudication. This patient's chief complaints consist of burning and numbness in B lower  legs as well as feeling of weakness in low back and lower legs leading to the following functional deficits: difficulty with balance, walking, sleeping, difficulty with getting up and moving around, household and community mobility, going out in the community, housework, gardening, going to the store.. 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  Patient denies hx of cancer, lung problems, heart problems, diabetes, unexplained weight loss, unexplained changes in bowel or bladder problems, and osteoporosis.   PRECAUTIONS: fall  SUBJECTIVE:                                                                                                                                                                                      SUBJECTIVE STATEMENT:  Patient arrives with [facility] and states his left shoulder is a problem. He states he is getting to the point he can prop himself up and get his leg through, but it doesn't look very  clean. He has only been able to do it on one side because of his left shoulder. He states he has to concentrate on how he moves to be able to do the turkish get up. He would like a summary sheet of HEP to do through the week by the time he discharges. His left shoulder feels weak when he pushes and it hurts pull with it.    PAIN:  NPRS 0/10   OBJECTIVE  TODAY'S TREATMENT:    Therapeutic exercise: to centralize symptoms and improve ROM, strength,  muscular endurance, and activity tolerance required for successful completion of functional activities.  - seated glute thrust with back on 17 inch chair, 1x10 AROM with a2z pad behind back.  - seated glute thrust with back on 17 inch chair, 1x10 with 23#BB over hips with a2z pad behind back.  - seated glute thrust with back on 17 inch chair, 1x10 with 23#BB over hips with a2z pad behind back.  - seated glute thrust with back on 17 inch chair, 2x10 with 33#BB over hips with a2z pad under bar, partial ROM.  - attempted single leg glute thrust AROM, able to do 1x6 on left, but not on R due to ankle pain.  - seated knee extension at [facility] machine, seated position 2, B LE, 1x10 at 45#, single leg: R 3x10/9 at 25#, L 3x10 at 25#).   - bird dog knee to elbow touches, 1x10 each side.   - Part practice of turkish get up:  - step 1 (supine to propped on hand) 1x10 each side (cuing for shoulder away from ear.  - step 2 (prop to lift hips) on right side only, 1x6, 1x4 (pain holding R arm up).   Pt required multimodal cuing for proper technique and to facilitate improved neuromuscular control, strength, range of motion, and functional ability resulting in improved performance and form.     PATIENT EDUCATION:  Education details: Education on diagnosis, prognosis, POC, anatomy and physiology of current condition.  03/22/2023: Educated in PT request to not talk about PT's religious beliefs and not touching PT unnecessarily per PT preference in a professional  setting. Patient states he understands and agrees.  Reviewed cancelation/no-show policy with patient and confirmed patient has correct phone number for clinic; patient verbalized understanding (03/24/23).  Person educated: Patient Education method: Explanation Education comprehension: verbalized understanding and needs further education   HOME EXERCISE PROGRAM: Access Code: [email or web address] URL: [email or web address] Date: 04/14/2023 Prepared by: Norton Blizzard  Exercises - The Hundred 3 Intermediate - Table Top  - 1 x daily - 10 reps - 30 seconds hold - Bird Dog with Resistance  - 1 x daily - 2-3 sets - 10 reps - Child's Pose Stretch  - 1 x daily - 3 reps - 20 seconds hold - Bear Walk  - 1 x daily - 3 sets - 5 reps - Bird Dog with Knee Taps  - 3 x weekly - 3 sets - 10 reps  HOME EXERCISE PROGRAM [[email or web address]] View at "[email or web address]" using code: [email or web address] Modified [place] -  Repeat 10 Repetitions, Complete 2 Sets, Perform 3 Times a Week  HOME EXERCISE PROGRAM [[email or web address]] View at "[email or web address]" using code: [email or web address]  [place] Get Up part 2 -  Repeat 10 Repetitions, Complete 2 Sets, Perform 3 Times a Week  [place] Get Up part 1 -  Repeat 10 Repetitions, Complete 2 Sets, Perform 3 Times a Week  [place] Get Up part 4 -  Repeat 10 Repetitions, Complete 2 Sets, Perform 3 Times a Week  [place] Get Up Modified part 5 -  Repeat 10 Repetitions, Complete 2 Sets, Perform 3 Times a Week   ASSESSMENT:   CLINICAL IMPRESSION: Patient arrives with report of continued R shoulder pain that has restricted his HEP practice for turkish get up and limited practice today in clinic. He demonstrates improving strength and coordination overall but continues to lack sufficient glute and shoulder strength to complete the entire series of movement. Today's session focused on LE strengthening in clinic to improve his ability to complete functional  activities without falling. Exercises were modified to  avoid further irritating R shoulder. Patient would benefit from continued management of limiting condition by skilled physical therapist to address remaining impairments and functional limitations to work towards stated goals and return to PLOF or maximal functional independence.   From Initial PT Evaluation 03/18/2023:  Patient is a 88 y.o. male referred to outpatient physical therapy with a medical diagnosis of unsteadiness on feet, cervical spondylosis with myelopathy and radiculopathy, spinal stenosis of lumbar region with neurogenic claudication who presents with signs and symptoms consistent with severe sensory B LE neuropathy, with ataxia and imbalance related to multifactorial contributors such as previously identified and surgically addressed cervical myelopathy, evidence of prior lacunar infarcts in cerebellum, severe chronic R ankle osteoarthritis (as previously established). Patient does describe pattern of difficulty with mobility consistent with aspects of spinal stenosis with neurogenic claudication but he's excellent lumbar extension and report of improvement after repeated extensions decreases the likelihood of this condition in light of the other contributing factors. He does have positive SLR bilaterally, which is easily  negative in spinal stenosis with neurogenic claudication due to the position of the spine during testing, but can be positive in other types of sciatica. Of note, patient reported the symptoms the SLR reproduced were not consistent with his concordant sign. The most likely strongest contributing factor to patient's chief complaint of feeling of numbness and weakness with prolonged weight bearing is severe sensory neuropathy. His neurologist is working with him to wean from Dilantin that has been identified by his medical team as likely contributor to his neuropathy, but it is unclear to this PT if he has potential for improvement once off of it or if it will only decrease/halt  the speed of progression of neuropathy.  Patient was again educated that physical therapy has limited effects on improving unpleasant sensations or numbness from peripheral neuropathy, but can have some effect on improving strength, balance, and function. Physical therapy can also be effective in improving low back discomfort and strength. Patient would benefit from a bout of physical therapy to help address functional, balance, and lumbar stability/strength deficits he complains of and demonstrates during session..Patient presents with significant pain, paresthesia, muscle performance (strength/power/endurance), ataxia, coordination, joint stiffness, sensory, proprioceptive, knowledge, balance, gait, and activity tolerance  impairments that are limiting ability to complete usual activities including balance, walking, sleeping, difficulty with getting up and moving around, household and community mobility, going out in the community, housework, gardening, going to the store without difficulty. Patient will benefit from skilled physical therapy intervention to address current body structure impairments and activity limitations to improve function and work towards goals set in current POC in order to return to prior level of function or maximal functional improvement.    OBJECTIVE IMPAIRMENTS: Abnormal gait, decreased activity tolerance, decreased balance, decreased coordination, decreased endurance, decreased knowledge of condition, decreased mobility, difficulty walking, decreased ROM, decreased strength, hypomobility, impaired perceived functional ability, impaired sensation, and pain.    ACTIVITY LIMITATIONS: carrying, lifting, sitting, standing, squatting, sleeping, stairs, transfers, dressing, and locomotion level   PARTICIPATION LIMITATIONS: meal prep, cleaning, interpersonal relationship, shopping, community activity, yard work, and   difficulty with balance, walking, sleeping, difficulty with getting up  and moving around, household and community mobility, going out in the community, housework, gardening, going to the store   PERSONAL FACTORS: Behavior pattern, Past/current experiences, Time since onset of injury/illness/exacerbation, and 3+ comorbidities:   CVA on 06/20/2022 (MRI showed punctate acute/subacute nonhemorrhagic infarct in the posterior  left lower midbrain near the 4th cranial nerve nucleus with remote lacunar infarcts of the right thalamus and right cerebellum),  4mm PCOM aneurysm (found incidentally on CTA 06/20/2022, currently undergoing conservative monitoring), concern over dilantin related neuropathy (starting to wean off), systolic murmur, benign prostatic hyperplasia with urinary obstruction (followed by urology), chronic R ankle pain (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 are also affecting patient's functional outcome.    REHAB POTENTIAL: Fair due to likelihood of neuropathy being the root cause of his chief complaint.   CLINICAL DECISION MAKING: Evolving/moderate complexity   EVALUATION COMPLEXITY: Moderate     GOALS: Goals reviewed with patient? No   SHORT TERM GOALS: Target date: 04/01/2023   Patient will be independent with initial home exercise program for self-management of symptoms. Baseline: Initial HEP to be provided at visit 2 as appropriate (03/18/23); Goal status: In-progress     LONG TERM GOALS: Target date: 04/29/2023    Patient will be independent with a long-term home exercise program for self-management of  symptoms.  Baseline: Initial HEP to be provided at visit 2 as appropriate (03/18/23); Goal status: In-progress   2.  Patient will demonstrate improved FOTO by equal or greater than 10 points by visit #12 to demonstrate improvement in overall condition and self-reported functional ability.  Baseline: emailed to patient (03/18/23); Goal status: In-progress   3.  Patient will score equal or greater than 25/30 on Functional Gait Assessment to demonstrate low fall risk.  Baseline: to be tested at visit 2 as appropriate (03/18/23); Goal status: In-progress   4.  Patient will ambulate equal or greater than 1370 feet on 6 Minute Walk Test with LRAD to equal norm for community dwelling older adults between ages 26-89 years and improve his community mobility and ability to shop at big box stores.  Baseline: to be tested visit 2 as appropriate (03/18/23); 1170 feet (03/22/2023);  Goal status: In-progress   5.  Patient will score equal or greater than Level 2 on [place]ann Core Stability Test to demonstrate improved core strength and motor control to decrease feeling of weakness in the back and improve ability to ambulate in the community.  Baseline: To be tested at future date as appropriate (03/18/23); Level 1 (03/22/2023);  Goal status: In-progress       PLAN:   PT FREQUENCY: 1-2x/week   PT DURATION: 6 weeks   PLANNED INTERVENTIONS: Therapeutic exercises, Therapeutic activity, Neuromuscular re-education, Balance training, Gait training, Patient/Family education, Self Care, Joint mobilization, Stair training, DME instructions, Dry Needling, Electrical stimulation, Spinal mobilization, Cryotherapy, Moist heat, Manual therapy, and Re-evaluation.   PLAN FOR NEXT SESSION: Progressive core/LE/functional strengthening and balance exercises; neurodynamic exercises and manual therapy as needed.    Cira Rue, PT, DPT 04/19/2023, 4:52 PM  [facility] [address] [place], [place] [address] P: [phone] I F: [phone]

## 2023-04-21 ENCOUNTER — Ambulatory Visit: Payer: Medicare Other | Admitting: Physical Therapy

## 2023-04-22 ENCOUNTER — Ambulatory Visit: Payer: Medicare Other | Attending: Family Medicine | Admitting: Physical Therapy

## 2023-04-22 ENCOUNTER — Encounter: Payer: Self-pay | Admitting: Physical Therapy

## 2023-04-22 DIAGNOSIS — R208 Other disturbances of skin sensation: Secondary | ICD-10-CM | POA: Insufficient documentation

## 2023-04-22 DIAGNOSIS — M5459 Other low back pain: Secondary | ICD-10-CM | POA: Diagnosis present

## 2023-04-22 DIAGNOSIS — R2689 Other abnormalities of gait and mobility: Secondary | ICD-10-CM | POA: Diagnosis present

## 2023-04-22 DIAGNOSIS — M6281 Muscle weakness (generalized): Secondary | ICD-10-CM | POA: Insufficient documentation

## 2023-04-22 NOTE — Therapy (Signed)
OUTPATIENT PHYSICAL THERAPY TREATMENT NOTE   Patient Name: Mark Holmes MRN: [id number] DOB:Jul 13, 1935, 88 y.o., male Today's Date: 04/22/2023  PCP: Eustaquio Boyden, MD  REFERRING PROVIDER: Eustaquio Boyden, MD; also recommended by Cristopher Peru, MD (Neurology)   END OF SESSION:   PT End of Session - 04/22/23 1121     Visit Number 9    Number of Visits 12    Date for PT Re-Evaluation 04/29/23    Authorization Type [employer] MEDICARE reporting period from 03/18/2023    Progress Note Due on Visit 10    PT Start Time 1121    PT Stop Time 1200    PT Time Calculation (min) 39 min    Activity Tolerance Patient tolerated treatment well;Patient limited by fatigue;Patient limited by pain    Behavior During Therapy [facility] for tasks assessed/performed                 Past Medical History:  Diagnosis Date   Benign prostatic hypertrophy with elevated PSA    per Dr. Achilles Dunk   History of CT scan of brain 1992   normal   Hyperglycemia 01/2006   102   Hyperlipemia    Hypertension    Seizure disorder [facility])    Past Surgical History:  Procedure Laterality Date   ANTERIOR CERVICAL DECOMP/DISCECTOMY FUSION  05/2018   for myelopathy -C3/4 ([facility])   CARPAL TUNNEL RELEASE Right 06/2019   ([facility])   CATARACT EXTRACTION W/ INTRAOCULAR LENS IMPLANT Right 2012   [facility]   CATARACT EXTRACTION W/PHACO Left 01/14/2022   Procedure: CATARACT EXTRACTION PHACO AND INTRAOCULAR LENS PLACEMENT (IOC) LEFT;  Surgeon: Lockie Mola, MD;  Location: [facility];  Service: Ophthalmology;  Laterality: Left;  7.55 01:03.6   History of EEG  1985   normal   hospitalization  1981   observation for seizuare   TONSILLECTOMY     TRANSURETHRAL RESECTION OF PROSTATE  10/2005   [facility]   TRANSURETHRAL RESECTION OF PROSTATE  08/2017   (rpt) [facility]   Patient Active Problem List   Diagnosis Date Noted   Spinal stenosis of lumbar region with neurogenic claudication  03/04/2023   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   Bilateral hearing loss 06/30/2022   Brain aneurysm 06/30/2022   History of ischemic stroke 06/20/2022   Nephrotic range proteinuria 06/04/2022   Hypoproteinemia (HCC) 06/01/2022   Atypical chest pain 07/10/2020   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   Unsteadiness on feet 11/08/2019   History of spinal surgery 01/17/2019   Neck pain 01/17/2019   Weakness of hand 01/17/2019   BPH (benign prostatic hyperplasia) 01/17/2019   Systolic murmur 07/04/2018   Right carpal tunnel syndrome 02/02/2018   Ulnar neuropathy at elbow, left 02/02/2018   Peripheral neuropathy 01/26/2018   Bilateral buttock pain 06/29/2016   Failed vision screen 06/29/2016   Advanced care planning/counseling discussion 06/21/2015   Medicare annual wellness visit, subsequent 06/18/2014   Health maintenance examination 06/18/2014   Chronic ankle pain 06/18/2014   Incomplete emptying of bladder 06/07/2013   Vitamin D deficiency 05/20/2013   THYROID NODULE 05/17/2007   Mixed hyperlipidemia 05/17/2007   Essential tremor 05/17/2007   Essential hypertension 05/17/2007   Cervical spondylosis with myelopathy and radiculopathy 05/17/2007   Seizure disorder (HCC) 05/17/2007    REFERRING DIAG: unsteadiness on feet, cervical spondylosis with myelopathy and radiculopathy, spinal stenosis of lumbar region with neurogenic claudication   THERAPY DIAG:  Other low back pain  Other disturbances of skin sensation  Other abnormalities  of gait and mobility  Muscle weakness (generalized)  Rationale for Evaluation and Treatment Rehabilitation  PERTINENT HISTORY: Patient is a 88 y.o. male who presents to outpatient physical therapy with a referral for medical diagnosis unsteadiness on feet, cervical spondylosis with myelopathy and radiculopathy, spinal stenosis of lumbar region with neurogenic claudication. This patient's chief complaints consist of burning and numbness in B lower  legs as well as feeling of weakness in low back and lower legs leading to the following functional deficits: difficulty with balance, walking, sleeping, difficulty with getting up and moving around, household and community mobility, going out in the community, housework, gardening, going to the store.. 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  Patient denies hx of cancer, lung problems, heart problems, diabetes, unexplained weight loss, unexplained changes in bowel or bladder problems, and osteoporosis.   PRECAUTIONS: fall  SUBJECTIVE:                                                                                                                                                                                      SUBJECTIVE STATEMENT:  Patient arrives with [facility] and states his left shoulder is getting better.  He states he can tell his legs are stronger on the stairs at home.    PAIN:  NPRS 4/10 across low back.     OBJECTIVE  TODAY'S TREATMENT:    Therapeutic exercise: to centralize symptoms and improve ROM, strength, muscular endurance, and activity tolerance required for successful completion of functional activities.  - seated glute thrust with back on 17 inch chair covered with towel, 1x10 AROM.  - seated glute thrust with back on 17 inch chair covered with towel, 1x10 with 23#BB over hips.  - seated glute thrust with back on 17 inch chair, 3x10 with 33#BB over hips with folded towl under bar. (Cuing for weight  through heels). - single leg glute thrust AROM, able to do 1x5 on each side, partial ROM and very difficult due to lack of strength. - seated tricep push up to scapular depression from 18 inch chair with arms, 3x10 (first set sitting on yoga block in chair).  - seated knee extension at [facility] machine, seated position 2, B LE, 1x10 at 55#, single leg: R 3x7/10 at 25/20/20#, L 3x10 at 25#).   - bird dog knee to elbow touches, 3x10 each side. (Last two sets with RTB.   Pt required multimodal cuing for proper technique and to facilitate improved neuromuscular control, strength, range of motion, and functional ability resulting in improved performance and form.     PATIENT EDUCATION:  Education details: Education on diagnosis, prognosis, POC, anatomy and physiology of current condition.  03/22/2023: Educated in PT request to not talk about PT's religious beliefs and not touching PT unnecessarily per PT preference in a professional setting. Patient states he understands and agrees.  Reviewed cancelation/no-show policy with patient and confirmed patient has correct phone number for clinic; patient verbalized understanding (03/24/23).  Person educated: Patient Education method: Explanation Education comprehension: verbalized understanding and needs further education   HOME EXERCISE PROGRAM: Access Code: [email or web address] URL: [email or web address] Date: 04/14/2023 Prepared by: Norton Blizzard  Exercises - The Hundred 3 Intermediate - Table Top  - 1 x daily - 10 reps - 30 seconds hold - Bird Dog with Resistance  - 1 x daily - 2-3 sets - 10 reps - Child's Pose Stretch  - 1 x daily - 3 reps - 20 seconds hold - Bear Walk  - 1 x daily - 3 sets - 5 reps - Bird Dog with Knee Taps  - 3 x weekly - 3 sets - 10 reps  HOME EXERCISE PROGRAM [[email or web address]] View at "[email or web address]" using code: [email or web address] Modified [place] -  Repeat 10 Repetitions, Complete 2 Sets, Perform 3 Times a Week  HOME EXERCISE PROGRAM [[id number]] View at "[email or web address]" using code: [email or web address]  [place] Get Up part 2 -  Repeat 10 Repetitions, Complete 2 Sets, Perform 3 Times a Week  [place] Get Up part 1 -  Repeat 10 Repetitions, Complete 2 Sets, Perform 3 Times a Week  [place] Get Up part 4 -  Repeat 10 Repetitions, Complete 2 Sets, Perform 3 Times a Week  [place] Get Up Modified part 5 -  Repeat 10 Repetitions, Complete 2 Sets, Perform 3 Times a Week  HOME EXERCISE PROGRAM [[id number]] View at "[email or web address]" using code: [email or web address] DIPS IN CHAIR -  Repeat 3 Repetitions, Hold 1 Second(s), Complete 3 Sets, Perform 3 Times a Week   ASSESSMENT:   CLINICAL IMPRESSION: Patient arrives reporting improvement in R shoulder but still severe pain with certain movements (abduction in clinic). He was able to complete triceps push up with scapular depression at an armed chair with no pain in R shoulder at all. Continued with LE strengthening and motor control exercises. Patient continues to require PT assistance with exercises selection and cuing for form, intensity, and rest breaks for optimal rehab. Plan to complete 10th visit progress note next session. Patient would benefit from continued management of limiting condition by skilled physical therapist to address remaining impairments and functional limitations to work towards stated goals and return to PLOF or maximal functional independence.   From Initial PT  Evaluation 03/18/2023:  Patient is a 88 y.o. male referred to outpatient physical therapy with a medical diagnosis of unsteadiness on feet, cervical spondylosis with myelopathy and  radiculopathy, spinal stenosis of lumbar region with neurogenic claudication who presents with signs and symptoms consistent with severe sensory B LE neuropathy, with ataxia and imbalance related to multifactorial contributors such as previously identified and surgically addressed cervical myelopathy, evidence of prior lacunar infarcts in cerebellum, severe chronic R ankle osteoarthritis (as previously established). Patient does describe pattern of difficulty with mobility consistent with aspects of spinal stenosis with neurogenic claudication but he's excellent lumbar extension and report of improvement after repeated extensions decreases the likelihood of this condition in light of the other contributing factors. He does have positive SLR bilaterally, which is easily  negative in spinal stenosis with neurogenic claudication due to the position of the spine during testing, but can be positive in other types of sciatica. Of note, patient reported the symptoms the SLR reproduced were not consistent with his concordant sign. The most likely strongest contributing factor to patient's chief complaint of feeling of numbness and weakness with prolonged weight bearing is severe sensory neuropathy. His neurologist is working with him to wean from Dilantin that has been identified by his medical team as likely contributor to his neuropathy, but it is unclear to this PT if he has potential for improvement once off of it or if it will only decrease/halt the speed of progression of neuropathy.  Patient was again educated that physical therapy has limited effects on improving unpleasant sensations or numbness from peripheral neuropathy, but can have some effect on improving strength, balance, and function. Physical therapy can also be effective in  improving low back discomfort and strength. Patient would benefit from a bout of physical therapy to help address functional, balance, and lumbar stability/strength deficits he complains of and demonstrates during session..Patient presents with significant pain, paresthesia, muscle performance (strength/power/endurance), ataxia, coordination, joint stiffness, sensory, proprioceptive, knowledge, balance, gait, and activity tolerance  impairments that are limiting ability to complete usual activities including balance, walking, sleeping, difficulty with getting up and moving around, household and community mobility, going out in the community, housework, gardening, going to the store without difficulty. Patient will benefit from skilled physical therapy intervention to address current body structure impairments and activity limitations to improve function and work towards goals set in current POC in order to return to prior level of function or maximal functional improvement.    OBJECTIVE IMPAIRMENTS: Abnormal gait, decreased activity tolerance, decreased balance, decreased coordination, decreased endurance, decreased knowledge of condition, decreased mobility, difficulty walking, decreased ROM, decreased strength, hypomobility, impaired perceived functional ability, impaired sensation, and pain.    ACTIVITY LIMITATIONS: carrying, lifting, sitting, standing, squatting, sleeping, stairs, transfers, dressing, and locomotion level   PARTICIPATION LIMITATIONS: meal prep, cleaning, interpersonal relationship, shopping, community activity, yard work, and   difficulty with balance, walking, sleeping, difficulty with getting up and moving around, household and community mobility, going out in the community, housework, gardening, going to the store   PERSONAL FACTORS: Behavior pattern, Past/current experiences, Time since onset of injury/illness/exacerbation, and 3+ comorbidities:   CVA on 06/20/2022 (MRI showed punctate  acute/subacute nonhemorrhagic infarct in the posterior left lower midbrain near the 4th cranial nerve nucleus with remote lacunar infarcts of the right thalamus and right cerebellum),  4mm PCOM aneurysm (found incidentally on CTA 06/20/2022, currently undergoing conservative monitoring), concern over dilantin related neuropathy (starting to wean off), systolic murmur, benign prostatic hyperplasia with urinary obstruction (followed by urology), chronic R ankle pain (likely due to post traumatic arthritis), seizure disorder (controlled over many years with dilantin), low back pain, ACDR for myelopathy at  C3-C4, 05/2018), cataract surgeries (bilateral), right Carpal tunnel release (06/2019), symptoms of peripheral neuropathy at all limbs (fine motor skills in B UE affected), perfuse degenerative changes on lumbar and cervical spine imaging (see chart for details), chronic kidney disease (concern over it being related to dilantin, followed by nephrology). are also affecting patient's functional outcome.    REHAB POTENTIAL: Fair due to likelihood of neuropathy being the root cause of his chief complaint.   CLINICAL DECISION MAKING: Evolving/moderate complexity   EVALUATION COMPLEXITY: Moderate     GOALS: Goals reviewed with patient? No   SHORT TERM GOALS: Target date: 04/01/2023   Patient will be independent with initial home exercise program for self-management of symptoms. Baseline: Initial HEP to be provided at visit 2 as appropriate (03/18/23); Goal status: In-progress     LONG TERM GOALS: Target date: 04/29/2023    Patient will be independent with a long-term home exercise program for self-management of symptoms.  Baseline: Initial HEP to be provided at visit 2 as appropriate (03/18/23); Goal status: In-progress   2.  Patient will demonstrate improved FOTO by equal or greater than 10 points by visit #12 to demonstrate improvement in overall condition and self-reported functional ability.   Baseline: emailed to patient (03/18/23); Goal status: In-progress   3.  Patient will score equal or greater than 25/30 on Functional Gait Assessment to demonstrate low fall risk.  Baseline: to be tested at visit 2 as appropriate (03/18/23); Goal status: In-progress   4.  Patient will ambulate equal or greater than 1370 feet on 6 Minute Walk Test with LRAD to equal norm for community dwelling older adults between ages 12-89 years and improve his community mobility and ability to shop at big box stores.  Baseline: to be tested visit 2 as appropriate (03/18/23); 1170 feet (03/22/2023);  Goal status: In-progress   5.  Patient will score equal or greater than Level 2 on [place]ann Core Stability Test to demonstrate improved core strength and motor control to decrease feeling of weakness in the back and improve ability to ambulate in the community.  Baseline: To be tested at future date as appropriate (03/18/23); Level 1 (03/22/2023);  Goal status: In-progress       PLAN:   PT FREQUENCY: 1-2x/week   PT DURATION: 6 weeks   PLANNED INTERVENTIONS: Therapeutic exercises, Therapeutic activity, Neuromuscular re-education, Balance training, Gait training, Patient/Family education, Self Care, Joint mobilization, Stair training, DME instructions, Dry Needling, Electrical stimulation, Spinal mobilization, Cryotherapy, Moist heat, Manual therapy, and Re-evaluation.   PLAN FOR NEXT SESSION: Progressive core/LE/functional strengthening and balance exercises; neurodynamic exercises and manual therapy as needed.    Cira Rue, PT, DPT 04/22/2023, 12:03 PM  [facility] [address] [place], [place] [address] P: [phone] I F: [phone]

## 2023-04-27 ENCOUNTER — Ambulatory Visit: Payer: Medicare Other | Admitting: Physical Therapy

## 2023-04-27 ENCOUNTER — Other Ambulatory Visit: Payer: Self-pay | Admitting: Family Medicine

## 2023-04-27 DIAGNOSIS — G40909 Epilepsy, unspecified, not intractable, without status epilepticus: Secondary | ICD-10-CM

## 2023-04-28 ENCOUNTER — Encounter: Payer: Self-pay | Admitting: Cardiology

## 2023-04-28 ENCOUNTER — Ambulatory Visit: Payer: Medicare Other | Attending: Cardiology | Admitting: Cardiology

## 2023-04-28 VITALS — BP 148/82 | HR 69 | Resp 16 | Ht 71.0 in | Wt 166.8 lb

## 2023-04-28 DIAGNOSIS — I700 Atherosclerosis of aorta: Secondary | ICD-10-CM

## 2023-04-28 DIAGNOSIS — I2584 Coronary atherosclerosis due to calcified coronary lesion: Secondary | ICD-10-CM

## 2023-04-28 DIAGNOSIS — I10 Essential (primary) hypertension: Secondary | ICD-10-CM | POA: Diagnosis not present

## 2023-04-28 DIAGNOSIS — I2510 Atherosclerotic heart disease of native coronary artery without angina pectoris: Secondary | ICD-10-CM

## 2023-04-28 DIAGNOSIS — I639 Cerebral infarction, unspecified: Secondary | ICD-10-CM

## 2023-04-28 DIAGNOSIS — I671 Cerebral aneurysm, nonruptured: Secondary | ICD-10-CM | POA: Diagnosis not present

## 2023-04-28 DIAGNOSIS — G40909 Epilepsy, unspecified, not intractable, without status epilepticus: Secondary | ICD-10-CM

## 2023-04-28 DIAGNOSIS — E782 Mixed hyperlipidemia: Secondary | ICD-10-CM | POA: Diagnosis not present

## 2023-04-28 NOTE — Telephone Encounter (Signed)
He is currently undergoing phenytoin taper through neurology. Would call pt to see if he needs refill and what dose he's currently taking.

## 2023-04-28 NOTE — Telephone Encounter (Signed)
Spoke with pt's wife, Kathie Rhodes (on dpr), relaying Dr. Timoteo Expose message. She confirms neuro is tapering med. She says to deny requests for refills for this med from [facility]. States pt is currently taking 1 cap in AM and 1 cap at bedtime.

## 2023-04-28 NOTE — Telephone Encounter (Signed)
Dilantin Last filled:  03/03/23, #360 Last OV:  03/03/23, 4 mo f/u Next OV:  06/07/23, 3 mo f/u

## 2023-04-28 NOTE — Patient Instructions (Signed)
Medication Instructions:  Your Physician recommend you continue on your current medication as directed.     *If you need a refill on your cardiac medications before your next appointment, please call your pharmacy*   Lab Work: No labs ordered today.   If you have labs (blood work) drawn today and your tests are completely normal, you will receive your results only by: MyChart Message (if you have MyChart) OR A paper copy in the mail If you have any lab test that is abnormal or we need to change your treatment, we will call you to review the results.   Testing/Procedures: No testing ordered today.   Follow-Up: At [facility], you and your health needs are our priority.  As part of our continuing mission to provide you with exceptional heart care, we have created designated Provider Care Teams.  These Care Teams include your primary Cardiologist (physician) and Advanced Practice Providers (APPs -  Physician Assistants and Nurse Practitioners) who all work together to provide you with the care you need, when you need it.  We recommend signing up for the patient portal called "MyChart".  Sign up information is provided on this After Visit Summary.  MyChart is used to connect with patients for Virtual Visits (Telemedicine).  Patients are able to view lab/test results, encounter notes, upcoming appointments, etc.  Non-urgent messages can be sent to your provider as well.   To learn more about what you can do with MyChart, go to [email or web address].    Your next appointment:   6 month(s)  Provider:   Julien Nordmann, MD

## 2023-04-28 NOTE — Progress Notes (Signed)
Cardiology Office Note:   Date:  04/28/2023  ID:  Mark Holmes, DOB August 19, 1935, MRN [id number]  History of Present Illness:   Mark Holmes is a 88 y.o. 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  Previous hospitalization reviewed with brain MRI which showed subacute nonhemorrhagic infarct in the posterior left lower midbrain and 4th cranial nerve nucleus, (positive) right cerebellum, atrophy and white matter disease likely reflecting sequela of chronic microvascular ischemia.  He was evaluated by neurosurgery for his brainstem stroke.  Vascular imaging demonstrated a left-sided aneurysm.  It was decided at that time it was no intervention needed from neurosurgery and he was making a referral to a neurovascular specialist for evaluation and stroke treatment was deferred to neurology.  Prior to discharge he was placed on a ZIO monitor with concern of his brainstem stroke microembolic provide atrial fibrillation fortunately monitor revealed no atrial fibrillation at that time.  He was last seen in clinic 07/2022 and was feeling fairly well.  He had been advised by his neurologist that they would be changing his seizure medication.Follow-up with nephrology because he was increased amount of protein in his urine.  He required no medication changes and testing at that time.  He returns to clinic today stating that he has been doing well. He continues to do structured work-ups and is currently on a plan. Recently was told he had protein in his urine so had his ACEi dose increased. Neurology continues to wean him off of Dilantin. He hopes that once the medication has been weaned off that he will have decreased neuropathy symptoms in his BLE. 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 Denies any chest pain or shortness of breath. Occasionally has peripheral edema.   ROS: 10 point review of systems has been reviewed and is considered negative with the exception of what has been listed in the HPI  Studies Reviewed:    EKG:  sinus rate of 69, sinus arrhythmia, LVH, no acute changes noted from previous studies  Zio 07/20/22 Patient had a min HR of 53 bpm, max HR of 176 bpm, and avg HR of 78 bpm. Predominant underlying rhythm was Sinus Rhythm.  1 run of Ventricular Tachycardia occurred lasting 4 beats with a max rate of 164 bpm (avg 149 bpm).  18 Supraventricular Tachycardia runs occurred, the run with the fastest interval lasting 9 beats with a max rate of 176 bpm, the longest lasting 13 beats with an avg rate of 137 bpm.  Rare PACs and rare PVCs. No evidence of atrial fibrillation.  TTE 06/22/22  1. Left ventricular ejection fraction, by estimation, is 55 to 60%. The  left ventricle has normal function. The left ventricle has no regional  wall motion abnormalities. There is moderate left ventricular hypertrophy.  Left ventricular diastolic  parameters are consistent with Grade I diastolic dysfunction (impaired  relaxation).   2. Right ventricular systolic function is normal. The right ventricular  size is normal. There is mildly elevated pulmonary artery systolic  pressure.   3. Left atrial size was mildly dilated.   4. The mitral valve is normal in structure. Mild mitral valve  regurgitation. No evidence of mitral stenosis.   5. The  aortic valve is normal in structure. Aortic valve regurgitation is  not visualized. Mild aortic valve stenosis. Aortic valve area, by VTI  measures 1.31 cm. Aortic valve mean gradient measures 6.0 mmHg.   6. The inferior vena cava is normal in size with <50% respiratory  variability, suggesting right atrial pressure of 8 mmHg.    Risk Assessment/Calculations:      HYPERTENSION CONTROL Vitals:   04/28/23 1032 04/28/23 1045  BP: (!) 158/92 (!) 148/82    The patient's blood pressure is elevated above target today.  In order to address the patient's elevated BP: Blood pressure will be monitored at home to determine if medication changes need to be made.           Physical Exam:   VS:  BP (!) 148/82 (BP Location: Left Arm, Patient Position: Sitting, Cuff Size: Normal)   Pulse 69   Resp 16   Ht 5\' 11"  (1.803 m)   Wt 166 lb 12.8 oz (75.7 kg)   BMI 23.26 kg/m    Wt Readings from Last 3 Encounters:  04/28/23 166 lb 12.8 oz (75.7 kg)  03/03/23 170 lb 4 oz (77.2 kg)  02/08/23 174 lb (78.9 kg)     GEN: Well nourished, well developed in no acute distress NECK: No JVD; No carotid bruits CARDIAC: RRR, I/VI systolic murmur, without rubs, gallops RESPIRATORY:  Clear to auscultation without rales, wheezing or rhonchi  ABDOMEN: Soft, non-tender, non-distended EXTREMITIES:  Trace pretibial edema; No deformity, brace to right ankle and right wrist   ASSESSMENT AND PLAN:   Coronary artery calcification and aortic atherosclerosis noted on CT. Remains chest pain free. No ischemic changes noted on EKG. Continued on ASA and statin therapy.  Essential hypertension with blood pressure today of 158/92 with recheck 148/82. He has been continued on ramipril 10 mg twice daily. Recently increased by his PCP due to protein in his urine. Encouraged to decrease sodium, stay hydrated, and monitor blood pressure 1-2 hours post medication.   Mixed hyperlipidemia LDL 59 11/23. Continued on atorvastatin 40 mg daily. Will need repeat lipid panel on return.  Previous CVA with residual defects.  Previously was on aspirin and clopidogrel.  Clopidogrel was discontinued after 3 weeks.  He is continued on Lipitor 40 mg daily.  Continues to follow-up with neurology and neurosurgery.  Previous EF monitoring revealed no atrial fibrillation.  He did have an incidental brain  aneurysm that was found on screening.  Repeat screening shows a reduction in size status stable.  Surveillance monitoring to be managed by neurology/neurosurgery.  Seizure disorder.  Patient has been on Dilantin long-term.  He has not had a history of any recent seizures and neurology is working to decrease his dosing to get him off of Dilantin to see if it helps with his neuropathy.  Disposition patient return to clinic to see MD/APP in 6 months or sooner if needed.        Signed, Enmanuel Zufall, NP

## 2023-04-29 ENCOUNTER — Encounter: Payer: Self-pay | Admitting: Physical Therapy

## 2023-04-29 ENCOUNTER — Ambulatory Visit: Payer: Medicare Other | Admitting: Physical Therapy

## 2023-04-29 DIAGNOSIS — M5459 Other low back pain: Secondary | ICD-10-CM

## 2023-04-29 DIAGNOSIS — M6281 Muscle weakness (generalized): Secondary | ICD-10-CM

## 2023-04-29 DIAGNOSIS — R2689 Other abnormalities of gait and mobility: Secondary | ICD-10-CM

## 2023-04-29 DIAGNOSIS — R208 Other disturbances of skin sensation: Secondary | ICD-10-CM

## 2023-04-29 NOTE — Therapy (Signed)
OUTPATIENT PHYSICAL THERAPY TREATMENT NOTE / PROGRESS NOTE / RE-CERTIFICATION Dates of reporting from 03/18/2023 to 04/29/2023   Patient Name: Mark Holmes MRN: [id number] DOB:1935-01-07, 88 y.o., male Today's Date: 04/29/2023  PCP: Eustaquio Boyden, MD  REFERRING PROVIDER: Eustaquio Boyden, MD; also recommended by Cristopher Peru, MD (Neurology)   END OF SESSION:   PT End of Session - 04/29/23 1113     Visit Number 10    Number of Visits 16    Date for PT Re-Evaluation 05/21/23    Authorization Type [employer] MEDICARE reporting period from 03/18/2023    Progress Note Due on Visit 10    PT Start Time 1115    PT Stop Time 1155    PT Time Calculation (min) 40 min    Activity Tolerance Patient tolerated treatment well;Patient limited by fatigue;Patient limited by pain    Behavior During Therapy [facility] for tasks assessed/performed                  Past Medical History:  Diagnosis Date   Benign prostatic hypertrophy with elevated PSA    per Dr. Achilles Dunk   History of CT scan of brain 1992   normal   Hyperglycemia 01/2006   102   Hyperlipemia    Hypertension    Seizure disorder [facility])    Past Surgical History:  Procedure Laterality Date   ANTERIOR CERVICAL DECOMP/DISCECTOMY FUSION  05/2018   for myelopathy -C3/4 ([facility])   CARPAL TUNNEL RELEASE Right 06/2019   ([facility])   CATARACT EXTRACTION W/ INTRAOCULAR LENS IMPLANT Right 2012   [place] [facility]   CATARACT EXTRACTION W/PHACO Left 01/14/2022   Procedure: CATARACT EXTRACTION PHACO AND INTRAOCULAR LENS PLACEMENT (IOC) LEFT;  Surgeon: Lockie Mola, MD;  Location: [facility];  Service: Ophthalmology;  Laterality: Left;  7.55 01:03.6   History of EEG  1985   normal   hospitalization  1981   observation for seizuare   TONSILLECTOMY     TRANSURETHRAL RESECTION OF PROSTATE  10/2005   [facility]   TRANSURETHRAL RESECTION OF PROSTATE  08/2017   (rpt) [facility]   Patient Active Problem List    Diagnosis Date Noted   Spinal stenosis of lumbar region with neurogenic claudication 03/04/2023   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   Bilateral hearing loss 06/30/2022   Brain aneurysm 06/30/2022   History of ischemic stroke 06/20/2022   Nephrotic range proteinuria 06/04/2022   Hypoproteinemia (HCC) 06/01/2022   Atypical chest pain 07/10/2020   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   Unsteadiness on feet 11/08/2019   History of spinal surgery 01/17/2019   Neck pain 01/17/2019   Weakness of hand 01/17/2019   BPH (benign prostatic hyperplasia) 01/17/2019   Systolic murmur 07/04/2018   Right carpal tunnel syndrome 02/02/2018   Ulnar neuropathy at elbow, left 02/02/2018   Peripheral neuropathy 01/26/2018   Bilateral buttock pain 06/29/2016   Failed vision screen 06/29/2016   Advanced care planning/counseling discussion 06/21/2015   Medicare annual wellness visit, subsequent 06/18/2014   Health maintenance examination 06/18/2014   Chronic ankle pain 06/18/2014   Incomplete emptying of bladder 06/07/2013   Vitamin D deficiency 05/20/2013   THYROID NODULE 05/17/2007   Mixed hyperlipidemia 05/17/2007   Essential tremor 05/17/2007   Essential hypertension 05/17/2007   Cervical spondylosis with myelopathy and radiculopathy 05/17/2007   Seizure disorder (HCC) 05/17/2007    REFERRING DIAG: unsteadiness on feet, cervical spondylosis with myelopathy and radiculopathy, spinal stenosis of lumbar region with neurogenic claudication   THERAPY DIAG:  Other low back pain  Other disturbances of skin sensation  Other abnormalities of gait and mobility  Muscle weakness (generalized)  Rationale for Evaluation and Treatment Rehabilitation  PERTINENT HISTORY: Patient is a 88 y.o. male who presents to outpatient physical therapy with a referral for medical diagnosis unsteadiness on feet, cervical spondylosis with myelopathy and radiculopathy, spinal stenosis of lumbar region with neurogenic  claudication. This patient's chief complaints consist of burning and numbness in B lower legs as well as feeling of weakness in low back and lower legs leading to the following functional deficits: difficulty with balance, walking, sleeping, difficulty with getting up and moving around, household and community mobility, going out in the community, housework, gardening, going to the store.. 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  Patient denies hx of cancer, lung problems, heart problems, diabetes, unexplained weight loss, unexplained changes in bowel or bladder problems, and osteoporosis.   PRECAUTIONS: fall  SUBJECTIVE:                                                                                                                                                                                      SUBJECTIVE STATEMENT:  Patient arrives with [facility] and states his left shoulder is getting better.  He states he can  tell his legs are stronger on the stairs at home. He continues to be bothered by    PAIN:  NPRS 4/10 across low back.    OBJECTIVE   SELF-REPORTED FUNCTION FOTO score: 77/100 (NOC-Neuromuscular disorder questionnaire)   FUNCTIONAL/BALANCE TESTS 6 Minute Walk Test: 1451 feet while holding SPC, mild antalgic gait favoring R LE.  Reports pain in right ankle by end.    Sahrmann Core Stability Test: Level 3 with some difficulty keeping back down, able to move legs in level 2 position but unable to keep low back down.  Turkish Get Up: able to complete without weight or maintaining R UE in the air on right side with min A to get to from half kneel to stand and cuing for steps. Unable to complete left side due to R UE pain with weight bearing.    [facility] PT Assessment - 04/29/23 0001       Functional Gait  Assessment   Gait Level Surface Walks 20 ft in less than 5.5 sec, no assistive devices, good speed, no evidence for imbalance, normal gait pattern, deviates no more than 6 in outside of the 12 in walkway width.   self-selected pace: 5.66 seconds; fast pace: 4.29 seconds.   Change in Gait Speed Able to smoothly change walking speed without loss of balance or gait deviation. Deviate no more than 6 in outside of the 12 in walkway width.    Gait with Horizontal Head Turns Performs head turns smoothly with slight change in gait velocity (eg, minor disruption to smooth gait path), deviates 6-10 in outside 12 in walkway width, or uses an assistive device.    Gait with Vertical Head Turns Performs head turns with no change in gait. Deviates no more than 6 in outside 12 in walkway width.    Gait and Pivot Turn Pivot turns safely within 3 sec and stops quickly with no loss of balance.    Step Over Obstacle Is able to step over 2 stacked shoe boxes taped together (9 in total height) without changing gait speed. No evidence of imbalance.    Gait with Narrow Base of Support Ambulates less than 4 steps heel to  toe or cannot perform without assistance.    Gait with Eyes Closed Walks 20 ft, slow speed, abnormal gait pattern, evidence for imbalance, deviates 10-15 in outside 12 in walkway width. Requires more than 9 sec to ambulate 20 ft.   10.78 seconds   Ambulating Backwards Walks 20 ft, uses assistive device, slower speed, mild gait deviations, deviates 6-10 in outside 12 in walkway width.   10.48 seconds   Steps Alternating feet, must use rail.    Total Score 22    FGA comment: 19-24 = medium risk fall              TODAY'S TREATMENT:    Therapeutic exercise: to centralize symptoms and improve ROM, strength, muscular endurance, and activity tolerance required for successful completion of functional activities.  - testing to assess progress (see above) - practice of turkish get up on right side, 1x4 with min A to complete half kneel to stand and visual and verbal cuing for all steps.   Pt required multimodal cuing for proper technique and to facilitate improved neuromuscular control, strength, range of motion, and functional ability resulting in improved performance and form.     PATIENT EDUCATION:  Education details: Education on diagnosis, prognosis, POC, anatomy and physiology of current condition.  03/22/2023: Educated in PT request to not talk about PT's religious beliefs and not touching PT unnecessarily per PT preference in a professional setting. Patient states he understands and agrees.  Reviewed cancelation/no-show policy with patient and confirmed patient has correct phone number for clinic; patient verbalized understanding (03/24/23).  Person educated: Patient Education method: Explanation Education comprehension: verbalized understanding and needs further education   HOME EXERCISE PROGRAM: Access Code: [email or web address] URL: [email or web address] Date: 04/14/2023 Prepared by: Norton Blizzard  Exercises - The Hundred 3 Intermediate - Table Top  - 1 x daily - 10 reps - 30  seconds hold -  Bird Dog with Resistance  - 1 x daily - 2-3 sets - 10 reps - Child's Pose Stretch  - 1 x daily - 3 reps - 20 seconds hold - Bear Walk  - 1 x daily - 3 sets - 5 reps - Bird Dog with Knee Taps  - 3 x weekly - 3 sets - 10 reps  HOME EXERCISE PROGRAM [[email or web address]] View at "[email or web address]" using code: [email or web address] Modified [place] -  Repeat 10 Repetitions, Complete 2 Sets, Perform 3 Times a Week  HOME EXERCISE PROGRAM [[id number]] View at "[email or web address]" using code: [email or web address]  [place] Get Up part 2 -  Repeat 10 Repetitions, Complete 2 Sets, Perform 3 Times a Week  [place] Get Up part 1 -  Repeat 10 Repetitions, Complete 2 Sets, Perform 3 Times a Week  [place] Get Up part 4 -  Repeat 10 Repetitions, Complete 2 Sets, Perform 3 Times a Week  [place] Get Up Modified part 5 -  Repeat 10 Repetitions, Complete 2 Sets, Perform 3 Times a Week  HOME EXERCISE PROGRAM [[id number]] View at "[email or web address]" using code: [email or web address] DIPS IN CHAIR -  Repeat 3 Repetitions, Hold 1 Second(s), Complete 3 Sets, Perform 3 Times a Week   ASSESSMENT:   CLINICAL IMPRESSION: Patient has attended 10 physical therapy sessions since starting current episode of care on 03/18/2023. He has met all of his goals except FGA goal, which has improved by 2 points since initial evaluation. Patient has also made progress towards being able to perform AROM turkish get up. However, his recent R shoulder injury from his dog has limited his ability to practice or get stronger with the left-sided turkish get up. PT recommends patient continue with PT until the end of the month to continue improving FGA score and prepare for discharge. Patient would benefit from continued management of limiting condition by skilled physical therapist to address remaining impairments and functional limitations to work towards stated goals and return to PLOF or maximal functional independence.    From Initial PT Evaluation 03/18/2023:  Patient  is a 88 y.o. male referred to outpatient physical therapy with a medical diagnosis of unsteadiness on feet, cervical spondylosis with myelopathy and radiculopathy, spinal stenosis of lumbar region with neurogenic claudication who presents with signs and symptoms consistent with severe sensory B LE neuropathy, with ataxia and imbalance related to multifactorial contributors such as previously identified and surgically addressed cervical myelopathy, evidence of prior lacunar infarcts in cerebellum, severe chronic R ankle osteoarthritis (as previously established). Patient does describe pattern of difficulty with mobility consistent with aspects of spinal stenosis with neurogenic claudication but he's excellent lumbar extension and report of improvement after repeated extensions decreases the likelihood of this condition in light of the other contributing factors. He does have positive SLR bilaterally, which is easily  negative in spinal stenosis with neurogenic claudication due to the position of the spine during testing, but can be positive in other types of sciatica. Of note, patient reported the symptoms the SLR reproduced were not consistent with his concordant sign. The most likely strongest contributing factor to patient's chief complaint of feeling of numbness and weakness with prolonged weight bearing is severe sensory neuropathy. His neurologist is working with him to wean from Dilantin that has been identified by his medical team as likely contributor to his neuropathy, but it is unclear to this PT if he has potential for improvement once off of it or if it will only decrease/halt the speed of progression of neuropathy.  Patient was  again educated that physical therapy has limited effects on improving unpleasant sensations or numbness from peripheral neuropathy, but can have some effect on improving strength, balance, and function. Physical therapy can also be effective in improving low back discomfort and  strength. Patient would benefit from a bout of physical therapy to help address functional, balance, and lumbar stability/strength deficits he complains of and demonstrates during session..Patient presents with significant pain, paresthesia, muscle performance (strength/power/endurance), ataxia, coordination, joint stiffness, sensory, proprioceptive, knowledge, balance, gait, and activity tolerance  impairments that are limiting ability to complete usual activities including balance, walking, sleeping, difficulty with getting up and moving around, household and community mobility, going out in the community, housework, gardening, going to the store without difficulty. Patient will benefit from skilled physical therapy intervention to address current body structure impairments and activity limitations to improve function and work towards goals set in current POC in order to return to prior level of function or maximal functional improvement.    OBJECTIVE IMPAIRMENTS: Abnormal gait, decreased activity tolerance, decreased balance, decreased coordination, decreased endurance, decreased knowledge of condition, decreased mobility, difficulty walking, decreased ROM, decreased strength, hypomobility, impaired perceived functional ability, impaired sensation, and pain.    ACTIVITY LIMITATIONS: carrying, lifting, sitting, standing, squatting, sleeping, stairs, transfers, dressing, and locomotion level   PARTICIPATION LIMITATIONS: meal prep, cleaning, interpersonal relationship, shopping, community activity, yard work, and   difficulty with balance, walking, sleeping, difficulty with getting up and moving around, household and community mobility, going out in the community, housework, gardening, going to the store   PERSONAL FACTORS: Behavior pattern, Past/current experiences, Time since onset of injury/illness/exacerbation, and 3+ comorbidities:   CVA on 06/20/2022 (MRI showed punctate acute/subacute nonhemorrhagic  infarct in the posterior left lower midbrain near the 4th cranial nerve nucleus with remote lacunar infarcts of the right thalamus and right cerebellum),  4mm PCOM aneurysm (found incidentally on CTA 06/20/2022, currently undergoing conservative monitoring), concern over dilantin related neuropathy (starting to wean off), systolic murmur, benign prostatic hyperplasia with urinary obstruction (followed by urology), chronic R ankle pain (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 are also affecting patient's functional outcome.    REHAB POTENTIAL: Fair due to likelihood of neuropathy being the root cause of his chief complaint.   CLINICAL DECISION MAKING: Evolving/moderate complexity   EVALUATION COMPLEXITY: Moderate     GOALS: Goals reviewed with patient? No   SHORT TERM GOALS: Target date: 04/01/2023   Patient will be independent with initial home exercise program for self-management of symptoms. Baseline: Initial HEP to be provided at visit 2 as appropriate (03/18/23); Goal status: MET     LONG TERM GOALS: Target date: 04/29/2023. Target date updated to 05/21/2023 for all unmet goals on 04/29/2023.   Patient will be independent with a long-term home exercise program for self-management of symptoms.  Baseline: Initial HEP to be provided at visit 2 as appropriate (03/18/23); participating regularly (04/29/2023); Goal status: In-progress   2.  Patient will demonstrate improved FOTO by equal or greater than 10 points by visit #12 to demonstrate improvement in  overall condition and self-reported functional ability.  Baseline: emailed to patient (03/18/23);  77 at visit #10 (04/29/2023);  Goal status: In-progress   3.  Patient will score equal or greater than 25/30 on Functional Gait Assessment to demonstrate low fall risk.  Baseline: to be tested at visit 2 as appropriate (03/18/23); 20/30 moderate fall risk (03/22/2023); 22/30 moderate fall risk (04/29/2023);  Goal status: In-progress   4.  Patient will ambulate equal or greater than 1370 feet on 6 Minute Walk Test with LRAD to equal norm for community dwelling older adults between ages 54-89 years and improve his community mobility and ability to shop at big box stores.  Baseline: to be tested visit 2 as appropriate (03/18/23); 1170 feet (03/22/2023); 1451 feet while holding SPC, mild antalgic gait favoring R LE.  Reports pain in right ankle by end (04/29/2023);  Goal status: MET   5.  Patient will score equal or greater than Level 2 on Sahrmann Core Stability Test to demonstrate improved core strength and motor control to decrease feeling of weakness in the back and improve ability to ambulate in the community.  Baseline: To be tested at future date as appropriate (03/18/23); Level 1 (03/22/2023); level 3 (04/29/2023);  Goal status: MET       PLAN:   PT FREQUENCY: 1-2x/week   PT DURATION: 3 weeks   PLANNED INTERVENTIONS: Therapeutic exercises, Therapeutic activity, Neuromuscular re-education, Balance training, Gait training, Patient/Family education, Self Care, Joint mobilization, Stair training, DME instructions, Dry Needling, Electrical stimulation, Spinal mobilization, Cryotherapy, Moist heat, Manual therapy, and Re-evaluation.   PLAN FOR NEXT SESSION: Progressive core/LE/functional strengthening and balance exercises; neurodynamic exercises and manual therapy as needed.    Cira Rue, PT, DPT 04/29/2023, 3:34 PM  [facility] [address] [place], [place]  [address] P: [phone] I F: [phone]

## 2023-04-30 NOTE — Telephone Encounter (Signed)
Noted thanks °

## 2023-05-04 ENCOUNTER — Ambulatory Visit: Payer: Medicare Other | Admitting: Physical Therapy

## 2023-05-04 ENCOUNTER — Encounter: Payer: Self-pay | Admitting: Physical Therapy

## 2023-05-04 DIAGNOSIS — M5459 Other low back pain: Secondary | ICD-10-CM | POA: Diagnosis not present

## 2023-05-04 DIAGNOSIS — M6281 Muscle weakness (generalized): Secondary | ICD-10-CM

## 2023-05-04 DIAGNOSIS — R208 Other disturbances of skin sensation: Secondary | ICD-10-CM

## 2023-05-04 DIAGNOSIS — R2689 Other abnormalities of gait and mobility: Secondary | ICD-10-CM

## 2023-05-04 NOTE — Therapy (Signed)
OUTPATIENT PHYSICAL THERAPY TREATMENT NOTE   Patient Name: Mark Holmes MRN: [id number] DOB:10-Jul-1935, 88 y.o., male Today's Date: 05/04/2023  PCP: Eustaquio Boyden, MD  REFERRING PROVIDER: Eustaquio Boyden, MD; also recommended by Cristopher Peru, MD (Neurology)   END OF SESSION:   PT End of Session - 05/04/23 1125     Visit Number 11    Number of Visits 16    Date for PT Re-Evaluation 05/21/23    Authorization Type [employer] MEDICARE reporting period from 03/18/2023    Progress Note Due on Visit 10    PT Start Time 1124    PT Stop Time 1204    PT Time Calculation (min) 40 min    Activity Tolerance Patient tolerated treatment well;Patient limited by fatigue;Patient limited by pain    Behavior During Therapy [facility] for tasks assessed/performed                   Past Medical History:  Diagnosis Date   Benign prostatic hypertrophy with elevated PSA    per Dr. Achilles Dunk   History of CT scan of brain 1992   normal   Hyperglycemia 01/2006   102   Hyperlipemia    Hypertension    Seizure disorder [facility])    Past Surgical History:  Procedure Laterality Date   ANTERIOR CERVICAL DECOMP/DISCECTOMY FUSION  05/2018   for myelopathy -C3/4 ([facility])   CARPAL TUNNEL RELEASE Right 06/2019   ([facility])   CATARACT EXTRACTION W/ INTRAOCULAR LENS IMPLANT Right 2012   [facility]   CATARACT EXTRACTION W/PHACO Left 01/14/2022   Procedure: CATARACT EXTRACTION PHACO AND INTRAOCULAR LENS PLACEMENT (IOC) LEFT;  Surgeon: Lockie Mola, MD;  Location: [facility];  Service: Ophthalmology;  Laterality: Left;  7.55 01:03.6   History of EEG  1985   normal   hospitalization  1981   observation for seizuare   TONSILLECTOMY     TRANSURETHRAL RESECTION OF PROSTATE  10/2005   [facility]   TRANSURETHRAL RESECTION OF PROSTATE  08/2017   (rpt) [facility]   Patient Active Problem List   Diagnosis Date Noted   Spinal stenosis of lumbar region with neurogenic  claudication 03/04/2023   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   Bilateral hearing loss 06/30/2022   Brain aneurysm 06/30/2022   History of ischemic stroke 06/20/2022   Nephrotic range proteinuria 06/04/2022   Hypoproteinemia (HCC) 06/01/2022   Atypical chest pain 07/10/2020   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   Unsteadiness on feet 11/08/2019   History of spinal surgery 01/17/2019   Neck pain 01/17/2019   Weakness of hand 01/17/2019   BPH (benign prostatic hyperplasia) 01/17/2019   Systolic murmur 07/04/2018   Right carpal tunnel syndrome 02/02/2018   Ulnar neuropathy at elbow, left 02/02/2018   Peripheral neuropathy 01/26/2018   Bilateral buttock pain 06/29/2016   Failed vision screen 06/29/2016   Advanced care planning/counseling discussion 06/21/2015   Medicare annual wellness visit, subsequent 06/18/2014   Health maintenance examination 06/18/2014   Chronic ankle pain 06/18/2014   Incomplete emptying of bladder 06/07/2013   Vitamin D deficiency 05/20/2013   THYROID NODULE 05/17/2007   Mixed hyperlipidemia 05/17/2007   Essential tremor 05/17/2007   Essential hypertension 05/17/2007   Cervical spondylosis with myelopathy and radiculopathy 05/17/2007   Seizure disorder (HCC) 05/17/2007    REFERRING DIAG: unsteadiness on feet, cervical spondylosis with myelopathy and radiculopathy, spinal stenosis of lumbar region with neurogenic claudication   THERAPY DIAG:  Other low back pain  Other disturbances of skin sensation  Other abnormalities of gait and mobility  Muscle weakness (generalized)  Rationale for Evaluation and Treatment Rehabilitation  PERTINENT HISTORY: Patient is a 88 y.o. male who presents to outpatient physical therapy with a referral for medical diagnosis unsteadiness on feet, cervical spondylosis with myelopathy and radiculopathy, spinal stenosis of lumbar region with neurogenic claudication. This patient's chief complaints consist of burning and numbness  in B lower legs as well as feeling of weakness in low back and lower legs leading to the following functional deficits: difficulty with balance, walking, sleeping, difficulty with getting up and moving around, household and community mobility, going out in the community, housework, gardening, going to the store.. 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  Patient denies hx of cancer, lung problems, heart problems, diabetes, unexplained weight loss, unexplained changes in bowel or bladder problems, and osteoporosis.   PRECAUTIONS: fall  SUBJECTIVE:                                                                                                                                                                                      SUBJECTIVE STATEMENT:  Patient arrives with [facility] and states his left shoulder is doing better, but still hurts. He has made an exercise guide for himself based on the handout PT gave him last week  on Sahrmann Core Stability Test   PAIN:  NPRS 0/10 except numbness from the knees down.    OBJECTIVE     TODAY'S TREATMENT:    Therapeutic exercise: to centralize symptoms and improve ROM, strength, muscular endurance, and activity tolerance required for successful completion of functional activities.  - seated knee extension at [facility] machine, seated position 2, B LE, 1x10 at 55#, single leg: R 3x10 at 25#, L 3x10 at 25#).   - seated glute thrust with back on 17 inch chair covered with towel, 2x10  AROM.  - single leg glute thrust AROM, able to do 1x5 on L side, partial ROM and very difficult due to lack of strength.  - seated glute thrust with back on 17 inch chair covered with towel, 1x10 with 23#BB over hips.  - seated glute thrust with back on 17 inch chair, 3x10 with 33#BB over hips with folded towl under bar. (Cuing for weight through heels). - reverse lunge 2x10 each side, progressing to holding PVC stick for support instead of TM bar. Pain on R shoulder by end of second set.   Pt required multimodal cuing for proper technique and to facilitate improved neuromuscular control, strength, range of motion, and functional ability resulting in improved performance and form.     PATIENT EDUCATION:  Education details: Education on diagnosis, prognosis, POC, anatomy and physiology of current condition.  03/22/2023: Educated in PT request to not talk about PT's religious beliefs and not touching PT unnecessarily per PT preference in a professional setting. Patient states he understands and agrees.  Reviewed cancelation/no-show policy with patient and confirmed patient has correct phone number for clinic; patient verbalized understanding (03/24/23).  Person educated: Patient Education method: Explanation Education comprehension: verbalized understanding and needs further education   HOME EXERCISE PROGRAM: Access Code: [email or web address] URL: [email or web address] Date:  04/14/2023 Prepared by: Norton Blizzard  Exercises - The Hundred 3 Intermediate - Table Top  - 1 x daily - 10 reps - 30 seconds hold - Bird Dog with Resistance  - 1 x daily - 2-3 sets - 10 reps - Child's Pose Stretch  - 1 x daily - 3 reps - 20 seconds hold - Bear Walk  - 1 x daily - 3 sets - 5 reps - Bird Dog with Knee Taps  - 3 x weekly - 3 sets - 10 reps  HOME EXERCISE PROGRAM [[email or web address]] View at "[email or web address]" using code: [email or web address] Modified [place] -  Repeat 10 Repetitions, Complete 2 Sets, Perform 3 Times a Week  HOME EXERCISE PROGRAM [[id number]] View at "[email or web address]" using code: [email or web address]  [place] Get Up part 2 -  Repeat 10 Repetitions, Complete 2 Sets, Perform 3 Times a Week  [place] Get Up part 1 -  Repeat 10 Repetitions, Complete 2 Sets, Perform 3 Times a Week  [place] Get Up part 4 -  Repeat 10 Repetitions, Complete 2 Sets, Perform 3 Times a Week  [place] Get Up Modified part 5 -  Repeat 10 Repetitions, Complete 2 Sets, Perform 3 Times a Week  HOME EXERCISE PROGRAM [[id number]] View at "[email or web address]" using code: [email or web address] DIPS IN CHAIR -  Repeat 3 Repetitions, Hold 1 Second(s), Complete 3 Sets, Perform 3 Times a Week   ASSESSMENT:   CLINICAL IMPRESSION: Patient arrives reporting R shoulder pain slightly better. Today's sesison continued to focus on strengthening B LE and functional movements for improved balance. Patient had some limitations due to recent R shoulder injury and chronic R ankle instability. He gave excellent effort and was appropriately challenged by exercises. He needed SBA guarding for reverse lunge due to unsteadiness on his feet. Patient would benefit from continued management of limiting condition by skilled physical therapist to address remaining impairments and functional limitations to work towards stated goals and return to PLOF or maximal functional independence.   From Initial PT Evaluation 03/18/2023:  Patient is a 89 y.o. male referred to  outpatient physical therapy with a medical diagnosis of unsteadiness on feet, cervical spondylosis with myelopathy and radiculopathy, spinal stenosis of lumbar region with neurogenic claudication who presents  with signs and symptoms consistent with severe sensory B LE neuropathy, with ataxia and imbalance related to multifactorial contributors such as previously identified and surgically addressed cervical myelopathy, evidence of prior lacunar infarcts in cerebellum, severe chronic R ankle osteoarthritis (as previously established). Patient does describe pattern of difficulty with mobility consistent with aspects of spinal stenosis with neurogenic claudication but he's excellent lumbar extension and report of improvement after repeated extensions decreases the likelihood of this condition in light of the other contributing factors. He does have positive SLR bilaterally, which is easily  negative in spinal stenosis with neurogenic claudication due to the position of the spine during testing, but can be positive in other types of sciatica. Of note, patient reported the symptoms the SLR reproduced were not consistent with his concordant sign. The most likely strongest contributing factor to patient's chief complaint of feeling of numbness and weakness with prolonged weight bearing is severe sensory neuropathy. His neurologist is working with him to wean from Dilantin that has been identified by his medical team as likely contributor to his neuropathy, but it is unclear to this PT if he has potential for improvement once off of it or if it will only decrease/halt the speed of progression of neuropathy.  Patient was again educated that physical therapy has limited effects on improving unpleasant sensations or numbness from peripheral neuropathy, but can have some effect on improving strength, balance, and function. Physical therapy can also be effective in improving low back discomfort and strength. Patient would benefit  from a bout of physical therapy to help address functional, balance, and lumbar stability/strength deficits he complains of and demonstrates during session..Patient presents with significant pain, paresthesia, muscle performance (strength/power/endurance), ataxia, coordination, joint stiffness, sensory, proprioceptive, knowledge, balance, gait, and activity tolerance  impairments that are limiting ability to complete usual activities including balance, walking, sleeping, difficulty with getting up and moving around, household and community mobility, going out in the community, housework, gardening, going to the store without difficulty. Patient will benefit from skilled physical therapy intervention to address current body structure impairments and activity limitations to improve function and work towards goals set in current POC in order to return to prior level of function or maximal functional improvement.    OBJECTIVE IMPAIRMENTS: Abnormal gait, decreased activity tolerance, decreased balance, decreased coordination, decreased endurance, decreased knowledge of condition, decreased mobility, difficulty walking, decreased ROM, decreased strength, hypomobility, impaired perceived functional ability, impaired sensation, and pain.    ACTIVITY LIMITATIONS: carrying, lifting, sitting, standing, squatting, sleeping, stairs, transfers, dressing, and locomotion level   PARTICIPATION LIMITATIONS: meal prep, cleaning, interpersonal relationship, shopping, community activity, yard work, and   difficulty with balance, walking, sleeping, difficulty with getting up and moving around, household and community mobility, going out in the community, housework, gardening, going to the store   PERSONAL FACTORS: Behavior pattern, Past/current experiences, Time since onset of injury/illness/exacerbation, and 3+ comorbidities:   CVA on 06/20/2022 (MRI showed punctate acute/subacute nonhemorrhagic infarct in the posterior left lower  midbrain near the 4th cranial nerve nucleus with remote lacunar infarcts of the right thalamus and right cerebellum),  4mm PCOM aneurysm (found incidentally on CTA 06/20/2022, currently undergoing conservative monitoring), concern over dilantin related neuropathy (starting to wean off), systolic murmur, benign prostatic hyperplasia with urinary obstruction (followed by urology), chronic R ankle pain (likely due to post traumatic arthritis), seizure disorder (controlled over many years with dilantin), low back pain, ACDR for myelopathy at C3-C4, 05/2018), cataract surgeries (bilateral), right Carpal tunnel release (06/2019), symptoms  of peripheral neuropathy at all limbs (fine motor skills in B UE affected), perfuse degenerative changes on lumbar and cervical spine imaging (see chart for details), chronic kidney disease (concern over it being related to dilantin, followed by nephrology). are also affecting patient's functional outcome.    REHAB POTENTIAL: Fair due to likelihood of neuropathy being the root cause of his chief complaint.   CLINICAL DECISION MAKING: Evolving/moderate complexity   EVALUATION COMPLEXITY: Moderate     GOALS: Goals reviewed with patient? No   SHORT TERM GOALS: Target date: 04/01/2023   Patient will be independent with initial home exercise program for self-management of symptoms. Baseline: Initial HEP to be provided at visit 2 as appropriate (03/18/23); Goal status: MET     LONG TERM GOALS: Target date: 04/29/2023. Target date updated to 05/21/2023 for all unmet goals on 04/29/2023.   Patient will be independent with a long-term home exercise program for self-management of symptoms.  Baseline: Initial HEP to be provided at visit 2 as appropriate (03/18/23); participating regularly (04/29/2023); Goal status: In-progress   2.  Patient will demonstrate improved FOTO by equal or greater than 10 points by visit #12 to demonstrate improvement in overall condition and self-reported  functional ability.  Baseline: emailed to patient (03/18/23); 77 at visit #10 (04/29/2023);  Goal status: In-progress   3.  Patient will score equal or greater than 25/30 on Functional Gait Assessment to demonstrate low fall risk.  Baseline: to be tested at visit 2 as appropriate (03/18/23); 20/30 moderate fall risk (03/22/2023); 22/30 moderate fall risk (04/29/2023);  Goal status: In-progress   4.  Patient will ambulate equal or greater than 1370 feet on 6 Minute Walk Test with LRAD to equal norm for community dwelling older adults between ages 37-89 years and improve his community mobility and ability to shop at big box stores.  Baseline: to be tested visit 2 as appropriate (03/18/23); 1170 feet (03/22/2023); 1451 feet while holding SPC, mild antalgic gait favoring R LE.  Reports pain in right ankle by end (04/29/2023);  Goal status: MET   5.  Patient will score equal or greater than Level 2 on Sahrmann Core Stability Test to demonstrate improved core strength and motor control to decrease feeling of weakness in the back and improve ability to ambulate in the community.  Baseline: To be tested at future date as appropriate (03/18/23); Level 1 (03/22/2023); level 3 (04/29/2023);  Goal status: MET       PLAN:   PT FREQUENCY: 1-2x/week   PT DURATION: 3 weeks   PLANNED INTERVENTIONS: Therapeutic exercises, Therapeutic activity, Neuromuscular re-education, Balance training, Gait training, Patient/Family education, Self Care, Joint mobilization, Stair training, DME instructions, Dry Needling, Electrical stimulation, Spinal mobilization, Cryotherapy, Moist heat, Manual therapy, and Re-evaluation.   PLAN FOR NEXT SESSION: Progressive core/LE/functional strengthening and balance exercises; neurodynamic exercises and manual therapy as needed.    Cira Rue, PT, DPT 05/04/2023, 12:13 PM  [facility] [address] [place], [place] [address] P: [phone] I F:  [phone]

## 2023-05-06 ENCOUNTER — Ambulatory Visit: Payer: Medicare Other | Admitting: Physical Therapy

## 2023-05-06 ENCOUNTER — Encounter: Payer: Self-pay | Admitting: Physical Therapy

## 2023-05-06 DIAGNOSIS — R208 Other disturbances of skin sensation: Secondary | ICD-10-CM

## 2023-05-06 DIAGNOSIS — M5459 Other low back pain: Secondary | ICD-10-CM

## 2023-05-06 DIAGNOSIS — R2689 Other abnormalities of gait and mobility: Secondary | ICD-10-CM

## 2023-05-06 DIAGNOSIS — M6281 Muscle weakness (generalized): Secondary | ICD-10-CM

## 2023-05-06 NOTE — Therapy (Signed)
OUTPATIENT PHYSICAL THERAPY TREATMENT NOTE   Patient Name: Mark Holmes MRN: [id number] DOB:1935-11-28, 88 y.o., male Today's Date: 05/06/2023  PCP: Eustaquio Boyden, MD  REFERRING PROVIDER: Eustaquio Boyden, MD; also recommended by Cristopher Peru, MD (Neurology)   END OF SESSION:   PT End of Session - 05/06/23 1130     Visit Number 12    Number of Visits 16    Date for PT Re-Evaluation 05/21/23    Authorization Type [employer] MEDICARE reporting period from 03/18/2023    Progress Note Due on Visit 10    PT Start Time 1117    PT Stop Time 1200    PT Time Calculation (min) 43 min    Activity Tolerance Patient tolerated treatment well;Patient limited by fatigue;Patient limited by pain    Behavior During Therapy [facility] for tasks assessed/performed                    Past Medical History:  Diagnosis Date   Benign prostatic hypertrophy with elevated PSA    per Dr. Achilles Dunk   History of CT scan of brain 1992   normal   Hyperglycemia 01/2006   102   Hyperlipemia    Hypertension    Seizure disorder [facility])    Past Surgical History:  Procedure Laterality Date   ANTERIOR CERVICAL DECOMP/DISCECTOMY FUSION  05/2018   for myelopathy -C3/4 ([facility])   CARPAL TUNNEL RELEASE Right 06/2019   ([facility])   CATARACT EXTRACTION W/ INTRAOCULAR LENS IMPLANT Right 2012   [place] [facility]   CATARACT EXTRACTION W/PHACO Left 01/14/2022   Procedure: CATARACT EXTRACTION PHACO AND INTRAOCULAR LENS PLACEMENT (IOC) LEFT;  Surgeon: Lockie Mola, MD;  Location: [facility];  Service: Ophthalmology;  Laterality: Left;  7.55 01:03.6   History of EEG  1985   normal   hospitalization  1981   observation for seizuare   TONSILLECTOMY     TRANSURETHRAL RESECTION OF PROSTATE  10/2005   [facility]   TRANSURETHRAL RESECTION OF PROSTATE  08/2017   (rpt) [facility]   Patient Active Problem List   Diagnosis Date Noted   Spinal stenosis of lumbar region with neurogenic  claudication 03/04/2023   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   Bilateral hearing loss 06/30/2022   Brain aneurysm 06/30/2022   History of ischemic stroke 06/20/2022   Nephrotic range proteinuria 06/04/2022   Hypoproteinemia (HCC) 06/01/2022   Atypical chest pain 07/10/2020   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   Unsteadiness on feet 11/08/2019   History of spinal surgery 01/17/2019   Neck pain 01/17/2019   Weakness of hand 01/17/2019   BPH (benign prostatic hyperplasia) 01/17/2019   Systolic murmur 07/04/2018   Right carpal tunnel syndrome 02/02/2018   Ulnar neuropathy at elbow, left 02/02/2018   Peripheral neuropathy 01/26/2018   Bilateral buttock pain 06/29/2016   Failed vision screen 06/29/2016   Advanced care planning/counseling discussion 06/21/2015   Medicare annual wellness visit, subsequent 06/18/2014   Health maintenance examination 06/18/2014   Chronic ankle pain 06/18/2014   Incomplete emptying of bladder 06/07/2013   Vitamin D deficiency 05/20/2013   THYROID NODULE 05/17/2007   Mixed hyperlipidemia 05/17/2007   Essential tremor 05/17/2007   Essential hypertension 05/17/2007   Cervical spondylosis with myelopathy and radiculopathy 05/17/2007   Seizure disorder (HCC) 05/17/2007    REFERRING DIAG: unsteadiness on feet, cervical spondylosis with myelopathy and radiculopathy, spinal stenosis of lumbar region with neurogenic claudication   THERAPY DIAG:  Other low back pain  Other disturbances of skin sensation  Other abnormalities of gait and mobility  Muscle weakness (generalized)  Rationale for Evaluation and Treatment Rehabilitation  PERTINENT HISTORY: Patient is a 88 y.o. male who presents to outpatient physical therapy with a referral for medical diagnosis unsteadiness on feet, cervical spondylosis with myelopathy and radiculopathy, spinal stenosis of lumbar region with neurogenic claudication. This patient's chief complaints consist of burning and numbness  in B lower legs as well as feeling of weakness in low back and lower legs leading to the following functional deficits: difficulty with balance, walking, sleeping, difficulty with getting up and moving around, household and community mobility, going out in the community, housework, gardening, going to the store.. 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  Patient denies hx of cancer, lung problems, heart problems, diabetes, unexplained weight loss, unexplained changes in bowel or bladder problems, and osteoporosis.   PRECAUTIONS: fall  SUBJECTIVE:                                                                                                                                                                                      SUBJECTIVE STATEMENT:  Patient arrives with [facility] and states his R shoulder is continuing to get better.    PAIN:  NPRS 0/10 except numbness from the knees down.    OBJECTIVE     TODAY'S  TREATMENT:    Therapeutic exercise: to centralize symptoms and improve ROM, strength, muscular endurance, and activity tolerance required for successful completion of functional activities.  - backwards walking on TM, 1x5 min with SBA and B UE support to improve gait stability. for 5 min.  - squat to chair tap, 18 inch chair, 1x10.  - reverse lunge 2x10 each side, progressing to holding PVC stick for support instead of TM bar. Discomfort on R shoulder by end of second set. Patient working on lifting his  legs sufficiently to prevent tripping over the a2z pad placed under knee.  - seated knee extension at [facility] machine, seated position 2, B LE, 1x10 at 55#, single leg: R 3x10 at 25# (poor ROM last few reps), L 3x10 at 25#.   - seated glute thrust with back on 17 inch chair covered with towel, 1x10 AROM. Doubled up mat under buttocks.  - seated glute thrust with back on 17 inch chair covered with towel, 1x10 with 23#BB over hips. Doubled up mat under buttocks - seated glute thrust with back on 17 inch chair, 3x10 with 33#BB over hips with folded towl under bar. (Cuing for weight through heels). Doubled up mat under buttocks.  Pt required multimodal cuing for proper technique and to facilitate improved neuromuscular control, strength, range of motion, and functional ability resulting in improved performance and form.     PATIENT EDUCATION:  Education details: Education on diagnosis, prognosis, POC, anatomy and physiology of current condition.  03/22/2023: Educated in PT request to not talk about PT's religious beliefs and not touching PT unnecessarily per PT preference in a professional setting. Patient states he understands and agrees.  Reviewed cancelation/no-show policy with patient and confirmed patient has correct phone number for clinic; patient verbalized understanding (03/24/23).  Person educated: Patient Education method: Explanation Education comprehension: verbalized understanding and  needs further education   HOME EXERCISE PROGRAM: Access Code: [email or web address] URL: [email or web address] Date: 04/14/2023 Prepared by: Norton Blizzard  Exercises - The Hundred 3 Intermediate - Table Top  - 1 x daily - 10 reps - 30 seconds hold - Bird Dog with Resistance  - 1 x daily - 2-3 sets - 10 reps - Child's Pose Stretch  - 1 x daily - 3 reps - 20 seconds hold - Bear Walk  - 1 x daily - 3 sets - 5 reps - Bird Dog with Knee Taps  - 3 x weekly - 3 sets - 10 reps  HOME EXERCISE PROGRAM [[email or web address]] View at "[email or web address]" using code: [email or web address] Modified [place] -  Repeat 10 Repetitions, Complete 2 Sets, Perform 3 Times a Week  HOME EXERCISE PROGRAM [[email or web address]] View at "[email or web address]" using code: [email or web address]  [place] Get Up part 2 -  Repeat 10 Repetitions, Complete 2 Sets, Perform 3 Times a Week  [place] Get Up part 1 -  Repeat 10 Repetitions, Complete 2 Sets, Perform 3 Times a Week  [place] Get Up part 4 -  Repeat 10 Repetitions, Complete 2 Sets, Perform 3 Times a Week  [place] Get Up Modified part 5 -  Repeat 10 Repetitions, Complete 2 Sets, Perform 3 Times a Week  HOME EXERCISE PROGRAM [[id number]] View at "[email or web address]" using code: [email or web address] DIPS IN CHAIR -  Repeat 3 Repetitions, Hold 1 Second(s), Complete 3 Sets, Perform 3 Times a Week   ASSESSMENT:   CLINICAL IMPRESSION: Patient arrives reporting improving R shoulder pain. Today's session continued with B LE/core/functional strengthening and balance exercises. Patient was adequately challenging and required cuing from PT for improved form. He continues to struggle with balance and LE strength. Patient would benefit from continued management of limiting condition by skilled physical therapist to address remaining impairments and functional limitations to work towards stated goals and return to PLOF or maximal functional independence.   From Initial PT Evaluation 03/18/2023:  Patient is a 88 y.o. male referred to  outpatient physical therapy with a medical diagnosis of unsteadiness on feet, cervical spondylosis with myelopathy and radiculopathy, spinal stenosis of lumbar region with neurogenic claudication who presents with  signs and symptoms consistent with severe sensory B LE neuropathy, with ataxia and imbalance related to multifactorial contributors such as previously identified and surgically addressed cervical myelopathy, evidence of prior lacunar infarcts in cerebellum, severe chronic R ankle osteoarthritis (as previously established). Patient does describe pattern of difficulty with mobility consistent with aspects of spinal stenosis with neurogenic claudication but he's excellent lumbar extension and report of improvement after repeated extensions decreases the likelihood of this condition in light of the other contributing factors. He does have positive SLR bilaterally, which is easily  negative in spinal stenosis with neurogenic claudication due to the position of the spine during testing, but can be positive in other types of sciatica. Of note, patient reported the symptoms the SLR reproduced were not consistent with his concordant sign. The most likely strongest contributing factor to patient's chief complaint of feeling of numbness and weakness with prolonged weight bearing is severe sensory neuropathy. His neurologist is working with him to wean from Dilantin that has been identified by his medical team as likely contributor to his neuropathy, but it is unclear to this PT if he has potential for improvement once off of it or if it will only decrease/halt the speed of progression of neuropathy.  Patient was again educated that physical therapy has limited effects on improving unpleasant sensations or numbness from peripheral neuropathy, but can have some effect on improving strength, balance, and function. Physical therapy can also be effective in improving low back discomfort and strength. Patient would benefit  from a bout of physical therapy to help address functional, balance, and lumbar stability/strength deficits he complains of and demonstrates during session..Patient presents with significant pain, paresthesia, muscle performance (strength/power/endurance), ataxia, coordination, joint stiffness, sensory, proprioceptive, knowledge, balance, gait, and activity tolerance  impairments that are limiting ability to complete usual activities including balance, walking, sleeping, difficulty with getting up and moving around, household and community mobility, going out in the community, housework, gardening, going to the store without difficulty. Patient will benefit from skilled physical therapy intervention to address current body structure impairments and activity limitations to improve function and work towards goals set in current POC in order to return to prior level of function or maximal functional improvement.    OBJECTIVE IMPAIRMENTS: Abnormal gait, decreased activity tolerance, decreased balance, decreased coordination, decreased endurance, decreased knowledge of condition, decreased mobility, difficulty walking, decreased ROM, decreased strength, hypomobility, impaired perceived functional ability, impaired sensation, and pain.    ACTIVITY LIMITATIONS: carrying, lifting, sitting, standing, squatting, sleeping, stairs, transfers, dressing, and locomotion level   PARTICIPATION LIMITATIONS: meal prep, cleaning, interpersonal relationship, shopping, community activity, yard work, and   difficulty with balance, walking, sleeping, difficulty with getting up and moving around, household and community mobility, going out in the community, housework, gardening, going to the store   PERSONAL FACTORS: Behavior pattern, Past/current experiences, Time since onset of injury/illness/exacerbation, and 3+ comorbidities:   CVA on 06/20/2022 (MRI showed punctate acute/subacute nonhemorrhagic infarct in the posterior left lower  midbrain near the 4th cranial nerve nucleus with remote lacunar infarcts of the right thalamus and right cerebellum),  4mm PCOM aneurysm (found incidentally on CTA 06/20/2022, currently undergoing conservative monitoring), concern over dilantin related neuropathy (starting to wean off), systolic murmur, benign prostatic hyperplasia with urinary obstruction (followed by urology), chronic R ankle pain (likely due to post traumatic arthritis), seizure disorder (controlled over many years with dilantin), low back pain, ACDR for myelopathy at C3-C4, 05/2018), cataract surgeries (bilateral), right Carpal tunnel release (06/2019), symptoms of  peripheral neuropathy at all limbs (fine motor skills in B UE affected), perfuse degenerative changes on lumbar and cervical spine imaging (see chart for details), chronic kidney disease (concern over it being related to dilantin, followed by nephrology). are also affecting patient's functional outcome.    REHAB POTENTIAL: Fair due to likelihood of neuropathy being the root cause of his chief complaint.   CLINICAL DECISION MAKING: Evolving/moderate complexity   EVALUATION COMPLEXITY: Moderate     GOALS: Goals reviewed with patient? No   SHORT TERM GOALS: Target date: 04/01/2023   Patient will be independent with initial home exercise program for self-management of symptoms. Baseline: Initial HEP to be provided at visit 2 as appropriate (03/18/23); Goal status: MET     LONG TERM GOALS: Target date: 04/29/2023. Target date updated to 05/21/2023 for all unmet goals on 04/29/2023.   Patient will be independent with a long-term home exercise program for self-management of symptoms.  Baseline: Initial HEP to be provided at visit 2 as appropriate (03/18/23); participating regularly (04/29/2023); Goal status: In-progress   2.  Patient will demonstrate improved FOTO by equal or greater than 10 points by visit #12 to demonstrate improvement in overall condition and self-reported  functional ability.  Baseline: emailed to patient (03/18/23); 77 at visit #10 (04/29/2023);  Goal status: In-progress   3.  Patient will score equal or greater than 25/30 on Functional Gait Assessment to demonstrate low fall risk.  Baseline: to be tested at visit 2 as appropriate (03/18/23); 20/30 moderate fall risk (03/22/2023); 22/30 moderate fall risk (04/29/2023);  Goal status: In-progress   4.  Patient will ambulate equal or greater than 1370 feet on 6 Minute Walk Test with LRAD to equal norm for community dwelling older adults between ages 1-89 years and improve his community mobility and ability to shop at big box stores.  Baseline: to be tested visit 2 as appropriate (03/18/23); 1170 feet (03/22/2023); 1451 feet while holding SPC, mild antalgic gait favoring R LE.  Reports pain in right ankle by end (04/29/2023);  Goal status: MET   5.  Patient will score equal or greater than Level 2 on [place]ann Core Stability Test to demonstrate improved core strength and motor control to decrease feeling of weakness in the back and improve ability to ambulate in the community.  Baseline: To be tested at future date as appropriate (03/18/23); Level 1 (03/22/2023); level 3 (04/29/2023);  Goal status: MET       PLAN:   PT FREQUENCY: 1-2x/week   PT DURATION: 3 weeks   PLANNED INTERVENTIONS: Therapeutic exercises, Therapeutic activity, Neuromuscular re-education, Balance training, Gait training, Patient/Family education, Self Care, Joint mobilization, Stair training, DME instructions, Dry Needling, Electrical stimulation, Spinal mobilization, Cryotherapy, Moist heat, Manual therapy, and Re-evaluation.   PLAN FOR NEXT SESSION: Progressive core/LE/functional strengthening and balance exercises; neurodynamic exercises and manual therapy as needed.    Cira Rue, PT, DPT 05/06/2023, 12:15 PM  [facility] [address] [place], [place] [address] P: [phone] I F:  [phone]

## 2023-05-11 ENCOUNTER — Encounter: Payer: Self-pay | Admitting: Physical Therapy

## 2023-05-11 ENCOUNTER — Ambulatory Visit: Payer: Medicare Other | Admitting: Physical Therapy

## 2023-05-11 DIAGNOSIS — R2689 Other abnormalities of gait and mobility: Secondary | ICD-10-CM

## 2023-05-11 DIAGNOSIS — R208 Other disturbances of skin sensation: Secondary | ICD-10-CM

## 2023-05-11 DIAGNOSIS — M5459 Other low back pain: Secondary | ICD-10-CM | POA: Diagnosis not present

## 2023-05-11 DIAGNOSIS — M6281 Muscle weakness (generalized): Secondary | ICD-10-CM

## 2023-05-11 NOTE — Therapy (Signed)
OUTPATIENT PHYSICAL THERAPY TREATMENT NOTE   Patient Name: Mark Holmes MRN: [id number] DOB:07-02-1935, 88 y.o., male Today's Date: 05/11/2023  PCP: Eustaquio Boyden, MD  REFERRING PROVIDER: Eustaquio Boyden, MD; also recommended by Cristopher Peru, MD (Neurology)   END OF SESSION:   PT End of Session - 05/11/23 1310     Visit Number 13    Number of Visits 16    Date for PT Re-Evaluation 05/21/23    Authorization Type [employer] MEDICARE reporting period from 03/18/2023    Progress Note Due on Visit 20    PT Start Time 1305    PT Stop Time 1353    PT Time Calculation (min) 48 min    Activity Tolerance Patient tolerated treatment well;Patient limited by fatigue    Behavior During Therapy [facility] for tasks assessed/performed                    Past Medical History:  Diagnosis Date   Benign prostatic hypertrophy with elevated PSA    per Dr. Achilles Dunk   History of CT scan of brain 1992   normal   Hyperglycemia 01/2006   102   Hyperlipemia    Hypertension    Seizure disorder [facility])    Past Surgical History:  Procedure Laterality Date   ANTERIOR CERVICAL DECOMP/DISCECTOMY FUSION  05/2018   for myelopathy -C3/4 ([facility])   CARPAL TUNNEL RELEASE Right 06/2019   ([facility])   CATARACT EXTRACTION W/ INTRAOCULAR LENS IMPLANT Right 2012   [facility]   CATARACT EXTRACTION W/PHACO Left 01/14/2022   Procedure: CATARACT EXTRACTION PHACO AND INTRAOCULAR LENS PLACEMENT (IOC) LEFT;  Surgeon: Lockie Mola, MD;  Location: [facility];  Service: Ophthalmology;  Laterality: Left;  7.55 01:03.6   History of EEG  1985   normal   hospitalization  1981   observation for seizuare   TONSILLECTOMY     TRANSURETHRAL RESECTION OF PROSTATE  10/2005   [facility]   TRANSURETHRAL RESECTION OF PROSTATE  08/2017   (rpt) [facility]   Patient Active Problem List   Diagnosis Date Noted   Spinal stenosis of lumbar region with neurogenic claudication 03/04/2023    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   Bilateral hearing loss 06/30/2022   Brain aneurysm 06/30/2022   History of ischemic stroke 06/20/2022   Nephrotic range proteinuria 06/04/2022   Hypoproteinemia (HCC) 06/01/2022   Atypical chest pain 07/10/2020   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   Unsteadiness on feet 11/08/2019   History of spinal surgery 01/17/2019   Neck pain 01/17/2019   Weakness of hand 01/17/2019   BPH (benign prostatic hyperplasia) 01/17/2019   Systolic murmur 07/04/2018   Right carpal tunnel syndrome 02/02/2018   Ulnar neuropathy at elbow, left 02/02/2018   Peripheral neuropathy 01/26/2018   Bilateral buttock pain 06/29/2016   Failed vision screen 06/29/2016   Advanced care planning/counseling discussion 06/21/2015   Medicare annual wellness visit, subsequent 06/18/2014   Health maintenance examination 06/18/2014   Chronic ankle pain 06/18/2014   Incomplete emptying of bladder 06/07/2013   Vitamin D deficiency 05/20/2013   THYROID NODULE 05/17/2007   Mixed hyperlipidemia 05/17/2007   Essential tremor 05/17/2007   Essential hypertension 05/17/2007   Cervical spondylosis with myelopathy and radiculopathy 05/17/2007   Seizure disorder (HCC) 05/17/2007    REFERRING DIAG: unsteadiness on feet, cervical spondylosis with myelopathy and radiculopathy, spinal stenosis of lumbar region with neurogenic claudication   THERAPY DIAG:  Other low back pain  Other disturbances of skin sensation  Other abnormalities  of gait and mobility  Muscle weakness (generalized)  Rationale for Evaluation and Treatment Rehabilitation  PERTINENT HISTORY: Patient is a 88 y.o. male who presents to outpatient physical therapy with a referral for medical diagnosis unsteadiness on feet, cervical spondylosis with myelopathy and radiculopathy, spinal stenosis of lumbar region with neurogenic claudication. This patient's chief complaints consist of burning and numbness in B lower legs as well as  feeling of weakness in low back and lower legs leading to the following functional deficits: difficulty with balance, walking, sleeping, difficulty with getting up and moving around, household and community mobility, going out in the community, housework, gardening, going to the store.. 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  Patient denies hx of cancer, lung problems, heart problems, diabetes, unexplained weight loss, unexplained changes in bowel or bladder problems, and osteoporosis.   PRECAUTIONS: fall  SUBJECTIVE:                                                                                                                                                                                      SUBJECTIVE STATEMENT:  Patient arrives with [facility] and states he is feeling fine. He would like to try the glute thrust with the 15# ball today.  Patient is on the last 3 weeks of his dilantin taper. He saw the nephrologist  today who said his blood work was much better and he thinks it is because he is tapering off the dilantin. He states he still feels pain in his right forearm (not so much his shoulder).    PAIN:  NPRS 0/10 except stiffness in his feet.    OBJECTIVE   TODAY'S TREATMENT:    Therapeutic exercise: to centralize symptoms and improve ROM, strength, muscular endurance, and activity tolerance required for successful completion of functional activities.  - backwards walking on TM, 1x5 min  with SBA and B UE support to improve gait stability. 1.43mph for 5 min.  - sled push with hands on top of vertical poles, 12x25 feet, loaded with 90# (pt reports he feels it strongly in his calves and breathing effort).  - reverse lunge 2x10 each side, progressing to holding PVC stick for support instead of TM bar. Discomfort on R shoulder by end of second set. Patient working on lifting his legs sufficiently to prevent tripping over the a2z pad placed under knee.  - seated glute thrust with back on 17 inch chair covered with towel, 1x10 AROM. Doubled up mat under buttocks.  - seated glute thrust with back on 17 inch chair covered with towel, 3x10 with 15# med ball over abdomen. Doubled up mat under buttocks. Therapist toes under patient toes to encourage weight through heels.  - seated AAROM R shoulder flexion and abduction with pulleys, 1x5-8 reps each direction.  - standing AAROM R shoulder flexion wall slide, 1x5 - standing AAROM R shoulder abduction wall slide, 1x10 with 5 second holds - Education on HEP including handout   Pt required multimodal cuing for proper technique and to facilitate improved neuromuscular control, strength, range of motion, and functional ability resulting in improved performance and form.     PATIENT EDUCATION:  Education details: Education on diagnosis, prognosis, POC, anatomy and physiology of current condition.  03/22/2023: Educated in PT request to not talk about PT's religious  beliefs and not touching PT unnecessarily per PT preference in a professional setting. Patient states he understands and agrees.  Reviewed cancelation/no-show policy with patient and confirmed patient has correct phone number for clinic; patient verbalized understanding (03/24/23).  Person educated: Patient Education method: Explanation Education comprehension: verbalized understanding and needs further education   HOME EXERCISE PROGRAM: Access Code: [email or web address] URL: [email or web address] Date: 05/11/2023 Prepared by: Norton Blizzard  Exercises - The Hundred 3 Intermediate - Table Top  - 1 x daily - 10 reps - 30 seconds hold - Bird Dog with Resistance  - 1 x daily - 2-3 sets - 10 reps - Child's Pose Stretch  - 1 x daily - 3 reps - 20 seconds hold - Bear Walk  - 1 x daily - 3 sets - 5 reps - Bird Dog with Knee Taps  - 3 x weekly - 3 sets - 10 reps - Standing Single Shoulder Flexion Wall Slide with Palm Up  - 1 x daily - 2 sets - 10 reps - 5 seconds hold - Standing Shoulder Abduction Slides at Wall  - 1 x daily - 2 sets - 20 reps - 5 seconds hold  HOME EXERCISE PROGRAM [[email or web address]] View at "[email or web address]" using code: [email or web address] Modified [place] -  Repeat 10 Repetitions, Complete 2 Sets, Perform 3 Times a Week  HOME EXERCISE PROGRAM [[id number]] View at "[email or web address]" using code: [email or web address]  [place] Get Up part 2 -  Repeat 10 Repetitions, Complete 2 Sets, Perform 3 Times a Week  [place] Get Up part 1 -  Repeat 10 Repetitions, Complete 2 Sets, Perform 3 Times a Week  [place] Get Up part 4 -  Repeat 10 Repetitions, Complete 2 Sets, Perform 3 Times a Week  [place] Get Up Modified part 5 -  Repeat 10 Repetitions, Complete 2 Sets, Perform 3 Times a Week  HOME EXERCISE PROGRAM [[id number]] View at "[email or web address]" using code: [email or web address] DIPS IN CHAIR -  Repeat 3 Repetitions, Hold 1 Second(s), Complete 3 Sets, Perform 3 Times a Week   ASSESSMENT:   CLINICAL  IMPRESSION: Patient arrives reporting continued pain in the R UE but good enthusiasm about continuing exercise to improve his balance and strength. Today's session continued with exercises targeting improved gait stability, balance, and functional strength. Patient tolerated it well except some pain in his right ankle and R UE that required modification of exercises to accommodate. He had trouble with stabilizing his back lunge with the right UE due to R UE pain. Patient was shown some AAROM exercises to preserve R shoulder overhead mobility. Patient would benefit from continued management of limiting condition by skilled physical therapist to address remaining impairments and functional limitations to work towards stated goals and return to PLOF or maximal functional independence.   From Initial PT Evaluation 03/18/2023:  Patient is a 88 y.o. male referred to outpatient physical therapy with a medical diagnosis of unsteadiness on feet, cervical spondylosis with myelopathy and radiculopathy, spinal stenosis of lumbar region with neurogenic claudication who presents with signs and symptoms consistent with severe sensory B LE neuropathy, with ataxia and imbalance related to multifactorial contributors such as previously identified and surgically addressed cervical myelopathy, evidence of prior lacunar infarcts in cerebellum, severe chronic R ankle osteoarthritis (as previously established). Patient does describe pattern of difficulty with mobility consistent with aspects of spinal stenosis with neurogenic claudication but he's excellent lumbar extension and report of improvement after repeated extensions decreases the likelihood of this condition in light of the other contributing factors. He does have positive SLR bilaterally, which is easily  negative in spinal stenosis with neurogenic claudication due to the position of the spine during testing, but can be positive in other types of sciatica. Of note, patient  reported the symptoms the SLR reproduced were not consistent with his concordant sign. The most likely strongest contributing factor to patient's chief complaint of feeling of numbness and weakness with prolonged weight bearing is severe sensory neuropathy. His neurologist is working with him to wean from Dilantin that has been identified by his medical team as likely contributor to his neuropathy, but it is unclear to this PT if he has potential for improvement once off of it or if it will only decrease/halt the speed of progression of neuropathy.  Patient was again educated that physical therapy has limited effects on improving unpleasant sensations or numbness from peripheral neuropathy, but can have some effect on improving strength, balance, and function. Physical therapy can also be effective in improving low back discomfort and strength. Patient would benefit from a bout of physical therapy to help address functional, balance, and lumbar stability/strength deficits he complains of and demonstrates during session..Patient presents with significant pain, paresthesia, muscle performance (strength/power/endurance), ataxia, coordination, joint stiffness, sensory, proprioceptive, knowledge, balance, gait, and activity tolerance  impairments that are limiting ability to complete usual activities including balance, walking, sleeping, difficulty with getting up and moving around, household and community mobility, going out in the community, housework, gardening, going to the store without difficulty. Patient will benefit from skilled physical therapy intervention to address current body structure impairments and activity limitations to improve function and work towards goals set in current POC in order to return to prior level of function or maximal functional improvement.    OBJECTIVE IMPAIRMENTS: Abnormal gait, decreased activity tolerance, decreased balance, decreased coordination, decreased endurance, decreased  knowledge of condition, decreased mobility, difficulty walking, decreased ROM, decreased strength, hypomobility, impaired perceived functional ability, impaired sensation, and pain.    ACTIVITY LIMITATIONS: carrying, lifting, sitting, standing, squatting, sleeping, stairs, transfers, dressing, and locomotion level  PARTICIPATION LIMITATIONS: meal prep, cleaning, interpersonal relationship, shopping, community activity, yard work, and   difficulty with balance, walking, sleeping, difficulty with getting up and moving around, household and community mobility, going out in the community, housework, gardening, going to the store   PERSONAL FACTORS: Behavior pattern, Past/current experiences, Time since onset of injury/illness/exacerbation, and 3+ comorbidities:   CVA on 06/20/2022 (MRI showed punctate acute/subacute nonhemorrhagic infarct in the posterior left lower midbrain near the 4th cranial nerve nucleus with remote lacunar infarcts of the right thalamus and right cerebellum),  4mm PCOM aneurysm (found incidentally on CTA 06/20/2022, currently undergoing conservative monitoring), concern over dilantin related neuropathy (starting to wean off), systolic murmur, benign prostatic hyperplasia with urinary obstruction (followed by urology), chronic R ankle pain (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 are also affecting patient's functional outcome.    REHAB POTENTIAL: Fair due to likelihood of neuropathy being the root cause of his chief complaint.   CLINICAL  DECISION MAKING: Evolving/moderate complexity   EVALUATION COMPLEXITY: Moderate     GOALS: Goals reviewed with patient? No   SHORT TERM GOALS: Target date: 04/01/2023   Patient will be independent with initial home exercise program for self-management of symptoms. Baseline: Initial HEP to be provided at visit 2 as appropriate (03/18/23); Goal status: MET     LONG TERM GOALS: Target date: 04/29/2023. Target date updated to 05/21/2023 for all unmet goals on 04/29/2023.   Patient will be independent with a long-term home exercise program for self-management of symptoms.  Baseline: Initial HEP to be provided at visit 2 as appropriate (03/18/23); participating regularly (04/29/2023); Goal status: In-progress   2.  Patient will demonstrate improved FOTO by equal or greater than 10 points by visit #12 to demonstrate improvement in overall condition and self-reported functional ability.  Baseline: emailed to patient (03/18/23); 77 at visit #10 (04/29/2023);  Goal status: In-progress   3.  Patient will score equal or greater than 25/30 on Functional Gait Assessment to demonstrate low fall risk.  Baseline: to be tested at visit 2 as appropriate (03/18/23); 20/30 moderate fall risk (03/22/2023); 22/30 moderate fall risk (04/29/2023);  Goal status: In-progress   4.  Patient will ambulate equal or greater than 1370 feet on 6 Minute Walk Test with LRAD to equal norm for community dwelling older adults between ages 6-89 years and improve his community mobility and ability to shop at big box stores.  Baseline: to be tested visit 2 as appropriate (03/18/23); 1170 feet (03/22/2023); 1451 feet while holding SPC, mild antalgic gait favoring R LE.  Reports pain in right ankle by end (04/29/2023);  Goal status: MET   5.  Patient will score equal or greater than Level 2 on [place]ann Core Stability Test to demonstrate improved core strength and motor control to decrease feeling of weakness in the back and improve ability to  ambulate in the community.  Baseline: To be tested at future date as appropriate (03/18/23); Level 1 (03/22/2023); level 3 (04/29/2023);  Goal status: MET       PLAN:   PT FREQUENCY: 1-2x/week   PT DURATION: 3 weeks   PLANNED INTERVENTIONS:  Therapeutic exercises, Therapeutic activity, Neuromuscular re-education, Balance training, Gait training, Patient/Family education, Self Care, Joint mobilization, Stair training, DME instructions, Dry Needling, Electrical stimulation, Spinal mobilization, Cryotherapy, Moist heat, Manual therapy, and Re-evaluation.   PLAN FOR NEXT SESSION: Progressive core/LE/functional strengthening and balance exercises; neurodynamic exercises and manual therapy as needed.    Cira Rue, PT, DPT 05/11/2023, 2:06 PM  [facility] [address] [place], [place] [address] P: [phone] I F: [phone]

## 2023-05-13 ENCOUNTER — Encounter: Payer: Self-pay | Admitting: Physical Therapy

## 2023-05-13 ENCOUNTER — Ambulatory Visit: Payer: Medicare Other | Admitting: Physical Therapy

## 2023-05-13 DIAGNOSIS — M6281 Muscle weakness (generalized): Secondary | ICD-10-CM

## 2023-05-13 DIAGNOSIS — M5459 Other low back pain: Secondary | ICD-10-CM

## 2023-05-13 DIAGNOSIS — R2689 Other abnormalities of gait and mobility: Secondary | ICD-10-CM

## 2023-05-13 DIAGNOSIS — R208 Other disturbances of skin sensation: Secondary | ICD-10-CM

## 2023-05-13 NOTE — Therapy (Signed)
OUTPATIENT PHYSICAL THERAPY TREATMENT NOTE   Patient Name: Mark Holmes MRN: [id number] DOB:1935/10/05, 88 y.o., male Today's Date: 05/13/2023  PCP: Eustaquio Boyden, MD  REFERRING PROVIDER: Eustaquio Boyden, MD; also recommended by Cristopher Peru, MD (Neurology)   END OF SESSION:   PT End of Session - 05/13/23 1324     Visit Number 14    Number of Visits 16    Date for PT Re-Evaluation 05/21/23    Authorization Type [employer] MEDICARE reporting period from 03/18/2023    Progress Note Due on Visit 20    PT Start Time 1305    PT Stop Time 1343    PT Time Calculation (min) 38 min    Activity Tolerance Patient tolerated treatment well;Patient limited by fatigue    Behavior During Therapy [facility] for tasks assessed/performed                     Past Medical History:  Diagnosis Date   Benign prostatic hypertrophy with elevated PSA    per Dr. Achilles Dunk   History of CT scan of brain 1992   normal   Hyperglycemia 01/2006   102   Hyperlipemia    Hypertension    Seizure disorder [facility])    Past Surgical History:  Procedure Laterality Date   ANTERIOR CERVICAL DECOMP/DISCECTOMY FUSION  05/2018   for myelopathy -C3/4 ([facility])   CARPAL TUNNEL RELEASE Right 06/2019   ([facility])   CATARACT EXTRACTION W/ INTRAOCULAR LENS IMPLANT Right 2012   [facility]   CATARACT EXTRACTION W/PHACO Left 01/14/2022   Procedure: CATARACT EXTRACTION PHACO AND INTRAOCULAR LENS PLACEMENT (IOC) LEFT;  Surgeon: Lockie Mola, MD;  Location: [facility];  Service: Ophthalmology;  Laterality: Left;  7.55 01:03.6   History of EEG  1985   normal   hospitalization  1981   observation for seizuare   TONSILLECTOMY     TRANSURETHRAL RESECTION OF PROSTATE  10/2005   [facility]   TRANSURETHRAL RESECTION OF PROSTATE  08/2017   (rpt) [facility]   Patient Active Problem List   Diagnosis Date Noted   Spinal stenosis of lumbar region with neurogenic claudication 03/04/2023    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   Bilateral hearing loss 06/30/2022   Brain aneurysm 06/30/2022   History of ischemic stroke 06/20/2022   Nephrotic range proteinuria 06/04/2022   Hypoproteinemia (HCC) 06/01/2022   Atypical chest pain 07/10/2020   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   Unsteadiness on feet 11/08/2019   History of spinal surgery 01/17/2019   Neck pain 01/17/2019   Weakness of hand 01/17/2019   BPH (benign prostatic hyperplasia) 01/17/2019   Systolic murmur 07/04/2018   Right carpal tunnel syndrome 02/02/2018   Ulnar neuropathy at elbow, left 02/02/2018   Peripheral neuropathy 01/26/2018   Bilateral buttock pain 06/29/2016   Failed vision screen 06/29/2016   Advanced care planning/counseling discussion 06/21/2015   Medicare annual wellness visit, subsequent 06/18/2014   Health maintenance examination 06/18/2014   Chronic ankle pain 06/18/2014   Incomplete emptying of bladder 06/07/2013   Vitamin D deficiency 05/20/2013   THYROID NODULE 05/17/2007   Mixed hyperlipidemia 05/17/2007   Essential tremor 05/17/2007   Essential hypertension 05/17/2007   Cervical spondylosis with myelopathy and radiculopathy 05/17/2007   Seizure disorder (HCC) 05/17/2007    REFERRING DIAG: unsteadiness on feet, cervical spondylosis with myelopathy and radiculopathy, spinal stenosis of lumbar region with neurogenic claudication   THERAPY DIAG:  Other low back pain  Other disturbances of skin sensation  Other  abnormalities of gait and mobility  Muscle weakness (generalized)  Rationale for Evaluation and Treatment Rehabilitation  PERTINENT HISTORY: Patient is a 88 y.o. male who presents to outpatient physical therapy with a referral for medical diagnosis unsteadiness on feet, cervical spondylosis with myelopathy and radiculopathy, spinal stenosis of lumbar region with neurogenic claudication. This patient's chief complaints consist of burning and numbness in B lower legs as well as  feeling of weakness in low back and lower legs leading to the following functional deficits: difficulty with balance, walking, sleeping, difficulty with getting up and moving around, household and community mobility, going out in the community, housework, gardening, going to the store.. 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  Patient denies hx of cancer, lung problems, heart problems, diabetes, unexplained weight loss, unexplained changes in bowel or bladder problems, and osteoporosis.   PRECAUTIONS: fall  SUBJECTIVE:                                                                                                                                                                                      SUBJECTIVE STATEMENT:  Patient arrives with [facility] and states his shoulder is getting better. He can lift his hand up to turn on the lamp now.     PAIN:  NPRS 0/10 except stiffness in his feet.     OBJECTIVE   TODAY'S TREATMENT:    Therapeutic exercise: to centralize symptoms and improve ROM, strength, muscular endurance, and activity tolerance required for successful completion of functional activities.  - backwards walking on TM, 1x5 min with SBA and B UE support to improve gait stability. 1.3mph for 5 min.  - sled push with hands on top of vertical poles, 6x25 feet, loaded with 90# (heavier too hard on R ankle).  - standing single arm row with trunk twist, 3x15 each side at 10#.  -  sled push with hands on top of vertical poles, 6x25 feet, loaded with 90# (strong fatigue).  - seated knee extension at [facility] machine, seated position 2, B LE, 1x10 at 55#, single leg: R 3x10 at 22.05/10/19# (poor ROM last few reps), L 2x10 at 22.5/25#.    Pt required multimodal cuing for proper technique and to facilitate improved neuromuscular control, strength, range of motion, and functional ability resulting in improved performance and form.     PATIENT EDUCATION:  Education details: Education on diagnosis, prognosis, POC, anatomy and physiology of current condition.  03/22/2023: Educated in PT request to not talk about PT's religious beliefs and not touching PT unnecessarily per PT preference in a professional setting. Patient states he understands and agrees.  Reviewed cancelation/no-show policy with patient and confirmed patient has correct phone number for clinic; patient verbalized understanding (03/24/23).  Person educated: Patient Education method: Explanation Education comprehension: verbalized understanding and needs further education   HOME EXERCISE PROGRAM: Access Code: [email or web address] URL: [email or web address] Date: 05/11/2023 Prepared by: Norton Blizzard  Exercises - The Hundred 3 Intermediate - Table Top  - 1 x daily - 10 reps - 30 seconds hold - Bird Dog with Resistance  - 1 x daily - 2-3 sets - 10 reps - Child's Pose Stretch  - 1 x daily - 3 reps - 20 seconds hold -  Bear Walk  - 1 x daily - 3 sets - 5 reps - Bird Dog with Knee Taps  - 3 x weekly - 3 sets - 10 reps - Standing Single Shoulder Flexion Wall Slide with Palm Up  - 1 x daily - 2 sets - 10 reps - 5 seconds hold - Standing Shoulder Abduction Slides at Wall  - 1 x daily - 2 sets - 20 reps - 5 seconds hold  HOME EXERCISE PROGRAM [[email or web address]] View at "[email or web address]" using code: [email or web address] Modified [place] -  Repeat 10 Repetitions, Complete 2 Sets, Perform 3 Times a Week  HOME EXERCISE PROGRAM [[id number]] View at "[email or web address]" using code: [email or web address]  [place] Get Up part 2 -  Repeat 10 Repetitions, Complete 2 Sets, Perform 3 Times a Week  [place] Get Up part 1 -  Repeat 10 Repetitions, Complete 2 Sets, Perform 3 Times a Week  [place] Get Up part 4 -  Repeat 10 Repetitions, Complete 2 Sets, Perform 3 Times a Week  [place] Get Up Modified part 5 -  Repeat 10 Repetitions, Complete 2 Sets, Perform 3 Times a Week  HOME EXERCISE PROGRAM [[id number]] View at "[email or web address]" using code: [email or web address] DIPS IN CHAIR -  Repeat 3 Repetitions, Hold 1 Second(s), Complete 3 Sets, Perform 3 Times a Week   ASSESSMENT:   CLINICAL IMPRESSION: Patient arrives reporting improved R shoulder pain and good recovery from previous session. Patient was appropriately challenged and tolerated treatment well. Patient would benefit from continued management of limiting condition by skilled physical therapist to address remaining impairments and functional limitations to work towards stated goals and return to PLOF or maximal functional independence.    From Initial PT Evaluation 03/18/2023:  Patient is a 88 y.o. male referred to outpatient physical therapy with a medical diagnosis of unsteadiness on feet, cervical spondylosis with myelopathy and radiculopathy, spinal stenosis of lumbar region with neurogenic claudication who presents with signs and symptoms consistent with severe sensory B LE neuropathy, with ataxia and  imbalance related to multifactorial contributors such as previously identified and surgically addressed cervical myelopathy, evidence of prior lacunar infarcts in cerebellum, severe chronic R ankle osteoarthritis (  as previously established). Patient does describe pattern of difficulty with mobility consistent with aspects of spinal stenosis with neurogenic claudication but he's excellent lumbar extension and report of improvement after repeated extensions decreases the likelihood of this condition in light of the other contributing factors. He does have positive SLR bilaterally, which is easily  negative in spinal stenosis with neurogenic claudication due to the position of the spine during testing, but can be positive in other types of sciatica. Of note, patient reported the symptoms the SLR reproduced were not consistent with his concordant sign. The most likely strongest contributing factor to patient's chief complaint of feeling of numbness and weakness with prolonged weight bearing is severe sensory neuropathy. His neurologist is working with him to wean from Dilantin that has been identified by his medical team as likely contributor to his neuropathy, but it is unclear to this PT if he has potential for improvement once off of it or if it will only decrease/halt the speed of progression of neuropathy.  Patient was again educated that physical therapy has limited effects on improving unpleasant sensations or numbness from peripheral neuropathy, but can have some effect on improving strength, balance, and function. Physical therapy can also be effective in improving low back discomfort and strength. Patient would benefit from a bout of physical therapy to help address functional, balance, and lumbar stability/strength deficits he complains of and demonstrates during session..Patient presents with significant pain, paresthesia, muscle performance (strength/power/endurance), ataxia, coordination, joint stiffness,  sensory, proprioceptive, knowledge, balance, gait, and activity tolerance  impairments that are limiting ability to complete usual activities including balance, walking, sleeping, difficulty with getting up and moving around, household and community mobility, going out in the community, housework, gardening, going to the store without difficulty. Patient will benefit from skilled physical therapy intervention to address current body structure impairments and activity limitations to improve function and work towards goals set in current POC in order to return to prior level of function or maximal functional improvement.    OBJECTIVE IMPAIRMENTS: Abnormal gait, decreased activity tolerance, decreased balance, decreased coordination, decreased endurance, decreased knowledge of condition, decreased mobility, difficulty walking, decreased ROM, decreased strength, hypomobility, impaired perceived functional ability, impaired sensation, and pain.    ACTIVITY LIMITATIONS: carrying, lifting, sitting, standing, squatting, sleeping, stairs, transfers, dressing, and locomotion level   PARTICIPATION LIMITATIONS: meal prep, cleaning, interpersonal relationship, shopping, community activity, yard work, and   difficulty with balance, walking, sleeping, difficulty with getting up and moving around, household and community mobility, going out in the community, housework, gardening, going to the store   PERSONAL FACTORS: Behavior pattern, Past/current experiences, Time since onset of injury/illness/exacerbation, and 3+ comorbidities:   CVA on 06/20/2022 (MRI showed punctate acute/subacute nonhemorrhagic infarct in the posterior left lower midbrain near the 4th cranial nerve nucleus with remote lacunar infarcts of the right thalamus and right cerebellum),  4mm PCOM aneurysm (found incidentally on CTA 06/20/2022, currently undergoing conservative monitoring), concern over dilantin related neuropathy (starting to wean off), systolic  murmur, benign prostatic hyperplasia with urinary obstruction (followed by urology), chronic R ankle pain (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 are  also affecting patient's functional outcome.    REHAB POTENTIAL: Fair due to likelihood of neuropathy being the root cause of his chief complaint.   CLINICAL DECISION MAKING: Evolving/moderate complexity   EVALUATION COMPLEXITY: Moderate     GOALS: Goals reviewed with patient? No   SHORT TERM GOALS: Target date: 04/01/2023   Patient will be independent with initial home exercise program for self-management of symptoms. Baseline: Initial HEP to be provided at visit 2 as appropriate (03/18/23); Goal status: MET     LONG TERM GOALS: Target date: 04/29/2023. Target date updated to 05/21/2023 for all unmet goals on 04/29/2023.   Patient will be independent with a long-term home exercise program for self-management of symptoms.  Baseline: Initial HEP to be provided at visit 2 as appropriate (03/18/23); participating regularly (04/29/2023); Goal status: In-progress   2.  Patient will demonstrate improved FOTO by equal or greater than 10 points by visit #12 to demonstrate improvement in overall condition and self-reported functional ability.  Baseline: emailed to patient (03/18/23); 77 at visit #10 (04/29/2023);  Goal status: In-progress   3.  Patient will score equal or greater than 25/30 on Functional Gait Assessment to demonstrate low fall risk.  Baseline: to be tested at visit 2 as appropriate (03/18/23);  20/30 moderate fall risk (03/22/2023); 22/30 moderate fall risk (04/29/2023);  Goal status: In-progress   4.  Patient will ambulate equal or greater than 1370 feet on 6 Minute Walk Test with LRAD to equal norm for community dwelling older adults between ages 45-89 years and improve his community mobility and ability to shop at big box stores.  Baseline: to be tested visit 2 as appropriate (03/18/23); 1170 feet (03/22/2023); 1451 feet while holding SPC, mild antalgic gait favoring R LE.  Reports pain in right ankle by end (04/29/2023);  Goal status: MET   5.  Patient will score equal or greater than Level 2 on [place]ann Core Stability Test to demonstrate improved core strength and motor control to decrease feeling of weakness in the back and improve ability to ambulate in the community.  Baseline: To be tested at future date as appropriate (03/18/23); Level 1 (03/22/2023); level 3 (04/29/2023);  Goal status: MET       PLAN:   PT FREQUENCY: 1-2x/week   PT DURATION: 3 weeks   PLANNED INTERVENTIONS: Therapeutic exercises, Therapeutic activity, Neuromuscular re-education, Balance training, Gait training, Patient/Family education, Self Care, Joint mobilization, Stair training, DME instructions, Dry Needling, Electrical stimulation, Spinal mobilization, Cryotherapy, Moist heat, Manual therapy, and Re-evaluation.   PLAN FOR NEXT SESSION: Progressive core/LE/functional strengthening and balance exercises; neurodynamic exercises and manual therapy as needed.    Cira Rue, PT, DPT 05/13/2023, 1:44 PM  [facility] [address] [place], [place] [address] P: [phone] I F: [phone]

## 2023-05-18 ENCOUNTER — Ambulatory Visit: Payer: Medicare Other | Admitting: Physical Therapy

## 2023-05-18 ENCOUNTER — Encounter: Payer: Self-pay | Admitting: Family Medicine

## 2023-05-18 ENCOUNTER — Encounter: Payer: Self-pay | Admitting: Physical Therapy

## 2023-05-18 DIAGNOSIS — M5459 Other low back pain: Secondary | ICD-10-CM

## 2023-05-18 DIAGNOSIS — R208 Other disturbances of skin sensation: Secondary | ICD-10-CM

## 2023-05-18 DIAGNOSIS — R2689 Other abnormalities of gait and mobility: Secondary | ICD-10-CM

## 2023-05-18 DIAGNOSIS — M6281 Muscle weakness (generalized): Secondary | ICD-10-CM

## 2023-05-18 NOTE — Therapy (Signed)
OUTPATIENT PHYSICAL THERAPY TREATMENT NOTE   Patient Name: Mark Holmes MRN: [id number] DOB:1935-11-25, 88 y.o., male Today's Date: 05/18/2023  PCP: Eustaquio Boyden, MD  REFERRING PROVIDER: Eustaquio Boyden, MD; also recommended by Cristopher Peru, MD (Neurology)   END OF SESSION:   PT End of Session - 05/18/23 1300     Visit Number 15    Number of Visits 16    Date for PT Re-Evaluation 05/21/23    Authorization Type [employer] MEDICARE reporting period from 03/18/2023    Progress Note Due on Visit 20    PT Start Time 1300    PT Stop Time 1340    PT Time Calculation (min) 40 min    Activity Tolerance Patient tolerated treatment well;Patient limited by fatigue    Behavior During Therapy [facility] for tasks assessed/performed                      Past Medical History:  Diagnosis Date   Benign prostatic hypertrophy with elevated PSA    per Dr. Achilles Dunk   History of CT scan of brain 1992   normal   Hyperglycemia 01/2006   102   Hyperlipemia    Hypertension    Seizure disorder [facility])    Past Surgical History:  Procedure Laterality Date   ANTERIOR CERVICAL DECOMP/DISCECTOMY FUSION  05/2018   for myelopathy -C3/4 ([facility])   CARPAL TUNNEL RELEASE Right 06/2019   ([facility])   CATARACT EXTRACTION W/ INTRAOCULAR LENS IMPLANT Right 2012   [facility]   CATARACT EXTRACTION W/PHACO Left 01/14/2022   Procedure: CATARACT EXTRACTION PHACO AND INTRAOCULAR LENS PLACEMENT (IOC) LEFT;  Surgeon: Lockie Mola, MD;  Location: [facility];  Service: Ophthalmology;  Laterality: Left;  7.55 01:03.6   History of EEG  1985   normal   hospitalization  1981   observation for seizuare   TONSILLECTOMY     TRANSURETHRAL RESECTION OF PROSTATE  10/2005   [facility]   TRANSURETHRAL RESECTION OF PROSTATE  08/2017   (rpt) [facility]   Patient Active Problem List   Diagnosis Date Noted   Spinal stenosis of lumbar region with neurogenic claudication 03/04/2023    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   Bilateral hearing loss 06/30/2022   Brain aneurysm 06/30/2022   History of ischemic stroke 06/20/2022   Nephrotic range proteinuria 06/04/2022   Hypoproteinemia (HCC) 06/01/2022   Atypical chest pain 07/10/2020   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   Unsteadiness on feet 11/08/2019   History of spinal surgery 01/17/2019   Neck pain 01/17/2019   Weakness of hand 01/17/2019   BPH (benign prostatic hyperplasia) 01/17/2019   Systolic murmur 07/04/2018   Right carpal tunnel syndrome 02/02/2018   Ulnar neuropathy at elbow, left 02/02/2018   Peripheral neuropathy 01/26/2018   Bilateral buttock pain 06/29/2016   Failed vision screen 06/29/2016   Advanced care planning/counseling discussion 06/21/2015   Medicare annual wellness visit, subsequent 06/18/2014   Health maintenance examination 06/18/2014   Chronic ankle pain 06/18/2014   Incomplete emptying of bladder 06/07/2013   Vitamin D deficiency 05/20/2013   THYROID NODULE 05/17/2007   Mixed hyperlipidemia 05/17/2007   Essential tremor 05/17/2007   Essential hypertension 05/17/2007   Cervical spondylosis with myelopathy and radiculopathy 05/17/2007   Seizure disorder (HCC) 05/17/2007    REFERRING DIAG: unsteadiness on feet, cervical spondylosis with myelopathy and radiculopathy, spinal stenosis of lumbar region with neurogenic claudication   THERAPY DIAG:  Other low back pain  Other disturbances of skin sensation  Other abnormalities of gait and mobility  Muscle weakness (generalized)  Rationale for Evaluation and Treatment Rehabilitation  PERTINENT HISTORY: Patient is a 88 y.o. male who presents to outpatient physical therapy with a referral for medical diagnosis unsteadiness on feet, cervical spondylosis with myelopathy and radiculopathy, spinal stenosis of lumbar region with neurogenic claudication. This patient's chief complaints consist of burning and numbness in B lower legs as well as  feeling of weakness in low back and lower legs leading to the following functional deficits: difficulty with balance, walking, sleeping, difficulty with getting up and moving around, household and community mobility, going out in the community, housework, gardening, going to the store.. 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  Patient denies hx of cancer, lung problems, heart problems, diabetes, unexplained weight loss, unexplained changes in bowel or bladder problems, and osteoporosis.   PRECAUTIONS: fall  SUBJECTIVE:                                                                                                                                                                                      SUBJECTIVE STATEMENT:  Patient arrives with [facility] and states his shoulder is still "there" but not only bothers him with certain positions. He wonders the PT's opinion on getting a lumbar MRI (last in 2021) as he states the  rheumatologist mentioned.    PAIN:  NPRS 0/10 except feeling of numbness and compression on lower legs.     OBJECTIVE  SELF-REPORTED FUNCTION FOTO score: 73/100 (NOC-Neuromuscular disorder questionnaire, last measured 05/18/2023)  TODAY'S TREATMENT:    Therapeutic exercise: to centralize symptoms and improve ROM, strength, muscular endurance, and activity tolerance required for successful completion of functional activities.  - backwards walking on TM, 1x5 min with SBA and B UE support to improve gait stability. 1.43mph for 5 min.  - sled push with  hands on top of vertical poles, 12x25 feet, loaded with 90# (heavier too hard on R ankle).  - standing single arm row with trunk twist, 3x10 each side at 15#.  - seated knee extension at [facility] machine, seated position 2, B LE, 1x10 at 55#, single leg: R 4x10 at 22.50#, L 3x10 at 25#.   - kickstand RDL, 3x5-10 with U UE support, 10#KB-15#slam ball in pillow case. (20#KB too heavy). Pause for instruction an explanation with youtube video.  - wall ball with sit <> stand in 17 inch chair, 1x5 with soccer bal (15#slam bal too heavy). Difficulty with eccentric control.   Pt required multimodal cuing for proper technique and to facilitate improved neuromuscular control, strength, range of motion, and functional ability resulting in improved performance and form.     PATIENT EDUCATION:  Education details: Education on diagnosis, prognosis, POC, anatomy and physiology of current condition.  03/22/2023: Educated in PT request to not talk about PT's religious beliefs and not touching PT unnecessarily per PT preference in a professional setting. Patient states he understands and agrees.  Reviewed cancelation/no-show policy with patient and confirmed patient has correct phone number for clinic; patient verbalized understanding (03/24/23).  Person educated: Patient Education method: Explanation Education comprehension: verbalized understanding and needs  further education   HOME EXERCISE PROGRAM: Access Code: [email or web address] URL: [email or web address] Date: 05/11/2023 Prepared by: Norton Blizzard  Exercises - The Hundred 3 Intermediate - Table Top  - 1 x daily - 10 reps - 30 seconds hold - Bird Dog with Resistance  - 1 x daily - 2-3 sets - 10 reps - Child's Pose Stretch  - 1 x daily - 3 reps - 20 seconds hold - Bear Walk  - 1 x daily - 3 sets - 5 reps - Bird Dog with Knee Taps  - 3 x weekly - 3 sets - 10 reps - Standing Single Shoulder Flexion Wall Slide with Palm Up  - 1 x daily - 2 sets - 10 reps - 5 seconds hold - Standing Shoulder Abduction Slides at Wall  - 1 x daily - 2 sets - 20 reps - 5 seconds hold  HOME EXERCISE PROGRAM [[email or web address]] View at "[email or web address]" using code: [email or web address] Modified [place] -  Repeat 10 Repetitions, Complete 2 Sets, Perform 3 Times a Week  HOME EXERCISE PROGRAM [[id number]] View at "[email or web address]" using code: [email or web address]  [place] Get Up part 2 -  Repeat 10 Repetitions, Complete 2 Sets, Perform 3 Times a Week  [place] Get Up part 1 -  Repeat 10 Repetitions, Complete 2 Sets, Perform 3 Times a Week  [place] Get Up part 4 -  Repeat 10 Repetitions, Complete 2 Sets, Perform 3 Times a Week  [place] Get Up Modified part 5 -  Repeat 10 Repetitions, Complete 2 Sets, Perform 3 Times a Week  HOME EXERCISE PROGRAM [[id number]] View at "[email or web address]" using code: [email or web address] DIPS IN CHAIR -  Repeat 3 Repetitions, Hold 1 Second(s), Complete 3 Sets, Perform 3 Times a Week   ASSESSMENT:   CLINICAL IMPRESSION: Patient arrives reporting continued difficulty with right shoulder and difficulty with B lower leg numbness/tightness. Continued with exercises for strength/motor control/balance/function. Patient continues to be appropriately challenged. He continues to demonstrate balance, motor control, and functional strength deficits but gives excellent effort. He does continue to have limitations from his right  shoulder and would likely benefit from a PT order for his right shoulder. FOTO score shows no improvement in self-reported function since visit #10. Plan to discharged from  current episode of care at next session. Patient would benefit from continued management of limiting condition by skilled physical therapist to address remaining impairments and functional limitations to work towards stated goals and return to PLOF or maximal functional independence.   From Initial PT Evaluation 03/18/2023:  Patient is a 88 y.o. male referred to outpatient physical therapy with a medical diagnosis of unsteadiness on feet, cervical spondylosis with myelopathy and radiculopathy, spinal stenosis of lumbar region with neurogenic claudication who presents with signs and symptoms consistent with severe sensory B LE neuropathy, with ataxia and imbalance related to multifactorial contributors such as previously identified and surgically addressed cervical myelopathy, evidence of prior lacunar infarcts in cerebellum, severe chronic R ankle osteoarthritis (as previously established). Patient does describe pattern of difficulty with mobility consistent with aspects of spinal stenosis with neurogenic claudication but he's excellent lumbar extension and report of improvement after repeated extensions decreases the likelihood of this condition in light of the other contributing factors. He does have positive SLR bilaterally, which is easily  negative in spinal stenosis with neurogenic claudication due to the position of the spine during testing, but can be positive in other types of sciatica. Of note, patient reported the symptoms the SLR reproduced were not consistent with his concordant sign. The most likely strongest contributing factor to patient's chief complaint of feeling of numbness and weakness with prolonged weight bearing is severe sensory neuropathy. His neurologist is working with him to wean from Dilantin that has been  identified by his medical team as likely contributor to his neuropathy, but it is unclear to this PT if he has potential for improvement once off of it or if it will only decrease/halt the speed of progression of neuropathy.  Patient was again educated that physical therapy has limited effects on improving unpleasant sensations or numbness from peripheral neuropathy, but can have some effect on improving strength, balance, and function. Physical therapy can also be effective in improving low back discomfort and strength. Patient would benefit from a bout of physical therapy to help address functional, balance, and lumbar stability/strength deficits he complains of and demonstrates during session..Patient presents with significant pain, paresthesia, muscle performance (strength/power/endurance), ataxia, coordination, joint stiffness, sensory, proprioceptive, knowledge, balance, gait, and activity tolerance  impairments that are limiting ability to complete usual activities including balance, walking, sleeping, difficulty with getting up and moving around, household and community mobility, going out in the community, housework, gardening, going to the store without difficulty. Patient will benefit from skilled physical therapy intervention to address current body structure impairments and activity limitations to improve function and work towards goals set in current POC in order to return to prior level of function or maximal functional improvement.    OBJECTIVE IMPAIRMENTS: Abnormal gait, decreased activity tolerance, decreased balance, decreased coordination, decreased endurance, decreased knowledge of condition, decreased mobility, difficulty walking, decreased ROM, decreased strength, hypomobility, impaired perceived functional ability, impaired sensation, and pain.    ACTIVITY LIMITATIONS: carrying, lifting, sitting, standing, squatting, sleeping, stairs, transfers, dressing, and locomotion level    PARTICIPATION LIMITATIONS: meal prep, cleaning, interpersonal relationship, shopping, community activity, yard work, and   difficulty with balance, walking, sleeping, difficulty with getting up and moving around, household and community mobility, going out in the community, housework, gardening, going to the store   PERSONAL FACTORS: Behavior pattern, Past/current experiences, Time since onset of injury/illness/exacerbation, and 3+ comorbidities:   CVA on 06/20/2022 (MRI showed punctate acute/subacute nonhemorrhagic infarct in the posterior left lower midbrain near the 4th  cranial nerve nucleus with remote lacunar infarcts of the right thalamus and right cerebellum),  4mm PCOM aneurysm (found incidentally on CTA 06/20/2022, currently undergoing conservative monitoring), concern over dilantin related neuropathy (starting to wean off), systolic murmur, benign prostatic hyperplasia with urinary obstruction (followed by urology), chronic R ankle pain (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 are also affecting patient's functional outcome.    REHAB POTENTIAL: Fair due to likelihood of neuropathy being the root cause of his chief complaint.   CLINICAL DECISION MAKING: Evolving/moderate complexity   EVALUATION COMPLEXITY: Moderate     GOALS: Goals reviewed with patient? No   SHORT TERM GOALS: Target date: 04/01/2023   Patient will be independent with initial home exercise program for self-management of symptoms. Baseline: Initial HEP to be provided at visit 2 as  appropriate (03/18/23); Goal status: MET     LONG TERM GOALS: Target date: 04/29/2023. Target date updated to 05/21/2023 for all unmet goals on 04/29/2023.   Patient will be independent with a long-term home exercise program for self-management of symptoms.  Baseline: Initial HEP to be provided at visit 2 as appropriate (03/18/23); participating regularly (04/29/2023); Goal status: In-progress   2.  Patient will demonstrate improved FOTO by equal or greater than 10 points by visit #12 to demonstrate improvement in overall condition and self-reported functional ability.  Baseline: emailed to patient (03/18/23); 77 at visit #10 (04/29/2023); 73 at visit #15 (05/18/2023);  Goal status: In-progress   3.  Patient will score equal or greater than 25/30 on Functional Gait Assessment to demonstrate low fall risk.  Baseline: to be tested at visit 2 as appropriate (03/18/23); 20/30 moderate fall risk (03/22/2023); 22/30 moderate fall risk (04/29/2023);  Goal status: In-progress   4.  Patient will ambulate equal or greater than 1370 feet on 6 Minute Walk Test with LRAD to equal norm for community dwelling older adults between ages 66-89 years and improve his community mobility and ability to shop at big box stores.  Baseline: to be tested visit 2 as appropriate (03/18/23); 1170 feet (03/22/2023); 1451 feet while holding SPC, mild antalgic gait favoring R LE.  Reports pain in right ankle by end (04/29/2023);  Goal status: MET   5.  Patient will score equal or greater than Level 2 on [place]ann Core Stability Test to demonstrate improved core strength and motor control to decrease feeling of weakness in the back and improve ability to ambulate in the community.  Baseline: To be tested at future date as appropriate (03/18/23); Level 1 (03/22/2023); level 3 (04/29/2023);  Goal status: MET       PLAN:   PT FREQUENCY: 1-2x/week   PT DURATION: 3 weeks   PLANNED INTERVENTIONS: Therapeutic exercises, Therapeutic activity,  Neuromuscular re-education, Balance training, Gait training, Patient/Family education, Self Care, Joint mobilization, Stair training, DME instructions, Dry Needling, Electrical stimulation, Spinal mobilization, Cryotherapy, Moist heat, Manual therapy, and Re-evaluation.   PLAN FOR NEXT SESSION: Progressive core/LE/functional strengthening and balance exercises; neurodynamic exercises and manual therapy as needed.    Cira Rue, PT, DPT 05/18/2023, 2:33 PM  [facility] [address]  [address] [place], [place] [address] P: [phone] I F: [phone]

## 2023-05-20 ENCOUNTER — Encounter: Payer: Self-pay | Admitting: Physical Therapy

## 2023-05-20 ENCOUNTER — Ambulatory Visit: Payer: Medicare Other | Admitting: Physical Therapy

## 2023-05-20 DIAGNOSIS — M5459 Other low back pain: Secondary | ICD-10-CM | POA: Diagnosis not present

## 2023-05-20 DIAGNOSIS — R208 Other disturbances of skin sensation: Secondary | ICD-10-CM

## 2023-05-20 DIAGNOSIS — R2689 Other abnormalities of gait and mobility: Secondary | ICD-10-CM

## 2023-05-20 DIAGNOSIS — M6281 Muscle weakness (generalized): Secondary | ICD-10-CM

## 2023-05-20 NOTE — Therapy (Signed)
OUTPATIENT PHYSICAL THERAPY TREATMENT NOTE / DISCHARGE SUMMARY Dates of reporting from 03/18/2023 to 05/20/2023   Patient Name: Mark Holmes MRN: [id number] DOB:[birth year], 88 y.o., male Today's Date: 05/20/2023  PCP: Eustaquio Boyden, MD  REFERRING PROVIDER: Eustaquio Boyden, MD; also recommended by Cristopher Peru, MD (Neurology)   END OF SESSION:   PT End of Session - 05/20/23 1302     Visit Number 16    Number of Visits 16    Date for PT Re-Evaluation 05/21/23    Authorization Type [employer] MEDICARE reporting period from 03/18/2023    Progress Note Due on Visit 20    PT Start Time 1300    PT Stop Time 1345    PT Time Calculation (min) 45 min    Activity Tolerance Patient tolerated treatment well;Patient limited by fatigue    Behavior During Therapy [facility] for tasks assessed/performed              Past Medical History:  Diagnosis Date   Benign prostatic hypertrophy with elevated PSA    per Dr. Achilles Dunk   History of CT scan of brain 1992   normal   Hyperglycemia 01/2006   102   Hyperlipemia    Hypertension    Seizure disorder [facility])    Past Surgical History:  Procedure Laterality Date   ANTERIOR CERVICAL DECOMP/DISCECTOMY FUSION  05/2018   for myelopathy -C3/4 ([facility])   CARPAL TUNNEL RELEASE Right 06/2019   ([facility])   CATARACT EXTRACTION W/ INTRAOCULAR LENS IMPLANT Right 2012   [place] [facility]   CATARACT EXTRACTION W/PHACO Left 01/14/2022   Procedure: CATARACT EXTRACTION PHACO AND INTRAOCULAR LENS PLACEMENT (IOC) LEFT;  Surgeon: Lockie Mola, MD;  Location: [facility];  Service: Ophthalmology;  Laterality: Left;  7.55 01:03.6   History of EEG  1985   normal   hospitalization  1981   observation for seizuare   TONSILLECTOMY     TRANSURETHRAL RESECTION OF PROSTATE  10/2005   [facility]   TRANSURETHRAL RESECTION OF PROSTATE  08/2017   (rpt) [facility]   Patient Active Problem List   Diagnosis Date Noted   Spinal stenosis of  lumbar region with neurogenic claudication 03/04/2023   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   Bilateral hearing loss 06/30/2022   Brain aneurysm 06/30/2022   History of ischemic stroke 06/20/2022   Nephrotic range proteinuria 06/04/2022   Hypoproteinemia (HCC) 06/01/2022   Atypical chest pain 07/10/2020   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   Unsteadiness on feet 11/08/2019   History of spinal surgery 01/17/2019   Neck pain 01/17/2019   Weakness of hand 01/17/2019   BPH (benign prostatic hyperplasia) 01/17/2019   Systolic murmur 07/04/2018   Right carpal tunnel syndrome 02/02/2018   Ulnar neuropathy at elbow, left 02/02/2018   Peripheral neuropathy 01/26/2018   Bilateral buttock pain 06/29/2016   Failed vision screen 06/29/2016   Advanced care planning/counseling discussion 06/21/2015   Medicare annual wellness visit, subsequent 06/18/2014   Health maintenance examination 06/18/2014   Chronic ankle pain 06/18/2014   Incomplete emptying of bladder 06/07/2013   Vitamin D deficiency 05/20/2013   THYROID NODULE 05/17/2007   Mixed hyperlipidemia 05/17/2007   Essential tremor 05/17/2007   Essential hypertension 05/17/2007   Cervical spondylosis with myelopathy and radiculopathy 05/17/2007   Seizure disorder (HCC) 05/17/2007    REFERRING DIAG: unsteadiness on feet, cervical spondylosis with myelopathy and radiculopathy, spinal stenosis of lumbar region with neurogenic claudication   THERAPY DIAG:  Other low back pain  Other disturbances of skin  sensation  Other abnormalities of gait and mobility  Muscle weakness (generalized)  Rationale for Evaluation and Treatment Rehabilitation  PERTINENT HISTORY: Patient is a 88 y.o. male who presents to outpatient physical therapy with a referral for medical diagnosis unsteadiness on feet, cervical spondylosis with myelopathy and radiculopathy, spinal stenosis of lumbar region with neurogenic claudication. This patient's chief complaints  consist of burning and numbness in B lower legs as well as feeling of weakness in low back and lower legs leading to the following functional deficits: difficulty with balance, walking, sleeping, difficulty with getting up and moving around, household and community mobility, going out in the community, housework, gardening, going to the store.. 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  Patient denies hx of cancer, lung problems, heart problems, diabetes, unexplained weight loss, unexplained changes in bowel or bladder problems, and osteoporosis.   PRECAUTIONS: fall  SUBJECTIVE:                                                                                                                                                                                      SUBJECTIVE STATEMENT:  Patient arrives with [facility] and states his shoulder is better but still bothers him some. He is agreeable to discharge from PT today but still  wishes he could continue.    PAIN:  NPRS 0/10 except feeling of numbness and compression on lower legs.     OBJECTIVE  SELF-REPORTED FUNCTION FOTO score: 73/100 (NOC-Neuromuscular disorder questionnaire, last measured 05/18/2023)  TODAY'S TREATMENT:    Therapeutic exercise: to centralize symptoms and improve ROM, strength, muscular endurance, and activity tolerance required for successful completion of functional activities.  - curl up with B LE at table top, 5x30 seconds - squats with chair tap, 1x10  Superset:  - wall ball with sit <> stand in 17 inch chair against wall,  3x10 with small green ball - bent over row bracing hand on chair seat, 2x10 each side with 15#slam ball in pillow case.   Superset:  - kickstand RDL, 2x10 each side with 15#slam ball in pillow case and contralateral UE steadying on wall or back of chair.  - modified hands elevated push ups on sink counter, 1x10  - hip thrusts with back on 17 inch chair, 1x10 AROM - single leg hip thrusts with back on 17 inch chair, 1-2x5 each side with some support from contralateral LE.  - hooklying single leg bridge 1x3-5 each side (to see how he could do a bridge version).  - [place] get up, half kneeling windmill, bodyweight, 1x10 each side.  - [place] get up, supine to hand prop, 1x6 with left UE on floor, 1x 4 to elbow with R UE down (too much pain in R shoulder to go to full prop). More reps as needed to improve sit up.  - Education on HEP including handout  - education on discharge recommendations.   Pt required multimodal cuing for proper technique and to facilitate improved neuromuscular control, strength, range of motion, and functional ability resulting in improved performance and form.    PATIENT EDUCATION:  Education details: Exercise purpose/form. Self management techniques. Discharge recommendations.  Education on HEP including handout  03/22/2023: Educated in PT request to not talk about PT's religious beliefs  and not touching PT unnecessarily per PT preference in a professional setting. Patient states he understands and agrees.  Reviewed cancelation/no-show policy with patient and confirmed patient has correct phone number for clinic; patient verbalized understanding (03/24/23).  Person educated: Patient Education method: Explanation, demonstration, handout Education comprehension: verbalized understanding, demonstrated understanding.    HOME EXERCISE PROGRAM:  See Patient Instructions for long term HEP  Access Code: [email or web address] URL: [email or web address] Date: 05/11/2023 Prepared by: Norton Blizzard  Exercises - The Hundred 3 Intermediate - Table Top  - 1 x daily - 10 reps - 30 seconds hold - Bird Dog with Resistance  - 1 x daily - 2-3 sets - 10 reps - Child's Pose Stretch  - 1 x daily - 3 reps - 20 seconds hold - Bear Walk  - 1 x daily - 3 sets - 5 reps - Bird Dog with Knee Taps  - 3 x weekly - 3 sets - 10 reps - Standing Single Shoulder Flexion Wall Slide with Palm Up  - 1 x daily - 2 sets - 10 reps - 5 seconds hold - Standing Shoulder Abduction Slides at Wall  - 1 x daily - 2 sets - 20 reps - 5 seconds hold  HOME EXERCISE PROGRAM [QZECKQB] View at "[email or web address]" using code: [email or web address] Modified [place] -  Repeat 10 Repetitions, Complete 2 Sets, Perform 3 Times a Week  HOME EXERCISE PROGRAM [[id number]] View at "[email or web address]" using code: [email or web address]  [place] Get Up part 2 -  Repeat 10 Repetitions, Complete 2 Sets, Perform 3 Times a Week  [place] Get Up part 1 -  Repeat 10 Repetitions, Complete 2 Sets, Perform 3 Times a Week  [place] Get Up part 4 -  Repeat 10 Repetitions, Complete 2 Sets, Perform 3 Times a Week  [place] Get Up Modified part 5 -  Repeat 10 Repetitions, Complete 2 Sets, Perform 3 Times a Week  HOME EXERCISE PROGRAM [[id number]] View at "[email or web address]" using code: [email or web address] DIPS IN CHAIR -  Repeat 3 Repetitions, Hold 1 Second(s), Complete 3 Sets,  Perform 3 Times a Week   ASSESSMENT:  CLINICAL IMPRESSION: Patient has attended 16 physical therapy sessions since starting current episode of care on 03/18/2023. Patient has met 4/5 long term goals and partially met the 5th one. He has been provided with an appropriate long term HEP and his FOTO score has not showed improvement over the last few visits, suggesting he is ready for discharge. Patient is doing advanced exercises but continues to have difficulty with his balance, largely related to chronic R ankle pain and instability that is unable to be improved further with PT and chronic neuropathy symptoms in B LE that also appears to be at max benefit from PT at this time. Patient is now discharged to a long term HEP with instructions to return to PT if his condition worsens or if he needs PT for another body part (possibly his R shoulder that he injured when his dog was being wild at the vet).    OBJECTIVE IMPAIRMENTS: Abnormal gait, decreased activity tolerance, decreased balance, decreased coordination, decreased endurance, decreased knowledge of condition, decreased mobility, difficulty walking, decreased ROM, decreased strength, hypomobility, impaired perceived functional ability, impaired sensation, and pain.    ACTIVITY LIMITATIONS: carrying, lifting, sitting, standing, squatting, sleeping, stairs, transfers, dressing, and locomotion level   PARTICIPATION LIMITATIONS: meal prep, cleaning, interpersonal relationship, shopping, community activity, yard work, and   difficulty with balance, walking, sleeping, difficulty with getting up and moving around, household and community mobility, going out in the community, housework, gardening, going to the store   PERSONAL FACTORS: Behavior pattern, Past/current experiences, Time since onset of injury/illness/exacerbation, and 3+ comorbidities:   CVA on 06/20/2022 (MRI showed punctate acute/subacute nonhemorrhagic infarct in the posterior left lower  midbrain near the 4th cranial nerve nucleus with remote lacunar infarcts of the right thalamus and right cerebellum),  4mm PCOM aneurysm (found incidentally on CTA 06/20/2022, currently undergoing conservative monitoring), concern over dilantin related neuropathy (starting to wean off), systolic murmur, benign prostatic hyperplasia with urinary obstruction (followed by urology), chronic R ankle pain (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 are also affecting patient's functional outcome.    REHAB POTENTIAL: Fair due to likelihood of neuropathy being the root cause of his chief complaint.   CLINICAL DECISION MAKING: Evolving/moderate complexity   EVALUATION COMPLEXITY: Moderate     GOALS: Goals reviewed with patient? No   SHORT TERM GOALS: Target date: 04/01/2023   Patient will be independent with initial home exercise program for self-management of symptoms. Baseline: Initial HEP to be provided at visit 2 as appropriate (03/18/23); Goal status: MET     LONG TERM GOALS: Target date: 04/29/2023. Target date updated to 05/21/2023 for all unmet goals on 04/29/2023.   Patient will be independent with a long-term home exercise program for self-management of symptoms.  Baseline: Initial HEP to be provided at visit 2 as appropriate (03/18/23); participating regularly (04/29/2023); Goal status: MET   2.  Patient will demonstrate improved FOTO by equal or greater than 10 points by visit #12 to demonstrate improvement in overall condition and self-reported functional  ability.  Baseline: emailed to patient (03/18/23); 54 at visit # 2 (04/18/2023); 77 at visit #10 (04/29/2023); 73  at visit #15 (05/18/2023);  Goal status: MET   3.  Patient will score equal or greater than 25/30 on Functional Gait Assessment to demonstrate low fall risk.  Baseline: to be tested at visit 2 as appropriate (03/18/23); 20/30 moderate fall risk (03/22/2023); 22/30 moderate fall risk (04/29/2023);  Goal status: Partially met   4.  Patient will ambulate equal or greater than 1370 feet on 6 Minute Walk Test with LRAD to equal norm for community dwelling older adults between ages 37-89 years and improve his community mobility and ability to shop at big box stores.  Baseline: to be tested visit 2 as appropriate (03/18/23); 1170 feet (03/22/2023); 1451 feet while holding SPC, mild antalgic gait favoring R LE.  Reports pain in right ankle by end (04/29/2023);  Goal status: MET   5.  Patient will score equal or greater than Level 2 on [place] Core Stability Test to demonstrate improved core strength and motor control to decrease feeling of weakness in the back and improve ability to ambulate in the community.  Baseline: To be tested at future date as appropriate (03/18/23); Level 1 (03/22/2023); level 3 (04/29/2023);  Goal status: MET       PLAN:   PT FREQUENCY: 1-2x/week   PT DURATION: 3 weeks   PLANNED INTERVENTIONS: Therapeutic exercises, Therapeutic activity, Neuromuscular re-education, Balance training, Gait training, Patient/Family education, Self Care, Joint mobilization, Stair training, DME instructions, Dry Needling, Electrical stimulation, Spinal mobilization, Cryotherapy, Moist heat, Manual therapy, and Re-evaluation.   PLAN FOR NEXT SESSION: Patient has been discharged from PT   Cira Rue, PT, DPT 05/20/2023, 3:13 PM  [facility] [address] [place], [place] [address] P: [phone] I F: [phone]

## 2023-05-27 NOTE — Telephone Encounter (Signed)
Patient contacted the office regarding this message, would like a follow up with Dr. Timoteo Expose thoughts regarding this. Patient says Earleen Reaper is a good way to communicate with him. Please advise, thank you.

## 2023-06-07 ENCOUNTER — Ambulatory Visit: Payer: Medicare Other | Admitting: Family Medicine

## 2023-06-07 ENCOUNTER — Encounter: Payer: Self-pay | Admitting: Family Medicine

## 2023-06-07 VITALS — BP 136/82 | HR 77 | Temp 97.7°F | Ht 71.0 in | Wt 165.4 lb

## 2023-06-07 DIAGNOSIS — G8929 Other chronic pain: Secondary | ICD-10-CM | POA: Diagnosis not present

## 2023-06-07 DIAGNOSIS — G5601 Carpal tunnel syndrome, right upper limb: Secondary | ICD-10-CM

## 2023-06-07 DIAGNOSIS — G40909 Epilepsy, unspecified, not intractable, without status epilepticus: Secondary | ICD-10-CM

## 2023-06-07 DIAGNOSIS — M79605 Pain in left leg: Secondary | ICD-10-CM

## 2023-06-07 DIAGNOSIS — M48062 Spinal stenosis, lumbar region with neurogenic claudication: Secondary | ICD-10-CM

## 2023-06-07 DIAGNOSIS — M79604 Pain in right leg: Secondary | ICD-10-CM

## 2023-06-07 DIAGNOSIS — M25571 Pain in right ankle and joints of right foot: Secondary | ICD-10-CM

## 2023-06-07 DIAGNOSIS — N052 Unspecified nephritic syndrome with diffuse membranous glomerulonephritis: Secondary | ICD-10-CM

## 2023-06-07 DIAGNOSIS — M25511 Pain in right shoulder: Secondary | ICD-10-CM

## 2023-06-07 DIAGNOSIS — R252 Cramp and spasm: Secondary | ICD-10-CM

## 2023-06-07 DIAGNOSIS — G620 Drug-induced polyneuropathy: Secondary | ICD-10-CM

## 2023-06-07 DIAGNOSIS — R809 Proteinuria, unspecified: Secondary | ICD-10-CM

## 2023-06-07 LAB — CK: Total CK: 51 U/L (ref 7–232)

## 2023-06-07 LAB — FERRITIN: Ferritin: 19.1 ng/mL — ABNORMAL LOW (ref 22.0–322.0)

## 2023-06-07 LAB — IBC PANEL
Iron: 65 ug/dL (ref 42–165)
Saturation Ratios: 29.2 % (ref 20.0–50.0)
TIBC: 222.6 ug/dL — ABNORMAL LOW (ref 250.0–450.0)
Transferrin: 159 mg/dL — ABNORMAL LOW (ref 212.0–360.0)

## 2023-06-07 LAB — MAGNESIUM: Magnesium: 2.1 mg/dL (ref 1.5–2.5)

## 2023-06-07 MED ORDER — PREGABALIN 25 MG PO CAPS: 25.0000 mg | ORAL_CAPSULE | Freq: Every day | ORAL | 0 refills | Status: DC

## 2023-06-07 NOTE — Patient Instructions (Addendum)
Lab test today to check iron levels for possible restless legs.  We will be in touch with results and may trial medicine for restless legs.  Consider Lyrica (pregabalin) for nerve pain (neuropathic pain).  I've sent in 25mg  dose to your pharmacy.

## 2023-06-07 NOTE — Progress Notes (Unsigned)
Ph: [phone] Fax: [phone]   Patient ID: Mark Holmes, male    DOB: Jul 20, 1935, 88 y.o.   MRN: [id number]  This visit was conducted in person.  BP 136/82   Pulse 77   Temp 97.7 F (36.5 C) (Temporal)   Ht 5\' 11"  (1.803 m)   Wt 165 lb 6 oz (75 kg)   SpO2 96%   BMI 23.07 kg/m    CC: 3 mo fu visit  Subjective:   HPI: Mark Holmes is a 88 y.o. male presenting on 06/07/2023 for Medical Management of Chronic Issues (Here for 3 mo follow up. )   Ongoing leg numbness/weakness with neuropathy (paresthesias from feet to knees). Nocturnal leg burning managed with aspercream. Fully off dilantin as of 06/06/2023. Now off gabapentin due to dizziness. Continues ALA and physical therapy. Also having R wrist/hand paresthesias, burning nerve pain and numbness worse at night.   Describes tightness to legs, at night getting cramps to left calf. They feel tight and stiff. Leg tightness present for over a year, getting worse.  No calf pain with walking.  No leg swelling.  No coldness to extremities or color changes.  Lab Results  Component Value Date   WBC 8.9 10/28/2022   HGB 11.6 (L) 10/28/2022   HCT 33.6 (L) 10/28/2022   MCV 85.5 10/28/2022   PLT 285 10/28/2022   Hgb 12.1 (04/2023)  Lab Results  Component Value Date   FERRITIN 97.1 06/28/2017    Injured R shoulder 3 months ago while holding his dog leash (boxer/hound mix). Worse pain at night, reaching overhead.   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  S/p R CTS release surgery 06/21/2019 - with persistent hand paresthesias.   He's been seeing [facility] outpatient physical therapy, completed course on 05/20/2023. Feels strength levels are better after this.   Recent rheumatology eval for large fiber neuropathy by Yvone Neu PA at [facility] - not thought autoimmune related. Normal anti-SSA/SSB as well as normal anti-dsDNA, neg ANA, normal  ESR, CRP.   Saw cardiology 04/2023 - stable period for known CAC and aortic atherosclerosis. Known h/o CVA with residual deficits, completed 3 wks DAPT, continues aspirin. Sees neurology.   Sees nephrology Dr Suezanne Jacquet regularly, last seen 04/2023.      Relevant past medical, surgical, family and social history reviewed and updated as indicated. Interim medical history since our last visit reviewed. Allergies and medications reviewed and updated. Outpatient Medications Prior to Visit  Medication Sig Dispense Refill   Alpha-Lipoic Acid 600 MG TABS Take 1 tablet by mouth daily.     aspirin EC 81 MG tablet Take 1 tablet (81 mg total) by mouth daily. Swallow whole. 30 tablet 12   aspirin-acetaminophen-caffeine (EXCEDRIN MIGRAINE) 250-250-65 MG per tablet Take 1 tablet by mouth 2 (two) times daily as needed.      atorvastatin (LIPITOR) 40 MG tablet Take 1 tablet (40 mg total) by mouth at bedtime. 90 tablet 3   Cholecalciferol (VITAMIN D3) 1.25 MG (50000 UT) TABS Take 1 tablet by mouth once a week. 12 tablet 3   finasteride (PROSCAR) 5 MG tablet Take 1 tablet (5 mg total) by mouth daily. 90 tablet 3   folic acid (FOLVITE) 400 MCG tablet Take 1 tablet (0.4 mg total) by mouth daily.     Omega 3 1000 MG CAPS Take 1 capsule by mouth daily.     ramipril (ALTACE) 10 MG capsule  Take 1 capsule (10 mg total) by mouth daily. (Patient taking differently: Take 10 mg by mouth 2 (two) times daily.) 90 capsule 3   terazosin (HYTRIN) 10 MG capsule Take 1 capsule (10 mg total) by mouth daily. 90 capsule 3   b complex vitamins capsule Take 1 capsule by mouth daily. [facility] 3 in 1 (Patient not taking: Reported on 04/28/2023)     phenytoin (DILANTIN) 100 MG ER capsule TAKE 2 CAPSULES BY MOUTH TWICE  DAILY (Patient taking differently: Take 100 mg by mouth in the morning and at bedtime.) 360 capsule 0   No facility-administered medications prior to visit.     Per HPI unless specifically  indicated in ROS section below Review of Systems  Objective:  BP 136/82   Pulse 77   Temp 97.7 F (36.5 C) (Temporal)   Ht 5\' 11"  (1.803 m)   Wt 165 lb 6 oz (75 kg)   SpO2 96%   BMI 23.07 kg/m   Wt Readings from Last 3 Encounters:  06/07/23 165 lb 6 oz (75 kg)  04/28/23 166 lb 12.8 oz (75.7 kg)  03/03/23 170 lb 4 oz (77.2 kg)      Physical Exam    Results for orders placed or performed in visit on 03/03/23  Phenytoin level, free and total  Result Value Ref Range   PHENYTOIN, FREE 0.9 (L) 1.0 - 2.0 mg/L  VITAMIN D 25 Hydroxy (Vit-D Deficiency, Fractures)  Result Value Ref Range   VITD 34.59 30.00 - 100.00 ng/mL    Assessment & Plan:   Problem List Items Addressed This Visit   None    No orders of the defined types were placed in this encounter.   No orders of the defined types were placed in this encounter.   There are no Patient Instructions on file for this visit.  Follow up plan: No follow-ups on file.  Eustaquio Boyden, MD

## 2023-06-08 ENCOUNTER — Encounter: Payer: Self-pay | Admitting: Family Medicine

## 2023-06-08 DIAGNOSIS — M79605 Pain in left leg: Secondary | ICD-10-CM | POA: Insufficient documentation

## 2023-06-08 DIAGNOSIS — M79604 Pain in right leg: Secondary | ICD-10-CM | POA: Insufficient documentation

## 2023-06-08 DIAGNOSIS — M25511 Pain in right shoulder: Secondary | ICD-10-CM | POA: Insufficient documentation

## 2023-06-08 NOTE — Assessment & Plan Note (Signed)
Worsening symptoms - consider return to hand surgeon.

## 2023-06-08 NOTE — Assessment & Plan Note (Signed)
Consider updated lumbar MRI if ongoing leg pain despite above plan.

## 2023-06-08 NOTE — Assessment & Plan Note (Addendum)
Appreciate neurology care. Has fully tapered off dilantin as of yesterday.  Plan to stay off AED given remote seizure history.  Concern Dilantin contributed to neuropathy and nephropathy.

## 2023-06-08 NOTE — Assessment & Plan Note (Signed)
Describes tightness to legs as well as increase in cramping to left calf.  Symptoms present for over a year, progressively worsening.  No claudication type symptoms.  Possible RLS - check iron panel, CPK, Mg levels. Recent K normal.  Consider Requip vs Mirapex.

## 2023-06-08 NOTE — Assessment & Plan Note (Signed)
Injured while holding dog leash (boxer/hound mix).  Exam today suspicious for RTC injury - will send home with RTC injury exercises, suggest sports med f/u if ongoing pain.

## 2023-06-08 NOTE — Assessment & Plan Note (Signed)
Continue ankle AirCast brace use

## 2023-06-08 NOTE — Assessment & Plan Note (Addendum)
Chronic, severe, thought Dilantin contributing. Now fully tapered off Dilantin x1 day - monitor effect.  Gabapentin was ineffective so stopped.  Consider Lyrica vs TCA. Will send in 25mg  Lyrica to try at night time pending labwork today.  Had reassuring rheumatological evaluation.

## 2023-06-08 NOTE — Assessment & Plan Note (Signed)
Continue nephrology f/u [facility])

## 2023-08-05 ENCOUNTER — Encounter (INDEPENDENT_AMBULATORY_CARE_PROVIDER_SITE_OTHER): Payer: Self-pay

## 2023-08-18 ENCOUNTER — Other Ambulatory Visit: Payer: Self-pay | Admitting: Student

## 2023-08-18 DIAGNOSIS — G608 Other hereditary and idiopathic neuropathies: Secondary | ICD-10-CM

## 2023-08-19 ENCOUNTER — Other Ambulatory Visit: Payer: Medicare Other

## 2023-09-04 ENCOUNTER — Other Ambulatory Visit: Payer: Self-pay | Admitting: Family Medicine

## 2023-09-04 ENCOUNTER — Ambulatory Visit
Admission: RE | Admit: 2023-09-04 | Discharge: 2023-09-04 | Disposition: A | Discharge status: Home / Self Care | Payer: Medicare Other | Source: Ambulatory Visit | Attending: Student | Admitting: Student

## 2023-09-04 DIAGNOSIS — G608 Other hereditary and idiopathic neuropathies: Secondary | ICD-10-CM

## 2023-09-07 ENCOUNTER — Ambulatory Visit: Payer: Medicare Other | Admitting: Family Medicine

## 2023-09-14 ENCOUNTER — Ambulatory Visit: Payer: Medicare Other | Admitting: Family Medicine

## 2023-09-14 ENCOUNTER — Encounter: Payer: Self-pay | Admitting: Family Medicine

## 2023-09-14 VITALS — BP 136/82 | HR 70 | Temp 97.8°F | Ht 71.0 in | Wt 160.2 lb

## 2023-09-14 DIAGNOSIS — E559 Vitamin D deficiency, unspecified: Secondary | ICD-10-CM

## 2023-09-14 DIAGNOSIS — G40909 Epilepsy, unspecified, not intractable, without status epilepticus: Secondary | ICD-10-CM | POA: Diagnosis not present

## 2023-09-14 DIAGNOSIS — G620 Drug-induced polyneuropathy: Secondary | ICD-10-CM | POA: Diagnosis not present

## 2023-09-14 DIAGNOSIS — M79605 Pain in left leg: Secondary | ICD-10-CM

## 2023-09-14 DIAGNOSIS — R809 Proteinuria, unspecified: Secondary | ICD-10-CM

## 2023-09-14 DIAGNOSIS — M79604 Pain in right leg: Secondary | ICD-10-CM

## 2023-09-14 DIAGNOSIS — M48062 Spinal stenosis, lumbar region with neurogenic claudication: Secondary | ICD-10-CM

## 2023-09-14 MED ORDER — TERAZOSIN HCL 10 MG PO CAPS: 10.0000 mg | ORAL_CAPSULE | Freq: Every day | ORAL | 1 refills | Status: DC

## 2023-09-14 MED ORDER — RAMIPRIL 10 MG PO CAPS: 10.0000 mg | ORAL_CAPSULE | Freq: Two times a day (BID) | ORAL | 1 refills | Status: DC

## 2023-09-14 MED ORDER — PREGABALIN 50 MG PO CAPS: 50.0000 mg | ORAL_CAPSULE | Freq: Every day | ORAL | 5 refills | Status: DC

## 2023-09-14 MED ORDER — VITAMIN D3 1.25 MG (50000 UT) PO TABS: 1.0000 | ORAL_TABLET | ORAL | 1 refills | Status: DC

## 2023-09-14 MED ORDER — ATORVASTATIN CALCIUM 40 MG PO TABS: 40.0000 mg | ORAL_TABLET | Freq: Every day | ORAL | 1 refills | Status: DC

## 2023-09-14 MED ORDER — IRON (FERROUS SULFATE) 325 (65 FE) MG PO TABS: 325.0000 mg | ORAL_TABLET | ORAL | Status: AC

## 2023-09-14 NOTE — Therapy (Unsigned)
OUTPATIENT PHYSICAL THERAPY EVALUATION   Patient Name: Mark Holmes MRN: [id number] DOB:1935-11-13, 88 y.o., male Today's Date: 09/15/2023  END OF SESSION:  PT End of Session - 09/15/23 0914     Visit Number 1    Number of Visits 13    Date for PT Re-Evaluation 12/08/23    Authorization Type [employer] MEDICARE reporting period from 09/15/2023    Authorization Time Period Auth is req    Authorization - Visit Number 1    Authorization - Number of Visits 1    Progress Note Due on Visit 10    PT Start Time 0903    PT Stop Time 0940    PT Time Calculation (min) 37 min    Behavior During Therapy [facility] for tasks assessed/performed             Past Medical History:  Diagnosis Date   Benign prostatic hypertrophy with elevated PSA    per Dr. Achilles Dunk   History of CT scan of brain 1992   normal   Hyperglycemia 01/2006   102   Hyperlipemia    Hypertension    Seizure disorder [facility])    Past Surgical History:  Procedure Laterality Date   ANTERIOR CERVICAL DECOMP/DISCECTOMY FUSION  05/2018   for myelopathy -C3/4 ([facility])   CARPAL TUNNEL RELEASE Right 06/2019   ([facility])   CATARACT EXTRACTION W/ INTRAOCULAR LENS IMPLANT Right 2012   [place] [facility]   CATARACT EXTRACTION W/PHACO Left 01/14/2022   Procedure: CATARACT EXTRACTION PHACO AND INTRAOCULAR LENS PLACEMENT (IOC) LEFT;  Surgeon: Lockie Mola, MD;  Location: [facility];  Service: Ophthalmology;  Laterality: Left;  7.55 01:03.6   History of EEG  1985   normal   hospitalization  1981   observation for seizuare   TONSILLECTOMY     TRANSURETHRAL RESECTION OF PROSTATE  10/2005   [facility]   TRANSURETHRAL RESECTION OF PROSTATE  08/2017   (rpt) [facility]   Patient Active Problem List   Diagnosis Date Noted   Bilateral leg pain 06/08/2023   Right shoulder pain 06/08/2023   Spinal stenosis of lumbar region with neurogenic claudication 03/04/2023   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   Bilateral hearing  loss 06/30/2022   Brain aneurysm 06/30/2022   History of ischemic stroke 06/20/2022   Nephrotic range proteinuria 06/04/2022   Hypoproteinemia (HCC) 06/01/2022   Atypical chest pain 07/10/2020   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   Unsteadiness on feet 11/08/2019   History of spinal surgery 01/17/2019   Neck pain 01/17/2019   Weakness of hand 01/17/2019   BPH (benign prostatic hyperplasia) 01/17/2019   Systolic murmur 07/04/2018   Right carpal tunnel syndrome 02/02/2018   Ulnar neuropathy at elbow, left 02/02/2018   Peripheral neuropathy 01/26/2018   Bilateral buttock pain 06/29/2016   Failed vision screen 06/29/2016   Advanced care planning/counseling discussion 06/21/2015   Medicare annual wellness visit, subsequent 06/18/2014   Health maintenance examination 06/18/2014   Chronic ankle pain 06/18/2014   Incomplete emptying of bladder 06/07/2013   Vitamin D deficiency 05/20/2013   THYROID NODULE 05/17/2007   Mixed hyperlipidemia 05/17/2007   Essential tremor 05/17/2007   Essential hypertension 05/17/2007   Cervical spondylosis with myelopathy and radiculopathy 05/17/2007   Seizure disorder (HCC) 05/17/2007    PCP: Eustaquio Boyden, MD  REFERRING PROVIDER:  Janice Coffin, PA-C (Neurology)  REFERRING DIAG: neuropathy, right shoulder pain  THERAPY DIAG:  Chronic right shoulder pain  Muscle weakness (generalized)  Other abnormalities of gait and mobility  Neuralgia  and neuritis  Rationale for Evaluation and Treatment: Rehabilitation  ONSET DATE: right shoulder pain end of April 2024, neuropathy symptoms chronic  SUBJECTIVE:                                                                                                                                                                                      SUBJECTIVE STATEMENT: Patient is known to this clinic and PT. He is returning to PT with right shoulder pain an continued difficulty with neuropathy. He also has been  having trouble with right carpal tunnel symptoms. He agrees to focus on his shoulder pain first in PT.   He states his right shoulder when he was trying to restrain his dog, Bella (65 lbs),  from getting to the front seat when he was at the vet. He was in the back seat with the dog while his wife went into the vet. The leash pulled on his shoulder when she tried to get to the front seat. She is in a harness. He states his shoulder hurt some at the time of the injury, but really got worse later. At the time he was in physical therapy for balance and PT recommenced he return to PT for his shoulder if it did not get better. He feels like sometimes it is improving, but then it gives him more problems.   He has gotten off of Dilantin in June, with no improvements in his neuropathy. He continues to be very bothered by his lower legs feeling tight after sitting for about 45 minutes. He feels like it is getting worse.   Patient states he has been doing his HEP since last episode of care. He has some he really likes, such as the hip thrust (which he can do with one leg now). He has the hardest time with the reverse lunge with the pole. He has to focus and keep his head in a certain position.    Hand dominance: Right  PERTINENT HISTORY: Patient is a 88 y.o. male who presents to outpatient physical therapy with a referral for medical diagnosis neuropathy, right shoulder pain. This patient's chief complaints consist of right shoulder pain and weakness and ongoing difficulty with mobility due to imbalance, tightness/pain in his B lower legs, and generalized weakness leading to the following functional deficits: difficulty with anything that requires use of right UE, especially raising arm laterally, dressing, walking the dog, keeping his balance, sleeping, getting up and moving around, household and community mobility, going out in the community, housework, gardening, going to the store. Relevant past medical history  and comorbidities include CVA on 06/20/2022 (MRI showed punctate acute/subacute nonhemorrhagic infarct in the  posterior left lower midbrain near the 4th cranial nerve nucleus with remote lacunar infarcts of the right thalamus and right cerebellum),  4mm PCOM aneurysm (found incidentally on CTA 06/20/2022, currently undergoing conservative monitoring), concern over dilantin related neuropathy (starting to wean off), systolic murmur, benign prostatic hyperplasia with urinary obstruction (followed by urology), chronic R ankle pain (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  Patient denies hx of cancer, lung problems, heart problems, diabetes, unexplained weight loss, unexplained changes in bowel or bladder problems, and osteoporosis.   PAIN:  Are you having pain? Yes NPRS: Current: 3/10,  Best: 2/10, Worst: 7/10. Pain location: "right up in the knob" inside the right shoulder joint, posterior shoulder Pain description: ache, hurts when he moves it Aggravating factors: hurts when he moves it, falling asleep on the right shoulder, reaching sideways, reaching overhead with elbow extended.  Relieving factors: cold wash rag, not moving the shoulder  FUNCTIONAL LIMITATIONS: difficulty with anything that requires use of right UE, especially raising arm laterally, dressing, walking the dog, keeping his balance, sleeping, getting up and moving around, household and community mobility, going out in the community, housework, gardening, going to the store.   LEISURE: gardening,  taking the dog out   PRECAUTIONS: Fall  WEIGHT BEARING RESTRICTIONS: No  FALLS:  Has patient fallen in last 6 months? No  LIVING ENVIRONMENT: Lives with: lives with their spouse Lives in: 2 story home where his computer and bathroom is Stairs: 14 steps with handrail on left to 2nd floor, 5 steps to get into the home with handrail (both sides but cannot reach).  Has following  at home: Single point cane  OCCUPATION: retired  PLOF: Independent  PATIENT GOALS: "to be able to use my right shoulder to better facilitate my exercising"   NEXT MD VISIT:   OBJECTIVE  DIAGNOSTIC FINDINGS:  No recent right shoulder imaging.   Lumbar spine MRI completed 09/04/2023 (awaiting results).   Per recent neurology visit:   NCS/EMG upper extremity 02/02/2018: Abnormal, mild right mid cervical radiculopathy, very mild right carpal tunnel syndrome, and mild left ulnar neuropathy at the elbow.   NCS/EMG lower extremity 04/08/2021: This is an abnormal electrodiagnostic study consistent with a generalized sensory predeominant polyneuropathy   MRI Cervical Wo Contrast 11/28/20: Neurofibroma of the left C2 nerve as seen previously, maximal  transverse diameter 11-12 mm. Previous ACDF C3-4. Canal stenosis remains at this level with AP  diameter of 7 mm, without worsening. No actual cord compression. Spondylosis and facet arthropathy with degenerative anterolisthesis of C5-6 and C7-T1. Canal narrowing but no cord  compression. Foraminal stenosis primarily due to facet osteophytes and uncovertebral osteophytes that could cause neural compression bilateral at C2-3, bilateral at C3-4, bilateral at C4-5, bilateral but left more than right at C5-6, bilateral but left more than right at C6-7 and bilateral but left more than right at C7-T1.   MRI Lumbar Spine (L-Spine) 11/28/2020: Degenerative disc disease and degenerative facet disease  throughout the lumbar region which could be associated with back pain.  Thoracolumbar scoliotic curvature. L2-3: Moderate central canal stenosis. Bilateral lateral recess and foraminal stenosis left worse  than right. Neural compression could occur at this level, particularly on the left. L3-4: Bilateral lateral recess and foraminal stenosis, right worse than left. Neural compression could occur on either side, particularly on the right. L4-5: Bilateral lateral recess and foraminal stenosis, right more than left. Neural compression could occur on either side, particularly the right. L5-S1: Bilateral foraminal narrowing with some potential to affect either exiting L5 nerve.   MRI Brain without contrast 06/20/2022: Punctate acute/subacute nonhemorrhagic infarct in the posterior left lower midbrain near the fourth cranial nerve nucleus. Remote lacunar infarcts of the right thalamus and right cerebellum. Mild atrophy and white matter disease likely reflects the sequela of chronic microvascular ischemia.   CT Head without contrast 06/20/2022: No acute intracranial abnormality. Chronic microvascular ischemic change and cerebral volume loss. Paranasal sinus disease.   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 No other new acute intracranial abnormality. Underlying atrophy with chronic small-vessel ischemic disease, with small remote right thalamic and right cerebellar infarcts.   CTA Neck 06/21/2022: Negative CTA for large vessel occlusion or other emergent finding. Atheromatous change about the right carotid bulb with associated stenosis of up to 50% by NASCET criteria. Extensive intracranial atheromatous irregularity. Associated moderate to severe proximal left P2 stenosis. 4 mm left PCOM aneurysm. Diffuse tortuosity of the major arterial vasculature of the head and neck, suggesting chronic underlying hypertension.   SELF- REPORTED FUNCTION FOTO score: 54/100 (shoulder  questionnaire)  OBSERVATION/INSPECTION Posture Posture (seated): WFL Posture (standing): wide stance, some flattening of lumbar curve noted, ankle brace on right ankle.  Anthropometrics Tremor: mild in hands during MMT Body composition: BMI 24.3 Muscle bulk: decreased at bilateral shoulders.  Functional Mobility Transfers: sit <> stand mod I for increased time, use of B UE, and with report of pain.  Gait: ambulates in clinic and to/from parking lot with SPC, wide base of support with mild-moderate ataxia noted.    PERIPHERAL JOINT MOTION (in degrees)  ACTIVE RANGE OF MOTION (AROM) *Indicates pain 09/15/23 Date Date  Joint/Motion R/L R/L R/L  Shoulder     Flexion 160*/153 / /  Extension 52*/54 / /  Abduction  50*/180 / /  External rotation 117*/78 / /  Internal rotation sacrum/T6 / /  Comments:  09/15/23: compensatory shoulder hike with right shoulder abduction, ER measured in neutral. B elbows, wrists, an hands grossly WFL.    MUSCLE PERFORMANCE (MMT):  *Indicates pain 09/15/23 Date Date  Joint/Motion R/L R/L R/L  Shoulder     Flexion 3*/3+* / /  Abduction (C5) 2*/4- / /  External rotation 4*/3 / /  Internal rotation 4+/5 / /  Extension / / /  Elbow     Flexion (C6) 4+/4+ / /  Extension (C7) 5/5 / /  Hand     Thumb extension (C8) B WFL / /  Finger abduction (T1) B WFL / /  Comments:   SPECIAL TESTS:  SHOULDER SPECIAL TESTS Neer's: R = positive Drop arm test: R = positive Hawkins-Kennedy test: R = positive, Infraspinatus test: R = positive,  ER Lag test: R = negative  PALPATION: TTP at right supraspinatus, infraspinatus, and anterior GH joint line     TODAY'S TREATMENT:   education  PATIENT EDUCATION: Education details: Exercise purpose/form. Self management techniques. Education on diagnosis, prognosis, POC, anatomy and physiology of current condition. Education on HEP including handout. Person educated: Patient Education method: Explanation and Verbal  cues  Education comprehension: verbalized understanding and needs further education  HOME EXERCISE PROGRAM: TBD  ASSESSMENT:  CLINICAL IMPRESSION: Patient is a 88 y.o. male referred to outpatient physical therapy with a medical diagnosis of neuropathy, right shoulder pain who presents with signs and symptoms consistent with right shoulder pain and weakness consistent with likely full tear of right supraspinatus and difficulty with balance and mobility related to his chronic peripheral neuropathy. Today's evaluation focused on the right shoulder pain and dysfunction. Patient educated on the role of PT with a likely full suprapsinatus tear, including low chance of recovering active or resistive shoulder abduction but possible improvement in pain and function below shoulder height. Patient recommended to see an orthopedist for further diagnostic work up an assessment as desired. Plan to focus physical therapy on strengthening the shoulder girdle and functional motions a tolerated an needed to maximize function and quality of life. Patient's unsteadiness and difficulty with mobility will be assessed and addressed a appropriate in the future. Patient presents with significant pain, muscle performance, (power/endurance/strength), motor control, balance, gait, joint stability, and activity tolerance impairments that are limiting ability to complete  anything that requires use of right UE, especially raising arm laterally, dressing, walking the dog, keeping his balance, sleeping, getting up and moving around, household and community mobility, going out in the community, housework, gardening, going to the store without difficulty. Patient will benefit from skilled physical therapy intervention to address current body structure impairments and activity limitations to improve function and work towards goals set in current POC in order to return to prior level of function or maximal functional improvement.   OBJECTIVE  IMPAIRMENTS: Abnormal gait, decreased activity tolerance, decreased balance, decreased coordination, decreased endurance, decreased knowledge of condition, decreased mobility, difficulty walking, decreased ROM, decreased strength, impaired perceived functional ability, increased muscle spasms, impaired sensation, impaired UE functional use, improper body mechanics, postural dysfunction, and pain.   ACTIVITY LIMITATIONS: carrying, lifting, bending, sitting, standing, squatting, sleeping, stairs, transfers, bathing, dressing, reach over head, hygiene/grooming, locomotion level, and caring for others  PARTICIPATION LIMITATIONS: laundry, interpersonal relationship, shopping, community activity, yard work, and   difficulty with anything that requires use of right UE, especially raising arm laterally, dressing, walking the dog, keeping his balance, sleeping, getting up and moving around, household and community mobility, going out in the community, housework, gardening, going to the store  PERSONAL FACTORS: Age, Behavior pattern, Fitness, Past/current experiences, Time since onset of injury/illness/exacerbation, and 3+ comorbidities:   CVA on 06/20/2022 (MRI showed punctate acute/subacute nonhemorrhagic infarct in the posterior left lower midbrain near the 4th cranial nerve nucleus with remote lacunar infarcts of the right thalamus and right cerebellum),  4mm PCOM aneurysm (found incidentally on CTA 06/20/2022, currently undergoing conservative monitoring), concern over dilantin related neuropathy (starting to wean off), systolic murmur, benign prostatic hyperplasia with urinary obstruction (followed by urology), chronic R ankle pain (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   REHAB POTENTIAL: Fair due to testing suggesting full thickness R supraspinatus tear, and balance/mobility symptoms responding incompletely to PT in the past  CLINICAL DECISION MAKING: Evolving/moderate complexity  EVALUATION COMPLEXITY: Moderate   GOALS: Goals reviewed with patient? No  SHORT TERM GOALS: Target date: 09/29/2023  Patient will be independent with initial home exercise program for self-management of symptoms. Baseline: Initial HEP to be provided at visit 2 as appropriate (09/15/23); Goal status: INITIAL   LONG TERM GOALS: Target date: 12/08/2023  Patient will be independent with a long-term home exercise program for self-management of symptoms.  Baseline: Initial HEP to be provided at visit 2 as appropriate (09/15/23); Goal status: INITIAL  2.  Patient will demonstrate improved shoulder FOTO to equal or greater than 63 by visit #11 to demonstrate improvement in overall condition and self-reported functional ability.  Baseline: 54 (09/15/23); Goal status: INITIAL  3.  Patient will report the ability to sleep on his right shoulder without being awoken by right shoulder pain.  Baseline: cannot sleep on the right side (09/15/23); Goal status: INITIAL  4.  Patient will score equal or greater than 25/30 on Functional Gait Assessment to demonstrate low fall risk.  Baseline: to be tested at visit 2 as appropriate (09/15/23); Goal status: INITIAL  5.  Patient will complete community, work and/or recreational activities with 50% less limitation due to current condition.  Baseline: difficulty with anything that requires use of right UE, especially raising arm laterally, dressing, walking the dog, keeping his balance,  sleeping, getting up and moving around, household and community mobility, going out in the community, housework, gardening, going to the store (09/15/23); Goal status: INITIAL   PLAN:  PT FREQUENCY: 1-2x/week  PT DURATION: 12 weeks  PLANNED INTERVENTIONS: Therapeutic exercises, Therapeutic activity, Neuromuscular re-education, Balance training, Gait training, Patient/Family education, Self Care, Joint mobilization, Dry Needling, Electrical stimulation, Spinal mobilization, Cryotherapy, Moist heat, Manual therapy, and Re-evaluation  PLAN FOR NEXT SESSION: update HEP as appropriate, progressive UE/LE/functional strengthening/motor control/balance exercises as tolerated, manual therapy as needed, education.    Cira Rue, PT, DPT 09/15/2023, 6:52 PM   [facility] [address] [place], [place] [address] P: [phone] I F: [phone]

## 2023-09-14 NOTE — Progress Notes (Signed)
Ph: [phone] Fax: [phone]   Patient ID: Mark Holmes, male    DOB: August 31, 1935, 88 y.o.   MRN: [id number]  This visit was conducted in person.  BP 136/82   Pulse 70   Temp 97.8 F (36.6 C) (Temporal)   Ht 5\' 11"  (1.803 m)   Wt 160 lb 4 oz (72.7 kg)   SpO2 96%   BMI 22.35 kg/m    CC: 3 month f/u visit  Subjective:   HPI: Mark Holmes is a 88 y.o. male presenting on 09/14/2023 for Medical Management of Chronic Issues (Here for 3 mo f/u. States Lyrica works and requests 90-day supply. )   Notes episodes of forgetfulness where he repeatedly asks wife the same question. No change in frequency of episodes off Dilantin.  He completed wean off Dilantin. Plan to start lamictal if recurrent seizure. Currently remains seizure free. He had reassuring rheumatological evaluation.   Seeing neurology for known severe periph sensory predominant polyneuropathy recently started physical therapy course [facility]). Recent lumbar MRI completed, hasn't been read yet. Predominant tightness feeling to bilateral lower legs.  ?RLS - he started oral iron every other day as well as lyrica 25mg  nightly which he feels has been helpful. CPK and magnesium returned normal, as did potassium. No GI side effects on oral iron.   Known 4mm supraclinoid L ICA aneurysm followed by NSG Dr Haywood Pao.  Known h/o stroke (punctate acute/subacute nonhemorrhagic infarct to L posterior lower midbrain as well as remote lacunar infarcts of R thalamus and R cerebellum 06/2022). Continues aspirin and atorvastatin.   Currently being evaluated for R CTS. He received carpal tunnel injection 09/01/2023 by Dr Sherryll Burger.   He states nephrology [facility]) increased ramipril to 10mg  BID.      Relevant past medical, surgical, family and social history reviewed and updated as indicated. Interim medical history since our last visit reviewed. Allergies and medications reviewed and updated. Outpatient Medications  Prior to Visit  Medication Sig Dispense Refill   Alpha-Lipoic Acid 600 MG TABS Take 1 tablet by mouth daily.     aspirin EC 81 MG tablet Take 1 tablet (81 mg total) by mouth daily. Swallow whole. 30 tablet 12   aspirin-acetaminophen-caffeine (EXCEDRIN MIGRAINE) 250-250-65 MG per tablet Take 1 tablet by mouth 2 (two) times daily as needed.      finasteride (PROSCAR) 5 MG tablet Take 1 tablet (5 mg total) by mouth daily. 90 tablet 3   folic acid (FOLVITE) 400 MCG tablet Take 1 tablet (0.4 mg total) by mouth daily.     Omega 3 1000 MG CAPS Take 1 capsule by mouth daily.     atorvastatin (LIPITOR) 40 MG tablet Take 1 tablet (40 mg total) by mouth at bedtime. 90 tablet 3   Cholecalciferol (VITAMIN D3) 1.25 MG (50000 UT) TABS Take 1 tablet by mouth once a week. 12 tablet 3   pregabalin (LYRICA) 25 MG capsule Take 1 capsule (25 mg total) by mouth at bedtime. 30 capsule 0   ramipril (ALTACE) 10 MG capsule Take 1 capsule (10 mg total) by mouth daily. (Patient taking differently: Take 10 mg by mouth 2 (two) times daily.) 90 capsule 3   terazosin (HYTRIN) 10 MG capsule Take 1 capsule (10 mg total) by mouth daily. 90 capsule 3   No facility-administered medications prior to visit.     Per HPI unless specifically indicated in ROS section below Review of Systems  Objective:  BP 136/82   Pulse 70  Temp 97.8 F (36.6 C) (Temporal)   Ht 5\' 11"  (1.803 m)   Wt 160 lb 4 oz (72.7 kg)   SpO2 96%   BMI 22.35 kg/m   Wt Readings from Last 3 Encounters:  09/14/23 160 lb 4 oz (72.7 kg)  06/07/23 165 lb 6 oz (75 kg)  04/28/23 166 lb 12.8 oz (75.7 kg)      Physical Exam Vitals and nursing note reviewed.  Constitutional:      Appearance: Normal appearance. He is not ill-appearing.  HENT:     Mouth/Throat:     Mouth: Mucous membranes are moist.     Pharynx: Oropharynx is clear. No oropharyngeal exudate or posterior oropharyngeal erythema.  Eyes:     Extraocular Movements: Extraocular movements intact.      Pupils: Pupils are equal, round, and reactive to light.  Cardiovascular:     Rate and Rhythm: Normal rate and regular rhythm.     Pulses: Normal pulses.     Heart sounds: Normal heart sounds. No murmur heard. Pulmonary:     Effort: Pulmonary effort is normal. No respiratory distress.     Breath sounds: Normal breath sounds. No wheezing, rhonchi or rales.  Musculoskeletal:     Right lower leg: No edema.     Left lower leg: No edema.     Comments:  Wearing R wrist brace and R ankle air cast brace   Skin:    General: Skin is warm and dry.     Findings: No rash.  Neurological:     Mental Status: He is alert.  Psychiatric:        Mood and Affect: Mood normal.        Behavior: Behavior normal.       Results for orders placed or performed in visit on 06/07/23  Ferritin  Result Value Ref Range   Ferritin 19.1 (L) 22.0 - 322.0 ng/mL  IBC panel  Result Value Ref Range   Iron 65 42 - 165 ug/dL   Transferrin 213.0 (L) 212.0 - 360.0 mg/dL   Saturation Ratios 86.5 20.0 - 50.0 %   TIBC 222.6 (L) 250.0 - 450.0 mcg/dL  CK  Result Value Ref Range   Total CK 51 7 - 232 U/L  Magnesium  Result Value Ref Range   Magnesium 2.1 1.5 - 2.5 mg/dL    Assessment & Plan:   Problem List Items Addressed This Visit     Seizure disorder [facility])    Appreciate neurology care. Stable period off dilantin.      Relevant Medications   pregabalin (LYRICA) 50 MG capsule   Vitamin D deficiency    Continue weekly vit d replacement.       Peripheral neuropathy - Primary    Chronic, severe, predominant sensory.  Appreciate neurology care.  He feels Lyrica has been helpful - will increase from 25 to 50mg  at night time.  He is starting PT course as well Recent lumbar MRI is pending.       Relevant Medications   pregabalin (LYRICA) 50 MG capsule   Nephrotic range proteinuria    Appreciate renal care - now on BID ACEI.       Spinal stenosis of lumbar region with neurogenic claudication     Pending lumbar MRI read by radiology.       Relevant Medications   pregabalin (LYRICA) 50 MG capsule   Bilateral leg pain    ?RLS component.  Continues oral iron every other day. Will update iron  panel next labwork.         Meds ordered this encounter  Medications   Iron, Ferrous Sulfate, 325 (65 Fe) MG TABS    Sig: Take 325 mg by mouth every Monday, Wednesday, and Friday.   pregabalin (LYRICA) 50 MG capsule    Sig: Take 1 capsule (50 mg total) by mouth at bedtime.    Dispense:  30 capsule    Refill:  5   Cholecalciferol (VITAMIN D3) 1.25 MG (50000 UT) TABS    Sig: Take 1 tablet by mouth once a week.    Dispense:  12 tablet    Refill:  1   ramipril (ALTACE) 10 MG capsule    Sig: Take 1 capsule (10 mg total) by mouth 2 (two) times daily.    Dispense:  180 capsule    Refill:  1   terazosin (HYTRIN) 10 MG capsule    Sig: Take 1 capsule (10 mg total) by mouth daily.    Dispense:  90 capsule    Refill:  1   atorvastatin (LIPITOR) 40 MG tablet    Sig: Take 1 tablet (40 mg total) by mouth at bedtime.    Dispense:  90 tablet    Refill:  1    No orders of the defined types were placed in this encounter.   Patient Instructions  Increase pregabalin (Lyrica) to 50mg  nightly  Continue other medicines including weekly vitamin D3 - refilled to [facility] pharmacy. Return in 2-3 months for physical/wellness visit.   Follow up plan: Return in about 2 months (around 11/14/2023), or if symptoms worsen or fail to improve, for annual exam, prior fasting for blood work, medicare wellness visit.  Eustaquio Boyden, MD

## 2023-09-14 NOTE — Assessment & Plan Note (Signed)
Pending lumbar MRI read by radiology.

## 2023-09-14 NOTE — Addendum Note (Signed)
Addended by: Eustaquio Boyden on: 09/14/2023 08:54 AM   Modules accepted: Level of Service

## 2023-09-14 NOTE — Assessment & Plan Note (Signed)
Appreciate renal care - now on BID ACEI.

## 2023-09-14 NOTE — Assessment & Plan Note (Signed)
?  RLS component.  Continues oral iron every other day. Will update iron panel next labwork.

## 2023-09-14 NOTE — Assessment & Plan Note (Signed)
Appreciate neurology care. Stable period off dilantin.

## 2023-09-14 NOTE — Patient Instructions (Addendum)
Increase pregabalin (Lyrica) to 50mg  nightly  Continue other medicines including weekly vitamin D3 - refilled to [facility] pharmacy. Return in 2-3 months for physical/wellness visit.

## 2023-09-14 NOTE — Assessment & Plan Note (Signed)
Continue weekly vit d replacement.

## 2023-09-14 NOTE — Assessment & Plan Note (Signed)
Chronic, severe, predominant sensory.  Appreciate neurology care.  He feels Lyrica has been helpful - will increase from 25 to 50mg  at night time.  He is starting PT course as well Recent lumbar MRI is pending.

## 2023-09-15 ENCOUNTER — Ambulatory Visit: Payer: Medicare Other | Attending: Student | Admitting: Physical Therapy

## 2023-09-15 ENCOUNTER — Encounter: Payer: Self-pay | Admitting: Physical Therapy

## 2023-09-15 DIAGNOSIS — M25511 Pain in right shoulder: Secondary | ICD-10-CM | POA: Insufficient documentation

## 2023-09-15 DIAGNOSIS — R2689 Other abnormalities of gait and mobility: Secondary | ICD-10-CM | POA: Insufficient documentation

## 2023-09-15 DIAGNOSIS — M792 Neuralgia and neuritis, unspecified: Secondary | ICD-10-CM | POA: Insufficient documentation

## 2023-09-15 DIAGNOSIS — G8929 Other chronic pain: Secondary | ICD-10-CM | POA: Insufficient documentation

## 2023-09-15 DIAGNOSIS — M6281 Muscle weakness (generalized): Secondary | ICD-10-CM | POA: Diagnosis present

## 2023-09-16 NOTE — Therapy (Signed)
OUTPATIENT PHYSICAL THERAPY TREATMENT   Patient Name: Mark Holmes MRN: [id number] DOB:[birth year], 88 y.o., male Today's Date: 09/20/2023  END OF SESSION:  PT End of Session - 09/20/23 1020     Visit Number 2    Number of Visits 13    Date for PT Re-Evaluation 12/08/23    Authorization Type [employer] MEDICARE reporting period from 09/15/2023    Authorization Time Period Auth 16 visits 9/25-11/20  auth# [id number]    Authorization - Visit Number 1    Authorization - Number of Visits 16    Progress Note Due on Visit 10    PT Start Time 1026    PT Stop Time 1106    PT Time Calculation (min) 40 min    Behavior During Therapy [facility] for tasks assessed/performed              Past Medical History:  Diagnosis Date   Benign prostatic hypertrophy with elevated PSA    per Dr. Achilles Dunk   History of CT scan of brain 1992   normal   Hyperglycemia 01/2006   102   Hyperlipemia    Hypertension    Seizure disorder [facility])    Past Surgical History:  Procedure Laterality Date   ANTERIOR CERVICAL DECOMP/DISCECTOMY FUSION  05/2018   for myelopathy -C3/4 ([facility])   CARPAL TUNNEL RELEASE Right 06/2019   ([facility])   CATARACT EXTRACTION W/ INTRAOCULAR LENS IMPLANT Right 2012   [place] [facility]   CATARACT EXTRACTION W/PHACO Left 01/14/2022   Procedure: CATARACT EXTRACTION PHACO AND INTRAOCULAR LENS PLACEMENT (IOC) LEFT;  Surgeon: Lockie Mola, MD;  Location: [facility];  Service: Ophthalmology;  Laterality: Left;  7.55 01:03.6   History of EEG  1985   normal   hospitalization  1981   observation for seizuare   TONSILLECTOMY     TRANSURETHRAL RESECTION OF PROSTATE  10/2005   [facility]   TRANSURETHRAL RESECTION OF PROSTATE  08/2017   (rpt) [facility]   Patient Active Problem List   Diagnosis Date Noted   Bilateral leg pain 06/08/2023   Right shoulder pain 06/08/2023   Spinal stenosis of lumbar region with neurogenic claudication 03/04/2023   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   Bilateral hearing loss 06/30/2022   Brain aneurysm 06/30/2022   History of ischemic stroke 06/20/2022   Nephrotic range proteinuria 06/04/2022   Hypoproteinemia (HCC) 06/01/2022   Atypical chest pain 07/10/2020   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   Unsteadiness on feet 11/08/2019   History of spinal surgery 01/17/2019   Neck pain 01/17/2019   Weakness of hand 01/17/2019   BPH (benign prostatic hyperplasia) 01/17/2019   Systolic murmur 07/04/2018   Right carpal tunnel syndrome 02/02/2018   Ulnar neuropathy at elbow, left 02/02/2018   Peripheral neuropathy 01/26/2018   Bilateral buttock pain 06/29/2016   Failed vision screen 06/29/2016   Advanced care planning/counseling discussion 06/21/2015   Medicare annual wellness visit, subsequent 06/18/2014   Health maintenance examination 06/18/2014   Chronic ankle pain 06/18/2014   Incomplete emptying of bladder 06/07/2013   Vitamin D deficiency 05/20/2013   THYROID NODULE 05/17/2007   Mixed hyperlipidemia 05/17/2007   Essential tremor 05/17/2007   Essential hypertension 05/17/2007   Cervical spondylosis with myelopathy and radiculopathy 05/17/2007   Seizure disorder (HCC) 05/17/2007    PCP: Eustaquio Boyden, MD  REFERRING PROVIDER:  Janice Coffin, PA-C (Neurology)  REFERRING DIAG: neuropathy, right shoulder pain  THERAPY DIAG:  Chronic right shoulder pain  Muscle weakness (generalized)  Other abnormalities of  gait and mobility  Neuralgia and neuritis  Rationale for Evaluation and Treatment: Rehabilitation  ONSET DATE: right shoulder pain end of April 2024, neuropathy symptoms chronic  PERTINENT HISTORY: Patient    SUBJECTIVE:                                                                                                                                                                                      SUBJECTIVE STATEMENT: Patient arrives with [facility] and air cast at right ankle. He states he had difficulty  lifting his right UE to put it on the table for a while after last PT session  PAIN:  NPRS: 0/10 but has numbness in B lower legs.   PATIENT GOALS: "to be able to use my right shoulder to better facilitate my exercising"   NEXT MD VISIT:   OBJECTIVE  Vitals:    09/20/23 1031  BP: (!) 150/86 manual cuff  Pulse: 81  SpO2: 96%     FUNCTIONAL/BALANCE TESTS:   [facility] PT Assessment - 09/20/23 0001       Functional Gait  Assessment   Gait assessed  Yes   no AD used during test   Gait Level Surface Walks 20 ft in less than 5.5 sec, no assistive devices, good speed, no evidence for imbalance, normal gait pattern, deviates no more than 6 in outside of the 12 in walkway width.   self-selected: 6.56 seconds; fast: 4.16 seconds   Change in Gait Speed Able to smoothly change walking speed without loss of balance or gait deviation. Deviate no more than 6 in outside of the 12 in walkway width.    Gait with Horizontal Head Turns Performs head turns smoothly with slight change in gait velocity (eg, minor disruption to smooth gait path), deviates 6-10 in outside 12 in walkway width, or uses an assistive device.    Gait with Vertical Head Turns Performs task with slight change in gait velocity (eg, minor disruption to smooth gait path), deviates 6 - 10 in outside 12 in walkway width or uses assistive device    Gait and Pivot Turn Pivot turns safely within 3 sec and stops quickly with no loss of balance.    Step Over Obstacle Is able to step over 2 stacked shoe boxes taped together (9 in total height) without changing gait speed. No evidence of imbalance.    Gait with Narrow Base of Support Ambulates less than 4 steps heel to toe or cannot perform without assistance.    Gait with Eyes Closed Walks 20 ft, slow speed, abnormal gait pattern, evidence for imbalance, deviates 10-15 in outside 12 in walkway width. Requires more than 9 sec  to ambulate 20 ft.   11 seconds, staggaring gait with wide base of support  at times.   Ambulating Backwards Walks 20 ft, uses assistive device, slower speed, mild gait deviations, deviates 6-10 in outside 12 in walkway width.   15 seconds   Steps Alternating feet, must use rail.    Total Score 21    FGA comment: 19-24 = medium risk fall              TODAY'S TREATMENT:   Therapeutic exercise: to centralize symptoms and improve ROM, strength, muscular endurance, and activity tolerance required for successful completion of functional activities.  - standing R shoulder ER with towel roll under his arm, 3x15 green/blue/blue TB (black too difficult).  - standing L shoulder ER with towel roll under his arm, 3x15 green/red/red TB (green too difficult).  - Education on HEP including handout   Neuromuscular Re-education: to improve, balance, postural strength, muscle activation patterns, and stabilization strength required for functional activities: - Functional Gait Assessment testing (see above)  PATIENT EDUCATION: Education details: Exercise purpose/form. Self management techniques. Education on diagnosis, prognosis, POC, anatomy and physiology of current condition. Education on HEP including handout. Person educated: Patient Education method: Explanation and Verbal cues Education comprehension: verbalized understanding and needs further education  HOME EXERCISE PROGRAM: Access Code: [email or web address] URL: [email or web address] Date: 09/20/2023 Prepared by: Norton Blizzard  Exercises - Shoulder External Rotation with Anchored Resistance  - 1 x daily - 3 sets - 15 reps  ASSESSMENT:  CLINICAL IMPRESSION: Patient arrives reporting increased weakness in the right shoulder following initial evaluation. Functional Gait Assessment completed an patient scored 21/30 putting him in the medium fall risk classification. Initiated shoulder rotator cuff specific strengthening with good carry over after cuing. Patient provided with timer to use at home to make HEP more  accessible. Patient would benefit from continued management of limiting condition by skilled physical therapist to address remaining impairments and functional limitations to work towards stated goals and return to PLOF or maximal functional independence.    From initial PT evaluation 09/15/2023:  Patient is a 88 y.o. male referred to outpatient physical therapy with a medical diagnosis of neuropathy, right shoulder pain who presents with signs and symptoms consistent with right shoulder pain and weakness consistent with likely full tear of right supraspinatus and difficulty with balance and mobility related to his chronic peripheral neuropathy. Today's evaluation focused on the right shoulder pain and dysfunction. Patient educated on the role of PT with a likely full suprapsinatus tear, including low chance of recovering active or resistive shoulder abduction but possible improvement in pain and function below shoulder height. Patient recommended to see an orthopedist for further diagnostic work up an assessment as desired. Plan to focus physical therapy on strengthening the shoulder girdle and functional motions a tolerated an needed to maximize function and quality of life. Patient's unsteadiness and difficulty with mobility will be assessed and addressed a appropriate in the future. Patient presents with significant pain, muscle performance, (power/endurance/strength), motor control, balance, gait, joint stability, and activity tolerance impairments that are limiting ability to complete  anything that requires use of right UE, especially raising arm laterally, dressing, walking the dog, keeping his balance, sleeping, getting up and moving around, household and community mobility, going out in the community, housework, gardening, going to the store without difficulty. Patient will benefit from skilled physical therapy intervention to address current body structure impairments and activity limitations to  improve function and work towards goals set in current  POC in order to return to prior level of function or maximal functional improvement.   OBJECTIVE IMPAIRMENTS: Abnormal gait, decreased activity tolerance, decreased balance, decreased coordination, decreased endurance, decreased knowledge of condition, decreased mobility, difficulty walking, decreased ROM, decreased strength, impaired perceived functional ability, increased muscle spasms, impaired sensation, impaired UE functional use, improper body mechanics, postural dysfunction, and pain.   ACTIVITY LIMITATIONS: carrying, lifting, bending, sitting, standing, squatting, sleeping, stairs, transfers, bathing, dressing, reach over head, hygiene/grooming, locomotion level, and caring for others  PARTICIPATION LIMITATIONS: laundry, interpersonal relationship, shopping, community activity, yard work, and   difficulty with anything that requires use of right UE, especially raising arm laterally, dressing, walking the dog, keeping his balance, sleeping, getting up and moving around, household and community mobility, going out in the community, housework, gardening, going to the store  PERSONAL FACTORS: Age, Behavior pattern, Fitness, Past/current experiences, Time since onset of injury/illness/exacerbation, and 3+ comorbidities:   CVA on 06/20/2022 (MRI showed punctate acute/subacute nonhemorrhagic infarct in the posterior left lower midbrain near the 4th cranial nerve nucleus with remote lacunar infarcts of the right thalamus and right cerebellum),  4mm PCOM aneurysm (found incidentally on CTA 06/20/2022, currently undergoing conservative monitoring), concern over dilantin related neuropathy (starting to wean off), systolic murmur, benign prostatic hyperplasia with urinary obstruction (followed by urology), chronic R ankle pain (likely due to post traumatic arthritis), seizure disorder (controlled over many years with dilantin), low back pain, ACDR for myelopathy  at C3-C4, 05/2018), cataract surgeries (bilateral), right Carpal tunnel release (06/2019), symptoms of peripheral neuropathy at all limbs (fine motor skills in B UE affected), perfuse degenerative changes on lumbar and cervical spine imaging (see chart for details), chronic kidney disease (concern over it being related to dilantin, followed by nephrology) are also affecting patient's functional outcome.   REHAB POTENTIAL: Fair due to testing suggesting full thickness R supraspinatus tear, and balance/mobility symptoms responding incompletely to PT in the past  CLINICAL DECISION MAKING: Evolving/moderate complexity  EVALUATION COMPLEXITY: Moderate   GOALS: Goals reviewed with patient? No  SHORT TERM GOALS: Target date: 09/29/2023  Patient will be independent with initial home exercise program for self-management of symptoms. Baseline: Initial HEP to be provided at visit 2 as appropriate (09/15/23); Goal status: In-progress   LONG TERM GOALS: Target date: 12/08/2023  Patient will be independent with a long-term home exercise program for self-management of symptoms.  Baseline: Initial HEP to be provided at visit 2 as appropriate (09/15/23); Goal status: In-progress  2.  Patient will demonstrate improved shoulder FOTO to equal or greater than 63 by visit #11 to demonstrate improvement in overall condition and self-reported functional ability.  Baseline: 54 (09/15/23); Goal status: In-progress  3.  Patient will report the ability to sleep on his right shoulder without being awoken by right shoulder pain.  Baseline: cannot sleep on the right side (09/15/23); Goal status: In-progress  4.  Patient will score equal or greater than 25/30 on Functional Gait Assessment to demonstrate low fall risk.  Baseline: to be tested at visit 2 as appropriate (09/15/23); Goal status: In-progress  5.  Patient will complete community, work and/or recreational activities with 50% less limitation due to  current condition.  Baseline: difficulty with anything that requires use of right UE, especially raising arm laterally, dressing, walking the dog, keeping his balance, sleeping, getting up and moving around, household and community mobility, going out in the community, housework, gardening, going to the store (09/15/23); Goal status: In-progress   PLAN:  PT FREQUENCY: 1-2x/week  PT DURATION: 12 weeks  PLANNED INTERVENTIONS: Therapeutic exercises, Therapeutic activity, Neuromuscular re-education, Balance training, Gait training, Patient/Family education, Self Care, Joint mobilization, Dry Needling, Electrical stimulation, Spinal mobilization, Cryotherapy, Moist heat, Manual therapy, and Re-evaluation  PLAN FOR NEXT SESSION: update HEP as appropriate, progressive UE/LE/functional strengthening/motor control/balance exercises as tolerated, manual therapy as needed, education.    Cira Rue, PT, DPT 09/20/2023, 1:05 PM   [facility] [address] [place], [place] [address] P: [phone] I F: [phone]

## 2023-09-20 ENCOUNTER — Ambulatory Visit: Payer: Medicare Other | Admitting: Physical Therapy

## 2023-09-20 ENCOUNTER — Encounter: Payer: Self-pay | Admitting: Physical Therapy

## 2023-09-20 VITALS — BP 150/86 | HR 81

## 2023-09-20 DIAGNOSIS — R2689 Other abnormalities of gait and mobility: Secondary | ICD-10-CM

## 2023-09-20 DIAGNOSIS — G8929 Other chronic pain: Secondary | ICD-10-CM

## 2023-09-20 DIAGNOSIS — M6281 Muscle weakness (generalized): Secondary | ICD-10-CM

## 2023-09-20 DIAGNOSIS — M25511 Pain in right shoulder: Secondary | ICD-10-CM | POA: Diagnosis not present

## 2023-09-20 DIAGNOSIS — M792 Neuralgia and neuritis, unspecified: Secondary | ICD-10-CM

## 2023-09-27 ENCOUNTER — Ambulatory Visit: Payer: Medicare Other | Admitting: Physical Therapy

## 2023-09-28 ENCOUNTER — Encounter: Payer: Self-pay | Admitting: Physical Therapy

## 2023-09-28 ENCOUNTER — Ambulatory Visit: Payer: Medicare Other | Attending: Student | Admitting: Physical Therapy

## 2023-09-28 DIAGNOSIS — G8929 Other chronic pain: Secondary | ICD-10-CM | POA: Insufficient documentation

## 2023-09-28 DIAGNOSIS — M6281 Muscle weakness (generalized): Secondary | ICD-10-CM | POA: Insufficient documentation

## 2023-09-28 DIAGNOSIS — R2689 Other abnormalities of gait and mobility: Secondary | ICD-10-CM | POA: Insufficient documentation

## 2023-09-28 DIAGNOSIS — M792 Neuralgia and neuritis, unspecified: Secondary | ICD-10-CM | POA: Insufficient documentation

## 2023-09-28 DIAGNOSIS — M25511 Pain in right shoulder: Secondary | ICD-10-CM | POA: Insufficient documentation

## 2023-09-28 NOTE — Therapy (Signed)
OUTPATIENT PHYSICAL THERAPY TREATMENT   Patient Name: Mark Holmes MRN: [id number] DOB:11/09/1935, 88 y.o., male Today's Date: 09/28/2023  END OF SESSION:  PT End of Session - 09/28/23 1743     Visit Number 3    Number of Visits 13    Date for PT Re-Evaluation 12/08/23    Authorization Type [employer] MEDICARE reporting period from 09/15/2023    Authorization Time Period Auth 16 visits 9/25-11/20  auth# [id number]    Authorization - Visit Number 2    Authorization - Number of Visits 16    Progress Note Due on Visit 10    PT Start Time 1737    PT Stop Time 1815    PT Time Calculation (min) 38 min    Behavior During Therapy [facility] for tasks assessed/performed               Past Medical History:  Diagnosis Date   Benign prostatic hypertrophy with elevated PSA    per Dr. Achilles Dunk   History of CT scan of brain 1992   normal   Hyperglycemia 01/2006   102   Hyperlipemia    Hypertension    Seizure disorder [facility])    Past Surgical History:  Procedure Laterality Date   ANTERIOR CERVICAL DECOMP/DISCECTOMY FUSION  05/2018   for myelopathy -C3/4 ([facility])   CARPAL TUNNEL RELEASE Right 06/2019   ([facility])   CATARACT EXTRACTION W/ INTRAOCULAR LENS IMPLANT Right 2012   [place] [facility]   CATARACT EXTRACTION W/PHACO Left 01/14/2022   Procedure: CATARACT EXTRACTION PHACO AND INTRAOCULAR LENS PLACEMENT (IOC) LEFT;  Surgeon: Lockie Mola, MD;  Location: [facility];  Service: Ophthalmology;  Laterality: Left;  7.55 01:03.6   History of EEG  1985   normal   hospitalization  1981   observation for seizuare   TONSILLECTOMY     TRANSURETHRAL RESECTION OF PROSTATE  10/2005   [facility]   TRANSURETHRAL RESECTION OF PROSTATE  08/2017   (rpt) [facility]   Patient Active Problem List   Diagnosis Date Noted   Bilateral leg pain 06/08/2023   Right shoulder pain 06/08/2023   Spinal stenosis of lumbar region with neurogenic claudication 03/04/2023   Membranous  glomerulonephritis 11/30/2022   Bilateral hearing loss 06/30/2022   Brain aneurysm 06/30/2022   History of ischemic stroke 06/20/2022   Nephrotic range proteinuria 06/04/2022   Hypoproteinemia (HCC) 06/01/2022   Atypical chest pain 07/10/2020   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   Unsteadiness on feet 11/08/2019   History of spinal surgery 01/17/2019   Neck pain 01/17/2019   Weakness of hand 01/17/2019   BPH (benign prostatic hyperplasia) 01/17/2019   Systolic murmur 07/04/2018   Right carpal tunnel syndrome 02/02/2018   Ulnar neuropathy at elbow, left 02/02/2018   Peripheral neuropathy 01/26/2018   Bilateral buttock pain 06/29/2016   Failed vision screen 06/29/2016   Advanced care planning/counseling discussion 06/21/2015   Medicare annual wellness visit, subsequent 06/18/2014   Health maintenance examination 06/18/2014   Chronic ankle pain 06/18/2014   Incomplete emptying of bladder 06/07/2013   Vitamin D deficiency 05/20/2013   THYROID NODULE 05/17/2007   Mixed hyperlipidemia 05/17/2007   Essential tremor 05/17/2007   Essential hypertension 05/17/2007   Cervical spondylosis with myelopathy and radiculopathy 05/17/2007   Seizure disorder (HCC) 05/17/2007    PCP: Eustaquio Boyden, MD  REFERRING PROVIDER:  Janice Coffin, PA-C (Neurology)  REFERRING DIAG: neuropathy, right shoulder pain  THERAPY DIAG:  Chronic right shoulder pain  Muscle weakness (generalized)  Other abnormalities  of gait and mobility  Neuralgia and neuritis  Rationale for Evaluation and Treatment: Rehabilitation  ONSET DATE: right shoulder pain end of April 2024, neuropathy symptoms chronic  PERTINENT HISTORY: Patient    SUBJECTIVE:                                                                                                                                                                                      SUBJECTIVE STATEMENT: Patient arrives with [facility] and air cast at right ankle. HE denies  pain or falls. He states his shoulder has been feeling better, especially at night even though he lays on his side. He is still having some trouble getting to the switch on the lamp. He really likes using the timer the PT gave him last session.   PAIN:  NPRS: 0/10  PATIENT GOALS: "to be able to use my right shoulder to better facilitate my exercising"   NEXT MD VISIT:   OBJECTIVE  TODAY'S TREATMENT:   Therapeutic exercise: to centralize symptoms and improve ROM, strength, muscular endurance, and activity tolerance required for successful completion of functional activities.  - NuStep level 5 using bilateral upper and lower extremities. Seat/handle setting 10/11. For improved extremity mobility, muscular endurance, and activity tolerance; and to induce the analgesic effect of aerobic exercise, stimulate improved joint nutrition, and prepare body structures and systems for following interventions. x 5  minutes. Average SPM = 80. - standing R shoulder ER with towel roll under his arm, 2x15 blue TB, good carry over - standing L shoulder ER with towel roll under his arm, 1x15 red TB, good carry over - seated chest press with plus, 3x15/15/14 at 5# - standing ball bounces on the wall with 1kg med ball, in front of body and out to the side with elbow below shoulder, 3x30 R shoulder.  - Education on HEP including handout   PATIENT EDUCATION: Education details: Exercise purpose/form. Self management techniques. Education on diagnosis, prognosis, POC, anatomy and physiology of current condition. Education on HEP including handout. Person educated: Patient Education method: Explanation and Verbal cues Education comprehension: verbalized understanding and needs further education  HOME EXERCISE PROGRAM: Access Code: [email or web address] URL: [email or web address] Date: 09/28/2023 Prepared by: Norton Blizzard  Exercises - Shoulder External Rotation with Anchored Resistance  - 1 x daily - 3 sets - 15  reps - Forearm Walks on Wall with Resistance Band  - 3-5 x weekly - 2-3 sets - 8-10 reps  ASSESSMENT:  CLINICAL IMPRESSION: Patient arrives reporting improved sleep due to decreased shoulder pain and good tolerance to HEP. Today's session reviewed form for HEP (good carry over), and progressed shoulder strength/stabilization exercises as tolerated. Patient  demonstrated significant fatigue by end of session but no increase in pain. HEP updated to include well tolerated wall walking exercise. Patient would benefit from continued management of limiting condition by skilled physical therapist to address remaining impairments and functional limitations to work towards stated goals and return to PLOF or maximal functional independence.   From initial PT evaluation 09/15/2023:  Patient is a 88 y.o. male referred to outpatient physical therapy with a medical diagnosis of neuropathy, right shoulder pain who presents with signs and symptoms consistent with right shoulder pain and weakness consistent with likely full tear of right supraspinatus and difficulty with balance and mobility related to his chronic peripheral neuropathy. Today's evaluation focused on the right shoulder pain and dysfunction. Patient educated on the role of PT with a likely full suprapsinatus tear, including low chance of recovering active or resistive shoulder abduction but possible improvement in pain and function below shoulder height. Patient recommended to see an orthopedist for further diagnostic work up an assessment as desired. Plan to focus physical therapy on strengthening the shoulder girdle and functional motions a tolerated an needed to maximize function and quality of life. Patient's unsteadiness and difficulty with mobility will be assessed and addressed a appropriate in the future. Patient presents with significant pain, muscle performance, (power/endurance/strength), motor control, balance, gait, joint stability, and activity  tolerance impairments that are limiting ability to complete  anything that requires use of right UE, especially raising arm laterally, dressing, walking the dog, keeping his balance, sleeping, getting up and moving around, household and community mobility, going out in the community, housework, gardening, going to the store without difficulty. Patient will benefit from skilled physical therapy intervention to address current body structure impairments and activity limitations to improve function and work towards goals set in current POC in order to return to prior level of function or maximal functional improvement.   OBJECTIVE IMPAIRMENTS: Abnormal gait, decreased activity tolerance, decreased balance, decreased coordination, decreased endurance, decreased knowledge of condition, decreased mobility, difficulty walking, decreased ROM, decreased strength, impaired perceived functional ability, increased muscle spasms, impaired sensation, impaired UE functional use, improper body mechanics, postural dysfunction, and pain.   ACTIVITY LIMITATIONS: carrying, lifting, bending, sitting, standing, squatting, sleeping, stairs, transfers, bathing, dressing, reach over head, hygiene/grooming, locomotion level, and caring for others  PARTICIPATION LIMITATIONS: laundry, interpersonal relationship, shopping, community activity, yard work, and   difficulty with anything that requires use of right UE, especially raising arm laterally, dressing, walking the dog, keeping his balance, sleeping, getting up and moving around, household and community mobility, going out in the community, housework, gardening, going to the store  PERSONAL FACTORS: Age, Behavior pattern, Fitness, Past/current experiences, Time since onset of injury/illness/exacerbation, and 3+ comorbidities:   CVA on 06/20/2022 (MRI showed punctate acute/subacute nonhemorrhagic infarct in the posterior left lower midbrain near the 4th cranial nerve nucleus with  remote lacunar infarcts of the right thalamus and right cerebellum),  4mm PCOM aneurysm (found incidentally on CTA 06/20/2022, currently undergoing conservative monitoring), concern over dilantin related neuropathy (starting to wean off), systolic murmur, benign prostatic hyperplasia with urinary obstruction (followed by urology), chronic R ankle pain (likely due to post traumatic arthritis), seizure disorder (controlled over many years with dilantin), low back pain, ACDR for myelopathy at C3-C4, 05/2018), cataract surgeries (bilateral), right Carpal tunnel release (06/2019), symptoms of peripheral neuropathy at all limbs (fine motor skills in B UE affected), perfuse degenerative changes on lumbar and cervical spine imaging (see chart for details), chronic kidney disease (concern over  it being related to dilantin, followed by nephrology) are also affecting patient's functional outcome.   REHAB POTENTIAL: Fair due to testing suggesting full thickness R supraspinatus tear, and balance/mobility symptoms responding incompletely to PT in the past  CLINICAL DECISION MAKING: Evolving/moderate complexity  EVALUATION COMPLEXITY: Moderate   GOALS: Goals reviewed with patient? No  SHORT TERM GOALS: Target date: 09/29/2023  Patient will be independent with initial home exercise program for self-management of symptoms. Baseline: Initial HEP to be provided at visit 2 as appropriate (09/15/23); Goal status: MET   LONG TERM GOALS: Target date: 12/08/2023  Patient will be independent with a long-term home exercise program for self-management of symptoms.  Baseline: Initial HEP to be provided at visit 2 as appropriate (09/15/23); patient participating well (09/28/2023);  Goal status: In-progress  2.  Patient will demonstrate improved shoulder FOTO to equal or greater than 63 by visit #11 to demonstrate improvement in overall condition and self-reported functional ability.  Baseline: 54 (09/15/23); Goal status:  In-progress  3.  Patient will report the ability to sleep on his right shoulder without being awoken by right shoulder pain.  Baseline: cannot sleep on the right side (09/15/23); Goal status: In-progress  4.  Patient will score equal or greater than 25/30 on Functional Gait Assessment to demonstrate low fall risk.  Baseline: to be tested at visit 2 as appropriate (09/15/23); Goal status: In-progress  5.  Patient will complete community, work and/or recreational activities with 50% less limitation due to current condition.  Baseline: difficulty with anything that requires use of right UE, especially raising arm laterally, dressing, walking the dog, keeping his balance, sleeping, getting up and moving around, household and community mobility, going out in the community, housework, gardening, going to the store (09/15/23); Goal status: In-progress   PLAN:  PT FREQUENCY: 1-2x/week  PT DURATION: 12 weeks  PLANNED INTERVENTIONS: Therapeutic exercises, Therapeutic activity, Neuromuscular re-education, Balance training, Gait training, Patient/Family education, Self Care, Joint mobilization, Dry Needling, Electrical stimulation, Spinal mobilization, Cryotherapy, Moist heat, Manual therapy, and Re-evaluation  PLAN FOR NEXT SESSION: update HEP as appropriate, progressive UE/LE/functional strengthening/motor control/balance exercises as tolerated, manual therapy as needed, education.    Cira Rue, PT, DPT 09/28/2023, 7:15 PM   [facility] [address] [place], [place] [address] P: [phone] I F: [phone]

## 2023-10-04 ENCOUNTER — Encounter: Payer: Self-pay | Admitting: Physical Therapy

## 2023-10-04 ENCOUNTER — Ambulatory Visit: Payer: Medicare Other | Admitting: Physical Therapy

## 2023-10-04 DIAGNOSIS — M792 Neuralgia and neuritis, unspecified: Secondary | ICD-10-CM

## 2023-10-04 DIAGNOSIS — M25511 Pain in right shoulder: Secondary | ICD-10-CM | POA: Diagnosis not present

## 2023-10-04 DIAGNOSIS — M6281 Muscle weakness (generalized): Secondary | ICD-10-CM

## 2023-10-04 DIAGNOSIS — R2689 Other abnormalities of gait and mobility: Secondary | ICD-10-CM

## 2023-10-04 DIAGNOSIS — G8929 Other chronic pain: Secondary | ICD-10-CM

## 2023-10-04 NOTE — Therapy (Signed)
OUTPATIENT PHYSICAL THERAPY TREATMENT   Patient Name: Mark Holmes MRN: [id number] DOB:1935-10-09, 88 y.o., male Today's Date: 10/04/2023  END OF SESSION:  PT End of Session - 10/04/23 1606     Visit Number 4    Number of Visits 13    Date for PT Re-Evaluation 12/08/23    Authorization Type [employer] MEDICARE reporting period from 09/15/2023    Authorization Time Period Auth 16 visits 9/25-11/20  auth# [id number]    Authorization - Visit Number 3    Authorization - Number of Visits 16    Progress Note Due on Visit 10    PT Start Time 1605    PT Stop Time 1647    PT Time Calculation (min) 42 min    Behavior During Therapy [facility] for tasks assessed/performed                Past Medical History:  Diagnosis Date   Benign prostatic hypertrophy with elevated PSA    per Dr. Achilles Dunk   History of CT scan of brain 1992   normal   Hyperglycemia 01/2006   102   Hyperlipemia    Hypertension    Seizure disorder [facility])    Past Surgical History:  Procedure Laterality Date   ANTERIOR CERVICAL DECOMP/DISCECTOMY FUSION  05/2018   for myelopathy -C3/4 ([facility])   CARPAL TUNNEL RELEASE Right 06/2019   ([facility])   CATARACT EXTRACTION W/ INTRAOCULAR LENS IMPLANT Right 2012   [facility]   CATARACT EXTRACTION W/PHACO Left 01/14/2022   Procedure: CATARACT EXTRACTION PHACO AND INTRAOCULAR LENS PLACEMENT (IOC) LEFT;  Surgeon: Lockie Mola, MD;  Location: [facility];  Service: Ophthalmology;  Laterality: Left;  7.55 01:03.6   History of EEG  1985   normal   hospitalization  1981   observation for seizuare   TONSILLECTOMY     TRANSURETHRAL RESECTION OF PROSTATE  10/2005   [facility]   TRANSURETHRAL RESECTION OF PROSTATE  08/2017   (rpt) [facility]   Patient Active Problem List   Diagnosis Date Noted   Bilateral leg pain 06/08/2023   Right shoulder pain 06/08/2023   Spinal stenosis of lumbar region with neurogenic claudication 03/04/2023   Membranous  glomerulonephritis 11/30/2022   Bilateral hearing loss 06/30/2022   Brain aneurysm 06/30/2022   History of ischemic stroke 06/20/2022   Nephrotic range proteinuria 06/04/2022   Hypoproteinemia (HCC) 06/01/2022   Atypical chest pain 07/10/2020   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   Unsteadiness on feet 11/08/2019   History of spinal surgery 01/17/2019   Neck pain 01/17/2019   Weakness of hand 01/17/2019   BPH (benign prostatic hyperplasia) 01/17/2019   Systolic murmur 07/04/2018   Right carpal tunnel syndrome 02/02/2018   Ulnar neuropathy at elbow, left 02/02/2018   Peripheral neuropathy 01/26/2018   Bilateral buttock pain 06/29/2016   Failed vision screen 06/29/2016   Advanced care planning/counseling discussion 06/21/2015   Medicare annual wellness visit, subsequent 06/18/2014   Health maintenance examination 06/18/2014   Chronic ankle pain 06/18/2014   Incomplete emptying of bladder 06/07/2013   Vitamin D deficiency 05/20/2013   THYROID NODULE 05/17/2007   Mixed hyperlipidemia 05/17/2007   Essential tremor 05/17/2007   Essential hypertension 05/17/2007   Cervical spondylosis with myelopathy and radiculopathy 05/17/2007   Seizure disorder (HCC) 05/17/2007    PCP: Eustaquio Boyden, MD  REFERRING PROVIDER:  Janice Coffin, PA-C (Neurology)  REFERRING DIAG: neuropathy, right shoulder pain  THERAPY DIAG:  Chronic right shoulder pain  Muscle weakness (generalized)  Other  abnormalities of gait and mobility  Neuralgia and neuritis  Rationale for Evaluation and Treatment: Rehabilitation  ONSET DATE: right shoulder pain end of April 2024, neuropathy symptoms chronic  PERTINENT HISTORY: Patient is a 88 y.o. male who presents to outpatient physical therapy with a referral for medical diagnosis neuropathy, right shoulder pain. This patient's chief complaints consist of right shoulder pain and weakness and ongoing difficulty with mobility due to imbalance, tightness/pain in his B  lower legs, and generalized weakness leading to the following functional deficits: difficulty with anything that requires use of right UE, especially raising arm laterally, dressing, walking the dog, keeping his balance, sleeping, getting up and moving around, household and community mobility, going out in the community, housework, gardening, going to the store. 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  Patient denies hx of cancer, lung problems, heart problems, diabetes, unexplained weight loss, unexplained changes in bowel or bladder problems, and osteoporosis.   SUBJECTIVE:                                                                                                                                                                                      SUBJECTIVE STATEMENT: Patient arrives with [facility] and air cast at right ankle. He states he had a good visit with his nephrologist, who gave him a good report. He  states he got some walking in last week because the car battery died. Patient states his right shoulder doesn't hurt, but there are certain ways he cannot move his shoulder.   PAIN:  NPRS: 0/10  PATIENT GOALS: "to be able to use my right shoulder to better facilitate my exercising"   NEXT MD VISIT:   OBJECTIVE  TODAY'S TREATMENT:   Therapeutic exercise: to centralize symptoms and improve ROM, strength, muscular endurance, and activity tolerance required for successful completion of functional activities.  -  NuStep level 6 using bilateral upper and lower extremities. Seat/handle setting 10/11. For improved extremity mobility, muscular endurance, and activity tolerance; and to induce the analgesic effect of aerobic exercise, stimulate improved joint nutrition, and prepare body structures and systems for following interventions. x 5:22  minutes. Average SPM = 74. - seated chest press with plus, 2x15 at 5# cable.  - standing shoulder extension, 3x10 each side with 5# cable.  - seated chest press with plus, 1x15 at 5# cable.  - AAROM right shoulder eccentric abduction with ranger and PT assist, 1x10 (worsening control, discontinued). Patient then able to demonstrate about 5 good reps before losing eccentric control with AROM.  - wall walks up and down wall with YTB tensioned around forearms, 3x10  PATIENT EDUCATION: Education details: Exercise purpose/form. Self management techniques. Education on diagnosis, prognosis, POC, anatomy and physiology of current condition. Education on HEP including handout. Person educated: Patient Education method: Explanation and Verbal cues Education comprehension: verbalized understanding and needs further education  HOME EXERCISE PROGRAM: Access Code: [email or web address] URL: [email or web address] Date: 09/28/2023 Prepared by: Norton Blizzard  Exercises - Shoulder External Rotation with Anchored Resistance  - 1 x daily - 3 sets - 15 reps - Forearm Walks  on Wall with Resistance Band  - 3-5 x weekly - 2-3 sets - 8-10 reps  ASSESSMENT:  CLINICAL IMPRESSION: Patient arrives reporting continued improvement in pain and good participation in HEP. Today's session continued with strengthening for the shoulder girdle. Patient continues to require PT input for correct exercise form and exercise selection. He continues to show profound weakness that correlates with large R supraspinatus tear. Patient would benefit from continued management of limiting condition by skilled physical therapist to address remaining impairments and functional limitations to work towards stated goals and return to PLOF or maximal functional independence.   From initial PT evaluation 09/15/2023:  Patient is a 88 y.o. male referred to outpatient physical therapy with a medical diagnosis of neuropathy, right shoulder pain who presents with signs and symptoms consistent with right shoulder pain and weakness consistent with likely full tear of right supraspinatus and difficulty with balance and mobility related to his chronic peripheral neuropathy. Today's evaluation focused on the right shoulder pain and dysfunction. Patient educated on the role of PT with a likely full suprapsinatus tear, including low chance of recovering active or resistive shoulder abduction but possible improvement in pain and function below shoulder height. Patient recommended to see an orthopedist for further diagnostic work up an assessment as desired. Plan to focus physical therapy on strengthening the shoulder girdle and functional motions a tolerated an needed to maximize function and quality of life. Patient's unsteadiness and difficulty with mobility will be assessed and addressed a appropriate in the future. Patient presents with significant pain, muscle performance, (power/endurance/strength), motor control, balance, gait, joint stability, and activity tolerance impairments that are limiting ability to complete   anything that requires use of right UE, especially raising arm laterally, dressing, walking the dog, keeping his balance, sleeping, getting up and moving around, household and community mobility, going out in the community, housework, gardening, going to the store without difficulty. Patient will benefit from skilled physical therapy intervention to address current body structure impairments and activity limitations to improve function and work towards goals set in current POC in order to return to prior level of function or maximal functional improvement.   OBJECTIVE IMPAIRMENTS: Abnormal gait, decreased activity tolerance, decreased balance, decreased coordination, decreased endurance, decreased knowledge of condition, decreased mobility, difficulty  walking, decreased ROM, decreased strength, impaired perceived functional ability, increased muscle spasms, impaired sensation, impaired UE functional use, improper body mechanics, postural dysfunction, and pain.   ACTIVITY LIMITATIONS: carrying, lifting, bending, sitting, standing, squatting, sleeping, stairs, transfers, bathing, dressing, reach over head, hygiene/grooming, locomotion level, and caring for others  PARTICIPATION LIMITATIONS: laundry, interpersonal relationship, shopping, community activity, yard work, and   difficulty with anything that requires use of right UE, especially raising arm laterally, dressing, walking the dog, keeping his balance, sleeping, getting up and moving around, household and community mobility, going out in the community, housework, gardening, going to the store  PERSONAL FACTORS: Age, Behavior pattern, Fitness, Past/current experiences, Time since onset of injury/illness/exacerbation, and 3+ comorbidities:   CVA on 06/20/2022 (MRI showed punctate acute/subacute nonhemorrhagic infarct in the posterior left lower midbrain near the 4th cranial nerve nucleus with remote lacunar infarcts of the right thalamus and right  cerebellum),  4mm PCOM aneurysm (found incidentally on CTA 06/20/2022, currently undergoing conservative monitoring), concern over dilantin related neuropathy (starting to wean off), systolic murmur, benign prostatic hyperplasia with urinary obstruction (followed by urology), chronic R ankle pain (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   REHAB POTENTIAL: Fair due to testing suggesting full thickness R supraspinatus tear, and balance/mobility symptoms responding incompletely to PT in the past  CLINICAL DECISION MAKING: Evolving/moderate complexity  EVALUATION COMPLEXITY: Moderate   GOALS: Goals reviewed with patient? No  SHORT TERM GOALS: Target date: 09/29/2023  Patient will be independent with initial home exercise program for self-management of symptoms. Baseline: Initial HEP to be provided at visit 2 as appropriate (09/15/23); Goal status: MET   LONG TERM GOALS: Target date: 12/08/2023  Patient will be independent with a long-term home exercise program for self-management of symptoms.  Baseline: Initial HEP to be provided at visit 2 as appropriate (09/15/23); patient participating well (09/28/2023);  Goal status: In-progress  2.  Patient will demonstrate improved shoulder FOTO to equal or greater than 63 by visit #11 to demonstrate improvement in overall condition and self-reported functional ability.  Baseline: 54 (09/15/23); Goal status: In-progress  3.  Patient will report the ability to  sleep on his right shoulder without being awoken by right shoulder pain.  Baseline: cannot sleep on the right side (09/15/23); Goal status: In-progress  4.  Patient will score equal or greater than 25/30 on Functional Gait Assessment to demonstrate low fall risk.  Baseline: to be tested at visit 2 as appropriate (09/15/23); Goal status: In-progress  5.  Patient will complete community, work and/or recreational activities with 50% less limitation due to current condition.  Baseline: difficulty with anything that requires use of right UE, especially raising arm laterally, dressing, walking the dog, keeping his balance, sleeping, getting up and moving around, household and community mobility, going out in the community, housework, gardening, going to the store (09/15/23); Goal status: In-progress   PLAN:  PT FREQUENCY: 1-2x/week  PT DURATION: 12 weeks  PLANNED INTERVENTIONS: Therapeutic exercises, Therapeutic activity, Neuromuscular re-education, Balance training, Gait training, Patient/Family education, Self Care, Joint mobilization, Dry Needling, Electrical stimulation, Spinal mobilization, Cryotherapy,  Moist heat, Manual therapy, and Re-evaluation  PLAN FOR NEXT SESSION: update HEP as appropriate, progressive UE/LE/functional strengthening/motor control/balance exercises as tolerated, manual therapy as needed, education.    Cira Rue, PT, DPT 10/04/2023, 4:47 PM   [facility] [address] [place], [place] [address] P: [phone] I F: [phone]

## 2023-10-06 ENCOUNTER — Encounter: Payer: Medicare Other | Admitting: Physical Therapy

## 2023-10-11 ENCOUNTER — Ambulatory Visit: Payer: Medicare Other | Admitting: Physical Therapy

## 2023-10-11 ENCOUNTER — Encounter: Payer: Self-pay | Admitting: Physical Therapy

## 2023-10-11 DIAGNOSIS — M25511 Pain in right shoulder: Secondary | ICD-10-CM | POA: Diagnosis not present

## 2023-10-11 DIAGNOSIS — M6281 Muscle weakness (generalized): Secondary | ICD-10-CM

## 2023-10-11 DIAGNOSIS — M792 Neuralgia and neuritis, unspecified: Secondary | ICD-10-CM

## 2023-10-11 DIAGNOSIS — G8929 Other chronic pain: Secondary | ICD-10-CM

## 2023-10-11 DIAGNOSIS — R2689 Other abnormalities of gait and mobility: Secondary | ICD-10-CM

## 2023-10-11 NOTE — Therapy (Unsigned)
OUTPATIENT PHYSICAL THERAPY TREATMENT   Patient Name: KUSHAL MAGOON MRN: [id number] DOB:May 10, 1935, 88 y.o., male Today's Date: 10/11/2023  END OF SESSION:  PT End of Session - 10/11/23 1649     Visit Number 5    Number of Visits 13    Date for PT Re-Evaluation 12/08/23    Authorization Type [employer] MEDICARE reporting period from 09/15/2023    Authorization Time Period Auth 16 visits 9/25-11/20  auth# [id number]    Authorization - Visit Number 5    Authorization - Number of Visits 16    Progress Note Due on Visit 10    PT Start Time 1610    PT Stop Time 1650    PT Time Calculation (min) 40 min    Behavior During Therapy [facility] for tasks assessed/performed                 Past Medical History:  Diagnosis Date   Benign prostatic hypertrophy with elevated PSA    per Dr. Achilles Dunk   History of CT scan of brain 1992   normal   Hyperglycemia 01/2006   102   Hyperlipemia    Hypertension    Seizure disorder [facility])    Past Surgical History:  Procedure Laterality Date   ANTERIOR CERVICAL DECOMP/DISCECTOMY FUSION  05/2018   for myelopathy -C3/4 ([facility])   CARPAL TUNNEL RELEASE Right 06/2019   ([facility])   CATARACT EXTRACTION W/ INTRAOCULAR LENS IMPLANT Right 2012   [facility]   CATARACT EXTRACTION W/PHACO Left 01/14/2022   Procedure: CATARACT EXTRACTION PHACO AND INTRAOCULAR LENS PLACEMENT (IOC) LEFT;  Surgeon: Lockie Mola, MD;  Location: [facility];  Service: Ophthalmology;  Laterality: Left;  7.55 01:03.6   History of EEG  1985   normal   hospitalization  1981   observation for seizuare   TONSILLECTOMY     TRANSURETHRAL RESECTION OF PROSTATE  10/2005   [facility]   TRANSURETHRAL RESECTION OF PROSTATE  08/2017   (rpt) [facility]   Patient Active Problem List   Diagnosis Date Noted   Bilateral leg pain 06/08/2023   Right shoulder pain 06/08/2023   Spinal stenosis of lumbar region with neurogenic claudication 03/04/2023   Membranous  glomerulonephritis 11/30/2022   Bilateral hearing loss 06/30/2022   Brain aneurysm 06/30/2022   History of ischemic stroke 06/20/2022   Nephrotic range proteinuria 06/04/2022   Hypoproteinemia (HCC) 06/01/2022   Atypical chest pain 07/10/2020   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   Unsteadiness on feet 11/08/2019   History of spinal surgery 01/17/2019   Neck pain 01/17/2019   Weakness of hand 01/17/2019   BPH (benign prostatic hyperplasia) 01/17/2019   Systolic murmur 07/04/2018   Right carpal tunnel syndrome 02/02/2018   Ulnar neuropathy at elbow, left 02/02/2018   Peripheral neuropathy 01/26/2018   Bilateral buttock pain 06/29/2016   Failed vision screen 06/29/2016   Advanced care planning/counseling discussion 06/21/2015   Medicare annual wellness visit, subsequent 06/18/2014   Health maintenance examination 06/18/2014   Chronic ankle pain 06/18/2014   Incomplete emptying of bladder 06/07/2013   Vitamin D deficiency 05/20/2013   THYROID NODULE 05/17/2007   Mixed hyperlipidemia 05/17/2007   Essential tremor 05/17/2007   Essential hypertension 05/17/2007   Cervical spondylosis with myelopathy and radiculopathy 05/17/2007   Seizure disorder (HCC) 05/17/2007    PCP: Eustaquio Boyden, MD  REFERRING PROVIDER:  Janice Coffin, PA-C (Neurology)  REFERRING DIAG: neuropathy, right shoulder pain  THERAPY DIAG:  Chronic right shoulder pain  Muscle weakness (generalized)  Other abnormalities of gait and mobility  Neuralgia and neuritis  Rationale for Evaluation and Treatment: Rehabilitation  ONSET DATE: right shoulder pain end of April 2024, neuropathy symptoms chronic  PERTINENT HISTORY: Patient is a 88 y.o. male who presents to outpatient physical therapy with a referral for medical diagnosis neuropathy, right shoulder pain. This patient's chief complaints consist of right shoulder pain and weakness and ongoing difficulty with mobility due to imbalance, tightness/pain in his B  lower legs, and generalized weakness leading to the following functional deficits: difficulty with anything that requires use of right UE, especially raising arm laterally, dressing, walking the dog, keeping his balance, sleeping, getting up and moving around, household and community mobility, going out in the community, housework, gardening, going to the store. 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  Patient denies hx of cancer, lung problems, heart problems, diabetes, unexplained weight loss, unexplained changes in bowel or bladder problems, and osteoporosis.   SUBJECTIVE:                                                                                                                                                                                      SUBJECTIVE STATEMENT: Patient arrives with [facility] and air cast at right ankle. He states his shoulder is doing well. He states his left knee was giving him some pain  but walking down stairs normally feels better. He states he has been doing his HEP and they have been doing well. He continues to enjoy using the timer.   PAIN:  NPRS: 0/10   PATIENT GOALS: "to be able to use my right shoulder to better facilitate my exercising"   NEXT MD VISIT:   OBJECTIVE  TODAY'S TREATMENT:   Therapeutic exercise: to centralize symptoms and improve ROM, strength, muscular endurance, and activity tolerance required for successful completion of functional activities.  -  NuStep level 6 using bilateral upper and lower extremities. Seat/handle setting 10/11. For improved extremity mobility, muscular endurance, and activity tolerance; and to induce the analgesic effect of aerobic exercise, stimulate improved joint nutrition, and prepare body structures and systems for following interventions. x 5  minutes. Average SPM = 72.  Superset: - seated chest press with plus, 3x12 at 10# cable.  - standing shoulder extension, 3x12 each side with 5# cable.   Superset:  - standing right shoulder eccentric abduction/scaption, 2x4-5 Patient then able to demonstrate about 5 good reps before losing eccentric control with AROM.   - wall walks up and down wall with YTB tensioned around forearms, 3x10  PATIENT EDUCATION: Education details: Exercise purpose/form. Self management techniques. Education on diagnosis, prognosis, POC, anatomy and physiology of current condition. Education on HEP including handout. Person educated: Patient Education method: Explanation and Verbal cues Education comprehension: verbalized understanding and needs further education  HOME EXERCISE PROGRAM: Access Code: [email or web address] URL: [email or web address] Date: 09/28/2023 Prepared by: Norton Blizzard  Exercises - Shoulder External Rotation with Anchored Resistance  - 1 x daily - 3 sets - 15 reps - Forearm Walks on Wall with Resistance Band  - 3-5 x weekly - 2-3 sets - 8-10  reps  ASSESSMENT:  CLINICAL IMPRESSION: Patient arrives reporting continued improvement in pain and good participation in HEP. Today's session continued with strengthening for the shoulder girdle. Patient continues to require PT input for correct exercise form and exercise selection. He continues to show profound weakness that correlates with large R supraspinatus tear. Patient would benefit from continued management of limiting condition by skilled physical therapist to address remaining impairments and functional limitations to work towards stated goals and return to PLOF or maximal functional independence.   From initial PT evaluation 09/15/2023:  Patient is a 88 y.o. male referred to outpatient physical therapy with a medical diagnosis of neuropathy, right shoulder pain who presents with signs and symptoms consistent with right shoulder pain and weakness consistent with likely full tear of right supraspinatus and difficulty with balance and mobility related to his chronic peripheral neuropathy. Today's evaluation focused on the right shoulder pain and dysfunction. Patient educated on the role of PT with a likely full suprapsinatus tear, including low chance of recovering active or resistive shoulder abduction but possible improvement in pain and function below shoulder height. Patient recommended to see an orthopedist for further diagnostic work up an assessment as desired. Plan to focus physical therapy on strengthening the shoulder girdle and functional motions a tolerated an needed to maximize function and quality of life. Patient's unsteadiness and difficulty with mobility will be assessed and addressed a appropriate in the future. Patient presents with significant pain, muscle performance, (power/endurance/strength), motor control, balance, gait, joint stability, and activity tolerance impairments that are limiting ability to complete  anything that requires use of right UE, especially raising arm  laterally, dressing, walking the dog, keeping his balance, sleeping, getting up and moving around, household and community mobility, going out in the community, housework, gardening, going to the store without difficulty. Patient will benefit from skilled physical therapy intervention to address current body structure impairments and activity limitations to improve function and work towards goals set in current POC in order to return to prior level of function or maximal functional improvement.   OBJECTIVE IMPAIRMENTS: Abnormal gait, decreased activity tolerance, decreased balance, decreased coordination, decreased endurance, decreased knowledge of condition, decreased mobility, difficulty walking, decreased ROM, decreased strength, impaired perceived functional ability, increased muscle spasms, impaired  sensation, impaired UE functional use, improper body mechanics, postural dysfunction, and pain.   ACTIVITY LIMITATIONS: carrying, lifting, bending, sitting, standing, squatting, sleeping, stairs, transfers, bathing, dressing, reach over head, hygiene/grooming, locomotion level, and caring for others  PARTICIPATION LIMITATIONS: laundry, interpersonal relationship, shopping, community activity, yard work, and   difficulty with anything that requires use of right UE, especially raising arm laterally, dressing, walking the dog, keeping his balance, sleeping, getting up and moving around, household and community mobility, going out in the community, housework, gardening, going to the store  PERSONAL FACTORS: Age, Behavior pattern, Fitness, Past/current experiences, Time since onset of injury/illness/exacerbation, and 3+ comorbidities:   CVA on 06/20/2022 (MRI showed punctate acute/subacute nonhemorrhagic infarct in the posterior left lower midbrain near the 4th cranial nerve nucleus with remote lacunar infarcts of the right thalamus and right cerebellum),  4mm PCOM aneurysm (found incidentally on CTA 06/20/2022,  currently undergoing conservative monitoring), concern over dilantin related neuropathy (starting to wean off), systolic murmur, benign prostatic hyperplasia with urinary obstruction (followed by urology), chronic R ankle pain (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   REHAB POTENTIAL: Fair due to testing suggesting full thickness R supraspinatus tear, and balance/mobility symptoms responding incompletely to PT in the past  CLINICAL DECISION MAKING: Evolving/moderate complexity  EVALUATION COMPLEXITY: Moderate   GOALS: Goals reviewed with patient? No  SHORT TERM GOALS: Target date: 09/29/2023  Patient will be independent with initial home exercise program for self-management of symptoms. Baseline: Initial HEP to be provided at visit 2 as appropriate (09/15/23); Goal status: MET   LONG TERM GOALS: Target date: 12/08/2023  Patient will be independent with a long-term home exercise program for self-management of symptoms.  Baseline: Initial HEP to be provided at visit 2 as appropriate (09/15/23); patient participating well (09/28/2023);  Goal status: In-progress  2.  Patient will demonstrate improved shoulder FOTO to equal or greater than 63 by visit #11 to demonstrate improvement in overall condition and self-reported functional ability.  Baseline: 54 (09/15/23); Goal status: In-progress  3.  Patient will report the ability to sleep on his right shoulder without being awoken by right shoulder pain.   Baseline: cannot sleep on the right side (09/15/23); Goal status: In-progress  4.  Patient will score equal or greater than 25/30 on Functional Gait Assessment to demonstrate low fall risk.  Baseline: to be tested at visit 2 as appropriate (09/15/23); Goal status: In-progress  5.  Patient will complete community, work and/or recreational activities with 50% less limitation due to current condition.  Baseline: difficulty with anything that requires use of right UE, especially raising arm laterally, dressing, walking the dog, keeping his balance, sleeping, getting up and moving around, household and community mobility, going out in the community, housework, gardening, going to the store (09/15/23); Goal status: In-progress   PLAN:  PT FREQUENCY: 1-2x/week  PT DURATION: 12 weeks  PLANNED INTERVENTIONS: Therapeutic exercises, Therapeutic activity, Neuromuscular re-education, Balance training, Gait training, Patient/Family education, Self Care, Joint mobilization, Dry Needling, Electrical stimulation, Spinal mobilization, Cryotherapy, Moist heat, Manual therapy, and Re-evaluation  PLAN FOR NEXT SESSION: update HEP  as appropriate, progressive UE/LE/functional strengthening/motor control/balance exercises as tolerated, manual therapy as needed, education.    Cira Rue, PT, DPT 10/11/2023, 9:03 PM   [facility] [address] [place], [place] [address] P: [phone] I F: [phone]

## 2023-10-11 NOTE — Progress Notes (Unsigned)
Referring Physician:  Janice Coffin, PA-C [address] [place],  [place] [address]  Primary Physician:  Eustaquio Boyden, MD  History of Present Illness: 10/14/2023 [name] is here today with a chief complaint of numbness in his legs particularly when he stands up.  He gets some discomfort as he stands and walks, but he is able to manage with his cane.  He has not had any falls.  He has some difficulty with his balance as his sensation is no longer normal in his legs.  His legs start to feel heavy after period of time.  Sitting too long makes his symptoms worse.  Nothing really helps too much.  He has known peripheral neuropathy that is been worsening over time.  He is treated in the [facility] for this.    Bowel/Bladder Dysfunction: none  Conservative measures:  Physical therapy: has participated in PT at [facility]  Multimodal medical therapy including regular antiinflammatories: Lyrica  Injections: 10/10/2021: Bilateral S1 transforaminal ESI  EMG done 02/02/2018 Past Surgery: ACDF at C3-4 by Dr. Waymon Budge in 2019   Mark Holmes has no symptoms of cervical myelopathy.  The symptoms are causing a significant impact on the patient's life.   I have utilized the care everywhere function in epic to review the outside records available from external health systems.  Review of Systems:  A 10 point review of systems is negative, except for the pertinent positives and negatives detailed in the HPI.  Past Medical History: Past Medical History:  Diagnosis Date   Benign prostatic hypertrophy with elevated PSA    per Dr. Achilles Dunk   History of CT scan of brain 1992   normal   Hyperglycemia 01/2006   102   Hyperlipemia    Hypertension    Seizure disorder [facility]     Past Surgical History: Past Surgical History:  Procedure Laterality Date   ANTERIOR CERVICAL DECOMP/DISCECTOMY FUSION  05/2018   for myelopathy -C3/4 ([facility])   CARPAL TUNNEL  RELEASE Right 06/2019   ([facility])   CATARACT EXTRACTION W/ INTRAOCULAR LENS IMPLANT Right 2012   [facility]   CATARACT EXTRACTION W/PHACO Left 01/14/2022   Procedure: CATARACT EXTRACTION PHACO AND INTRAOCULAR LENS PLACEMENT (IOC) LEFT;  Surgeon: Lockie Mola, MD;  Location: [facility];  Service: Ophthalmology;  Laterality: Left;  7.55 01:03.6   History of EEG  1985   normal   hospitalization  1981   observation for seizuare   TONSILLECTOMY     TRANSURETHRAL RESECTION OF PROSTATE  10/2005   [name]   TRANSURETHRAL RESECTION OF PROSTATE  08/2017   (rpt) [facility]    Allergies: Allergies as of 10/14/2023 - Review Complete 10/14/2023  Allergen Reaction Noted   Dilaudid [hydromorphone hcl] Other (See Comments) 03/13/2012   Gabapentin Other (See Comments) 06/07/2023    Medications:  Current Outpatient Medications:    Alpha-Lipoic Acid 600 MG TABS, Take 1 tablet by mouth daily., Disp: , Rfl:    aspirin EC 81 MG tablet, Take 1 tablet (81 mg total) by mouth daily. Swallow whole., Disp: 30 tablet, Rfl: 12   aspirin-acetaminophen-caffeine (EXCEDRIN MIGRAINE) 250-250-65 MG per tablet, Take 1 tablet by mouth 2 (two) times daily as needed. , Disp: , Rfl:    atorvastatin (LIPITOR) 40 MG tablet, Take 1 tablet (40 mg total) by mouth at bedtime., Disp: 90 tablet, Rfl: 1   Cholecalciferol (VITAMIN D3) 1.25 MG (50000 UT) TABS, Take 1 tablet by mouth once a week., Disp: 12  tablet, Rfl: 1   finasteride (PROSCAR) 5 MG tablet, Take 1 tablet (5 mg total) by mouth daily., Disp: 90 tablet, Rfl: 3   folic acid (FOLVITE) 400 MCG tablet, Take 1 tablet (0.4 mg total) by mouth daily., Disp: , Rfl:    Iron, Ferrous Sulfate, 325 (65 Fe) MG TABS, Take 325 mg by mouth every Monday, Wednesday, and Friday., Disp: , Rfl:    Omega 3 1000 MG CAPS, Take 1 capsule by mouth daily., Disp: , Rfl:    pregabalin (LYRICA) 50 MG capsule, Take 1 capsule (50 mg total) by mouth at bedtime., Disp: 30 capsule, Rfl: 5    ramipril (ALTACE) 10 MG capsule, Take 1 capsule (10 mg total) by mouth 2 (two) times daily., Disp: 180 capsule, Rfl: 1   terazosin (HYTRIN) 10 MG capsule, Take 1 capsule (10 mg total) by mouth daily., Disp: 90 capsule, Rfl: 1  Social History: Social History   Tobacco Use   Smoking status: Never   Smokeless tobacco: Never  Vaping Use   Vaping status: Never Used  Substance Use Topics   Alcohol use: No    Alcohol/week: 0.0 standard drinks of alcohol   Drug use: No    Family Medical History: Family History  Problem Relation Age of Onset   Febrile seizures Sister    Cancer Sister        lung/smoker   CAD Neg Hx     Physical Examination: Vitals:   10/14/23 0954  BP: (!) 148/92    General: Patient is in no apparent distress. Attention to examination is appropriate.  Neck:   Supple.  Full range of motion.  Respiratory: Patient is breathing without any difficulty.   NEUROLOGICAL:     Awake, alert, oriented to person, place, and time.  Speech is clear and fluent.   Cranial Nerves: Pupils equal round and reactive to light.  Facial tone is symmetric.  Facial sensation is symmetric. Shoulder shrug is symmetric. Tongue protrusion is midline.  There is no pronator drift.  Strength: Side Biceps Triceps Deltoid Interossei Grip Wrist Ext. Wrist Flex.  R 5 5 5 5 5 5 5   L 5 5 5 5 5 5 5    Side Iliopsoas Quads Hamstring PF DF EHL  R 5 5 5 5  4+ 5  L 5 5 5 5 5 5    Reflexes are 1+ and symmetric at the biceps, triceps, brachioradialis, patella and achilles.   Hoffman's is absent.   Bilateral upper and lower extremity sensation is symmetric to light touch.  He has slightly diminished light touch in lower extremities No evidence of dysmetria noted.  Gait requires cane.     Medical Decision Making  Imaging: MRI L spine 09/04/2023 Disc levels:   T12-L1: No significant findings.   L1-2: Bulging annulus and mild facet disease with mild bilateral lateral recess stenosis,  progressive since prior study.   L2-3: Severe degenerative disc disease and moderate facet disease with ligamentum flavum thickening. There is moderate spinal and left lateral recess stenosis and moderate left foraminal stenosis slightly progressive compared to the prior study.   L3-4: Severe degenerative disc disease and facet disease, progressive since prior study. There is also either buckling of the ligamentum flavum on right versus a complex synovial cyst. Combination of findings contributes to moderately severe spinal and bilateral lateral recess stenosis. Mild foraminal encroachment bilaterally.   L4-5: Severe degenerative disc disease and facet disease contributing to severe spinal and bilateral lateral recess stenosis. There is also severe  right foraminal stenosis and moderate left foraminal stenosis. Findings progressive when compared to the prior study.   L5-S1: Broad-based bulging degenerated and uncovered disc along with osteophytic ridging and facet disease. Mild spinal stenosis and mild bilateral lateral recess stenosis. Mild-to-moderate foraminal stenosis. This level appears relatively stable.   IMPRESSION: 1. Progressive degenerative lumbar spondylosis with multilevel disc disease and facet disease. 2. Multilevel multifactorial spinal, lateral recess and foraminal stenosis as discussed above at the individual levels. The most significant level is L4-5 where there is severe spinal and bilateral lateral recess stenosis. There is also severe right foraminal stenosis and moderate left foraminal stenosis. 3. Moderate spinal and left lateral recess stenosis and moderate left foraminal stenosis at L2-3, slightly progressive compared to the prior study. 4. Mild spinal and mild bilateral lateral recess stenosis at L5-S1. Mild-to-moderate foraminal stenosis. This level appears relatively stable.     Electronically Signed   By: Rudie Meyer M.D.   On: 09/22/2023  13:18  I have personally reviewed the images and agree with the above interpretation.  Assessment and Plan: Mr. Tuckerman is a pleasant 88 y.o. male with symptoms that seem most consistent with peripheral neuropathy.  He has some lumbar stenosis, but is not clearly claudicating.  I do not think he would benefit from lumbar decompression.  He has significant lumbar spondylosis, but I think it would not be a great idea to consider lumbar decompression as I do not think it would address his symptoms.  I did relay to him that he may be a candidate for spinal cord stimulation to try to address his diabetic neuropathy.  I have given him some information about this.  He will read it and let me know if he would like to consider moving forward.  If so, we would get a thoracic spine MRI scan and send him for psychology and pain clinic referral to get a spinal cord stimulator trial arranged.  I will see him back on an as-needed basis.  I spent a total of 30 minutes in this patient's care today. This time was spent reviewing pertinent records including imaging studies, obtaining and confirming history, performing a directed evaluation, formulating and discussing my recommendations, and documenting the visit within the medical record.      Thank you for involving me in the care of this patient.      Kade Demicco K. Myer Haff MD, [facility] Neurosurgery

## 2023-10-12 ENCOUNTER — Encounter: Payer: Medicare Other | Admitting: Physical Therapy

## 2023-10-14 ENCOUNTER — Ambulatory Visit: Payer: Medicare Other | Admitting: Neurosurgery

## 2023-10-14 ENCOUNTER — Encounter: Payer: Self-pay | Admitting: Neurosurgery

## 2023-10-14 ENCOUNTER — Encounter: Payer: Medicare Other | Admitting: Physical Therapy

## 2023-10-14 VITALS — BP 148/92 | Ht 71.0 in | Wt 164.0 lb

## 2023-10-14 DIAGNOSIS — G603 Idiopathic progressive neuropathy: Secondary | ICD-10-CM

## 2023-10-14 DIAGNOSIS — M48061 Spinal stenosis, lumbar region without neurogenic claudication: Secondary | ICD-10-CM | POA: Diagnosis not present

## 2023-10-14 DIAGNOSIS — M47816 Spondylosis without myelopathy or radiculopathy, lumbar region: Secondary | ICD-10-CM | POA: Diagnosis not present

## 2023-10-14 DIAGNOSIS — G629 Polyneuropathy, unspecified: Secondary | ICD-10-CM

## 2023-10-18 ENCOUNTER — Encounter: Payer: Medicare Other | Admitting: Physical Therapy

## 2023-10-20 ENCOUNTER — Encounter: Payer: Self-pay | Admitting: Physical Therapy

## 2023-10-20 ENCOUNTER — Ambulatory Visit: Payer: Medicare Other | Admitting: Physical Therapy

## 2023-10-20 DIAGNOSIS — G8929 Other chronic pain: Secondary | ICD-10-CM

## 2023-10-20 DIAGNOSIS — M6281 Muscle weakness (generalized): Secondary | ICD-10-CM

## 2023-10-20 DIAGNOSIS — R2689 Other abnormalities of gait and mobility: Secondary | ICD-10-CM

## 2023-10-20 DIAGNOSIS — M25511 Pain in right shoulder: Secondary | ICD-10-CM | POA: Diagnosis not present

## 2023-10-20 DIAGNOSIS — M792 Neuralgia and neuritis, unspecified: Secondary | ICD-10-CM

## 2023-10-20 NOTE — Therapy (Unsigned)
OUTPATIENT PHYSICAL THERAPY TREATMENT   Patient Name: Mark Holmes MRN: [id number] DOB:Dec 05, 1935, 88 y.o., male Today's Date: 10/21/2023  END OF SESSION:  PT End of Session - 10/20/23 1506     Visit Number 6    Number of Visits 13    Date for PT Re-Evaluation 12/08/23    Authorization Type [employer] MEDICARE reporting period from 09/15/2023    Authorization Time Period Auth 16 visits 9/25-11/20  auth# [id number]    Authorization - Visit Number 6    Authorization - Number of Visits 16    Progress Note Due on Visit 10    PT Start Time 0947    PT Stop Time 1030    PT Time Calculation (min) 43 min    Activity Tolerance Patient tolerated treatment well    Behavior During Therapy --              Past Medical History:  Diagnosis Date   Benign prostatic hypertrophy with elevated PSA    per Dr. Achilles Dunk   History of CT scan of brain 1992   normal   Hyperglycemia 01/2006   102   Hyperlipemia    Hypertension    Seizure disorder [facility])    Past Surgical History:  Procedure Laterality Date   ANTERIOR CERVICAL DECOMP/DISCECTOMY FUSION  05/2018   for myelopathy -C3/4 ([facility])   CARPAL TUNNEL RELEASE Right 06/2019   ([facility])   CATARACT EXTRACTION W/ INTRAOCULAR LENS IMPLANT Right 2012   [facility]   CATARACT EXTRACTION W/PHACO Left 01/14/2022   Procedure: CATARACT EXTRACTION PHACO AND INTRAOCULAR LENS PLACEMENT (IOC) LEFT;  Surgeon: Lockie Mola, MD;  Location: [facility];  Service: Ophthalmology;  Laterality: Left;  7.55 01:03.6   History of EEG  1985   normal   hospitalization  1981   observation for seizuare   TONSILLECTOMY     TRANSURETHRAL RESECTION OF PROSTATE  10/2005   [facility]   TRANSURETHRAL RESECTION OF PROSTATE  08/2017   (rpt) [facility]   Patient Active Problem List   Diagnosis Date Noted   Bilateral leg pain 06/08/2023   Right shoulder pain 06/08/2023   Spinal stenosis of lumbar region with neurogenic claudication 03/04/2023    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   Bilateral hearing loss 06/30/2022   Brain aneurysm 06/30/2022   History of ischemic stroke 06/20/2022   Nephrotic range proteinuria 06/04/2022   Hypoproteinemia (HCC) 06/01/2022   Atypical chest pain 07/10/2020   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   Unsteadiness on feet 11/08/2019   History of spinal surgery 01/17/2019   Neck pain 01/17/2019   Weakness of hand 01/17/2019   BPH (benign prostatic hyperplasia) 01/17/2019   Systolic murmur 07/04/2018   Right carpal tunnel syndrome 02/02/2018   Ulnar neuropathy at elbow, left 02/02/2018   Peripheral neuropathy 01/26/2018   Bilateral buttock pain 06/29/2016   Failed vision screen 06/29/2016   Advanced care planning/counseling discussion 06/21/2015   Medicare annual wellness visit, subsequent 06/18/2014   Health maintenance examination 06/18/2014   Chronic ankle pain 06/18/2014   Incomplete emptying of bladder 06/07/2013   Vitamin D deficiency 05/20/2013   THYROID NODULE 05/17/2007   Mixed hyperlipidemia 05/17/2007   Essential tremor 05/17/2007   Essential hypertension 05/17/2007   Cervical spondylosis with myelopathy and radiculopathy 05/17/2007   Seizure disorder (HCC) 05/17/2007    PCP: Eustaquio Boyden, MD  REFERRING PROVIDER:  Janice Coffin, PA-C (Neurology)  REFERRING DIAG: neuropathy, right shoulder pain  THERAPY DIAG:  Chronic right shoulder pain  Muscle  weakness (generalized)  Other abnormalities of gait and mobility  Neuralgia and neuritis  Rationale for Evaluation and Treatment: Rehabilitation  ONSET DATE: right shoulder pain end of April 2024, neuropathy symptoms chronic  PERTINENT HISTORY: Patient is a 88 y.o. male who presents to outpatient physical therapy with a referral for medical diagnosis neuropathy, right shoulder pain. This patient's chief complaints consist of right shoulder pain and weakness and ongoing difficulty with mobility due to imbalance,  tightness/pain in his B lower legs, and generalized weakness leading to the following functional deficits: difficulty with anything that requires use of right UE, especially raising arm laterally, dressing, walking the dog, keeping his balance, sleeping, getting up and moving around, household and community mobility, going out in the community, housework, gardening, going to the store. 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  Patient denies hx of cancer, lung problems, heart problems, diabetes, unexplained weight loss, unexplained changes in bowel or bladder problems, and osteoporosis.   SUBJECTIVE:                                                                                                                                                                                      SUBJECTIVE STATEMENT: Patient arrives with [facility] and air cast at right ankle. He states his right shoulder has been pretty decent. He states he  was re-laying brick at his home yesterday which was not too hard because he was on his hands and knees. He states he just has trouble with lifting his right arm into abduction. He states he saw Dr. Myer Haff (neurosurgeon) about his back who recommended that he could consider a spinal cord stimulator to help with his peripheral neuropathy but did not think he was a good candidate for back surgery at this time or that his back was contribute significantly to his leg symptoms. He also  recommended that PT would be helpful for patient's back.   PAIN:  NPRS: 0/10   PATIENT GOALS: "to be able to use my right shoulder to better facilitate my exercising"   NEXT MD VISIT:   OBJECTIVE  TODAY'S TREATMENT:   Therapeutic exercise: to centralize symptoms and improve ROM, strength, muscular endurance, and activity tolerance required for successful completion of functional activities.  - NuStep level 7 using bilateral upper and lower extremities. Seat/handle setting 10/11. For improved extremity mobility, muscular endurance, and activity tolerance; and to induce the analgesic effect of aerobic exercise, stimulate improved joint nutrition, and prepare body structures and systems for following interventions. x 5  minutes. Average SPM = 73. - sled push to improve functional serratus anterior strength/endurance with 75# sled + 50#, 3x20 feet with PT assisting with turning sled.  - sled push to improve functional serratus anterior strength/endurance with 75# sled + 90# added, 3x20 feet, 2x30 feet with PT assisting with turning sled.  - sled pull to improve functional posterior shoulder stability/strength/endurance pulling from double handled cable, 2x30 feet with 75# sled +90#. PT assists with turing sled. (Attempted from upright poles but sled tips over due to weight plate distribution).  - quadruped (airex under knees) renegade rows with focus on improving shoulder stability and serratus anterior strength/endurance.  1x10 each side with 5#DB (relatively easy load), 1x4 each side with 10# DB (too hard), 1x10 with 7#DB (good challenge). Patient required multimodal cuing for scapular position with improvement in motor control and form over duration on of exercise with good form by end.   Pt required multimodal cuing for proper technique and to facilitate improved neuromuscular control, strength, range of motion, and functional ability resulting in improved performance and form.   PATIENT EDUCATION: Education details: Exercise purpose/form. Self management techniques. Education on diagnosis, prognosis, POC, anatomy and physiology of current condition. Education on HEP including handout. Person educated: Patient Education method: Explanation and Verbal cues Education comprehension: verbalized understanding and needs further education  HOME EXERCISE PROGRAM: Access Code: [email or web address] URL: [email or web address] Date: 09/28/2023 Prepared by: Norton Blizzard  Exercises - Shoulder External Rotation with Anchored Resistance  - 1 x daily - 3 sets - 15 reps - Forearm Walks on Wall with Resistance Band  - 3-5 x weekly - 2-3 sets - 8-10 reps  ASSESSMENT:  CLINICAL IMPRESSION: Patient arrives reporting continued improvement in shoulder pain but continued difficulty with shoulder abduction. Today's session focused on exercises for improved serratus anterior strength/endurance and shoulder functional strength/stability while also challenging lower body and core for improved overall function. Patient tolerated session well without exacerbation of pain. He continues to show difficulty controlling scapular position, but this was improved by the end of quadruped renegade rows after he started to integrate cuing from PT. Patient's right shoulder pain has improved and remained stable for the last 2 weeks, and it is recommended he discharge to a long term HEP next session as it is unlikely he will get significantly more  improvement in shoulder abduction based on positive drop arm test and continued lack of control with active abduction.   From initial PT evaluation 09/15/2023:  Patient is a 88 y.o. male referred to outpatient physical therapy with a medical diagnosis of neuropathy, right shoulder pain who presents with signs and symptoms consistent with right shoulder pain and weakness consistent with likely full tear of right supraspinatus and difficulty with balance and mobility related to his chronic peripheral neuropathy. Today's evaluation focused on the right shoulder pain and  dysfunction. Patient educated on the role of PT with a likely full suprapsinatus tear, including low chance of recovering active or resistive shoulder abduction but possible improvement in pain and function below shoulder height. Patient recommended to see an orthopedist for further diagnostic work up an assessment as desired. Plan to focus physical therapy on strengthening the shoulder girdle and functional motions a tolerated an needed to maximize function and quality of life. Patient's unsteadiness and difficulty with mobility will be assessed and addressed a appropriate in the future. Patient presents with significant pain, muscle performance, (power/endurance/strength), motor control, balance, gait, joint stability, and activity tolerance impairments that are limiting ability to complete  anything that requires use of right UE, especially raising arm laterally, dressing, walking the dog, keeping his balance, sleeping, getting up and moving around, household and community mobility, going out in the community, housework, gardening, going to the store without difficulty. Patient will benefit from skilled physical therapy intervention to address current body structure impairments and activity limitations to improve function and work towards goals set in current POC in order to return to prior level of function or maximal functional improvement.    OBJECTIVE IMPAIRMENTS: Abnormal gait, decreased activity tolerance, decreased balance, decreased coordination, decreased endurance, decreased knowledge of condition, decreased mobility, difficulty walking, decreased ROM, decreased strength, impaired perceived functional ability, increased muscle spasms, impaired sensation, impaired UE functional use, improper body mechanics, postural dysfunction, and pain.   ACTIVITY LIMITATIONS: carrying, lifting, bending, sitting, standing, squatting, sleeping, stairs, transfers, bathing, dressing, reach over head, hygiene/grooming, locomotion level, and caring for others  PARTICIPATION LIMITATIONS: laundry, interpersonal relationship, shopping, community activity, yard work, and   difficulty with anything that requires use of right UE, especially raising arm laterally, dressing, walking the dog, keeping his balance, sleeping, getting up and moving around, household and community mobility, going out in the community, housework, gardening, going to the store  PERSONAL FACTORS: Age, Behavior pattern, Fitness, Past/current experiences, Time since onset of injury/illness/exacerbation, and 3+ comorbidities:   CVA on 06/20/2022 (MRI showed punctate acute/subacute nonhemorrhagic infarct in the posterior left lower midbrain near the 4th cranial nerve nucleus with remote lacunar infarcts of the right thalamus and right cerebellum),  4mm PCOM aneurysm (found incidentally on CTA 06/20/2022, currently undergoing conservative monitoring), concern over dilantin related neuropathy (starting to wean off), systolic murmur, benign prostatic hyperplasia with urinary obstruction (followed by urology), chronic R ankle pain (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   REHAB POTENTIAL: Fair due to testing suggesting full thickness R supraspinatus tear, and balance/mobility symptoms responding incompletely to PT in the past  CLINICAL DECISION MAKING: Evolving/moderate complexity  EVALUATION COMPLEXITY: Moderate   GOALS: Goals reviewed with patient? No  SHORT TERM GOALS: Target date: 09/29/2023  Patient will be independent with initial home exercise program for self-management of symptoms. Baseline: Initial HEP to be provided at visit 2 as appropriate (09/15/23); Goal status: MET   LONG TERM GOALS: Target date: 12/08/2023  Patient will be independent with a long-term home exercise program for self-management of symptoms.  Baseline: Initial HEP to be provided at visit  2 as appropriate (09/15/23); patient participating well (09/28/2023);  Goal status: In-progress  2.  Patient will demonstrate improved shoulder FOTO to equal or greater than 63 by visit #11 to demonstrate improvement in overall condition and self-reported functional ability.  Baseline: 54 (09/15/23); Goal status: In-progress  3.  Patient will report the ability to sleep on his right shoulder without being awoken by right shoulder pain.  Baseline: cannot sleep on the right side (09/15/23); Goal status: In-progress  4.  Patient will score equal or greater than 25/30 on Functional Gait Assessment to demonstrate low fall risk.  Baseline: to be tested at visit 2 as appropriate (09/15/23); Goal status: In-progress  5.  Patient will complete community, work and/or recreational activities with 50% less limitation due to current condition.  Baseline: difficulty with anything that requires use of right UE, especially raising arm  laterally, dressing, walking the dog, keeping his balance, sleeping, getting up and moving around, household and community mobility, going out in the community, housework, gardening, going to the store (09/15/23); Goal status: In-progress   PLAN:  PT FREQUENCY: 1-2x/week  PT DURATION: 12 weeks  PLANNED INTERVENTIONS: Therapeutic exercises, Therapeutic activity, Neuromuscular re-education, Balance training, Gait training, Patient/Family education, Self Care, Joint mobilization, Dry Needling, Electrical stimulation, Spinal mobilization, Cryotherapy, Moist heat, Manual therapy, and Re-evaluation  PLAN FOR NEXT SESSION: provide final long term HEP and discharge to independent management   Cira Rue, PT, DPT 10/21/2023, 5:16 PM   [facility] [address] [place], [place] [address] P: [phone] I F: [phone]

## 2023-10-21 ENCOUNTER — Ambulatory Visit: Payer: Medicare Other

## 2023-10-21 VITALS — Ht 71.0 in | Wt 165.0 lb

## 2023-10-21 DIAGNOSIS — Z0000 Encounter for general adult medical examination without abnormal findings: Secondary | ICD-10-CM

## 2023-10-21 NOTE — Progress Notes (Signed)
Subjective:   Mark Holmes is a 88 y.o. male who presents for Medicare Annual/Subsequent preventive examination.  Visit Complete: Virtual I connected with  Mark Holmes on 10/21/23 by a audio enabled telemedicine application and verified that I am speaking with the correct person using two identifiers.  Patient Location: Home  Provider Location: Home Office  I discussed the limitations of evaluation and management by telemedicine. The patient expressed understanding and agreed to proceed.  Vital Signs: Because this visit was a virtual/telehealth visit, some criteria may be missing or patient reported. Any vitals not documented were not able to be obtained and vitals that have been documented are patient reported.  Patient Medicare AWV questionnaire was completed by the patient on 10/21/2023; I have confirmed that all information answered by patient is correct and no changes since this date.  Cardiac Risk Factors include: advanced age (>56men, >75 women)     Objective:    Today's Vitals   10/21/23 1100  Weight: 165 lb (74.8 kg)  Height: 5\' 11"  (1.803 m)   Body mass index is 23.01 kg/m.     10/21/2023   11:04 AM 03/24/2023    1:33 PM 10/28/2022    8:15 AM 07/16/2022   10:07 AM 06/21/2022    5:00 PM 01/14/2022   11:26 AM 04/07/2021    9:36 AM  Advanced Directives  Does Patient Have a Medical Advance Directive? Yes Yes Yes Yes Yes Yes   Type of Estate agent of [place];Living will    Healthcare Power of [employer] of [place];Living will   Does patient want to make changes to medical advance directive?  No - Patient declined  Yes (MAU/Ambulatory/Procedural Areas - Information given) No - Patient declined No - Guardian declined Yes (MAU/Ambulatory/Procedural Areas - Information given)  Copy of Healthcare Power of Attorney in Chart? No - copy requested    No - copy requested No - copy requested     Current Medications  (verified) Outpatient Encounter Medications as of 10/21/2023  Medication Sig   Alpha-Lipoic Acid 600 MG TABS Take 1 tablet by mouth daily.   aspirin EC 81 MG tablet Take 1 tablet (81 mg total) by mouth daily. Swallow whole.   aspirin-acetaminophen-caffeine (EXCEDRIN MIGRAINE) 250-250-65 MG per tablet Take 1 tablet by mouth 2 (two) times daily as needed.    atorvastatin (LIPITOR) 40 MG tablet Take 1 tablet (40 mg total) by mouth at bedtime.   Cholecalciferol (VITAMIN D3) 1.25 MG (50000 UT) TABS Take 1 tablet by mouth once a week.   finasteride (PROSCAR) 5 MG tablet Take 1 tablet (5 mg total) by mouth daily.   folic acid (FOLVITE) 400 MCG tablet Take 1 tablet (0.4 mg total) by mouth daily.   Iron, Ferrous Sulfate, 325 (65 Fe) MG TABS Take 325 mg by mouth every Monday, Wednesday, and Friday.   Omega 3 1000 MG CAPS Take 1 capsule by mouth daily.   pregabalin (LYRICA) 50 MG capsule Take 1 capsule (50 mg total) by mouth at bedtime.   ramipril (ALTACE) 10 MG capsule Take 1 capsule (10 mg total) by mouth 2 (two) times daily.   terazosin (HYTRIN) 10 MG capsule Take 1 capsule (10 mg total) by mouth daily.   No facility-administered encounter medications on file as of 10/21/2023.    Allergies (verified) Dilaudid [hydromorphone hcl] and Gabapentin   History: Past Medical History:  Diagnosis Date   Benign prostatic hypertrophy with elevated PSA    per Dr. Achilles Dunk  History of CT scan of brain 1992   normal   Hyperglycemia 01/2006   102   Hyperlipemia    Hypertension    Seizure disorder [facility])    Past Surgical History:  Procedure Laterality Date   ANTERIOR CERVICAL DECOMP/DISCECTOMY FUSION  05/2018   for myelopathy -C3/4 ([facility])   CARPAL TUNNEL RELEASE Right 06/2019   ([facility])   CATARACT EXTRACTION W/ INTRAOCULAR LENS IMPLANT Right 2012   [facility]   CATARACT EXTRACTION W/PHACO Left 01/14/2022   Procedure: CATARACT EXTRACTION PHACO AND INTRAOCULAR LENS PLACEMENT (I[facility])  LEFT;  Surgeon: Lockie Mola, MD;  Location: [facility];  Service: Ophthalmology;  Laterality: Left;  7.55 01:03.6   History of EEG  1985   normal   hospitalization  1981   observation for seizuare   TONSILLECTOMY     TRANSURETHRAL RESECTION OF PROSTATE  10/2005   [name]   TRANSURETHRAL RESECTION OF PROSTATE  08/2017   (rpt) [facility]   Family History  Problem Relation Age of Onset   Febrile seizures Sister    Cancer Sister        lung/smoker   CAD Neg Hx    Social History   Socioeconomic History   Marital status: Married    Spouse name: Therapist, art   Number of children: 2   Years of education: Not on file   Highest education level: Master's degree (e.g., MA, MS, MEng, MEd, MSW, MBA)  Occupational History   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  Tobacco Use   Smoking status: Never   Smokeless tobacco: Never  Vaping Use   Vaping status: Never Used  Substance and Sexual Activity   Alcohol use: No    Alcohol/week: 0.0 standard drinks of alcohol   Drug use: No   Sexual activity: Yes  Other Topics Concern   Not on file  Social History Narrative   Caffeine: 1 cup/day, 2 excedrin/day   Married since 1959   2 daughters   Occupation: retired, was Government social research officer)   Activity: yardwork, stays active with grandson (swimming)   Diet: avoids carbs, good water, fruits/vegetables daily   Social Determinants of Health   Financial Resource Strain: Low Risk  (10/21/2023)   Overall Financial Resource Strain (CARDIA)    Difficulty of Paying Living Expenses: Not hard at all  Food Insecurity: No Food Insecurity (10/21/2023)   Hunger Vital Sign    Worried About Running Out of Food in the Last Year: Never true    Ran Out of Food in the Last Year: Never true  Transportation Needs: No Transportation Needs (10/21/2023)   PRAPARE - Administrator, Civil Service (Medical): No    Lack of Transportation (Non-Medical): No  Physical Activity:  Insufficiently Active (10/21/2023)   Exercise Vital Sign    Days of Exercise per Week: 3 days    Minutes of Exercise per Session: 30 min  Stress: No Stress Concern Present (10/21/2023)   [employer] of Occupational Health - Occupational Stress Questionnaire    Feeling of Stress : Not at all  Social Connections: Moderately Integrated (10/21/2023)   Social Connection and Isolation Panel [NHANES]    Frequency of Communication with Friends and Family: More than three times a week    Frequency of Social Gatherings with Friends and Family: More than three times a week    Attends Religious Services: More than 4 times per year    Active Member of Clubs or Organizations: No  Attends Banker Meetings: Never    Marital Status: Married    Tobacco Counseling Counseling given: Not Answered   Clinical Intake:  Pre-visit preparation completed: Yes  Pain : No/denies pain     Nutritional Risks: None Diabetes: No  How often do you need to have someone help you when you read instructions, pamphlets, or other written materials from your doctor or pharmacy?: 1 - Never  Interpreter Needed?: No  Information entered by :: Renie Ora, LPN   Activities of Daily Living    10/21/2023   11:05 AM 10/28/2022    8:14 AM  In your present state of health, do you have any difficulty performing the following activities:  Hearing? 0 1  Vision? 0 0  Difficulty concentrating or making decisions? 0 0  Walking or climbing stairs? 0 0  Dressing or bathing? 0 0  Doing errands, shopping? 0   Preparing Food and eating ? N   Using the Toilet? N   In the past six months, have you accidently leaked urine? N   Do you have problems with loss of bowel control? N   Managing your Medications? N   Managing your Finances? N   Housekeeping or managing your Housekeeping? N     Patient Care Team: Eustaquio Boyden, MD as PCP - General (Family Medicine) Smith Robert, MD as Consulting  Physician (Urology)  Indicate any recent Medical Services you may have received from other than [facility] providers in the past year (date may be approximate).     Assessment:   This is a routine wellness examination for [place].  Hearing/Vision screen Vision Screening - Comments:: Wears rx glasses - up to date with routine eye exams with   Eye    Goals Addressed             This Visit's Progress    DIET - INCREASE WATER INTAKE         Depression Screen    10/21/2023   11:03 AM 09/14/2023    8:22 AM 03/03/2023    9:34 AM 11/02/2022   10:27 AM 11/17/2021   10:12 AM 10/22/2020    9:06 AM 07/10/2019    9:26 AM  PHQ 2/9 Scores  PHQ - 2 Score 0 3 4 0 1 1 0  PHQ- 9 Score 0 3 7  8       Fall Risk    10/21/2023   11:02 AM 09/14/2023    8:22 AM 03/03/2023    9:34 AM 11/02/2022   10:26 AM 11/17/2021    9:20 AM  Fall Risk   Falls in the past year? 0 0 0 0 1  Number falls in past yr: 0    1  Injury with Fall? 0    0  Risk for fall due to : No Fall Risks      Follow up Falls prevention discussed        MEDICARE RISK AT HOME: Medicare Risk at Home Any stairs in or around the home?: Yes If so, are there any without handrails?: No Home free of loose throw rugs in walkways, pet beds, electrical cords, etc?: Yes Adequate lighting in your home to reduce risk of falls?: Yes Life alert?: No Use of a cane, walker or w/c?: No Grab bars in the bathroom?: Yes Shower chair or bench in shower?: Yes Elevated toilet seat or a handicapped toilet?: Yes  TIMED UP AND GO:  Was the test performed?  No    Cognitive  Function:        10/21/2023   11:05 AM  6CIT Screen  What Year? 0 points  What month? 0 points  What time? 0 points  Count back from 20 0 points  Months in reverse 0 points  Repeat phrase 0 points  Total Score 0 points    Immunizations Immunization History  Administered Date(s) Administered   PFIZER(Purple Top)SARS-COV-2 Vaccination 01/04/2020, 01/25/2020,  09/17/2020   Pneumococcal Conjugate-13 06/18/2014   Pneumococcal Polysaccharide-23 03/19/2010   Td 12/21/1996, 03/19/2010    TDAP status: Due, Education has been provided regarding the importance of this vaccine. Advised may receive this vaccine at local pharmacy or Health Dept. Aware to provide a copy of the vaccination record if obtained from local pharmacy or Health Dept. Verbalized acceptance and understanding.  Flu Vaccine status: Due, Education has been provided regarding the importance of this vaccine. Advised may receive this vaccine at local pharmacy or Health Dept. Aware to provide a copy of the vaccination record if obtained from local pharmacy or Health Dept. Verbalized acceptance and understanding.  Pneumococcal vaccine status: Up to date  Covid-19 vaccine status: Completed vaccines  Qualifies for Shingles Vaccine? Yes   Zostavax completed No   Shingrix Completed?: No.    Education has been provided regarding the importance of this vaccine. Patient has been advised to call insurance company to determine out of pocket expense if they have not yet received this vaccine. Advised may also receive vaccine at local pharmacy or Health Dept. Verbalized acceptance and understanding.  Screening Tests Health Maintenance  Topic Date Due   Zoster Vaccines- Shingrix (1 of 2) Never done   Diabetic kidney evaluation - Urine ACR  05/14/2012   DTaP/Tdap/Td (3 - Tdap) 03/19/2020   COVID-19 Vaccine (4 - 2023-24 season) 08/22/2023   Diabetic kidney evaluation - eGFR measurement  10/27/2023   INFLUENZA VACCINE  03/20/2024 (Originally 07/22/2023)   Medicare Annual Wellness (AWV)  10/20/2024   Pneumonia Vaccine 29+ Years old  Completed   HPV VACCINES  Aged Out    Health Maintenance  Health Maintenance Due  Topic Date Due   Zoster Vaccines- Shingrix (1 of 2) Never done   Diabetic kidney evaluation - Urine ACR  05/14/2012   DTaP/Tdap/Td (3 - Tdap) 03/19/2020   COVID-19 Vaccine (4 - 2023-24  season) 08/22/2023   Diabetic kidney evaluation - eGFR measurement  10/27/2023    Colorectal cancer screening: No longer required.   Lung Cancer Screening: (Low Dose CT Chest recommended if Age 5-80 years, 20 pack-year currently smoking OR have quit w/in 15years.) does not qualify.   Lung Cancer Screening Referral: n/a  Additional Screening:  Hepatitis C Screening: does not qualify;   Vision Screening: Recommended annual ophthalmology exams for early detection of glaucoma and other disorders of the eye. Is the patient up to date with their annual eye exam?  Yes  Who is the provider or what is the name of the office in which the patient attends annual eye exams? Dr.Brasington  If pt is not established with a provider, would they like to be referred to a provider to establish care? No .   Dental Screening: Recommended annual dental exams for proper oral hygiene   Community Resource Referral / Chronic Care Management: CRR required this visit?  No   CCM required this visit?  No     Plan:     I have personally reviewed and noted the following in the patient's chart:   Medical and social history  Use of alcohol, tobacco or illicit drugs  Current medications and supplements including opioid prescriptions. Patient is not currently taking opioid prescriptions. Functional ability and status Nutritional status Physical activity Advanced directives List of other physicians Hospitalizations, surgeries, and ER visits in previous 12 months Vitals Screenings to include cognitive, depression, and falls Referrals and appointments  In addition, I have reviewed and discussed with patient certain preventive protocols, quality metrics, and best practice recommendations. A written personalized care plan for preventive services as well as general preventive health recommendations were provided to patient.     Lorrene Reid, LPN   [id number]   After Visit Summary: (MyChart) Due to this  being a telephonic visit, the after visit summary with patients personalized plan was offered to patient via MyChart   Nurse Notes: none

## 2023-10-21 NOTE — Patient Instructions (Signed)
Mark Holmes , Thank you for taking time to come for your Medicare Wellness Visit. I appreciate your ongoing commitment to your health goals. Please review the following plan we discussed and let me know if I can assist you in the future.   Referrals/Orders/Follow-Ups/Clinician Recommendations: Aim for 30 minutes of exercise or brisk walking, 6-8 glasses of water, and 5 servings of fruits and vegetables each day.   This is a list of the screening recommended for you and due dates:  Health Maintenance  Topic Date Due   Zoster (Shingles) Vaccine (1 of 2) Never done   Yearly kidney health urinalysis for diabetes  05/14/2012   DTaP/Tdap/Td vaccine (3 - Tdap) 03/19/2020   COVID-19 Vaccine (4 - 2023-24 season) 08/22/2023   Yearly kidney function blood test for diabetes  10/27/2023   Flu Shot  03/20/2024*   Medicare Annual Wellness Visit  10/20/2024   Pneumonia Vaccine  Completed   HPV Vaccine  Aged Out  *Topic was postponed. The date shown is not the original due date.    Advanced directives: (Copy Requested) Please bring a copy of your health care power of attorney and living will to the office to be added to your chart at your convenience.  Next Medicare Annual Wellness Visit scheduled for next year: Yes  insert Preventive Care attachment Insert FALL PREVENTION attachment if needed

## 2023-10-25 ENCOUNTER — Encounter: Payer: Medicare Other | Admitting: Physical Therapy

## 2023-10-27 ENCOUNTER — Encounter: Payer: Medicare Other | Admitting: Physical Therapy

## 2023-10-27 ENCOUNTER — Encounter: Payer: Self-pay | Admitting: Physical Therapy

## 2023-10-27 ENCOUNTER — Ambulatory Visit: Payer: Medicare Other | Attending: Student | Admitting: Physical Therapy

## 2023-10-27 DIAGNOSIS — M792 Neuralgia and neuritis, unspecified: Secondary | ICD-10-CM | POA: Diagnosis present

## 2023-10-27 DIAGNOSIS — G8929 Other chronic pain: Secondary | ICD-10-CM | POA: Diagnosis present

## 2023-10-27 DIAGNOSIS — M25511 Pain in right shoulder: Secondary | ICD-10-CM | POA: Diagnosis present

## 2023-10-27 DIAGNOSIS — R2689 Other abnormalities of gait and mobility: Secondary | ICD-10-CM | POA: Diagnosis present

## 2023-10-27 DIAGNOSIS — M6281 Muscle weakness (generalized): Secondary | ICD-10-CM | POA: Diagnosis present

## 2023-10-27 NOTE — Therapy (Signed)
OUTPATIENT PHYSICAL THERAPY TREATMENT / DISCHARGE SUMMARY Dates of reporting from 09/15/2023 to 10/27/2023   Patient Name: Mark Holmes MRN: [id number] DOB:[birth year], 88 y.o., male Today's Date: 10/27/2023  END OF SESSION:  PT End of Session - 10/27/23 1818     Visit Number 7    Number of Visits 13    Date for PT Re-Evaluation 12/08/23    Authorization Type [employer] MEDICARE reporting period from 09/15/2023    Authorization Time Period Auth 16 visits 9/25-11/20  auth# [id number]    Authorization - Visit Number 7    Authorization - Number of Visits 16    Progress Note Due on Visit 10    PT Start Time 1120    PT Stop Time 1210    PT Time Calculation (min) 50 min    Activity Tolerance Patient tolerated treatment well             Past Medical History:  Diagnosis Date   Benign prostatic hypertrophy with elevated PSA    per Dr. Achilles Dunk   History of CT scan of brain 1992   normal   Hyperglycemia 01/2006   102   Hyperlipemia    Hypertension    Seizure disorder [facility])    Past Surgical History:  Procedure Laterality Date   ANTERIOR CERVICAL DECOMP/DISCECTOMY FUSION  05/2018   for myelopathy -C3/4 ([facility])   CARPAL TUNNEL RELEASE Right 06/2019   ([facility])   CATARACT EXTRACTION W/ INTRAOCULAR LENS IMPLANT Right 2012   [facility]   CATARACT EXTRACTION W/PHACO Left 01/14/2022   Procedure: CATARACT EXTRACTION PHACO AND INTRAOCULAR LENS PLACEMENT (IOC) LEFT;  Surgeon: Lockie Mola, MD;  Location: [facility];  Service: Ophthalmology;  Laterality: Left;  7.55 01:03.6   History of EEG  1985   normal   hospitalization  1981   observation for seizuare   TONSILLECTOMY     TRANSURETHRAL RESECTION OF PROSTATE  10/2005   [facility]   TRANSURETHRAL RESECTION OF PROSTATE  08/2017   (rpt) [facility]   Patient Active Problem List   Diagnosis Date Noted   Bilateral leg pain 06/08/2023   Right shoulder pain 06/08/2023   Spinal stenosis of lumbar region with  neurogenic claudication 03/04/2023   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   Bilateral hearing loss 06/30/2022   Brain aneurysm 06/30/2022   History of ischemic stroke 06/20/2022   Nephrotic range proteinuria 06/04/2022   Hypoproteinemia (HCC) 06/01/2022   Atypical chest pain 07/10/2020   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   Unsteadiness on feet 11/08/2019   History of spinal surgery 01/17/2019   Neck pain 01/17/2019   Weakness of hand 01/17/2019   BPH (benign prostatic hyperplasia) 01/17/2019   Systolic murmur 07/04/2018   Right carpal tunnel syndrome 02/02/2018   Ulnar neuropathy at elbow, left 02/02/2018   Peripheral neuropathy 01/26/2018   Bilateral buttock pain 06/29/2016   Failed vision screen 06/29/2016   Advanced care planning/counseling discussion 06/21/2015   Medicare annual wellness visit, subsequent 06/18/2014   Health maintenance examination 06/18/2014   Chronic ankle pain 06/18/2014   Incomplete emptying of bladder 06/07/2013   Vitamin D deficiency 05/20/2013   THYROID NODULE 05/17/2007   Mixed hyperlipidemia 05/17/2007   Essential tremor 05/17/2007   Essential hypertension 05/17/2007   Cervical spondylosis with myelopathy and radiculopathy 05/17/2007   Seizure disorder (HCC) 05/17/2007    PCP: Eustaquio Boyden, MD  REFERRING PROVIDER:  Janice Coffin, PA-C (Neurology)  REFERRING DIAG: neuropathy, right shoulder pain  THERAPY DIAG:  Chronic right shoulder pain  Muscle weakness (generalized)  Other abnormalities of gait and mobility  Neuralgia and neuritis  Rationale for Evaluation and Treatment: Rehabilitation  ONSET DATE: right shoulder pain end of April 2024, neuropathy symptoms chronic  PERTINENT HISTORY: Patient is a 88 y.o. male who presents to outpatient physical therapy with a referral for medical diagnosis neuropathy, right shoulder pain. This patient's chief complaints consist of right shoulder pain and weakness and ongoing difficulty with  mobility due to imbalance, tightness/pain in his B lower legs, and generalized weakness leading to the following functional deficits: difficulty with anything that requires use of right UE, especially raising arm laterally, dressing, walking the dog, keeping his balance, sleeping, getting up and moving around, household and community mobility, going out in the community, housework, gardening, going to the store. 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  Patient denies hx of cancer, lung problems, heart problems, diabetes, unexplained weight loss, unexplained changes in bowel or bladder problems, and osteoporosis.   SUBJECTIVE:                                                                                                                                                                                      SUBJECTIVE STATEMENT: Patient arrives with [facility] and air cast at right ankle. He states his right shoulder is  feeling well with no pain currently. He denies falls since last PT session. He got a new cane that he adjusted to the same height as last time. He states his left knee is "working out great" and he can walk down the stairs normally. His HEP is going well. He states his shoulder is not bothering him like it used to and it is easier for him to reach the light switch without needing to move his other arm to help him.   PAIN:  NPRS: 0/10   PATIENT  GOALS: "to be able to use my right shoulder to better facilitate my exercising"   NEXT MD VISIT:   OBJECTIVE  SELF-REPORTED FUNCTION FOTO score: 67/100 (shoulder questionnaire)   [facility] PT Assessment - 10/27/23 0001       Functional Gait  Assessment   Gait assessed  --   no AD used   Gait Level Surface Walks 20 ft in less than 5.5 sec, no assistive devices, good speed, no evidence for imbalance, normal gait pattern, deviates no more than 6 in outside of the 12 in walkway width.   Self-selected pace: 5.87 seconds; fast pace: 3.5 seconds.   Change in Gait Speed Able to smoothly change walking speed without loss of balance or gait deviation. Deviate no more than 6 in outside of the 12 in walkway width.    Gait with Horizontal Head Turns Performs head turns smoothly with no change in gait. Deviates no more than 6 in outside 12 in walkway width    Gait with Vertical Head Turns Performs head turns with no change in gait. Deviates no more than 6 in outside 12 in walkway width.    Gait and Pivot Turn Pivot turns safely within 3 sec and stops quickly with no loss of balance.    Step Over Obstacle Is able to step over 2 stacked shoe boxes taped together (9 in total height) without changing gait speed. No evidence of imbalance.    Gait with Narrow Base of Support Ambulates less than 4 steps heel to toe or cannot perform without assistance.    Gait with Eyes Closed Cannot walk 20 ft without assistance, severe gait deviations or imbalance, deviates greater than 15  in outside 12 in walkway width or will not attempt task.    Ambulating Backwards Walks 20 ft, no assistive devices, good speed, no evidence for imbalance, normal gait   10.53 seconds   Steps Alternating feet, must use rail.    Total Score 23    FGA comment: 19-24 = medium risk fall              TODAY'S TREATMENT:   Therapeutic exercise: to centralize symptoms and improve ROM, strength, muscular endurance, and activity tolerance required for successful completion of functional activities.  - NuStep level 7 using bilateral upper and lower extremities. Seat/handle setting 10/11. For improved extremity mobility, muscular endurance, and activity tolerance; and to induce the analgesic effect of aerobic exercise, stimulate improved joint nutrition, and prepare body structures and systems for following interventions. x 5  minutes. - quadruped (airex under knees) renegade rows with focus on improving shoulder stability and serratus anterior strength/endurance. 2x10 each side with 7#DB  - education on discharge recommendations.   Neuromuscular Re-education: to improve, balance, postural strength, muscle activation patterns, and stabilization strength required for functional activities: - functional gait assessment to assess progress (see above).   Pt required multimodal cuing for proper technique and to facilitate improved neuromuscular control, strength, range of motion, and functional ability resulting in improved performance and form.  PATIENT EDUCATION: Education details: Exercise purpose/form. Self management techniques. Education on diagnosis, prognosis, POC, discharge recommendations. Education on HEP including handout. Person educated: Patient Education method: Explanation and Verbal cues Education comprehension: verbalized understanding and returned demonstration  HOME EXERCISE PROGRAM: Access Code: [email or web address] URL: [email or web address] Date: 09/28/2023 Prepared by: Norton Blizzard  Exercises - Shoulder External Rotation with Anchored Resistance  - 1 x daily - 3 sets - 15 reps - Forearm Walks on Wall with Resistance Band  -  3-5 x weekly - 2-3 sets - 8-10 reps  HOME EXERCISE PROGRAM [[email or web address]] View at [email or web address]" using code: [email or web address] Quadrupled Renegade Row -  Repeat 10 Repetitions, Complete 3 Sets, Perform 3 Times a Week  ASSESSMENT:  CLINICAL IMPRESSION: Patient has attended 7 physical therapy sessions since starting current episode of care. He has met all of his shoulder-related goals and has improved Functional Gait Assessment (FGA) by 2 points today. Most of the activities on FGA that he loses points on are unlikely to improve due to the nature of his condition. He continues to do an excellent job with HEP participation and appears ready for discharge. He is unlikely to get much more strength in his left shoulder for abduction positions due to likely large rotator cuff tear in the supraspinatus demonstrated by positive arm drop test and observation. The exercises he is currently participating in for HEP are appropriate for his chronic back problems.  He is now discharged to independent management with long term HEP and recommended to follow up with his doctors to be possibly referred back to PT or for other intervention options if he experiences any further decline in function.   OBJECTIVE IMPAIRMENTS: Abnormal gait, decreased activity tolerance, decreased balance, decreased coordination, decreased endurance, decreased knowledge of condition, decreased mobility, difficulty walking, decreased ROM, decreased strength, impaired perceived functional ability, increased muscle spasms, impaired sensation, impaired UE functional use, improper body mechanics, postural dysfunction, and pain.   ACTIVITY LIMITATIONS: carrying, lifting, bending, sitting, standing, squatting, sleeping, stairs, transfers, bathing, dressing, reach over head, hygiene/grooming, locomotion  level, and caring for others  PARTICIPATION LIMITATIONS: laundry, interpersonal relationship, shopping, community activity, yard work, and   difficulty with anything that requires use of right UE, especially raising arm laterally, dressing, walking the dog, keeping his balance, sleeping, getting up and moving around, household and community mobility, going out in the community, housework, gardening, going to the store  PERSONAL FACTORS: Age, Behavior pattern, Fitness, Past/current experiences, Time since onset of injury/illness/exacerbation, and 3+ comorbidities:   CVA on 06/20/2022 (MRI showed punctate acute/subacute nonhemorrhagic infarct in the posterior left lower midbrain near the 4th cranial nerve nucleus with remote lacunar infarcts of the right thalamus and right cerebellum),  4mm PCOM aneurysm (found incidentally on CTA 06/20/2022, currently undergoing conservative monitoring), concern over dilantin related neuropathy (starting to wean off), systolic murmur, benign prostatic hyperplasia with urinary obstruction (followed by urology), chronic R ankle pain (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   REHAB POTENTIAL: Fair due to testing suggesting full thickness R supraspinatus tear, and balance/mobility symptoms responding incompletely to PT in the past  CLINICAL DECISION MAKING: Evolving/moderate complexity  EVALUATION COMPLEXITY: Moderate   GOALS: Goals reviewed with patient? Yes  SHORT TERM GOALS:  Target date: 09/29/2023  Patient will be independent with initial home exercise program for self-management of symptoms. Baseline: Initial HEP to be provided at visit 2 as appropriate (09/15/23); Goal status: MET   LONG TERM GOALS: Target date: 12/08/2023  Patient will be independent with a long-term home exercise program for self-management of symptoms.  Baseline: Initial HEP to be  provided at visit 2 as appropriate (09/15/23); patient participating well (09/28/2023);  Goal status: MET  2.  Patient will demonstrate improved shoulder FOTO to equal or greater than 63 by visit #11 to demonstrate improvement in overall condition and self-reported functional ability.  Baseline: 54 (09/15/23); 67 at visit #7 (10/27/2023);  Goal status: MET  3.  Patient will report the ability to sleep on his right shoulder without being awoken by right shoulder pain.  Baseline: cannot sleep on the right side (09/15/23); can now sleep in right side without being awoken by shoulder pain (10/27/2023);  Goal status: MET  4.  Patient will score equal or greater than 25/30 on Functional Gait Assessment to demonstrate low fall risk.  Baseline: to be tested at visit 2 as appropriate (09/15/23); 21/30 (09/20/2023);  23/30 (10/27/2023); Goal status: Partially met  5.  Patient will complete community, work and/or recreational activities with 50% less limitation due to current condition.  Baseline: difficulty with anything that requires use of right UE, especially raising arm laterally, dressing, walking the dog, keeping his balance, sleeping, getting up and moving around, household and community mobility, going out in the community, housework, gardening, going to the store (09/15/23); 70% improvement (10/27/2023);  Goal status: MET   PLAN:  PT FREQUENCY: 1-2x/week  PT DURATION: 12 weeks  PLANNED INTERVENTIONS: Therapeutic exercises, Therapeutic activity, Neuromuscular re-education, Balance training, Gait training,  Patient/Family education, Self Care, Joint mobilization, Dry Needling, Electrical stimulation, Spinal mobilization, Cryotherapy, Moist heat, Manual therapy, and Re-evaluation  PLAN FOR NEXT SESSION: Patient is now discharged to long term HEP for independent management.    Cira Rue, PT, DPT 10/27/2023, 6:34 PM   [facility] [address] [place], [place] [address] P: [phone] I F: [phone]

## 2023-10-29 ENCOUNTER — Ambulatory Visit: Payer: Medicare Other | Attending: Cardiovascular Disease | Admitting: Cardiovascular Disease

## 2023-10-29 ENCOUNTER — Encounter: Payer: Self-pay | Admitting: Cardiovascular Disease

## 2023-10-29 VITALS — BP 130/70 | HR 73 | Ht 71.0 in | Wt 166.2 lb

## 2023-10-29 DIAGNOSIS — G40909 Epilepsy, unspecified, not intractable, without status epilepticus: Secondary | ICD-10-CM | POA: Diagnosis not present

## 2023-10-29 DIAGNOSIS — I700 Atherosclerosis of aorta: Secondary | ICD-10-CM

## 2023-10-29 DIAGNOSIS — I639 Cerebral infarction, unspecified: Secondary | ICD-10-CM

## 2023-10-29 DIAGNOSIS — I2510 Atherosclerotic heart disease of native coronary artery without angina pectoris: Secondary | ICD-10-CM

## 2023-10-29 DIAGNOSIS — E782 Mixed hyperlipidemia: Secondary | ICD-10-CM

## 2023-10-29 DIAGNOSIS — R600 Localized edema: Secondary | ICD-10-CM

## 2023-10-29 DIAGNOSIS — I671 Cerebral aneurysm, nonruptured: Secondary | ICD-10-CM

## 2023-10-29 MED ORDER — EZETIMIBE 10 MG PO TABS: 10.0000 mg | ORAL_TABLET | Freq: Every day | ORAL | 3 refills | Status: DC

## 2023-10-29 NOTE — Progress Notes (Signed)
Cardiology Office Note  Date:  10/29/2023   ID:  Mark Holmes, DOB [birth year], MRN [id number]  PCP:  Eustaquio Boyden, MD   Chief Complaint  Patient presents with   6 month follow up     "Doing well." Medications reviewed by the patient verbally.     HPI:  Mark Holmes is a 88 y.o. 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, cervical radiculopathy status post ACDF C3-C4, hypertension hyperlipidemia,  History of stroke who is being seen in clinic for follow-up of his cardiac risk factors   In follow-up today main complaint is his legs Reports he has stopped Lipitor 40, keeps the pill on the counter, feels it is caused muscle aching stiffness in his legs Symptoms seem to have improved off the Lipitor Walks with a cane, no regular walking program  Seen by neurosurgery, not a good candidate for surgery given multilevel disease Recommendation made to consider spinal cord stimulator, he has not researched this  Denies significant chest pain or shortness of breath concerning for angina  EKG personally reviewed by myself on todays visit EKG Interpretation Date/Time:  Friday October 29 2023 10:32:39 EST Ventricular Rate:  73 PR Interval:  142 QRS Duration:  78 QT Interval:  382 QTC Calculation: 420 R Axis:   -8  Text Interpretation: Sinus rhythm with marked sinus arrhythmia Minimal voltage criteria for LVH, may be normal variant ( R in aVL ) When compared with ECG of 20-Jun-2022 13:56, No significant change was found Confirmed by Julien Nordmann [phone]) on 10/29/2023 10:39:40 AM    in the hospital July 2021 for chest pain, felt to be musculoskeletal  hospital July 2023 double vision Hospitalized, brain MRI showed subacute nonhemorrhagic infarct in the posterior left lower midbrain and 4th cranial nerve nucleus, (positive) right cerebellum, atrophy and white matter disease likely reflecting sequela of chronic  microvascular ischemia.   He was evaluated by neurosurgery for his brainstem stroke.   Vascular imaging demonstrated a left-sided aneurysm.  It was decided at that time it was no intervention needed from neurosurgery   placed on a ZIO monitor with concern of his brainstem stroke   no atrial fibrillation     protein in his urine so had his ACEi dose increased.    PMH:   has a past medical history of Benign prostatic hypertrophy with elevated PSA, History of CT scan of brain (1992), Hyperglycemia (01/2006), Hyperlipemia, Hypertension, and Seizure disorder (HCC).  PSH:    Past Surgical History:  Procedure Laterality Date   ANTERIOR CERVICAL DECOMP/DISCECTOMY FUSION  05/2018   for myelopathy -C3/4 ([facility])   CARPAL TUNNEL RELEASE Right 06/2019   ([facility])   CATARACT EXTRACTION W/ INTRAOCULAR LENS IMPLANT Right 2012   [facility]   CATARACT EXTRACTION W/PHACO Left 01/14/2022   Procedure: CATARACT EXTRACTION PHACO AND INTRAOCULAR LENS PLACEMENT (IOC) LEFT;  Surgeon: Lockie Mola, MD;  Location: [facility];  Service: Ophthalmology;  Laterality: Left;  7.55 01:03.6   History of EEG  1985   normal   hospitalization  1981   observation for seizuare   TONSILLECTOMY     TRANSURETHRAL RESECTION OF PROSTATE  10/2005   [name]   TRANSURETHRAL RESECTION OF PROSTATE  08/2017   (rpt) [facility] D[facility]    Current Outpatient Medications  Medication Sig Dispense Refill   Alpha-Lipoic Acid 600 MG TABS Take 1 tablet by mouth daily.     aspirin EC 81 MG  tablet Take 1 tablet (81 mg total) by mouth daily. Swallow whole. 30 tablet 12   aspirin-acetaminophen-caffeine (EXCEDRIN MIGRAINE) 250-250-65 MG per tablet Take 1 tablet by mouth 2 (two) times daily as needed.      Cholecalciferol (VITAMIN D3) 1.25 MG (50000 UT) TABS Take 1 tablet by mouth once a week. 12 tablet 1   ezetimibe (ZETIA) 10 MG tablet Take 1 tablet (10 mg total) by mouth daily. 90 tablet 3   finasteride (PROSCAR) 5 MG  tablet Take 1 tablet (5 mg total) by mouth daily. 90 tablet 3   folic acid (FOLVITE) 400 MCG tablet Take 1 tablet (0.4 mg total) by mouth daily.     Iron, Ferrous Sulfate, 325 (65 Fe) MG TABS Take 325 mg by mouth every Monday, Wednesday, and Friday.     Omega 3 1000 MG CAPS Take 1 capsule by mouth daily.     pregabalin (LYRICA) 50 MG capsule Take 1 capsule (50 mg total) by mouth at bedtime. 30 capsule 5   ramipril (ALTACE) 10 MG capsule Take 1 capsule (10 mg total) by mouth 2 (two) times daily. 180 capsule 1   terazosin (HYTRIN) 10 MG capsule Take 1 capsule (10 mg total) by mouth daily. 90 capsule 1   No current facility-administered medications for this visit.    Allergies:   Dilaudid [hydromorphone hcl] and Gabapentin   Social History:  The patient  reports that he has never smoked. He has never used smokeless tobacco. He reports that he does not drink alcohol and does not use drugs.   Family History:   family history includes Cancer in his sister; Febrile seizures in his sister.    Review of Systems: Review of Systems  Constitutional: Negative.   HENT: Negative.    Respiratory: Negative.    Cardiovascular: Negative.   Gastrointestinal: Negative.   Musculoskeletal: Negative.        Leg pain  Neurological: Negative.   Psychiatric/Behavioral: Negative.    All other systems reviewed and are negative.    PHYSICAL EXAM: VS:  BP 130/70 (BP Location: Left Arm, Patient Position: Sitting, Cuff Size: Normal)   Pulse 73   Ht 5\' 11"  (1.803 m)   Wt 166 lb 4 oz (75.4 kg)   SpO2 98%   BMI 23.19 kg/m  , BMI Body mass index is 23.19 kg/m. GEN: Well nourished, well developed, in no acute distress HEENT: normal Neck: no JVD, carotid bruits, or masses Cardiac: RRR; no murmurs, rubs, or gallops,no edema  Respiratory:  clear to auscultation bilaterally, normal work of breathing GI: soft, nontender, nondistended, + BS MS: no deformity or atrophy Skin: warm and dry, no rash Neuro:   Strength and sensation are intact Psych: euthymic mood, full affect   Recent Labs: 06/07/2023: Magnesium 2.1    Lipid Panel Lab Results  Component Value Date   CHOL 126 10/26/2022   HDL 48.90 10/26/2022   LDLCALC 59 10/26/2022   TRIG 88.0 10/26/2022      Wt Readings from Last 3 Encounters:  10/29/23 166 lb 4 oz (75.4 kg)  10/21/23 165 lb (74.8 kg)  10/14/23 164 lb (74.4 kg)     ASSESSMENT AND PLAN:  Problem List Items Addressed This Visit       Cardiology Problems   Brain aneurysm   Relevant Medications   ezetimibe (ZETIA) 10 MG tablet   Mixed hyperlipidemia   Relevant Medications   ezetimibe (ZETIA) 10 MG tablet     Other   Seizure disorder (  HCC)   Other Visit Diagnoses     Coronary artery calcification    -  Primary   Relevant Medications   ezetimibe (ZETIA) 10 MG tablet   Other Relevant Orders   EKG 12-Lead (Completed)   Aortic atherosclerosis (HCC)       Relevant Medications   ezetimibe (ZETIA) 10 MG tablet   Other Relevant Orders   EKG 12-Lead (Completed)   Cerebrovascular accident (CVA), unspecified mechanism (HCC)       Relevant Medications   ezetimibe (ZETIA) 10 MG tablet   Other Relevant Orders   EKG 12-Lead (Completed)   Peripheral edema       Relevant Orders   EKG 12-Lead (Completed)       Coronary artery calcification and aortic atherosclerosis  noted on CT.  Currently with no symptoms of angina. No further workup at this time. Continue current medication regimen. No ischemic changes noted on EKG. Continued on ASA  He reports he stopped his Lipitor, recommend he start Zetia 10 mg daily given his myalgias.   Essential hypertension   ramipril 10 mg twice daily.  Blood pressure is well controlled on today's visit. No changes made to the medications.   Mixed hyperlipidemia  LDL 59 11/23.  on atorvastatin 40 mg daily.  Reports he stopped Lipitor, myalgias improved Recommend he consider taking Zetia 10 mg daily as  alternative  Previous CVA with residual defects.   Previously was on aspirin  Clopidogrel was discontinued  Reports he stopped his Lipitor 40 mg daily.   Zio monitor with no atrial fibrillation  incidental brain aneurysm  on screening.  Repeat screening shows a reduction in size status stable.  Surveillance monitoring to be managed by neurology/neurosurgery.   Seizure disorder.   Weaned off Dilantin     Signed, Dossie Arbour, M.D., Ph.D. [facility] [place], [place] [phone]

## 2023-10-29 NOTE — Patient Instructions (Addendum)
Read about spinal cord stimulation    Medication Instructions:  Ok to stop the atorvastatin Call if you would like to start zetia  If you need a refill on your cardiac medications before your next appointment, please call your pharmacy.   Lab work: No new labs needed  Testing/Procedures: No new testing needed  Follow-Up: At [facility], you and your health needs are our priority.  As part of our continuing mission to provide you with exceptional heart care, we have created designated Provider Care Teams.  These Care Teams include your primary Cardiologist (physician) and Advanced Practice Providers (APPs -  Physician Assistants and Nurse Practitioners) who all work together to provide you with the care you need, when you need it.  You will need a follow up appointment in 12 months  Providers on your designated Care Team:   Nicolasa Ducking, NP Eula Listen, PA-C Cadence Fransico Michael, [place]  COVID-19 Vaccine Information can be found at: [email or web address] For questions related to vaccine distribution or appointments, please email [email or web address] or call [phone].

## 2023-11-02 ENCOUNTER — Encounter: Payer: Medicare Other | Admitting: Physical Therapy

## 2023-11-03 ENCOUNTER — Encounter: Payer: Medicare Other | Admitting: Physical Therapy

## 2023-11-04 ENCOUNTER — Encounter: Payer: Medicare Other | Admitting: Physical Therapy

## 2023-11-09 ENCOUNTER — Encounter: Payer: Medicare Other | Admitting: Physical Therapy

## 2023-11-10 ENCOUNTER — Encounter: Payer: Medicare Other | Admitting: Physical Therapy

## 2023-11-11 ENCOUNTER — Encounter: Payer: Medicare Other | Admitting: Physical Therapy

## 2023-11-15 ENCOUNTER — Encounter: Payer: Medicare Other | Admitting: Physical Therapy

## 2023-11-17 ENCOUNTER — Encounter: Payer: Medicare Other | Admitting: Physical Therapy

## 2023-11-24 ENCOUNTER — Encounter: Payer: Medicare Other | Admitting: Physical Therapy

## 2023-11-29 ENCOUNTER — Other Ambulatory Visit: Payer: Self-pay | Admitting: Neurosurgery

## 2023-11-29 DIAGNOSIS — I671 Cerebral aneurysm, nonruptured: Secondary | ICD-10-CM

## 2023-12-08 ENCOUNTER — Ambulatory Visit
Admission: RE | Admit: 2023-12-08 | Discharge: 2023-12-08 | Disposition: A | Discharge status: Home / Self Care | Payer: Medicare Other | Source: Ambulatory Visit | Attending: Neurosurgery | Admitting: Neurosurgery

## 2023-12-08 DIAGNOSIS — I671 Cerebral aneurysm, nonruptured: Secondary | ICD-10-CM | POA: Insufficient documentation

## 2023-12-08 MED ORDER — IOHEXOL 350 MG/ML SOLN
75.0000 mL | Freq: Once | INTRAVENOUS | Status: AC | PRN
  Administered 2023-12-08: 75 mL via INTRAVENOUS

## 2023-12-16 ENCOUNTER — Other Ambulatory Visit: Payer: Self-pay | Admitting: Family Medicine

## 2023-12-16 DIAGNOSIS — G603 Idiopathic progressive neuropathy: Secondary | ICD-10-CM

## 2023-12-16 DIAGNOSIS — N052 Unspecified nephritic syndrome with diffuse membranous glomerulonephritis: Secondary | ICD-10-CM

## 2023-12-16 DIAGNOSIS — E782 Mixed hyperlipidemia: Secondary | ICD-10-CM

## 2023-12-16 DIAGNOSIS — E559 Vitamin D deficiency, unspecified: Secondary | ICD-10-CM

## 2023-12-16 DIAGNOSIS — E678 Other specified hyperalimentation: Secondary | ICD-10-CM

## 2023-12-20 ENCOUNTER — Other Ambulatory Visit: Payer: Medicare Other

## 2023-12-21 ENCOUNTER — Other Ambulatory Visit (INDEPENDENT_AMBULATORY_CARE_PROVIDER_SITE_OTHER): Payer: Medicare Other

## 2023-12-21 DIAGNOSIS — G603 Idiopathic progressive neuropathy: Secondary | ICD-10-CM | POA: Diagnosis not present

## 2023-12-21 DIAGNOSIS — E559 Vitamin D deficiency, unspecified: Secondary | ICD-10-CM | POA: Diagnosis not present

## 2023-12-21 DIAGNOSIS — E782 Mixed hyperlipidemia: Secondary | ICD-10-CM

## 2023-12-21 DIAGNOSIS — E678 Other specified hyperalimentation: Secondary | ICD-10-CM | POA: Diagnosis not present

## 2023-12-21 LAB — COMPREHENSIVE METABOLIC PANEL
ALT: 13 U/L (ref 0–53)
AST: 15 U/L (ref 0–37)
Albumin: 4.1 g/dL (ref 3.5–5.2)
Alkaline Phosphatase: 83 U/L (ref 39–117)
BUN: 29 mg/dL — ABNORMAL HIGH (ref 6–23)
CO2: 27 meq/L (ref 19–32)
Calcium: 9.3 mg/dL (ref 8.4–10.5)
Chloride: 107 meq/L (ref 96–112)
Creatinine, Ser: 1.38 mg/dL (ref 0.40–1.50)
GFR: 46.42 mL/min — ABNORMAL LOW (ref >60.00)
Glucose, Bld: 95 mg/dL (ref 70–99)
Potassium: 4.1 meq/L (ref 3.5–5.1)
Sodium: 143 meq/L (ref 135–145)
Total Bilirubin: 0.5 mg/dL (ref 0.2–1.2)
Total Protein: 6.3 g/dL (ref 6.0–8.3)

## 2023-12-21 LAB — CBC WITH DIFFERENTIAL/PLATELET
Basophils Absolute: 0.1 10*3/uL (ref 0.0–0.1)
Basophils Relative: 0.9 % (ref 0.0–3.0)
Eosinophils Absolute: 0.2 10*3/uL (ref 0.0–0.7)
Eosinophils Relative: 2.2 % (ref 0.0–5.0)
HCT: 38.2 % — ABNORMAL LOW (ref 39.0–52.0)
Hemoglobin: 13 g/dL (ref 13.0–17.0)
Lymphocytes Relative: 17.9 % (ref 12.0–46.0)
Lymphs Abs: 1.4 10*3/uL (ref 0.7–4.0)
MCHC: 34 g/dL (ref 30.0–36.0)
MCV: 89 fL (ref 78.0–100.0)
Monocytes Absolute: 0.6 10*3/uL (ref 0.1–1.0)
Monocytes Relative: 7.3 % (ref 3.0–12.0)
Neutro Abs: 5.7 10*3/uL (ref 1.4–7.7)
Neutrophils Relative %: 71.7 % (ref 43.0–77.0)
Platelets: 246 10*3/uL (ref 150.0–400.0)
RBC: 4.29 Mil/uL (ref 4.22–5.81)
RDW: 13.4 % (ref 11.5–15.5)
WBC: 8 10*3/uL (ref 4.0–10.5)

## 2023-12-21 LAB — LIPID PANEL
Cholesterol: 177 mg/dL (ref 0–200)
HDL: 42.3 mg/dL (ref >39.00)
LDL Cholesterol: 115 mg/dL — ABNORMAL HIGH (ref 0–99)
NonHDL: 134.74
Total CHOL/HDL Ratio: 4
Triglycerides: 97 mg/dL (ref 0.0–149.0)
VLDL: 19.4 mg/dL (ref 0.0–40.0)

## 2023-12-21 LAB — TSH: TSH: 0.76 u[IU]/mL (ref 0.35–5.50)

## 2023-12-21 LAB — VITAMIN B12: Vitamin B-12: 964 pg/mL — ABNORMAL HIGH (ref 211–911)

## 2023-12-21 LAB — VITAMIN D 25 HYDROXY (VIT D DEFICIENCY, FRACTURES): VITD: 92.67 ng/mL (ref 30.00–100.00)

## 2023-12-23 ENCOUNTER — Encounter: Payer: Self-pay | Admitting: Physical Therapy

## 2023-12-27 ENCOUNTER — Ambulatory Visit: Payer: Medicare Other | Admitting: Family Medicine

## 2023-12-27 ENCOUNTER — Encounter: Payer: Self-pay | Admitting: Family Medicine

## 2023-12-27 VITALS — BP 120/82 | HR 83 | Temp 97.9°F | Resp 18 | Ht 71.0 in | Wt 172.0 lb

## 2023-12-27 DIAGNOSIS — I671 Cerebral aneurysm, nonruptured: Secondary | ICD-10-CM

## 2023-12-27 DIAGNOSIS — Z8673 Personal history of transient ischemic attack (TIA), and cerebral infarction without residual deficits: Secondary | ICD-10-CM | POA: Diagnosis not present

## 2023-12-27 DIAGNOSIS — E559 Vitamin D deficiency, unspecified: Secondary | ICD-10-CM

## 2023-12-27 DIAGNOSIS — E611 Iron deficiency: Secondary | ICD-10-CM

## 2023-12-27 DIAGNOSIS — G40909 Epilepsy, unspecified, not intractable, without status epilepticus: Secondary | ICD-10-CM | POA: Diagnosis not present

## 2023-12-27 DIAGNOSIS — N400 Enlarged prostate without lower urinary tract symptoms: Secondary | ICD-10-CM

## 2023-12-27 DIAGNOSIS — I10 Essential (primary) hypertension: Secondary | ICD-10-CM

## 2023-12-27 DIAGNOSIS — E782 Mixed hyperlipidemia: Secondary | ICD-10-CM

## 2023-12-27 DIAGNOSIS — N1831 Chronic kidney disease, stage 3a: Secondary | ICD-10-CM

## 2023-12-27 DIAGNOSIS — Z7189 Other specified counseling: Secondary | ICD-10-CM | POA: Diagnosis not present

## 2023-12-27 DIAGNOSIS — N052 Unspecified nephritic syndrome with diffuse membranous glomerulonephritis: Secondary | ICD-10-CM

## 2023-12-27 DIAGNOSIS — Z0000 Encounter for general adult medical examination without abnormal findings: Secondary | ICD-10-CM | POA: Diagnosis not present

## 2023-12-27 DIAGNOSIS — R2681 Unsteadiness on feet: Secondary | ICD-10-CM

## 2023-12-27 DIAGNOSIS — G603 Idiopathic progressive neuropathy: Secondary | ICD-10-CM

## 2023-12-27 LAB — VITAMIN B1: Vitamin B1 (Thiamine): 13 nmol/L (ref 8–30)

## 2023-12-27 MED ORDER — PREGABALIN 50 MG PO CAPS: 50.0000 mg | ORAL_CAPSULE | Freq: Every day | ORAL | 11 refills | Status: DC

## 2023-12-27 MED ORDER — VITAMIN D3 25 MCG (1000 UT) PO CAPS: 1.0000 | ORAL_CAPSULE | Freq: Every day | ORAL | Status: DC

## 2023-12-27 MED ORDER — RAMIPRIL 10 MG PO CAPS: 10.0000 mg | ORAL_CAPSULE | Freq: Two times a day (BID) | ORAL | 4 refills | Status: DC

## 2023-12-27 NOTE — Progress Notes (Signed)
 Ph: [phone] Fax: [phone]   Patient ID: Mark Holmes, male    DOB: [birth year], 89 y.o.   MRN: [id number]  This visit was conducted in person.  BP 120/82   Pulse 83   Temp 97.9 F (36.6 C)   Resp 18   Ht 5' 11 (1.803 m)   Wt 172 lb (78 kg)   SpO2 95%   BMI 23.99 kg/m    CC: CPE Subjective:   HPI: Mark Holmes is a 89 y.o. male presenting on 12/27/2023 for Annual Exam   Saw health advisor 10/21/2023 for medicare wellness visit. Note reviewed.   No results found.  Flowsheet Row Office Visit from 12/27/2023 in [facility]  PHQ-2 Total Score 3          12/27/2023    3:50 PM 10/21/2023   11:02 AM 09/14/2023    8:22 AM 03/03/2023    9:34 AM 11/02/2022   10:26 AM  Fall Risk   Falls in the past year? 0 0 0 0 0  Number falls in past yr: 0 0     Injury with Fall? 0 0     Risk for fall due to : No Fall Risks No Fall Risks     Follow up Falls evaluation completed Falls prevention discussed      Significant portion of visit spent on med reconciliation, answering multiple questions about medications.   Acute/subacute stroke 06/2022 (punctate nonhemorrhagic stroke of posterior L lower midbrain presenting as sudden onset double vision and unsteadiness. MRI also showed remote lacunar infarcts of R thalamus and R cerebellum as well as 4mm PCOM aneurysm, referred to [facility] neurosurgery Dr Phil for circle of willis aneurysm - rec yearly monitoring at this time. Planned lifelong aspirin .   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  S/p R CTS release surgery 06/21/2019 - with persistent hand paresthesias.   Seizure d/o - continues dilantin  100mg  2 tab BID, per neurology he is planning to switch to lamictal.   Hyperlipidemia - atorvastatin   stopped due to myalgia.Cardiology started zetia  but patient never started this. Advised he go ahead and start.   Periph neuropathy followed by neurology. Saw [facility] neurosurgery Katrina 09/2023 - not good candidate for lumbar decompression, to consider spinal cord stimulator.   Desires to come off medications if able - discussed trying off terazosin . He continues finasteride  for known BPH.    Preventative: Colon cancer screening - has never had colonoscopy. Reassuring iFOBs previously. Decided to age out. No recent blood in stool or bowel changes. Prostate - has seen uro (Dr. Ike at [facility]) - then established locally with Dr Gaston [facility]). H/o BPH with elevated PSA s/p TURP on avodart  and terazosin .  Lung cancer screening - not eligible  Flu shot - does not receive. COVID vaccine - Pfizer 12/2019, 01/2020, booster 08/2020  Pneumovax 2011, prevnar-13 2015. Tetanus shot Td 2011.   Shingrix - h/o shingles. Declines shingrix vaccine.  Advanced directives: has living will at home. HCPOA would be daughter. Would be ok with CPR and temporary trial of life support if reversible condition. Does not want prolonged life support. Does not want copy in our chart at this time - states daughter will be available. Seat belt use discussed.  Sunscreen use discussed. No changing moles.  Non smoker  Alcohol - none  Dentist - every few years (last saw 12/2021) Eye exam - yearly  Bowel - occ constipation  Bladder - no incontinence  Caffeine: 1 cup/day, 2 excedrin/day   Married since 1959   2 grown daughters   Occupation: retired, was [employer])  Activity: walking dog, stays active with grandson (swimming)  Diet: avoids carbs, good water, fruits/vegetables daily      Relevant past medical, surgical, family and social history reviewed and updated as indicated. Interim medical history since our last visit reviewed. Allergies and medications reviewed and updated. Outpatient  Medications Prior to Visit  Medication Sig Dispense Refill   Alpha-Lipoic Acid 600 MG TABS Take 1 tablet by mouth daily. (Patient taking differently: Take 2 tablets by mouth daily.)     aspirin  EC 81 MG tablet Take 1 tablet (81 mg total) by mouth daily. Swallow whole. 30 tablet 12   aspirin -acetaminophen -caffeine (EXCEDRIN MIGRAINE) 250-250-65 MG per tablet Take 1 tablet by mouth 2 (two) times daily as needed.      finasteride  (PROSCAR ) 5 MG tablet Take 1 tablet (5 mg total) by mouth daily. 90 tablet 3   folic acid  (FOLVITE ) 400 MCG tablet Take 1 tablet (0.4 mg total) by mouth daily.     Iron , Ferrous Sulfate , 325 (65 Fe) MG TABS Take 325 mg by mouth every Monday, Wednesday, and Friday.     terazosin  (HYTRIN ) 10 MG capsule Take 1 capsule (10 mg total) by mouth daily. 90 capsule 1   Cholecalciferol (VITAMIN D3) 1.25 MG (50000 UT) TABS Take 1 tablet by mouth once a week. 12 tablet 1   Omega 3 1000 MG CAPS Take 1 capsule by mouth daily.     pregabalin  (LYRICA ) 50 MG capsule Take 1 capsule (50 mg total) by mouth at bedtime. 30 capsule 5   ramipril  (ALTACE ) 10 MG capsule Take 1 capsule (10 mg total) by mouth 2 (two) times daily. 180 capsule 1   ezetimibe  (ZETIA ) 10 MG tablet Take 1 tablet (10 mg total) by mouth daily. (Patient not taking: Reported on 12/27/2023) 90 tablet 3   No facility-administered medications prior to visit.     Per HPI unless specifically indicated in ROS section below Review of Systems  Constitutional:  Negative for activity change, appetite change, chills, fatigue, fever and unexpected weight change.  HENT:  Negative for hearing loss.   Eyes:  Negative for visual disturbance.  Respiratory:  Negative for cough, chest tightness, shortness of breath and wheezing.   Cardiovascular:  Negative for chest pain, palpitations and leg swelling.  Gastrointestinal:  Negative for abdominal distention, abdominal pain, blood in stool, constipation, diarrhea, nausea and vomiting.   Genitourinary:  Negative for difficulty urinating and hematuria.  Musculoskeletal:  Negative for arthralgias, myalgias and neck pain.       Chronic leg pain from neuropathy  Skin:  Negative for rash.  Neurological:  Negative for dizziness, seizures, syncope and headaches.  Hematological:  Negative for adenopathy. Does not bruise/bleed easily.  Psychiatric/Behavioral:  Negative for dysphoric mood. The patient is not nervous/anxious.     Objective:  BP 120/82   Pulse 83   Temp 97.9 F (36.6 C)   Resp 18   Ht 5' 11 (1.803 m)   Wt 172 lb (78 kg)   SpO2 95%   BMI 23.99 kg/m   Wt Readings from Last 3 Encounters:  12/27/23 172 lb (78 kg)  10/29/23 166 lb 4 oz (75.4 kg)  10/21/23 165 lb (74.8 kg)  Physical Exam Vitals and nursing note reviewed.  Constitutional:      General: He is not in acute distress.    Appearance: Normal appearance. He is well-developed. He is not ill-appearing.  HENT:     Head: Normocephalic and atraumatic.     Right Ear: Hearing, tympanic membrane, ear canal and external ear normal.     Left Ear: Hearing, tympanic membrane, ear canal and external ear normal.     Mouth/Throat:     Mouth: Mucous membranes are moist.     Pharynx: Oropharynx is clear. No oropharyngeal exudate or posterior oropharyngeal erythema.  Eyes:     General: No scleral icterus.    Extraocular Movements: Extraocular movements intact.     Conjunctiva/sclera: Conjunctivae normal.     Pupils: Pupils are equal, round, and reactive to light.  Neck:     Thyroid : No thyroid  mass or thyromegaly.     Vascular: No carotid bruit.  Cardiovascular:     Rate and Rhythm: Normal rate and regular rhythm.     Pulses: Normal pulses.          Radial pulses are 2+ on the right side and 2+ on the left side.     Heart sounds: Normal heart sounds. No murmur heard. Pulmonary:     Effort: Pulmonary effort is normal. No respiratory distress.     Breath sounds: Normal breath sounds. No wheezing,  rhonchi or rales.  Abdominal:     General: Bowel sounds are normal. There is no distension.     Palpations: Abdomen is soft. There is no mass.     Tenderness: There is no abdominal tenderness. There is no guarding or rebound.     Hernia: No hernia is present.  Musculoskeletal:        General: Normal range of motion.     Cervical back: Normal range of motion and neck supple.     Right lower leg: No edema.     Left lower leg: No edema.  Lymphadenopathy:     Cervical: No cervical adenopathy.  Skin:    General: Skin is warm and dry.     Findings: No rash.  Neurological:     General: No focal deficit present.     Mental Status: He is alert and oriented to person, place, and time.  Psychiatric:        Mood and Affect: Mood normal.        Behavior: Behavior normal.        Thought Content: Thought content normal.        Judgment: Judgment normal.       Results for orders placed or performed in visit on 12/21/23  TSH   Collection Time: 12/21/23  8:36 AM  Result Value Ref Range   TSH 0.76 0.35 - 5.50 uIU/mL  Vitamin B12   Collection Time: 12/21/23  8:36 AM  Result Value Ref Range   Vitamin B-12 964 (H) 211 - 911 pg/mL  Vitamin B1   Collection Time: 12/21/23  8:36 AM  Result Value Ref Range   Vitamin B1 (Thiamine) 13 8 - 30 nmol/L  CBC with Differential/Platelet   Collection Time: 12/21/23  8:36 AM  Result Value Ref Range   WBC 8.0 4.0 - 10.5 K/uL   RBC 4.29 4.22 - 5.81 Mil/uL   Hemoglobin 13.0 13.0 - 17.0 g/dL   HCT 61.7 (L) 60.9 - 47.9 %   MCV 89.0 78.0 - 100.0 fl   MCHC 34.0 30.0 - 36.0 g/dL  RDW 13.4 11.5 - 15.5 %   Platelets 246.0 150.0 - 400.0 K/uL   Neutrophils Relative % 71.7 43.0 - 77.0 %   Lymphocytes Relative 17.9 12.0 - 46.0 %   Monocytes Relative 7.3 3.0 - 12.0 %   Eosinophils Relative 2.2 0.0 - 5.0 %   Basophils Relative 0.9 0.0 - 3.0 %   Neutro Abs 5.7 1.4 - 7.7 K/uL   Lymphs Abs 1.4 0.7 - 4.0 K/uL   Monocytes Absolute 0.6 0.1 - 1.0 K/uL   Eosinophils  Absolute 0.2 0.0 - 0.7 K/uL   Basophils Absolute 0.1 0.0 - 0.1 K/uL  VITAMIN D  25 Hydroxy (Vit-D Deficiency, Fractures)   Collection Time: 12/21/23  8:36 AM  Result Value Ref Range   VITD 92.67 30.00 - 100.00 ng/mL  Comprehensive metabolic panel   Collection Time: 12/21/23  8:36 AM  Result Value Ref Range   Sodium 143 135 - 145 mEq/L   Potassium 4.1 3.5 - 5.1 mEq/L   Chloride 107 96 - 112 mEq/L   CO2 27 19 - 32 mEq/L   Glucose, Bld 95 70 - 99 mg/dL   BUN 29 (H) 6 - 23 mg/dL   Creatinine, Ser 8.61 0.40 - 1.50 mg/dL   Total Bilirubin 0.5 0.2 - 1.2 mg/dL   Alkaline Phosphatase 83 39 - 117 U/L   AST 15 0 - 37 U/L   ALT 13 0 - 53 U/L   Total Protein 6.3 6.0 - 8.3 g/dL   Albumin 4.1 3.5 - 5.2 g/dL   GFR 53.57 (L) >39.99 mL/min   Calcium  9.3 8.4 - 10.5 mg/dL  Lipid panel   Collection Time: 12/21/23  8:36 AM  Result Value Ref Range   Cholesterol 177 0 - 200 mg/dL   Triglycerides 02.9 0.0 - 149.0 mg/dL   HDL 57.69 >60.99 mg/dL   VLDL 80.5 0.0 - 59.9 mg/dL   LDL Cholesterol 884 (H) 0 - 99 mg/dL   Total CHOL/HDL Ratio 4    NonHDL 134.74    Lab Results  Component Value Date   IRON  65 06/07/2023   TIBC 222.6 (L) 06/07/2023   FERRITIN 19.1 (L) 06/07/2023    Assessment & Plan:   Problem List Items Addressed This Visit     Health maintenance examination - Primary (Chronic)   Preventative protocols reviewed and updated unless pt declined. Discussed healthy diet and lifestyle.       Advanced care planning/counseling discussion (Chronic)   Previously discussed      Mixed hyperlipidemia   Chronic, now off statin due to myalgia. He never started ezetimibe . Discussed need for cholesterol medication in stroke history - he will start ezetimibe . Unsure how much benefit he's getting from fish oil  - will try off this supplement as well.  The ASCVD Risk score (Arnett DK, et al., 2019) failed to calculate for the following reasons:   The 2019 ASCVD risk score is only valid for ages 33 to  74   Risk score cannot be calculated because patient has a medical history suggesting prior/existing ASCVD       Relevant Medications   ramipril  (ALTACE ) 10 MG capsule   Essential hypertension   Chronic, stable on current regimen - continue ramipril  10mg  BID.       Relevant Medications   ramipril  (ALTACE ) 10 MG capsule   Seizure disorder (HCC)   Stable period off dilantin  without recurrent seizure. Appreciate neurology care.       Relevant Medications   pregabalin  (LYRICA ) 50 MG  capsule   Vitamin D  deficiency   He's been taking both weekly Rx vitamin D  replacement as well as OTC 1000 international units  daily.  Recent levels high normal - will stop weekly, just continue daily dosing.       Peripheral neuropathy   Chronic, severe predominantly sensory neuropathy.  Followed by neurology.  Saw [facility] neurosurgery Dr Katrina - not good candidate for lumbar decompressive surgery Continue pregabalin  50mg  nightly, discussed option to increase dose if needed.  He also continues ALA, suggested limit to 600mg  once daily.       Relevant Medications   pregabalin  (LYRICA ) 50 MG capsule   BPH (benign prostatic hyperplasia)   S/p TURP Continues terazosin  and finasteride . Would like to try off terazosin  - unsure how much this has been helping. May restart if desired.       Unsteadiness on feet   History of ischemic stroke   Continue aspirin , start zetia .       Brain aneurysm   4mm PCOM Circle of Willis aneurysm.  Neurosurgery is monitoring this qyearly (Dr Phil at [facility]).       Relevant Medications   ramipril  (ALTACE ) 10 MG capsule   Membranous glomerulonephritis   Chronic, appreciate nephrology care.       Iron  deficiency   Low ferritin with low TIBC.  Continue oral iron  MWF.       CKD (chronic kidney disease) stage 3, GFR 30-59 ml/min (HCC)   Appreciate renal care. Latest GFR 46. Continue ramipril  10mg  BID.         Meds ordered this encounter  Medications    pregabalin  (LYRICA ) 50 MG capsule    Sig: Take 1 capsule (50 mg total) by mouth at bedtime.    Dispense:  30 capsule    Refill:  11   Cholecalciferol (VITAMIN D3) 25 MCG (1000 UT) CAPS    Sig: Take 1 capsule (1,000 Units total) by mouth daily.   ramipril  (ALTACE ) 10 MG capsule    Sig: Take 1 capsule (10 mg total) by mouth 2 (two) times daily.    Dispense:  180 capsule    Refill:  4    No orders of the defined types were placed in this encounter.   Patient Instructions  Start ezetimibe  (Zetia ).  Ok to trial off terazosin  (prostate medicine).  Try off fish oil .  Good to see you today Return in 3 months for follow up visit.   Follow up plan: Return in about 3 months (around 03/26/2024) for follow up visit.  Anton Blas, MD

## 2023-12-27 NOTE — Patient Instructions (Addendum)
 Start ezetimibe (Zetia).  Ok to trial off terazosin (prostate medicine).  Try off fish oil.  Good to see you today Return in 3 months for follow up visit.

## 2023-12-27 NOTE — Assessment & Plan Note (Signed)
 Preventative protocols reviewed and updated unless pt declined. Discussed healthy diet and lifestyle.

## 2023-12-29 ENCOUNTER — Encounter: Payer: Self-pay | Admitting: Family Medicine

## 2023-12-29 DIAGNOSIS — N183 Chronic kidney disease, stage 3 unspecified: Secondary | ICD-10-CM | POA: Insufficient documentation

## 2023-12-29 DIAGNOSIS — E611 Iron deficiency: Secondary | ICD-10-CM | POA: Insufficient documentation

## 2023-12-29 NOTE — Assessment & Plan Note (Addendum)
 Appreciate renal care. Latest GFR 46. Continue ramipril 10mg  BID.

## 2023-12-29 NOTE — Assessment & Plan Note (Addendum)
 Chronic, severe predominantly sensory neuropathy.  Followed by neurology.  Saw [facility] neurosurgery Dr Katrina - not good candidate for lumbar decompressive surgery Continue pregabalin  50mg  nightly, discussed option to increase dose if needed.  He also continues ALA, suggested limit to 600mg  once daily.

## 2023-12-29 NOTE — Assessment & Plan Note (Signed)
 Chronic, appreciate nephrology care.

## 2023-12-29 NOTE — Assessment & Plan Note (Signed)
 4mm PCOM Circle of Willis aneurysm.  Neurosurgery is monitoring this qyearly (Dr Haywood Pao at [facility]).

## 2023-12-29 NOTE — Assessment & Plan Note (Signed)
 Low ferritin with low TIBC.  Continue oral iron MWF.

## 2023-12-29 NOTE — Assessment & Plan Note (Signed)
 Stable period off dilantin without recurrent seizure. Appreciate neurology care.

## 2023-12-29 NOTE — Assessment & Plan Note (Signed)
 Previously discussed.

## 2023-12-29 NOTE — Assessment & Plan Note (Addendum)
 Continue aspirin, start zetia.

## 2023-12-29 NOTE — Assessment & Plan Note (Signed)
 Chronic, stable on current regimen - continue ramipril 10mg  BID.

## 2023-12-29 NOTE — Assessment & Plan Note (Addendum)
 S/p TURP Continues terazosin and finasteride. Would like to try off terazosin - unsure how much this has been helping. May restart if desired.

## 2023-12-29 NOTE — Assessment & Plan Note (Addendum)
 Chronic, now off statin due to myalgia. He never started ezetimibe . Discussed need for cholesterol medication in stroke history - he will start ezetimibe . Unsure how much benefit he's getting from fish oil  - will try off this supplement as well.  The ASCVD Risk score (Arnett DK, et al., 2019) failed to calculate for the following reasons:   The 2019 ASCVD risk score is only valid for ages 39 to 80   Risk score cannot be calculated because patient has a medical history suggesting prior/existing ASCVD

## 2023-12-29 NOTE — Assessment & Plan Note (Signed)
 He's been taking both weekly Rx vitamin D replacement as well as OTC 1000 international units  daily.  Recent levels high normal - will stop weekly, just continue daily dosing.

## 2024-01-07 ENCOUNTER — Encounter: Payer: Self-pay | Admitting: Family Medicine

## 2024-01-18 ENCOUNTER — Other Ambulatory Visit: Payer: Self-pay | Admitting: Neurosurgery

## 2024-01-18 ENCOUNTER — Encounter: Payer: Self-pay | Admitting: Physical Therapy

## 2024-01-18 DIAGNOSIS — I671 Cerebral aneurysm, nonruptured: Secondary | ICD-10-CM

## 2024-02-05 ENCOUNTER — Other Ambulatory Visit: Payer: Self-pay | Admitting: Family Medicine

## 2024-02-05 DIAGNOSIS — N400 Enlarged prostate without lower urinary tract symptoms: Secondary | ICD-10-CM

## 2024-02-07 ENCOUNTER — Ambulatory Visit: Payer: Medicare Other | Admitting: Urology

## 2024-02-07 VITALS — BP 166/71 | HR 92

## 2024-02-07 DIAGNOSIS — R350 Frequency of micturition: Secondary | ICD-10-CM | POA: Diagnosis not present

## 2024-02-07 LAB — URINALYSIS, COMPLETE
Bilirubin, UA: NEGATIVE
Glucose, UA: NEGATIVE
Leukocytes,UA: NEGATIVE
Nitrite, UA: NEGATIVE
RBC, UA: NEGATIVE
Specific Gravity, UA: 1.025 (ref 1.005–1.030)
Urobilinogen, Ur: 0.2 mg/dL (ref 0.2–1.0)
pH, UA: 5 (ref 5.0–7.5)

## 2024-02-07 LAB — MICROSCOPIC EXAMINATION

## 2024-02-07 MED ORDER — FINASTERIDE 5 MG PO TABS: 5.0000 mg | ORAL_TABLET | Freq: Every day | ORAL | 3 refills | Status: DC

## 2024-02-07 NOTE — Progress Notes (Signed)
02/07/2024 8:32 AM   Mark Holmes 02-21-1935 [id number]  Referring provider: Eustaquio Boyden, MD [address] [place],  [place] [address]  No chief complaint on file.   HPI: Patient saw Dr. Kathie Rhodes 1 year ago for voiding dysfunction and had a TURP in 2018.  A prior biopsy for elevated PSA was normal.  In July 2019 PSA was 0.8.  BSA was 4.552 years ago and in July 2016 was 0.51   His main complaint is slow flow and he presses down with his abdominal muscles to strain.  He gets up 3 times a night.  He has no ankle edema.  He voids infrequently during the day.  He is still on Avodart.   Clinically not infected   50 g benign prostate with hemorrhoid and mild anal stenosis    Patient will continue to get his PSA by primary care though we talked about its usefulness in his age group.  I will represcribe the Avodart.  There is really nothing else that we can do for his poor flow and unlocked in order urodynamics.  He said 2 prostate procedures over the years.  We talked about the use of an overactive bladder medication for nighttime frequency but it could theoretically worsen his flow   Avodart 90 tablets and 3 refills sent and see in 1 year.  Unfortunately the patient lost his dog Johnny in November   Stable with good days and bad days no infection or blood 40 g benign prostate with some mild anal stenosis and see in 1 year on finasteride   Today Flow stable but better after a bowel movement.  No infection.  Frequency stable.  No blood in urine 40 to 50 g benign prostate     Today Frequency stable.  Flow varies and better after bowel movement.  Had a recent renal biopsy for protein in the urine.  Today he had bacteria white blood cells and red blood cells in the urine and I sent it for culture.  His last 3 urinalysis have shown no blood.  I reviewed a recent nonenhanced CT scan November 27, 2022 and he had a small stone in each kidney and he may have had a nonobstructing less  than 2 mm stone near the left ureterovesical junction.   He is not having any flank pain.   He has a hemorrhoid.  40-50 g benign prostate.    Finasteride 90 x 3 sent to pharmacy and I will see in 1 year. 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  Frequency stable.  No infections.  Still has mild neuropathy.  Takes Hytrin 10 mg and finasteride.  I believe primary care prescribes the Hytrin  50 g benign prostate   PMH: Past Medical History:  Diagnosis Date   Benign prostatic hypertrophy with elevated PSA    per Dr. Achilles Dunk   History of CT scan of brain 1992   normal   Hyperglycemia 01/2006   102   Hyperlipemia    Hypertension    Seizure disorder [facility])     Surgical History: Past Surgical History:  Procedure Laterality Date   ANTERIOR CERVICAL DECOMP/DISCECTOMY FUSION  05/2018   for myelopathy -C3/4 ([facility])   CARPAL TUNNEL RELEASE Right 06/2019   ([facility])   CATARACT EXTRACTION W/ INTRAOCULAR LENS IMPLANT Right 2012   [facility]   CATARACT EXTRACTION W/PHACO  Left 01/14/2022   Procedure: CATARACT EXTRACTION PHACO AND INTRAOCULAR LENS PLACEMENT (IOC) LEFT;  Surgeon: Lockie Mola, MD;  Location: [facility];  Service: Ophthalmology;  Laterality: Left;  7.55 01:03.6   History of EEG  1985   normal   hospitalization  1981   observation for seizuare   TONSILLECTOMY     TRANSURETHRAL RESECTION OF PROSTATE  10/2005   [name]   TRANSURETHRAL RESECTION OF PROSTATE  08/2017   (rpt) [facility]    Home Medications:  Allergies as of 02/07/2024       Reactions   Dilaudid [hydromorphone Hcl] Other (See Comments)   hallucinations   Gabapentin Other (See Comments)   dizziness        Medication List        Accurate as of February 07, 2024  8:32 AM. If you have any questions, ask your nurse or doctor.          Alpha-Lipoic Acid 600 MG Tabs Take 1 tablet by  mouth daily. What changed: how much to take   aspirin EC 81 MG tablet Take 1 tablet (81 mg total) by mouth daily. Swallow whole.   aspirin-acetaminophen-caffeine 250-250-65 MG tablet Commonly known as: EXCEDRIN MIGRAINE Take 1 tablet by mouth 2 (two) times daily as needed.   ezetimibe 10 MG tablet Commonly known as: ZETIA Take 1 tablet (10 mg total) by mouth daily.   finasteride 5 MG tablet Commonly known as: PROSCAR Take 1 tablet (5 mg total) by mouth daily.   folic acid 400 MCG tablet Commonly known as: FOLVITE Take 1 tablet (0.4 mg total) by mouth daily.   Iron (Ferrous Sulfate) 325 (65 Fe) MG Tabs Take 325 mg by mouth every Monday, Wednesday, and Friday.   pregabalin 50 MG capsule Commonly known as: Lyrica Take 1 capsule (50 mg total) by mouth at bedtime.   ramipril 10 MG capsule Commonly known as: ALTACE Take 1 capsule (10 mg total) by mouth 2 (two) times daily.   terazosin 10 MG capsule Commonly known as: HYTRIN Take 1 capsule (10 mg total) by mouth daily.   Vitamin D3 25 MCG (1000 UT) Caps Take 1 capsule (1,000 Units total) by mouth daily.        Allergies:  Allergies  Allergen Reactions   Dilaudid [Hydromorphone Hcl] Other (See Comments)    hallucinations   Gabapentin Other (See Comments)    dizziness    Family History: Family History  Problem Relation Age of Onset   Febrile seizures Sister    Cancer Sister        lung/smoker   CAD Neg Hx     Social History:  reports that he has never smoked. He has never used smokeless tobacco. He reports that he does not drink alcohol and does not use drugs.  ROS:                                        Physical Exam: There were no vitals taken for this visit.  Constitutional:  Alert and oriented, No acute distress. HEENT: [place] AT, moist mucus membranes.  Trachea midline, no masses.  Laboratory Data: Lab Results  Component Value Date   WBC 8.0 12/21/2023   HGB 13.0 12/21/2023    HCT 38.2 (L) 12/21/2023   MCV 89.0 12/21/2023   PLT 246.0 12/21/2023    Lab Results  Component Value Date   CREATININE 1.38  12/21/2023    Lab Results  Component Value Date   PSA 0.62 07/04/2020   PSA 0.51 07/06/2019   PSA 0.81 07/01/2018    No results found for: "TESTOSTERONE"  Lab Results  Component Value Date   HGBA1C 5.4 06/21/2022    Urinalysis    Component Value Date/Time   COLORURINE YELLOW 06/03/2022 0818   APPEARANCEUR Clear 02/08/2023 0819   LABSPEC 1.020 06/03/2022 0818   PHURINE 5.5 06/03/2022 0818   GLUCOSEU Negative 02/08/2023 0819   GLUCOSEU NEGATIVE 06/03/2022 0818   HGBUR TRACE-INTACT (A) 06/03/2022 0818   BILIRUBINUR Negative 02/08/2023 0819   KETONESUR TRACE (A) 06/03/2022 0818   PROTEINUR 3+ (A) 02/08/2023 0819   UROBILINOGEN 0.2 06/03/2022 0818   NITRITE Negative 02/08/2023 0819   NITRITE NEGATIVE 06/03/2022 0818   LEUKOCYTESUR Negative 02/08/2023 0819   LEUKOCYTESUR NEGATIVE 06/03/2022 0818    Pertinent Imaging: Urine negative  Assessment & Plan: Finasteride 5 mg 90 x 3 sent to pharmacy and I will see in a year.  PSA 2023 was 0.73  1. Urinary frequency (Primary)  - Urinalysis, Complete   No follow-ups on file.  Martina Sinner, MD  [facility] [address], [address] [place], [place] [address] [phone]

## 2024-02-07 NOTE — Telephone Encounter (Signed)
Rx stopped on 10/29/23 by Dr Mariah Milling. Per 12/27/23 OV notes, pt is to stay off atorvastatin but start Zetia.   Request denied.

## 2024-02-08 ENCOUNTER — Encounter: Payer: Self-pay | Admitting: Physical Therapy

## 2024-03-08 ENCOUNTER — Encounter: Payer: Self-pay | Admitting: Family Medicine

## 2024-03-09 NOTE — Telephone Encounter (Signed)
 Spoke with pt scheduling HTN f/u OV tomorrow at 8:30.

## 2024-03-09 NOTE — Telephone Encounter (Signed)
 Please schedule OV - can he come tomorrow at 7:30am? Or 4pm?

## 2024-03-10 ENCOUNTER — Encounter: Payer: Self-pay | Admitting: Family Medicine

## 2024-03-10 ENCOUNTER — Ambulatory Visit: Admitting: Family Medicine

## 2024-03-10 VITALS — BP 156/98 | HR 76 | Temp 97.8°F | Ht 71.0 in | Wt 172.2 lb

## 2024-03-10 DIAGNOSIS — G8929 Other chronic pain: Secondary | ICD-10-CM

## 2024-03-10 DIAGNOSIS — I671 Cerebral aneurysm, nonruptured: Secondary | ICD-10-CM

## 2024-03-10 DIAGNOSIS — I10 Essential (primary) hypertension: Secondary | ICD-10-CM | POA: Diagnosis not present

## 2024-03-10 DIAGNOSIS — R011 Cardiac murmur, unspecified: Secondary | ICD-10-CM | POA: Diagnosis not present

## 2024-03-10 DIAGNOSIS — M25571 Pain in right ankle and joints of right foot: Secondary | ICD-10-CM

## 2024-03-10 DIAGNOSIS — N400 Enlarged prostate without lower urinary tract symptoms: Secondary | ICD-10-CM

## 2024-03-10 MED ORDER — DIPHENHYDRAMINE-APAP (SLEEP) 25-500 MG PO TABS: 0.5000 | ORAL_TABLET | Freq: Every day | ORAL | Status: AC

## 2024-03-10 NOTE — Assessment & Plan Note (Addendum)
 S/p TURP, stable period on finasteride and terazosin.

## 2024-03-10 NOTE — Progress Notes (Signed)
 Ph: [phone] Fax: [phone]   Patient ID: Mark Holmes, male    DOB: [birth year], 89 y.o.   MRN: [id number]  This visit was conducted in person.  BP (!) 156/98 (BP Location: Right Arm, Cuff Size: Normal)   Pulse 76   Temp 97.8 F (36.6 C) (Oral)   Ht 5\' 11"  (1.803 m)   Wt 172 lb 4 oz (78.1 kg)   SpO2 96%   BMI 24.02 kg/m   BP Readings from Last 3 Encounters:  03/10/24 (!) 156/98  02/07/24 (!) 166/71  12/27/23 120/82   CC: HTN f/u  Subjective:   HPI: Mark Holmes is a 89 y.o. male presenting on 03/10/2024 for Medical Management of Chronic Issues (Here for HTN f/u. Also, wants to discuss Zetia- not on current med list. )   See recent mychart message.  Recent elevated BP readings at home and at other provider offices.   HTN - Compliant with current antihypertensive regimen of ramipril 10mg  bid.  Also on terazosin 10mg  daily. Does check blood pressures at home: see above.  No low blood pressure readings or symptoms of dizziness/syncope.  Denies HA, vision changes, CP/tightness, SOB, leg swelling.      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   He regularly takes 1 excedrin in am and 2 excedrin at night as well as 1/2 tablet tylenol PM - finds this helps him sleep. Has done this for the past 1-2 yrs.  He's been eating more salted pretzels.   BPH - continues terazosin and proscar  Peripheral neuropathy - to consider acupuncture. He continues lyrica 50mg  at bedtime as well as ALA.   Requests new air cast brace for chronic R ankle pain. Currently using ASO ankle brace.   Heart murmur - known mild MR and AS by echocardiogram 06/2022     Relevant past medical, surgical, family and social history reviewed and updated as indicated. Interim medical history since our last visit reviewed. Allergies and medications reviewed and updated. Outpatient Medications Prior to Visit  Medication Sig Dispense Refill   Alpha-Lipoic Acid 600 MG  TABS Take 1 tablet by mouth daily. (Patient taking differently: Take 2 tablets by mouth daily.)     aspirin EC 81 MG tablet Take 1 tablet (81 mg total) by mouth daily. Swallow whole. 30 tablet 12   aspirin-acetaminophen-caffeine (EXCEDRIN MIGRAINE) 250-250-65 MG per tablet Take 1 tablet by mouth 2 (two) times daily as needed.      Cholecalciferol (VITAMIN D3) 25 MCG (1000 UT) CAPS Take 1 capsule (1,000 Units total) by mouth daily.     finasteride (PROSCAR) 5 MG tablet Take 1 tablet (5 mg total) by mouth daily. 90 tablet 3   folic acid (FOLVITE) 400 MCG tablet Take 1 tablet (0.4 mg total) by mouth daily.     Iron, Ferrous Sulfate, 325 (65 Fe) MG TABS Take 325 mg by mouth every Monday, Wednesday, and Friday.     pregabalin (LYRICA) 50 MG capsule Take 1 capsule (50 mg total) by mouth at bedtime. 30 capsule 11   ramipril (ALTACE) 10 MG capsule Take 1 capsule (10 mg total) by mouth 2 (two) times daily. 180 capsule 4   terazosin (HYTRIN) 10 MG capsule TAKE 1 CAPSULE BY MOUTH DAILY 90 capsule 3   No facility-administered medications prior to visit.     Per HPI unless specifically indicated in ROS section below Review of Systems  Objective:  BP (!) 156/98 (BP Location: Right Arm, Cuff  Size: Normal)   Pulse 76   Temp 97.8 F (36.6 C) (Oral)   Ht 5\' 11"  (1.803 m)   Wt 172 lb 4 oz (78.1 kg)   SpO2 96%   BMI 24.02 kg/m   Wt Readings from Last 3 Encounters:  03/10/24 172 lb 4 oz (78.1 kg)  12/27/23 172 lb (78 kg)  10/29/23 166 lb 4 oz (75.4 kg)      Physical Exam Vitals and nursing note reviewed.  Constitutional:      Appearance: Normal appearance. He is not ill-appearing.  HENT:     Mouth/Throat:     Mouth: Mucous membranes are dry.     Pharynx: Oropharynx is clear. No oropharyngeal exudate or posterior oropharyngeal erythema.     Comments: Mildly dry mm Eyes:     Conjunctiva/sclera: Conjunctivae normal.     Pupils: Pupils are equal, round, and reactive to light.  Cardiovascular:      Rate and Rhythm: Normal rate and regular rhythm.     Pulses: Normal pulses.     Heart sounds: Murmur (3/6 systolic) heard.  Pulmonary:     Effort: Pulmonary effort is normal. No respiratory distress.     Breath sounds: Normal breath sounds. No wheezing, rhonchi or rales.  Musculoskeletal:     Right lower leg: No edema.     Left lower leg: No edema.     Comments: R ASO brace in place  Neurological:     Mental Status: He is alert.  Psychiatric:        Mood and Affect: Mood normal.        Behavior: Behavior normal.        Assessment & Plan:   Problem List Items Addressed This Visit     Essential hypertension - Primary   Chronic, BP elevated in office and several recent readings. Discussed importance of better BP control in h/o aneurysm to circle of willis. Discussed relation of sodium to hypertension - he will cut down on salted pretzels and start checking food labels. DASH diet handout provided. Discussed relation of caffeine to hypertension - he will cut down on excedrin use (was taking 3 tablets/day).  BP log provided to track at home and send me readings in 1-2 wks. Will further titrate antihypertensives accordingly.  For now continue ramipril 10mg  bid and terazosin 10mg  daily.  Reassess at f/u visit 2 wks.       Chronic ankle pain   New AirCast brace provided.       Systolic murmur   Persistent. Reviewed latest echocardiogram showing mild MR/AS with moderate LVH.       BPH (benign prostatic hyperplasia)   S/p TURP, stable period on finasteride and terazosin.       Brain aneurysm     Meds ordered this encounter  Medications   diphenhydramine-acetaminophen (TYLENOL PM) 25-500 MG TABS tablet    Sig: Take 0.5 tablets by mouth at bedtime.    No orders of the defined types were placed in this encounter.   Patient Instructions  Continue ramipril twice daily and terazosin nightly.  Cut down on caffeine intake (Excedrin). Only take excedrin 1 in the morning, may  continue 1/2 tylenol PM at night.  Cut back on salted pretzels as well. See how this affects blood pressures. Your goal blood pressure is <140/90. Track for 1-2 weeks then send me BP readings and we will titrate medicines accordingly.  Work on low salt/sodium diet - goal <2 grams (2,000mg ) per day. Eat a  diet high in fruits/vegetables and whole grains.  Look into mediterranean and DASH diet. Goal activity is 126min/wk of moderate intensity exercise.  This can be split into 30 minute chunks.  If you are not at this level, you can start with smaller 10-15 min increments and slowly build up activity. Look at www.heart.org for more resources   Follow up plan: Return if symptoms worsen or fail to improve.  Eustaquio Boyden, MD

## 2024-03-10 NOTE — Assessment & Plan Note (Signed)
 New AirCast brace provided.

## 2024-03-10 NOTE — Assessment & Plan Note (Addendum)
 Chronic, BP elevated in office and several recent readings. Discussed importance of better BP control in h/o aneurysm to circle of willis. Discussed relation of sodium to hypertension - he will cut down on salted pretzels and start checking food labels. DASH diet handout provided. Discussed relation of caffeine to hypertension - he will cut down on excedrin use (was taking 3 tablets/day).  BP log provided to track at home and send me readings in 1-2 wks. Will further titrate antihypertensives accordingly.  For now continue ramipril 10mg  bid and terazosin 10mg  daily.  Reassess at f/u visit 2 wks.

## 2024-03-10 NOTE — Patient Instructions (Addendum)
 Continue ramipril twice daily and terazosin nightly.  Cut down on caffeine intake (Excedrin). Only take excedrin 1 in the morning, may continue 1/2 tylenol PM at night.  Cut back on salted pretzels as well. See how this affects blood pressures. Your goal blood pressure is <140/90. Track for 1-2 weeks then send me BP readings and we will titrate medicines accordingly.  Work on low salt/sodium diet - goal <2 grams (2,000mg ) per day. Eat a diet high in fruits/vegetables and whole grains.  Look into mediterranean and DASH diet. Goal activity is 167min/wk of moderate intensity exercise.  This can be split into 30 minute chunks.  If you are not at this level, you can start with smaller 10-15 min increments and slowly build up activity. Look at www.heart.org for more resources

## 2024-03-10 NOTE — Assessment & Plan Note (Addendum)
 Persistent. Reviewed latest echocardiogram showing mild MR/AS with moderate LVH.

## 2024-03-22 ENCOUNTER — Encounter: Payer: Self-pay | Admitting: Family Medicine

## 2024-03-27 ENCOUNTER — Ambulatory Visit: Payer: Medicare Other | Admitting: Family Medicine

## 2024-03-27 ENCOUNTER — Encounter: Payer: Self-pay | Admitting: Family Medicine

## 2024-03-27 VITALS — BP 156/70 | HR 82 | Temp 98.3°F | Ht 71.0 in | Wt 169.0 lb

## 2024-03-27 DIAGNOSIS — R42 Dizziness and giddiness: Secondary | ICD-10-CM | POA: Diagnosis not present

## 2024-03-27 DIAGNOSIS — I10 Essential (primary) hypertension: Secondary | ICD-10-CM | POA: Diagnosis not present

## 2024-03-27 DIAGNOSIS — G603 Idiopathic progressive neuropathy: Secondary | ICD-10-CM | POA: Diagnosis not present

## 2024-03-27 DIAGNOSIS — N400 Enlarged prostate without lower urinary tract symptoms: Secondary | ICD-10-CM | POA: Diagnosis not present

## 2024-03-27 DIAGNOSIS — E782 Mixed hyperlipidemia: Secondary | ICD-10-CM

## 2024-03-27 MED ORDER — VITAMIN B-12 1000 MCG PO TABS: 1000.0000 ug | ORAL_TABLET | ORAL | Status: AC

## 2024-03-27 MED ORDER — TERAZOSIN HCL 5 MG PO CAPS: 5.0000 mg | ORAL_CAPSULE | Freq: Every day | ORAL | 0 refills | Status: DC

## 2024-03-27 MED ORDER — TERAZOSIN HCL 5 MG PO CAPS: 5.0000 mg | ORAL_CAPSULE | Freq: Every day | ORAL | 3 refills | Status: DC

## 2024-03-27 MED ORDER — AMLODIPINE BESYLATE 5 MG PO TABS: 5.0000 mg | ORAL_TABLET | Freq: Every day | ORAL | 6 refills | Status: DC

## 2024-03-27 MED ORDER — VITAMIN D3 25 MCG (1000 UT) PO CAPS: 1.0000 | ORAL_CAPSULE | ORAL | Status: AC

## 2024-03-27 NOTE — Assessment & Plan Note (Addendum)
 s/p TURP ([facility]) Longterm on finasteride and terazosin.  Drop terazosin  dose as per above. Continue finasteride.

## 2024-03-27 NOTE — Patient Instructions (Addendum)
 Blood pressures checked both laying down and standing today - overall ok. BP is staying too high.  Drop terazosin dose to 5mg  daily - new dose sent to [facility]. Continue finasteride for prostate.  Start amlodipine 5mg  daily new BP medicine. Continue ramipril 10mg  twice daily.  Return in 6-8 weeks for hypertension follow up visit.

## 2024-03-27 NOTE — Assessment & Plan Note (Addendum)
 Chronic, BP remaining elevated.  He does endorse symptoms of orthostatic dizziness and is on terazosin - will drop from 10mg  to 5mg  and add amlodipine 5mg  daily. Continue ramipril 10mg  bid. RTC 2-3 months HTN f/u visit.

## 2024-03-27 NOTE — Assessment & Plan Note (Addendum)
 Orthostatic vital signs negative today  Drop terazosin to 5mg  as per above, reassess symptoms off this

## 2024-03-27 NOTE — Assessment & Plan Note (Signed)
 Now on zetia, tolerating well - continue this.

## 2024-03-27 NOTE — Progress Notes (Signed)
 Ph: [phone] Fax: [phone]   Patient ID: Mark Holmes, male    DOB: 21-Apr-1935, 89 y.o.   MRN: [id number]  This visit was conducted in person.  BP (!) 156/70   Pulse 82   Temp 98.3 F (36.8 C) (Oral)   Ht 5\' 11"  (1.803 m)   Wt 169 lb (76.7 kg)   SpO2 96%   BMI 23.57 kg/m   BP Readings from Last 3 Encounters:  03/27/24 (!) 156/70  03/10/24 (!) 156/98  02/07/24 (!) 166/71  Orthostatic VS for the past 72 hrs (Last 3 readings):  Orthostatic BP Patient Position  03/27/24 0923 160/82 Standing  03/27/24 0920 164/90 Supine  03/27/24 0847 160/76 --     CC: 3 mo HTN f/u visit  Subjective:   HPI: Mark Holmes is a 89 y.o. male presenting on 03/27/2024 for Medical Management of Chronic Issues (Here for 3 mo f/u. Pt provided log of recent BP readings. )   HTN - Compliant with current antihypertensive regimen of ramipril 10mg  bid, terazosin 10mg  daily. Does check blood pressures at home and brings log - markedly high BP readings as per [employer].  No low blood pressure readings or symptoms of dizziness/syncope.  Denies vision changes, CP/tightness, SOB, leg swelling.  Infrequent headaches, "heady" feeling, lightheaded with standing.   Terazosin started by urology remotely Mark Holmes).   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   Known peripheral neuropathy.   He recently started ezetimibe 2 wks ago.         Relevant past medical, surgical, family and social history reviewed and updated as indicated. Interim medical history since our last visit reviewed. Allergies and medications reviewed and updated. Outpatient Medications Prior to Visit  Medication Sig Dispense Refill   Alpha-Lipoic Acid 600 MG TABS Take 1 tablet by mouth daily. (Patient taking differently: Take 2 tablets by mouth daily.)     aspirin EC 81 MG tablet Take 1 tablet (81 mg total) by mouth daily. Swallow whole. 30 tablet 12   aspirin-acetaminophen-caffeine  (EXCEDRIN MIGRAINE) 250-250-65 MG per tablet Take 1 tablet by mouth 2 (two) times daily as needed.      diphenhydramine-acetaminophen (TYLENOL PM) 25-500 MG TABS tablet Take 0.5 tablets by mouth at bedtime.     ezetimibe (ZETIA) 10 MG tablet Take 10 mg by mouth daily.     finasteride (PROSCAR) 5 MG tablet Take 1 tablet (5 mg total) by mouth daily. 90 tablet 3   folic acid (FOLVITE) 400 MCG tablet Take 1 tablet (0.4 mg total) by mouth daily.     Iron, Ferrous Sulfate, 325 (65 Fe) MG TABS Take 325 mg by mouth every Monday, Wednesday, and Friday.     pregabalin (LYRICA) 50 MG capsule Take 1 capsule (50 mg total) by mouth at bedtime. 30 capsule 11   ramipril (ALTACE) 10 MG capsule Take 1 capsule (10 mg total) by mouth 2 (two) times daily. 180 capsule 4   Cholecalciferol (VITAMIN D3) 25 MCG (1000 UT) CAPS Take 1 capsule (1,000 Units total) by mouth daily.     cyanocobalamin (VITAMIN B12) 1000 MCG tablet Take 1,000 mcg by mouth daily.     terazosin (HYTRIN) 10 MG capsule TAKE 1 CAPSULE BY MOUTH DAILY 90 capsule 3   No facility-administered medications prior to visit.     Per HPI unless specifically indicated in ROS section below Review of Systems  Objective:  BP (!) 156/70   Pulse 82  Temp 98.3 F (36.8 C) (Oral)   Ht 5\' 11"  (1.803 m)   Wt 169 lb (76.7 kg)   SpO2 96%   BMI 23.57 kg/m   Wt Readings from Last 3 Encounters:  03/27/24 169 lb (76.7 kg)  03/10/24 172 lb 4 oz (78.1 kg)  12/27/23 172 lb (78 kg)      Physical Exam Vitals and nursing note reviewed.  Constitutional:      Appearance: Normal appearance. He is not ill-appearing.  HENT:     Mouth/Throat:     Mouth: Mucous membranes are moist.     Pharynx: Oropharynx is clear. No oropharyngeal exudate or posterior oropharyngeal erythema.  Eyes:     Extraocular Movements: Extraocular movements intact.     Pupils: Pupils are equal, round, and reactive to light.  Cardiovascular:     Rate and Rhythm: Normal rate and regular  rhythm.     Pulses: Normal pulses.     Heart sounds: Murmur (3/6 systolic USB) heard.  Pulmonary:     Effort: Pulmonary effort is normal. No respiratory distress.     Breath sounds: Normal breath sounds. No wheezing, rhonchi or rales.  Musculoskeletal:     Comments: Wearing air cast ankle brace to R leg  Skin:    General: Skin is warm and dry.     Findings: No rash.  Neurological:     Mental Status: He is alert.  Psychiatric:        Mood and Affect: Mood normal.        Behavior: Behavior normal.       Results for orders placed or performed in visit on 02/07/24  Microscopic Examination   Collection Time: 02/07/24  8:26 AM   Urine  Result Value Ref Range   WBC, UA 0-5 0 - 5 /hpf   RBC, Urine 0-2 0 - 2 /hpf   Epithelial Cells (non renal) 0-10 0 - 10 /hpf   Casts Present (A) None seen /lpf   Cast Type Hyaline casts N/A   Bacteria, UA Few None seen/Few  Urinalysis, Complete   Collection Time: 02/07/24  8:26 AM  Result Value Ref Range   Specific Gravity, UA 1.025 1.005 - 1.030   pH, UA 5.0 5.0 - 7.5   Color, UA Yellow Yellow   Appearance Ur Clear Clear   Leukocytes,UA Negative Negative   Protein,UA 2+ (A) Negative/Trace   Glucose, UA Negative Negative   Ketones, UA 1+ (A) Negative   RBC, UA Negative Negative   Bilirubin, UA Negative Negative   Urobilinogen, Ur 0.2 0.2 - 1.0 mg/dL   Nitrite, UA Negative Negative   Microscopic Examination See below:    Lab Results  Component Value Date   CHOL 177 12/21/2023   HDL 42.30 12/21/2023   LDLCALC 115 (H) 12/21/2023   LDLDIRECT 147.4 02/22/2008   TRIG 97.0 12/21/2023   CHOLHDL 4 12/21/2023   Assessment & Plan:   Problem List Items Addressed This Visit     Mixed hyperlipidemia   Now on zetia, tolerating well - continue this.       Relevant Medications   ezetimibe (ZETIA) 10 MG tablet   amLODipine (NORVASC) 5 MG tablet   terazosin (HYTRIN) 5 MG capsule   Essential hypertension - Primary   Chronic, BP remaining  elevated.  He does endorse symptoms of orthostatic dizziness and is on terazosin - will drop from 10mg  to 5mg  and add amlodipine 5mg  daily. Continue ramipril 10mg  bid. RTC 2-3 months HTN f/u visit.  Relevant Medications   ezetimibe (ZETIA) 10 MG tablet   amLODipine (NORVASC) 5 MG tablet   terazosin (HYTRIN) 5 MG capsule   Peripheral neuropathy   BPH (benign prostatic hyperplasia)   s/p TURP ([facility]) Longterm on finasteride and terazosin.  Drop terazosin  dose as per above. Continue finasteride.       Relevant Medications   terazosin (HYTRIN) 5 MG capsule   Orthostatic dizziness   Orthostatic vital signs negative today  Drop terazosin to 5mg  as per above, reassess symptoms off this         Meds ordered this encounter  Medications   cyanocobalamin (VITAMIN B12) 1000 MCG tablet    Sig: Take 1 tablet (1,000 mcg total) by mouth every Monday, Wednesday, and Friday.   Cholecalciferol (VITAMIN D3) 25 MCG (1000 UT) CAPS    Sig: Take 1 capsule (1,000 Units total) by mouth every Monday, Wednesday, and Friday.   amLODipine (NORVASC) 5 MG tablet    Sig: Take 1 tablet (5 mg total) by mouth daily.    Dispense:  30 tablet    Refill:  6   terazosin (HYTRIN) 5 MG capsule    Sig: Take 1 capsule (5 mg total) by mouth daily.    Dispense:  90 capsule    Refill:  3   DISCONTD: terazosin (HYTRIN) 5 MG capsule    Sig: Take 1 capsule (5 mg total) by mouth at bedtime.    Dispense:  30 capsule    Refill:  0    No orders of the defined types were placed in this encounter.   Patient Instructions  Blood pressures checked both laying down and standing today - overall ok. BP is staying too high.  Drop terazosin dose to 5mg  daily - new dose sent to [facility]. Continue finasteride for prostate.  Start amlodipine 5mg  daily new BP medicine. Continue ramipril 10mg  twice daily.  Return in 6-8 weeks for hypertension follow up visit.  Follow up plan: Return in about 6 weeks (around 05/08/2024) for  follow up visit.  Eustaquio Boyden, MD

## 2024-03-28 NOTE — Telephone Encounter (Signed)
 Seen in office yesterday

## 2024-04-09 ENCOUNTER — Other Ambulatory Visit: Payer: Self-pay | Admitting: Family Medicine

## 2024-04-12 NOTE — Telephone Encounter (Signed)
 Too soon. Rx sent 12/27/23, #180/4 refills to [facility] [address]  Request denied.

## 2024-05-23 ENCOUNTER — Ambulatory Visit: Admitting: Family Medicine

## 2024-05-29 ENCOUNTER — Encounter: Payer: Self-pay | Admitting: Family Medicine

## 2024-05-29 ENCOUNTER — Ambulatory Visit: Admitting: Family Medicine

## 2024-05-29 VITALS — BP 134/68 | HR 76 | Temp 97.8°F | Ht 71.0 in | Wt 168.2 lb

## 2024-05-29 DIAGNOSIS — N400 Enlarged prostate without lower urinary tract symptoms: Secondary | ICD-10-CM | POA: Diagnosis not present

## 2024-05-29 DIAGNOSIS — G603 Idiopathic progressive neuropathy: Secondary | ICD-10-CM

## 2024-05-29 DIAGNOSIS — I10 Essential (primary) hypertension: Secondary | ICD-10-CM | POA: Diagnosis not present

## 2024-05-29 MED ORDER — AMLODIPINE BESYLATE 10 MG PO TABS: 10.0000 mg | ORAL_TABLET | Freq: Every day | ORAL | 3 refills | Status: DC

## 2024-05-29 NOTE — Patient Instructions (Addendum)
 Increase amlodipine  to 10mg  daily in the morning. May double up on current 5mg  dose until you run out, new 10mg  dose tablets will be at [facility] pharmacy.  Continue ramipril  10mg  twice daily Hold terazosin  5mg  for now while we increase amlodipine , and monitor effect on blood pressure Return in 2-3 months for BP follow up.

## 2024-05-29 NOTE — Progress Notes (Unsigned)
 Ph: [phone] Fax: [phone]   Patient ID: Mark Holmes, male    DOB: 07-10-1935, 89 y.o.   MRN: [id number]  This visit was conducted in person.  BP 134/68   Pulse 76   Temp 97.8 F (36.6 C) (Oral)   Ht 5\' 11"  (1.803 m)   Wt 168 lb 4 oz (76.3 kg)   SpO2 98%   BMI 23.47 kg/m    CC: 6-8 wk f/u visit HTN  Subjective:   HPI: Mark Holmes is a 89 y.o. male presenting on 05/29/2024 for Medical Management of Chronic Issues (Here for 6-8 wk HTN f/u. Pt provided copy [to keep] of home recent BP readings.)   Brings BP log which was reviewed:    HTN - Compliant with current antihypertensive regimen of amlodipine  5mg  daily, ramipril  10mg  bid, terazosin  5mg  daily (for BPH). Newest med amlodipine  - tolerating well. Does check blood pressures at home: see above. No low blood pressure readings or symptoms of dizziness/syncope. Denies HA, vision changes, CP/tightness, SOB, leg swelling.    Ongoing periph neuropathy. Last PT session 10/2023. He asks about repeat PT referral. He continues using cane use as well as R ankle air cast brace use. Feels left ankle is getting weaker. He is intermittently using HEP previously recommended. Did recommend [facility] neuromuscular clinic eval prior to returning to PT.      Relevant past medical, surgical, family and social history reviewed and updated as indicated. Interim medical history since our last visit reviewed. Allergies and medications reviewed and updated. Outpatient Medications Prior to Visit  Medication Sig Dispense Refill   Alpha-Lipoic Acid 600 MG TABS Take 1 tablet by mouth daily. (Patient taking differently: Take 2 tablets by mouth daily.)     aspirin  EC 81 MG tablet Take 1 tablet (81 mg total) by mouth daily. Swallow whole. 30 tablet 12   aspirin -acetaminophen -caffeine (EXCEDRIN MIGRAINE) 250-250-65 MG per tablet Take 1 tablet by mouth 2 (two) times daily as needed.      Cholecalciferol (VITAMIN D3) 25 MCG (1000 UT) CAPS  Take 1 capsule (1,000 Units total) by mouth every Monday, Wednesday, and Friday.     cyanocobalamin  (VITAMIN B12) 1000 MCG tablet Take 1 tablet (1,000 mcg total) by mouth every Monday, Wednesday, and Friday.     diphenhydramine -acetaminophen  (TYLENOL  PM) 25-500 MG TABS tablet Take 0.5 tablets by mouth at bedtime.     ezetimibe  (ZETIA ) 10 MG tablet Take 10 mg by mouth daily.     finasteride  (PROSCAR ) 5 MG tablet Take 1 tablet (5 mg total) by mouth daily. 90 tablet 3   folic acid  (FOLVITE ) 400 MCG tablet Take 1 tablet (0.4 mg total) by mouth daily.     Iron , Ferrous Sulfate , 325 (65 Fe) MG TABS Take 325 mg by mouth every Monday, Wednesday, and Friday.     pregabalin  (LYRICA ) 50 MG capsule Take 1 capsule (50 mg total) by mouth at bedtime. 30 capsule 11   ramipril  (ALTACE ) 10 MG capsule Take 1 capsule (10 mg total) by mouth 2 (two) times daily. 180 capsule 4   amLODipine  (NORVASC ) 5 MG tablet Take 1 tablet (5 mg total) by mouth daily. 30 tablet 6   terazosin  (HYTRIN ) 5 MG capsule Take 1 capsule (5 mg total) by mouth daily. 90 capsule 3   No facility-administered medications prior to visit.     Per HPI unless specifically indicated in ROS section below Review of Systems  Objective:  BP 134/68   Pulse 76  Temp 97.8 F (36.6 C) (Oral)   Ht 5\' 11"  (1.803 m)   Wt 168 lb 4 oz (76.3 kg)   SpO2 98%   BMI 23.47 kg/m   Wt Readings from Last 3 Encounters:  05/29/24 168 lb 4 oz (76.3 kg)  03/27/24 169 lb (76.7 kg)  03/10/24 172 lb 4 oz (78.1 kg)      Physical Exam Vitals and nursing note reviewed.  Constitutional:      Appearance: Normal appearance. He is not ill-appearing.     Comments: Ambulates with cane  HENT:     Mouth/Throat:     Mouth: Mucous membranes are moist.     Pharynx: Oropharynx is clear. No oropharyngeal exudate or posterior oropharyngeal erythema.  Eyes:     Extraocular Movements: Extraocular movements intact.     Conjunctiva/sclera: Conjunctivae normal.     Pupils:  Pupils are equal, round, and reactive to light.  Cardiovascular:     Rate and Rhythm: Normal rate and regular rhythm.     Pulses: Normal pulses.     Heart sounds: Normal heart sounds. No murmur heard. Pulmonary:     Effort: Pulmonary effort is normal. No respiratory distress.     Breath sounds: Normal breath sounds. No wheezing, rhonchi or rales.  Musculoskeletal:     Right lower leg: No edema.     Left lower leg: No edema.     Comments: R ankle air cast brace present  Neurological:     Mental Status: He is alert.     Comments:  5/5 strength BLE  Psychiatric:        Mood and Affect: Mood normal.        Behavior: Behavior normal.       Lab Results  Component Value Date   PSA 0.62 07/04/2020   PSA 0.51 07/06/2019   PSA 0.81 07/01/2018   Lab Results  Component Value Date   NA 143 12/21/2023   CL 107 12/21/2023   K 4.1 12/21/2023   CO2 27 12/21/2023   BUN 29 (H) 12/21/2023   CREATININE 1.38 12/21/2023   GFR 46.42 (L) 12/21/2023   CALCIUM  9.3 12/21/2023   PHOS 3.9 05/19/2022   ALBUMIN 4.1 12/21/2023   GLUCOSE 95 12/21/2023    Assessment & Plan:   Problem List Items Addressed This Visit     Essential hypertension - Primary   Chronic, improving control with recent med changes, but BP still averaging high at home. Will further titrate amlodipine  to 10mg  daily, and stop terazosin .  Continue ramipril  10mg  bid.  RTC 2-3 months HTN f/u visit.       Relevant Medications   amLODipine  (NORVASC ) 10 MG tablet   Peripheral neuropathy   Ongoing chronic severe predominantly sensory neuropathy.  Appreciate [facility] neurology care.  He asks about repeat outpatient PT - recommend he f/u with [facility] neuromuscular clinic prior to return to PT.       BPH (benign prostatic hyperplasia)   S/p TURP. Continues finasteride . Monitor effect of stopping terazosin  as per above on urination         Meds ordered this encounter  Medications   amLODipine  (NORVASC ) 10 MG tablet    Sig:  Take 1 tablet (10 mg total) by mouth daily.    Dispense:  90 tablet    Refill:  3    Note new dose    No orders of the defined types were placed in this encounter.   Patient Instructions  Increase amlodipine  to 10mg   daily in the morning. May double up on current 5mg  dose until you run out, new 10mg  dose tablets will be at [facility] pharmacy.  Continue ramipril  10mg  twice daily Hold terazosin  5mg  for now while we increase amlodipine , and monitor effect on blood pressure Return in 2-3 months for BP follow up.   Follow up plan: Return in about 3 months (around 08/29/2024) for follow up visit.  Claire Crick, MD

## 2024-05-30 NOTE — Assessment & Plan Note (Signed)
 Ongoing chronic severe predominantly sensory neuropathy.  Appreciate [facility] neurology care.  He asks about repeat outpatient PT - recommend he f/u with [facility] neuromuscular clinic prior to return to PT.

## 2024-05-30 NOTE — Assessment & Plan Note (Addendum)
 Chronic, improving control with recent med changes, but BP still averaging high at home. Will further titrate amlodipine  to 10mg  daily, and stop terazosin .  Continue ramipril  10mg  bid.  RTC 2-3 months HTN f/u visit.

## 2024-05-30 NOTE — Assessment & Plan Note (Signed)
 S/p TURP. Continues finasteride . Monitor effect of stopping terazosin  as per above on urination

## 2024-06-25 ENCOUNTER — Other Ambulatory Visit: Payer: Self-pay | Admitting: Family Medicine

## 2024-06-27 NOTE — Telephone Encounter (Signed)
 ERx

## 2024-08-14 ENCOUNTER — Other Ambulatory Visit: Payer: Self-pay | Admitting: Family Medicine

## 2024-08-29 ENCOUNTER — Ambulatory Visit: Admitting: Family Medicine

## 2024-08-29 ENCOUNTER — Encounter: Payer: Self-pay | Admitting: Family Medicine

## 2024-08-29 VITALS — BP 130/70 | HR 72 | Temp 97.6°F | Ht 71.0 in | Wt 164.4 lb

## 2024-08-29 DIAGNOSIS — I671 Cerebral aneurysm, nonruptured: Secondary | ICD-10-CM | POA: Diagnosis not present

## 2024-08-29 DIAGNOSIS — I10 Essential (primary) hypertension: Secondary | ICD-10-CM | POA: Diagnosis not present

## 2024-08-29 DIAGNOSIS — N400 Enlarged prostate without lower urinary tract symptoms: Secondary | ICD-10-CM

## 2024-08-29 DIAGNOSIS — G603 Idiopathic progressive neuropathy: Secondary | ICD-10-CM

## 2024-08-29 DIAGNOSIS — N1831 Chronic kidney disease, stage 3a: Secondary | ICD-10-CM

## 2024-08-29 DIAGNOSIS — N052 Unspecified nephritic syndrome with diffuse membranous glomerulonephritis: Secondary | ICD-10-CM

## 2024-08-29 MED ORDER — RAMIPRIL 10 MG PO CAPS: 10.0000 mg | ORAL_CAPSULE | Freq: Two times a day (BID) | ORAL | 1 refills | Status: DC

## 2024-08-29 MED ORDER — FISH OIL 1000 MG PO CAPS: 1.0000 | ORAL_CAPSULE | Freq: Every day | ORAL | Status: AC

## 2024-08-29 MED ORDER — ALPHA-LIPOIC ACID 300 MG PO CAPS: 300.0000 mg | ORAL_CAPSULE | Freq: Two times a day (BID) | ORAL | Status: DC

## 2024-08-29 MED ORDER — TERAZOSIN HCL 5 MG PO CAPS: 5.0000 mg | ORAL_CAPSULE | Freq: Every day | ORAL | 1 refills | Status: DC

## 2024-08-29 NOTE — Assessment & Plan Note (Signed)
 4mm PCOM circle of willis aneurysm.  Yearly follow up - imaging already scheduled 11/2024, sees [facility] neurosurgery Dr Phil.

## 2024-08-29 NOTE — Patient Instructions (Addendum)
 Blood pressures are doing well! Continue current regimen. Ok to continue Terazosin . Return as needed or after 12/26/2024 for physical.

## 2024-08-29 NOTE — Progress Notes (Signed)
 Ph: [phone] Fax: [phone]   Patient ID: Mark Holmes, male    DOB: 1935/02/13, 89 y.o.   MRN: [id number]  This visit was conducted in person.  BP 130/70   Pulse 72   Temp 97.6 F (36.4 C) (Oral)   Ht 5' 11 (1.803 m)   Wt 164 lb 6 oz (74.6 kg)   SpO2 91%   BMI 22.93 kg/m   BP Readings from Last 3 Encounters:  08/29/24 130/70  05/29/24 134/68  03/27/24 (!) 156/70   CC: HTN f/u visit  Subjective:   HPI: Mark Holmes is a 89 y.o. male presenting on 08/29/2024 for Medical Management of Chronic Issues (PT here for F/U for HTN/)   HTN - Compliant with current antihypertensive regimen of amlodipine  10mg  daily, ramipril  10mg  bid. Last visit we stopped terazosin  - he did not stop this due to concer over persistently elevated BP readings. Does check blood pressures at home and brings log as per below - overall well controlled. No low blood pressure readings or symptoms of dizziness/syncope. Denies HA, vision changes, CP/tightness, SOB, leg swelling.    Known chronic severe predominantly sensory neuropathy. He he has been 14 months off dilantin , Ongoing symptoms to all extremities. Considering [facility] neuromuscular clinic evaluation.  CKD - followed by renal Dr Dominica, latest note reviewed. S/p renal biopsy - membranous nephropathy.  BPH - he continues finasteride  and terazosin  (see above).      Relevant past medical, surgical, family and social history reviewed and updated as indicated. Interim medical history since our last visit reviewed. Allergies and medications reviewed and updated. Outpatient Medications Prior to Visit  Medication Sig Dispense Refill   amLODipine  (NORVASC ) 10 MG tablet Take 1 tablet (10 mg total) by mouth daily. 90 tablet 3   aspirin  EC 81 MG tablet Take 1 tablet (81 mg total) by mouth daily. Swallow whole. 30 tablet 12   aspirin -acetaminophen -caffeine (EXCEDRIN MIGRAINE) 250-250-65 MG per tablet Take 1 tablet by mouth 2 (two) times  daily as needed.      Cholecalciferol (VITAMIN D3) 25 MCG (1000 UT) CAPS Take 1 capsule (1,000 Units total) by mouth every Monday, Wednesday, and Friday.     cyanocobalamin  (VITAMIN B12) 1000 MCG tablet Take 1 tablet (1,000 mcg total) by mouth every Monday, Wednesday, and Friday.     diphenhydramine -acetaminophen  (TYLENOL  PM) 25-500 MG TABS tablet Take 0.5 tablets by mouth at bedtime.     ezetimibe  (ZETIA ) 10 MG tablet Take 10 mg by mouth daily.     finasteride  (PROSCAR ) 5 MG tablet Take 1 tablet (5 mg total) by mouth daily. 90 tablet 3   folic acid  (FOLVITE ) 400 MCG tablet Take 1 tablet (0.4 mg total) by mouth daily.     Iron , Ferrous Sulfate , 325 (65 Fe) MG TABS Take 325 mg by mouth every Monday, Wednesday, and Friday.     pregabalin  (LYRICA ) 50 MG capsule TAKE 1 CAPSULE(50 MG) BY MOUTH AT BEDTIME 30 capsule 6   XIIDRA 5 % SOLN Apply 1 drop to eye 2 (two) times daily.     Alpha-Lipoic Acid 600 MG TABS Take 1 tablet by mouth daily. (Patient taking differently: Take 2 tablets by mouth daily.)     ramipril  (ALTACE ) 10 MG capsule TAKE 1 CAPSULE BY MOUTH TWICE  DAILY 180 capsule 0   omega-3 acid ethyl esters (LOVAZA) 1 g capsule Take 1 g by mouth. (Patient not taking: Reported on 08/29/2024)     No facility-administered medications prior to  visit.     Per HPI unless specifically indicated in ROS section below Review of Systems  Objective:  BP 130/70   Pulse 72   Temp 97.6 F (36.4 C) (Oral)   Ht 5' 11 (1.803 m)   Wt 164 lb 6 oz (74.6 kg)   SpO2 91%   BMI 22.93 kg/m   Wt Readings from Last 3 Encounters:  08/29/24 164 lb 6 oz (74.6 kg)  05/29/24 168 lb 4 oz (76.3 kg)  03/27/24 169 lb (76.7 kg)      Physical Exam Vitals and nursing note reviewed.  Constitutional:      Appearance: Normal appearance. He is not ill-appearing.  HENT:     Head: Normocephalic and atraumatic.     Mouth/Throat:     Mouth: Mucous membranes are moist.     Pharynx: Oropharynx is clear. No oropharyngeal  exudate or posterior oropharyngeal erythema.  Eyes:     Extraocular Movements: Extraocular movements intact.     Pupils: Pupils are equal, round, and reactive to light.  Neck:     Thyroid : No thyroid  mass or thyromegaly.  Cardiovascular:     Rate and Rhythm: Normal rate and regular rhythm.     Pulses: Normal pulses.     Heart sounds: Murmur (3/6 systolic USB) heard.  Pulmonary:     Effort: Pulmonary effort is normal. No respiratory distress.     Breath sounds: Normal breath sounds. No wheezing, rhonchi or rales.  Musculoskeletal:     Cervical back: Normal range of motion and neck supple.     Right lower leg: No edema.     Left lower leg: No edema.     Comments: Wearing R ankle aircast brace, chronically  Lymphadenopathy:     Cervical: No cervical adenopathy.  Skin:    General: Skin is warm and dry.     Findings: No rash.  Neurological:     Mental Status: He is alert.  Psychiatric:        Mood and Affect: Mood normal.        Behavior: Behavior normal.       Cr 1.59, GFR 42 (08/02/2024) Uprotein/cr ratio 144 (WNL)  Assessment & Plan:   Problem List Items Addressed This Visit     Essential hypertension - Primary   Chronic, well controlled on current regimen including terazosin  5mg  nightly based on home BP log sheet he brings- continue this.       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  Continue Lyrica  50mg  nightly.       BPH (benign prostatic hyperplasia)   S/p TURP Continue finasteride  and terazosin .      Relevant Medications   terazosin  (HYTRIN ) 5 MG capsule   Brain aneurysm   4mm PCOM circle of willis aneurysm.  Yearly follow up - imaging already scheduled 11/2024, sees [facility] neurosurgery Dr Phil.       Relevant Medications   ramipril  (ALTACE ) 10 MG capsule   terazosin  (HYTRIN ) 5 MG capsule   Membranous  glomerulonephritis   CKD (chronic kidney disease) stage 3, GFR 30-59 ml/min (HCC)   Appreciate renal care ([facility]).  Improving proteinuria on latest check - thought dilantin  related membranous glomerulonephropathy.         Meds ordered this encounter  Medications   ramipril  (ALTACE ) 10 MG capsule    Sig: Take 1 capsule (10  mg total) by mouth 2 (two) times daily.    Dispense:  180 capsule    Refill:  1   terazosin  (HYTRIN ) 5 MG capsule    Sig: Take 1 capsule (5 mg total) by mouth daily.    Dispense:  90 capsule    Refill:  1   Alpha-Lipoic Acid 300 MG CAPS    Sig: Take 1 capsule (300 mg total) by mouth in the morning and at bedtime.   Omega-3 Fatty Acids (FISH OIL ) 1000 MG CAPS    Sig: Take 1 capsule (1,000 mg total) by mouth daily.    No orders of the defined types were placed in this encounter.   Patient Instructions  Blood pressures are doing well! Continue current regimen. Ok to continue Terazosin . Return as needed or after 12/26/2024 for physical.   Follow up plan: Return in about 4 months (around 12/29/2024) for annual exam, prior fasting for blood work.  Anton Blas, MD

## 2024-08-29 NOTE — Assessment & Plan Note (Addendum)
 Chronic, well controlled on current regimen including terazosin  5mg  nightly based on home BP log sheet he brings- continue this.

## 2024-08-29 NOTE — Assessment & Plan Note (Signed)
 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  Continue Lyrica  50mg  nightly.

## 2024-08-29 NOTE — Assessment & Plan Note (Signed)
 S/p TURP Continue finasteride  and terazosin .

## 2024-08-29 NOTE — Assessment & Plan Note (Signed)
 Appreciate renal care ([facility]).  Improving proteinuria on latest check - thought dilantin  related membranous glomerulonephropathy.

## 2024-10-02 ENCOUNTER — Other Ambulatory Visit: Payer: Self-pay | Admitting: Family Medicine

## 2024-10-10 ENCOUNTER — Other Ambulatory Visit: Payer: Self-pay | Admitting: Family Medicine

## 2024-10-10 NOTE — Telephone Encounter (Unsigned)
 Copied from CRM [id number]. Topic: Clinical - Medication Refill >> Oct 10, 2024 10:56 AM [place] M wrote: Medication: ramipril  (ALTACE ) 10 MG capsule  Has the patient contacted their pharmacy? Yes (Agent: If no, request that the patient contact the pharmacy for the refill. If patient does not wish to contact the pharmacy document the reason why and proceed with request.) (Agent: If yes, when and what did the pharmacy advise?)  This is the patient's preferred pharmacy:   [facility] - [place] - [address] [facility] [address] [place] [address] [place] [address] Phone: [phone] Fax: [phone]   Is this the correct pharmacy for this prescription? Yes If no, delete pharmacy and type the correct one.   Has the prescription been filled recently? Yes  Is the patient out of the medication? Yes  Has the patient been seen for an appointment in the last year OR does the patient have an upcoming appointment? Yes  Can we respond through MyChart? Yes  Agent: Please be advised that Rx refills may take up to 3 business days. We ask that you follow-up with your pharmacy.

## 2024-10-12 MED ORDER — RAMIPRIL 10 MG PO CAPS: 10.0000 mg | ORAL_CAPSULE | Freq: Two times a day (BID) | ORAL | 1 refills | Status: DC

## 2024-10-19 ENCOUNTER — Ambulatory Visit (INDEPENDENT_AMBULATORY_CARE_PROVIDER_SITE_OTHER)

## 2024-10-19 VITALS — Ht 71.0 in | Wt 164.0 lb

## 2024-10-19 DIAGNOSIS — Z0000 Encounter for general adult medical examination without abnormal findings: Secondary | ICD-10-CM | POA: Diagnosis not present

## 2024-10-19 NOTE — Progress Notes (Signed)
 Subjective:   Mark Holmes is a 89 y.o. who presents for a Medicare Wellness preventive visit.  As a reminder, Annual Wellness Visits don't include a physical exam, and some assessments may be limited, especially if this visit is performed virtually. We may recommend an in-person follow-up visit with your provider if needed.  Visit Complete: Virtual I connected with  Mark Holmes on 10/19/24 by a audio enabled telemedicine application and verified that I am speaking with the correct person using two identifiers.  Patient Location: Home  Provider Location: Office/Clinic  I discussed the limitations of evaluation and management by telemedicine. The patient expressed understanding and agreed to proceed.  Vital Signs: Because this visit was a virtual/telehealth visit, some criteria may be missing or patient reported. Any vitals not documented were not able to be obtained and vitals that have been documented are patient reported.  VideoDeclined- This patient declined Librarian, academic. Therefore the visit was completed with audio only.  Persons Participating in Visit: Patient.  AWV Questionnaire: No: Patient Medicare AWV questionnaire was not completed prior to this visit.  Cardiac Risk Factors include: advanced age (>66men, >51 women);dyslipidemia;hypertension;male gender     Objective:    Today's Vitals   10/19/24 1507  Weight: 164 lb (74.4 kg)  Height: 5' 11 (1.803 m)   Body mass index is 22.87 kg/m.     10/19/2024    3:08 PM 10/21/2023   11:04 AM 03/24/2023    1:33 PM 10/28/2022    8:15 AM 07/16/2022   10:07 AM 06/21/2022    5:00 PM 01/14/2022   11:26 AM  Advanced Directives  Does Patient Have a Medical Advance Directive? No Yes Yes Yes Yes Yes Yes  Type of Furniture Conservator/restorer;Living will    Healthcare Power of [place] of [place];Living will  Does patient want to make changes to  medical advance directive?   No - Patient declined  Yes (MAU/Ambulatory/Procedural Areas - Information given) No - Patient declined No - Guardian declined  Copy of Healthcare Power of Attorney in Chart?  No - copy requested    No - copy requested No - copy requested  Would patient like information on creating a medical advance directive? Yes (MAU/Ambulatory/Procedural Areas - Information given)          Current Medications (verified) Outpatient Encounter Medications as of 10/19/2024  Medication Sig   Alpha-Lipoic Acid 300 MG CAPS Take 1 capsule (300 mg total) by mouth in the morning and at bedtime.   amLODipine  (NORVASC ) 10 MG tablet Take 1 tablet (10 mg total) by mouth daily.   aspirin  EC 81 MG tablet Take 1 tablet (81 mg total) by mouth daily. Swallow whole.   aspirin -acetaminophen -caffeine (EXCEDRIN MIGRAINE) 250-250-65 MG per tablet Take 1 tablet by mouth 2 (two) times daily as needed.    Cholecalciferol (VITAMIN D3) 25 MCG (1000 UT) CAPS Take 1 capsule (1,000 Units total) by mouth every Monday, Wednesday, and Friday.   cyanocobalamin  (VITAMIN B12) 1000 MCG tablet Take 1 tablet (1,000 mcg total) by mouth every Monday, Wednesday, and Friday.   diphenhydramine -acetaminophen  (TYLENOL  PM) 25-500 MG TABS tablet Take 0.5 tablets by mouth at bedtime.   ezetimibe  (ZETIA ) 10 MG tablet Take 10 mg by mouth daily.   finasteride  (PROSCAR ) 5 MG tablet Take 1 tablet (5 mg total) by mouth daily.   folic acid  (FOLVITE ) 400 MCG tablet Take 1 tablet (0.4 mg total) by mouth daily.  Iron , Ferrous Sulfate , 325 (65 Fe) MG TABS Take 325 mg by mouth every Monday, Wednesday, and Friday.   Omega-3 Fatty Acids (FISH OIL ) 1000 MG CAPS Take 1 capsule (1,000 mg total) by mouth daily.   pregabalin  (LYRICA ) 50 MG capsule TAKE 1 CAPSULE(50 MG) BY MOUTH AT BEDTIME   ramipril  (ALTACE ) 10 MG capsule Take 1 capsule (10 mg total) by mouth 2 (two) times daily.   terazosin  (HYTRIN ) 5 MG capsule Take 1 capsule (5 mg total) by  mouth daily.   XIIDRA 5 % SOLN Apply 1 drop to eye 2 (two) times daily.   No facility-administered encounter medications on file as of 10/19/2024.    Allergies (verified) Dilaudid [hydromorphone hcl] and Gabapentin    History: Past Medical History:  Diagnosis Date   Benign prostatic hypertrophy with elevated PSA    per Dr. Ike   History of CT scan of brain 1992   normal   Hyperglycemia 01/2006   102   Hyperlipemia    Hypertension    Seizure disorder [facility])    Past Surgical History:  Procedure Laterality Date   ANTERIOR CERVICAL DECOMP/DISCECTOMY FUSION  05/2018   for myelopathy -C3/4 ([facility])   CARPAL TUNNEL RELEASE Right 06/2019   ([facility])   CATARACT EXTRACTION W/ INTRAOCULAR LENS IMPLANT Right 2012   [facility]   CATARACT EXTRACTION W/PHACO Left 01/14/2022   Procedure: CATARACT EXTRACTION PHACO AND INTRAOCULAR LENS PLACEMENT (IOC) LEFT;  Surgeon: Mittie Gaskin, MD;  Location: [facility] [place] [facility];  Service: Ophthalmology;  Laterality: Left;  7.55 01:03.6   History of EEG  1985   normal   hospitalization  1981   observation for seizuare   TONSILLECTOMY     TRANSURETHRAL RESECTION OF PROSTATE  10/2005   [facility]   TRANSURETHRAL RESECTION OF PROSTATE  08/2017   (rpt) [facility]   Family History  Problem Relation Age of Onset   Febrile seizures Sister    Cancer Sister        lung/smoker   CAD Neg Hx    Social History   Socioeconomic History   Marital status: Married    Spouse name: Therapist, Art   Number of children: 2   Years of education: Not on file   Highest education level: Master's degree (e.g., MA, MS, MEng, MEd, MSW, MBA)  Occupational History   Occupation: [employer] 2001    Comment: Software Engineer  Tobacco Use   Smoking status: Never   Smokeless tobacco: Never  Vaping Use   Vaping status: Never Used  Substance and Sexual Activity   Alcohol use: No    Alcohol/week: 0.0 standard drinks of alcohol   Drug use: No   Sexual activity: Yes   Other Topics Concern   Not on file  Social History Narrative   Caffeine: 1 cup/day, 2 excedrin/day   Married since 1959   2 daughters   Occupation: retired, was Government Social Research Officer)   Activity: yardwork, stays active with grandson (swimming)   Diet: avoids carbs, good water, fruits/vegetables daily   Social Drivers of Corporate Investment Banker Strain: Low Risk  (10/19/2024)   Overall Financial Resource Strain (CARDIA)    Difficulty of Paying Living Expenses: Not hard at all  Food Insecurity: No Food Insecurity (10/19/2024)   Hunger Vital Sign    Worried About Running Out of Food in the Last Year: Never true    Ran Out of Food in the Last Year: Never true  Transportation Needs: No Transportation Needs (10/19/2024)  PRAPARE - Administrator, Civil Service (Medical): No    Lack of Transportation (Non-Medical): No  Physical Activity: Insufficiently Active (10/19/2024)   Exercise Vital Sign    Days of Exercise per Week: 3 days    Minutes of Exercise per Session: 30 min  Stress: No Stress Concern Present (10/19/2024)   [employer] of Occupational Health - Occupational Stress Questionnaire    Feeling of Stress: Not at all  Social Connections: Moderately Integrated (10/19/2024)   Social Connection and Isolation Panel    Frequency of Communication with Friends and Family: More than three times a week    Frequency of Social Gatherings with Friends and Family: More than three times a week    Attends Religious Services: More than 4 times per year    Active Member of [employer] or Organizations: No    Attends Banker Meetings: Never    Marital Status: Married    Tobacco Counseling Counseling given: No    Clinical Intake:  Pre-visit preparation completed: Yes  Pain : No/denies pain     BMI - recorded: 22.87 Nutritional Status: BMI of 19-24  Normal Nutritional Risks: None Diabetes: No  Lab Results  Component Value Date   HGBA1C 5.4  06/21/2022     How often do you need to have someone help you when you read instructions, pamphlets, or other written materials from your doctor or pharmacy?: 1 - Never  Interpreter Needed?: No  Information entered by :: Verdie Saba, CMA   Activities of Daily Living     10/19/2024    3:12 PM 10/21/2023   11:05 AM  In your present state of health, do you have any difficulty performing the following activities:  Hearing? 0 0  Comment wears hearing aids   Vision? 0 0  Difficulty concentrating or making decisions? 0 0  Walking or climbing stairs? 0 0  Dressing or bathing? 0 0  Doing errands, shopping? 0 0  Preparing Food and eating ? N N  Using the Toilet? N N  In the past six months, have you accidently leaked urine? N N  Do you have problems with loss of bowel control? N N  Managing your Medications? N N  Managing your Finances? N N  Housekeeping or managing your Housekeeping? N N    Patient Care Team: Rilla Baller, MD as PCP - General (Family Medicine) Ike Redell RAMAN, MD as Consulting Physician (Urology) Mittie Gaskin, MD as Referring Physician (Ophthalmology)  I have updated your Care Teams any recent Medical Services you may have received from other providers in the past year.     Assessment:   This is a routine wellness examination for [place].  Hearing/Vision screen Hearing Screening - Comments:: Wears hearing aids Vision Screening - Comments:: Wears rx glasses - up to date with routine eye exams with Gaskin Mittie   Goals Addressed               This Visit's Progress     Patient Stated (pt-stated)        Patient stated he's trying to manage the Neuropathy discomfort in his legs daily and plans to do more exercising       Depression Screen     10/19/2024    3:13 PM 08/29/2024    9:52 AM 05/29/2024   11:17 AM 03/10/2024    8:58 AM 12/27/2023    3:50 PM 10/21/2023   11:03 AM 09/14/2023    8:22 AM  PHQ 2/9  Scores  PHQ - 2 Score 0 0 2  0 3 0 3  PHQ- 9 Score 3 0 5 4 4  0 3    Fall Risk     10/19/2024    3:12 PM 08/29/2024    9:52 AM 05/29/2024   11:17 AM 12/27/2023    3:50 PM 10/21/2023   11:02 AM  Fall Risk   Falls in the past year? 0 0 0 0 0  Number falls in past yr: 0 0  0 0  Injury with Fall? 0 0  0 0  Risk for fall due to : No Fall Risks No Fall Risks  No Fall Risks No Fall Risks  Follow up Falls evaluation completed;Falls prevention discussed Falls evaluation completed  Falls evaluation completed Falls prevention discussed    MEDICARE RISK AT HOME:  Medicare Risk at Home Any stairs in or around the home?: Yes If so, are there any without handrails?: No Home free of loose throw rugs in walkways, pet beds, electrical cords, etc?: Yes Adequate lighting in your home to reduce risk of falls?: Yes Life alert?: No Use of a cane, walker or w/c?: Yes (cane) Grab bars in the bathroom?: No Shower chair or bench in shower?: No Elevated toilet seat or a handicapped toilet?: Yes  TIMED UP AND GO:  Was the test performed?  No  Cognitive Function: 6CIT completed        10/19/2024    3:15 PM 10/21/2023   11:05 AM  6CIT Screen  What Year? 0 points 0 points  What month? 0 points 0 points  What time? 0 points 0 points  Count back from 20 0 points 0 points  Months in reverse 0 points 0 points  Repeat phrase 0 points 0 points  Total Score 0 points 0 points    Immunizations Immunization History  Administered Date(s) Administered   PFIZER(Purple Top)SARS-COV-2 Vaccination 01/04/2020, 01/25/2020, 09/17/2020   Pneumococcal Conjugate-13 06/18/2014   Pneumococcal Polysaccharide-23 03/19/2010   Td 12/21/1996, 03/19/2010    Screening Tests Health Maintenance  Topic Date Due   Zoster Vaccines- Shingrix (1 of 2) Never done   DTaP/Tdap/Td (3 - Tdap) 03/19/2020   COVID-19 Vaccine (4 - 2025-26 season) 08/21/2024   Influenza Vaccine  03/20/2025 (Originally 07/21/2024)   Medicare Annual Wellness (AWV)  10/19/2025    Pneumococcal Vaccine: 50+ Years  Completed   Meningococcal B Vaccine  Aged Out    Health Maintenance Items Addressed:  I have recommended that this patient have a immunization for Influenza, Shingles, and the Tdap but he declines at this time. I have discussed the risks and benefits of this procedure with him. The patient verbalizes understanding.   Additional Screening:  Vision Screening: Recommended annual ophthalmology exams for early detection of glaucoma and other disorders of the eye. Is the patient up to date with their annual eye exam?  Yes  Who is the provider or what is the name of the office in which the patient attends annual eye exams? [facility]  Dental Screening: Recommended annual dental exams for proper oral hygiene  Community Resource Referral / Chronic Care Management: CRR required this visit?  No   CCM required this visit?  No   Plan:    I have personally reviewed and noted the following in the patient's chart:   Medical and social history Use of alcohol, tobacco or illicit drugs  Current medications and supplements including opioid prescriptions. Patient is not currently taking opioid prescriptions. Functional ability and status  Nutritional status Physical activity Advanced directives List of other physicians Hospitalizations, surgeries, and ER visits in previous 12 months Vitals Screenings to include cognitive, depression, and falls Referrals and appointments  In addition, I have reviewed and discussed with patient certain preventive protocols, quality metrics, and best practice recommendations. A written personalized care plan for preventive services as well as general preventive health recommendations were provided to patient.   Verdie CHRISTELLA Saba, CMA   10/19/2024   After Visit Summary: (MyChart) Due to this being a telephonic visit, the after visit summary with patients personalized plan was offered to patient via MyChart   Notes: Nothing  significant to report at this time.

## 2024-10-19 NOTE — Patient Instructions (Signed)
 Mr. Gervasi,  Thank you for taking the time for your Medicare Wellness Visit. I appreciate your continued commitment to your health goals. Please review the care plan we discussed, and feel free to reach out if I can assist you further.  Medicare recommends these wellness visits once per year to help you and your care team stay ahead of potential health issues. These visits are designed to focus on prevention, allowing your provider to concentrate on managing your acute and chronic conditions during your regular appointments.  Please note that Annual Wellness Visits do not include a physical exam. Some assessments may be limited, especially if the visit was conducted virtually. If needed, we may recommend a separate in-person follow-up with your provider.  Ongoing Care Seeing your primary care provider every 3 to 6 months helps us  monitor your health and provide consistent, personalized care.   Referrals If a referral was made during today's visit and you haven't received any updates within two weeks, please contact the referred provider directly to check on the status.  Recommended Screenings:  Health Maintenance  Topic Date Due   Zoster (Shingles) Vaccine (1 of 2) Never done   DTaP/Tdap/Td vaccine (3 - Tdap) 03/19/2020   COVID-19 Vaccine (4 - 2025-26 season) 08/21/2024   Medicare Annual Wellness Visit  10/20/2024   Flu Shot  03/20/2025*   Pneumococcal Vaccine for age over 70  Completed   Meningitis B Vaccine  Aged Out  *Topic was postponed. The date shown is not the original due date.       10/19/2024    3:08 PM  Advanced Directives  Does Patient Have a Medical Advance Directive? No  Would patient like information on creating a medical advance directive? Yes (MAU/Ambulatory/Procedural Areas - Information given)   Advance Care Planning is important because it: Ensures you receive medical care that aligns with your values, goals, and preferences. Provides guidance to your  family and loved ones, reducing the emotional burden of decision-making during critical moments.  Vision: Annual vision screenings are recommended for early detection of glaucoma, cataracts, and diabetic retinopathy. These exams can also reveal signs of chronic conditions such as diabetes and high blood pressure.  Dental: Annual dental screenings help detect early signs of oral cancer, gum disease, and other conditions linked to overall health, including heart disease and diabetes.

## 2024-11-06 NOTE — Progress Notes (Unsigned)
 Cardiology Office Note  Date:  11/07/2024   ID:  [name], DOB [birth year], MRN [id number]  PCP:  Rilla Baller, MD   Chief Complaint  Patient presents with   12 month follow up     Doing well.     HPI:  Mark Holmes is a 89 y.o. 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, cervical radiculopathy status post ACDF C3-C4, hypertension hyperlipidemia,  History of stroke Coronary calcification on CT scan Statin myalgias who is being seen in clinic for follow-up of his cardiac risk factors  Last seen by myself in clinic November 2024 Married, active at home Reports he walks the dog for activity, walks in the house up and down the stairs Sits at the computer, walks with a cane, no falls  History of statin myalgias on Lipitor 40 Was started on Zetia  No lipids since that time  Previously seen by neurosurgery, not a good candidate for surgery given multilevel disease Recommendation made to consider spinal cord stimulator  No chest pain or shortness of breath Legs feel tight, stiff, does not do regular stretching  EKG personally reviewed by myself on todays visit EKG Interpretation Date/Time:  Tuesday November 07 2024 08:56:33 EST Ventricular Rate:  77 PR Interval:  152 QRS Duration:  72 QT Interval:  386 QTC Calculation: 436 R Axis:   1  Text Interpretation: Sinus rhythm with marked sinus arrhythmia When compared with ECG of 29-Oct-2023 10:32, No significant change was found Confirmed by Perla Lye [id number]) on 11/07/2024 9:05:12 AM    in the hospital July 2021 for chest pain, felt to be musculoskeletal  hospital July 2023 double vision Hospitalized, brain MRI showed subacute nonhemorrhagic infarct in the posterior left lower midbrain and 4th cranial nerve nucleus, (positive) right cerebellum, atrophy and white matter disease likely reflecting sequela of chronic microvascular ischemia.   He was  evaluated by neurosurgery for his brainstem stroke.   Vascular imaging demonstrated a left-sided aneurysm.  It was decided at that time it was no intervention needed from neurosurgery   placed on a ZIO monitor with concern of his brainstem stroke   no atrial fibrillation     protein in his urine so had his ACEi dose increased.    PMH:   has a past medical history of Benign prostatic hypertrophy with elevated PSA, History of CT scan of brain (1992), Hyperglycemia (01/2006), Hyperlipemia, Hypertension, and Seizure disorder (HCC).  PSH:    Past Surgical History:  Procedure Laterality Date   ANTERIOR CERVICAL DECOMP/DISCECTOMY FUSION  05/2018   for myelopathy -C3/4 ([facility])   CARPAL TUNNEL RELEASE Right 06/2019   ([facility])   CATARACT EXTRACTION W/ INTRAOCULAR LENS IMPLANT Right 2012   [facility]   CATARACT EXTRACTION W/PHACO Left 01/14/2022   Procedure: CATARACT EXTRACTION PHACO AND INTRAOCULAR LENS PLACEMENT (IOC) LEFT;  Surgeon: Mittie Gaskin, MD;  Location: [facility];  Service: Ophthalmology;  Laterality: Left;  7.55 01:03.6   History of EEG  1985   normal   hospitalization  1981   observation for seizuare   TONSILLECTOMY     TRANSURETHRAL RESECTION OF PROSTATE  10/2005   [facility]   TRANSURETHRAL RESECTION OF PROSTATE  08/2017   (rpt) [facility]    Current Outpatient Medications  Medication Sig Dispense Refill   Alpha-Lipoic Acid 300 MG CAPS Take 1 capsule (300 mg total) by mouth in the morning and at bedtime.  amLODipine  (NORVASC ) 10 MG tablet Take 1 tablet (10 mg total) by mouth daily. 90 tablet 3   aspirin  EC 81 MG tablet Take 1 tablet (81 mg total) by mouth daily. Swallow whole. 30 tablet 12   aspirin -acetaminophen -caffeine (EXCEDRIN MIGRAINE) 250-250-65 MG per tablet Take 1 tablet by mouth 2 (two) times daily as needed.      Cholecalciferol (VITAMIN D3) 25 MCG (1000 UT) CAPS Take 1 capsule (1,000 Units total) by mouth every Monday, Wednesday, and  Friday.     cyanocobalamin  (VITAMIN B12) 1000 MCG tablet Take 1 tablet (1,000 mcg total) by mouth every Monday, Wednesday, and Friday.     diphenhydramine -acetaminophen  (TYLENOL  PM) 25-500 MG TABS tablet Take 0.5 tablets by mouth at bedtime.     ezetimibe  (ZETIA ) 10 MG tablet Take 10 mg by mouth daily.     finasteride  (PROSCAR ) 5 MG tablet Take 1 tablet (5 mg total) by mouth daily. 90 tablet 3   folic acid  (FOLVITE ) 400 MCG tablet Take 1 tablet (0.4 mg total) by mouth daily.     Iron , Ferrous Sulfate , 325 (65 Fe) MG TABS Take 325 mg by mouth every Monday, Wednesday, and Friday.     Omega-3 Fatty Acids (FISH OIL ) 1000 MG CAPS Take 1 capsule (1,000 mg total) by mouth daily.     Polyethylene Glycol 400 (BLINK TEARS) 0.25 % SOLN Apply to eye.     pregabalin  (LYRICA ) 50 MG capsule TAKE 1 CAPSULE(50 MG) BY MOUTH AT BEDTIME 30 capsule 6   ramipril  (ALTACE ) 10 MG capsule Take 1 capsule (10 mg total) by mouth 2 (two) times daily. 180 capsule 1   terazosin  (HYTRIN ) 5 MG capsule Take 1 capsule (5 mg total) by mouth daily. 90 capsule 1   XIIDRA 5 % SOLN Apply 1 drop to eye 2 (two) times daily. (Patient not taking: Reported on 11/07/2024)     No current facility-administered medications for this visit.    Allergies:   Dilaudid [hydromorphone hcl] and Gabapentin    Social History:  The patient  reports that he has never smoked. He has never used smokeless tobacco. He reports that he does not drink alcohol and does not use drugs.   Family History:   family history includes Cancer in his sister; Febrile seizures in his sister.    Review of Systems: Review of Systems  Constitutional: Negative.   HENT: Negative.    Respiratory: Negative.    Cardiovascular: Negative.   Gastrointestinal: Negative.   Musculoskeletal: Negative.        Leg pain  Neurological: Negative.   Psychiatric/Behavioral: Negative.    All other systems reviewed and are negative.    PHYSICAL EXAM: VS:  BP 110/64 (BP Location:  Left Arm, Patient Position: Sitting, Cuff Size: Normal)   Pulse 77   Ht 5' 11 (1.803 m)   Wt 163 lb 6 oz (74.1 kg)   SpO2 94%   BMI 22.79 kg/m  , BMI Body mass index is 22.79 kg/m. Constitutional:  oriented to person, place, and time. No distress.  HENT:  Head: Grossly normal Eyes:  no discharge. No scleral icterus.  Neck: No JVD, no carotid bruits  Cardiovascular: Regular rate and rhythm, no murmurs appreciated Pulmonary/Chest: Clear to auscultation bilaterally, no wheezes or rales Abdominal: Soft.  no distension.  no tenderness.  Musculoskeletal: Normal range of motion Neurological:  normal muscle tone. Coordination normal. No atrophy Skin: Skin warm and dry Psychiatric: normal affect, pleasant  Recent Labs: 12/21/2023: ALT 13; BUN 29; Creatinine, Ser  1.38; Hemoglobin 13.0; Platelets 246.0; Potassium 4.1; Sodium 143; TSH 0.76    Lipid Panel Lab Results  Component Value Date   CHOL 177 12/21/2023   HDL 42.30 12/21/2023   LDLCALC 115 (H) 12/21/2023   TRIG 97.0 12/21/2023      Wt Readings from Last 3 Encounters:  11/07/24 163 lb 6 oz (74.1 kg)  10/19/24 164 lb (74.4 kg)  08/29/24 164 lb 6 oz (74.6 kg)     ASSESSMENT AND PLAN:  Problem List Items Addressed This Visit       Cardiology Problems   Essential hypertension   Relevant Orders   EKG 12-Lead (Completed)   Mixed hyperlipidemia     Other   CKD (chronic kidney disease) stage 3, GFR 30-59 ml/min (HCC)   Other Visit Diagnoses       Aortic atherosclerosis    -  Primary   Relevant Orders   EKG 12-Lead (Completed)     Coronary artery calcification       Relevant Orders   EKG 12-Lead (Completed)     Cerebrovascular accident (CVA), unspecified mechanism (HCC)       Relevant Orders   EKG 12-Lead (Completed)     Peripheral edema           Coronary artery calcification and aortic atherosclerosis  noted on CT.  Currently with no symptoms of angina. No further workup at this time. Continue current  medication regimen. Lipid panel ordered today on Zetia , did not tolerate Lipitor 40 in the past EKG unchanged, asymptomatic, on aspirin    Essential hypertension   ramipril  10 mg twice daily,  Amlodipine  10 mg daily Blood pressure is well controlled on today's visit. No changes made to the medications.  Mixed hyperlipidemia  Off Lipitor, started on Zetia  10 mg daily as alternative, will check lipid panel today  Previous CVA with residual defects.   Previously was on aspirin   Off Plavix , off Lipitor secondary to myalgia Zio monitor with no atrial fibrillation  incidental brain aneurysm  on screening.  Repeat screening shows a reduction in size status stable.  Surveillance monitoring per neurosurgery.   Seizure disorder.   Weaned off Dilantin  , no seizures  6. Neuropathy Better feeling in his hands off dilantin  Has stiff legs, recommended stretching exercises daily and walking for balance   Signed, Velinda Lunger, M.D., Ph.D. [facility] [place], [place] [phone]

## 2024-11-07 ENCOUNTER — Encounter: Payer: Self-pay | Admitting: Cardiovascular Disease

## 2024-11-07 ENCOUNTER — Other Ambulatory Visit
Admission: RE | Admit: 2024-11-07 | Discharge: 2024-11-07 | Disposition: A | Discharge status: Home / Self Care | Source: Ambulatory Visit | Attending: Cardiovascular Disease | Admitting: Cardiovascular Disease

## 2024-11-07 ENCOUNTER — Ambulatory Visit: Attending: Cardiovascular Disease | Admitting: Cardiovascular Disease

## 2024-11-07 VITALS — BP 110/64 | HR 77 | Ht 71.0 in | Wt 163.4 lb

## 2024-11-07 DIAGNOSIS — M791 Myalgia, unspecified site: Secondary | ICD-10-CM

## 2024-11-07 DIAGNOSIS — I700 Atherosclerosis of aorta: Secondary | ICD-10-CM

## 2024-11-07 DIAGNOSIS — E782 Mixed hyperlipidemia: Secondary | ICD-10-CM | POA: Insufficient documentation

## 2024-11-07 DIAGNOSIS — I10 Essential (primary) hypertension: Secondary | ICD-10-CM

## 2024-11-07 DIAGNOSIS — I639 Cerebral infarction, unspecified: Secondary | ICD-10-CM | POA: Diagnosis not present

## 2024-11-07 DIAGNOSIS — R600 Localized edema: Secondary | ICD-10-CM

## 2024-11-07 DIAGNOSIS — I2510 Atherosclerotic heart disease of native coronary artery without angina pectoris: Secondary | ICD-10-CM | POA: Diagnosis not present

## 2024-11-07 DIAGNOSIS — N1831 Chronic kidney disease, stage 3a: Secondary | ICD-10-CM

## 2024-11-07 DIAGNOSIS — T466X5A Adverse effect of antihyperlipidemic and antiarteriosclerotic drugs, initial encounter: Secondary | ICD-10-CM

## 2024-11-07 LAB — LIPID PANEL
Cholesterol: 182 mg/dL (ref 0–200)
HDL: 43 mg/dL (ref >40)
LDL Cholesterol: 116 mg/dL — ABNORMAL HIGH (ref 0–99)
Total CHOL/HDL Ratio: 4.2 ratio
Triglycerides: 116 mg/dL (ref <150)
VLDL: 23 mg/dL (ref 0–40)

## 2024-11-07 NOTE — Patient Instructions (Addendum)
 Medication Instructions:  No changes  If you need a refill on your cardiac medications before your next appointment, please call your pharmacy.   Lab work: Your provider would like for you to have following labs drawn today Lipid Panel.    Testing/Procedures: No new testing needed  Follow-Up: At [facility] you and your health needs are our priority.  As part of our continuing mission to provide you with exceptional heart care, we have created designated Provider Care Teams.  These Care Teams include your primary Cardiologist (physician) and Advanced Practice Providers (APPs -  Physician Assistants and Nurse Practitioners) who all work together to provide you with the care you need, when you need it.  You will need a follow up appointment in 12 months  Providers on your designated Care Team:   Lonni Meager, NP Bernardino Bring, PA-C Cadence [place], [place]  COVID-19 Vaccine Information can be found at: [email or web address] For questions related to vaccine distribution or appointments, please email [email or web address] or call [phone].

## 2024-11-12 ENCOUNTER — Ambulatory Visit: Payer: Self-pay | Admitting: Cardiovascular Disease

## 2024-11-14 MED ORDER — ROSUVASTATIN CALCIUM 5 MG PO TABS: 5.0000 mg | ORAL_TABLET | Freq: Every day | ORAL | 3 refills | Status: AC

## 2024-11-20 ENCOUNTER — Encounter: Payer: Self-pay | Admitting: Family Medicine

## 2024-11-20 NOTE — Telephone Encounter (Signed)
 Suggest stopping terazosin  in pm.

## 2024-12-11 ENCOUNTER — Inpatient Hospital Stay
Admission: RE | Admit: 2024-12-11 | Discharge: 2024-12-11 | Disposition: A | Payer: Medicare Other | Source: Ambulatory Visit | Attending: Neurosurgery | Admitting: Neurosurgery

## 2024-12-11 DIAGNOSIS — I671 Cerebral aneurysm, nonruptured: Secondary | ICD-10-CM

## 2024-12-11 MED ORDER — IOPAMIDOL (ISOVUE-370) INJECTION 76%
75.0000 mL | Freq: Once | INTRAVENOUS | Status: AC | PRN
  Administered 2024-12-11: 75 mL via INTRAVENOUS

## 2024-12-21 ENCOUNTER — Encounter: Payer: Self-pay | Admitting: Cardiovascular Disease

## 2024-12-22 ENCOUNTER — Other Ambulatory Visit: Payer: Self-pay | Admitting: *Deleted

## 2024-12-22 MED ORDER — EZETIMIBE 10 MG PO TABS: 10.0000 mg | ORAL_TABLET | Freq: Every day | ORAL | 3 refills | Status: AC

## 2024-12-23 ENCOUNTER — Other Ambulatory Visit: Payer: Self-pay | Admitting: Family Medicine

## 2024-12-23 DIAGNOSIS — R809 Proteinuria, unspecified: Secondary | ICD-10-CM

## 2024-12-23 DIAGNOSIS — G40909 Epilepsy, unspecified, not intractable, without status epilepticus: Secondary | ICD-10-CM

## 2024-12-23 DIAGNOSIS — E611 Iron deficiency: Secondary | ICD-10-CM

## 2024-12-23 DIAGNOSIS — N052 Unspecified nephritic syndrome with diffuse membranous glomerulonephritis: Secondary | ICD-10-CM

## 2024-12-23 DIAGNOSIS — E559 Vitamin D deficiency, unspecified: Secondary | ICD-10-CM

## 2024-12-23 DIAGNOSIS — N1831 Chronic kidney disease, stage 3a: Secondary | ICD-10-CM

## 2024-12-23 DIAGNOSIS — E782 Mixed hyperlipidemia: Secondary | ICD-10-CM

## 2024-12-26 ENCOUNTER — Other Ambulatory Visit (INDEPENDENT_AMBULATORY_CARE_PROVIDER_SITE_OTHER)

## 2024-12-26 DIAGNOSIS — E782 Mixed hyperlipidemia: Secondary | ICD-10-CM | POA: Diagnosis not present

## 2024-12-26 DIAGNOSIS — N052 Unspecified nephritic syndrome with diffuse membranous glomerulonephritis: Secondary | ICD-10-CM

## 2024-12-26 DIAGNOSIS — N1831 Chronic kidney disease, stage 3a: Secondary | ICD-10-CM

## 2024-12-26 DIAGNOSIS — E559 Vitamin D deficiency, unspecified: Secondary | ICD-10-CM | POA: Diagnosis not present

## 2024-12-26 DIAGNOSIS — E611 Iron deficiency: Secondary | ICD-10-CM | POA: Diagnosis not present

## 2024-12-26 LAB — COMPREHENSIVE METABOLIC PANEL WITH GFR
ALT: 14 U/L (ref 3–53)
AST: 16 U/L (ref 5–37)
Albumin: 4.1 g/dL (ref 3.5–5.2)
Alkaline Phosphatase: 73 U/L (ref 39–117)
BUN: 30 mg/dL — ABNORMAL HIGH (ref 6–23)
CO2: 29 meq/L (ref 19–32)
Calcium: 9.3 mg/dL (ref 8.4–10.5)
Chloride: 105 meq/L (ref 96–112)
Creatinine, Ser: 1.37 mg/dL (ref 0.40–1.50)
GFR: 46.49 mL/min — ABNORMAL LOW
Glucose, Bld: 92 mg/dL (ref 70–99)
Potassium: 3.6 meq/L (ref 3.5–5.1)
Sodium: 141 meq/L (ref 135–145)
Total Bilirubin: 0.4 mg/dL (ref 0.2–1.2)
Total Protein: 6.5 g/dL (ref 6.0–8.3)

## 2024-12-26 LAB — IBC PANEL
Iron: 97 ug/dL (ref 42–165)
Saturation Ratios: 40.3 % (ref 20.0–50.0)
TIBC: 240.8 ug/dL — ABNORMAL LOW (ref 250.0–450.0)
Transferrin: 172 mg/dL — ABNORMAL LOW (ref 212.0–360.0)

## 2024-12-26 LAB — CBC WITH DIFFERENTIAL/PLATELET
Basophils Absolute: 0.1 K/uL (ref 0.0–0.1)
Basophils Relative: 0.7 % (ref 0.0–3.0)
Eosinophils Absolute: 0.2 K/uL (ref 0.0–0.7)
Eosinophils Relative: 2.5 % (ref 0.0–5.0)
HCT: 35.8 % — ABNORMAL LOW (ref 39.0–52.0)
Hemoglobin: 12.3 g/dL — ABNORMAL LOW (ref 13.0–17.0)
Lymphocytes Relative: 15.3 % (ref 12.0–46.0)
Lymphs Abs: 1.4 K/uL (ref 0.7–4.0)
MCHC: 34.3 g/dL (ref 30.0–36.0)
MCV: 86.4 fl (ref 78.0–100.0)
Monocytes Absolute: 0.7 K/uL (ref 0.1–1.0)
Monocytes Relative: 8 % (ref 3.0–12.0)
Neutro Abs: 6.6 K/uL (ref 1.4–7.7)
Neutrophils Relative %: 73.5 % (ref 43.0–77.0)
Platelets: 275 K/uL (ref 150.0–400.0)
RBC: 4.14 Mil/uL — ABNORMAL LOW (ref 4.22–5.81)
RDW: 13.6 % (ref 11.5–15.5)
WBC: 9 K/uL (ref 4.0–10.5)

## 2024-12-26 LAB — LIPID PANEL
Cholesterol: 101 mg/dL (ref 28–200)
HDL: 41.3 mg/dL
LDL Cholesterol: 41 mg/dL (ref 10–99)
NonHDL: 59.8
Total CHOL/HDL Ratio: 2
Triglycerides: 96 mg/dL (ref 10.0–149.0)
VLDL: 19.2 mg/dL (ref 0.0–40.0)

## 2024-12-26 LAB — MICROALBUMIN / CREATININE URINE RATIO
Creatinine,U: 225.4 mg/dL
Microalb Creat Ratio: 94.1 mg/g — ABNORMAL HIGH (ref 0.0–30.0)
Microalb, Ur: 21.2 mg/dL — ABNORMAL HIGH (ref 0.7–1.9)

## 2024-12-26 LAB — VITAMIN D 25 HYDROXY (VIT D DEFICIENCY, FRACTURES): VITD: 39.75 ng/mL (ref 30.00–100.00)

## 2024-12-26 LAB — FERRITIN: Ferritin: 39.9 ng/mL (ref 22.0–322.0)

## 2024-12-26 LAB — PHOSPHORUS: Phosphorus: 3.1 mg/dL (ref 2.3–4.6)

## 2024-12-27 ENCOUNTER — Ambulatory Visit: Payer: Self-pay | Admitting: Family Medicine

## 2024-12-27 LAB — PARATHYROID HORMONE, INTACT (NO CA): PTH: 34 pg/mL (ref 16–77)

## 2024-12-29 ENCOUNTER — Other Ambulatory Visit: Payer: Self-pay

## 2024-12-29 DIAGNOSIS — R350 Frequency of micturition: Secondary | ICD-10-CM

## 2024-12-29 MED ORDER — FINASTERIDE 5 MG PO TABS: 5.0000 mg | ORAL_TABLET | Freq: Every day | ORAL | 0 refills | Status: AC

## 2025-01-02 ENCOUNTER — Ambulatory Visit: Admitting: Family Medicine

## 2025-01-02 ENCOUNTER — Encounter: Payer: Self-pay | Admitting: Family Medicine

## 2025-01-02 VITALS — BP 130/80 | HR 83 | Temp 97.6°F | Ht 71.0 in | Wt 166.8 lb

## 2025-01-02 DIAGNOSIS — I10 Essential (primary) hypertension: Secondary | ICD-10-CM

## 2025-01-02 DIAGNOSIS — R2681 Unsteadiness on feet: Secondary | ICD-10-CM

## 2025-01-02 DIAGNOSIS — R011 Cardiac murmur, unspecified: Secondary | ICD-10-CM | POA: Diagnosis not present

## 2025-01-02 DIAGNOSIS — G603 Idiopathic progressive neuropathy: Secondary | ICD-10-CM

## 2025-01-02 DIAGNOSIS — R809 Proteinuria, unspecified: Secondary | ICD-10-CM | POA: Diagnosis not present

## 2025-01-02 DIAGNOSIS — Z8673 Personal history of transient ischemic attack (TIA), and cerebral infarction without residual deficits: Secondary | ICD-10-CM

## 2025-01-02 DIAGNOSIS — N1831 Chronic kidney disease, stage 3a: Secondary | ICD-10-CM | POA: Diagnosis not present

## 2025-01-02 DIAGNOSIS — E611 Iron deficiency: Secondary | ICD-10-CM

## 2025-01-02 DIAGNOSIS — E559 Vitamin D deficiency, unspecified: Secondary | ICD-10-CM | POA: Diagnosis not present

## 2025-01-02 DIAGNOSIS — G40909 Epilepsy, unspecified, not intractable, without status epilepticus: Secondary | ICD-10-CM

## 2025-01-02 DIAGNOSIS — N400 Enlarged prostate without lower urinary tract symptoms: Secondary | ICD-10-CM | POA: Diagnosis not present

## 2025-01-02 DIAGNOSIS — E782 Mixed hyperlipidemia: Secondary | ICD-10-CM | POA: Diagnosis not present

## 2025-01-02 DIAGNOSIS — I671 Cerebral aneurysm, nonruptured: Secondary | ICD-10-CM

## 2025-01-02 DIAGNOSIS — Z0000 Encounter for general adult medical examination without abnormal findings: Secondary | ICD-10-CM

## 2025-01-02 DIAGNOSIS — Z0001 Encounter for general adult medical examination with abnormal findings: Secondary | ICD-10-CM

## 2025-01-02 DIAGNOSIS — Z7189 Other specified counseling: Secondary | ICD-10-CM

## 2025-01-02 DIAGNOSIS — N052 Unspecified nephritic syndrome with diffuse membranous glomerulonephritis: Secondary | ICD-10-CM

## 2025-01-02 MED ORDER — RAMIPRIL 10 MG PO CAPS: 10.0000 mg | ORAL_CAPSULE | Freq: Two times a day (BID) | ORAL | 3 refills | Status: AC

## 2025-01-02 MED ORDER — TERAZOSIN HCL 5 MG PO CAPS: 5.0000 mg | ORAL_CAPSULE | Freq: Every day | ORAL | 3 refills | Status: AC

## 2025-01-02 MED ORDER — AMLODIPINE BESYLATE 10 MG PO TABS: 10.0000 mg | ORAL_TABLET | Freq: Every day | ORAL | 3 refills | Status: AC

## 2025-01-02 MED ORDER — NERVIVE NERVE RELIEF PO TABS: 1.0000 | ORAL_TABLET | Freq: Every day | ORAL | Status: AC

## 2025-01-02 NOTE — Progress Notes (Unsigned)
 " Ph: [phone] Fax: [phone]   Patient ID: Mark Holmes, male    DOB: [birth year], 89 y.o.   MRN: [id number]  This visit was conducted in person.  BP 130/80 (BP Location: Left Arm, Patient Position: Sitting, Cuff Size: Normal)   Pulse 83   Temp 97.6 F (36.4 C) (Oral)   Ht 5' 11 (1.803 m)   Wt 166 lb 12.8 oz (75.7 kg)   SpO2 96%   BMI 23.26 kg/m    CC: CPE  Subjective:   HPI: Mark Holmes is a 89 y.o. male presenting on 01/02/2025 for Annual Exam (Pt has no acute concerns/)   Saw health advisor 09/2024 for medicare wellness visit. Note reviewed.   No results found.  Flowsheet Row Office Visit from 01/02/2025 in [facility] [place]  PHQ-2 Total Score 1       10/19/2024    3:12 PM 08/29/2024    9:52 AM 05/29/2024   11:17 AM 12/27/2023    3:50 PM 10/21/2023   11:02 AM  Fall Risk   Falls in the past year? 0 0 0 0 0  Number falls in past yr: 0 0  0 0  Injury with Fall? 0  0   0  0   Risk for fall due to : No Fall Risks No Fall Risks  No Fall Risks No Fall Risks  Follow up Falls evaluation completed;Falls prevention discussed Falls evaluation completed  Falls evaluation completed Falls prevention discussed     Data saved with a previous flowsheet row definition    Acute/subacute stroke 06/2022 (punctate nonhemorrhagic stroke of posterior L lower midbrain presenting as sudden onset double vision and unsteadiness. MRI also showed remote lacunar infarcts of R thalamus and R cerebellum as well as 4mm PCOM aneurysm, referred to [facility] neurosurgery Dr Phil for circle of willis aneurysm - rec yearly monitoring at this time. Planned lifelong aspirin .   Known chronic severe predominantly sensory neuropathy. He he has been off dilantin  over a year with some improvement. Considering [facility] neuromuscular clinic evaluation.  CKD - followed by renal Dr Dominica. 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  BPH - he continues finasteride  and  terazosin  (see above).   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  S/p R CTS release surgery 06/21/2019 - with persistent hand paresthesias.    Seizure d/o - fully off AED.    Hyperlipidemia - atorvastatin  stopped due to myalgia.Cardiology started zetia  and rosuvastatin .   Periph neuropathy followed by neurology. Saw [facility] neurosurgery Katrina 09/2023 - not good candidate for lumbar decompression, to consider spinal cord stimulator. Lyrica  caused constipation and trouble with urination so he stopped it.  Backing off coffee helped lightheadedness.   New rash to right chest intermittently present, started 6 months ago. Currently present. Not itchy or tender    Preventative: Colon cancer screening - has never had colonoscopy. Reassuring iFOBs previously. Aged out. No recent blood in stool or bowel changes. Prostate - has seen uro (Dr. Ike at [facility]) - then established locally with Dr Gaston [facility]). H/o BPH with elevated PSA s/p TURP on finasteride  and terazosin .  Lung cancer screening - not eligible  Flu shot - does not receive. COVID vaccine - Pfizer 12/2019, 01/2020, booster 08/2020  Pneumovax 2011, prevnar-13 2015, prevnar-20 declined Tetanus shot Td 2011.   Shingrix - h/o shingles. Declines shingrix vaccine.  Advanced directives: has living will at  home. HCPOA would be daughter. Would be ok with CPR and temporary trial of life support if reversible condition. Does not want prolonged life support. Does not want copy in our chart - states daughter will be available. Seat belt use discussed.  Sunscreen use discussed. No changing moles.  Non smoker  Alcohol - none  Dentist - yearly Eye exam - yearly  Bowel - no constipation  Bladder - no incontinence  Caffeine: 1 cup/day, 2 excedrin/day   Married since 1959   2 grown daughters   Occupation: retired, was [employer])  Activity: walking dog,  stays active with grandson (swimming)  Diet: avoids carbs, good water, fruits/vegetables daily      Relevant past medical, surgical, family and social history reviewed and updated as indicated. Interim medical history since our last visit reviewed. Allergies and medications reviewed and updated. Outpatient Medications Prior to Visit  Medication Sig Dispense Refill   aspirin  EC 81 MG tablet Take 1 tablet (81 mg total) by mouth daily. Swallow whole. 30 tablet 12   aspirin -acetaminophen -caffeine (EXCEDRIN MIGRAINE) 250-250-65 MG per tablet Take 1 tablet by mouth 2 (two) times daily as needed.      Cholecalciferol (VITAMIN D3) 25 MCG (1000 UT) CAPS Take 1 capsule (1,000 Units total) by mouth every Monday, Wednesday, and Friday.     cyanocobalamin  (VITAMIN B12) 1000 MCG tablet Take 1 tablet (1,000 mcg total) by mouth every Monday, Wednesday, and Friday.     diphenhydramine -acetaminophen  (TYLENOL  PM) 25-500 MG TABS tablet Take 0.5 tablets by mouth at bedtime.     ezetimibe  (ZETIA ) 10 MG tablet Take 1 tablet (10 mg total) by mouth daily. 90 tablet 3   finasteride  (PROSCAR ) 5 MG tablet Take 1 tablet (5 mg total) by mouth daily. 90 tablet 0   folic acid  (FOLVITE ) 400 MCG tablet Take 1 tablet (0.4 mg total) by mouth daily.     Iron , Ferrous Sulfate , 325 (65 Fe) MG TABS Take 325 mg by mouth every Monday, Wednesday, and Friday.     Omega-3 Fatty Acids (FISH OIL ) 1000 MG CAPS Take 1 capsule (1,000 mg total) by mouth daily.     Polyethylene Glycol 400 (BLINK TEARS) 0.25 % SOLN Apply to eye.     rosuvastatin  (CRESTOR ) 5 MG tablet Take 1 tablet (5 mg total) by mouth daily. 90 tablet 3   XIIDRA 5 % SOLN Apply 1 drop to eye 2 (two) times daily.     amLODipine  (NORVASC ) 10 MG tablet Take 1 tablet (10 mg total) by mouth daily. 90 tablet 3   ramipril  (ALTACE ) 10 MG capsule Take 1 capsule (10 mg total) by mouth 2 (two) times daily. 180 capsule 1   terazosin  (HYTRIN ) 5 MG capsule Take 1 capsule (5 mg total) by mouth  daily. 90 capsule 1   Alpha-Lipoic Acid 300 MG CAPS Take 1 capsule (300 mg total) by mouth in the morning and at bedtime. (Patient not taking: Reported on 01/02/2025)     pregabalin  (LYRICA ) 50 MG capsule TAKE 1 CAPSULE(50 MG) BY MOUTH AT BEDTIME (Patient not taking: Reported on 01/02/2025) 30 capsule 6   No facility-administered medications prior to visit.     Per HPI unless specifically indicated in ROS section below Review of Systems  Constitutional:  Negative for activity change, appetite change, chills, fatigue, fever and unexpected weight change.  HENT:  Negative for hearing loss.   Eyes:  Negative for visual disturbance.  Respiratory:  Negative for cough, chest tightness, shortness of breath and  wheezing.   Cardiovascular:  Negative for chest pain, palpitations and leg swelling.  Gastrointestinal:  Negative for abdominal distention, abdominal pain, blood in stool, constipation, diarrhea, nausea and vomiting.  Genitourinary:  Negative for difficulty urinating and hematuria.  Musculoskeletal:  Negative for arthralgias, myalgias and neck pain.       No claudication  Skin:  Negative for rash.  Neurological:  Positive for dizziness (occ). Negative for seizures, syncope and headaches.  Hematological:  Negative for adenopathy. Does not bruise/bleed easily.  Psychiatric/Behavioral:  Negative for dysphoric mood. The patient is not nervous/anxious.     Objective:  BP 130/80 (BP Location: Left Arm, Patient Position: Sitting, Cuff Size: Normal)   Pulse 83   Temp 97.6 F (36.4 C) (Oral)   Ht 5' 11 (1.803 m)   Wt 166 lb 12.8 oz (75.7 kg)   SpO2 96%   BMI 23.26 kg/m   Wt Readings from Last 3 Encounters:  01/02/25 166 lb 12.8 oz (75.7 kg)  11/07/24 163 lb 6 oz (74.1 kg)  10/19/24 164 lb (74.4 kg)      Physical Exam Vitals and nursing note reviewed.  Constitutional:      General: He is not in acute distress.    Appearance: Normal appearance. He is well-developed. He is not  ill-appearing.  HENT:     Head: Normocephalic and atraumatic.     Right Ear: Hearing, tympanic membrane, ear canal and external ear normal.     Left Ear: Hearing, tympanic membrane, ear canal and external ear normal.     Mouth/Throat:     Mouth: Mucous membranes are moist.     Pharynx: Oropharynx is clear. No oropharyngeal exudate or posterior oropharyngeal erythema.  Eyes:     General: No scleral icterus.    Extraocular Movements: Extraocular movements intact.     Conjunctiva/sclera: Conjunctivae normal.     Pupils: Pupils are equal, round, and reactive to light.  Neck:     Thyroid : No thyroid  mass or thyromegaly.     Vascular: No carotid bruit.  Cardiovascular:     Rate and Rhythm: Normal rate and regular rhythm.     Pulses: Normal pulses.          Radial pulses are 2+ on the right side and 2+ on the left side.     Heart sounds: Murmur (2/6 systolic USB) heard.  Pulmonary:     Effort: Pulmonary effort is normal. No respiratory distress.     Breath sounds: Normal breath sounds. No wheezing, rhonchi or rales.  Abdominal:     General: Bowel sounds are normal. There is no distension.     Palpations: Abdomen is soft. There is no mass.     Tenderness: There is no abdominal tenderness. There is no guarding or rebound.     Hernia: No hernia is present.  Musculoskeletal:        General: Normal range of motion.     Cervical back: Normal range of motion and neck supple.     Right lower leg: No edema.     Left lower leg: No edema.  Lymphadenopathy:     Cervical: No cervical adenopathy.  Skin:    General: Skin is warm and dry.     Findings: Rash present.     Comments: Linear erythematous papules to right upper chest, non-pruritic   Neurological:     General: No focal deficit present.     Mental Status: He is alert and oriented to person, place, and time.  Psychiatric:        Mood and Affect: Mood normal.        Behavior: Behavior normal.        Thought Content: Thought content  normal.        Judgment: Judgment normal.       Results for orders placed or performed in visit on 12/26/24  IBC panel   Collection Time: 12/26/24  8:44 AM  Result Value Ref Range   Iron  97 42 - 165 ug/dL   Transferrin 827.9 (L) 212.0 - 360.0 mg/dL   Saturation Ratios 59.6 20.0 - 50.0 %   TIBC 240.8 (L) 250.0 - 450.0 mcg/dL  Ferritin   Collection Time: 12/26/24  8:44 AM  Result Value Ref Range   Ferritin 39.9 22.0 - 322.0 ng/mL  CBC with Differential/Platelet   Collection Time: 12/26/24  8:44 AM  Result Value Ref Range   WBC 9.0 4.0 - 10.5 K/uL   RBC 4.14 (L) 4.22 - 5.81 Mil/uL   Hemoglobin 12.3 (L) 13.0 - 17.0 g/dL   HCT 64.1 (L) 60.9 - 47.9 %   MCV 86.4 78.0 - 100.0 fl   MCHC 34.3 30.0 - 36.0 g/dL   RDW 86.3 88.4 - 84.4 %   Platelets 275.0 150.0 - 400.0 K/uL   Neutrophils Relative % 73.5 43.0 - 77.0 %   Lymphocytes Relative 15.3 12.0 - 46.0 %   Monocytes Relative 8.0 3.0 - 12.0 %   Eosinophils Relative 2.5 0.0 - 5.0 %   Basophils Relative 0.7 0.0 - 3.0 %   Neutro Abs 6.6 1.4 - 7.7 K/uL   Lymphs Abs 1.4 0.7 - 4.0 K/uL   Monocytes Absolute 0.7 0.1 - 1.0 K/uL   Eosinophils Absolute 0.2 0.0 - 0.7 K/uL   Basophils Absolute 0.1 0.0 - 0.1 K/uL  Parathyroid  hormone, intact (no Ca)   Collection Time: 12/26/24  8:44 AM  Result Value Ref Range   PTH 34 16 - 77 pg/mL  Microalbumin / creatinine urine ratio   Collection Time: 12/26/24  8:44 AM  Result Value Ref Range   Microalb, Ur 21.2 (H) 0.7 - 1.9 mg/dL   Creatinine,U 774.5 mg/dL   Microalb Creat Ratio 94.1 (H) 0.0 - 30.0 mg/g  VITAMIN D  25 Hydroxy (Vit-D Deficiency, Fractures)   Collection Time: 12/26/24  8:44 AM  Result Value Ref Range   VITD 39.75 30.00 - 100.00 ng/mL  Phosphorus   Collection Time: 12/26/24  8:44 AM  Result Value Ref Range   Phosphorus 3.1 2.3 - 4.6 mg/dL  Comprehensive metabolic panel with GFR   Collection Time: 12/26/24  8:44 AM  Result Value Ref Range   Sodium 141 135 - 145 mEq/L   Potassium  3.6 3.5 - 5.1 mEq/L   Chloride 105 96 - 112 mEq/L   CO2 29 19 - 32 mEq/L   Glucose, Bld 92 70 - 99 mg/dL   BUN 30 (H) 6 - 23 mg/dL   Creatinine, Ser 8.62 0.40 - 1.50 mg/dL   Total Bilirubin 0.4 0.2 - 1.2 mg/dL   Alkaline Phosphatase 73 39 - 117 U/L   AST 16 5 - 37 U/L   ALT 14 3 - 53 U/L   Total Protein 6.5 6.0 - 8.3 g/dL   Albumin 4.1 3.5 - 5.2 g/dL   GFR 53.50 (L) >39.99 mL/min   Calcium  9.3 8.4 - 10.5 mg/dL  Lipid panel   Collection Time: 12/26/24  8:44 AM  Result Value Ref Range   Cholesterol 101  28 - 200 mg/dL   Triglycerides 03.9 89.9 - 149.0 mg/dL   HDL 58.69 >60.99 mg/dL   VLDL 80.7 0.0 - 59.9 mg/dL   LDL Cholesterol 41 10 - 99 mg/dL   Total CHOL/HDL Ratio 2    NonHDL 59.80     Assessment & Plan:   Problem List Items Addressed This Visit     Encounter for general adult medical examination with abnormal findings - Primary (Chronic)   Preventative protocols reviewed and updated unless pt declined. Discussed healthy diet and lifestyle.       Advanced care planning/counseling discussion (Chronic)   Previously discussed      Mixed hyperlipidemia   Chronic, great control on low dose Crestor  and Zetia .  The ASCVD Risk score (Arnett DK, et al., 2019) failed to calculate for the following reasons:   The 2019 ASCVD risk score is only valid for ages 97 to 62   Risk score cannot be calculated because patient has a medical history suggesting prior/existing ASCVD   * - Cholesterol units were assumed       Relevant Medications   terazosin  (HYTRIN ) 5 MG capsule   ramipril  (ALTACE ) 10 MG capsule   amLODipine  (NORVASC ) 10 MG tablet   Essential hypertension   Chronic, stable. Continue current regimen.       Relevant Medications   terazosin  (HYTRIN ) 5 MG capsule   ramipril  (ALTACE ) 10 MG capsule   amLODipine  (NORVASC ) 10 MG tablet   Seizure disorder (HCC)   Stable period off AED. Dilantin  stopped >1 yr ago.      Vitamin D  deficiency   Latest levels normal -  continue vit D replacement MWF.       Peripheral neuropathy   Chronic issue Latest [facility] Clois 09/2023 - not surgical candidate  Sees [facility] neurology.  Lyrica  caused constipation - now off this.       Systolic murmur   Mild, stable. Continue to monitor. Known mild MR/AS      BPH (benign prostatic hyperplasia)   Chronic, stable period on finasteride  and terazosin .       Relevant Medications   terazosin  (HYTRIN ) 5 MG capsule   Unsteadiness on feet   Neuropathy related Completed outpatient PT 05/2022, again 2024      Nephrotic range proteinuria   Sees nephrology.       History of ischemic stroke   Continue lifelong aspirin .       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 Sees nephrology         Meds ordered this encounter  Medications   terazosin  (HYTRIN ) 5 MG capsule    Sig: Take 1 capsule (5 mg total) by mouth daily.    Dispense:  90 capsule    Refill:  3   ramipril  (ALTACE ) 10 MG capsule    Sig: Take 1 capsule (10 mg total) by mouth 2 (two) times daily.    Dispense:  180 capsule    Refill:  3   amLODipine  (NORVASC ) 10 MG tablet    Sig: Take 1 tablet (10 mg total) by mouth daily.    Dispense:  90 tablet    Refill:  3   Specialty Vitamins Products (NERVIVE NERVE RELIEF) TABS    Sig: Take 1 tablet by mouth daily.    No orders of the defined types were placed in this encounter.   Patient Instructions  Continue current medicines. Keep heart doctor and kidney doctor follow up visits Good to see you today   Return as needed or in 6 months for follow up visit   Follow up plan: Return in about 6 months (around 07/02/2025) for follow up visit.  Anton Blas, MD   "

## 2025-01-02 NOTE — Patient Instructions (Addendum)
 Continue current medicines. Keep heart doctor and kidney doctor follow up visits Good to see you today  Return as needed or in 6 months for follow up visit

## 2025-01-03 NOTE — Assessment & Plan Note (Signed)
 Stable period off AED. Dilantin  stopped >1 yr ago.

## 2025-01-03 NOTE — Assessment & Plan Note (Signed)
 Latest levels normal - continue vit D replacement MWF.

## 2025-01-03 NOTE — Assessment & Plan Note (Signed)
 Chronic, stable period on finasteride  and terazosin .

## 2025-01-03 NOTE — Assessment & Plan Note (Signed)
 Confirmed by biopsy 2023  Appreciate renal care

## 2025-01-03 NOTE — Assessment & Plan Note (Addendum)
 Neuropathy related Completed outpatient PT 05/2022, again 2024

## 2025-01-03 NOTE — Assessment & Plan Note (Signed)
 Mild, stable. Continue to monitor. Known mild MR/AS

## 2025-01-03 NOTE — Assessment & Plan Note (Signed)
 Yearly neurosurgery f/u (Dr Phil at [facility])

## 2025-01-03 NOTE — Assessment & Plan Note (Signed)
"  Chronic, stable.  Continue current regimen.  "

## 2025-01-03 NOTE — Assessment & Plan Note (Signed)
 Continue lifelong aspirin .

## 2025-01-03 NOTE — Assessment & Plan Note (Addendum)
 Chronic, great control on low dose Crestor  and Zetia .  The ASCVD Risk score (Arnett DK, et al., 2019) failed to calculate for the following reasons:   The 2019 ASCVD risk score is only valid for ages 52 to 42   Risk score cannot be calculated because patient has a medical history suggesting prior/existing ASCVD   * - Cholesterol units were assumed

## 2025-01-03 NOTE — Assessment & Plan Note (Signed)
 Sees nephrology.

## 2025-01-03 NOTE — Assessment & Plan Note (Signed)
 With low transferrin, ?CKD related  Continue oral iron  MWF Latest ferritin 39.9

## 2025-01-03 NOTE — Assessment & Plan Note (Signed)
 With latest GFR 46. Sees nephrology

## 2025-01-03 NOTE — Assessment & Plan Note (Signed)
 Preventative protocols reviewed and updated unless pt declined. Discussed healthy diet and lifestyle.

## 2025-01-03 NOTE — Assessment & Plan Note (Addendum)
 Chronic issue Latest [facility] Dr Clois 09/2023 - not surgical candidate  Sees [facility] neurology.  Lyrica  caused constipation - now off this.

## 2025-01-03 NOTE — Assessment & Plan Note (Signed)
"  Previously discussed    "

## 2025-01-09 ENCOUNTER — Encounter: Payer: Self-pay | Admitting: Family Medicine

## 2025-02-05 ENCOUNTER — Ambulatory Visit: Payer: Medicare Other | Admitting: Urology

## 2025-07-03 ENCOUNTER — Ambulatory Visit: Admitting: Family Medicine

## 2025-10-24 ENCOUNTER — Ambulatory Visit
